# Patient Record
Sex: Male | Born: 1960 | Race: White | Hispanic: No | Smoking: Current every day smoker
Health system: Southern US, Community
[De-identification: ages and names within clinical notes are randomized; demographics above are authoritative.]

## PROBLEM LIST (undated history)

## (undated) DIAGNOSIS — I509 Heart failure, unspecified: Secondary | ICD-10-CM

## (undated) DIAGNOSIS — K602 Anal fissure, unspecified: Secondary | ICD-10-CM

## (undated) DIAGNOSIS — Z860100 Personal history of colon polyps, unspecified: Secondary | ICD-10-CM

## (undated) DIAGNOSIS — R011 Cardiac murmur, unspecified: Secondary | ICD-10-CM

## (undated) DIAGNOSIS — R591 Generalized enlarged lymph nodes: Secondary | ICD-10-CM

## (undated) DIAGNOSIS — I10 Essential (primary) hypertension: Secondary | ICD-10-CM

## (undated) DIAGNOSIS — I4891 Unspecified atrial fibrillation: Secondary | ICD-10-CM

## (undated) DIAGNOSIS — L89159 Pressure ulcer of sacral region, unspecified stage: Secondary | ICD-10-CM

## (undated) HISTORY — DX: Personal history of colonic polyps: Z86.010

## (undated) HISTORY — DX: Essential (primary) hypertension: I10

## (undated) HISTORY — DX: Anal fissure, unspecified: K60.2

## (undated) HISTORY — DX: Cardiac murmur, unspecified: R01.1

## (undated) HISTORY — DX: Personal history of colon polyps, unspecified: Z86.0100

---

## 2008-12-13 HISTORY — PX: OTHER SURGICAL HISTORY: SHX169

## 2009-05-15 ENCOUNTER — Ambulatory Visit: Payer: Self-pay | Admitting: General Surgery

## 2009-05-27 ENCOUNTER — Ambulatory Visit: Payer: Self-pay | Admitting: Unknown Physician Specialty

## 2009-08-15 ENCOUNTER — Ambulatory Visit: Payer: Self-pay | Admitting: General Surgery

## 2009-08-21 ENCOUNTER — Ambulatory Visit: Payer: Self-pay | Admitting: General Surgery

## 2010-10-06 ENCOUNTER — Ambulatory Visit: Payer: Self-pay | Admitting: Specialist

## 2010-12-13 HISTORY — PX: HERNIA REPAIR: SHX51

## 2010-12-13 HISTORY — PX: COLONOSCOPY: SHX174

## 2011-06-03 ENCOUNTER — Ambulatory Visit: Payer: Self-pay | Admitting: General Surgery

## 2011-06-09 ENCOUNTER — Ambulatory Visit: Payer: Self-pay | Admitting: General Surgery

## 2011-06-10 LAB — PATHOLOGY REPORT

## 2013-01-17 ENCOUNTER — Ambulatory Visit: Payer: Self-pay | Admitting: Family Medicine

## 2013-02-26 ENCOUNTER — Ambulatory Visit: Payer: Self-pay | Admitting: Internal Medicine

## 2013-02-26 LAB — COMPREHENSIVE METABOLIC PANEL
Albumin: 3.7 g/dL (ref 3.4–5.0)
Alkaline Phosphatase: 90 U/L (ref 50–136)
Anion Gap: 5 — ABNORMAL LOW (ref 7–16)
BUN: 14 mg/dL (ref 7–18)
Bilirubin,Total: 0.8 mg/dL (ref 0.2–1.0)
Calcium, Total: 8.9 mg/dL (ref 8.5–10.1)
Chloride: 98 mmol/L (ref 98–107)
Co2: 31 mmol/L (ref 21–32)
Creatinine: 0.78 mg/dL (ref 0.60–1.30)
EGFR (African American): >60
EGFR (Non-African Amer.): >60
Glucose: 166 mg/dL — ABNORMAL HIGH (ref 65–99)
Osmolality: 272 (ref 275–301)
Potassium: 4.4 mmol/L (ref 3.5–5.1)
SGOT(AST): 83 U/L — ABNORMAL HIGH (ref 15–37)
SGPT (ALT): 127 U/L — ABNORMAL HIGH (ref 12–78)
Sodium: 134 mmol/L — ABNORMAL LOW (ref 136–145)
Total Protein: 7.5 g/dL (ref 6.4–8.2)

## 2013-02-26 LAB — CBC WITH DIFFERENTIAL/PLATELET
Basophil #: 0.1 10*3/uL (ref 0.0–0.1)
Basophil %: 1.1 %
Eosinophil #: 0.1 10*3/uL (ref 0.0–0.7)
Eosinophil %: 1 %
HCT: 46.5 % (ref 40.0–52.0)
HGB: 16 g/dL (ref 13.0–18.0)
Lymphocyte #: 1.4 10*3/uL (ref 1.0–3.6)
Lymphocyte %: 20.2 %
MCH: 34.2 pg — ABNORMAL HIGH (ref 26.0–34.0)
MCHC: 34.5 g/dL (ref 32.0–36.0)
MCV: 99 fL (ref 80–100)
Monocyte #: 0.6 x10 3/mm (ref 0.2–1.0)
Monocyte %: 8.8 %
Neutrophil #: 4.7 10*3/uL (ref 1.4–6.5)
Neutrophil %: 68.9 %
Platelet: 134 10*3/uL — ABNORMAL LOW (ref 150–440)
RBC: 4.69 10*6/uL (ref 4.40–5.90)
RDW: 12.3 % (ref 11.5–14.5)
WBC: 6.8 10*3/uL (ref 3.8–10.6)

## 2013-02-26 LAB — LIPID PANEL
Cholesterol: 194 mg/dL (ref 0–200)
HDL Cholesterol: 35 mg/dL — ABNORMAL LOW (ref 40–60)
Ldl Cholesterol, Calc: 88 mg/dL (ref 0–100)
Triglycerides: 353 mg/dL — ABNORMAL HIGH (ref 0–200)
VLDL Cholesterol, Calc: 71 mg/dL — ABNORMAL HIGH (ref 5–40)

## 2013-02-26 LAB — TSH: Thyroid Stimulating Horm: 1.12 u[IU]/mL

## 2013-07-04 ENCOUNTER — Encounter: Payer: Self-pay | Admitting: *Deleted

## 2014-05-03 ENCOUNTER — Ambulatory Visit: Payer: Self-pay | Admitting: Internal Medicine

## 2016-01-30 ENCOUNTER — Encounter: Payer: Self-pay | Admitting: Emergency Medicine

## 2016-01-30 ENCOUNTER — Emergency Department
Admission: EM | Admit: 2016-01-30 | Discharge: 2016-01-30 | Disposition: A | Discharge status: Home / Self Care | Payer: BC Managed Care – PPO | Attending: Emergency Medicine | Admitting: Emergency Medicine

## 2016-01-30 DIAGNOSIS — L03116 Cellulitis of left lower limb: Secondary | ICD-10-CM

## 2016-01-30 DIAGNOSIS — F17200 Nicotine dependence, unspecified, uncomplicated: Secondary | ICD-10-CM | POA: Diagnosis not present

## 2016-01-30 DIAGNOSIS — I10 Essential (primary) hypertension: Secondary | ICD-10-CM | POA: Diagnosis not present

## 2016-01-30 DIAGNOSIS — R2242 Localized swelling, mass and lump, left lower limb: Secondary | ICD-10-CM | POA: Diagnosis present

## 2016-01-30 LAB — CBC
HCT: 41 % (ref 40.0–52.0)
HEMOGLOBIN: 14.2 g/dL (ref 13.0–18.0)
MCH: 35.8 pg — AB (ref 26.0–34.0)
MCHC: 34.7 g/dL (ref 32.0–36.0)
MCV: 103.3 fL — ABNORMAL HIGH (ref 80.0–100.0)
PLATELETS: 116 10*3/uL — AB (ref 150–440)
RBC: 3.97 MIL/uL — AB (ref 4.40–5.90)
RDW: 15 % — ABNORMAL HIGH (ref 11.5–14.5)
WBC: 6.8 10*3/uL (ref 3.8–10.6)

## 2016-01-30 LAB — COMPREHENSIVE METABOLIC PANEL
ALBUMIN: 3.5 g/dL (ref 3.5–5.0)
ALK PHOS: 139 U/L — AB (ref 38–126)
ALT: 44 U/L (ref 17–63)
AST: 101 U/L — AB (ref 15–41)
Anion gap: 11 (ref 5–15)
CALCIUM: 8.7 mg/dL — AB (ref 8.9–10.3)
CHLORIDE: 86 mmol/L — AB (ref 101–111)
CO2: 29 mmol/L (ref 22–32)
Creatinine, Ser: 0.53 mg/dL — ABNORMAL LOW (ref 0.61–1.24)
GFR calc non Af Amer: >60 mL/min (ref >60)
GLUCOSE: 147 mg/dL — AB (ref 65–99)
Potassium: 4.2 mmol/L (ref 3.5–5.1)
SODIUM: 126 mmol/L — AB (ref 135–145)
Total Bilirubin: 0.9 mg/dL (ref 0.3–1.2)
Total Protein: 7.5 g/dL (ref 6.5–8.1)

## 2016-01-30 MED ORDER — CLINDAMYCIN PHOSPHATE 600 MG/50ML IV SOLN
600.0000 mg | Freq: Once | INTRAVENOUS | Status: AC
  Administered 2016-01-30: 600 mg via INTRAVENOUS
  Filled 2016-01-30 (×2): qty 50

## 2016-01-30 MED ORDER — SODIUM CHLORIDE 0.9 % IV BOLUS (SEPSIS)
1000.0000 mL | Freq: Once | INTRAVENOUS | Status: AC
  Administered 2016-01-30: 1000 mL via INTRAVENOUS

## 2016-01-30 MED ORDER — CLINDAMYCIN HCL 300 MG PO CAPS: 300.0000 mg | ORAL_CAPSULE | Freq: Four times a day (QID) | ORAL | Status: DC

## 2016-01-30 NOTE — ED Provider Notes (Signed)
[facility] Emergency Department Provider Note    ____________________________________________  Time seen: ~1535   I have reviewed the triage vital signs and the nursing notes.   HISTORY  Chief Complaint Leg Swelling   History limited by: Not Limited   HPI [name] is a 55 y.o. male who presents to the emergency department today because of concerns for left leg swelling and redness. The patient states that he first started noticing some redness roughly 2 weeks ago. Discovered after he scratched his leg. It is progressively gotten worse. His also noticed some swelling that has started to go down towards his foot. He states he has some mild pain however it is okay to walk on. He went to his primary care doctor 2 days ago and was put on an oral antibiotic. He states that he feels this is not started helping. He denies any fevers, nausea or vomiting.    Past Medical History  Diagnosis Date  . Hypertension   . Heart murmur   . Anal fissure   . Personal history of colonic polyps     There are no active problems to display for this patient.   Past Surgical History  Procedure Laterality Date  . Hernia repair  2012  . Colonoscopy  2012  . Larynx-amyloidosis-laser surgery   2010    No current outpatient prescriptions on file.  Allergies Review of patient's allergies indicates no known allergies.  No family history on file.  Social History Social History  Substance Use Topics  . Smoking status: Current Every Day Smoker -- 1.00 packs/day for 20 years  . Smokeless tobacco: Never Used  . Alcohol Use: Yes    Review of Systems  Constitutional: Negative for fever. Cardiovascular: Negative for chest pain. Respiratory: Negative for shortness of breath. Gastrointestinal: Negative for abdominal pain, vomiting and diarrhea. Skin: Positive for redness and swelling to left lower leg Neurological: Negative for headaches, focal weakness or  numbness.   10-point ROS otherwise negative.  ____________________________________________   PHYSICAL EXAM:  VITAL SIGNS: ED Triage Vitals  Enc Vitals Group     BP 01/30/16 1350 126/76 mmHg     Pulse Rate 01/30/16 1350 68     Resp 01/30/16 1350 20     Temp 01/30/16 1350 97.8 F (36.6 C)     Temp Source 01/30/16 1350 Oral     SpO2 01/30/16 1350 95 %     Weight 01/30/16 1350 340 lb (154.223 kg)     Height 01/30/16 1350  (1.93 m)   Constitutional: Alert and oriented. Well appearing and in no distress. Eyes: Conjunctivae are normal. PERRL. Normal extraocular movements. ENT   Head: Normocephalic and atraumatic.   Nose: No congestion/rhinnorhea.   Mouth/Throat: Mucous membranes are moist.   Neck: No stridor. Hematological/Lymphatic/Immunilogical: No cervical lymphadenopathy. Cardiovascular: Normal rate, regular rhythm.  No murmurs, rubs, or gallops. Respiratory: Normal respiratory effort without tachypnea nor retractions. Breath sounds are clear and equal bilaterally. No wheezes/rales/rhonchi. Gastrointestinal: Soft and nontender. No distention.  Genitourinary: Deferred Musculoskeletal: Normal range of motion in all extremities. No joint effusions.  No lower extremity tenderness nor edema. Neurologic:  Normal speech and language. No gross focal neurologic deficits are appreciated.  Skin:  Left leg with some redness and swelling to the left mid lower leg and left anterior foot. Minimally tender to palpation. No crepitus felt. Mild amount of oozing from site of wound mid shin. Psychiatric: Mood and affect are normal. Speech and behavior are normal.  Patient exhibits appropriate insight and judgment.  ____________________________________________    LABS (pertinent positives/negatives)  Labs Reviewed  CBC - Abnormal; Notable for the following:    RBC 3.97 (*)    MCV 103.3 (*)    MCH 35.8 (*)    RDW 15.0 (*)    Platelets 116 (*)    All other components within  normal limits  COMPREHENSIVE METABOLIC PANEL - Abnormal; Notable for the following:    Sodium 126 (*)    Chloride 86 (*)    Glucose, Bld 147 (*)    BUN <5 (*)    Creatinine, Ser 0.53 (*)    Calcium 8.7 (*)    AST 101 (*)    Alkaline Phosphatase 139 (*)    All other components within normal limits     ____________________________________________   EKG  None  ____________________________________________    RADIOLOGY  None  ____________________________________________   PROCEDURES  Procedure(s) performed: None  Critical Care performed: No  ____________________________________________   INITIAL IMPRESSION / ASSESSMENT AND PLAN / ED COURSE  Pertinent labs & imaging results that were available during my care of the patient were reviewed by me and considered in my medical decision making (see chart for details).  Patient presented to the emergency department today because of concerns for left leg redness and swelling. Exam is consistent with cellulitis. Patient has been on oral Keflex. I discussed and offered patient admission for IV antibiotics however patient declined wanting to try IV antibiotic here in the emergency department and switched to a medication with further coverage. Given the patient is without leukocytosis and vital signs are normal we can try switching oral antibiotics. Additionally I discussed with patient value of wound care center.  ____________________________________________   FINAL CLINICAL IMPRESSION(S) / ED DIAGNOSES  Final diagnoses:  Cellulitis of left lower extremity     Phineas Semen, MD 01/30/16 1757

## 2016-01-30 NOTE — ED Notes (Signed)
Pt sts leg swelling and reddness began x 2 wks ago after scratching leg.  Pt sts he saw PCP Wednesday and was given antibiotics.  Pt sts that redness and swelling has spread to L foot.

## 2016-01-30 NOTE — Discharge Instructions (Signed)
Please seek medical attention for any high fevers, chest pain, shortness of breath, change in behavior, persistent vomiting, bloody stool or any other new or concerning symptoms. ° ° °Cellulitis °Cellulitis is an infection of the skin and the tissue beneath it. The infected area is usually red and tender. Cellulitis occurs most often in the arms and lower legs.  °CAUSES  °Cellulitis is caused by bacteria that enter the skin through cracks or cuts in the skin. The most common types of bacteria that cause cellulitis are staphylococci and streptococci. °SIGNS AND SYMPTOMS  °· Redness and warmth. °· Swelling. °· Tenderness or pain. °· Fever. °DIAGNOSIS  °Your health care provider can usually determine what is wrong based on a physical exam. Blood tests may also be done. °TREATMENT  °Treatment usually involves taking an antibiotic medicine. °HOME CARE INSTRUCTIONS  °· Take your antibiotic medicine as directed by your health care provider. Finish the antibiotic even if you start to feel better. °· Keep the infected arm or leg elevated to reduce swelling. °· Apply a warm cloth to the affected area up to 4 times per day to relieve pain. °· Take medicines only as directed by your health care provider. °· Keep all follow-up visits as directed by your health care provider. °SEEK MEDICAL CARE IF:  °· You notice red streaks coming from the infected area. °· Your red area gets larger or turns dark in color. °· Your bone or joint underneath the infected area becomes painful after the skin has healed. °· Your infection returns in the same area or another area. °· You notice a swollen bump in the infected area. °· You develop new symptoms. °· You have a fever. °SEEK IMMEDIATE MEDICAL CARE IF:  °· You feel very sleepy. °· You develop vomiting or diarrhea. °· You have a general ill feeling (malaise) with muscle aches and pains. °  °This information is not intended to replace advice given to you by your health care provider. Make sure  you discuss any questions you have with your health care provider. °  °Document Released: 09/08/2005 Document Revised: 08/20/2015 Document Reviewed: 02/14/2012 °Elsevier Interactive Patient Education ©2016 [employer] ° °

## 2016-01-30 NOTE — ED Notes (Signed)
Pt presents with bilateral leg swelling and redness getting worse for one week.

## 2016-02-11 ENCOUNTER — Encounter: Payer: Self-pay | Admitting: *Deleted

## 2016-02-17 ENCOUNTER — Ambulatory Visit (INDEPENDENT_AMBULATORY_CARE_PROVIDER_SITE_OTHER): Payer: BC Managed Care – PPO | Admitting: General Surgery

## 2016-02-17 ENCOUNTER — Encounter: Payer: Self-pay | Admitting: General Surgery

## 2016-02-17 VITALS — BP 148/90 | HR 88 | Resp 16 | Ht 73.0 in | Wt 331.0 lb

## 2016-02-17 DIAGNOSIS — I8311 Varicose veins of right lower extremity with inflammation: Secondary | ICD-10-CM | POA: Diagnosis not present

## 2016-02-17 DIAGNOSIS — I8312 Varicose veins of left lower extremity with inflammation: Secondary | ICD-10-CM

## 2016-02-17 DIAGNOSIS — L97919 Non-pressure chronic ulcer of unspecified part of right lower leg with unspecified severity: Principal | ICD-10-CM

## 2016-02-17 DIAGNOSIS — I83019 Varicose veins of right lower extremity with ulcer of unspecified site: Secondary | ICD-10-CM | POA: Diagnosis not present

## 2016-02-17 DIAGNOSIS — I872 Venous insufficiency (chronic) (peripheral): Secondary | ICD-10-CM

## 2016-02-17 NOTE — Patient Instructions (Signed)
Una boot with compression. Follow up in 1 week nurse una boot change. Follow up with provider in 3 weeks for reevaluation. Advised to stop taking clindamycin

## 2016-02-17 NOTE — Progress Notes (Signed)
Patient ID: [name] RouteMichael J Mimbs, male   DOB: 23-Jan-1961, 55 y.o.   MRN: [id number]  Chief Complaint  Patient presents with  . Other    rigth leg cellulitis    HPI [name] is a 55 y.o. male here today for a evaluation of rigth leg cellulitis.Patient states he hit his right leg about two months ago. He also has had seepage of fluid from both legs, bilateral leg swelling. No history of DVT. I have reviewed the history of present illness with the patient. HPI  Past Medical History  Diagnosis Date  . Hypertension   . Heart murmur   . Anal fissure   . Personal history of colonic polyps     Past Surgical History  Procedure Laterality Date  . Hernia repair  2012  . Colonoscopy  2012  . Larynx-amyloidosis-laser surgery   2010    History reviewed. No pertinent family history.  Social History Social History  Substance Use Topics  . Smoking status: Current Every Day Smoker -- 1.00 packs/day for 20 years  . Smokeless tobacco: Never Used  . Alcohol Use: Yes    No Known Allergies  Current Outpatient Prescriptions  Medication Sig Dispense Refill  . amLODipine (NORVASC) 10 MG tablet     . benazepril (LOTENSIN) 40 MG tablet     . clindamycin (CLEOCIN) 300 MG capsule Take 1 capsule (300 mg total) by mouth 4 (four) times daily. 40 capsule 0  . montelukast (SINGULAIR) 10 MG tablet Take 10 mg by mouth at bedtime.    Marland Kitchen. nystatin cream (MYCOSTATIN)     . nystatin-triamcinolone ointment (MYCOLOG)     . omeprazole (PRILOSEC) 20 MG capsule Take 20 mg by mouth daily.    . sotalol (BETAPACE) 80 MG tablet     . XARELTO 20 MG TABS tablet     . cephALEXin (KEFLEX) 500 MG capsule Reported on 02/17/2016     No current facility-administered medications for this visit.    Review of Systems Review of Systems  Constitutional: Negative.   Respiratory: Negative.   Cardiovascular: Negative.     Blood pressure 148/90, pulse 88, resp. rate 16, height 6\' 1"  (1.854 m), weight 331 lb (150.141  kg).  Physical Exam Physical Exam  Constitutional: He is oriented to person, place, and time. He appears well-developed and well-nourished.  Eyes: Conjunctivae are normal. No scleral icterus.  Neck: Neck supple.  Cardiovascular: Normal rate, regular rhythm and normal heart sounds.   Pulses:      Dorsalis pedis pulses are 2+ on the right side, and 2+ on the left side.  Pulmonary/Chest: Effort normal and breath sounds normal.  Abdominal: Soft. Bowel sounds are normal.  Neurological: He is alert and oriented to person, place, and time.  Skin: Skin is warm and dry.  Skin color changes bilaterally from knee down. Weeping ulcers bilaterally mid calf. Pitting edema bilaterally.   Psychiatric: His behavior is normal.  2cm x 1 cm wide t irregularly shaped ulcer rght leg mid anterior  Data Reviewed Notes reviewed  Assessment    Stasis dermatitis bilateral, fairly severe. Stasis ulcer right leg  Findings discussed with pt. Once the process is under control will switch to use of compression stckings.   Plan    Una boot with compression. Follow up in weekly for nurse una boot change. Follow up with provider in 3 weeks for reevaluation. Advised to stop taking antibitics     PCP:  Corky DownsMasoud, [name] This information has been scribed by  Ples Specter CMA.    Jaquitta Dupriest G 02/17/2016, 4:45 PM

## 2016-02-18 ENCOUNTER — Ambulatory Visit: Payer: Self-pay | Admitting: General Surgery

## 2016-02-24 ENCOUNTER — Ambulatory Visit (INDEPENDENT_AMBULATORY_CARE_PROVIDER_SITE_OTHER): Payer: BC Managed Care – PPO | Admitting: *Deleted

## 2016-02-24 DIAGNOSIS — I83019 Varicose veins of right lower extremity with ulcer of unspecified site: Secondary | ICD-10-CM

## 2016-02-24 DIAGNOSIS — I8311 Varicose veins of right lower extremity with inflammation: Secondary | ICD-10-CM

## 2016-02-24 DIAGNOSIS — I8312 Varicose veins of left lower extremity with inflammation: Secondary | ICD-10-CM

## 2016-02-24 DIAGNOSIS — I872 Venous insufficiency (chronic) (peripheral): Secondary | ICD-10-CM

## 2016-02-24 DIAGNOSIS — L97919 Non-pressure chronic ulcer of unspecified part of right lower leg with unspecified severity: Principal | ICD-10-CM

## 2016-02-24 NOTE — Progress Notes (Signed)
The patient came in today for unna boot dressing change.  Both legs were washed with soap and water.  Unna boots, kerlix and coban applied.  Edema improving some.The area of concern is improving. Follow up as scheduled.   Skin color changes bilaterally from knee down. Weeping bilaterally mid calf. Pitting edema bilaterally.  2cm x 1 cm wide  irregularly shaped ulcer rght leg mid anterior unchanged.

## 2016-02-24 NOTE — Patient Instructions (Signed)
The patient is aware to call back for any questions or concerns.  

## 2016-03-02 ENCOUNTER — Ambulatory Visit (INDEPENDENT_AMBULATORY_CARE_PROVIDER_SITE_OTHER): Payer: BC Managed Care – PPO | Admitting: *Deleted

## 2016-03-02 DIAGNOSIS — I8311 Varicose veins of right lower extremity with inflammation: Secondary | ICD-10-CM | POA: Diagnosis not present

## 2016-03-02 DIAGNOSIS — I8312 Varicose veins of left lower extremity with inflammation: Secondary | ICD-10-CM

## 2016-03-02 DIAGNOSIS — I872 Venous insufficiency (chronic) (peripheral): Secondary | ICD-10-CM

## 2016-03-02 NOTE — Patient Instructions (Signed)
The patient is aware to call back for any questions or concerns.  

## 2016-03-02 NOTE — Progress Notes (Signed)
The patient came in today for unna boot dressing change.  Both legs were washed with soap and water.  Unna boots, kerlix and coban applied.  Edema improving.The area of concern has shown some improvment. Legs still weeping some but not as much. Follow up as scheduled.

## 2016-03-09 ENCOUNTER — Ambulatory Visit (INDEPENDENT_AMBULATORY_CARE_PROVIDER_SITE_OTHER): Payer: BC Managed Care – PPO | Admitting: General Surgery

## 2016-03-09 ENCOUNTER — Encounter: Payer: Self-pay | Admitting: General Surgery

## 2016-03-09 VITALS — BP 136/88 | HR 72 | Resp 16 | Ht 76.0 in | Wt 329.0 lb

## 2016-03-09 DIAGNOSIS — I8312 Varicose veins of left lower extremity with inflammation: Secondary | ICD-10-CM

## 2016-03-09 DIAGNOSIS — I8311 Varicose veins of right lower extremity with inflammation: Secondary | ICD-10-CM

## 2016-03-09 DIAGNOSIS — I872 Venous insufficiency (chronic) (peripheral): Secondary | ICD-10-CM

## 2016-03-09 NOTE — Progress Notes (Signed)
  Here today for follow up stasis ulcers and unna boots. Pain is mostly around left ankle, right leg is ok. I have reviewed the history of present illness with the patient.   Notable improvement since last evaluation with less seepage on both sides and clearing of some of the tiny ulcerations. Edema is likewise less. He does have significant nail atrophy and possbile fungal involvement, needs podiatry to evaluate.Contiunue unna boots. Follow up in 3 weeks.  PCP:  Corky DownsMasoud, [name] This information has been scribed by Dorathy DaftMarsha Hatch RN, BSN,BC.

## 2016-03-09 NOTE — Patient Instructions (Signed)
Patient to return in one week nurse  

## 2016-03-10 ENCOUNTER — Telehealth: Payer: Self-pay | Admitting: *Deleted

## 2016-03-10 ENCOUNTER — Encounter: Payer: Self-pay | Admitting: General Surgery

## 2016-03-10 NOTE — Telephone Encounter (Signed)
Patient is aware of instructions for appointment however he is going to call them to see if he can get a later time.

## 2016-03-10 NOTE — Telephone Encounter (Signed)
Message for patient to call the office.   Patient has been scheduled for an appointment with [facility] for 03-11-16 at 1:45 pm. This patient will need to take photo ID, insurance card, copay, and medications to this appointment.

## 2016-03-16 ENCOUNTER — Ambulatory Visit (INDEPENDENT_AMBULATORY_CARE_PROVIDER_SITE_OTHER): Payer: BC Managed Care – PPO | Admitting: *Deleted

## 2016-03-16 DIAGNOSIS — I8311 Varicose veins of right lower extremity with inflammation: Secondary | ICD-10-CM

## 2016-03-16 DIAGNOSIS — I8312 Varicose veins of left lower extremity with inflammation: Secondary | ICD-10-CM

## 2016-03-16 DIAGNOSIS — I872 Venous insufficiency (chronic) (peripheral): Secondary | ICD-10-CM

## 2016-03-16 NOTE — Patient Instructions (Signed)
The patient is aware to call back for any questions or concerns.  

## 2016-03-16 NOTE — Progress Notes (Signed)
The patient came in today for unna boot dressing change.  Both legs were washed with soap and water.  Unna boots, kerlix and coban applied.  Edema improving.The area of concern is improving. Drainage is less. Follow up as scheduled.

## 2016-03-25 ENCOUNTER — Ambulatory Visit (INDEPENDENT_AMBULATORY_CARE_PROVIDER_SITE_OTHER): Payer: BC Managed Care – PPO | Admitting: *Deleted

## 2016-03-25 DIAGNOSIS — I8312 Varicose veins of left lower extremity with inflammation: Secondary | ICD-10-CM

## 2016-03-25 DIAGNOSIS — I8311 Varicose veins of right lower extremity with inflammation: Secondary | ICD-10-CM | POA: Diagnosis not present

## 2016-03-25 DIAGNOSIS — I872 Venous insufficiency (chronic) (peripheral): Secondary | ICD-10-CM

## 2016-03-25 NOTE — Patient Instructions (Signed)
The patient is aware to call back for any questions or concerns.  

## 2016-03-25 NOTE — Progress Notes (Signed)
The patient came in today for unna boot dressing change.  Both legs were washed with soap and water.  Unna boots, kerlix and coban applied.  Edema improving.The area of concern is improving. Follow up as scheduled.

## 2016-03-30 ENCOUNTER — Encounter: Payer: Self-pay | Admitting: General Surgery

## 2016-03-30 ENCOUNTER — Ambulatory Visit (INDEPENDENT_AMBULATORY_CARE_PROVIDER_SITE_OTHER): Payer: BC Managed Care – PPO | Admitting: General Surgery

## 2016-03-30 VITALS — BP 130/72 | HR 76 | Resp 12 | Ht 76.0 in | Wt 331.0 lb

## 2016-03-30 DIAGNOSIS — I83019 Varicose veins of right lower extremity with ulcer of unspecified site: Secondary | ICD-10-CM

## 2016-03-30 DIAGNOSIS — L97919 Non-pressure chronic ulcer of unspecified part of right lower leg with unspecified severity: Secondary | ICD-10-CM

## 2016-03-30 DIAGNOSIS — I8312 Varicose veins of left lower extremity with inflammation: Secondary | ICD-10-CM

## 2016-03-30 DIAGNOSIS — I8311 Varicose veins of right lower extremity with inflammation: Secondary | ICD-10-CM | POA: Diagnosis not present

## 2016-03-30 DIAGNOSIS — I83029 Varicose veins of left lower extremity with ulcer of unspecified site: Secondary | ICD-10-CM | POA: Diagnosis not present

## 2016-03-30 DIAGNOSIS — I872 Venous insufficiency (chronic) (peripheral): Secondary | ICD-10-CM

## 2016-03-30 DIAGNOSIS — L97929 Non-pressure chronic ulcer of unspecified part of left lower leg with unspecified severity: Secondary | ICD-10-CM

## 2016-03-30 NOTE — Patient Instructions (Signed)
The patient is aware to call back for any questions or concerns.  

## 2016-03-30 NOTE — Progress Notes (Signed)
Here today for follow up stasis ulcers bilateral lower extremity. He states the left leg still hurts some. Overall his legs do feel better. I have reviewed the history of present illness with the patient.   Exam- Weeping of clear edema fluid is not seen on right leg. Left side with some seepage from lower third but markedly diminished from initial encounter. 6mm clean ulcer anterior rt leg. 2 by 2 cm irregular superficial ulcer left anterior leg. No signs of infection. Overall edema is significantly less. Slow but steady improvement. Continuew ith una boot and compression.  Culture taken form left posterior ankle region. Follow up nurse one week for unna boot and MD in 4 weeks.  PCP:  Masoud,[name]  This information has been scribed by Dorathy DaftMarsha Hatch RN, BSN,BC.

## 2016-03-31 ENCOUNTER — Encounter: Payer: Self-pay | Admitting: General Surgery

## 2016-04-05 LAB — ANAEROBIC AND AEROBIC CULTURE

## 2016-04-06 ENCOUNTER — Ambulatory Visit (INDEPENDENT_AMBULATORY_CARE_PROVIDER_SITE_OTHER): Payer: BC Managed Care – PPO | Admitting: *Deleted

## 2016-04-06 DIAGNOSIS — L97919 Non-pressure chronic ulcer of unspecified part of right lower leg with unspecified severity: Secondary | ICD-10-CM

## 2016-04-06 DIAGNOSIS — I83019 Varicose veins of right lower extremity with ulcer of unspecified site: Secondary | ICD-10-CM

## 2016-04-06 DIAGNOSIS — I83029 Varicose veins of left lower extremity with ulcer of unspecified site: Secondary | ICD-10-CM | POA: Diagnosis not present

## 2016-04-06 DIAGNOSIS — L97929 Non-pressure chronic ulcer of unspecified part of left lower leg with unspecified severity: Principal | ICD-10-CM

## 2016-04-06 MED ORDER — AMPICILLIN 500 MG PO CAPS: 500.0000 mg | ORAL_CAPSULE | Freq: Four times a day (QID) | ORAL | Status: DC

## 2016-04-06 NOTE — Patient Instructions (Signed)
The patient is aware to call back for any questions or concerns.  

## 2016-04-06 NOTE — Progress Notes (Signed)
The patient came in today for unna boot dressing change.  Both legs were washed with soap and water.   Edema remains.The area of concern is same. Dry gauze applied, ATB sent in per Dr Evette CristalSankar based on culture. Follow up as scheduled. Patient will call back on Thursday with an update and/or for unna boot.

## 2016-04-14 ENCOUNTER — Ambulatory Visit (INDEPENDENT_AMBULATORY_CARE_PROVIDER_SITE_OTHER): Payer: BC Managed Care – PPO | Admitting: General Surgery

## 2016-04-14 DIAGNOSIS — L97919 Non-pressure chronic ulcer of unspecified part of right lower leg with unspecified severity: Secondary | ICD-10-CM

## 2016-04-14 DIAGNOSIS — I83019 Varicose veins of right lower extremity with ulcer of unspecified site: Secondary | ICD-10-CM | POA: Diagnosis not present

## 2016-04-14 DIAGNOSIS — L97929 Non-pressure chronic ulcer of unspecified part of left lower leg with unspecified severity: Principal | ICD-10-CM

## 2016-04-14 DIAGNOSIS — I83029 Varicose veins of left lower extremity with ulcer of unspecified site: Secondary | ICD-10-CM | POA: Diagnosis not present

## 2016-04-14 NOTE — Progress Notes (Signed)
Here today for wound check. He states the areas are still draining. He states he has 2 days of antibiotics left.   Left, lower leg has a clean venous ulcer. Edema in both legs persist with a lot of peeling and dry skin in both feet. Left leg in the lower third is still draining clear fluid Overall, his condition is minimally improved. He seemed to do fairly well with una boot and compression At this point would like another opinion from vascular surgery Neomia DearUna boot was replaced today. Follow up after consult with vascular surgery.  Patient to see Dr. Gilda CreaseSchnier 04/15/16.        PCP:  Rueben BashMasoud, Jeved This information has been scribed by Dorathy DaftMarsha Hatch RN, BSN,BC.

## 2016-04-14 NOTE — Patient Instructions (Signed)
The patient is aware to call back for any questions or concerns.  

## 2016-04-15 ENCOUNTER — Encounter: Payer: Self-pay | Admitting: General Surgery

## 2016-04-17 ENCOUNTER — Encounter: Payer: Self-pay | Admitting: Emergency Medicine

## 2016-04-17 ENCOUNTER — Inpatient Hospital Stay
Admission: EM | Admit: 2016-04-17 | Discharge: 2016-04-19 | DRG: 593 | Disposition: A | Discharge status: Home / Self Care | Payer: BC Managed Care – PPO | Attending: Specialist | Admitting: Specialist

## 2016-04-17 DIAGNOSIS — Z7901 Long term (current) use of anticoagulants: Secondary | ICD-10-CM | POA: Diagnosis not present

## 2016-04-17 DIAGNOSIS — Z881 Allergy status to other antibiotic agents status: Secondary | ICD-10-CM

## 2016-04-17 DIAGNOSIS — I83019 Varicose veins of right lower extremity with ulcer of unspecified site: Secondary | ICD-10-CM | POA: Diagnosis present

## 2016-04-17 DIAGNOSIS — I10 Essential (primary) hypertension: Secondary | ICD-10-CM | POA: Diagnosis present

## 2016-04-17 DIAGNOSIS — K219 Gastro-esophageal reflux disease without esophagitis: Secondary | ICD-10-CM | POA: Diagnosis present

## 2016-04-17 DIAGNOSIS — R600 Localized edema: Secondary | ICD-10-CM | POA: Diagnosis present

## 2016-04-17 DIAGNOSIS — I890 Lymphedema, not elsewhere classified: Secondary | ICD-10-CM | POA: Diagnosis present

## 2016-04-17 DIAGNOSIS — F17210 Nicotine dependence, cigarettes, uncomplicated: Secondary | ICD-10-CM | POA: Diagnosis present

## 2016-04-17 DIAGNOSIS — L97929 Non-pressure chronic ulcer of unspecified part of left lower leg with unspecified severity: Secondary | ICD-10-CM | POA: Diagnosis present

## 2016-04-17 DIAGNOSIS — I83029 Varicose veins of left lower extremity with ulcer of unspecified site: Secondary | ICD-10-CM | POA: Diagnosis present

## 2016-04-17 DIAGNOSIS — S91311A Laceration without foreign body, right foot, initial encounter: Secondary | ICD-10-CM | POA: Diagnosis present

## 2016-04-17 DIAGNOSIS — Z79899 Other long term (current) drug therapy: Secondary | ICD-10-CM

## 2016-04-17 DIAGNOSIS — L97919 Non-pressure chronic ulcer of unspecified part of right lower leg with unspecified severity: Secondary | ICD-10-CM | POA: Diagnosis present

## 2016-04-17 DIAGNOSIS — L03115 Cellulitis of right lower limb: Secondary | ICD-10-CM

## 2016-04-17 DIAGNOSIS — E871 Hypo-osmolality and hyponatremia: Secondary | ICD-10-CM | POA: Diagnosis present

## 2016-04-17 DIAGNOSIS — Z6837 Body mass index (BMI) 37.0-37.9, adult: Secondary | ICD-10-CM | POA: Diagnosis not present

## 2016-04-17 DIAGNOSIS — E669 Obesity, unspecified: Secondary | ICD-10-CM | POA: Diagnosis present

## 2016-04-17 DIAGNOSIS — I482 Chronic atrial fibrillation: Secondary | ICD-10-CM | POA: Diagnosis present

## 2016-04-17 DIAGNOSIS — R011 Cardiac murmur, unspecified: Secondary | ICD-10-CM | POA: Diagnosis present

## 2016-04-17 DIAGNOSIS — R58 Hemorrhage, not elsewhere classified: Secondary | ICD-10-CM

## 2016-04-17 DIAGNOSIS — I878 Other specified disorders of veins: Secondary | ICD-10-CM | POA: Diagnosis present

## 2016-04-17 LAB — CBC
HEMATOCRIT: 39.4 % — AB (ref 40.0–52.0)
Hemoglobin: 13.6 g/dL (ref 13.0–18.0)
MCH: 37 pg — AB (ref 26.0–34.0)
MCHC: 34.5 g/dL (ref 32.0–36.0)
MCV: 107.2 fL — ABNORMAL HIGH (ref 80.0–100.0)
Platelets: 193 10*3/uL (ref 150–440)
RBC: 3.68 MIL/uL — ABNORMAL LOW (ref 4.40–5.90)
RDW: 13.4 % (ref 11.5–14.5)
WBC: 11.8 10*3/uL — ABNORMAL HIGH (ref 3.8–10.6)

## 2016-04-17 LAB — BASIC METABOLIC PANEL
Anion gap: 12 (ref 5–15)
BUN: 12 mg/dL (ref 6–20)
CALCIUM: 8.5 mg/dL — AB (ref 8.9–10.3)
CHLORIDE: 87 mmol/L — AB (ref 101–111)
CO2: 22 mmol/L (ref 22–32)
CREATININE: 0.59 mg/dL — AB (ref 0.61–1.24)
GFR calc non Af Amer: >60 mL/min (ref >60)
Glucose, Bld: 109 mg/dL — ABNORMAL HIGH (ref 65–99)
Potassium: 4.4 mmol/L (ref 3.5–5.1)
Sodium: 121 mmol/L — ABNORMAL LOW (ref 135–145)

## 2016-04-17 LAB — TSH: TSH: 1.83 u[IU]/mL (ref 0.350–4.500)

## 2016-04-17 MED ORDER — OXYCODONE HCL 5 MG PO TABS
5.0000 mg | ORAL_TABLET | ORAL | Status: DC | PRN
  Administered 2016-04-18 – 2016-04-19 (×5): 5 mg via ORAL
  Filled 2016-04-17 (×6): qty 1

## 2016-04-17 MED ORDER — MONTELUKAST SODIUM 10 MG PO TABS
10.0000 mg | ORAL_TABLET | Freq: Every day | ORAL | Status: DC
  Filled 2016-04-17: qty 1

## 2016-04-17 MED ORDER — SOTALOL HCL 80 MG PO TABS
120.0000 mg | ORAL_TABLET | Freq: Two times a day (BID) | ORAL | Status: DC
  Administered 2016-04-18 – 2016-04-19 (×2): 120 mg via ORAL
  Filled 2016-04-17: qty 1
  Filled 2016-04-17 (×3): qty 2

## 2016-04-17 MED ORDER — RIVAROXABAN 20 MG PO TABS
20.0000 mg | ORAL_TABLET | Freq: Every day | ORAL | Status: DC
  Administered 2016-04-19: 20 mg via ORAL
  Filled 2016-04-17 (×2): qty 1

## 2016-04-17 MED ORDER — LIDOCAINE-EPINEPHRINE (PF) 1 %-1:200000 IJ SOLN
INTRAMUSCULAR | Status: AC
  Filled 2016-04-17: qty 30

## 2016-04-17 MED ORDER — CLINDAMYCIN PHOSPHATE 600 MG/50ML IV SOLN
600.0000 mg | Freq: Once | INTRAVENOUS | Status: AC
  Administered 2016-04-17: 600 mg via INTRAVENOUS
  Filled 2016-04-17: qty 50

## 2016-04-17 MED ORDER — ONDANSETRON HCL 4 MG PO TABS: 4.0000 mg | ORAL_TABLET | Freq: Four times a day (QID) | ORAL | Status: DC | PRN

## 2016-04-17 MED ORDER — ONDANSETRON HCL 4 MG/2ML IJ SOLN
4.0000 mg | Freq: Four times a day (QID) | INTRAMUSCULAR | Status: DC | PRN
Start: 2016-04-17 — End: 2016-04-19

## 2016-04-17 MED ORDER — FUROSEMIDE 10 MG/ML IJ SOLN
40.0000 mg | Freq: Once | INTRAMUSCULAR | Status: AC
  Administered 2016-04-17: 40 mg via INTRAVENOUS
  Filled 2016-04-17: qty 4

## 2016-04-17 MED ORDER — PANTOPRAZOLE SODIUM 40 MG PO TBEC
40.0000 mg | DELAYED_RELEASE_TABLET | Freq: Every day | ORAL | Status: DC
  Administered 2016-04-19: 40 mg via ORAL
  Filled 2016-04-17 (×2): qty 1

## 2016-04-17 MED ORDER — BENAZEPRIL HCL 20 MG PO TABS
40.0000 mg | ORAL_TABLET | Freq: Every day | ORAL | Status: DC
  Administered 2016-04-19: 40 mg via ORAL
  Filled 2016-04-17 (×2): qty 2

## 2016-04-17 MED ORDER — MORPHINE SULFATE (PF) 2 MG/ML IV SOLN: 2.0000 mg | INTRAVENOUS | Status: DC | PRN

## 2016-04-17 MED ORDER — AMLODIPINE BESYLATE 10 MG PO TABS
10.0000 mg | ORAL_TABLET | Freq: Every day | ORAL | Status: DC
  Administered 2016-04-19: 10 mg via ORAL
  Filled 2016-04-17 (×2): qty 1

## 2016-04-17 MED ORDER — SODIUM CHLORIDE 0.9 % IV BOLUS (SEPSIS)
500.0000 mL | Freq: Once | INTRAVENOUS | Status: AC
  Administered 2016-04-17: 500 mL via INTRAVENOUS

## 2016-04-17 MED ORDER — SOTALOL HCL 80 MG PO TABS: 80.0000 mg | ORAL_TABLET | Freq: Every day | ORAL | Status: DC

## 2016-04-17 MED ORDER — ACETAMINOPHEN 325 MG PO TABS
650.0000 mg | ORAL_TABLET | Freq: Four times a day (QID) | ORAL | Status: DC | PRN
  Administered 2016-04-17: 650 mg via ORAL
  Filled 2016-04-17: qty 2

## 2016-04-17 MED ORDER — ACETAMINOPHEN 650 MG RE SUPP: 650.0000 mg | Freq: Four times a day (QID) | RECTAL | Status: DC | PRN

## 2016-04-17 NOTE — ED Notes (Signed)
Pt presents to ED with bleeding to right lower leg and heel.  Bilateral Unna boots in place. Pt due to see Dr. Graciela HusbandsKlein on Monday for follow up.  EMS out to the house to reinforce with coban wrap.  Bleeding stained to right heel. Pt is on Xarelto.  Swelling noted to bilateral feet and legs.  Pt states he has not had any blood draining from legs previously.

## 2016-04-17 NOTE — ED Provider Notes (Addendum)
[facility] Emergency Department Provider Note  ____________________________________________  Time seen: Approximately 7:01 PM  I have reviewed the triage vital signs and the nursing notes.   HISTORY  Chief Complaint Foot Swelling and Wound Check    HPI [name] is a 55 y.o. male with a history of chronic bilateral lymphedema, on Xarelto, presenting for right lateral foot bleeding. The patient denies any trauma but states that he began to have bleeding that he could not get to stop even with pressure. He denies any shortness of breath or lightheadedness. The patient just completed a course of antibiotics for bilateral lower extreme cellulitis, and feels that his legs are not better nor they worse since then. He is not having any systemic symptoms including fever, chills, nausea or vomiting.   Past Medical History  Diagnosis Date  . Hypertension   . Heart murmur   . Anal fissure   . Personal history of colonic polyps     There are no active problems to display for this patient.   Past Surgical History  Procedure Laterality Date  . Hernia repair  2012  . Colonoscopy  2012  . Larynx-amyloidosis-laser surgery   2010    Current Outpatient Rx  Name  Route  Sig  Dispense  Refill  . amLODipine (NORVASC) 10 MG tablet               . ampicillin (PRINCIPEN) 500 MG capsule   Oral   Take 1 capsule (500 mg total) by mouth 4 (four) times daily.   40 capsule   0   . benazepril (LOTENSIN) 40 MG tablet               . montelukast (SINGULAIR) 10 MG tablet   Oral   Take 10 mg by mouth at bedtime.         Marland Kitchen. nystatin cream (MYCOSTATIN)               . nystatin-triamcinolone ointment (MYCOLOG)               . omeprazole (PRILOSEC) 20 MG capsule   Oral   Take 20 mg by mouth daily.         . sotalol (BETAPACE) 80 MG tablet               . XARELTO 20 MG TABS tablet                 Dispense as written.      Allergies Review of patient's allergies indicates no known allergies.  History reviewed. No pertinent family history.  Social History Social History  Substance Use Topics  . Smoking status: Current Every Day Smoker -- 1.00 packs/day for 20 years  . Smokeless tobacco: Never Used  . Alcohol Use: Yes    Review of Systems Constitutional: No fever/chills. Eyes: No visual changes. ENT: No sore throat. No congestion or rhinorrhea. Cardiovascular: Denies chest pain. Denies palpitations. Respiratory: Denies shortness of breath.  No cough. Gastrointestinal: No abdominal pain.  No nausea, no vomiting.  No diarrhea.  No constipation. Genitourinary: Negative for dysuria. Musculoskeletal: Negative for back pain.Positive chronic bilateral lymphedema. Skin: Negative for rash. Positive open wound with significant bleeding. Neurological: Negative for headaches. No focal numbness, tingling or weakness.   10-point ROS otherwise negative.  ____________________________________________   PHYSICAL EXAM:  VITAL SIGNS: ED Triage Vitals  Enc Vitals Group     BP 04/17/16 1753 185/80 mmHg     Pulse  Rate 04/17/16 1753 78     Resp 04/17/16 1753 18     Temp 04/17/16 1753 98.3 F (36.8 C)     Temp Source 04/17/16 1753 Oral     SpO2 04/17/16 1753 97 %     Weight 04/17/16 1753 331 lb (150.141 kg)     Height 04/17/16 1753 6\' 4"  (1.93 m)     Head Cir --      Peak Flow --      Pain Score 04/17/16 1754 0     Pain Loc --      Pain Edu? --      Excl. in GC? --     Constitutional: Alert and oriented. Well appearing and in no acute distress. Answers questions appropriately. Eyes: Conjunctivae are normal.  EOMI. No scleral icterus. Head: Atraumatic. Nose: No congestion/rhinnorhea. Mouth/Throat: Mucous membranes are moist.  Neck: No stridor.  Supple.   Cardiovascular: Normal rate, .  Respiratory: Normal respiratory effort.   Gastrointestinal: Soft, nontender and nondistended.  No guarding or  rebound.  No peritoneal signs. Musculoskeletal: Positive bilateral significant lower extremity edema with significant tightening of the skin due to this. The patient has overlying scaly skin with chronic lymphedema changes including poor hair growth and thickening of the skin. The right lower extremity is particularly erythematous although it is not warm. On the lateral aspect of the right foot, the patient has a 1.5 cm open wound which appears to be a skin tear, with an arterial pulsatile bleeder. Neurologic:  A&Ox3.  Speech is clear.  Face and smile are symmetric.  EOMI.  Moves all extremities well. Skin:  Skin is warm, dry and intact. No rash noted. Psychiatric: Mood and affect are normal. Speech and behavior are normal.  Normal judgement.  ____________________________________________   LABS (all labs ordered are listed, but only abnormal results are displayed)  Labs Reviewed  CBC - Abnormal; Notable for the following:    WBC 11.8 (*)    RBC 3.68 (*)    HCT 39.4 (*)    MCV 107.2 (*)    MCH 37.0 (*)    All other components within normal limits  BASIC METABOLIC PANEL - Abnormal; Notable for the following:    Sodium 121 (*)    Chloride 87 (*)    Glucose, Bld 109 (*)    Creatinine, Ser 0.59 (*)    Calcium 8.5 (*)    All other components within normal limits   ____________________________________________  EKG  Not indicated ____________________________________________  RADIOLOGY  No results found.  ____________________________________________   PROCEDURES  Procedure(s) performed: None  Critical Care performed: No ____________________________________________   INITIAL IMPRESSION / ASSESSMENT AND PLAN / ED COURSE  Pertinent labs & imaging results that were available during my care of the patient were reviewed by me and considered in my medical decision making (see chart for details).  55 y.o. male with bilateral lower extremity lymphedema presenting with bleeding  from the right lateral foot. The patient's legs are concerning for possible infection although a changes on his legs could be from lymphedema alone. I will place some sutures around the laceration to stop bleeding and apply pressure for a full 20 minutes given his Xarelto use.  ----------------------------------------- 7:22 PM on 04/17/2016 -----------------------------------------  The patient's basic labs do show that his white blood cell count is elevated over his baseline, so bring him in for IV antibiotics to treat lower shotty cellulitis. His bleeding has significantly improved.  The patient has a history of  chronic hyponatremia, with previous sodiums as low as 126. Today, his sodium is 121, and he is asymptomatic, but this warrants admission for correction. He will be unable to receive fluids in high concentrations over rapid rate due to his lymphedema, as this would worsen his condition.  LACERATION REPAIR Performed by: Rockne Menghini Authorized by: Rockne Menghini Consent: Verbal consent obtained. Risks and benefits: risks, benefits and alternatives were discussed Consent given by: patient Patient identity confirmed: provided demographic data Prepped and Draped in normal sterile fashion Wound explored  Laceration Location: right lateral foot  Laceration Length: 1.2cm  No Foreign Bodies seen or palpated  Anesthesia: local infiltration  Local anesthetic: lidocaine 1% with epinephrine  Anesthetic total: 1.5 ml  Irrigation method: syringe Amount of cleaning: standard  Skin closure: 4-0 Prolene  Number of sutures: 3  Technique: simple interrupted  Patient tolerance: Patient tolerated the procedure well with no immediate complications.  Bleeding almost completely stopped after sutures placed.  ____________________________________________  FINAL CLINICAL IMPRESSION(S) / ED DIAGNOSES  Final diagnoses:  Laceration of right foot, initial encounter   Bleeding  Hyponatremia  Cellulitis of right leg      NEW MEDICATIONS STARTED DURING THIS VISIT:  New Prescriptions   No medications on file     Rockne Menghini, MD 04/17/16 1923  Rockne Menghini, MD 04/17/16 1924

## 2016-04-17 NOTE — H&P (Signed)
[facility] - [place] [facility] - Closed   PATIENT NAME: [name]    MR#:  [id number]  DATE OF BIRTH:  07/17/1961   DATE OF ADMISSION:  04/17/2016  PRIMARY CARE PHYSICIAN: Corky Downs, MD   REQUESTING/REFERRING PHYSICIAN: norman  CHIEF COMPLAINT:   Chief Complaint  Patient presents with  . Foot Swelling  . Wound Check    lower feet    HISTORY OF PRESENT ILLNESS:  [name]  is a 55 y.o. male with a known history of lymphedema who is presenting with right leg bleeding wound. States that he just completed a course of amoxicillin for cellulitis but has been having persistent edema. Noticed increased redness particularly in his right foot. Today while walking to the kitchen he had an acute episode of a bleeding wound. Denies fever, chills, further symptomatology present to the hospital given active bleeding. Emergency department staff was sutured wound and stopped bleeding. Found to have sodium of 121  PAST MEDICAL HISTORY:   Past Medical History  Diagnosis Date  . Hypertension   . Heart murmur   . Anal fissure   . Personal history of colonic polyps     PAST SURGICAL HISTORY:   Past Surgical History  Procedure Laterality Date  . Hernia repair  2012  . Colonoscopy  2012  . Larynx-amyloidosis-laser surgery   2010    SOCIAL HISTORY:   Social History  Substance Use Topics  . Smoking status: Current Every Day Smoker -- 1.00 packs/day for 20 years  . Smokeless tobacco: Never Used  . Alcohol Use: Yes    FAMILY HISTORY:   Family History  Problem Relation Age of Onset  . Hypertension Other     DRUG ALLERGIES:   Allergies  Allergen Reactions  . Clindamycin/Lincomycin Diarrhea    REVIEW OF SYSTEMS:  REVIEW OF SYSTEMS:  CONSTITUTIONAL: Denies fevers, chills, fatigue, weakness.  EYES: Denies blurred vision, double vision, or eye pain.  EARS, NOSE, THROAT: Denies tinnitus, ear pain, hearing loss.  RESPIRATORY: denies cough, shortness of  breath, wheezing  CARDIOVASCULAR: Denies chest pain, palpitations, positive edema.  GASTROINTESTINAL: Denies nausea, vomiting, diarrhea, abdominal pain.  GENITOURINARY: Denies dysuria, hematuria.  ENDOCRINE: Denies nocturia or thyroid problems. HEMATOLOGIC AND LYMPHATIC: Denies easy bruising or bleeding.  SKIN: Bilateral red rash, dry skin lower extremities otherwise Denies rash or lesions.  MUSCULOSKELETAL: Denies pain in neck, back, shoulder, knees, hips, or further arthritic symptoms.  NEUROLOGIC: Denies paralysis, paresthesias.  PSYCHIATRIC: Denies anxiety or depressive symptoms. Otherwise full review of systems performed by me is negative.   MEDICATIONS AT HOME:   Prior to Admission medications   Medication Sig Start Date End Date Taking? Authorizing Provider  ampicillin (PRINCIPEN) 500 MG capsule Take 1 capsule (500 mg total) by mouth 4 (four) times daily. Patient not taking: Reported on 04/17/2016 04/06/16   Kieth Brightly, MD  benazepril (LOTENSIN) 40 MG tablet  02/12/16   Historical Provider, MD  omeprazole (PRILOSEC) 20 MG capsule Take 20 mg by mouth daily.    Historical Provider, MD  sotalol (BETAPACE) 80 MG tablet  01/27/16   Historical Provider, MD  XARELTO 20 MG TABS tablet  02/02/16   Historical Provider, MD      VITAL SIGNS:  Blood pressure 185/80, pulse 78, temperature 98.3 F (36.8 C), temperature source Oral, resp. rate 18, height  (1.93 m), weight 150.141 kg (331 lb), SpO2 97 %.  PHYSICAL EXAMINATION:  VITAL SIGNS: Filed Vitals:   04/17/16 1753  BP: 185/80  Pulse: 78  Temp: 98.3 F (36.8 C)  Resp: 18   GENERAL:55 y.o.male currently in no acute distress.  HEAD: Normocephalic, atraumatic.  EYES: Pupils equal, round, reactive to light. Extraocular muscles intact. No scleral icterus.  MOUTH: Moist mucosal membrane. Dentition intact. No abscess noted.  EAR, NOSE, THROAT: Clear without exudates. No external lesions.  NECK: Supple. No thyromegaly. No  nodules. No JVD.  PULMONARY: Clear to ascultation, without wheeze rails or rhonci. No use of accessory muscles, Good respiratory effort. good air entry bilaterally CHEST: Nontender to palpation.  CARDIOVASCULAR: S1 and S2. Regular rate and rhythm. No murmurs, rubs, or gallops. 2+ edema. Pedal pulses 2+ bilaterally.  GASTROINTESTINAL: Soft, nontender, nondistended. No masses. Positive bowel sounds. No hepatosplenomegaly.  MUSCULOSKELETAL: No swelling, clubbing, or edema. Range of motion full in all extremities.  NEUROLOGIC: Cranial nerves II through XII are intact. No gross focal neurological deficits. Sensation intact. Reflexes intact.  SKIN: Right heel ulceration evidence of recent bleed, remainder skin erythematous hot to touch otherwise No ulceration, lesions, rashes, or cyanosis. Skin warm and dry. Turgor intact.  PSYCHIATRIC: Mood, affect within normal limits. The patient is awake, alert and oriented x 3. Insight, judgment intact.    LABORATORY PANEL:   CBC  Recent Labs Lab 04/17/16 1802  WBC 11.8*  HGB 13.6  HCT 39.4*  PLT 193   ------------------------------------------------------------------------------------------------------------------  Chemistries   Recent Labs Lab 04/17/16 1802  NA 121*  K 4.4  CL 87*  CO2 22  GLUCOSE 109*  BUN 12  CREATININE 0.59*  CALCIUM 8.5*   ------------------------------------------------------------------------------------------------------------------  Cardiac Enzymes No results for input(s): TROPONINI in the last 168 hours. ------------------------------------------------------------------------------------------------------------------  RADIOLOGY:  No results found.  EKG:   Orders placed or performed in visit on 08/15/09  . EKG 12-Lead    IMPRESSION AND PLAN:   55 year old Caucasian gentleman history of lymphedema presenting after right leg wounds or bleeding.  1. Hyponatremia of 121: Appears to be hypervolemic we'll  check echocardiogram provide diuresis, check urine sodium, urine osmolality, TSH would avoid giving further fluid at this time given evidence of edema 2. Cellulitis right lower extremity: Bleeding ulcer with surrounding erythema warm to touch has just finished a course of amoxicillin as previously been on clindamycin about 2-3 months ago but had issues with diarrhea at that time will place on ceftriaxone and follow culture data 3. GERD without esophagitis PPI therapy 4. Lymphedema with bleeding consult vascular surgery as well as wound therapy 5. Essential hypertension: Benazepril    All the records are reviewed and case discussed with ED provider. Management plans discussed with the patient, family and they are in agreement.  CODE STATUS: Full  TOTAL TIME TAKING CARE OF THIS PATIENT: 33 minutes.    Hower,  Mardi MainlandDavid K M.D on 04/17/2016 at 7:55 PM  Between 7am to 6pm - Pager - [phone]  After 6pm: House Pager: - [phone]  [facility]  Office  [phone]  CC: Primary care physician; Corky DownsMASOUD,[name], MD

## 2016-04-17 NOTE — Discharge Instructions (Signed)
Please make an appointment with your primary care physician for suture removal in 7-10 days. Please continue to monitor your wound, let your primary care doctor know if you see any signs of swelling, redness, discharge or pus, or if you develop fever, nausea or vomiting.    Laceration Care, Adult A laceration is a cut that goes through all layers of the skin. The cut also goes into the tissue that is right under the skin. Some cuts heal on their own. Others need to be closed with stitches (sutures), staples, skin adhesive strips, or wound glue. Taking care of your cut lowers your risk of infection and helps your cut to heal better. HOW TO TAKE CARE OF YOUR CUT For stitches or staples:  Keep the wound clean and dry.  If you were given a bandage (dressing), you should change it at least one time per day or as told by your doctor. You should also change it if it gets wet or dirty.  Keep the wound completely dry for the first 24 hours or as told by your doctor. After that time, you may take a shower or a bath. However, make sure that the wound is not soaked in water until after the stitches or staples have been removed.  Clean the wound one time each day or as told by your doctor:  Wash the wound with soap and water.  Rinse the wound with water until all of the soap comes off.  Pat the wound dry with a clean towel. Do not rub the wound.  After you clean the wound, put a thin layer of antibiotic ointment on it as told by your doctor. This ointment:  Helps to prevent infection.  Keeps the bandage from sticking to the wound.  Have your stitches or staples removed as told by your doctor. If your doctor used skin adhesive strips:   Keep the wound clean and dry.  If you were given a bandage, you should change it at least one time per day or as told by your doctor. You should also change it if it gets dirty or wet.  Do not get the skin adhesive strips wet. You can take a shower or a bath,  but be careful to keep the wound dry.  If the wound gets wet, pat it dry with a clean towel. Do not rub the wound.  Skin adhesive strips fall off on their own. You can trim the strips as the wound heals. Do not remove any strips that are still stuck to the wound. They will fall off after a while. If your doctor used wound glue:  Try to keep your wound dry, but you may briefly wet it in the shower or bath. Do not soak the wound in water, such as by swimming.  After you take a shower or a bath, gently pat the wound dry with a clean towel. Do not rub the wound.  Do not do any activities that will make you really sweaty until the skin glue has fallen off on its own.  Do not apply liquid, cream, or ointment medicine to your wound while the skin glue is still on.  If you were given a bandage, you should change it at least one time per day or as told by your doctor. You should also change it if it gets dirty or wet.  If a bandage is placed over the wound, do not let the tape for the bandage touch the skin glue.  Do  not pick at the glue. The skin glue usually stays on for 5-10 days. Then, it falls off of the skin. General Instructions  To help prevent scarring, make sure to cover your wound with sunscreen whenever you are outside after stitches are removed, after adhesive strips are removed, or when wound glue stays in place and the wound is healed. Make sure to wear a sunscreen of at least 30 SPF.  Take over-the-counter and prescription medicines only as told by your doctor.  If you were given antibiotic medicine or ointment, take or apply it as told by your doctor. Do not stop using the antibiotic even if your wound is getting better.  Do not scratch or pick at the wound.  Keep all follow-up visits as told by your doctor. This is important.  Check your wound every day for signs of infection. Watch for:  Redness, swelling, or pain.  Fluid, blood, or pus.  Raise (elevate) the injured  area above the level of your heart while you are sitting or lying down, if possible. GET HELP IF:  You got a tetanus shot and you have any of these problems at the injection site:  Swelling.  Very bad pain.  Redness.  Bleeding.  You have a fever.  A wound that was closed breaks open.  You notice a bad smell coming from your wound or your bandage.  You notice something coming out of the wound, such as wood or glass.  Medicine does not help your pain.  You have more redness, swelling, or pain at the site of your wound.  You have fluid, blood, or pus coming from your wound.  You notice a change in the color of your skin near your wound.  You need to change the bandage often because fluid, blood, or pus is coming from the wound.  You start to have a new rash.  You start to have numbness around the wound. GET HELP RIGHT AWAY IF:  You have very bad swelling around the wound.  Your pain suddenly gets worse and is very bad.  You notice painful lumps near the wound or on skin that is anywhere on your body.  You have a red streak going away from your wound.  The wound is on your hand or foot and you cannot move a finger or toe like you usually can.  The wound is on your hand or foot and you notice that your fingers or toes look pale or bluish.   This information is not intended to replace advice given to you by your health care provider. Make sure you discuss any questions you have with your health care provider.   Document Released: 05/17/2008 Document Revised: 04/15/2015 Document Reviewed: 11/25/2014 Elsevier Interactive Patient Education [employer]2016 [employer]

## 2016-04-18 ENCOUNTER — Inpatient Hospital Stay
Admit: 2016-04-18 | Discharge: 2016-04-18 | Disposition: A | Discharge status: Home / Self Care | Payer: BC Managed Care – PPO | Attending: Internal Medicine | Admitting: Internal Medicine

## 2016-04-18 LAB — BASIC METABOLIC PANEL
ANION GAP: 10 (ref 5–15)
BUN: 12 mg/dL (ref 6–20)
CO2: 27 mmol/L (ref 22–32)
Calcium: 8.4 mg/dL — ABNORMAL LOW (ref 8.9–10.3)
Chloride: 87 mmol/L — ABNORMAL LOW (ref 101–111)
Creatinine, Ser: 0.72 mg/dL (ref 0.61–1.24)
GFR calc Af Amer: >60 mL/min (ref >60)
Glucose, Bld: 144 mg/dL — ABNORMAL HIGH (ref 65–99)
POTASSIUM: 4.3 mmol/L (ref 3.5–5.1)
SODIUM: 124 mmol/L — AB (ref 135–145)

## 2016-04-18 LAB — CBC
HEMATOCRIT: 38.3 % — AB (ref 40.0–52.0)
Hemoglobin: 13.5 g/dL (ref 13.0–18.0)
MCH: 37.5 pg — ABNORMAL HIGH (ref 26.0–34.0)
MCHC: 35.3 g/dL (ref 32.0–36.0)
MCV: 106.2 fL — ABNORMAL HIGH (ref 80.0–100.0)
Platelets: 164 10*3/uL (ref 150–440)
RBC: 3.61 MIL/uL — ABNORMAL LOW (ref 4.40–5.90)
RDW: 13.5 % (ref 11.5–14.5)
WBC: 9.8 10*3/uL (ref 3.8–10.6)

## 2016-04-18 LAB — SODIUM, URINE, RANDOM: Sodium, Ur: 22 mmol/L

## 2016-04-18 LAB — ECHOCARDIOGRAM COMPLETE
HEIGHTINCHES: 76 in
Weight: 4942.4 oz

## 2016-04-18 LAB — OSMOLALITY, URINE: Osmolality, Ur: 207 mOsm/kg — ABNORMAL LOW (ref 300–900)

## 2016-04-18 MED ORDER — DEXTROSE 5 % IV SOLN
1.0000 g | INTRAVENOUS | Status: DC
  Administered 2016-04-18: 1 g via INTRAVENOUS
  Filled 2016-04-18 (×2): qty 10

## 2016-04-18 MED ORDER — DEXTROSE 5 % IV SOLN: 1.0000 g | Freq: Once | INTRAVENOUS | Status: DC

## 2016-04-18 NOTE — Progress Notes (Signed)
[employer] - [place] at [facility]   PATIENT NAME: [name]    MR#:  [id number]  DATE OF BIRTH:  28-Jun-1961  SUBJECTIVE:   Patient here due to bilateral lower extremity edema with underlying cellulitis and also a bleeding ulcer. Both legs are wrapped presently. Wife at bedside. No acute bleeding presently.  REVIEW OF SYSTEMS:    Review of Systems  Constitutional: Negative for fever and chills.  HENT: Negative for congestion and tinnitus.   Eyes: Negative for blurred vision and double vision.  Respiratory: Negative for cough, shortness of breath and wheezing.   Cardiovascular: Positive for leg swelling. Negative for chest pain, orthopnea and PND.  Gastrointestinal: Negative for nausea, vomiting, abdominal pain and diarrhea.  Genitourinary: Negative for dysuria and hematuria.  Neurological: Negative for dizziness, sensory change and focal weakness.  All other systems reviewed and are negative.   Nutrition: Heart Healthy Tolerating Diet: yes Tolerating PT: Await Eval.      DRUG ALLERGIES:   Allergies  Allergen Reactions  . Clindamycin/Lincomycin Diarrhea    VITALS:  Blood pressure 108/77, pulse 74, temperature 98.6 F (37 C), temperature source Oral, resp. rate 20, height  (1.93 m), weight 140.116 kg (308 lb 14.4 oz), SpO2 96 %.  PHYSICAL EXAMINATION:   Physical Exam  GENERAL:  55 y.o.-year-old obese patient lying in the bed in no acute distress.  EYES: Pupils equal, round, reactive to light and accommodation. No scleral icterus. Extraocular muscles intact.  HEENT: Head atraumatic, normocephalic. Oropharynx and nasopharynx clear.  NECK:  Supple, no jugular venous distention. No thyroid enlargement, no tenderness.  LUNGS: Normal breath sounds bilaterally, no wheezing, rales, rhonchi. No use of accessory muscles of respiration.  CARDIOVASCULAR: S1, S2 normal. No murmurs, rubs, or gallops.  ABDOMEN: Soft, nontender, nondistended. Bowel  sounds present. No organomegaly or mass.  EXTREMITIES: No cyanosis, clubbing, b/l +2-3 LE edema b/l.  LE wrapped in UNNA boot b/l.  NEUROLOGIC: Cranial nerves II through XII are intact. No focal Motor or sensory deficits b/l.   PSYCHIATRIC: The patient is alert and oriented x 3.    SKIN: No obvious rash, lesion, or ulcer.    LABORATORY PANEL:   CBC  Recent Labs Lab 04/18/16 0356  WBC 9.8  HGB 13.5  HCT 38.3*  PLT 164   ------------------------------------------------------------------------------------------------------------------  Chemistries   Recent Labs Lab 04/18/16 0356  NA 124*  K 4.3  CL 87*  CO2 27  GLUCOSE 144*  BUN 12  CREATININE 0.72  CALCIUM 8.4*   ------------------------------------------------------------------------------------------------------------------  Cardiac Enzymes No results for input(s): TROPONINI in the last 168 hours. ------------------------------------------------------------------------------------------------------------------  RADIOLOGY:  No results found.   ASSESSMENT AND PLAN:   55 year old male with past medical history of hypertension, history of chronic atrial fibrillation, bilateral chronic lower extremity edema, GERD who presented to the hospital due bleeding ulcer in the lower extremity with worsening lower extremity edema.  1. 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 Continue empiric antibiotics with Zosyn. - appreciate Vascular consult and no plans for surgical intervention. Follow up as outpatient.  - will get wound team consult and Podiatry consult.  - cont. Supportive care and likely will need UNNA boot likely.   2. Chronic a. Fib - rate controlled.  - cont. Sotalol.  Cont. Xarelto.   3. HTN - Norvasc, Benazepril.   4. GERD - cont. Protonix.     All the records are reviewed and case discussed with Care  Management/Social  Workerr. Management plans discussed with the patient, family and they are in agreement.  CODE STATUS: Full   DVT Prophylaxis: Xarelto  TOTAL TIME TAKING CARE OF THIS PATIENT: 30 minutes.   POSSIBLE D/C IN 1-2 DAYS, DEPENDING ON CLINICAL CONDITION.   Houston SirenSAINANI,VIVEK J M.D on 04/18/2016 at 12:56 PM  Between 7am to 6pm - Pager - [phone]  After 6pm go to [email or web address] - password [email or web address] [facility]  [facility]  Office  [phone]  CC: Primary care physician; Corky DownsMASOUD,[name], MD

## 2016-04-18 NOTE — Progress Notes (Signed)
*  PRELIMINARY RESULTS* Echocardiogram 2D Echocardiogram has been performed.  [name] 04/18/2016, 9:17 AM

## 2016-04-18 NOTE — Progress Notes (Signed)
RN spoke with Vascular Surgery and was instructed to change the patient's dressing on right leg/foot with 4X4 gauze, ABD, and kerlex [facility] come evaluate tomorrow.

## 2016-04-18 NOTE — Consult Note (Signed)
Reason for Consult: Chronic edema with cellulitis and foot ulcerations. Referring Physician: [facility]  [name] is an 55 y.o. male.  HPI: The patient relates a significant chronic history of edema in both lower extremities. He has been treated outpatient with Unna boots as well as a couple of rounds of antibiotics in the past few months. Relates chronic draining wounds on both legs. Yesterday he did notice some acute bleeding from his right foot and presented to the emergency department. States a couple of sutures were placed in the open area on the right foot to stop the bleeding. Admitted for antibiotics and treatment  Past Medical History  Diagnosis Date  . Hypertension   . Heart murmur   . Anal fissure   . Personal history of colonic polyps     Past Surgical History  Procedure Laterality Date  . Hernia repair  2012  . Colonoscopy  2012  . Larynx-amyloidosis-laser surgery   2010    Family History  Problem Relation Age of Onset  . Hypertension Other     Social History:  reports that he has been smoking.  He has never used smokeless tobacco. He reports that he drinks alcohol. He reports that he does not use illicit drugs.  Allergies:  Allergies  Allergen Reactions  . Clindamycin/Lincomycin Diarrhea    Medications:  Scheduled: . amLODipine  10 mg Oral Daily  . benazepril  40 mg Oral Daily  . montelukast  10 mg Oral QHS  . pantoprazole  40 mg Oral Daily  . rivaroxaban  20 mg Oral Daily  . sotalol  120 mg Oral Q12H    Results for orders placed or performed during the hospital encounter of 04/17/16 (from the past 48 hour(s))  CBC     Status: Abnormal   Collection Time: 04/17/16  6:02 PM  Result Value Ref Range   WBC 11.8 (H) 3.8 - 10.6 K/uL   RBC 3.68 (L) 4.40 - 5.90 MIL/uL   Hemoglobin 13.6 13.0 - 18.0 g/dL   HCT 39.4 (L) 40.0 - 52.0 %   MCV 107.2 (H) 80.0 - 100.0 fL   MCH 37.0 (H) 26.0 - 34.0 pg   MCHC 34.5 32.0 - 36.0 g/dL   RDW 13.4  11.5 - 14.5 %   Platelets 193 150 - 440 K/uL  Basic metabolic panel     Status: Abnormal   Collection Time: 04/17/16  6:02 PM  Result Value Ref Range   Sodium 121 (L) 135 - 145 mmol/L   Potassium 4.4 3.5 - 5.1 mmol/L   Chloride 87 (L) 101 - 111 mmol/L   CO2 22 22 - 32 mmol/L   Glucose, Bld 109 (H) 65 - 99 mg/dL   BUN 12 6 - 20 mg/dL   Creatinine, Ser 0.59 (L) 0.61 - 1.24 mg/dL   Calcium 8.5 (L) 8.9 - 10.3 mg/dL   GFR calc non Af Amer >60 >60 mL/min   GFR calc Af Amer >60 >60 mL/min    Comment: (NOTE) The eGFR has been calculated using the CKD EPI equation. This calculation has not been validated in all clinical situations. eGFR's persistently <60 mL/min signify possible Chronic Kidney Disease.    Anion gap 12 5 - 15  TSH     Status: None   Collection Time: 04/17/16  6:02 PM  Result Value Ref Range   TSH 1.830 0.350 - 4.500 uIU/mL  Sodium, urine, random     Status: None   Collection Time: 04/17/16  11:47 PM  Result Value Ref Range   Sodium, Ur 22 mmol/L  Osmolality, urine     Status: Abnormal   Collection Time: 04/17/16 11:47 PM  Result Value Ref Range   Osmolality, Ur 207 (L) 300 - 900 mOsm/kg  Basic metabolic panel     Status: Abnormal   Collection Time: 04/18/16  3:56 AM  Result Value Ref Range   Sodium 124 (L) 135 - 145 mmol/L   Potassium 4.3 3.5 - 5.1 mmol/L   Chloride 87 (L) 101 - 111 mmol/L   CO2 27 22 - 32 mmol/L   Glucose, Bld 144 (H) 65 - 99 mg/dL   BUN 12 6 - 20 mg/dL   Creatinine, Ser 0.72 0.61 - 1.24 mg/dL   Calcium 8.4 (L) 8.9 - 10.3 mg/dL   GFR calc non Af Amer >60 >60 mL/min   GFR calc Af Amer >60 >60 mL/min    Comment: (NOTE) The eGFR has been calculated using the CKD EPI equation. This calculation has not been validated in all clinical situations. eGFR's persistently <60 mL/min signify possible Chronic Kidney Disease.    Anion gap 10 5 - 15  CBC     Status: Abnormal   Collection Time: 04/18/16  3:56 AM  Result Value Ref Range   WBC 9.8 3.8 -  10.6 K/uL   RBC 3.61 (L) 4.40 - 5.90 MIL/uL   Hemoglobin 13.5 13.0 - 18.0 g/dL   HCT 38.3 (L) 40.0 - 52.0 %   MCV 106.2 (H) 80.0 - 100.0 fL   MCH 37.5 (H) 26.0 - 34.0 pg   MCHC 35.3 32.0 - 36.0 g/dL   RDW 13.5 11.5 - 14.5 %   Platelets 164 150 - 440 K/uL    No results found.  Review of Systems  Constitutional: Negative.   HENT: Negative.   Eyes: Negative.   Respiratory: Negative.   Cardiovascular: Negative.   Gastrointestinal: Negative.   Genitourinary: Negative.   Musculoskeletal:       Relates some chronic pain in his back. Also has had some pain in the feet when he walks due to the open sores.  Skin:       Chronic swelling and redness in both of his legs. Also relates some chronic draining sores on the legs. He has previously had treatment outpatient with Unna boots. Recent bleeding from an area on his right foot.  Neurological: Negative.   Endo/Heme/Allergies: Negative.   Psychiatric/Behavioral: Negative.    Blood pressure 115/86, pulse 60, temperature 98.1 F (36.7 C), temperature source Oral, resp. rate 16, height 6' 4"  (1.93 m), weight 140.116 kg (308 lb 14.4 oz), SpO2 97 %. Physical Exam  Cardiovascular:  DP and PT pulses are palpable but diminished secondary to swelling. Capillary filling time intact.  Musculoskeletal:  Stiff range of motion. Joints. There is pain on palpation around the feet where the open sores relocated. Muscle testing is deferred.  Neurological:  Epicritic sensations appear grossly intact bilateral.  Skin:  Significant bilateral lower extremity edema and erythema in the foot and leg. Ulcerations are noted on the distal leg bilateral with moderate to heavy drainage. A full thickness ulcerative areas noted on the posterior aspect of the left heel and ankle area measuring approximately 7 mm diameter with a couple of smaller areas 2-3 mm diameter. No purulence is noted. 2 sutures are noted in place along the lateral aspect of the right foot with no  drainage. A moderate amount of skin slough is noted around both  feet.    Assessment/Plan: Assessment: Chronic venous stasis with bilateral foot and leg ulcerations  Plan: Excisional debridement of some of the superficial skin slough as well as the ulcerative area on the posterior aspect of the left ankle region. Good healthy bleeding was noted with no purulence. Bandages were reapplied. Agree with wound care recommendations of whirlpool and more frequent treatments for the ulcerations. At this point no real sign of abscess or infection in the feet and I do not believe any additional debridements will be needed. Recommend that he continue to follow up with the wound care clinic as well as vascular for his venous stasis. We will follow outpatient if needed.  Durward Fortes 04/18/2016, 5:04 PM

## 2016-04-18 NOTE — Consult Note (Signed)
Reason for Consult: Chronic Venous stasis and ulcerations of lower extremities Referring Physician: Dr. Arman Filter is an 55 y.o. male.  HPI: Patient with chronic bilateral lower extremity venous stasis ulcers, edema and cellulitis presents with bleeding from an ulceration on the left leg. He has had outpatient care with PO antibiotics and UNNA boots. He states he notes the edema has improved however, the erythema and areas of ulceration are recurrent. Denies claudication or rest pain. He has a history of prolonged standing in the past and developed significant edema during that time.  Past Medical History  Diagnosis Date  . Hypertension   . Heart murmur   . Anal fissure   . Personal history of colonic polyps     Past Surgical History  Procedure Laterality Date  . Hernia repair  2012  . Colonoscopy  2012  . Larynx-amyloidosis-laser surgery   2010    Family History  Problem Relation Age of Onset  . Hypertension Other     Social History:  reports that he has been smoking.  He has never used smokeless tobacco. He reports that he drinks alcohol. He reports that he does not use illicit drugs.  Allergies:  Allergies  Allergen Reactions  . Clindamycin/Lincomycin Diarrhea    Medications: I have reviewed the patient's current medications.  Results for orders placed or performed during the hospital encounter of 04/17/16 (from the past 48 hour(s))  CBC     Status: Abnormal   Collection Time: 04/17/16  6:02 PM  Result Value Ref Range   WBC 11.8 (H) 3.8 - 10.6 K/uL   RBC 3.68 (L) 4.40 - 5.90 MIL/uL   Hemoglobin 13.6 13.0 - 18.0 g/dL   HCT 39.4 (L) 40.0 - 52.0 %   MCV 107.2 (H) 80.0 - 100.0 fL   MCH 37.0 (H) 26.0 - 34.0 pg   MCHC 34.5 32.0 - 36.0 g/dL   RDW 13.4 11.5 - 14.5 %   Platelets 193 150 - 440 K/uL  Basic metabolic panel     Status: Abnormal   Collection Time: 04/17/16  6:02 PM  Result Value Ref Range   Sodium 121 (L) 135 - 145 mmol/L   Potassium 4.4 3.5  - 5.1 mmol/L   Chloride 87 (L) 101 - 111 mmol/L   CO2 22 22 - 32 mmol/L   Glucose, Bld 109 (H) 65 - 99 mg/dL   BUN 12 6 - 20 mg/dL   Creatinine, Ser 0.59 (L) 0.61 - 1.24 mg/dL   Calcium 8.5 (L) 8.9 - 10.3 mg/dL   GFR calc non Af Amer >60 >60 mL/min   GFR calc Af Amer >60 >60 mL/min    Comment: (NOTE) The eGFR has been calculated using the CKD EPI equation. This calculation has not been validated in all clinical situations. eGFR's persistently <60 mL/min signify possible Chronic Kidney Disease.    Anion gap 12 5 - 15  TSH     Status: None   Collection Time: 04/17/16  6:02 PM  Result Value Ref Range   TSH 1.830 0.350 - 4.500 uIU/mL  Sodium, urine, random     Status: None   Collection Time: 04/17/16 11:47 PM  Result Value Ref Range   Sodium, Ur 22 mmol/L  Osmolality, urine     Status: Abnormal   Collection Time: 04/17/16 11:47 PM  Result Value Ref Range   Osmolality, Ur 207 (L) 300 - 900 mOsm/kg  Basic metabolic panel     Status: Abnormal  Collection Time: 04/18/16  3:56 AM  Result Value Ref Range   Sodium 124 (L) 135 - 145 mmol/L   Potassium 4.3 3.5 - 5.1 mmol/L   Chloride 87 (L) 101 - 111 mmol/L   CO2 27 22 - 32 mmol/L   Glucose, Bld 144 (H) 65 - 99 mg/dL   BUN 12 6 - 20 mg/dL   Creatinine, Ser 0.72 0.61 - 1.24 mg/dL   Calcium 8.4 (L) 8.9 - 10.3 mg/dL   GFR calc non Af Amer >60 >60 mL/min   GFR calc Af Amer >60 >60 mL/min    Comment: (NOTE) The eGFR has been calculated using the CKD EPI equation. This calculation has not been validated in all clinical situations. eGFR's persistently <60 mL/min signify possible Chronic Kidney Disease.    Anion gap 10 5 - 15  CBC     Status: Abnormal   Collection Time: 04/18/16  3:56 AM  Result Value Ref Range   WBC 9.8 3.8 - 10.6 K/uL   RBC 3.61 (L) 4.40 - 5.90 MIL/uL   Hemoglobin 13.5 13.0 - 18.0 g/dL   HCT 38.3 (L) 40.0 - 52.0 %   MCV 106.2 (H) 80.0 - 100.0 fL   MCH 37.5 (H) 26.0 - 34.0 pg   MCHC 35.3 32.0 - 36.0 g/dL   RDW  13.5 11.5 - 14.5 %   Platelets 164 150 - 440 K/uL    No results found.  Review of Systems  Constitutional: Negative for fever, chills and malaise/fatigue.  HENT: Negative.   Eyes: Negative.   Respiratory: Negative.   Cardiovascular: Negative.   Gastrointestinal: Negative.   Genitourinary: Negative.   Musculoskeletal:       Bilateral lower extremity edema  Skin:       Bilateral lower extremity venous stasis and cellulitis  Neurological: Negative.  Negative for weakness.  Endo/Heme/Allergies: Negative.   Psychiatric/Behavioral: Negative.    Blood pressure 108/77, pulse 74, temperature 98.6 F (37 C), temperature source Oral, resp. rate 20, height 6' 4"  (1.93 m), weight 140.116 kg (308 lb 14.4 oz), SpO2 96 %. Physical Exam  Constitutional: He is oriented to person, place, and time. He appears well-developed.  HENT:  Head: Normocephalic.  Neck: Normal range of motion. No thyromegaly present.  Cardiovascular: Normal rate and regular rhythm.   Respiratory: No respiratory distress. He has no wheezes. He has rales. He exhibits no tenderness.  GI: Soft. He exhibits no distension. There is no tenderness. There is no rebound and no guarding.  Musculoskeletal: He exhibits edema.  Bilateral lower extremity venous stasis with mild cellulitis and edema. Various ulcers with stages of healing.  Lymphadenopathy:    He has no cervical adenopathy.  Neurological: He is alert and oriented to person, place, and time.  Skin: Skin is warm. There is erythema.    Assessment/Plan: Chronic venous stasis, cellulitis and edema of lower extremities  Recommend: Wound care nurse consultLouretta Parma Boots for now  Outpatient whirlpool therapy, Consistent wound care clinic care, UNNA boots Follow Up outpatient with Dr. Lucky Cowboy for Venous and arterial studies   Jamesetta So A 04/18/2016, 10:24 AM

## 2016-04-19 LAB — BASIC METABOLIC PANEL
ANION GAP: 10 (ref 5–15)
BUN: 11 mg/dL (ref 6–20)
CALCIUM: 9 mg/dL (ref 8.9–10.3)
CO2: 28 mmol/L (ref 22–32)
Chloride: 87 mmol/L — ABNORMAL LOW (ref 101–111)
Creatinine, Ser: 0.77 mg/dL (ref 0.61–1.24)
Glucose, Bld: 123 mg/dL — ABNORMAL HIGH (ref 65–99)
POTASSIUM: 4.6 mmol/L (ref 3.5–5.1)
Sodium: 125 mmol/L — ABNORMAL LOW (ref 135–145)

## 2016-04-19 MED ORDER — SILVER NITRATE-POT NITRATE 75-25 % EX MISC
1.0000 "application " | CUTANEOUS | Status: DC | PRN
  Administered 2016-04-19: 1 via TOPICAL
  Filled 2016-04-19 (×3): qty 1

## 2016-04-19 NOTE — Progress Notes (Signed)
[employer] - [place] [facility]   PATIENT NAME: [name] BrasMichael [name]    MR#:  [id number]  DATE OF BIRTH:  1961-07-05  SUBJECTIVE:   Patient here due to bilateral lower extremity edema with underlying cellulitis and also a bleeding ulcer. Seen by wound care team and unna boots placed and then pt. Started to bleed from his RLE ulcer.  Surgicel placed on the ulcer and it stopped bleeding and now the left side has started to bleed.  Will apply surgicel to that wound too and monitor  REVIEW OF SYSTEMS:    Review of Systems  Constitutional: Negative for fever and chills.  HENT: Negative for congestion and tinnitus.   Eyes: Negative for blurred vision and double vision.  Respiratory: Negative for cough, shortness of breath and wheezing.   Cardiovascular: Positive for leg swelling. Negative for chest pain, orthopnea and PND.  Gastrointestinal: Negative for nausea, vomiting, abdominal pain and diarrhea.  Genitourinary: Negative for dysuria and hematuria.  Neurological: Negative for dizziness, sensory change and focal weakness.  All other systems reviewed and are negative.   Nutrition: Heart Healthy Tolerating Diet: yes Tolerating PT: Ambulatory.      DRUG ALLERGIES:   Allergies  Allergen Reactions  . Clindamycin/Lincomycin Diarrhea    VITALS:  Blood pressure 133/87, pulse 73, temperature 99.7 F (37.6 C), temperature source Oral, resp. rate 18, height 6\' 4"  (1.93 m), weight 140.116 kg (308 lb 14.4 oz), SpO2 98 %.  PHYSICAL EXAMINATION:   Physical Exam  GENERAL:  55 y.o.-year-old obese obese patient sitting up in chair in no acute distress.  EYES: Pupils equal, round, reactive to light and accommodation. No scleral icterus. Extraocular muscles intact.  HEENT: Head atraumatic, normocephalic. Oropharynx and nasopharynx clear.  NECK:  Supple, no jugular venous distention. No thyroid enlargement, no tenderness.  LUNGS: Normal breath sounds bilaterally, no wheezing,  rales, rhonchi. No use of accessory muscles of respiration.  CARDIOVASCULAR: S1, S2 normal. No murmurs, rubs, or gallops.  ABDOMEN: Soft, nontender, nondistended. Bowel sounds present. No organomegaly or mass.  EXTREMITIES: No cyanosis, clubbing, b/l +2-3 LE edema b/l.  LE wrapped in kerlex NEUROLOGIC: Cranial nerves II through XII are intact. No focal Motor or sensory deficits b/l.   PSYCHIATRIC: The patient is alert and oriented x 3.    SKIN: No obvious rash, lesion, or ulcer.    LABORATORY PANEL:   CBC  Recent Labs Lab 04/18/16 0356  WBC 9.8  HGB 13.5  HCT 38.3*  PLT 164   ------------------------------------------------------------------------------------------------------------------  Chemistries   Recent Labs Lab 04/19/16 0838  NA 125*  K 4.6  CL 87*  CO2 28  GLUCOSE 123*  BUN 11  CREATININE 0.77  CALCIUM 9.0   ------------------------------------------------------------------------------------------------------------------  Cardiac Enzymes No results for input(s): TROPONINI in the last 168 hours. ------------------------------------------------------------------------------------------------------------------  RADIOLOGY:  No results found.   ASSESSMENT AND PLAN:   55 year old male with past medical history of hypertension, history of chronic atrial fibrillation, bilateral chronic lower extremity edema, GERD who presented to the hospital due bleeding ulcer in the lower extremity with worsening lower extremity edema.  1. Chronic lower extremity lymphedema with bleeding ulcer-Chronic in nature without evidence of acute cellulitis presently. -Taken off IV antibiotics now. -Appreciate vascular surgery and podiatry input. Seen by wound care and Unna boots placed. Post Unna boots patient started to have bleeding from bilateral ulcers. We'll place Surgicel to see if it stops The bleeding. Appreciate podiatry input. If continues to have bleeding will need to have  further surgical/podiatry input.  2. Chronic a. Fib - rate controlled.  - cont. Sotalol.  Cont. Xarelto.   3. HTN - continue Benazepril.   4. GERD - cont. Protonix.   Possible DC home later today if the bleeding can be controlled and patient clinically stable.  All the records are reviewed and case discussed with Care Management/Social Workerr. Management plans discussed with the patient, family and they are in agreement.  CODE STATUS: Full   DVT Prophylaxis: Xarelto  TOTAL TIME TAKING CARE OF THIS PATIENT: 30 minutes.   POSSIBLE D/C IN 1-2 DAYS, DEPENDING ON CLINICAL CONDITION.   Houston Siren M.D on 04/19/2016 at 2:44 PM  Between 7am to 6pm - Pager - [phone]  After 6pm go to [email or web address] - password [email or web address] [facility]  [place]  [facility]  Office  [phone]  CC: Primary care physician; Corky Downs, MD

## 2016-04-19 NOTE — Progress Notes (Addendum)
Pt left leg bleeding lower calf area. Called Dr Cherlynn KaiserSainani and Dr Alberteen Spindleline. Dr cline recommended surgcel dressing like we did on the right. Will continue to monitor. Currently right side shows no signs of bleeding.  Removed una boot on the left small area lower calf noted to be ulcerated. Very minimal bleeding noted once dressing was taken down. Applied surgcel covered with gauze and applied una boot. Pt tolerated well discharge home pt understands and agrees with plan of care.

## 2016-04-19 NOTE — Progress Notes (Addendum)
Patients right foot bleeding during dressing change. Got order for silver nitrate sticks used x 2. Wound Nurse concerned that patient bleeding may reoccur and asked if patients 1 st dressing change be done at wound clinic as well as getting a bleeding time or change dosing on pt blood thiner.  Called MD orders received appointment made for Friday at 0930 pt agreeable to plan. Dosing change on blood thinner is not an option per Dr Cherlynn KaiserSainani. About 15 min after dressing change patient got up to use the bathroom and his foot started bleeding again wound nurse contacted asked to call doctor. Dr Cherlynn KaiserSainani made aware awaiting Dr Alberteen Spindleline to assess patient. Called OR got surgicel for Dr Alberteen Spindleline. Discharge on hold for now.

## 2016-04-19 NOTE — Care Management Note (Addendum)
Case Management Note  Patient Details  Name: [name] MRN: [id number] Date of Birth: [birth year]  Subjective/Objective:                  Met with patient and his girlfriend to discuss discharge planning. He is independent to the bathroom when I rounded. He has dressings to both lower extremities he and his girlfriend state are having trouble managing the home. Girlfriend works daily. They are interested in home health RN. His PCP is Dr. Lavera Guise. He uses [facility] for Rx. He denies difficulty obtaining meds.   Action/Plan: List of home health agencies left with patient. [place] nurse pending. Patient may be managed as outpatient. No home health needs. Patient follow up with [place] outpatient. Case closed.   Expected Discharge Date:                  Expected Discharge Plan:     In-House Referral:     Discharge planning Services  CM Consult  Post Acute Care Choice:  Home Health Choice offered to:  Patient (girlfriend)  DME Arranged:    DME Agency:     HH Arranged:    [place] Agency:     Status of Service:  In process, will continue to follow  Medicare Important Message Given:    Date Medicare IM Given:    Medicare IM give by:    Date Additional Medicare IM Given:    Additional Medicare Important Message give by:     If discussed at [place] of Stay Meetings, dates discussed:    Additional Comments:  Marshell Garfinkel, RN 04/19/2016, 9:39 AM

## 2016-04-19 NOTE — Progress Notes (Signed)
  Subjective: Patient seen. Had the wound on his right foot cleaned up some this morning and there was some bleeding. Some silver nitrate sticks were applied prior to clot off the bleeding area. This did work for a while but then when he got up to go to the bathroom and walked on the foot he noticed significant bleeding through his Unna boot and bandaging. Medicine requested that we recently the patient and evaluate this.  Objective: Vital signs in last 24 hours: Temp:  [98.1 F (36.7 C)-99.7 F (37.6 C)] 99.7 F (37.6 C) (05/08 0745) Pulse Rate:  [60-73] 73 (05/08 0745) Resp:  [16-18] 18 (05/08 0745) BP: (114-133)/(67-87) 133/87 mmHg (05/08 0745) SpO2:  [97 %-100 %] 98 % (05/08 0745) Last BM Date: 04/18/16  Intake/Output from previous day: 05/07 0701 - 05/08 0700 In: 240 [P.O.:240] Out: 0  Intake/Output this shift:    Significant acute and active bleeding noted from the lateral aspect of the right midfoot region. Just a small localized pinpoint area which appears to be a disrupted blood vessel.  Lab Results:   Recent Labs  04/17/16 1802 04/18/16 0356  WBC 11.8* 9.8  HGB 13.6 13.5  HCT 39.4* 38.3*  PLT 193 164   BMET  Recent Labs  04/18/16 0356 04/19/16 0838  NA 124* 125*  K 4.3 4.6  CL 87* 87*  CO2 27 28  GLUCOSE 144* 123*  BUN 12 11  CREATININE 0.72 0.77  CALCIUM 8.4* 9.0   PT/INR No results for input(s): LABPROT, INR in the last 72 hours. ABG No results for input(s): PHART, HCO3 in the last 72 hours.  Invalid input(s): PCO2, PO2  Studies/Results: No results found.  Anti-infectives: Anti-infectives    Start     Dose/Rate Route Frequency Ordered Stop   04/19/16 1000  cefTRIAXone (ROCEPHIN) 1 g in dextrose 5 % 50 mL IVPB  Status:  Discontinued     1 g 100 mL/hr over 30 Minutes Intravenous  Once 04/18/16 0132 04/18/16 1257   04/18/16 0145  cefTRIAXone (ROCEPHIN) 1 g in dextrose 5 % 50 mL IVPB  Status:  Discontinued     1 g 100 mL/hr over 30 Minutes  Intravenous Every 24 hours 04/18/16 0132 04/18/16 1257   04/17/16 1915  clindamycin (CLEOCIN) IVPB 600 mg     600 mg 100 mL/hr over 30 Minutes Intravenous  Once 04/17/16 1912 04/17/16 2046      Assessment/Plan: s/p * No surgery found * Assessment: Chronic venous stasis with ulcerations with active bleeding right foot   Plan: The Unna boot was removed and the wound assessed. Surgicel was applied to the bleeding area on the lateral aspect of the right foot and after 4-5 minutes of pressure with a gauze the bleeding had stopped. Instructions given for re-wrapping the foot with a second layer of Surgicel and then dry gauze. He will follow up with the wound care center later this week as scheduled as he is supposed to be discharged today  LOS: 2 days    [name] 04/19/2016

## 2016-04-19 NOTE — Consult Note (Signed)
WOC wound consult note Reason for Consult:Bilateral Unnas boots with chronic venous ulcers.  Recent bleeding episode to right lateral foot, resulting in sutures.  Bleeding in a different location on right lateral foot today with wound care. Silver nitrate x 2.   Wound type:Chronic venous wounds with heavily bleeding lesions.  Unable to cauterize with silver nitrate and pressure dressing.   Pressure Ulcer POA: N/A Measurement:Left dorsal leg 2 cm x 2 cm x 0.2 cm ruddy wound bed Blistered lesion to right anterior leg 3 cm x 3 cm x 0.1 cm  Right lateral foot with 1 cm suture line.  With cleansing, new area began to bleed.  Pressure applied for 15 mins and silver hydrofiber applied.  Unnas boot applied with extra padding.  Bleeding noted within 5 minutes.  Dressing is removed and pressure applied for 10 minutes with no improvement.  Silver nitrate cauterization x 2 and silver hydrofiber applied to wound bed.  Bleeding appeared to subside at that point.  Left leg cleansed and silver hydrofiber  Applied to anterior and posterior lower leg.  Unnas boot applied without incident to left leg.  Wound ZOX:WRUEAbed:Ruddy red Drainage (amount, consistency, odor) Moderate weeping to lower legs Heavy bleeding to opening on right lateral foot. Silver nitrate x 2 and wound begins to bleed again within 20 minutes.  Wound care RN was off the unit at that time.  Asked bedside RN to notify MD.  This area may need suturing as well.   Periwound: Chronic skin changes.  Serous weeping.  Dressing procedure/placement/frequency:Legs were cleansed with soap and water.  Aquacel Ag to nonintact lesions.  Wrap with zinc layer , secured with Coban..  Follow up appointment scheduled for Friday. MD notified to assess regarding bleeding to right foot.  Will not follow at this time.  Please re-consult if needed.  Maple HudsonKaren Johnell Bas RN BSN CWON Pager [phone]

## 2016-04-19 NOTE — Progress Notes (Signed)
Home meds returned to the pt

## 2016-04-20 ENCOUNTER — Ambulatory Visit: Payer: BC Managed Care – PPO

## 2016-04-20 NOTE — Discharge Summary (Signed)
[facility] - [place] at [facility]   PATIENT NAME: [name]    MR#:  [id number]  DATE OF BIRTH:  1961-04-02  DATE OF ADMISSION:  04/17/2016 ADMITTING PHYSICIAN: Wyatt Haste, MD  DATE OF DISCHARGE: 04/19/2016  2:50 PM  PRIMARY CARE PHYSICIAN: MASOUD,[name], MD    ADMISSION DIAGNOSIS:  Bleeding [R58] Hyponatremia [E87.1] Cellulitis of right leg [L03.115] Laceration of right foot, initial encounter [S91.311A]  DISCHARGE DIAGNOSIS:  Active Problems:   Hyponatremia   SECONDARY DIAGNOSIS:   Past Medical History  Diagnosis Date  . Hypertension   . Heart murmur   . Anal fissure   . Personal history of colonic polyps     HOSPITAL COURSE:   56 year old male with past medical history of hypertension, history of chronic atrial fibrillation, bilateral chronic lower extremity edema, GERD who presented to the hospital due bleeding ulcer in the lower extremity with worsening lower extremity edema.  1. 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 This has not been ruled out and patient has been taken off antibiotics. -Patient was seen by Vascular surgery and also podiatry. They recommended local wound care and therefore patient was seen by the wound nurse and Unna boots were placed. Post Unna boots placement patient started having bleeding from the ulcers. Surgicel was applied to the ulcers which stopped the bleeding. -Patient was discharged on the Unna boots and follow up at the wound center as outpatient.  2. Chronic a. Fib -he remained rate controlled while in the hospital.  - he will cont. Sotalol. he will Cont. Xarelto.   3. HTN - He will continue Benazepril.   4. GERD - He will cont. Omeprazole  DISCHARGE CONDITIONS:   Stable  CONSULTS OBTAINED:  Treatment Team:  Bertram Denver, MD  DRUG ALLERGIES:   Allergies  Allergen  Reactions  . Clindamycin/Lincomycin Diarrhea    DISCHARGE MEDICATIONS:   Discharge Medication List as of 04/19/2016  1:44 PM    CONTINUE these medications which have NOT CHANGED   Details  benazepril (LOTENSIN) 40 MG tablet Take 40 mg by mouth daily. , Starting 02/12/2016, Until Discontinued, Historical Med    naproxen sodium (ALEVE) 220 MG tablet Take 440 mg by mouth 2 (two) times daily with a meal., Until Discontinued, Historical Med    omeprazole (PRILOSEC) 20 MG capsule Take 20 mg by mouth daily as needed (for heartburn/indigestion.). , Until Discontinued, Historical Med    sotalol (BETAPACE) 80 MG tablet Take 120 mg by mouth 2 (two) times daily. , Starting 01/27/2016, Until Discontinued, Historical Med    XARELTO 20 MG TABS tablet Take 20 mg by mouth every morning. , Starting 02/02/2016, Until Discontinued, Historical Med      STOP taking these medications     amLODipine (NORVASC) 10 MG tablet      ampicillin (PRINCIPEN) 500 MG capsule      montelukast (SINGULAIR) 10 MG tablet          DISCHARGE INSTRUCTIONS:   DIET:  Cardiac diet  DISCHARGE CONDITION:  Stable  ACTIVITY:  Activity as tolerated  OXYGEN:  Home Oxygen: No.   Oxygen Delivery: room air  DISCHARGE LOCATION:  home   If you experience worsening of your admission symptoms, develop shortness of breath, life threatening emergency, suicidal or homicidal thoughts you must seek medical attention immediately by calling 911 or calling your MD immediately  if symptoms less severe.  You Must read complete instructions/literature along with all the possible adverse reactions/side effects for all the Medicines you take and that have been prescribed to you. Take any new Medicines after you have completely understood and accpet all the possible adverse reactions/side effects.   Please note  You were cared for by a hospitalist during your hospital stay. If you have any questions about your discharge medications or  the care you received while you were in the hospital after you are discharged, you can call the unit and asked to speak with the hospitalist on call if the hospitalist that took care of you is not available. Once you are discharged, your primary care physician will handle any further medical issues. Please note that NO REFILLS for any discharge medications will be authorized once you are discharged, as it is imperative that you return to your primary care physician (or establish a relationship with a primary care physician if you do not have one) for your aftercare needs so that they can reassess your need for medications and monitor your lab values.    Management plans discussed with the patient, family and they are in agreement.  CODE STATUS:  Code Status History    Date Active Date Inactive Code Status Order ID Comments User Context   04/17/2016  7:26 PM 04/19/2016  5:50 PM Full Code [id number]  Wyatt Hasteavid K Hower, MD ED      TOTAL TIME TAKING CARE OF THIS PATIENT: 40 minutes.    Houston SirenSAINANI,VIVEK J M.D on 04/20/2016 at 4:45 PM  Between 7am to 6pm - Pager - [phone]  After 6pm go to [email or web address] - password [email or web address] [facility]  [facility]  Office  [phone]  CC: Primary care physician; Corky DownsMASOUD,[name], MD

## 2016-04-23 ENCOUNTER — Encounter: Payer: BC Managed Care – PPO | Attending: Surgery | Admitting: Surgery

## 2016-04-23 DIAGNOSIS — I10 Essential (primary) hypertension: Secondary | ICD-10-CM | POA: Insufficient documentation

## 2016-04-23 DIAGNOSIS — I482 Chronic atrial fibrillation: Secondary | ICD-10-CM | POA: Diagnosis not present

## 2016-04-23 DIAGNOSIS — I87031 Postthrombotic syndrome with ulcer and inflammation of right lower extremity: Secondary | ICD-10-CM | POA: Diagnosis not present

## 2016-04-23 DIAGNOSIS — Z7901 Long term (current) use of anticoagulants: Secondary | ICD-10-CM | POA: Insufficient documentation

## 2016-04-23 DIAGNOSIS — I87032 Postthrombotic syndrome with ulcer and inflammation of left lower extremity: Secondary | ICD-10-CM | POA: Diagnosis not present

## 2016-04-23 DIAGNOSIS — I890 Lymphedema, not elsewhere classified: Secondary | ICD-10-CM | POA: Diagnosis present

## 2016-04-23 DIAGNOSIS — K219 Gastro-esophageal reflux disease without esophagitis: Secondary | ICD-10-CM | POA: Insufficient documentation

## 2016-04-23 DIAGNOSIS — L97811 Non-pressure chronic ulcer of other part of right lower leg limited to breakdown of skin: Secondary | ICD-10-CM | POA: Insufficient documentation

## 2016-04-23 DIAGNOSIS — L97521 Non-pressure chronic ulcer of other part of left foot limited to breakdown of skin: Secondary | ICD-10-CM | POA: Insufficient documentation

## 2016-04-23 DIAGNOSIS — F17218 Nicotine dependence, cigarettes, with other nicotine-induced disorders: Secondary | ICD-10-CM | POA: Insufficient documentation

## 2016-04-24 NOTE — Progress Notes (Addendum)
CALIB, WADHWA ([id number]) Visit Report for 04/23/2016 Allergy List Details Patient Name: DRAYDON, CLAIRMONT Date of Service: 04/23/2016 9:30 AM Medical Record Number: [id number] Patient Account Number: [id number] Date of Birth/Sex: January 18, 1961 (54 y.o. Male) Treating RN: Phillis Haggis Primary Care Physician: Corky Downs Other Clinician: Referring Physician: Corky Downs Treating Physician/Extender: Rudene Re in Treatment: 0 Allergies Active Allergies NKDA Allergy Notes Electronic Signature(s) Signed: 04/23/2016 5:12:54 PM By: Alejandro Mulling Entered By: Alejandro Mulling on 04/23/2016 10:09:05 Wulf, [name] ([id number]) -------------------------------------------------------------------------------- Arrival Information Details Patient Name: [name] Date of Service: 04/23/2016 9:30 AM Medical Record Number: [id number] Patient Account Number: [id number] Date of Birth/Sex: [birth year] (55 y.o. Male) Treating RN: Phillis Haggis Primary Care Physician: Corky Downs Other Clinician: Referring Physician: Corky Downs Treating Physician/Extender: Rudene Re in Treatment: 0 Visit Information Patient Arrived: Ambulatory Arrival Time: 10:03 Accompanied By: girlfriend Transfer Assistance: None Patient Identification Verified: Yes Secondary Verification Process Yes Completed: Patient Requires Transmission- No Based Precautions: Patient Has Alerts: Yes Patient Alerts: Patient on Blood Thinner [name] Electronic Signature(s) Signed: 04/23/2016 5:12:54 PM By: Alejandro Mulling Entered By: Alejandro Mulling on 04/23/2016 10:06:51 Storrs, [name] ([id number]) -------------------------------------------------------------------------------- Clinic Level of Care Assessment Details Patient Name: [name] Date of Service: 04/23/2016 9:30 AM Medical Record Number: [id number] Patient Account Number: [id number] Date of Birth/Sex:  01/11/1961 (55 y.o. Male) Treating RN: Phillis Haggis Primary Care Physician: Corky Downs Other Clinician: Referring Physician: Corky Downs Treating Physician/Extender: Rudene Re in Treatment: 0 Clinic Level of Care Assessment Items TOOL 2 Quantity Score X - Use when only an EandM is performed on the INITIAL visit 1 0 ASSESSMENTS - Nursing Assessment / Reassessment X - General Physical Exam (combine w/ comprehensive assessment (listed just 1 20 below) when performed on new pt. evals) X - Comprehensive Assessment (HX, ROS, Risk Assessments, Wounds Hx, etc.) 1 25 ASSESSMENTS - Wound and Skin Assessment / Reassessment  - Simple Wound Assessment / Reassessment - one wound 0 X - Complex Wound Assessment / Reassessment - multiple wounds 4 5  - Dermatologic / Skin Assessment (not related to wound area) 0 ASSESSMENTS - Ostomy and/or Continence Assessment and Care  - Incontinence Assessment and Management 0  - Ostomy Care Assessment and Management (repouching, etc.) 0 PROCESS - Coordination of Care  - Simple Patient / Family Education for ongoing care 0 X - Complex (extensive) Patient / Family Education for ongoing care 1 20 X - Staff obtains Chiropractor, Records, Test Results / Process Orders 1 10 X - Staff telephones HHA, Nursing Homes / Clarify orders / etc 1 10  - Routine Transfer to another Facility (non-emergent condition) 0  - Routine Hospital Admission (non-emergent condition) 0 X - New Admissions / Manufacturing engineer / Ordering NPWT, Apligraf, etc. 1 15  - Emergency Hospital Admission (emergent condition) 0 X - Simple Discharge Coordination 1 10 Dosch, Chin J. ([id number])  - Complex (extensive) Discharge Coordination 0 PROCESS - Special Needs  - Pediatric / Minor Patient Management 0  - Isolation Patient Management 0  - Hearing / Language / Visual special needs 0  - Assessment of Community assistance (transportation, D/C planning,  etc.) 0  - Additional assistance / Altered mentation 0  - Support Surface(s) Assessment (bed, cushion, seat, etc.) 0 INTERVENTIONS - Wound Cleansing / Measurement X - Wound Imaging (photographs - any number of wounds) 1 5  - Wound Tracing (instead of photographs) 0  - Simple Wound Measurement - one wound 0 X - Complex Wound Measurement -  multiple wounds 4 5 []  - Simple Wound Cleansing - one wound 0 X - Complex Wound Cleansing - multiple wounds 4 5 INTERVENTIONS - Wound Dressings []  - Small Wound Dressing one or multiple wounds 0 []  - Medium Wound Dressing one or multiple wounds 0 X - Large Wound Dressing one or multiple wounds 4 20 X - Application of Medications - injection 1 10 INTERVENTIONS - Miscellaneous []  - External ear exam 0 []  - Specimen Collection (cultures, biopsies, blood, body fluids, etc.) 0 []  - Specimen(s) / Culture(s) sent or taken to Lab for analysis 0 []  - Patient Transfer (multiple staff / Michiel Sites Lift / Similar devices) 0 []  - Simple Staple / Suture removal (25 or less) 0 []  - Complex Staple / Suture removal (26 or more) 0 Necaise, Wess J. ([id number]) []  - Hypo / Hyperglycemic Management (close monitor of Blood Glucose) 0 X - Ankle / Brachial Index (ABI) - do not check if billed separately 1 15 Has the patient been seen at the hospital within the last three years: Yes Total Score: 280 Level Of Care: New/Established - Level 5 Electronic Signature(s) Signed: 04/23/2016 5:12:54 PM By: Alejandro Mulling Entered By: Alejandro Mulling on 04/23/2016 12:12:56 Hagins, [name] ([id number]) -------------------------------------------------------------------------------- Encounter Discharge Information Details Patient Name: [name] Date of Service: 04/23/2016 9:30 AM Medical Record Number: [id number] Patient Account Number: [id number] Date of Birth/Sex: Mar 25, 1961 (55 y.o. Male) Treating RN: Phillis Haggis Primary Care Physician: Corky Downs Other  Clinician: Referring Physician: Corky Downs Treating Physician/Extender: Rudene Re in Treatment: 0 Encounter Discharge Information Items Discharge Pain Level: 0 Discharge Condition: Stable Ambulatory Status: Ambulatory Discharge Destination: Home Private Transportation: Auto Accompanied By: wife Schedule Follow-up Appointment: Yes Medication Reconciliation completed and No provided to Patient/Care Daziyah Cogan: Clinical Summary of Care: Electronic Signature(s) Signed: 04/23/2016 5:12:54 PM By: Alejandro Mulling Previous Signature: 04/23/2016 12:00:23 PM Version By: Gwenlyn Perking Entered By: Alejandro Mulling on 04/23/2016 12:00:56 Conyer, [name] ([id number]) -------------------------------------------------------------------------------- Lower Extremity Assessment Details Patient Name: [name] Date of Service: 04/23/2016 9:30 AM Medical Record Number: [id number] Patient Account Number: [id number] Date of Birth/Sex: 14-Jun-1961 (55 y.o. Male) Treating RN: Phillis Haggis Primary Care Physician: Corky Downs Other Clinician: Referring Physician: Corky Downs Treating Physician/Extender: Rudene Re in Treatment: 0 Edema Assessment Assessed: [Left: No] [Right: No] Edema: [Left: Yes] [Right: Yes] Calf Left: Right: Point of Measurement: 38 cm From Medial Instep 50.6 cm 51 cm Ankle Left: Right: Point of Measurement: 13 cm From Medial Instep 29 cm 30.5 cm Vascular Assessment Pulses: Posterior Tibial Dorsalis Pedis Palpable: [Left:No] [Right:No] Doppler: [Left:Monophasic] [Right:Multiphasic] Extremity colors, hair growth, and conditions: Extremity Color: [Left:Red] [Right:Red] Hair Growth on Extremity: [Left:Yes] [Right:Yes] Temperature of Extremity: [Left:Warm] [Right:Warm] Capillary Refill: [Left:> 3 seconds] [Right:> 3 seconds] Blood Pressure: Brachial: [Left:116] [Right:116] Dorsalis Pedis: 130 [Left:Dorsalis Pedis:  130] Ankle: Posterior Tibial: [Left:Posterior Tibial: 1.12] [Right:1.12] Toe Nail Assessment Left: Right: Thick: Yes Yes Discolored: Yes Yes Deformed: Yes Yes Improper Length and Hygiene: Yes Yes Electronic Signature(s) XAVION, MUSCAT ([id number]) Signed: 04/23/2016 5:12:54 PM By: Alejandro Mulling Entered By: Alejandro Mulling on 04/23/2016 11:04:16 Whinery, [name] ([id number]) -------------------------------------------------------------------------------- Multi Wound Chart Details Patient Name: [name] Date of Service: 04/23/2016 9:30 AM Medical Record Number: [id number] Patient Account Number: [id number] Date of Birth/Sex: Sep 18, 1961 (55 y.o. Male) Treating RN: Phillis Haggis Primary Care Physician: Corky Downs Other Clinician: Referring Physician: Corky Downs Treating Physician/Extender: Rudene Re in Treatment: 0 Vital Signs Height(in): 76 Pulse(bpm): 70 Weight(lbs): 320 Blood Pressure  116/80 (mmHg): Body Mass Index(BMI): 39 Temperature(F): 97.9 Respiratory Rate 20 (breaths/min): Photos: [1:No Photos] [2:No Photos] [3:No Photos] Wound Location: [1:Right Lower Leg - Circumfernential] [2:Right Lower Leg - Circumfernential] [3:Left Foot - Circumfernential] Wounding Event: [1:Gradually Appeared] [2:Gradually Appeared] [3:Gradually Appeared] Primary Etiology: [1:Venous Leg Ulcer] [2:Venous Leg Ulcer] [3:Vasculitis] Comorbid History: [1:Arrhythmia, Hypertension] [2:Arrhythmia, Hypertension] [3:Arrhythmia, Hypertension] Date Acquired: [1:12/14/2015] [2:12/14/2015] [3:12/14/2015] Weeks of Treatment: [1:0] [2:0] [3:0] Wound Status: [1:Open] [2:Open] [3:Open] Measurements L x W x D 17x26x0.1 [2:19x30.6x0.1] [3:9x27x0.1] (cm) Area (cm) : [1:347.146] [2:456.63] [3:190.852] Volume (cm) : [1:34.715] [2:45.663] [3:19.085] % Reduction in Area: [1:N/A] [2:0.00%] [3:N/A] % Reduction in Volume: N/A [2:0.00%] [3:N/A] Classification: [1:Partial  Thickness] [2:Partial Thickness] [3:Partial Thickness] Exudate Amount: [1:Large] [2:Large] [3:Large] Exudate Type: [1:Serous] [2:Serous] [3:Serous] Exudate Color: [1:amber] [2:amber] [3:amber] Wound Margin: [1:Flat and Intact] [2:Flat and Intact] [3:Flat and Intact] Granulation Amount: [1:Large (67-100%)] [2:Large (67-100%)] [3:Large (67-100%)] Granulation Quality: [1:Red] [2:Red, Pink] [3:Red, Pink] Necrotic Amount: [1:Small (1-33%)] [2:Small (1-33%)] [3:None Present (0%)] Exposed Structures: [1:Fascia: No Fat: No Tendon: No Muscle: No Joint: No Bone: No] [2:Fascia: No Fat: No Tendon: No Muscle: No Joint: No Bone: No] [3:Fascia: No Fat: No Tendon: No Muscle: No Joint: No Bone: No] Limited to Skin Limited to Skin Limited to Skin Breakdown Breakdown Breakdown Epithelialization: None None None Periwound Skin Texture: Edema: Yes Edema: Yes Edema: Yes Periwound Skin Maceration: Yes No Abnormalities Noted Maceration: Yes Moisture: Moist: Yes Moist: Yes Periwound Skin Color: Erythema: Yes Erythema: Yes Erythema: Yes Erythema Location: Circumferential Circumferential Circumferential Temperature: No Abnormality No Abnormality No Abnormality Tenderness on Yes Yes Yes Palpation: Wound Preparation: Ulcer Cleansing: Other: Ulcer Cleansing: Other: Ulcer Cleansing: Other: soap and water soap and water soap and water Topical Anesthetic Topical Anesthetic Topical Anesthetic Applied: None Applied: Other: lidocaine Applied: None % Assessment Notes: N/A N/A N/A Wound Number: 4 N/A N/A Photos: No Photos N/A N/A Wound Location: Right Foot - N/A N/A Circumfernential Wounding Event: Gradually Appeared N/A N/A Primary Etiology: Vasculitis N/A N/A Comorbid History: Arrhythmia, Hypertension N/A N/A Date Acquired: 12/14/2015 N/A N/A Weeks of Treatment: 0 N/A N/A Wound Status: Open N/A N/A Measurements L x W x D 10x28x0.1 N/A N/A (cm) Area (cm) : 219.911 N/A N/A Volume (cm) : 21.991 N/A N/A %  Reduction in Area: N/A N/A N/A % Reduction in Volume: N/A N/A N/A Classification: Partial Thickness N/A N/A Exudate Amount: Large N/A N/A Exudate Type: Serous N/A N/A Exudate Color: amber N/A N/A Wound Margin: Flat and Intact N/A N/A Granulation Amount: Large (67-100%) N/A N/A Granulation Quality: Red, Pink N/A N/A Necrotic Amount: None Present (0%) N/A N/A Exposed Structures: Fascia: No N/A N/A Fat: No Tendon: No Muscle: No Joint: No Bone: No Toepfer, Keilon J. ([id number]) Limited to Skin Breakdown Epithelialization: None N/A N/A Periwound Skin Texture: Edema: Yes N/A N/A Periwound Skin Maceration: Yes N/A N/A Moisture: Moist: Yes Periwound Skin Color: Erythema: Yes N/A N/A Erythema Location: Circumferential N/A N/A Temperature: No Abnormality N/A N/A Tenderness on Yes N/A N/A Palpation: Wound Preparation: Ulcer Cleansing: Other: N/A N/A soap and water Topical Anesthetic Applied: None Assessment Notes: 2 sutures in place N/A N/A Treatment Notes Electronic Signature(s) Signed: 04/23/2016 5:12:54 PM By: Alejandro MullingPinkerton, Debra Entered By: Alejandro MullingPinkerton, Debra on 04/23/2016 11:22:54 Reim, [name] ([id number]) -------------------------------------------------------------------------------- Multi-Disciplinary Care Plan Details Patient Name: [name] RouteAMERON, Sydney J. Date of Service: 04/23/2016 9:30 AM Medical Record Number: [id number] Patient Account Number: [id number] Date of Birth/Sex: Jul 22, 1961 [age] y.o. Male) Treating RN: Phillis HaggisPinkerton, Debi Primary Care Physician: Corky DownsMASOUD, [name] Other Clinician:  Referring Physician: Corky Downs Treating Physician/Extender: Rudene Re in Treatment: 0 Active Inactive Orientation to the Wound Care Program Nursing Diagnoses: Knowledge deficit related to the wound healing center program Goals: Patient/caregiver will verbalize understanding of the Wound Healing Center Program Date Initiated: 04/23/2016 Goal Status:  Active Interventions: Provide education on orientation to the wound center Notes: Pain, Acute or Chronic Nursing Diagnoses: Pain, acute or chronic: actual or potential Potential alteration in comfort, pain Goals: Patient will verbalize adequate pain control and receive pain control interventions during procedures as needed Date Initiated: 04/23/2016 Goal Status: Active Patient/caregiver will verbalize adequate pain control between visits Date Initiated: 04/23/2016 Goal Status: Active Interventions: Assess comfort goal upon admission Complete pain assessment as per visit requirements Notes: Wound/Skin Impairment Graffeo, Zannie Shela Commons ([id number]) Nursing Diagnoses: Impaired tissue integrity Knowledge deficit related to smoking impact on wound healing Knowledge deficit related to ulceration/compromised skin integrity Goals: Ulcer/skin breakdown will have a volume reduction of 30% by week 4 Date Initiated: 04/23/2016 Goal Status: Active Ulcer/skin breakdown will have a volume reduction of 50% by week 8 Date Initiated: 04/23/2016 Goal Status: Active Ulcer/skin breakdown will have a volume reduction of 80% by week 12 Date Initiated: 04/23/2016 Goal Status: Active Interventions: Assess ulceration(s) every visit Notes: Electronic Signature(s) Signed: 04/23/2016 5:12:54 PM By: Alejandro Mulling Entered By: Alejandro Mulling on 04/23/2016 12:14:35 Deyoe, [name] ([id number]) -------------------------------------------------------------------------------- Pain Assessment Details Patient Name: [name] Date of Service: 04/23/2016 9:30 AM Medical Record Number: [id number] Patient Account Number: [id number] Date of Birth/Sex: 09/09/1961 (55 y.o. Male) Treating RN: Phillis Haggis Primary Care Physician: Corky Downs Other Clinician: Referring Physician: Corky Downs Treating Physician/Extender: Rudene Re in Treatment: 0 Active Problems Location of Pain Severity  and Description of Pain Patient Has Paino Yes Site Locations Pain Location: Pain in Ulcers Rate the pain. Current Pain Level: 4 Character of Pain Describe the Pain: Tender Pain Management and Medication Current Pain Management: Electronic Signature(s) Signed: 04/23/2016 5:12:54 PM By: Alejandro Mulling Entered By: Alejandro Mulling on 04/23/2016 10:07:29 Spagnuolo, [name] ([id number]) -------------------------------------------------------------------------------- Patient/Caregiver Education Details Patient Name: [name] Date of Service: 04/23/2016 9:30 AM Medical Record Number: [id number] Patient Account Number: [id number] Date of Birth/Gender: 09-01-1961 (54 y.o. Male) Treating RN: Phillis Haggis Primary Care Physician: Corky Downs Other Clinician: Referring Physician: Corky Downs Treating Physician/Extender: Rudene Re in Treatment: 0 Education Assessment Education Provided To: Patient Education Topics Provided Wound/Skin Impairment: Handouts: Other: do not get wraps wet Electronic Signature(s) Signed: 04/23/2016 5:12:54 PM By: Alejandro Mulling Entered By: Alejandro Mulling on 04/23/2016 12:00:33 Coach, [name] ([id number]) -------------------------------------------------------------------------------- Wound Assessment Details Patient Name: [name] Date of Service: 04/23/2016 9:30 AM Medical Record Number: [id number] Patient Account Number: [id number] Date of Birth/Sex: 02-03-1961 (55 y.o. Male) Treating RN: Phillis Haggis Primary Care Physician: Corky Downs Other Clinician: Referring Physician: Corky Downs Treating Physician/Extender: Rudene Re in Treatment: 0 Wound Status Wound Number: 1 Primary Etiology: Venous Leg Ulcer Wound Location: Right Lower Leg - Wound Status: Open Circumfernential Comorbid History: Arrhythmia, Hypertension Wounding Event: Gradually Appeared Date Acquired: 12/14/2015 Weeks Of  Treatment: 0 Clustered Wound: No Wound Measurements Length: (cm) 17 Width: (cm) 26 Depth: (cm) 0.1 Area: (cm) 347.146 Volume: (cm) 34.715 % Reduction in Area: % Reduction in Volume: Epithelialization: None Tunneling: No Undermining: No Wound Description Classification: Partial Thickness Foul Odor Afte Wound Margin: Flat and Intact Exudate Amount: Large Exudate Type: Serous Exudate Color: amber r Cleansing: No Wound Bed Granulation Amount: Large (67-100%) Exposed Structure Granulation Quality:  Red Fascia Exposed: No Necrotic Amount: Small (1-33%) Fat Layer Exposed: No Necrotic Quality: Adherent Slough Tendon Exposed: No Muscle Exposed: No Joint Exposed: No Bone Exposed: No Limited to Skin Breakdown Periwound Skin Texture Texture Color No Abnormalities Noted: No No Abnormalities Noted: No Localized Edema: Yes Erythema: Yes Erythema Location: Circumferential Moisture No Abnormalities Noted: No Temperature / Pain Hypes, Jax J. ([id number]) Maceration: Yes Temperature: No Abnormality Moist: Yes Tenderness on Palpation: Yes Wound Preparation Ulcer Cleansing: Other: soap and water, Topical Anesthetic Applied: None Electronic Signature(s) Signed: 04/23/2016 5:12:54 PM By: Alejandro Mulling Entered By: Alejandro Mulling on 04/23/2016 11:06:52 Tischer, [name] ([id number]) -------------------------------------------------------------------------------- Wound Assessment Details Patient Name: [name] Date of Service: 04/23/2016 9:30 AM Medical Record Number: [id number] Patient Account Number: [id number] Date of Birth/Sex: 1961-04-12 (55 y.o. Male) Treating RN: Phillis Haggis Primary Care Physician: Corky Downs Other Clinician: Referring Physician: Corky Downs Treating Physician/Extender: Rudene Re in Treatment: 0 Wound Status Wound Number: 2 Primary Etiology: Venous Leg Ulcer Wound Location: Right Lower Leg - Wound Status:  Open Circumfernential Comorbid History: Arrhythmia, Hypertension Wounding Event: Gradually Appeared Date Acquired: 12/14/2015 Weeks Of Treatment: 0 Clustered Wound: No Wound Measurements Length: (cm) 19 Width: (cm) 30.6 Depth: (cm) 0.1 Area: (cm) 456.63 Volume: (cm) 45.663 % Reduction in Area: 0% % Reduction in Volume: 0% Epithelialization: None Tunneling: No Undermining: No Wound Description Classification: Partial Thickness Wound Margin: Flat and Intact Exudate Amount: Large Exudate Type: Serous Exudate Color: amber Wound Bed Granulation Amount: Large (67-100%) Exposed Structure Granulation Quality: Red, Pink Fascia Exposed: No Necrotic Amount: Small (1-33%) Fat Layer Exposed: No Necrotic Quality: Adherent Slough Tendon Exposed: No Muscle Exposed: No Joint Exposed: No Bone Exposed: No Limited to Skin Breakdown Periwound Skin Texture Texture Color No Abnormalities Noted: No No Abnormalities Noted: No Localized Edema: Yes Erythema: Yes Erythema Location: Circumferential Moisture No Abnormalities Noted: No Temperature / Pain Spurgin, Lyndell J. ([id number]) Temperature: No Abnormality Tenderness on Palpation: Yes Wound Preparation Ulcer Cleansing: Other: soap and water, Topical Anesthetic Applied: Other: lidocaine %, Electronic Signature(s) Signed: 04/23/2016 5:12:54 PM By: Alejandro Mulling Entered By: Alejandro Mulling on 04/23/2016 11:09:08 Isadore, [name] ([id number]) -------------------------------------------------------------------------------- Wound Assessment Details Patient Name: [name] Date of Service: 04/23/2016 9:30 AM Medical Record Number: [id number] Patient Account Number: [id number] Date of Birth/Sex: 1961-04-13 (55 y.o. Male) Treating RN: Ashok Cordia, Debi Primary Care Physician: Corky Downs Other Clinician: Referring Physician: Corky Downs Treating Physician/Extender: Rudene Re in Treatment: 0 Wound  Status Wound Number: 3 Primary Etiology: Vasculitis Wound Location: Left Foot - Circumfernential Wound Status: Open Wounding Event: Gradually Appeared Comorbid History: Arrhythmia, Hypertension Date Acquired: 12/14/2015 Weeks Of Treatment: 0 Clustered Wound: No Wound Measurements Length: (cm) 9 Width: (cm) 27 Depth: (cm) 0.1 Area: (cm) 190.852 Volume: (cm) 19.085 % Reduction in Area: % Reduction in Volume: Epithelialization: None Tunneling: No Undermining: No Wound Description Classification: Partial Thickness Wound Margin: Flat and Intact Exudate Amount: Large Exudate Type: Serous Exudate Color: amber Foul Odor After Cleansing: No Wound Bed Granulation Amount: Large (67-100%) Exposed Structure Granulation Quality: Red, Pink Fascia Exposed: No Necrotic Amount: None Present (0%) Fat Layer Exposed: No Tendon Exposed: No Muscle Exposed: No Joint Exposed: No Bone Exposed: No Limited to Skin Breakdown Periwound Skin Texture Texture Color No Abnormalities Noted: No No Abnormalities Noted: No Localized Edema: Yes Erythema: Yes Erythema Location: Circumferential Moisture No Abnormalities Noted: No Temperature / Pain Maceration: Yes Temperature: No Abnormality Gillen, Bubba J. ([id number]) Moist: Yes Tenderness on Palpation: Yes Wound Preparation Ulcer  Cleansing: Other: soap and water, Topical Anesthetic Applied: None Electronic Signature(s) Signed: 04/23/2016 5:12:54 PM By: Alejandro Mulling Entered By: Alejandro Mulling on 04/23/2016 11:11:48 Cura, [name] ([id number]) -------------------------------------------------------------------------------- Wound Assessment Details Patient Name: [name] Date of Service: 04/23/2016 9:30 AM Medical Record Number: [id number] Patient Account Number: [id number] Date of Birth/Sex: 01-11-1961 (55 y.o. Male) Treating RN: Phillis Haggis Primary Care Physician: Corky Downs Other Clinician: Referring  Physician: Corky Downs Treating Physician/Extender: Rudene Re in Treatment: 0 Wound Status Wound Number: 4 Primary Etiology: Vasculitis Wound Location: Right Foot - Circumfernential Wound Status: Open Wounding Event: Gradually Appeared Comorbid History: Arrhythmia, Hypertension Date Acquired: 12/14/2015 Weeks Of Treatment: 0 Clustered Wound: No Wound Measurements Length: (cm) 10 Width: (cm) 28 Depth: (cm) 0.1 Area: (cm) 219.911 Volume: (cm) 21.991 % Reduction in Area: % Reduction in Volume: Epithelialization: None Tunneling: No Undermining: No Wound Description Classification: Partial Thickness Wound Margin: Flat and Intact Exudate Amount: Large Exudate Type: Serous Exudate Color: amber Foul Odor After Cleansing: No Wound Bed Granulation Amount: Large (67-100%) Exposed Structure Granulation Quality: Red, Pink Fascia Exposed: No Necrotic Amount: None Present (0%) Fat Layer Exposed: No Tendon Exposed: No Muscle Exposed: No Joint Exposed: No Bone Exposed: No Limited to Skin Breakdown Periwound Skin Texture Texture Color No Abnormalities Noted: No No Abnormalities Noted: No Localized Edema: Yes Erythema: Yes Erythema Location: Circumferential Moisture No Abnormalities Noted: No Temperature / Pain Maceration: Yes Temperature: No Abnormality Busta, Delvonte J. ([id number]) Moist: Yes Tenderness on Palpation: Yes Wound Preparation Ulcer Cleansing: Other: soap and water, Topical Anesthetic Applied: None Assessment Notes [address] Electronic Signature(s) Signed: 04/23/2016 5:12:54 PM By: Alejandro Mulling Entered By: Alejandro Mulling on 04/23/2016 11:13:44 Axon, [name] ([id number]) -------------------------------------------------------------------------------- Vitals Details Patient Name: [name] Date of Service: 04/23/2016 9:30 AM Medical Record Number: [id number] Patient Account Number: [id number] Date of Birth/Sex:  1961/01/23 (55 y.o. Male) Treating RN: Phillis Haggis Primary Care Physician: Corky Downs Other Clinician: Referring Physician: Corky Downs Treating Physician/Extender: Rudene Re in Treatment: 0 Vital Signs Time Taken: 10:07 Temperature (F): 97.9 Height (in): 76 Pulse (bpm): 70 Source: Stated Respiratory Rate (breaths/min): 20 Weight (lbs): 320 Blood Pressure (mmHg): 116/80 Source: Stated Reference Range: 80 - 120 mg / dl Body Mass Index (BMI): 38.9 Electronic Signature(s) Signed: 04/23/2016 5:12:54 PM By: Alejandro Mulling Entered By: Alejandro Mulling on 04/23/2016 10:08:51

## 2016-04-24 NOTE — Progress Notes (Addendum)
SAYEED, WEATHERALL ([id number]) Visit Report for 04/23/2016 Chief Complaint Document Details Patient Name: [name], [name] Date of Service: 04/23/2016 9:30 AM Medical Record Number: [id number] Patient Account Number: [id number] Date of Birth/Sex: Jan 13, 1961 (55 y.o. Male) Treating RN: Phillis Haggis Primary Care Physician: Corky Downs Other Clinician: Referring Physician: Corky Downs Treating Physician/Extender: Rudene Re in Treatment: 0 Information Obtained from: Patient Chief Complaint Patient presents to the wound care center for a consult due non healing wound to both lower extremities and accompanied by swelling and this has been worse for the last 5 months. He says the swelling of both lower extremity has been there for at least 6-7 years. Electronic Signature(s) Signed: 04/30/2016 1:32:11 PM By: Evlyn Kanner MD, FACS Previous Signature: 04/23/2016 11:56:58 AM Version By: Evlyn Kanner MD, FACS Entered By: Evlyn Kanner on 04/30/2016 13:32:11 [name], [name] ([id number]) -------------------------------------------------------------------------------- HPI Details Patient Name: [name] Date of Service: 04/23/2016 9:30 AM Medical Record Number: [id number] Patient Account Number: [id number] Date of Birth/Sex: 10-01-1961 (55 y.o. Male) Treating RN: Phillis Haggis Primary Care Physician: Corky Downs Other Clinician: Referring Physician: Corky Downs Treating Physician/Extender: Rudene Re in Treatment: 0 History of Present Illness Location: ulceration and weeping of both lower extremities left more than the right Quality: Patient reports experiencing heaviness to affected area(s). Severity: Patient states wound are getting worse. Duration: Patient has had the wound for > 5 months prior to seeking treatment at the wound center Timing: Pain in wound is Intermittent (comes and goes Context: The wound would happen gradually Modifying  Factors: Other treatment(s) tried include:has been having weekly wraps applied to both lower extremities at Dr. Zannie Kehr office Associated Signs and Symptoms: Patient reports having increase swelling. HPI Description: 55 year old gentleman who has been treated in the past for venous ulcers of the lower extremity is also known to be a smoker for the last 20 years and smokes about a pack of cigarettes a day. He has been seen by Dr. Evette Cristal who has been treating left lower extremity venous ulcer with an Unna boot and have consulted vascular surgery to be seen by Dr. Gilda Crease. the patient was also recently admitted to the hospital on 04/17/2016 and discharged on 04/19/2016 with bleeding, hyponatremia, cellulitis of the right leg, laceration of the right foot. 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 He was admitted to hospital with significant bleeding from the ulcer and initially started on IV antibiotics for underlying cellulitis which was suspected. Patient was seen by Dr. Evie Lacks of surgery and Dr Alberteen Spindle of podiatry. Dr. Graciela Husbands had done excisional debridement of some of the superficial skin slough as well as the ulcerative area on the posterior aspect of the left ankle region. Unna's boots was applied. He was asked to follow-up at the wound center. He is on Xarelto for his chronic atrial fibrillation. Electronic Signature(s) Signed: 04/30/2016 3:42:54 PM By: Evlyn Kanner MD, FACS Previous Signature: 04/30/2016 1:33:14 PM Version By: Evlyn Kanner MD, FACS Previous Signature: 04/23/2016 11:57:56 AM Version By: Evlyn Kanner MD, FACS Previous Signature: 04/23/2016 10:45:34 AM Version By: Evlyn Kanner MD, FACS Previous Signature: 04/23/2016 10:28:09 AM Version By: Evlyn Kanner MD, FACS Entered By: Evlyn Kanner on 04/30/2016 15:42:54 [name], [name]  ([id number]) -------------------------------------------------------------------------------- Physical Exam Details Patient Name: [name] Date of Service: 04/23/2016 9:30 AM Medical Record Number: [id number] Patient Account Number: [id number] Date of Birth/Sex: 1961-10-28 (55 y.o. Male) Treating RN: Phillis Haggis Primary Care Physician: Juel Burrow,  [name] Other Clinician: Referring Physician: Juel Burrow, [name] Treating Physician/Extender: Rudene Re in Treatment: 0 Constitutional . Pulse regular. Respirations normal and unlabored. Afebrile. . Eyes Nonicteric. Reactive to light. Ears, Nose, Mouth, and Throat Lips, teeth, and gums WNL.Marland Kitchen Moist mucosa without lesions. Neck supple and nontender. No palpable supraclavicular or cervical adenopathy. Normal sized without goiter. Respiratory WNL. No retractions.. Cardiovascular Pedal Pulses WNL. ABI both lower extremities 1.12. he has significant lymphedema stage II both lower extremities.. Chest Breasts symmetical and no nipple discharge.. Breast tissue WNL, no masses, lumps, or tenderness.. Gastrointestinal (GI) Abdomen without masses or tenderness.. No liver or spleen enlargement or tenderness.. Lymphatic No adneopathy. No adenopathy. No adenopathy. Musculoskeletal Adexa without tenderness or enlargement.. Digits and nails w/o clubbing, cyanosis, infection, petechiae, ischemia, or inflammatory conditions.. Integumentary (Hair, Skin) No suspicious lesions. No crepitus or fluctuance. No peri-wound warmth or erythema. No masses.Marland Kitchen Psychiatric Judgement and insight Intact.. No evidence of depression, anxiety, or agitation.. Notes the patient has stage II lymphedema of both lower extremities and has got a lot of excoriation and ulceration left worse than right. No debridement was required today. Electronic Signature(s) Signed: 04/23/2016 11:58:53 AM By: Evlyn Kanner MD, FACS Entered By: Evlyn Kanner on 04/23/2016  11:58:53 [name], [name] ([id number]) [name], [name] ([id number]) -------------------------------------------------------------------------------- Physician Orders Details Patient Name: [name] Date of Service: 04/23/2016 9:30 AM Medical Record Number: [id number] Patient Account Number: [id number] Date of Birth/Sex: 08-Apr-1961 (54 y.o. 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 I87.032 Postthrombotic syndrome with ulcer and inflammation of left lower extremity F17.218 Nicotine dependence, cigarettes, with other nicotine-induced disorders Wound Cleansing Wound #1 Right,Circumferential Lower Leg o Cleanse wound with mild soap and water - when changing the wrap Wound #2 Right,Circumferential Lower Leg o Cleanse wound with mild soap and water - when changing the wrap Wound #3 Left,Circumferential Foot o Cleanse wound with mild soap and water - when changing the wrap Wound #4 Right,Circumferential Foot o Cleanse wound with mild soap and water - when changing the wrap Anesthetic Wound #3 Left,Circumferential Foot o Topical Lidocaine 4% cream applied to wound bed prior to debridement - for office use only Skin Barriers/Peri-Wound Care Wound #1 Right,Circumferential Lower Leg o Barrier cream Wound #2 Right,Circumferential Lower Leg o Barrier cream Wound #3 Left,Circumferential Foot o Barrier cream Wound #4 Right,Circumferential Foot o Barrier cream Cassels, Oluwatobi J. ([id number]) Primary Wound Dressing Wound #1 Right,Circumferential Lower  Leg o Aquacel Ag Wound #2 Right,Circumferential Lower Leg o Aquacel Ag Wound #3 Left,Circumferential Foot o Aquacel Ag Wound #4 Right,Circumferential Foot o Aquacel Ag Secondary Dressing Wound #1 Right,Circumferential Lower Leg o ABD pad o XtraSorb Wound #2 Right,Circumferential Lower Leg o ABD pad o XtraSorb Wound #3 Left,Circumferential Foot o ABD pad o XtraSorb Wound #4 Right,Circumferential Foot o ABD pad o XtraSorb Dressing Change Frequency Wound #1 Right,Circumferential Lower Leg o Three times weekly - Home Health to change wraps Monday and Wednesday Pt is seen in clinic on Friday and is changed then Wound #2 Right,Circumferential Lower Leg o Three times weekly - Home Health to change wraps Monday and Wednesday Pt is seen in clinic on Friday and is changed then Wound #3 Left,Circumferential Foot o Three times weekly -  Home Health to change wraps Monday and Wednesday Pt is seen in clinic on Friday and is changed then Wound #4 Right,Circumferential Foot o Three times weekly - Home Health to change wraps Monday and Wednesday Pt is seen in clinic on Friday and is changed then [name], CREELMAN. ([id number]) Follow-up Appointments Wound #1 Right,Circumferential Lower Leg o Return Appointment in 1 week. Wound #2 Right,Circumferential Lower Leg o Return Appointment in 1 week. Wound #3 Left,Circumferential Foot o Return Appointment in 1 week. Wound #4 Right,Circumferential Foot o Return Appointment in 1 week. Edema Control Wound #1 Right,Circumferential Lower Leg o 3 Layer Compression System - Bilateral - Home Health to change Monday and Wednesday o Elevate legs to the level of the heart and pump ankles as often as possible Wound #2 Right,Circumferential Lower Leg o 3 Layer Compression System - Bilateral - Home Health to change Monday and Wednesday o Elevate legs to the level of the heart and pump ankles as often as  possible Wound #3 Left,Circumferential Foot o 3 Layer Compression System - Bilateral - Home Health to change Monday and Wednesday o Elevate legs to the level of the heart and pump ankles as often as possible Wound #4 Right,Circumferential Foot o 3 Layer Compression System - Bilateral - Home Health to change Monday and Wednesday o Elevate legs to the level of the heart and pump ankles as often as possible Additional Orders / Instructions Wound #1 Right,Circumferential Lower Leg o Stop Smoking o Increase protein intake. Wound #2 Right,Circumferential Lower Leg o Stop Smoking o Increase protein intake. Wound #3 Left,Circumferential Foot o Stop Smoking o Increase protein intake. Wound #4 Right,Circumferential Foot o Stop Smoking o Increase protein intake. Home Health [name], [name] ([id number]) Wound #1 Right,Circumferential Lower Leg o Initiate Home Health for Skilled Nursing o Home Health Nurse may visit PRN to address patientos wound care needs. o FACE TO FACE ENCOUNTER: MEDICARE and MEDICAID PATIENTS: I certify that this patient is under my care and that I had a face-to-face encounter that meets the physician face-to-face encounter requirements with this patient on this date. The encounter with the patient was in whole or in part for the following MEDICAL CONDITION: (primary reason for Home Healthcare) MEDICAL NECESSITY: I certify, that based on my findings, NURSING services are a medically necessary home health service. HOME BOUND STATUS: I certify that my clinical findings support that this patient is homebound (i.e., Due to illness or injury, pt requires aid of supportive devices such as crutches, cane, wheelchairs, walkers, the use of special transportation or the assistance of another person to leave their place of residence. There is a normal inability to leave the home and doing so requires considerable and taxing effort. Other absences are  for medical reasons / religious services and are infrequent or of short duration when for other reasons). o If current dressing causes regression in wound condition, may D/C ordered dressing product/s and apply Normal Saline Moist Dressing daily until next Wound Healing Center / Other MD appointment. Notify Wound Healing Center of regression in wound condition at [phone]. o Please direct any NON-WOUND related issues/requests for orders to patient's Primary Care Physician Wound #2 Right,Circumferential Lower Leg o Initiate Home Health for Skilled Nursing o Home Health Nurse may visit PRN to address patientos wound care needs. o FACE TO FACE ENCOUNTER: MEDICARE and MEDICAID PATIENTS: I certify that this patient is under my care and that I had a face-to-face encounter that meets the physician face-to-face encounter requirements with this  patient on this date. The encounter with the patient was in whole or in part for the following MEDICAL CONDITION: (primary reason for Home Healthcare) MEDICAL NECESSITY: I certify, that based on my findings, NURSING services are a medically necessary home health service. HOME BOUND STATUS: I certify that my clinical findings support that this patient is homebound (i.e., Due to illness or injury, pt requires aid of supportive devices such as crutches, cane, wheelchairs, walkers, the use of special transportation or the assistance of another person to leave their place of residence. There is a normal inability to leave the home and doing so requires considerable and taxing effort. Other absences are for medical reasons / religious services and are infrequent or of short duration when for other reasons). o If current dressing causes regression in wound condition, may D/C ordered dressing product/s and apply Normal Saline Moist Dressing daily until next Wound Healing Center / Other MD appointment. Notify Wound Healing Center of regression in wound  condition at [phone]. o Please direct any NON-WOUND related issues/requests for orders to patient's Primary Care Physician Wound #3 Left,Circumferential Foot o Initiate Home Health for Skilled Nursing o Home Health Nurse may visit PRN to address patientos wound care needs. o FACE TO FACE ENCOUNTER: MEDICARE and MEDICAID PATIENTS: I certify that this patient is under my care and that I had a face-to-face encounter that meets the physician face-to-face encounter requirements with this patient on this date. The encounter with the patient was in whole or in part for the following MEDICAL CONDITION: (primary reason for Home Healthcare) MEDICAL NECESSITY: I certify, that based on my findings, NURSING services are a medically [name], [name] PANIK. ([id number]) necessary home health service. HOME BOUND STATUS: I certify that my clinical findings support that this patient is homebound (i.e., Due to illness or injury, pt requires aid of supportive devices such as crutches, cane, wheelchairs, walkers, the use of special transportation or the assistance of another person to leave their place of residence. There is a normal inability to leave the home and doing so requires considerable and taxing effort. Other absences are for medical reasons / religious services and are infrequent or of short duration when for other reasons). o If current dressing causes regression in wound condition, may D/C ordered dressing product/s and apply Normal Saline Moist Dressing daily until next Wound Healing Center / Other MD appointment. Notify Wound Healing Center of regression in wound condition at [phone]. o Please direct any NON-WOUND related issues/requests for orders to patient's Primary Care Physician Wound #4 Right,Circumferential Foot o Initiate Home Health for Skilled Nursing o Home Health Nurse may visit PRN to address patientos wound care needs. o FACE TO FACE ENCOUNTER: MEDICARE and  MEDICAID PATIENTS: I certify that this patient is under my care and that I had a face-to-face encounter that meets the physician face-to-face encounter requirements with this patient on this date. The encounter with the patient was in whole or in part for the following MEDICAL CONDITION: (primary reason for Home Healthcare) MEDICAL NECESSITY: I certify, that based on my findings, NURSING services are a medically necessary home health service. HOME BOUND STATUS: I certify that my clinical findings support that this patient is homebound (i.e., Due to illness or injury, pt requires aid of supportive devices such as crutches, cane, wheelchairs, walkers, the use of special transportation or the assistance of another person to leave their place of residence. There is a normal inability to leave the home and doing so requires considerable and  taxing effort. Other absences are for medical reasons / religious services and are infrequent or of short duration when for other reasons). o If current dressing causes regression in wound condition, may D/C ordered dressing product/s and apply Normal Saline Moist Dressing daily until next Wound Healing Center / Other MD appointment. Notify Wound Healing Center of regression in wound condition at [phone]. o Please direct any NON-WOUND related issues/requests for orders to patient's Primary Care Physician Electronic Signature(s) Signed: 04/23/2016 3:59:17 PM By: Evlyn Kanner MD, FACS Signed: 04/23/2016 5:12:54 PM By: Alejandro Mulling Entered By: Alejandro Mulling on 04/23/2016 12:15:36 [name], [name] ([id number]) -------------------------------------------------------------------------------- Problem List Details Patient Name: [name] Date of Service: 04/23/2016 9:30 AM Medical Record Number: [id number] Patient Account Number: [id number] Date of Birth/Sex: [birth year] (55 y.o. Male) Treating RN: Phillis Haggis Primary Care Physician:  Corky Downs Other Clinician: Referring Physician: Corky Downs Treating Physician/Extender: Rudene Re in Treatment: 0 Active Problems ICD-10 Encounter Code Description Active Date Diagnosis I89.0 Lymphedema, not elsewhere classified 04/23/2016 Yes I87.031 Postthrombotic syndrome with ulcer and inflammation of 04/23/2016 Yes right lower extremity I87.032 Postthrombotic syndrome with ulcer and inflammation of 04/23/2016 Yes left lower extremity F17.218 Nicotine dependence, cigarettes, with other nicotine- 04/23/2016 Yes induced disorders Inactive Problems Resolved Problems Electronic Signature(s) Signed: 04/30/2016 1:32:00 PM By: Evlyn Kanner MD, FACS Previous Signature: 04/23/2016 11:56:08 AM Version By: Evlyn Kanner MD, FACS Entered By: Evlyn Kanner on 04/30/2016 13:32:00 Castelli, [name] ([id number]) -------------------------------------------------------------------------------- Progress Note Details Patient Name: [name] Date of Service: 04/23/2016 9:30 AM Medical Record Number: [id number] Patient Account Number: [id number] Date of Birth/Sex: 10-Jun-1961 (55 y.o. Male) Treating RN: Phillis Haggis Primary Care Physician: Corky Downs Other Clinician: Referring Physician: Corky Downs Treating Physician/Extender: Rudene Re in Treatment: 0 Subjective Chief Complaint Information obtained from Patient Patient presents to the wound care center for a consult due non healing wound to both lower extremities and accompanied by swelling and this has been worse for the last 5 months. He says the swelling of both lower extremity has been there for at least 6-7 years. History of Present Illness (HPI) The following HPI elements were documented for the patient's wound: Location: ulceration and weeping of both lower extremities left more than the right Quality: Patient reports experiencing heaviness to affected area(s). Severity: Patient states wound  are getting worse. Duration: Patient has had the wound for > 5 months prior to seeking treatment at the wound center Timing: Pain in wound is Intermittent (comes and goes Context: The wound would happen gradually Modifying Factors: Other treatment(s) tried include:has been having weekly wraps applied to both lower extremities at Dr. Zannie Kehr office Associated Signs and Symptoms: Patient reports having increase swelling. 56 year old gentleman who has been treated in the past for venous ulcers of the lower extremity is also known to be a smoker for the last 20 years and smokes about a pack of cigarettes a day. He has been seen by Dr. Evette Cristal who has been treating left lower extremity venous ulcer with an Unna boot and have consulted vascular surgery to be seen by Dr. Gilda Crease. the patient was also recently admitted to the hospital on 04/17/2016 and discharged on 04/19/2016 with bleeding, hyponatremia, cellulitis of the right leg, laceration of the right foot. 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 He was admitted to hospital with significant bleeding from the ulcer and initially started on IV antibiotics for underlying cellulitis which was suspected. Patient  was seen by Dr. Evie Lacks of surgery and Dr Alberteen Spindle of podiatry. Dr. Graciela Husbands had done excisional debridement of some of the superficial skin slough as well as the ulcerative area on the posterior aspect of the left ankle region. Unna's boots was applied. He was asked to follow-up at the wound center. He is on Xarelto for his chronic atrial fibrillation. Wound History Patient presents with 4 open wounds that have been present for approximately 4 months. Patient has been treating wounds in the following manner: silver pads and wraps. The wounds have been healed in the past but have re-opened. Laboratory tests have been performed in the last month. Patient  reportedly has not tested positive for an antibiotic resistant organism. Patient reportedly has not tested positive for MOHD, CLEMONS. ([id number]) osteomyelitis. Patient reportedly has not had testing performed to evaluate circulation in the legs. Patient experiences the following problems associated with their wounds: swelling. Patient History Information obtained from Patient, . Allergies NKDA Family History Cancer - Maternal Grandparents, Diabetes - Siblings, Hypertension - Siblings, No family history of Heart Disease, Hereditary Spherocytosis, Kidney Disease, Lung Disease, Seizures, Stroke, Thyroid Problems, Tuberculosis. Social History Former smoker - less than a pack a day, Marital Status - Single, Alcohol Use - Moderate, Drug Use - No History, Caffeine Use - Daily. Medical History Cardiovascular Patient has history of Arrhythmia - A-FIB, Hypertension Review of Systems (ROS) Constitutional Symptoms (General Health) The patient has no complaints or symptoms. Eyes The patient has no complaints or symptoms. Ear/Nose/Mouth/Throat The patient has no complaints or symptoms. Hematologic/Lymphatic The patient has no complaints or symptoms. Respiratory The patient has no complaints or symptoms. Gastrointestinal anal fissure colon polyps Endocrine The patient has no complaints or symptoms. Genitourinary The patient has no complaints or symptoms. Immunological The patient has no complaints or symptoms. Integumentary (Skin) Complains or has symptoms of Wounds. Musculoskeletal The patient has no complaints or symptoms. Neurologic The patient has no complaints or symptoms. Oncologic The patient has no complaints or symptoms. Psychiatric [name], [name] ([id number]) The patient has no complaints or symptoms. Objective Constitutional Pulse regular. Respirations normal and unlabored. Afebrile. Vitals Time Taken: 10:07 AM, Height: 76 in, Source: Stated, Weight: 320  lbs, Source: Stated, BMI: 38.9, Temperature: 97.9 F, Pulse: 70 bpm, Respiratory Rate: 20 breaths/min, Blood Pressure: 116/80 mmHg. Eyes Nonicteric. Reactive to light. Ears, Nose, Mouth, and Throat Lips, teeth, and gums WNL.Marland Kitchen Moist mucosa without lesions. Neck supple and nontender. No palpable supraclavicular or cervical adenopathy. Normal sized without goiter. Respiratory WNL. No retractions.. Cardiovascular Pedal Pulses WNL. ABI both lower extremities 1.12. he has significant lymphedema stage II both lower extremities.. Chest Breasts symmetical and no nipple discharge.. Breast tissue WNL, no masses, lumps, or tenderness.. Gastrointestinal (GI) Abdomen without masses or tenderness.. No liver or spleen enlargement or tenderness.. Lymphatic No adneopathy. No adenopathy. No adenopathy. Musculoskeletal Adexa without tenderness or enlargement.. Digits and nails w/o clubbing, cyanosis, infection, petechiae, ischemia, or inflammatory conditions.Marland Kitchen Psychiatric Judgement and insight Intact.. No evidence of depression, anxiety, or agitation.. 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 ([id number]) excoriation and ulceration left worse than right. No debridement was required today. Integumentary (Hair, Skin) No suspicious lesions. No crepitus or fluctuance. No peri-wound warmth or erythema. No masses.. Wound #1 status is Open. Original cause of wound was Gradually Appeared. The wound is located on the Right,Circumferential Lower Leg. The wound measures 17cm length x 26cm width x 0.1cm depth;  347.146cm^2 area and 34.715cm^3 volume. The wound is limited to skin breakdown. There is no tunneling or undermining noted. There is a large amount of serous drainage noted. The wound margin is flat and intact. There is large (67-100%) red granulation within the wound bed. There is a small (1-33%) amount of necrotic tissue within  the wound bed including Adherent Slough. The periwound skin appearance exhibited: Localized Edema, Maceration, Moist, Erythema. The surrounding wound skin color is noted with erythema which is circumferential. Periwound temperature was noted as No Abnormality. The periwound has tenderness on palpation. Wound #2 status is Open. Original cause of wound was Gradually Appeared. The wound is located on the Right,Circumferential Lower Leg. The wound measures 19cm length x 30.6cm width x 0.1cm depth; 456.63cm^2 area and 45.663cm^3 volume. The wound is limited to skin breakdown. There is no tunneling or undermining noted. There is a large amount of serous drainage noted. The wound margin is flat and intact. There is large (67-100%) red, pink granulation within the wound bed. There is a small (1-33%) amount of necrotic tissue within the wound bed including Adherent Slough. The periwound skin appearance exhibited: Localized Edema, Erythema. The surrounding wound skin color is noted with erythema which is circumferential. Periwound temperature was noted as No Abnormality. The periwound has tenderness on palpation. Wound #3 status is Open. Original cause of wound was Gradually Appeared. The wound is located on the Left,Circumferential Foot. The wound measures 9cm length x 27cm width x 0.1cm depth; 190.852cm^2 area and 19.085cm^3 volume. The wound is limited to skin breakdown. There is no tunneling or undermining noted. There is a large amount of serous drainage noted. The wound margin is flat and intact. There is large (67-100%) red, pink granulation within the wound bed. There is no necrotic tissue within the wound bed. The periwound skin appearance exhibited: Localized Edema, Maceration, Moist, Erythema. The surrounding wound skin color is noted with erythema which is circumferential. Periwound temperature was noted as No Abnormality. The periwound has tenderness on palpation. Wound #4 status is Open.  Original cause of wound was Gradually Appeared. The wound is located on the Right,Circumferential Foot. The wound measures 10cm length x 28cm width x 0.1cm depth; 219.911cm^2 area and 21.991cm^3 volume. The wound is limited to skin breakdown. There is no tunneling or undermining noted. There is a large amount of serous drainage noted. The wound margin is flat and intact. There is large (67-100%) red, pink granulation within the wound bed. There is no necrotic tissue within the wound bed. The periwound skin appearance exhibited: Localized Edema, Maceration, Moist, Erythema. The surrounding wound skin color is noted with erythema which is circumferential. Periwound temperature was noted as No Abnormality. The periwound has tenderness on palpation. General Notes: 2 sutures in place Assessment [name], [name] J. 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 He is awaiting vascular studies for both arterial and venous duplex studies. I have recommended: 1. Elevation and exercise 2. 3 layer Profore compression wraps to be changed 3 times a week as he has a lot of drainage 3. Spent about 3 minutes discussing with him the need to completely give up smoking and he says he'll  be compliant. 4. may wash his legs with soap and water prior to compression wraps been applied. Plan Wound Cleansing: Wound #1 Right,Circumferential Lower Leg: Cleanse wound with mild soap and water - when changing the wrap Wound #2 Right,Circumferential Lower Leg: Cleanse wound with mild soap and  water - when changing the wrap Wound #3 Left,Circumferential Foot: Cleanse wound with mild soap and water - when changing the wrap Wound #4 Right,Circumferential Foot: Cleanse wound with mild soap and water - when changing the wrap Anesthetic: Wound #3 Left,Circumferential Foot: Topical Lidocaine 4% cream applied to wound bed prior to debridement - for office use only Skin Barriers/Peri-Wound Care: Wound #1 Right,Circumferential Lower Leg: Barrier cream Wound #2 Right,Circumferential Lower Leg: Barrier cream Wound #3 Left,Circumferential Foot: Barrier cream Wound #4 Right,Circumferential Foot: Barrier cream Primary Wound Dressing: Wound #1 Right,Circumferential Lower Leg: Romick, Moris J. ([id number]) Aquacel Ag Wound #2 Right,Circumferential Lower Leg: Aquacel Ag Wound #3 Left,Circumferential Foot: Aquacel Ag Wound #4 Right,Circumferential Foot: Aquacel Ag Secondary Dressing: Wound #1 Right,Circumferential Lower Leg: ABD pad XtraSorb Wound #2 Right,Circumferential Lower Leg: ABD pad XtraSorb Wound #3 Left,Circumferential Foot: ABD pad XtraSorb Wound #4 Right,Circumferential Foot: ABD pad XtraSorb Dressing Change Frequency: Wound #1 Right,Circumferential Lower Leg: Three times weekly - Home Health to change wraps Monday and Wednesday Pt is seen in clinic on Friday and is changed then Wound #2 Right,Circumferential Lower Leg: Three times weekly - Home Health to change wraps Monday and Wednesday Pt is seen in clinic on Friday and is changed then Wound #3 Left,Circumferential Foot: Three times weekly - Home Health to change wraps Monday and Wednesday Pt is seen in clinic on Friday and is changed then Wound #4 Right,Circumferential Foot: Three times weekly - Home Health to change wraps Monday and Wednesday Pt is seen in clinic on Friday and is changed then Follow-up Appointments: Wound #1 Right,Circumferential Lower Leg: Return Appointment in 1 week. Wound #2  Right,Circumferential Lower Leg: Return Appointment in 1 week. Wound #3 Left,Circumferential Foot: Return Appointment in 1 week. Wound #4 Right,Circumferential Foot: Return Appointment in 1 week. Edema Control: Wound #1 Right,Circumferential Lower Leg: 3 Layer Compression System - Bilateral - Home Health to change Monday and Wednesday Elevate legs to the level of the heart and pump ankles as often as possible Wound #2 Right,Circumferential Lower Leg: 3 Layer Compression System - Bilateral - Home Health to change Monday and Wednesday Elevate legs to the level of the heart and pump ankles as often as possible Wound #3 Left,Circumferential Foot: 3 Layer Compression System - Bilateral - Home Health to change Monday and Wednesday CHIEF, WALKUP. ([id number]) Elevate legs to the level of the heart and pump ankles as often as possible Wound #4 Right,Circumferential Foot: 3 Layer Compression System - Bilateral - Home Health to change Monday and Wednesday Elevate legs to the level of the heart and pump ankles as often as possible Additional Orders / Instructions: Wound #1 Right,Circumferential Lower Leg: Stop Smoking Increase protein intake. Wound #2 Right,Circumferential Lower Leg: Stop Smoking Increase protein intake. Wound #3 Left,Circumferential Foot: Stop Smoking Increase protein intake. Wound #4 Right,Circumferential Foot: Stop Smoking Increase protein intake. Home Health: Wound #1 Right,Circumferential Lower Leg: Initiate Home Health for Skilled Nursing Home Health Nurse may visit PRN to address patient s wound care needs. FACE TO FACE ENCOUNTER: MEDICARE and MEDICAID PATIENTS: I certify that this patient is under my care and that I had a face-to-face encounter that meets the physician face-to-face encounter requirements with this patient on this  date. The encounter with the patient was in whole or in part for the following MEDICAL CONDITION: (primary reason for Home  Healthcare) MEDICAL NECESSITY: I certify, that based on my findings, NURSING services are a medically necessary home health service. HOME BOUND STATUS: I certify that my clinical findings support that this patient is homebound (i.e., Due to illness or injury, pt requires aid of supportive devices such as crutches, cane, wheelchairs, walkers, the use of special transportation or the assistance of another person to leave their place of residence. There is a normal inability to leave the home and doing so requires considerable and taxing effort. Other absences are for medical reasons / religious services and are infrequent or of short duration when for other reasons). If current dressing causes regression in wound condition, may D/C ordered dressing product/s and apply Normal Saline Moist Dressing daily until next Wound Healing Center / Other MD appointment. Notify Wound Healing Center of regression in wound condition at [phone]. Please direct any NON-WOUND related issues/requests for orders to patient's Primary Care Physician Wound #2 Right,Circumferential Lower Leg: Initiate Home Health for Skilled Nursing Home Health Nurse may visit PRN to address patient s wound care needs. FACE TO FACE ENCOUNTER: MEDICARE and MEDICAID PATIENTS: I certify that this patient is under my care and that I had a face-to-face encounter that meets the physician face-to-face encounter requirements with this patient on this date. The encounter with the patient was in whole or in part for the following MEDICAL CONDITION: (primary reason for Home Healthcare) MEDICAL NECESSITY: I certify, that based on my findings, NURSING services are a medically necessary home health service. HOME BOUND STATUS: I certify that my clinical findings support that this patient is homebound (i.e., Due to illness or injury, pt requires aid of supportive devices such as crutches, cane, wheelchairs, walkers, the use of special transportation  or the assistance of another person to leave their place of residence. There is a normal inability to leave the home and doing so requires considerable and taxing effort. Other absences are for medical reasons / religious services and are infrequent or of short duration when for other reasons). If current dressing causes regression in wound condition, may D/C ordered dressing product/s and apply Normal Saline Moist Dressing daily until next Wound Healing Center / Other MD appointment. Notify Wound Healing Center of regression in wound condition at [phone]. DEMETRICE, COMBES ([id number]) Please direct any NON-WOUND related issues/requests for orders to patient's Primary Care Physician Wound #3 Left,Circumferential Foot: Initiate Home Health for Skilled Nursing Home Health Nurse may visit PRN to address patient s wound care needs. FACE TO FACE ENCOUNTER: MEDICARE and MEDICAID PATIENTS: I certify that this patient is under my care and that I had a face-to-face encounter that meets the physician face-to-face encounter requirements with this patient on this date. The encounter with the patient was in whole or in part for the following MEDICAL CONDITION: (primary reason for Home Healthcare) MEDICAL NECESSITY: I certify, that based on my findings, NURSING services are a medically necessary home health service. HOME BOUND STATUS: I certify that my clinical findings support that this patient is homebound (i.e., Due to illness or injury, pt requires aid of supportive devices such as crutches, cane, wheelchairs, walkers, the use of special transportation or the assistance of another person to leave their place of residence. There is a normal inability to leave the home and doing so requires considerable and taxing effort. Other absences are for medical reasons / religious  services and are infrequent or of short duration when for other reasons). If current dressing causes regression in wound  condition, may D/C ordered dressing product/s and apply Normal Saline Moist Dressing daily until next Wound Healing Center / Other MD appointment. Notify Wound Healing Center of regression in wound condition at [phone]. Please direct any NON-WOUND related issues/requests for orders to patient's Primary Care Physician Wound #4 Right,Circumferential Foot: Initiate Home Health for Skilled Nursing Home Health Nurse may visit PRN to address patient s wound care needs. FACE TO FACE ENCOUNTER: MEDICARE and MEDICAID PATIENTS: I certify that this patient is under my care and that I had a face-to-face encounter that meets the physician face-to-face encounter requirements with this patient on this date. The encounter with the patient was in whole or in part for the following MEDICAL CONDITION: (primary reason for Home Healthcare) MEDICAL NECESSITY: I certify, that based on my findings, NURSING services are a medically necessary home health service. HOME BOUND STATUS: I certify that my clinical findings support that this patient is homebound (i.e., Due to illness or injury, pt requires aid of supportive devices such as crutches, cane, wheelchairs, walkers, the use of special transportation or the assistance of another person to leave their place of residence. There is a normal inability to leave the home and doing so requires considerable and taxing effort. Other absences are for medical reasons / religious services and are infrequent or of short duration when for other reasons). If current dressing causes regression in wound condition, may D/C ordered dressing product/s and apply Normal Saline Moist Dressing daily until next Wound Healing Center / Other MD appointment. Notify Wound Healing Center of regression in wound condition at [phone]. Please direct any NON-WOUND related issues/requests for orders to patient's Primary Care Physician this 55 year old gentleman who is morbidly obese and has  bilateral lower extremity lymphedema has no definite history of CHF or renal disease. He is awaiting vascular studies for both arterial and venous duplex studies. I have recommended: 1. Elevation and exercise 2. 3 layer Profore compression wraps to be changed 3 times a week as he has a lot of drainage Sanjose, Kelwin J. ([id number]) 3. Spent about 3 minutes discussing with him the need to completely give up smoking and he says he'll be compliant. 4. may wash his legs with soap and water prior to compression wraps been applied. 5. he will be seen back in the wound clinic on a regular basis Electronic Signature(s) Signed: 04/30/2016 3:43:21 PM By: Evlyn Kanner MD, FACS Previous Signature: 04/23/2016 4:01:11 PM Version By: Evlyn Kanner MD, FACS Previous Signature: 04/23/2016 12:01:03 PM Version By: Evlyn Kanner MD, FACS Entered By: Evlyn Kanner on 04/30/2016 15:43:21 Ciullo, [name] ([id number]) -------------------------------------------------------------------------------- ROS/PFSH Details Patient Name: [name] Date of Service: 04/23/2016 9:30 AM Medical Record Number: [id number] Patient Account Number: [id number] Date of Birth/Sex: [birth year] (55 y.o. Male) Treating RN: Phillis Haggis Primary Care Physician: Corky Downs Other Clinician: Referring Physician: Corky Downs Treating Physician/Extender: Rudene Re in Treatment: 0 Information Obtained From Patient Other: Wound History Do you currently have one or more open woundso Yes How many open wounds do you currently haveo 4 Approximately how long have you had your woundso 4 months How have you been treating your wound(s) until nowo silver pads and wraps Has your wound(s) ever healed and then re-openedo Yes Have you had any lab work done in the past montho Yes Who ordered the lab work doneo [facility] Have you tested positive for an  antibiotic resistant organism (MRSA, VRE)o No Have you tested positive for  osteomyelitis (bone infection)o No Have you had any tests for circulation on your legso No Have you had other problems associated with your woundso Swelling Integumentary (Skin) Complaints and Symptoms: Positive for: Wounds Constitutional Symptoms (General Health) Complaints and Symptoms: No Complaints or Symptoms Eyes Complaints and Symptoms: No Complaints or Symptoms Ear/Nose/Mouth/Throat Complaints and Symptoms: No Complaints or Symptoms Hematologic/Lymphatic Complaints and Symptoms: No Complaints or Symptoms Respiratory Tarlton, Mac J. ([id number]) Complaints and Symptoms: No Complaints or Symptoms Cardiovascular Medical History: Positive for: Arrhythmia - A-FIB; Hypertension Gastrointestinal Complaints and Symptoms: Review of System Notes: anal fissure colon polyps Endocrine Complaints and Symptoms: No Complaints or Symptoms Genitourinary Complaints and Symptoms: No Complaints or Symptoms Immunological Complaints and Symptoms: No Complaints or Symptoms Musculoskeletal Complaints and Symptoms: No Complaints or Symptoms Neurologic Complaints and Symptoms: No Complaints or Symptoms Oncologic Complaints and Symptoms: No Complaints or Symptoms Psychiatric Complaints and Symptoms: No Complaints or Symptoms Family and Social History OCTAVE, MONTROSE ([id number]) Cancer: Yes - Maternal Grandparents; Diabetes: Yes - Siblings; Heart Disease: No; Hereditary Spherocytosis: No; Hypertension: Yes - Siblings; Kidney Disease: No; Lung Disease: No; Seizures: No; Stroke: No; Thyroid Problems: No; Tuberculosis: No; Former smoker - less than a pack a day; Marital Status - Single; Alcohol Use: Moderate; Drug Use: No History; Caffeine Use: Daily; Financial Concerns: No; Food, Clothing or Shelter Needs: No; Support System Lacking: No; Transportation Concerns: No; Advanced Directives: No; Patient does not want information on Advanced Directives; Do not resuscitate: No;  Living Will: No; Medical Power of Attorney: No Physician Affirmation I have reviewed and agree with the above information. Electronic Signature(s) Signed: 04/23/2016 11:01:50 AM By: Evlyn Kanner MD, FACS Signed: 04/23/2016 5:12:54 PM By: Alejandro Mulling Entered By: Evlyn Kanner on 04/23/2016 11:01:50 Colello, [name] ([id number]) -------------------------------------------------------------------------------- SuperBill Details Patient Name: [name] Date of Service: 04/23/2016 Medical Record Number: [id number] Patient Account Number: [id number] Date of Birth/Sex: 04-Jan-1961 (55 y.o. 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 I87.032 Postthrombotic syndrome with ulcer and inflammation of left lower extremity F17.218 Nicotine dependence, cigarettes, with other nicotine-induced disorders Facility Procedures CPT4: Description Modifier Quantity Code [id number] - WOUND CARE VISIT-LEV 5 EST PT 1 CPT4: 32440102 [id number]-SMOKING CESSATION 3-10MINS 1 ICD-10 Description Diagnosis I89.0 Lymphedema, not elsewhere classified 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

## 2016-04-24 NOTE — Progress Notes (Signed)
[name], Zeyad J. ([id number]) Visit Report for 04/23/2016 Abuse/Suicide Risk Screen Details Patient Name: [name], [name] J. Date of Service: 04/23/2016 9:30 AM Medical Record Number: [id number] Patient Account Number: [id number] Date of Birth/Sex: 23-Mar-1961 20(54 y.o. Male) Treating RN: Phillis HaggisPinkerton, Debi Primary Care Physician: Corky DownsMASOUD, [name] Other Clinician: Referring Physician: Treating Physician/Extender: Rudene ReBritto, Errol Weeks in Treatment: 0 Abuse/Suicide Risk Screen Items Answer ABUSE/SUICIDE RISK SCREEN: Has anyone close to you tried to hurt or harm you recentlyo No Do you feel uncomfortable with anyone in your familyo No Has anyone forced you do things that you didnot want to doo No Do you have any thoughts of harming yourselfo No Patient displays signs or symptoms of abuse and/or neglect. No Electronic Signature(s) Signed: 04/23/2016 5:12:54 PM By: Alejandro MullingPinkerton, Debra Entered By: Alejandro MullingPinkerton, Debra on 04/23/2016 10:25:53 Rufo, [name] ([id number]) -------------------------------------------------------------------------------- Activities of Daily Living Details Patient Name: [name], [name] J. Date of Service: 04/23/2016 9:30 AM Medical Record Number: [id number] Patient Account Number: [id number] Date of Birth/Sex: 23-Mar-1961 [age] y.o. Male) Treating RN: Phillis HaggisPinkerton, Debi Primary Care Physician: Corky DownsMASOUD, [name] Other Clinician: Referring Physician: Treating Physician/Extender: Rudene ReBritto, Errol Weeks in Treatment: 0 Activities of Daily Living Items Answer Activities of Daily Living (Please select one for each item) Drive Automobile Completely Able Take Medications Completely Able Use Telephone Completely Able Care for Appearance Completely Able Use Toilet Completely Able Bath / Shower Completely Able Dress Self Completely Able Feed Self Completely Able Walk Completely Able Get In / Out Bed Completely Able Housework Completely Able Prepare Meals Completely Able Handle Money  Completely Able Shop for Self Completely Able Electronic Signature(s) Signed: 04/23/2016 5:12:54 PM By: Alejandro MullingPinkerton, Debra Entered By: Alejandro MullingPinkerton, Debra on 04/23/2016 10:26:14 Lundy, [name] ([id number]) -------------------------------------------------------------------------------- Education Assessment Details Patient Name: [name], [name] J. Date of Service: 04/23/2016 9:30 AM Medical Record Number: [id number] Patient Account Number: [id number] Date of Birth/Sex: 23-Mar-1961 [age] y.o. Male) Treating RN: Phillis HaggisPinkerton, Debi Primary Care Physician: Corky DownsMASOUD, [name] Other Clinician: Referring Physician: Treating Physician/Extender: Rudene ReBritto, Errol Weeks in Treatment: 0 Primary Learner Assessed: Patient Learning Preferences/Education Level/Primary Language Learning Preference: Explanation, Printed Material Highest Education Level: High School Preferred Language: English Cognitive Barrier Assessment/Beliefs Language Barrier: No Translator Needed: No Memory Deficit: No Emotional Barrier: No Cultural/Religious Beliefs Affecting Medical No Care: Physical Barrier Assessment Impaired Vision: No Impaired Hearing: No Decreased Hand dexterity: No Knowledge/Comprehension Assessment Knowledge Level: High Comprehension Level: High Ability to understand written High instructions: Ability to understand verbal High instructions: Motivation Assessment Anxiety Level: Calm Cooperation: Cooperative Education Importance: Acknowledges Need Interest in Health Problems: Asks Questions Perception: Coherent Willingness to Engage in Self- High Management Activities: Readiness to Engage in Self- High Management Activities: Electronic Signature(s) [name], [name] J. ([id number]) Signed: 04/23/2016 5:12:54 PM By: Alejandro MullingPinkerton, Debra Entered By: Alejandro MullingPinkerton, Debra on 04/23/2016 10:26:44 Zingaro, [name] ([id number]) -------------------------------------------------------------------------------- Fall  Risk Assessment Details Patient Name: [name], [name] J. Date of Service: 04/23/2016 9:30 AM Medical Record Number: [id number] Patient Account Number: [id number] Date of Birth/Sex: 23-Mar-1961 [age] y.o. Male) Treating RN: Phillis HaggisPinkerton, Debi Primary Care Physician: Corky DownsMASOUD, [name] Other Clinician: Referring Physician: Treating Physician/Extender: Rudene ReBritto, Errol Weeks in Treatment: 0 Fall Risk Assessment Items Have you had 2 or more falls in the last 12 monthso 0 No Have you had any fall that resulted in injury in the last 12 monthso 0 No FALL RISK ASSESSMENT: History of falling - immediate or within 3 months 0 No Secondary diagnosis 15 Yes Ambulatory aid None/bed rest/wheelchair/nurse 0 No Crutches/cane/walker 0 No Furniture 0 No IV Access/Saline Lock 0 No Gait/Training  Normal/bed rest/immobile 0 No Weak 0 No Impaired 0 No Mental Status Oriented to own ability 0 Yes Electronic Signature(s) Signed: 04/23/2016 5:12:54 PM By: Alejandro Mulling Entered By: Alejandro Mulling on 04/23/2016 10:27:29 Treinen, [name] ([id number]) -------------------------------------------------------------------------------- Foot Assessment Details Patient Name: [name] Date of Service: 04/23/2016 9:30 AM Medical Record Number: [id number] Patient Account Number: [id number] Date of Birth/Sex: 27-Dec-1960 (55 y.o. Male) Treating RN: Phillis Haggis Primary Care Physician: Corky Downs Other Clinician: Referring Physician: Treating Physician/Extender: Rudene Re in Treatment: 0 Foot Assessment Items Site Locations + = Sensation present, - = Sensation absent, C = Callus, U = Ulcer R = Redness, W = Warmth, M = Maceration, PU = Pre-ulcerative lesion F = Fissure, S = Swelling, D = Dryness Assessment Right: Left: Other Deformity: No No Prior Foot Ulcer: No No Prior Amputation: No No Charcot Joint: No No Ambulatory Status: Ambulatory Without Help Gait: Steady Electronic  Signature(s) Signed: 04/23/2016 5:12:54 PM By: Alejandro Mulling Entered By: Alejandro Mulling on 04/23/2016 10:31:04 Paulino, [name] ([id number]) -------------------------------------------------------------------------------- Nutrition Risk Assessment Details Patient Name: [name] Date of Service: 04/23/2016 9:30 AM Medical Record Number: [id number] Patient Account Number: [id number] Date of Birth/Sex: [birth year] (55 y.o. Male) Treating RN: Phillis Haggis Primary Care Physician: Corky Downs Other Clinician: Referring Physician: Treating Physician/Extender: Rudene Re in Treatment: 0 Height (in): 76 Weight (lbs): 320 Body Mass Index (BMI): 38.9 Nutrition Risk Assessment Items NUTRITION RISK SCREEN: I have an illness or condition that made me change the kind and/or 2 Yes amount of food I eat I eat fewer than two meals per day 0 No I eat few fruits and vegetables, or milk products 0 No I have three or more drinks of beer, liquor or wine almost every day 0 No I have tooth or mouth problems that make it hard for me to eat 0 No I don't always have enough money to buy the food I need 0 No I eat alone most of the time 0 No I take three or more different prescribed or over-the-counter drugs a 1 Yes day Without wanting to, I have lost or gained 10 pounds in the last six 0 No months I am not always physically able to shop, cook and/or feed myself 0 No Nutrition Protocols Good Risk Protocol Moderate Risk Protocol Electronic Signature(s) Signed: 04/23/2016 5:12:54 PM By: Alejandro Mulling Entered By: Alejandro Mulling on 04/23/2016 10:28:00

## 2016-04-28 ENCOUNTER — Ambulatory Visit: Payer: BC Managed Care – PPO | Admitting: General Surgery

## 2016-04-30 ENCOUNTER — Encounter: Payer: BC Managed Care – PPO | Admitting: Surgery

## 2016-04-30 DIAGNOSIS — I890 Lymphedema, not elsewhere classified: Secondary | ICD-10-CM | POA: Diagnosis not present

## 2016-04-30 NOTE — Progress Notes (Addendum)
KAIDE, GAGE ([id number]) Visit Report for 04/30/2016 Chief Complaint Document Details Patient Name: [name], [name] Date of Service: 04/30/2016 12:45 PM Medical Record Number: [id number] Patient Account Number: [id number] Date of Birth/Sex: [birth year] (55 y.o. Male) Treating RN: Phillis Haggis Primary Care Physician: Corky Downs Other Clinician: Referring Physician: Corky Downs Treating Physician/Extender: Rudene Re in Treatment: 1 Information Obtained from: Patient Chief Complaint Patient presents to the wound care center for a consult due non healing wound to both lower extremities and accompanied by swelling and this has been worse for the last 5 months. He says the swelling of both lower extremity has been there for at least 6-7 years. Electronic Signature(s) Signed: 04/30/2016 1:34:01 PM By: Evlyn Kanner MD, FACS Entered By: Evlyn Kanner on 04/30/2016 13:34:01 [name], [name] ([id number]) -------------------------------------------------------------------------------- HPI Details Patient Name: [name] Date of Service: 04/30/2016 12:45 PM Medical Record Number: [id number] Patient Account Number: [id number] Date of Birth/Sex: 1961-04-07 (55 y.o. Male) Treating RN: Phillis Haggis Primary Care Physician: Corky Downs Other Clinician: Referring Physician: Corky Downs Treating Physician/Extender: Rudene Re in Treatment: 1 History of Present Illness Location: ulceration and weeping of both lower extremities left more than the right Quality: Patient reports experiencing heaviness to affected area(s). Severity: Patient states wound are getting worse. Duration: Patient has had the wound for > 5 months prior to seeking treatment at the wound center Timing: Pain in wound is Intermittent (comes and goes Context: The wound would happen gradually Modifying Factors: Other treatment(s) tried include:has been having weekly wraps applied to  both lower extremities at Dr. Zannie Kehr office Associated Signs and Symptoms: Patient reports having increase swelling. HPI Description: 55 year old gentleman who has been treated in the past for venous ulcers of the lower extremity is also known to be a smoker for the last 20 years and smokes about a pack of cigarettes a day. He has been seen by Dr. Evette Cristal who has been treating left lower extremity venous ulcer with an Unna boot and have consulted vascular surgery to be seen by Dr. Gilda Crease. the patient was also recently admitted to the hospital on 04/17/2016 and discharged on 04/19/2016 with bleeding, hyponatremia, cellulitis of the right leg, laceration of the right foot. 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 He was admitted to hospital with significant bleeding from the ulcer and initially started on IV antibiotics for underlying cellulitis which was suspected. Patient was seen by Dr. Evie Lacks of surgery and Dr Alberteen Spindle of podiatry. Dr. Graciela Husbands had done excisional debridement of some of the superficial skin slough as well as the ulcerative area on the posterior aspect of the left ankle region. Unna's boots was applied. He was asked to follow-up at the wound center. He is on Xarelto for his chronic atrial fibrillation. 04/30/2016 -- the patient was seen by the PA at the vein and vascular practice who did a consultation but did not have any investigations ordered or done. The patient continues to have a lot of oozing from his wounds. Electronic Signature(s) Signed: 04/30/2016 1:34:06 PM By: Evlyn Kanner MD, FACS Entered By: Evlyn Kanner on 04/30/2016 13:34:06 [name], [name] ([id number]) -------------------------------------------------------------------------------- Physical Exam Details Patient Name: [name] Date of Service: 04/30/2016 12:45 PM Medical Record Number: [id number] Patient  Account Number: [id number] Date of Birth/Sex: Feb 11, 1961 (55 y.o. Male) Treating RN: Phillis Haggis Primary Care Physician: Corky Downs Other Clinician: Referring Physician: Corky Downs Treating Physician/Extender: Rudene Re in Treatment: 1  Constitutional . Pulse regular. Respirations normal and unlabored. Afebrile. . Eyes Nonicteric. Reactive to light. Ears, Nose, Mouth, and Throat Lips, teeth, and gums WNL.Marland Kitchen Moist mucosa without lesions. Neck supple and nontender. No palpable supraclavicular or cervical adenopathy. Normal sized without goiter. Respiratory WNL. No retractions.. Cardiovascular Pedal Pulses WNL. No clubbing, cyanosis or edema. Lymphatic No adneopathy. No adenopathy. No adenopathy. Musculoskeletal Adexa without tenderness or enlargement.. Digits and nails w/o clubbing, cyanosis, infection, petechiae, ischemia, or inflammatory conditions.. Integumentary (Hair, Skin) No suspicious lesions. No crepitus or fluctuance. No peri-wound warmth or erythema. No masses.Marland Kitchen Psychiatric Judgement and insight Intact.. No evidence of depression, anxiety, or agitation.. Notes the patient continues to have a left worse than right lymphedema and has got a lot of excoriation and ulceration the left again being worse than the right. No debridement was possible today as he is rather tender. Electronic Signature(s) Signed: 04/30/2016 1:34:58 PM By: Evlyn Kanner MD, FACS Entered By: Evlyn Kanner on 04/30/2016 13:34:57 [name], [name] ([id number]) -------------------------------------------------------------------------------- Physician Orders Details Patient Name: [name] Date of Service: 04/30/2016 12:45 PM Medical Record Number: [id number] Patient Account Number: [id number] Date of Birth/Sex: 28-Jun-1961 (55 y.o. 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 I87.032 Postthrombotic syndrome with ulcer and inflammation of left lower extremity F17.218 Nicotine dependence, cigarettes, with other nicotine-induced disorders Wound Cleansing Wound #1 Right,Circumferential Lower Leg o Cleanse wound with mild soap and water - when changing the wrap Wound #2 Right,Circumferential Lower Leg o Cleanse wound with mild soap and water - when changing the wrap Wound #3 Left,Circumferential Foot o Cleanse wound with mild soap and water - when changing the wrap Wound #4 Right,Circumferential Foot o Cleanse wound with mild soap and water - when changing the wrap Anesthetic Wound #3 Left,Circumferential Foot o Topical Lidocaine 4% cream applied to wound bed prior to debridement - for office use only Skin Barriers/Peri-Wound Care Wound #1 Right,Circumferential Lower Leg o Barrier cream Wound #2 Right,Circumferential Lower Leg o Barrier cream Wound #3 Left,Circumferential Foot o Barrier cream Wound #4 Right,Circumferential Foot o Barrier cream [name], [name] J. ([id number]) Primary Wound Dressing Wound #1 Right,Circumferential Lower Leg o Aquacel Ag Wound #2 Right,Circumferential Lower Leg o Aquacel Ag Wound #3 Left,Circumferential Foot o Aquacel Ag Wound #4 Right,Circumferential Foot o Aquacel Ag Secondary Dressing Wound #1 Right,Circumferential Lower Leg o ABD pad o XtraSorb Wound #2 Right,Circumferential Lower Leg o ABD pad o XtraSorb Wound #3 Left,Circumferential Foot o ABD pad o XtraSorb Wound #4 Right,Circumferential Foot o ABD pad o XtraSorb Dressing Change Frequency Wound #1  Right,Circumferential Lower Leg o Three times weekly - Home Health to change wraps Monday and Wednesday Pt is seen in clinic on Friday and is changed then Wound #2 Right,Circumferential Lower Leg o Three times weekly - Home Health to change wraps Monday and Wednesday Pt is seen in clinic on Friday and is changed then Wound #3 Left,Circumferential Foot o Three times weekly - Home Health to change wraps Monday and Wednesday Pt is seen in clinic on Friday and is changed then Wound #4 Right,Circumferential Foot o Three times weekly - Home Health to change wraps Monday and Wednesday Pt is seen in clinic on Friday and is changed  then [name] RouteDAMERON, Tavarious J. ([id number]) Follow-up Appointments Wound #1 Right,Circumferential Lower Leg o Return Appointment in 1 week. Wound #2 Right,Circumferential Lower Leg o Return Appointment in 1 week. Wound #3 Left,Circumferential Foot o Return Appointment in 1 week. Wound #4 Right,Circumferential Foot o Return Appointment in 1 week. Edema Control Wound #1 Right,Circumferential Lower Leg o 3 Layer Compression System - Bilateral - Home Health to change Monday and Wednesday o Elevate legs to the level of the heart and pump ankles as often as possible Wound #2 Right,Circumferential Lower Leg o 3 Layer Compression System - Bilateral - Home Health to change Monday and Wednesday o Elevate legs to the level of the heart and pump ankles as often as possible Wound #3 Left,Circumferential Foot o 3 Layer Compression System - Bilateral - Home Health to change Monday and Wednesday o Elevate legs to the level of the heart and pump ankles as often as possible Wound #4 Right,Circumferential Foot o 3 Layer Compression System - Bilateral - Home Health to change Monday and Wednesday o Elevate legs to the level of the heart and pump ankles as often as possible Additional Orders / Instructions Wound #1 Right,Circumferential Lower Leg o Stop  Smoking o Increase protein intake. Wound #2 Right,Circumferential Lower Leg o Stop Smoking o Increase protein intake. Wound #3 Left,Circumferential Foot o Stop Smoking o Increase protein intake. Wound #4 Right,Circumferential Foot o Stop Smoking o Increase protein intake. Home Health Fetch, [name] ([id number]) Wound #1 Right,Circumferential Lower Leg o Initiate Home Health for Skilled Nursing o Home Health Nurse may visit PRN to address patientos wound care needs. o FACE TO FACE ENCOUNTER: MEDICARE and MEDICAID PATIENTS: I certify that this patient is under my care and that I had a face-to-face encounter that meets the physician face-to-face encounter requirements with this patient on this date. The encounter with the patient was in whole or in part for the following MEDICAL CONDITION: (primary reason for Home Healthcare) MEDICAL NECESSITY: I certify, that based on my findings, NURSING services are a medically necessary home health service. HOME BOUND STATUS: I certify that my clinical findings support that this patient is homebound (i.e., Due to illness or injury, pt requires aid of supportive devices such as crutches, cane, wheelchairs, walkers, the use of special transportation or the assistance of another person to leave their place of residence. There is a normal inability to leave the home and doing so requires considerable and taxing effort. Other absences are for medical reasons / religious services and are infrequent or of short duration when for other reasons). o If current dressing causes regression in wound condition, may D/C ordered dressing product/s and apply Normal Saline Moist Dressing daily until next Wound Healing Center / Other MD appointment. Notify Wound Healing Center of regression in wound condition at [phone]. o Please direct any NON-WOUND related issues/requests for orders to patient's Primary Care Physician Wound #2  Right,Circumferential Lower Leg o Initiate Home Health for Skilled Nursing o Home Health Nurse may visit PRN to address patientos wound care needs. o FACE TO FACE ENCOUNTER: MEDICARE and MEDICAID PATIENTS: I certify that this patient is under my care and that I had a face-to-face encounter that meets the physician face-to-face encounter requirements with this patient on this date. The encounter with the patient was in whole or in part for the following MEDICAL CONDITION: (primary reason for Home Healthcare) MEDICAL NECESSITY: I certify, that based on my findings, NURSING services are a medically necessary home health service. HOME BOUND STATUS:  I certify that my clinical findings support that this patient is homebound (i.e., Due to illness or injury, pt requires aid of supportive devices such as crutches, cane, wheelchairs, walkers, the use of special transportation or the assistance of another person to leave their place of residence. There is a normal inability to leave the home and doing so requires considerable and taxing effort. Other absences are for medical reasons / religious services and are infrequent or of short duration when for other reasons). o If current dressing causes regression in wound condition, may D/C ordered dressing product/s and apply Normal Saline Moist Dressing daily until next Wound Healing Center / Other MD appointment. Notify Wound Healing Center of regression in wound condition at [phone]. o Please direct any NON-WOUND related issues/requests for orders to patient's Primary Care Physician Wound #3 Left,Circumferential Foot o Initiate Home Health for Skilled Nursing o Home Health Nurse may visit PRN to address patientos wound care needs. o FACE TO FACE ENCOUNTER: MEDICARE and MEDICAID PATIENTS: I certify that this patient is under my care and that I had a face-to-face encounter that meets the physician face-to-face encounter requirements  with this patient on this date. The encounter with the patient was in whole or in part for the following MEDICAL CONDITION: (primary reason for Home Healthcare) MEDICAL NECESSITY: I certify, that based on my findings, NURSING services are a medically [name], [name] FUNES. ([id number]) necessary home health service. HOME BOUND STATUS: I certify that my clinical findings support that this patient is homebound (i.e., Due to illness or injury, pt requires aid of supportive devices such as crutches, cane, wheelchairs, walkers, the use of special transportation or the assistance of another person to leave their place of residence. There is a normal inability to leave the home and doing so requires considerable and taxing effort. Other absences are for medical reasons / religious services and are infrequent or of short duration when for other reasons). o If current dressing causes regression in wound condition, may D/C ordered dressing product/s and apply Normal Saline Moist Dressing daily until next Wound Healing Center / Other MD appointment. Notify Wound Healing Center of regression in wound condition at [phone]. o Please direct any NON-WOUND related issues/requests for orders to patient's Primary Care Physician Wound #4 Right,Circumferential Foot o Initiate Home Health for Skilled Nursing o Home Health Nurse may visit PRN to address patientos wound care needs. o FACE TO FACE ENCOUNTER: MEDICARE and MEDICAID PATIENTS: I certify that this patient is under my care and that I had a face-to-face encounter that meets the physician face-to-face encounter requirements with this patient on this date. The encounter with the patient was in whole or in part for the following MEDICAL CONDITION: (primary reason for Home Healthcare) MEDICAL NECESSITY: I certify, that based on my findings, NURSING services are a medically necessary home health service. HOME BOUND STATUS: I certify that my clinical  findings support that this patient is homebound (i.e., Due to illness or injury, pt requires aid of supportive devices such as crutches, cane, wheelchairs, walkers, the use of special transportation or the assistance of another person to leave their place of residence. There is a normal inability to leave the home and doing so requires considerable and taxing effort. Other absences are for medical reasons / religious services and are infrequent or of short duration when for other reasons). o If current dressing causes regression in wound condition, may D/C ordered dressing product/s and apply Normal Saline Moist Dressing daily until next Wound  Healing Center / Other MD appointment. Notify Wound Healing Center of regression in wound condition at [phone]. o Please direct any NON-WOUND related issues/requests for orders to patient's Primary Care Physician Electronic Signature(s) Signed: 04/30/2016 3:46:07 PM By: Evlyn Kanner MD, FACS Signed: 04/30/2016 4:07:37 PM By: Alejandro Mulling Entered By: Alejandro Mulling on 04/30/2016 13:59:08 [name], [name] ([id number]) -------------------------------------------------------------------------------- Problem List Details Patient Name: [name] Date of Service: 04/30/2016 12:45 PM Medical Record Number: [id number] Patient Account Number: [id number] Date of Birth/Sex: 1961/02/04 (55 y.o. Male) Treating RN: Phillis Haggis Primary Care Physician: Corky Downs Other Clinician: Referring Physician: Corky Downs Treating Physician/Extender: Rudene Re in Treatment: 1 Active Problems ICD-10 Encounter Code Description Active Date Diagnosis I89.0 Lymphedema, not elsewhere classified 04/23/2016 Yes I87.031 Postthrombotic syndrome with ulcer and inflammation of 04/23/2016 Yes right lower extremity I87.032 Postthrombotic syndrome with ulcer and inflammation of 04/23/2016 Yes left lower extremity F17.218 Nicotine dependence,  cigarettes, with other nicotine- 04/23/2016 Yes induced disorders Inactive Problems Resolved Problems Electronic Signature(s) Signed: 04/30/2016 1:33:55 PM By: Evlyn Kanner MD, FACS Entered By: Evlyn Kanner on 04/30/2016 13:33:55 [name], [name] ([id number]) -------------------------------------------------------------------------------- Progress Note Details Patient Name: [name] Date of Service: 04/30/2016 12:45 PM Medical Record Number: [id number] Patient Account Number: [id number] Date of Birth/Sex: [birth year] (55 y.o. Male) Treating RN: Phillis Haggis Primary Care Physician: Corky Downs Other Clinician: Referring Physician: Corky Downs Treating Physician/Extender: Rudene Re in Treatment: 1 Subjective Chief Complaint Information obtained from Patient Patient presents to the wound care center for a consult due non healing wound to both lower extremities and accompanied by swelling and this has been worse for the last 5 months. He says the swelling of both lower extremity has been there for at least 6-7 years. History of Present Illness (HPI) The following HPI elements were documented for the patient's wound: Location: ulceration and weeping of both lower extremities left more than the right Quality: Patient reports experiencing heaviness to affected area(s). Severity: Patient states wound are getting worse. Duration: Patient has had the wound for > 5 months prior to seeking treatment at the wound center Timing: Pain in wound is Intermittent (comes and goes Context: The wound would happen gradually Modifying Factors: Other treatment(s) tried include:has been having weekly wraps applied to both lower extremities at Dr. Zannie Kehr office Associated Signs and Symptoms: Patient reports having increase swelling. 55 year old gentleman who has been treated in the past for venous ulcers of the lower extremity is also known to be a smoker for the last 20 years  and smokes about a pack of cigarettes a day. He has been seen by Dr. Evette Cristal who has been treating left lower extremity venous ulcer with an Unna boot and have consulted vascular surgery to be seen by Dr. Gilda Crease. the patient was also recently admitted to the hospital on 04/17/2016 and discharged on 04/19/2016 with bleeding, hyponatremia, cellulitis of the right leg, laceration of the right foot. 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 He was admitted to hospital with significant bleeding from the ulcer and initially started on IV antibiotics for underlying cellulitis which was suspected. Patient was seen by Dr. Evie Lacks of surgery and Dr Alberteen Spindle of podiatry. Dr. Graciela Husbands had done excisional debridement of some of the superficial skin slough as well as the ulcerative area on the posterior aspect of the left ankle region. Unna's boots was applied. He was asked to follow-up at the wound center. He is on Xarelto  for his chronic atrial fibrillation. 04/30/2016 -- the patient was seen by the PA at the vein and vascular practice who did a consultation but did not have any investigations ordered or done. The patient continues to have a lot of oozing from his wounds. [name], [name] ([id number]) Objective Constitutional Pulse regular. Respirations normal and unlabored. Afebrile. Vitals Time Taken: 12:54 PM, Height: 76 in, Weight: 320 lbs, BMI: 38.9, Temperature: 97.7 F, Pulse: 65 bpm, Respiratory Rate: 20 breaths/min, Blood Pressure: 106/73 mmHg. Eyes Nonicteric. Reactive to light. Ears, Nose, Mouth, and Throat Lips, teeth, and gums WNL.Marland Kitchen Moist mucosa without lesions. Neck supple and nontender. No palpable supraclavicular or cervical adenopathy. Normal sized without goiter. Respiratory WNL. No retractions.. Cardiovascular Pedal Pulses WNL. No clubbing, cyanosis or edema. Lymphatic No adneopathy. No  adenopathy. No adenopathy. Musculoskeletal Adexa without tenderness or enlargement.. Digits and nails w/o clubbing, cyanosis, infection, petechiae, ischemia, or inflammatory conditions.Marland Kitchen Psychiatric Judgement and insight Intact.. No evidence of depression, anxiety, or agitation.. General Notes: the patient continues to have a left worse than right lymphedema and has got a lot of excoriation and ulceration the left again being worse than the right. No debridement was possible today as he is rather tender. Integumentary (Hair, Skin) No suspicious lesions. No crepitus or fluctuance. No peri-wound warmth or erythema. No masses.. Wound #1 status is Open. Original cause of wound was Gradually Appeared. The wound is located on the Right,Circumferential Lower Leg. The wound measures 17cm length x 26cm width x 0.1cm depth; 347.146cm^2 area and 34.715cm^3 volume. The wound is limited to skin breakdown. There is no tunneling or undermining noted. There is a large amount of serous drainage noted. The wound margin is flat and intact. There is large (67-100%) red granulation within the wound bed. There is a small (1-33%) amount of [name], Zymier J. ([id number]) necrotic tissue within the wound bed including Adherent Slough. The periwound skin appearance exhibited: Localized Edema, Maceration, Moist, Erythema. The surrounding wound skin color is noted with erythema which is circumferential. Periwound temperature was noted as No Abnormality. The periwound has tenderness on palpation. Wound #2 status is Open. Original cause of wound was Gradually Appeared. The wound is located on the Right,Circumferential Lower Leg. The wound measures 19cm length x 30.6cm width x 0.1cm depth; 456.63cm^2 area and 45.663cm^3 volume. The wound is limited to skin breakdown. There is no tunneling or undermining noted. There is a large amount of serous drainage noted. The wound margin is flat and intact. There is large (67-100%)  red, pink granulation within the wound bed. There is a small (1-33%) amount of necrotic tissue within the wound bed including Adherent Slough. The periwound skin appearance exhibited: Localized Edema, Erythema. The surrounding wound skin color is noted with erythema which is circumferential. Periwound temperature was noted as No Abnormality. The periwound has tenderness on palpation. Wound #3 status is Open. Original cause of wound was Gradually Appeared. The wound is located on the Left,Circumferential Foot. The wound measures 9cm length x 27cm width x 0.1cm depth; 190.852cm^2 area and 19.085cm^3 volume. The wound is limited to skin breakdown. There is no tunneling or undermining noted. There is a large amount of serous drainage noted. The wound margin is flat and intact. There is large (67-100%) red, pink granulation within the wound bed. There is no necrotic tissue within the wound bed. The periwound skin appearance exhibited: Localized Edema, Maceration, Moist, Erythema. The surrounding wound skin color is noted with erythema which is circumferential. Periwound temperature was noted as No  Abnormality. The periwound has tenderness on palpation. Wound #4 status is Open. Original cause of wound was Gradually Appeared. The wound is located on the Right,Circumferential Foot. The wound measures 10cm length x 28cm width x 0.1cm depth; 219.911cm^2 area and 21.991cm^3 volume. The wound is limited to skin breakdown. There is no tunneling or undermining noted. There is a large amount of serous drainage noted. The wound margin is flat and intact. There is large (67-100%) red, pink granulation within the wound bed. There is no necrotic tissue within the wound bed. The periwound skin appearance exhibited: Localized Edema, Maceration, Moist, Erythema. The surrounding wound skin color is noted with erythema which is circumferential. Periwound temperature was noted as No Abnormality. The periwound has  tenderness on palpation. 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 ([id number]) Plan Wound Cleansing: Wound #1 Right,Circumferential Lower Leg: Cleanse wound with mild soap and water - when changing the wrap Wound #2 Right,Circumferential Lower Leg: Cleanse wound with mild soap and water - when changing the wrap Wound #3 Left,Circumferential Foot: Cleanse wound with mild soap and water - when changing the wrap Wound #4 Right,Circumferential Foot: Cleanse wound with mild soap and water - when changing the wrap Anesthetic: Wound #3 Left,Circumferential Foot: Topical Lidocaine 4% cream applied to wound bed prior to debridement - for office use only Skin Barriers/Peri-Wound Care: Wound #1 Right,Circumferential Lower Leg: Barrier cream Wound #2 Right,Circumferential Lower Leg: Barrier cream Wound #3 Left,Circumferential Foot: Barrier cream Wound #4 Right,Circumferential Foot: Barrier cream Primary Wound Dressing: Wound #1 Right,Circumferential Lower Leg: Aquacel Ag Wound #2 Right,Circumferential Lower Leg: Aquacel Ag Wound #3 Left,Circumferential Foot: Aquacel Ag Wound #4 Right,Circumferential Foot: Aquacel Ag Secondary Dressing: Wound #1 Right,Circumferential Lower Leg: ABD pad XtraSorb Wound #2 Right,Circumferential Lower Leg: ABD pad XtraSorb Wound #3 Left,Circumferential Foot: ABD pad XtraSorb Wound #4 Right,Circumferential Foot: ABD pad XtraSorb Dressing Change Frequency: Wound #1 Right,Circumferential Lower Leg: Three times weekly - Home Health to change wraps Monday and Wednesday Pt is seen in clinic on Friday and is changed then Wound #2  Right,Circumferential Lower Leg: [name], Taylin J. ([id number]) Three times weekly - Home Health to change wraps Monday and Wednesday Pt is seen in clinic on Friday and is changed then Wound #3 Left,Circumferential Foot: Three times weekly - Home Health to change wraps Monday and Wednesday Pt is seen in clinic on Friday and is changed then Wound #4 Right,Circumferential Foot: Three times weekly - Home Health to change wraps Monday and Wednesday Pt is seen in clinic on Friday and is changed then Follow-up Appointments: Wound #1 Right,Circumferential Lower Leg: Return Appointment in 1 week. Wound #2 Right,Circumferential Lower Leg: Return Appointment in 1 week. Wound #3 Left,Circumferential Foot: Return Appointment in 1 week. Wound #4 Right,Circumferential Foot: Return Appointment in 1 week. Edema Control: Wound #1 Right,Circumferential Lower Leg: 3 Layer Compression System - Bilateral - Home Health to change Monday and Wednesday Elevate legs to the level of the heart and pump ankles as often as possible Wound #2 Right,Circumferential Lower Leg: 3 Layer Compression System - Bilateral - Home Health to change Monday and Wednesday Elevate legs to the level of the heart and pump ankles as often as possible Wound #3 Left,Circumferential Foot: 3 Layer Compression System - Bilateral - Home Health to change Monday  and Wednesday Elevate legs to the level of the heart and pump ankles as often as possible Wound #4 Right,Circumferential Foot: 3 Layer Compression System - Bilateral - Home Health to change Monday and Wednesday Elevate legs to the level of the heart and pump ankles as often as possible Additional Orders / Instructions: Wound #1 Right,Circumferential Lower Leg: Stop Smoking Increase protein intake. Wound #2 Right,Circumferential Lower Leg: Stop Smoking Increase protein intake. Wound #3 Left,Circumferential Foot: Stop Smoking Increase protein intake. Wound #4  Right,Circumferential Foot: Stop Smoking Increase protein intake. Home Health: Wound #1 Right,Circumferential Lower Leg: Initiate Home Health for Skilled Nursing Home Health Nurse may visit PRN to address patient s wound care needs. FACE TO FACE ENCOUNTER: MEDICARE and MEDICAID PATIENTS: I certify that this patient is under my care and that I had a face-to-face encounter that meets the physician face-to-face encounter requirements with this patient on this date. The encounter with the patient was in whole or in part for the following MEDICAL CONDITION: (primary reason for Home Healthcare) MEDICAL NECESSITY: I certify, [name], [name] SALZWEDEL ([id number]) that based on my findings, NURSING services are a medically necessary home health service. HOME BOUND STATUS: I certify that my clinical findings support that this patient is homebound (i.e., Due to illness or injury, pt requires aid of supportive devices such as crutches, cane, wheelchairs, walkers, the use of special transportation or the assistance of another person to leave their place of residence. There is a normal inability to leave the home and doing so requires considerable and taxing effort. Other absences are for medical reasons / religious services and are infrequent or of short duration when for other reasons). If current dressing causes regression in wound condition, may D/C ordered dressing product/s and apply Normal Saline Moist Dressing daily until next Wound Healing Center / Other MD appointment. Notify Wound Healing Center of regression in wound condition at [phone]. Please direct any NON-WOUND related issues/requests for orders to patient's Primary Care Physician Wound #2 Right,Circumferential Lower Leg: Initiate Home Health for Skilled Nursing Home Health Nurse may visit PRN to address patient s wound care needs. FACE TO FACE ENCOUNTER: MEDICARE and MEDICAID PATIENTS: I certify that this patient is under my care and  that I had a face-to-face encounter that meets the physician face-to-face encounter requirements with this patient on this date. The encounter with the patient was in whole or in part for the following MEDICAL CONDITION: (primary reason for Home Healthcare) MEDICAL NECESSITY: I certify, that based on my findings, NURSING services are a medically necessary home health service. HOME BOUND STATUS: I certify that my clinical findings support that this patient is homebound (i.e., Due to illness or injury, pt requires aid of supportive devices such as crutches, cane, wheelchairs, walkers, the use of special transportation or the assistance of another person to leave their place of residence. There is a normal inability to leave the home and doing so requires considerable and taxing effort. Other absences are for medical reasons / religious services and are infrequent or of short duration when for other reasons). If current dressing causes regression in wound condition, may D/C ordered dressing product/s and apply Normal Saline Moist Dressing daily until next Wound Healing Center / Other MD appointment. Notify Wound Healing Center of regression in wound condition at [phone]. Please direct any NON-WOUND related issues/requests for orders to patient's Primary Care Physician Wound #3 Left,Circumferential Foot: Initiate Home Health for Skilled Nursing Home Health Nurse may visit PRN to address patient s  wound care needs. FACE TO FACE ENCOUNTER: MEDICARE and MEDICAID PATIENTS: I certify that this patient is under my care and that I had a face-to-face encounter that meets the physician face-to-face encounter requirements with this patient on this date. The encounter with the patient was in whole or in part for the following MEDICAL CONDITION: (primary reason for Home Healthcare) MEDICAL NECESSITY: I certify, that based on my findings, NURSING services are a medically necessary home health service.  HOME BOUND STATUS: I certify that my clinical findings support that this patient is homebound (i.e., Due to illness or injury, pt requires aid of supportive devices such as crutches, cane, wheelchairs, walkers, the use of special transportation or the assistance of another person to leave their place of residence. There is a normal inability to leave the home and doing so requires considerable and taxing effort. Other absences are for medical reasons / religious services and are infrequent or of short duration when for other reasons). If current dressing causes regression in wound condition, may D/C ordered dressing product/s and apply Normal Saline Moist Dressing daily until next Wound Healing Center / Other MD appointment. Notify Wound Healing Center of regression in wound condition at [phone]. Please direct any NON-WOUND related issues/requests for orders to patient's Primary Care Physician Wound #4 Right,Circumferential Foot: Initiate Home Health for Skilled Nursing Home Health Nurse may visit PRN to address patient s wound care needs. FACE TO FACE ENCOUNTER: MEDICARE and MEDICAID PATIENTS: I certify that this patient is under my care and that I had a face-to-face encounter that meets the physician face-to-face encounter requirements with this patient on this date. The encounter with the patient was in whole or in part for the following MEDICAL CONDITION: (primary reason for Home Healthcare) MEDICAL NECESSITY: I certify, Shrider, MAYNOR MWANGI ([id number]) that based on my findings, NURSING services are a medically necessary home health service. HOME BOUND STATUS: I certify that my clinical findings support that this patient is homebound (i.e., Due to illness or injury, pt requires aid of supportive devices such as crutches, cane, wheelchairs, walkers, the use of special transportation or the assistance of another person to leave their place of residence. There is a normal inability to  leave the home and doing so requires considerable and taxing effort. Other absences are for medical reasons / religious services and are infrequent or of short duration when for other reasons). If current dressing causes regression in wound condition, may D/C ordered dressing product/s and apply Normal Saline Moist Dressing daily until next Wound Healing Center / Other MD appointment. Notify Wound Healing Center of regression in wound condition at [phone]. Please direct any NON-WOUND related issues/requests for orders to patient's Primary Care Physician I have recommended: 1. Elevation and exercise 2. 3 layer Profore compression wraps to be changed 3 times a week as he has a lot of drainage 3. Spent about 3 minutes discussing with him the need to completely give up smoking and he says he'll be compliant. 4. may wash his legs with soap and water prior to compression wraps been applied. 5. he will be seen back in the wound clinic on a regular basis 6. if he develops further cellulitis or increasing tenderness and redness of his legs he would have to report to the ER for inpatient antibiotics. Electronic Signature(s) Signed: 04/30/2016 3:44:47 PM By: Evlyn Kanner MD, FACS Previous Signature: 04/30/2016 1:35:53 PM Version By: Evlyn Kanner MD, FACS Entered By: Evlyn Kanner on 04/30/2016 15:44:47 Roeder, [name] ([id number]) --------------------------------------------------------------------------------  SuperBill Details Patient Name: ALVAR, MALINOSKI Date of Service: 04/30/2016 Medical Record Number: [id number] Patient Account Number: [id number] Date of Birth/Sex: September 04, 1961 (55 y.o. 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

## 2016-05-01 NOTE — Progress Notes (Signed)
MUSSA, GROESBECK ([id number]) Visit Report for 04/30/2016 Arrival Information Details Patient Name: [name], [name] Date of Service: 04/30/2016 12:45 PM Medical Record Number: [id number] Patient Account Number: [id number] Date of Birth/Sex: [birth year] (55 y.o. Male) Treating RN: Phillis Haggis Primary Care Physician: Corky Downs Other Clinician: Referring Physician: Corky Downs Treating Physician/Extender: Rudene Re in Treatment: 1 Visit Information History Since Last Visit All ordered tests and consults were completed: No Patient Arrived: Ambulatory Added or deleted any medications: No Arrival Time: 12:53 Any new allergies or adverse reactions: No Accompanied By: girlfriend Had a fall or experienced change in No Transfer Assistance: None activities of daily living that may affect Patient Identification Verified: Yes risk of falls: Secondary Verification Process Yes Signs or symptoms of abuse/neglect since last No Completed: visito Patient Requires Transmission- No Hospitalized since last visit: No Based Precautions: Pain Present Now: No Patient Has Alerts: Yes Patient Alerts: Patient on Blood Thinner [name] Electronic Signature(s) Signed: 04/30/2016 4:07:37 PM By: Alejandro Mulling Entered By: Alejandro Mulling on 04/30/2016 12:53:22 Treichler, [name] ([id number]) -------------------------------------------------------------------------------- Encounter Discharge Information Details Patient Name: [name] Date of Service: 04/30/2016 12:45 PM Medical Record Number: [id number] Patient Account Number: [id number] Date of Birth/Sex: 1961-09-15 (55 y.o. Male) Treating RN: Phillis Haggis Primary Care Physician: Corky Downs Other Clinician: Referring Physician: Corky Downs Treating Physician/Extender: Rudene Re in Treatment: 1 Encounter Discharge Information Items Discharge Pain Level: 0 Discharge Condition: Stable Ambulatory  Status: Ambulatory Discharge Destination: Home Transportation: Private Auto Accompanied By: girlfriend Schedule Follow-up Appointment: Yes Medication Reconciliation completed and provided to Patient/Care Yes Laurisa Sahakian: Provided on Clinical Summary of Care: 04/30/2016 Form Type Recipient Paper Patient MD Electronic Signature(s) Signed: 04/30/2016 2:02:29 PM By: Gwenlyn Perking Entered By: Gwenlyn Perking on 04/30/2016 14:02:29 Adell, [name] ([id number]) -------------------------------------------------------------------------------- Lower Extremity Assessment Details Patient Name: [name] Date of Service: 04/30/2016 12:45 PM Medical Record Number: [id number] Patient Account Number: [id number] Date of Birth/Sex: 1961/07/10 (55 y.o. Male) Treating RN: Phillis Haggis Primary Care Physician: Corky Downs Other Clinician: Referring Physician: Corky Downs Treating Physician/Extender: Rudene Re in Treatment: 1 Edema Assessment Assessed: [Left: No] [Right: No] E[Left: dema] [Right: :] Calf Left: Right: Point of Measurement: cm From Medial Instep 50.8 cm 50 cm Ankle Left: Right: Point of Measurement: cm From Medial Instep 30.2 cm 29.2 cm Vascular Assessment Pulses: Posterior Tibial Dorsalis Pedis Palpable: [Left:No] [Right:No] Doppler: [Left:Monophasic] [Right:Multiphasic] Extremity colors, hair growth, and conditions: Extremity Color: [Left:Red] [Right:Red] Temperature of Extremity: [Left:Warm] [Right:Warm] Capillary Refill: [Left:> 3 seconds] [Right:> 3 seconds] Toe Nail Assessment Left: Right: Thick: Yes Yes Discolored: Yes Yes Deformed: Yes Yes Improper Length and Hygiene: Yes Yes Electronic Signature(s) Signed: 04/30/2016 4:07:37 PM By: Alejandro Mulling Entered By: Alejandro Mulling on 04/30/2016 13:17:48 Behnken, [name] ([id number]) -------------------------------------------------------------------------------- Multi Wound Chart  Details Patient Name: [name] Date of Service: 04/30/2016 12:45 PM Medical Record Number: [id number] Patient Account Number: [id number] Date of Birth/Sex: July 25, 1961 (55 y.o. Male) Treating RN: Phillis Haggis Primary Care Physician: Corky Downs Other Clinician: Referring Physician: Corky Downs Treating Physician/Extender: Rudene Re in Treatment: 1 Vital Signs Height(in): 76 Pulse(bpm): 65 Weight(lbs): 320 Blood Pressure 106/73 (mmHg): Body Mass Index(BMI): 39 Temperature(F): 97.7 Respiratory Rate 20 (breaths/min): Photos: [1:No Photos] [2:No Photos] [3:No Photos] Wound Location: [1:Right Lower Leg - Circumfernential] [2:Right Lower Leg - Circumfernential] [3:Left Foot - Circumfernential] Wounding Event: [1:Gradually Appeared] [2:Gradually Appeared] [3:Gradually Appeared] Primary Etiology: [1:Venous Leg Ulcer] [2:Venous Leg Ulcer] [3:Vasculitis] Comorbid History: [1:Arrhythmia, Hypertension] [2:Arrhythmia, Hypertension] [3:Arrhythmia, Hypertension] Date  Acquired: [1:12/14/2015] [2:12/14/2015] [3:12/14/2015] Weeks of Treatment: [1:1] [2:1] [3:1] Wound Status: [1:Open] [2:Open] [3:Open] Measurements L x W x D 17x26x0.1 [2:19x30.6x0.1] [3:9x27x0.1] (cm) Area (cm) : [1:347.146] [2:456.63] [3:190.852] Volume (cm) : [1:34.715] [2:45.663] [3:19.085] % Reduction in Area: [1:0.00%] [2:0.00%] [3:0.00%] % Reduction in Volume: 0.00% [2:0.00%] [3:0.00%] Classification: [1:Partial Thickness] [2:Partial Thickness] [3:Partial Thickness] Exudate Amount: [1:Large] [2:Large] [3:Large] Exudate Type: [1:Serous] [2:Serous] [3:Serous] Exudate Color: [1:amber] [2:amber] [3:amber] Wound Margin: [1:Flat and Intact] [2:Flat and Intact] [3:Flat and Intact] Granulation Amount: [1:Large (67-100%)] [2:Large (67-100%)] [3:Large (67-100%)] Granulation Quality: [1:Red] [2:Red, Pink] [3:Red, Pink] Necrotic Amount: [1:Small (1-33%)] [2:Small (1-33%)] [3:None Present (0%)] Exposed  Structures: [1:Fascia: No Fat: No Tendon: No Muscle: No Joint: No Bone: No] [2:Fascia: No Fat: No Tendon: No Muscle: No Joint: No Bone: No] [3:Fascia: No Fat: No Tendon: No Muscle: No Joint: No Bone: No] Limited to Skin Limited to Skin Limited to Skin Breakdown Breakdown Breakdown Epithelialization: None None None Periwound Skin Texture: Edema: Yes Edema: Yes Edema: Yes Periwound Skin Maceration: Yes No Abnormalities Noted Maceration: Yes Moisture: Moist: Yes Moist: Yes Periwound Skin Color: Erythema: Yes Erythema: Yes Erythema: Yes Erythema Location: Circumferential Circumferential Circumferential Temperature: No Abnormality No Abnormality No Abnormality Tenderness on Yes Yes Yes Palpation: Wound Preparation: Ulcer Cleansing: Other: Ulcer Cleansing: Other: Ulcer Cleansing: Other: soap and water soap and water soap and water Topical Anesthetic Topical Anesthetic Topical Anesthetic Applied: None Applied: Other: lidocaine Applied: None % Wound Number: 4 N/A N/A Photos: No Photos N/A N/A Wound Location: Right Foot - N/A N/A Circumfernential Wounding Event: Gradually Appeared N/A N/A Primary Etiology: Vasculitis N/A N/A Comorbid History: Arrhythmia, Hypertension N/A N/A Date Acquired: 12/14/2015 N/A N/A Weeks of Treatment: 1 N/A N/A Wound Status: Open N/A N/A Measurements L x W x D 10x28x0.1 N/A N/A (cm) Area (cm) : 219.911 N/A N/A Volume (cm) : 21.991 N/A N/A % Reduction in Area: 0.00% N/A N/A % Reduction in Volume: 0.00% N/A N/A Classification: Partial Thickness N/A N/A Exudate Amount: Large N/A N/A Exudate Type: Serous N/A N/A Exudate Color: amber N/A N/A Wound Margin: Flat and Intact N/A N/A Granulation Amount: Large (67-100%) N/A N/A Granulation Quality: Red, Pink N/A N/A Necrotic Amount: None Present (0%) N/A N/A Exposed Structures: Fascia: No N/A N/A Fat: No Tendon: No Muscle: No Joint: No Bone: No Leech, [name] J. ([id number]) Limited to  Skin Breakdown Epithelialization: None N/A N/A Periwound Skin Texture: Edema: Yes N/A N/A Periwound Skin Maceration: Yes N/A N/A Moisture: Moist: Yes Periwound Skin Color: Erythema: Yes N/A N/A Erythema Location: Circumferential N/A N/A Temperature: No Abnormality N/A N/A Tenderness on Yes N/A N/A Palpation: Wound Preparation: Ulcer Cleansing: Other: N/A N/A soap and water Topical Anesthetic Applied: None Treatment Notes Electronic Signature(s) Signed: 04/30/2016 4:07:37 PM By: Alejandro Mulling Entered By: Alejandro Mulling on 04/30/2016 13:21:42 Mayon, [name] ([id number]) -------------------------------------------------------------------------------- Multi-Disciplinary Care Plan Details Patient Name: [name] Date of Service: 04/30/2016 12:45 PM Medical Record Number: [id number] Patient Account Number: [id number] Date of Birth/Sex: 04/21/1961 (55 y.o. Male) Treating RN: Phillis Haggis Primary Care Physician: Corky Downs Other Clinician: Referring Physician: Corky Downs Treating Physician/Extender: Rudene Re in Treatment: 1 Active Inactive Orientation to the Wound Care Program Nursing Diagnoses: Knowledge deficit related to the wound healing center program Goals: Patient/caregiver will verbalize understanding of the Wound Healing Center Program Date Initiated: 04/23/2016 Goal Status: Active Interventions: Provide education on orientation to the wound center Notes: Pain, Acute or Chronic Nursing Diagnoses: Pain, acute or chronic: actual or potential Potential alteration in  comfort, pain Goals: Patient will verbalize adequate pain control and receive pain control interventions during procedures as needed Date Initiated: 04/23/2016 Goal Status: Active Patient/caregiver will verbalize adequate pain control between visits Date Initiated: 04/23/2016 Goal Status: Active Interventions: Assess comfort goal upon admission Complete pain  assessment as per visit requirements Notes: Wound/Skin Impairment Asato, Wadie Shela CommonsJ. ([id number]) Nursing Diagnoses: Impaired tissue integrity Knowledge deficit related to smoking impact on wound healing Knowledge deficit related to ulceration/compromised skin integrity Goals: Ulcer/skin breakdown will have a volume reduction of 30% by week 4 Date Initiated: 04/23/2016 Goal Status: Active Ulcer/skin breakdown will have a volume reduction of 50% by week 8 Date Initiated: 04/23/2016 Goal Status: Active Ulcer/skin breakdown will have a volume reduction of 80% by week 12 Date Initiated: 04/23/2016 Goal Status: Active Interventions: Assess ulceration(s) every visit Notes: Electronic Signature(s) Signed: 04/30/2016 4:07:37 PM By: Alejandro MullingPinkerton, Debra Entered By: Alejandro MullingPinkerton, Debra on 04/30/2016 13:20:47 Nicasio, [name] ([id number]) -------------------------------------------------------------------------------- Pain Assessment Details Patient Name: [name] RouteAMERON, [name] J. Date of Service: 04/30/2016 12:45 PM Medical Record Number: [id number] Patient Account Number: [id number] Date of Birth/Sex: 09-04-1961 [age] y.o. Male) Treating RN: Phillis HaggisPinkerton, Debi Primary Care Physician: Corky DownsMASOUD, [name] Other Clinician: Referring Physician: Corky DownsMASOUD, [name] Treating Physician/Extender: Rudene ReBritto, Errol Weeks in Treatment: 1 Active Problems Location of Pain Severity and Description of Pain Patient Has Paino No Site Locations Pain Management and Medication Current Pain Management: Electronic Signature(s) Signed: 04/30/2016 4:07:37 PM By: Alejandro MullingPinkerton, Debra Entered By: Alejandro MullingPinkerton, Debra on 04/30/2016 12:53:29 Selders, [name] ([id number]) -------------------------------------------------------------------------------- Patient/Caregiver Education Details Patient Name: [name] RouteAMERON, Valon J. Date of Service: 04/30/2016 12:45 PM Medical Record Number: [id number] Patient Account Number: [id number] Date of Birth/Gender:  09-04-1961 [age] y.o. Male) Treating RN: Phillis HaggisPinkerton, Debi Primary Care Physician: Corky DownsMASOUD, [name] Other Clinician: Referring Physician: Corky DownsMASOUD, [name] Treating Physician/Extender: Rudene ReBritto, Errol Weeks in Treatment: 1 Education Assessment Education Provided To: Patient Education Topics Provided Wound/Skin Impairment: Handouts: Other: do not get wrraps wet Methods: Demonstration, Explain/Verbal Responses: State content correctly Electronic Signature(s) Signed: 04/30/2016 4:07:37 PM By: Alejandro MullingPinkerton, Debra Entered By: Alejandro MullingPinkerton, Debra on 04/30/2016 14:01:02 Tricarico, [name] ([id number]) -------------------------------------------------------------------------------- Wound Assessment Details Patient Name: [name] RouteAMERON, Siddhant J. Date of Service: 04/30/2016 12:45 PM Medical Record Number: [id number] Patient Account Number: [id number] Date of Birth/Sex: 09-04-1961 [age] y.o. Male) Treating RN: Phillis HaggisPinkerton, Debi Primary Care Physician: Corky DownsMASOUD, [name] Other Clinician: Referring Physician: Corky DownsMASOUD, [name] Treating Physician/Extender: Rudene ReBritto, Errol Weeks in Treatment: 1 Wound Status Wound Number: 1 Primary Etiology: Venous Leg Ulcer Wound Location: Right Lower Leg - Wound Status: Open Circumfernential Comorbid History: Arrhythmia, Hypertension Wounding Event: Gradually Appeared Date Acquired: 12/14/2015 Weeks Of Treatment: 1 Clustered Wound: No Photos Photo Uploaded By: Alejandro MullingPinkerton, Debra on 04/30/2016 15:51:33 Wound Measurements Length: (cm) 17 Width: (cm) 26 Depth: (cm) 0.1 Area: (cm) 347.146 Volume: (cm) 34.715 % Reduction in Area: 0% % Reduction in Volume: 0% Epithelialization: None Tunneling: No Undermining: No Wound Description Classification: Partial Thickness Wound Margin: Flat and Intact Exudate Amount: Large Exudate Type: Serous Exudate Color: amber Foul Odor After Cleansing: No Wound Bed Granulation Amount: Large (67-100%) Exposed Structure Granulation Quality: Red Fascia  Exposed: No Necrotic Amount: Small (1-33%) Fat Layer Exposed: No [name], [name] J. ([id number]) Necrotic Quality: Adherent Slough Tendon Exposed: No Muscle Exposed: No Joint Exposed: No Bone Exposed: No Limited to Skin Breakdown Periwound Skin Texture Texture Color No Abnormalities Noted: No No Abnormalities Noted: No Localized Edema: Yes Erythema: Yes Erythema Location: Circumferential Moisture No Abnormalities Noted: No Temperature / Pain Maceration: Yes Temperature: No Abnormality Moist: Yes Tenderness on Palpation: Yes  Wound Preparation Ulcer Cleansing: Other: soap and water, Topical Anesthetic Applied: None Treatment Notes Wound #1 (Right, Circumferential Lower Leg) 1. Cleansed with: Cleanse wound with antibacterial soap and water 2. Anesthetic Topical Lidocaine 4% cream to wound bed prior to debridement 3. Peri-wound Care: Barrier cream 4. Dressing Applied: Aquacel Ag 5. Secondary Dressing Applied ABD Pad 7. Secured with 3 Layer Compression System - Bilateral Notes xtrasorb Electronic Signature(s) Signed: 04/30/2016 4:07:37 PM By: Alejandro Mulling Entered By: Alejandro Mulling on 04/30/2016 13:18:12 Laubach, [name] ([id number]) -------------------------------------------------------------------------------- Wound Assessment Details Patient Name: [name] Date of Service: 04/30/2016 12:45 PM Medical Record Number: [id number] Patient Account Number: [id number] Date of Birth/Sex: Jul 07, 1961 (55 y.o. Male) Treating RN: Phillis Haggis Primary Care Physician: Corky Downs Other Clinician: Referring Physician: Corky Downs Treating Physician/Extender: Rudene Re in Treatment: 1 Wound Status Wound Number: 2 Primary Etiology: Venous Leg Ulcer Wound Location: Right Lower Leg - Wound Status: Open Circumfernential Comorbid History: Arrhythmia, Hypertension Wounding Event: Gradually Appeared Date Acquired: 12/14/2015 Weeks Of Treatment:  1 Clustered Wound: No Photos Photo Uploaded By: Alejandro Mulling on 04/30/2016 15:51:33 Wound Measurements Length: (cm) 19 Width: (cm) 30.6 Depth: (cm) 0.1 Area: (cm) 456.63 Volume: (cm) 45.663 % Reduction in Area: 0% % Reduction in Volume: 0% Epithelialization: None Tunneling: No Undermining: No Wound Description Classification: Partial Thickness Wound Margin: Flat and Intact Exudate Amount: Large Exudate Type: Serous Exudate Color: amber Wound Bed Granulation Amount: Large (67-100%) Exposed Structure Granulation Quality: Red, Pink Fascia Exposed: No Necrotic Amount: Small (1-33%) Fat Layer Exposed: No Reinheimer, [name] J. ([id number]) Necrotic Quality: Adherent Slough Tendon Exposed: No Muscle Exposed: No Joint Exposed: No Bone Exposed: No Limited to Skin Breakdown Periwound Skin Texture Texture Color No Abnormalities Noted: No No Abnormalities Noted: No Localized Edema: Yes Erythema: Yes Erythema Location: Circumferential Moisture No Abnormalities Noted: No Temperature / Pain Temperature: No Abnormality Tenderness on Palpation: Yes Wound Preparation Ulcer Cleansing: Other: soap and water, Topical Anesthetic Applied: Other: lidocaine %, Treatment Notes Wound #2 (Right, Circumferential Lower Leg) 1. Cleansed with: Cleanse wound with antibacterial soap and water 2. Anesthetic Topical Lidocaine 4% cream to wound bed prior to debridement 3. Peri-wound Care: Barrier cream 4. Dressing Applied: Aquacel Ag 5. Secondary Dressing Applied ABD Pad 7. Secured with 3 Layer Compression System - Bilateral Notes [employer] operational [employer]) Signed: 04/30/2016 4:07:37 PM By: Alejandro Mulling Entered By: Alejandro Mulling on 04/30/2016 13:18:30 Null, [name] ([id number]) -------------------------------------------------------------------------------- Wound Assessment Details Patient Name: [name] Date of Service: 04/30/2016 12:45 PM Medical  Record Number: [id number] Patient Account Number: [id number] Date of Birth/Sex: 14-May-1961 (55 y.o. Male) Treating RN: Phillis Haggis Primary Care Physician: Corky Downs Other Clinician: Referring Physician: Corky Downs Treating Physician/Extender: Rudene Re in Treatment: 1 Wound Status Wound Number: 3 Primary Etiology: Vasculitis Wound Location: Left Foot - Circumfernential Wound Status: Open Wounding Event: Gradually Appeared Comorbid History: Arrhythmia, Hypertension Date Acquired: 12/14/2015 Weeks Of Treatment: 1 Clustered Wound: No Photos Photo Uploaded By: Alejandro Mulling on 04/30/2016 15:51:47 Wound Measurements Length: (cm) 9 Width: (cm) 27 Depth: (cm) 0.1 Area: (cm) 190.852 Volume: (cm) 19.085 % Reduction in Area: 0% % Reduction in Volume: 0% Epithelialization: None Tunneling: No Undermining: No Wound Description Classification: Partial Thickness Wound Margin: Flat and Intact Exudate Amount: Large Exudate Type: Serous Exudate Color: amber Foul Odor After Cleansing: No Wound Bed Granulation Amount: Large (67-100%) Exposed Structure Granulation Quality: Red, Pink Fascia Exposed: No Necrotic Amount: None Present (0%) Fat Layer Exposed: No Tendon Exposed: No Agner, [name]  J. ([id number]) Muscle Exposed: No Joint Exposed: No Bone Exposed: No Limited to Skin Breakdown Periwound Skin Texture Texture Color No Abnormalities Noted: No No Abnormalities Noted: No Localized Edema: Yes Erythema: Yes Erythema Location: Circumferential Moisture No Abnormalities Noted: No Temperature / Pain Maceration: Yes Temperature: No Abnormality Moist: Yes Tenderness on Palpation: Yes Wound Preparation Ulcer Cleansing: Other: soap and water, Topical Anesthetic Applied: None Treatment Notes Wound #3 (Left, Circumferential Foot) 1. Cleansed with: Cleanse wound with antibacterial soap and water 2. Anesthetic Topical Lidocaine 4% cream to wound bed  prior to debridement 3. Peri-wound Care: Barrier cream 4. Dressing Applied: Aquacel Ag 5. Secondary Dressing Applied ABD Pad 7. Secured with 3 Layer Compression System - Bilateral Notes [employer] forces operational officer) Signed: 04/30/2016 4:07:37 PM By: Alejandro Mulling Entered By: Alejandro Mulling on 04/30/2016 13:18:53 Magoon, [name] ([id number]) -------------------------------------------------------------------------------- Wound Assessment Details Patient Name: [name] Date of Service: 04/30/2016 12:45 PM Medical Record Number: [id number] Patient Account Number: [id number] Date of Birth/Sex: [birth year] (55 y.o. Male) Treating RN: Phillis Haggis Primary Care Physician: Corky Downs Other Clinician: Referring Physician: Corky Downs Treating Physician/Extender: Rudene Re in Treatment: 1 Wound Status Wound Number: 4 Primary Etiology: Vasculitis Wound Location: Right Foot - Circumfernential Wound Status: Open Wounding Event: Gradually Appeared Comorbid History: Arrhythmia, Hypertension Date Acquired: 12/14/2015 Weeks Of Treatment: 1 Clustered Wound: No Photos Photo Uploaded By: Alejandro Mulling on 04/30/2016 15:51:47 Wound Measurements Length: (cm) 10 Width: (cm) 28 Depth: (cm) 0.1 Area: (cm) 219.911 Volume: (cm) 21.991 % Reduction in Area: 0% % Reduction in Volume: 0% Epithelialization: None Tunneling: No Undermining: No Wound Description Classification: Partial Thickness Wound Margin: Flat and Intact Exudate Amount: Large Exudate Type: Serous Exudate Color: amber Foul Odor After Cleansing: No Wound Bed Granulation Amount: Large (67-100%) Exposed Structure Granulation Quality: Red, Pink Fascia Exposed: No Necrotic Amount: None Present (0%) Fat Layer Exposed: No Tendon Exposed: No Peeples, [name] J. ([id number]) Muscle Exposed: No Joint Exposed: No Bone Exposed: No Limited to Skin Breakdown Periwound Skin Texture Texture  Color No Abnormalities Noted: No No Abnormalities Noted: No Localized Edema: Yes Erythema: Yes Erythema Location: Circumferential Moisture No Abnormalities Noted: No Temperature / Pain Maceration: Yes Temperature: No Abnormality Moist: Yes Tenderness on Palpation: Yes Wound Preparation Ulcer Cleansing: Other: soap and water, Topical Anesthetic Applied: None Treatment Notes Wound #4 (Right, Circumferential Foot) 1. Cleansed with: Cleanse wound with antibacterial soap and water 2. Anesthetic Topical Lidocaine 4% cream to wound bed prior to debridement 3. Peri-wound Care: Barrier cream 4. Dressing Applied: Aquacel Ag 5. Secondary Dressing Applied ABD Pad 7. Secured with 3 Layer Compression System - Bilateral Notes xtrasorb Electronic Signature(s) Signed: 04/30/2016 4:07:37 PM By: Alejandro Mulling Entered By: Alejandro Mulling on 04/30/2016 13:19:10 Ressel, [name] ([id number]) -------------------------------------------------------------------------------- Vitals Details Patient Name: [name] Date of Service: 04/30/2016 12:45 PM Medical Record Number: [id number] Patient Account Number: [id number] Date of Birth/Sex: [birth year] (55 y.o. Male) Treating RN: Ashok Cordia, Debi Primary Care Physician: Corky Downs Other Clinician: Referring Physician: Corky Downs Treating Physician/Extender: Rudene Re in Treatment: 1 Vital Signs Time Taken: 12:54 Temperature (F): 97.7 Height (in): 76 Pulse (bpm): 65 Weight (lbs): 320 Respiratory Rate (breaths/min): 20 Body Mass Index (BMI): 38.9 Blood Pressure (mmHg): 106/73 Reference Range: 80 - 120 mg / dl Electronic Signature(s) Signed: 04/30/2016 4:07:37 PM By: Alejandro Mulling Entered By: Alejandro Mulling on 04/30/2016 12:56:54

## 2016-05-07 ENCOUNTER — Encounter: Payer: BC Managed Care – PPO | Admitting: Surgery

## 2016-05-07 DIAGNOSIS — I890 Lymphedema, not elsewhere classified: Secondary | ICD-10-CM | POA: Diagnosis not present

## 2016-05-08 NOTE — Progress Notes (Signed)
[name], Jaxxson J. ([id number]) Visit Report for 05/07/2016 Arrival Information Details Patient Name: [name], [name] J. Date of Service: 05/07/2016 9:30 AM Medical Record Number: [id number] Patient Account Number: [id number] Date of Birth/Sex: November 21, 1961 (55 y.o. Male) Treating RN: Phillis HaggisPinkerton, Debi Primary Care Physician: Corky DownsMASOUD, [name] Other Clinician: Referring Physician: Corky DownsMASOUD, [name] Treating Physician/Extender: Rudene ReBritto, Errol Weeks in Treatment: 2 Visit Information History Since Last Visit All ordered tests and consults were completed: No Patient Arrived: Ambulatory Added or deleted any medications: No Arrival Time: 09:31 Any new allergies or adverse reactions: No Accompanied By: girlfriend Had a fall or experienced change in No Transfer Assistance: None activities of daily living that may affect Patient Identification Verified: Yes risk of falls: Secondary Verification Process Yes Signs or symptoms of abuse/neglect since last No Completed: visito Patient Requires Transmission- No Hospitalized since last visit: No Based Precautions: Pain Present Now: No Patient Has Alerts: Yes Patient Alerts: Patient on Blood Thinner [name] Electronic Signature(s) Signed: 05/07/2016 4:37:51 PM By: Alejandro MullingPinkerton, Debra Entered By: Alejandro MullingPinkerton, Debra on 05/07/2016 09:34:24 Dorton, [name] ([id number]) -------------------------------------------------------------------------------- Encounter Discharge Information Details Patient Name: [name] RouteAMERON, [name] J. Date of Service: 05/07/2016 9:30 AM Medical Record Number: [id number] Patient Account Number: [id number] Date of Birth/Sex: November 21, 1961 [age] y.o. Male) Treating RN: Phillis HaggisPinkerton, Debi Primary Care Physician: Corky DownsMASOUD, [name] Other Clinician: Referring Physician: Corky DownsMASOUD, [name] Treating Physician/Extender: Rudene ReBritto, Errol Weeks in Treatment: 2 Encounter Discharge Information Items Discharge Pain Level: 0 Discharge Condition: Stable Ambulatory  Status: Ambulatory Discharge Destination: Home Transportation: Private Auto Accompanied By: girlfriend Schedule Follow-up Appointment: Yes Medication Reconciliation completed and provided to Patient/Care Yes Maurina Fawaz: Provided on Clinical Summary of Care: 05/07/2016 Form Type Recipient Paper Patient MD Electronic Signature(s) Signed: 05/07/2016 10:53:01 AM By: Gwenlyn PerkingMoore, Shelia Entered By: Gwenlyn PerkingMoore, Shelia on 05/07/2016 10:53:01 Meloche, [name] ([id number]) -------------------------------------------------------------------------------- Lower Extremity Assessment Details Patient Name: [name] RouteAMERON, [name] J. Date of Service: 05/07/2016 9:30 AM Medical Record Number: [id number] Patient Account Number: [id number] Date of Birth/Sex: November 21, 1961 [age] y.o. Male) Treating RN: Ashok CordiaPinkerton, Debi Primary Care Physician: Corky DownsMASOUD, [name] Other Clinician: Referring Physician: Corky DownsMASOUD, [name] Treating Physician/Extender: Rudene ReBritto, Errol Weeks in Treatment: 2 Edema Assessment Assessed: [Left: No] [Right: No] E[Left: dema] [Right: :] Calf Left: Right: Point of Measurement: 38 cm From Medial Instep 50 cm 50.2 cm Ankle Left: Right: Point of Measurement: 13 cm From Medial Instep 29 cm 29.2 cm Vascular Assessment Pulses: Posterior Tibial Dorsalis Pedis Palpable: [Left:No] [Right:No] Doppler: [Left:Monophasic] [Right:Multiphasic] Extremity colors, hair growth, and conditions: Extremity Color: [Left:Red] [Right:Red] Temperature of Extremity: [Left:Warm] [Right:Warm] Capillary Refill: [Left:< 3 seconds] [Right:< 3 seconds] Electronic Signature(s) Signed: 05/07/2016 4:37:51 PM By: Alejandro MullingPinkerton, Debra Entered By: Alejandro MullingPinkerton, Debra on 05/07/2016 09:56:42 Offield, [name] ([id number]) -------------------------------------------------------------------------------- Multi Wound Chart Details Patient Name: [name] RouteAMERON, [name] J. Date of Service: 05/07/2016 9:30 AM Medical Record Number: [id number] Patient Account  Number: [id number] Date of Birth/Sex: November 21, 1961 [age] y.o. Male) Treating RN: Phillis HaggisPinkerton, Debi Primary Care Physician: Corky DownsMASOUD, [name] Other Clinician: Referring Physician: Corky DownsMASOUD, [name] Treating Physician/Extender: Rudene ReBritto, Errol Weeks in Treatment: 2 Vital Signs Height(in): 76 Pulse(bpm): 78 Weight(lbs): 320 Blood Pressure 146/105 (mmHg): Body Mass Index(BMI): 39 Temperature(F): 98.2 Respiratory Rate 20 (breaths/min): Photos: [1:No Photos] [2:No Photos] [3:No Photos] Wound Location: [1:Right Lower Leg - Circumfernential] [2:Left Lower Leg - Circumfernential] [3:Left Foot - Circumfernential] Wounding Event: [1:Gradually Appeared] [2:Gradually Appeared] [3:Gradually Appeared] Primary Etiology: [1:Venous Leg Ulcer] [2:Venous Leg Ulcer] [3:Vasculitis] Comorbid History: [1:Arrhythmia, Hypertension] [2:Arrhythmia, Hypertension] [3:Arrhythmia, Hypertension] Date Acquired: [1:12/14/2015] [2:12/14/2015] [3:12/14/2015] Weeks of Treatment: [1:2] [2:2] [3:2] Wound Status: [1:Open] [2:Open] [3:Open] Measurements L x  W x D 18x27x0.1 [2:20x32x0.2] [3:10x28x0.1] (cm) Area (cm) : [1:381.704] [2:502.655] [3:219.911] Volume (cm) : [1:38.17] [2:100.531] [3:21.991] % Reduction in Area: [1:-10.00%] [2:-10.10%] [3:-15.20%] % Reduction in Volume: -10.00% [2:-120.20%] [3:-15.20%] Classification: [1:Partial Thickness] [2:Partial Thickness] [3:Partial Thickness] Exudate Amount: [1:Large] [2:Large] [3:Large] Exudate Type: [1:Serous] [2:Serous] [3:Serous] Exudate Color: [1:amber] [2:amber] [3:amber] Wound Margin: [1:Flat and Intact] [2:Flat and Intact] [3:Flat and Intact] Granulation Amount: [1:Large (67-100%)] [2:Large (67-100%)] [3:Large (67-100%)] Granulation Quality: [1:Red] [2:Red, Pink] [3:Red, Pink] Necrotic Amount: [1:Small (1-33%)] [2:Small (1-33%)] [3:None Present (0%)] Exposed Structures: [1:Fascia: No Fat: No Tendon: No Muscle: No Joint: No Bone: No] [2:Fascia: No Fat: No Tendon: No Muscle: No  Joint: No Bone: No] [3:Fascia: No Fat: No Tendon: No Muscle: No Joint: No Bone: No] Limited to Skin Limited to Skin Limited to Skin Breakdown Breakdown Breakdown Epithelialization: None None None Periwound Skin Texture: Edema: Yes Edema: Yes Edema: Yes Periwound Skin Maceration: Yes No Abnormalities Noted Maceration: Yes Moisture: Moist: Yes Moist: Yes Periwound Skin Color: Erythema: Yes Erythema: Yes Erythema: Yes Erythema Location: Circumferential Circumferential Circumferential Temperature: No Abnormality No Abnormality No Abnormality Tenderness on Yes Yes Yes Palpation: Wound Preparation: Ulcer Cleansing: Other: Ulcer Cleansing: Other: Ulcer Cleansing: Other: soap and water soap and water soap and water Topical Anesthetic Topical Anesthetic Topical Anesthetic Applied: None Applied: Other: lidocaine Applied: None % Wound Number: 4 N/A N/A Photos: No Photos N/A N/A Wound Location: Right Foot - N/A N/A Circumfernential Wounding Event: Gradually Appeared N/A N/A Primary Etiology: Vasculitis N/A N/A Comorbid History: Arrhythmia, Hypertension N/A N/A Date Acquired: 12/14/2015 N/A N/A Weeks of Treatment: 2 N/A N/A Wound Status: Open N/A N/A Measurements L x W x D 11x28x0.1 N/A N/A (cm) Area (cm) : 241.903 N/A N/A Volume (cm) : 24.19 N/A N/A % Reduction in Area: -10.00% N/A N/A % Reduction in Volume: -10.00% N/A N/A Classification: Partial Thickness N/A N/A Exudate Amount: Large N/A N/A Exudate Type: Serous N/A N/A Exudate Color: amber N/A N/A Wound Margin: Flat and Intact N/A N/A Granulation Amount: Large (67-100%) N/A N/A Granulation Quality: Red, Pink N/A N/A Necrotic Amount: None Present (0%) N/A N/A Exposed Structures: Fascia: No N/A N/A Fat: No Tendon: No Muscle: No Joint: No Bone: No Smisek, Deandra J. ([id number]) Limited to Skin Breakdown Epithelialization: None N/A N/A Periwound Skin Texture: Edema: Yes N/A N/A Periwound Skin Maceration: Yes N/A  N/A Moisture: Moist: Yes Periwound Skin Color: Erythema: Yes N/A N/A Erythema Location: Circumferential N/A N/A Temperature: No Abnormality N/A N/A Tenderness on Yes N/A N/A Palpation: Wound Preparation: Ulcer Cleansing: Other: N/A N/A soap and water Topical Anesthetic Applied: None Treatment Notes Electronic Signature(s) Signed: 05/07/2016 4:37:51 PM By: Alejandro Mulling Entered By: Alejandro Mulling on 05/07/2016 10:01:21 Mayweather, [name] ([id number]) -------------------------------------------------------------------------------- Multi-Disciplinary Care Plan Details Patient Name: [name] Route Date of Service: 05/07/2016 9:30 AM Medical Record Number: [id number] Patient Account Number: [id number] Date of Birth/Sex: Sep 04, 1961 (55 y.o. Male) Treating RN: Phillis Haggis Primary Care Physician: Corky Downs Other Clinician: Referring Physician: Corky Downs Treating Physician/Extender: Rudene Re in Treatment: 2 Active Inactive Orientation to the Wound Care Program Nursing Diagnoses: Knowledge deficit related to the wound healing center program Goals: Patient/caregiver will verbalize understanding of the Wound Healing Center Program Date Initiated: 04/23/2016 Goal Status: Active Interventions: Provide education on orientation to the wound center Notes: Pain, Acute or Chronic Nursing Diagnoses: Pain, acute or chronic: actual or potential Potential alteration in comfort, pain Goals: Patient will verbalize adequate pain control and receive pain control interventions during procedures as needed  Date Initiated: 04/23/2016 Goal Status: Active Patient/caregiver will verbalize adequate pain control between visits Date Initiated: 04/23/2016 Goal Status: Active Interventions: Assess comfort goal upon admission Complete pain assessment as per visit requirements Notes: Wound/Skin Impairment Romey, Erian Shela Commons ([id number]) Nursing Diagnoses: Impaired tissue  integrity Knowledge deficit related to smoking impact on wound healing Knowledge deficit related to ulceration/compromised skin integrity Goals: Ulcer/skin breakdown will have a volume reduction of 30% by week 4 Date Initiated: 04/23/2016 Goal Status: Active Ulcer/skin breakdown will have a volume reduction of 50% by week 8 Date Initiated: 04/23/2016 Goal Status: Active Ulcer/skin breakdown will have a volume reduction of 80% by week 12 Date Initiated: 04/23/2016 Goal Status: Active Interventions: Assess ulceration(s) every visit Notes: Electronic Signature(s) Signed: 05/07/2016 4:37:51 PM By: Alejandro Mulling Entered By: Alejandro Mulling on 05/07/2016 10:01:14 Meritt, [name] ([id number]) -------------------------------------------------------------------------------- Pain Assessment Details Patient Name: [name] Route Date of Service: 05/07/2016 9:30 AM Medical Record Number: [id number] Patient Account Number: [id number] Date of Birth/Sex: 1961/04/08 (55 y.o. Male) Treating RN: Phillis Haggis Primary Care Physician: Corky Downs Other Clinician: Referring Physician: Corky Downs Treating Physician/Extender: Rudene Re in Treatment: 2 Active Problems Location of Pain Severity and Description of Pain Patient Has Paino No Site Locations Pain Management and Medication Current Pain Management: Electronic Signature(s) Signed: 05/07/2016 4:37:51 PM By: Alejandro Mulling Entered By: Alejandro Mulling on 05/07/2016 09:34:30 Fiebig, [name] ([id number]) -------------------------------------------------------------------------------- Patient/Caregiver Education Details Patient Name: [name] Route Date of Service: 05/07/2016 9:30 AM Medical Record Number: [id number] Patient Account Number: [id number] Date of Birth/Gender: [birth year] (55 y.o. Male) Treating RN: Phillis Haggis Primary Care Physician: Corky Downs Other Clinician: Referring Physician: Corky Downs Treating Physician/Extender: Rudene Re in Treatment: 2 Education Assessment Education Provided To: Patient Education Topics Provided Wound/Skin Impairment: Handouts: Other: change dressing as ordered, do not get wraps wet Methods: Demonstration, Explain/Verbal Responses: State content correctly Electronic Signature(s) Signed: 05/07/2016 4:37:51 PM By: Alejandro Mulling Entered By: Alejandro Mulling on 05/07/2016 10:13:43 Szostak, [name] ([id number]) -------------------------------------------------------------------------------- Wound Assessment Details Patient Name: [name] Route Date of Service: 05/07/2016 9:30 AM Medical Record Number: [id number] Patient Account Number: [id number] Date of Birth/Sex: 05/26/1961 (55 y.o. Male) Treating RN: Phillis Haggis Primary Care Physician: Corky Downs Other Clinician: Referring Physician: Corky Downs Treating Physician/Extender: Rudene Re in Treatment: 2 Wound Status Wound Number: 1 Primary Etiology: Venous Leg Ulcer Wound Location: Right Lower Leg - Wound Status: Open Circumfernential Comorbid History: Arrhythmia, Hypertension Wounding Event: Gradually Appeared Date Acquired: 12/14/2015 Weeks Of Treatment: 2 Clustered Wound: No Photos Photo Uploaded By: Alejandro Mulling on 05/07/2016 16:26:05 Wound Measurements Length: (cm) 18 Width: (cm) 27 Depth: (cm) 0.1 Area: (cm) 381.704 Volume: (cm) 38.17 % Reduction in Area: -10% % Reduction in Volume: -10% Epithelialization: None Tunneling: No Undermining: No Wound Description Classification: Partial Thickness Wound Margin: Flat and Intact Exudate Amount: Large Exudate Type: Serous Exudate Color: amber Foul Odor After Cleansing: No Wound Bed Granulation Amount: Large (67-100%) Exposed Structure Granulation Quality: Red Fascia Exposed: No Necrotic Amount: Small (1-33%) Fat Layer Exposed: No Coffin, Kahleb J. ([id number]) Necrotic  Quality: Adherent Slough Tendon Exposed: No Muscle Exposed: No Joint Exposed: No Bone Exposed: No Limited to Skin Breakdown Periwound Skin Texture Texture Color No Abnormalities Noted: No No Abnormalities Noted: No Localized Edema: Yes Erythema: Yes Erythema Location: Circumferential Moisture No Abnormalities Noted: No Temperature / Pain Maceration: Yes Temperature: No Abnormality Moist: Yes Tenderness on Palpation: Yes Wound Preparation Ulcer Cleansing: Other: soap and water, Topical Anesthetic Applied: None Treatment Notes  Wound #1 (Right, Circumferential Lower Leg) 1. Cleansed with: Cleanse wound with antibacterial soap and water 2. Anesthetic Topical Lidocaine 4% cream to wound bed prior to debridement 3. Peri-wound Care: Barrier cream 4. Dressing Applied: Aquacel Ag 5. Secondary Dressing Applied ABD Pad 7. Secured with Tape 3 Layer Compression System - Bilateral Notes xtrasorb Electronic Signature(s) Signed: 05/07/2016 4:37:51 PM By: Alejandro Mulling Entered By: Alejandro Mulling on 05/07/2016 09:59:33 Laury, [name] ([id number]) -------------------------------------------------------------------------------- Wound Assessment Details Patient Name: [name] Route Date of Service: 05/07/2016 9:30 AM Medical Record Number: [id number] Patient Account Number: [id number] Date of Birth/Sex: November 17, 1961 (55 y.o. Male) Treating RN: Phillis Haggis Primary Care Physician: Corky Downs Other Clinician: Referring Physician: Corky Downs Treating Physician/Extender: Rudene Re in Treatment: 2 Wound Status Wound Number: 2 Primary Etiology: Venous Leg Ulcer Wound Location: Left Lower Leg - Wound Status: Open Circumfernential Comorbid History: Arrhythmia, Hypertension Wounding Event: Gradually Appeared Date Acquired: 12/14/2015 Weeks Of Treatment: 2 Clustered Wound: No Photos Photo Uploaded By: Alejandro Mulling on 05/07/2016 16:26:05 Wound  Measurements Length: (cm) 20 Width: (cm) 32 Depth: (cm) 0.2 Area: (cm) 502.655 Volume: (cm) 100.531 % Reduction in Area: -10.1% % Reduction in Volume: -120.2% Epithelialization: None Tunneling: No Undermining: No Wound Description Classification: Partial Thickness Wound Margin: Flat and Intact Exudate Amount: Large Exudate Type: Serous Exudate Color: amber Wound Bed Granulation Amount: Large (67-100%) Exposed Structure Granulation Quality: Red, Pink Fascia Exposed: No Necrotic Amount: Small (1-33%) Fat Layer Exposed: No Gasaway, Jahzeel J. ([id number]) Necrotic Quality: Adherent Slough Tendon Exposed: No Muscle Exposed: No Joint Exposed: No Bone Exposed: No Limited to Skin Breakdown Periwound Skin Texture Texture Color No Abnormalities Noted: No No Abnormalities Noted: No Localized Edema: Yes Erythema: Yes Erythema Location: Circumferential Moisture No Abnormalities Noted: No Temperature / Pain Temperature: No Abnormality Tenderness on Palpation: Yes Wound Preparation Ulcer Cleansing: Other: soap and water, Topical Anesthetic Applied: Other: lidocaine %, Treatment Notes Wound #2 (Left, Circumferential Lower Leg) 1. Cleansed with: Cleanse wound with antibacterial soap and water 2. Anesthetic Topical Lidocaine 4% cream to wound bed prior to debridement 3. Peri-wound Care: Barrier cream 4. Dressing Applied: Aquacel Ag 5. Secondary Dressing Applied ABD Pad 7. Secured with Tape 3 Layer Compression System - Bilateral Notes xtrasorb Electronic Signature(s) Signed: 05/07/2016 4:37:51 PM By: Alejandro Mulling Entered By: Alejandro Mulling on 05/07/2016 09:59:51 [name], [name] ([id number]) -------------------------------------------------------------------------------- Wound Assessment Details Patient Name: [name] Route Date of Service: 05/07/2016 9:30 AM Medical Record Number: [id number] Patient Account Number: [id number] Date of Birth/Sex:  April 08, 1961 (55 y.o. Male) Treating RN: Phillis Haggis Primary Care Physician: Corky Downs Other Clinician: Referring Physician: Corky Downs Treating Physician/Extender: Rudene Re in Treatment: 2 Wound Status Wound Number: 3 Primary Etiology: Vasculitis Wound Location: Left Foot - Circumfernential Wound Status: Open Wounding Event: Gradually Appeared Comorbid History: Arrhythmia, Hypertension Date Acquired: 12/14/2015 Weeks Of Treatment: 2 Clustered Wound: No Photos Photo Uploaded By: Alejandro Mulling on 05/07/2016 16:26:25 Wound Measurements Length: (cm) 10 Width: (cm) 28 Depth: (cm) 0.1 Area: (cm) 219.911 Volume: (cm) 21.991 % Reduction in Area: -15.2% % Reduction in Volume: -15.2% Epithelialization: None Tunneling: No Undermining: No Wound Description Classification: Partial Thickness Wound Margin: Flat and Intact Exudate Amount: Large Exudate Type: Serous Exudate Color: amber Foul Odor After Cleansing: No Wound Bed Granulation Amount: Large (67-100%) Exposed Structure Granulation Quality: Red, Pink Fascia Exposed: No Necrotic Amount: None Present (0%) Fat Layer Exposed: No Tendon Exposed: No Zagal, Zhamir J. ([id number]) Muscle Exposed: No Joint Exposed: No Bone Exposed: No Limited  to Skin Breakdown Periwound Skin Texture Texture Color No Abnormalities Noted: No No Abnormalities Noted: No Localized Edema: Yes Erythema: Yes Erythema Location: Circumferential Moisture No Abnormalities Noted: No Temperature / Pain Maceration: Yes Temperature: No Abnormality Moist: Yes Tenderness on Palpation: Yes Wound Preparation Ulcer Cleansing: Other: soap and water, Topical Anesthetic Applied: None Treatment Notes Wound #3 (Left, Circumferential Foot) 1. Cleansed with: Cleanse wound with antibacterial soap and water 2. Anesthetic Topical Lidocaine 4% cream to wound bed prior to debridement 3. Peri-wound Care: Barrier cream 4. Dressing  Applied: Aquacel Ag 5. Secondary Dressing Applied ABD Pad 7. Secured with Tape 3 Layer Compression System - Bilateral Notes xtrasorb Electronic Signature(s) Signed: 05/07/2016 4:37:51 PM By: Alejandro Mulling Entered By: Alejandro Mulling on 05/07/2016 10:00:27 [name], [name] ([id number]) -------------------------------------------------------------------------------- Wound Assessment Details Patient Name: [name] Route Date of Service: 05/07/2016 9:30 AM Medical Record Number: [id number] Patient Account Number: [id number] Date of Birth/Sex: 10/13/1961 (55 y.o. Male) Treating RN: Phillis Haggis Primary Care Physician: Corky Downs Other Clinician: Referring Physician: Corky Downs Treating Physician/Extender: Rudene Re in Treatment: 2 Wound Status Wound Number: 4 Primary Etiology: Vasculitis Wound Location: Right Foot - Circumfernential Wound Status: Open Wounding Event: Gradually Appeared Comorbid History: Arrhythmia, Hypertension Date Acquired: 12/14/2015 Weeks Of Treatment: 2 Clustered Wound: No Photos Photo Uploaded By: Alejandro Mulling on 05/07/2016 16:26:25 Wound Measurements Length: (cm) 11 Width: (cm) 28 Depth: (cm) 0.1 Area: (cm) 241.903 Volume: (cm) 24.19 % Reduction in Area: -10% % Reduction in Volume: -10% Epithelialization: None Tunneling: No Undermining: No Wound Description Classification: Partial Thickness Wound Margin: Flat and Intact Exudate Amount: Large Exudate Type: Serous Exudate Color: amber Foul Odor After Cleansing: No Wound Bed Granulation Amount: Large (67-100%) Exposed Structure Granulation Quality: Red, Pink Fascia Exposed: No Necrotic Amount: None Present (0%) Fat Layer Exposed: No Tendon Exposed: No [name], [name] J. ([id number]) Muscle Exposed: No Joint Exposed: No Bone Exposed: No Limited to Skin Breakdown Periwound Skin Texture Texture Color No Abnormalities Noted: No No Abnormalities Noted:  No Localized Edema: Yes Erythema: Yes Erythema Location: Circumferential Moisture No Abnormalities Noted: No Temperature / Pain Maceration: Yes Temperature: No Abnormality Moist: Yes Tenderness on Palpation: Yes Wound Preparation Ulcer Cleansing: Other: soap and water, Topical Anesthetic Applied: None Treatment Notes Wound #4 (Right, Circumferential Foot) 1. Cleansed with: Cleanse wound with antibacterial soap and water 2. Anesthetic Topical Lidocaine 4% cream to wound bed prior to debridement 3. Peri-wound Care: Barrier cream 4. Dressing Applied: Aquacel Ag 5. Secondary Dressing Applied ABD Pad 7. Secured with Tape 3 Layer Compression System - Bilateral Notes xtrasorb Electronic Signature(s) Signed: 05/07/2016 4:37:51 PM By: Alejandro Mulling Entered By: Alejandro Mulling on 05/07/2016 10:01:02 Haglund, [name] ([id number]) -------------------------------------------------------------------------------- Vitals Details Patient Name: [name] Route Date of Service: 05/07/2016 9:30 AM Medical Record Number: [id number] Patient Account Number: [id number] Date of Birth/Sex: December 09, 1961 (55 y.o. Male) Treating RN: Phillis Haggis Primary Care Physician: Corky Downs Other Clinician: Referring Physician: Corky Downs Treating Physician/Extender: Rudene Re in Treatment: 2 Vital Signs Time Taken: 09:34 Temperature (F): 98.2 Height (in): 76 Pulse (bpm): 78 Weight (lbs): 320 Respiratory Rate (breaths/min): 20 Body Mass Index (BMI): 38.9 Blood Pressure (mmHg): 146/105 Reference Range: 80 - 120 mg / dl Electronic Signature(s) Signed: 05/07/2016 4:37:51 PM By: Alejandro Mulling Entered By: Alejandro Mulling on 05/07/2016 09:34:53

## 2016-05-08 NOTE — Progress Notes (Signed)
[name] ([id number]) Visit Report for 05/07/2016 Chief Complaint Document Details Patient Name: [name], [name] Date of Service: 05/07/2016 9:30 AM Medical Record Number: [id number] Patient Account Number: [id number] Date of Birth/Sex: [birth year] (55 y.o. Male) Treating RN: Phillis Haggis Primary Care Physician: Corky Downs Other Clinician: Referring Physician: Corky Downs Treating Physician/Extender: Rudene Re in Treatment: 2 Information Obtained from: Patient Chief Complaint Patient presents to the wound care center for a consult due non healing wound to both lower extremities and accompanied by swelling and this has been worse for the last 5 months. He says the swelling of both lower extremity has been there for at least 6-7 years. Electronic Signature(s) Signed: 05/07/2016 10:21:35 AM By: Evlyn Kanner MD, FACS Entered By: Evlyn Kanner on 05/07/2016 10:21:35 Beveridge, [name] ([id number]) -------------------------------------------------------------------------------- HPI Details Patient Name: [name] Date of Service: 05/07/2016 9:30 AM Medical Record Number: [id number] Patient Account Number: [id number] Date of Birth/Sex: [birth year] (55 y.o. Male) Treating RN: Phillis Haggis Primary Care Physician: Corky Downs Other Clinician: Referring Physician: Corky Downs Treating Physician/Extender: Rudene Re in Treatment: 2 History of Present Illness Location: ulceration and weeping of both lower extremities left more than the right Quality: Patient reports experiencing heaviness to affected area(s). Severity: Patient states wound are getting worse. Duration: Patient has had the wound for > 5 months prior to seeking treatment at the wound center Timing: Pain in wound is Intermittent (comes and goes Context: The wound would happen gradually Modifying Factors: Other treatment(s) tried include:has been having weekly wraps applied to  both lower extremities at Dr. Zannie Kehr office Associated Signs and Symptoms: Patient reports having increase swelling. HPI Description: 54 year old gentleman who has been treated in the past for venous ulcers of the lower extremity is also known to be a smoker for the last 20 years and smokes about a pack of cigarettes a day. He has been seen by Dr. Evette Cristal who has been treating left lower extremity venous ulcer with an Unna boot and have consulted vascular surgery to be seen by Dr. Gilda Crease. the patient was also recently admitted to the hospital on 04/17/2016 and discharged on 04/19/2016 with bleeding, hyponatremia, cellulitis of the right leg, laceration of the right foot. 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 He was admitted to hospital with significant bleeding from the ulcer and initially started on IV antibiotics for underlying cellulitis which was suspected. Patient was seen by Dr. Evie Lacks of surgery and Dr Alberteen Spindle of podiatry. Dr. Graciela Husbands had done excisional debridement of some of the superficial skin slough as well as the ulcerative area on the posterior aspect of the left ankle region. Unna's boots was applied. He was asked to follow-up at the wound center. He is on Xarelto for his chronic atrial fibrillation. 04/30/2016 -- the patient was seen by the PA at the vein and vascular practice who did a consultation but did not have any investigations ordered or done. The patient continues to have a lot of oozing from his wounds. Electronic Signature(s) Signed: 05/07/2016 10:22:00 AM By: Evlyn Kanner MD, FACS Entered By: Evlyn Kanner on 05/07/2016 10:22:00 Zobrist, [name] ([id number]) -------------------------------------------------------------------------------- Physical Exam Details Patient Name: [name] Date of Service: 05/07/2016 9:30 AM Medical Record Number: [id number] Patient  Account Number: [id number] Date of Birth/Sex: 08-Apr-1961 (55 y.o. Male) Treating RN: Phillis Haggis Primary Care Physician: Corky Downs Other Clinician: Referring Physician: Corky Downs Treating Physician/Extender: Rudene Re in Treatment: 2  Constitutional . Pulse regular. Respirations normal and unlabored. Afebrile. . Eyes Nonicteric. Reactive to light. Ears, Nose, Mouth, and Throat Lips, teeth, and gums WNL.Marland Kitchen Moist mucosa without lesions. Neck supple and nontender. No palpable supraclavicular or cervical adenopathy. Normal sized without goiter. Respiratory WNL. No retractions.. Cardiovascular Pedal Pulses WNL. No clubbing, cyanosis or edema. Lymphatic No adneopathy. No adenopathy. No adenopathy. Musculoskeletal Adexa without tenderness or enlargement.. Digits and nails w/o clubbing, cyanosis, infection, petechiae, ischemia, or inflammatory conditions.. Integumentary (Hair, Skin) No suspicious lesions. No crepitus or fluctuance. No peri-wound warmth or erythema. No masses.Marland Kitchen Psychiatric Judgement and insight Intact.. No evidence of depression, anxiety, or agitation.. Notes the patient continues to have a lot of lymphedema which is a stage II lymphedema with fibrotic changes and pitting edema and some open ulcerations. Due to the lack of changes of his dressing he has also got some maceration bilaterally. Electronic Signature(s) Signed: 05/07/2016 10:23:08 AM By: Evlyn Kanner MD, FACS Entered By: Evlyn Kanner on 05/07/2016 10:23:07 Mehl, [name] ([id number]) -------------------------------------------------------------------------------- Physician Orders Details Patient Name: [name] Date of Service: 05/07/2016 9:30 AM Medical Record Number: [id number] Patient Account Number: [id number] Date of Birth/Sex: 06-08-1961 (55 y.o. Male) Treating RN: Phillis Haggis Primary Care Physician: Corky Downs Other Clinician: Referring Physician: Corky Downs Treating Physician/Extender: Rudene Re in Treatment: 2 Verbal / Phone Orders: Yes ClinicianAshok Cordia, Debi Read Back and Verified: Yes Diagnosis Coding Wound Cleansing Wound #1 Right,Circumferential Lower Leg o Cleanse wound with mild soap and water - when changing the wrap Wound #2 Left,Circumferential Lower Leg o Cleanse wound with mild soap and water - when changing the wrap Wound #3 Left,Circumferential Foot o Cleanse wound with mild soap and water - when changing the wrap Wound #4 Right,Circumferential Foot o Cleanse wound with mild soap and water - when changing the wrap Anesthetic Wound #3 Left,Circumferential Foot o Topical Lidocaine 4% cream applied to wound bed prior to debridement - for office use only Skin Barriers/Peri-Wound Care Wound #1 Right,Circumferential Lower Leg o Barrier cream Wound #2 Left,Circumferential Lower Leg o Barrier cream Wound #3 Left,Circumferential Foot o Barrier cream Wound #4 Right,Circumferential Foot o Barrier cream Primary Wound Dressing Wound #1 Right,Circumferential Lower Leg o Aquacel Ag Wound #2 Left,Circumferential Lower Leg o Aquacel Ag Flegel, Samule J. ([id number]) Wound #3 Left,Circumferential Foot o Aquacel Ag Wound #4 Right,Circumferential Foot o Aquacel Ag Secondary Dressing Wound #1 Right,Circumferential Lower Leg o ABD pad o XtraSorb Wound #2 Left,Circumferential Lower Leg o ABD pad o XtraSorb Wound #3 Left,Circumferential Foot o ABD pad o XtraSorb Wound #4 Right,Circumferential Foot o ABD pad o XtraSorb Dressing Change Frequency Wound #1 Right,Circumferential Lower Leg o Three times weekly - Home Health to change wraps Monday and Wednesday Pt is seen in clinic on Friday and is changed then Wound #2 Left,Circumferential Lower Leg o Three times weekly - Home Health to change wraps Monday and Wednesday Pt is seen in clinic on Friday and is  changed then Wound #3 Left,Circumferential Foot o Three times weekly - Home Health to change wraps Monday and Wednesday Pt is seen in clinic on Friday and is changed then Wound #4 Right,Circumferential Foot o Three times weekly - Home Health to change wraps Monday and Wednesday Pt is seen in clinic on Friday and is changed then Follow-up Appointments Wound #1 Right,Circumferential Lower Leg o Return Appointment in 1 week. Wound #2 Left,Circumferential Lower Leg o Return Appointment in 1 week. EWALD, BEG ([id number]) Wound #3 Left,Circumferential Foot o Return Appointment  in 1 week. Wound #4 Right,Circumferential Foot o Return Appointment in 1 week. Edema Control Wound #1 Right,Circumferential Lower Leg o 3 Layer Compression System - Bilateral - Home Health to change Monday and Wednesday o Elevate legs to the level of the heart and pump ankles as often as possible Wound #2 Left,Circumferential Lower Leg o 3 Layer Compression System - Bilateral - Home Health to change Monday and Wednesday o Elevate legs to the level of the heart and pump ankles as often as possible Wound #3 Left,Circumferential Foot o 3 Layer Compression System - Bilateral - Home Health to change Monday and Wednesday o Elevate legs to the level of the heart and pump ankles as often as possible Wound #4 Right,Circumferential Foot o 3 Layer Compression System - Bilateral - Home Health to change Monday and Wednesday o Elevate legs to the level of the heart and pump ankles as often as possible Additional Orders / Instructions Wound #1 Right,Circumferential Lower Leg o Stop Smoking o Increase protein intake. Wound #2 Left,Circumferential Lower Leg o Stop Smoking o Increase protein intake. Wound #3 Left,Circumferential Foot o Stop Smoking o Increase protein intake. Wound #4 Right,Circumferential Foot o Stop Smoking o Increase protein intake. Home Health Wound #1  Right,Circumferential Lower Leg o Continue [facility] Health Visits - [facility] ******Home Health to change wraps MONDAY and WEDNESDAY Pt is seen in clinic on Friday and is changed then****** o Home Health Nurse may visit PRN to address patientos wound care needs. [place], [name] ([id number]) o FACE TO FACE ENCOUNTER: MEDICARE and MEDICAID PATIENTS: I certify that this patient is under my care and that I had a face-to-face encounter that meets the physician face-to-face encounter requirements with this patient on this date. The encounter with the patient was in whole or in part for the following MEDICAL CONDITION: (primary reason for Home Healthcare) MEDICAL NECESSITY: I certify, that based on my findings, NURSING services are a medically necessary home health service. HOME BOUND STATUS: I certify that my clinical findings support that this patient is homebound (i.e., Due to illness or injury, pt requires aid of supportive devices such as crutches, cane, wheelchairs, walkers, the use of special transportation or the assistance of another person to leave their place of residence. There is a normal inability to leave the home and doing so requires considerable and taxing effort. Other absences are for medical reasons / religious services and are infrequent or of short duration when for other reasons). o If current dressing causes regression in wound condition, may D/C ordered dressing product/s and apply Normal Saline Moist Dressing daily until next Wound Healing Center / Other MD appointment. Notify Wound Healing Center of regression in wound condition at [phone]. o Please direct any NON-WOUND related issues/requests for orders to patient's Primary Care Physician Wound #2 Left,Circumferential Lower Leg o Continue Home Health Visits - [facility] ******Home Health to change wraps MONDAY and WEDNESDAY Pt is seen in clinic on Friday and is changed then****** o Home Health Nurse may  visit PRN to address patientos wound care needs. o FACE TO FACE ENCOUNTER: MEDICARE and MEDICAID PATIENTS: I certify that this patient is under my care and that I had a face-to-face encounter that meets the physician face-to-face encounter requirements with this patient on this date. The encounter with the patient was in whole or in part for the following MEDICAL CONDITION: (primary reason for Home Healthcare) MEDICAL NECESSITY: I certify, that based on my findings, NURSING services are a medically necessary home health service. HOME  BOUND STATUS: I certify that my clinical findings support that this patient is homebound (i.e., Due to illness or injury, pt requires aid of supportive devices such as crutches, cane, wheelchairs, walkers, the use of special transportation or the assistance of another person to leave their place of residence. There is a normal inability to leave the home and doing so requires considerable and taxing effort. Other absences are for medical reasons / religious services and are infrequent or of short duration when for other reasons). o If current dressing causes regression in wound condition, may D/C ordered dressing product/s and apply Normal Saline Moist Dressing daily until next Wound Healing Center / Other MD appointment. Notify Wound Healing Center of regression in wound condition at [phone]. o Please direct any NON-WOUND related issues/requests for orders to patient's Primary Care Physician Wound #3 Left,Circumferential Foot o Continue [facility] Health Visits - [facility] ******Home Health to change wraps MONDAY and WEDNESDAY Pt is seen in clinic on Friday and is changed then****** o Home Health Nurse may visit PRN to address patientos wound care needs. o FACE TO FACE ENCOUNTER: MEDICARE and MEDICAID PATIENTS: I certify that this patient is under my care and that I had a face-to-face encounter that meets the physician face-to-face encounter  requirements with this patient on this date. The encounter with the patient was in whole or in part for the following MEDICAL CONDITION: (primary reason for Home Healthcare) Coffie, WELTON BORD ([id number]) MEDICAL NECESSITY: I certify, that based on my findings, NURSING services are a medically necessary home health service. HOME BOUND STATUS: I certify that my clinical findings support that this patient is homebound (i.e., Due to illness or injury, pt requires aid of supportive devices such as crutches, cane, wheelchairs, walkers, the use of special transportation or the assistance of another person to leave their place of residence. There is a normal inability to leave the home and doing so requires considerable and taxing effort. Other absences are for medical reasons / religious services and are infrequent or of short duration when for other reasons). o If current dressing causes regression in wound condition, may D/C ordered dressing product/s and apply Normal Saline Moist Dressing daily until next Wound Healing Center / Other MD appointment. Notify Wound Healing Center of regression in wound condition at [phone]. o Please direct any NON-WOUND related issues/requests for orders to patient's Primary Care Physician Wound #4 Right,Circumferential Foot o Continue [facility] Visits - [facility] ******Home Health to change wraps MONDAY and WEDNESDAY Pt is seen in clinic on Friday and is changed then****** o Home Health Nurse may visit PRN to address patientos wound care needs. o FACE TO FACE ENCOUNTER: MEDICARE and MEDICAID PATIENTS: I certify that this patient is under my care and that I had a face-to-face encounter that meets the physician face-to-face encounter requirements with this patient on this date. The encounter with the patient was in whole or in part for the following MEDICAL CONDITION: (primary reason for Home Healthcare) MEDICAL NECESSITY: I certify, that based on  my findings, NURSING services are a medically necessary home health service. HOME BOUND STATUS: I certify that my clinical findings support that this patient is homebound (i.e., Due to illness or injury, pt requires aid of supportive devices such as crutches, cane, wheelchairs, walkers, the use of special transportation or the assistance of another person to leave their place of residence. There is a normal inability to leave the home and doing so requires considerable and taxing effort. Other absences are for  medical reasons / religious services and are infrequent or of short duration when for other reasons). o If current dressing causes regression in wound condition, may D/C ordered dressing product/s and apply Normal Saline Moist Dressing daily until next Wound Healing Center / Other MD appointment. Notify Wound Healing Center of regression in wound condition at [phone]. o Please direct any NON-WOUND related issues/requests for orders to patient's Primary Care Physician Electronic Signature(s) Signed: 05/07/2016 4:01:52 PM By: Evlyn KannerBritto, Dickey Caamano MD, FACS Signed: 05/07/2016 4:37:51 PM By: Alejandro MullingPinkerton, Debra Entered By: Alejandro MullingPinkerton, Debra on 05/07/2016 10:12:23 Wolaver, [name] ([id number]) -------------------------------------------------------------------------------- Problem List Details Patient Name: [name] RouteAMERON, Saunders J. Date of Service: 05/07/2016 9:30 AM Medical Record Number: [id number] Patient Account Number: [id number] Date of Birth/Sex: [birth year](54 y.o. 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 ([id number]) -------------------------------------------------------------------------------- Progress Note Details Patient Name: [name] RouteAMERON, Vence J. Date of Service: 05/07/2016 9:30 AM Medical Record Number: [id number] Patient Account Number: [id number] Date of Birth/Sex: [birth year] [age] y.o. Male) Treating RN: Phillis HaggisPinkerton, Debi Primary Care Physician: Corky DownsMASOUD, [name] Other Clinician: Referring Physician: Corky DownsMASOUD, [name] Treating Physician/Extender: Rudene ReBritto, Tremar Wickens Weeks in Treatment: 2 Subjective Chief Complaint Information obtained from Patient Patient presents to the wound care center for a consult due non healing wound to both lower extremities and accompanied by swelling and this has been worse for the last 5 months. He says the swelling of both lower extremity has been there for at least 6-7 years. History of Present Illness (HPI) The following HPI elements were documented for the patient's wound: Location: ulceration and weeping of both lower extremities left more than the right Quality: Patient reports experiencing heaviness to affected area(s). Severity: Patient states wound are getting worse. Duration: Patient has had the wound for > 5 months prior to seeking treatment at the wound center Timing: Pain in wound is Intermittent (comes and goes Context: The wound would happen gradually Modifying Factors: Other treatment(s) tried include:has been having weekly wraps applied to both lower extremities at Dr. Zannie KehrShankar's office Associated Signs and Symptoms: Patient reports having increase  swelling. 55 year old gentleman who has been treated in the past for venous ulcers of the lower extremity is also known to be a smoker for the last 20 years and smokes about a pack of cigarettes a day. He has been seen by Dr. Evette CristalSankar who has been treating left lower extremity venous ulcer with an Unna boot and have consulted vascular surgery to be seen by Dr. Gilda CreaseSchnier. the patient was also recently admitted to the hospital on 04/17/2016 and discharged on 04/19/2016 with bleeding, hyponatremia, cellulitis of the right leg, laceration of the right foot. 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 He was admitted to hospital with significant bleeding from the ulcer and initially started on IV antibiotics for underlying cellulitis which was suspected. Patient was seen by Dr. Evie LacksEsco of surgery and Dr Alberteen Spindleline of podiatry. Dr. Graciela HusbandsKlein had done excisional  debridement of some of the superficial skin slough as well as the ulcerative area on the posterior aspect of the left ankle region. Unna's boots was applied. He was asked to follow-up at the wound center. He is on Xarelto for his chronic atrial fibrillation. 04/30/2016 -- the patient was seen by the PA at the vein and vascular practice who did a consultation but did not have any investigations ordered or done. The patient continues to have a lot of oozing from his wounds. [place]field, [name] ([id number]) Objective Constitutional Pulse regular. Respirations normal and unlabored. Afebrile. Vitals Time Taken: 9:34 AM, Height: 76 in, Weight: 320 lbs, BMI: 38.9, Temperature: 98.2 F, Pulse: 78 bpm, Respiratory Rate: 20 breaths/min, Blood Pressure: 146/105 mmHg. Eyes Nonicteric. Reactive to light. Ears, Nose, Mouth, and Throat Lips, teeth, and gums WNL.Marland Kitchen Moist mucosa without lesions. Neck supple and nontender. No palpable supraclavicular or cervical adenopathy. Normal sized  without goiter. Respiratory WNL. No retractions.. Cardiovascular Pedal Pulses WNL. No clubbing, cyanosis or edema. Lymphatic No adneopathy. No adenopathy. No adenopathy. Musculoskeletal Adexa without tenderness or enlargement.. Digits and nails w/o clubbing, cyanosis, infection, petechiae, ischemia, or inflammatory conditions.Marland Kitchen Psychiatric Judgement and insight Intact.. No evidence of depression, anxiety, or agitation.. General Notes: the patient continues to have a lot of lymphedema which is a stage II lymphedema with fibrotic changes and pitting edema and some open ulcerations. Due to the lack of changes of his dressing he has also got some maceration bilaterally. Integumentary (Hair, Skin) No suspicious lesions. No crepitus or fluctuance. No peri-wound warmth or erythema. No masses.. Wound #1 status is Open. Original cause of wound was Gradually Appeared. The wound is located on the Right,Circumferential Lower Leg. The wound measures 18cm length x 27cm width x 0.1cm depth; 381.704cm^2 area and 38.17cm^3 volume. The wound is limited to skin breakdown. There is no tunneling or undermining noted. There is a large amount of serous drainage noted. The wound margin is flat and intact. There is large (67-100%) red granulation within the wound bed. There is a small (1-33%) amount of Goguen, Angelica J. ([id number]) necrotic tissue within the wound bed including Adherent Slough. The periwound skin appearance exhibited: Localized Edema, Maceration, Moist, Erythema. The surrounding wound skin color is noted with erythema which is circumferential. Periwound temperature was noted as No Abnormality. The periwound has tenderness on palpation. Wound #2 status is Open. Original cause of wound was Gradually Appeared. The wound is located on the Left,Circumferential Lower Leg. The wound measures 20cm length x 32cm width x 0.2cm depth; 502.655cm^2 area and 100.531cm^3 volume. The wound is limited to skin  breakdown. There is no tunneling or undermining noted. There is a large amount of serous drainage noted. The wound margin is flat and intact. There is large (67-100%) red, pink granulation within the wound bed. There is a small (1-33%) amount of necrotic tissue within the wound bed including Adherent Slough. The periwound skin appearance exhibited: Localized Edema, Erythema. The surrounding wound skin color is noted with erythema which is circumferential. Periwound temperature was noted as No Abnormality. The periwound has tenderness on palpation. Wound #3 status is Open. Original cause of wound was Gradually Appeared. The wound is located on the Left,Circumferential Foot. The wound measures 10cm length x 28cm width x 0.1cm depth; 219.911cm^2 area and 21.991cm^3 volume. The wound is limited to skin breakdown. There is no tunneling or undermining noted. There is a large amount of serous drainage noted. The wound margin is flat and intact. There is large (67-100%) red,  pink granulation within the wound bed. There is no necrotic tissue within the wound bed. The periwound skin appearance exhibited: Localized Edema, Maceration, Moist, Erythema. The surrounding wound skin color is noted with erythema which is circumferential. Periwound temperature was noted as No Abnormality. The periwound has tenderness on palpation. Wound #4 status is Open. Original cause of wound was Gradually Appeared. The wound is located on the Right,Circumferential Foot. The wound measures 11cm length x 28cm width x 0.1cm depth; 241.903cm^2 area and 24.19cm^3 volume. The wound is limited to skin breakdown. There is no tunneling or undermining noted. There is a large amount of serous drainage noted. The wound margin is flat and intact. There is large (67-100%) red, pink granulation within the wound bed. There is no necrotic tissue within the wound bed. The periwound skin appearance exhibited: Localized Edema, Maceration, Moist,  Erythema. The surrounding wound skin color is noted with erythema which is circumferential. Periwound temperature was noted as No Abnormality. The periwound has tenderness on palpation. 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 ([id number]) Plan Wound Cleansing: Wound #1 Right,Circumferential Lower Leg: Cleanse wound with mild soap and water - when changing the wrap Wound #2 Left,Circumferential Lower Leg: Cleanse wound with mild soap and water - when changing the wrap Wound #3 Left,Circumferential Foot: Cleanse wound with mild soap and water - when changing the wrap Wound #4 Right,Circumferential Foot: Cleanse wound with mild soap and water - when changing the wrap Anesthetic: Wound #3 Left,Circumferential Foot: Topical Lidocaine 4% cream applied to wound bed prior to debridement - for office use only Skin Barriers/Peri-Wound Care: Wound #1 Right,Circumferential Lower Leg: Barrier cream Wound #2 Left,Circumferential Lower Leg: Barrier cream Wound #3 Left,Circumferential Foot: Barrier cream Wound #4 Right,Circumferential Foot: Barrier cream Primary Wound Dressing: Wound #1 Right,Circumferential Lower Leg: Aquacel Ag Wound #2 Left,Circumferential Lower Leg: Aquacel Ag Wound #3 Left,Circumferential Foot: Aquacel Ag Wound #4 Right,Circumferential Foot: Aquacel Ag Secondary Dressing: Wound #1 Right,Circumferential Lower Leg: ABD pad XtraSorb Wound #2 Left,Circumferential Lower Leg: ABD pad XtraSorb Wound #3 Left,Circumferential Foot: ABD pad XtraSorb Wound #4 Right,Circumferential Foot: ABD pad XtraSorb Dressing Change Frequency: Wound #1 Right,Circumferential  Lower Leg: Three times weekly - Home Health to change wraps Monday and Wednesday Pt is seen in clinic on Friday and is changed then Wound #2 Left,Circumferential Lower Leg: Haven, Griffon J. ([id number]) Three times weekly - Home Health to change wraps Monday and Wednesday Pt is seen in clinic on Friday and is changed then Wound #3 Left,Circumferential Foot: Three times weekly - Home Health to change wraps Monday and Wednesday Pt is seen in clinic on Friday and is changed then Wound #4 Right,Circumferential Foot: Three times weekly - Home Health to change wraps Monday and Wednesday Pt is seen in clinic on Friday and is changed then Follow-up Appointments: Wound #1 Right,Circumferential Lower Leg: Return Appointment in 1 week. Wound #2 Left,Circumferential Lower Leg: Return Appointment in 1 week. Wound #3 Left,Circumferential Foot: Return Appointment in 1 week. Wound #4 Right,Circumferential Foot: Return Appointment in 1 week. Edema Control: Wound #1 Right,Circumferential Lower Leg: 3 Layer Compression System - Bilateral - Home Health to change Monday and Wednesday Elevate legs to the level of the heart and pump ankles as often as possible Wound #2 Left,Circumferential Lower Leg: 3 Layer  Compression System - Bilateral - Home Health to change Monday and Wednesday Elevate legs to the level of the heart and pump ankles as often as possible Wound #3 Left,Circumferential Foot: 3 Layer Compression System - Bilateral - Home Health to change Monday and Wednesday Elevate legs to the level of the heart and pump ankles as often as possible Wound #4 Right,Circumferential Foot: 3 Layer Compression System - Bilateral - Home Health to change Monday and Wednesday Elevate legs to the level of the heart and pump ankles as often as possible Additional Orders / Instructions: Wound #1 Right,Circumferential Lower Leg: Stop Smoking Increase protein intake. Wound #2 Left,Circumferential Lower  Leg: Stop Smoking Increase protein intake. Wound #3 Left,Circumferential Foot: Stop Smoking Increase protein intake. Wound #4 Right,Circumferential Foot: Stop Smoking Increase protein intake. Home Health: Wound #1 Right,Circumferential Lower Leg: Continue Home Health Visits - [facility] ******Home Health to change wraps MONDAY and WEDNESDAY Pt is seen in clinic on Friday and is changed then****** Home Health Nurse may visit PRN to address patient s wound care needs. FACE TO FACE ENCOUNTER: MEDICARE and MEDICAID PATIENTS: I certify that this patient is under my care and that I had a face-to-face encounter that meets the physician face-to-face encounter requirements with this patient on this date. The encounter with the patient was in whole or in part for the [facility]. ([id number]) following MEDICAL CONDITION: (primary reason for Home Healthcare) MEDICAL NECESSITY: I certify, that based on my findings, NURSING services are a medically necessary home health service. HOME BOUND STATUS: I certify that my clinical findings support that this patient is homebound (i.e., Due to illness or injury, pt requires aid of supportive devices such as crutches, cane, wheelchairs, walkers, the use of special transportation or the assistance of another person to leave their place of residence. There is a normal inability to leave the home and doing so requires considerable and taxing effort. Other absences are for medical reasons / religious services and are infrequent or of short duration when for other reasons). If current dressing causes regression in wound condition, may D/C ordered dressing product/s and apply Normal Saline Moist Dressing daily until next Wound Healing Center / Other MD appointment. Notify Wound Healing Center of regression in wound condition at [phone]. Please direct any NON-WOUND related issues/requests for orders to patient's Primary Care Physician Wound #2  Left,Circumferential Lower Leg: Continue Home Health Visits - [facility] ******Home Health to change wraps MONDAY and WEDNESDAY Pt is seen in clinic on Friday and is changed then****** Home Health Nurse may visit PRN to address patient s wound care needs. FACE TO FACE ENCOUNTER: MEDICARE and MEDICAID PATIENTS: I certify that this patient is under my care and that I had a face-to-face encounter that meets the physician face-to-face encounter requirements with this patient on this date. The encounter with the patient was in whole or in part for the following MEDICAL CONDITION: (primary reason for Home Healthcare) MEDICAL NECESSITY: I certify, that based on my findings, NURSING services are a medically necessary home health service. HOME BOUND STATUS: I certify that my clinical findings support that this patient is homebound (i.e., Due to illness or injury, pt requires aid of supportive devices such as crutches, cane, wheelchairs, walkers, the use of special transportation or the assistance of another person to leave their place of residence. There is a normal inability to leave the home and doing so requires considerable and taxing effort. Other absences are for medical reasons / religious services and are infrequent  or of short duration when for other reasons). If current dressing causes regression in wound condition, may D/C ordered dressing product/s and apply Normal Saline Moist Dressing daily until next Wound Healing Center / Other MD appointment. Notify Wound Healing Center of regression in wound condition at [phone]. Please direct any NON-WOUND related issues/requests for orders to patient's Primary Care Physician Wound #3 Left,Circumferential Foot: Continue Home Health Visits - [facility] ******Home Health to change wraps MONDAY and WEDNESDAY Pt is seen in clinic on Friday and is changed then****** Home Health Nurse may visit PRN to address patient s wound care needs. FACE TO FACE  ENCOUNTER: MEDICARE and MEDICAID PATIENTS: I certify that this patient is under my care and that I had a face-to-face encounter that meets the physician face-to-face encounter requirements with this patient on this date. The encounter with the patient was in whole or in part for the following MEDICAL CONDITION: (primary reason for Home Healthcare) MEDICAL NECESSITY: I certify, that based on my findings, NURSING services are a medically necessary home health service. HOME BOUND STATUS: I certify that my clinical findings support that this patient is homebound (i.e., Due to illness or injury, pt requires aid of supportive devices such as crutches, cane, wheelchairs, walkers, the use of special transportation or the assistance of another person to leave their place of residence. There is a normal inability to leave the home and doing so requires considerable and taxing effort. Other absences are for medical reasons / religious services and are infrequent or of short duration when for other reasons). If current dressing causes regression in wound condition, may D/C ordered dressing product/s and apply Normal Saline Moist Dressing daily until next Wound Healing Center / Other MD appointment. Notify Wound Healing Center of regression in wound condition at [phone]. Please direct any NON-WOUND related issues/requests for orders to patient's Primary Care Physician Wound #4 Right,Circumferential Foot: Continue Home Health Visits - [facility] ******Home Health to change wraps MONDAY and WEDNESDAY Pt is seen in clinic on Friday and is changed then****** Home Health Nurse may visit PRN to address patient s wound care needs. MALEKI, HIPPE ([id number]) FACE TO FACE ENCOUNTER: MEDICARE and MEDICAID PATIENTS: I certify that this patient is under my care and that I had a face-to-face encounter that meets the physician face-to-face encounter requirements with this patient on this date. The encounter with  the patient was in whole or in part for the following MEDICAL CONDITION: (primary reason for Home Healthcare) MEDICAL NECESSITY: I certify, that based on my findings, NURSING services are a medically necessary home health service. HOME BOUND STATUS: I certify that my clinical findings support that this patient is homebound (i.e., Due to illness or injury, pt requires aid of supportive devices such as crutches, cane, wheelchairs, walkers, the use of special transportation or the assistance of another person to leave their place of residence. There is a normal inability to leave the home and doing so requires considerable and taxing effort. Other absences are for medical reasons / religious services and are infrequent or of short duration when for other reasons). If current dressing causes regression in wound condition, may D/C ordered dressing product/s and apply Normal Saline Moist Dressing daily until next Wound Healing Center / Other MD appointment. Notify Wound Healing Center of regression in wound condition at [phone]. Please direct any NON-WOUND related issues/requests for orders to patient's Primary Care Physician I have recommended: 1. Elevation and exercise 2. 3 layer Profore compression wraps with Aquacel on the  wound, to be changed 3 times a week as he has a lot of drainage 3. Spent about 3 minutes discussing with him the need to completely give up smoking and he says he'll be compliant. 4. may wash his legs with soap and water prior to compression wraps been applied. 5. he will be seen back in the wound clinic on a regular basis 6. if he develops further cellulitis or increasing tenderness and redness of his legs he would have to report to the ER for inpatient antibiotics. Electronic Signature(s) Signed: 05/07/2016 10:23:47 AM By: Evlyn Kanner MD, FACS Entered By: Evlyn Kanner on 05/07/2016 10:23:47 Myren, [name]  ([id number]) -------------------------------------------------------------------------------- SuperBill Details Patient Name: [name] Date of Service: 05/07/2016 Medical Record Number: [id number] Patient Account Number: [id number] Date of Birth/Sex: 26-Dec-1960 (55 y.o. 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

## 2016-05-13 ENCOUNTER — Encounter: Payer: Self-pay | Admitting: General Surgery

## 2016-05-14 ENCOUNTER — Encounter: Payer: BC Managed Care – PPO | Attending: Surgery | Admitting: Surgery

## 2016-05-14 DIAGNOSIS — F17218 Nicotine dependence, cigarettes, with other nicotine-induced disorders: Secondary | ICD-10-CM | POA: Insufficient documentation

## 2016-05-14 DIAGNOSIS — I890 Lymphedema, not elsewhere classified: Secondary | ICD-10-CM | POA: Insufficient documentation

## 2016-05-14 DIAGNOSIS — K219 Gastro-esophageal reflux disease without esophagitis: Secondary | ICD-10-CM | POA: Diagnosis not present

## 2016-05-14 DIAGNOSIS — I482 Chronic atrial fibrillation: Secondary | ICD-10-CM | POA: Insufficient documentation

## 2016-05-14 DIAGNOSIS — I87032 Postthrombotic syndrome with ulcer and inflammation of left lower extremity: Secondary | ICD-10-CM | POA: Insufficient documentation

## 2016-05-14 DIAGNOSIS — I87031 Postthrombotic syndrome with ulcer and inflammation of right lower extremity: Secondary | ICD-10-CM | POA: Diagnosis not present

## 2016-05-14 DIAGNOSIS — L97521 Non-pressure chronic ulcer of other part of left foot limited to breakdown of skin: Secondary | ICD-10-CM | POA: Insufficient documentation

## 2016-05-14 DIAGNOSIS — Z6838 Body mass index (BMI) 38.0-38.9, adult: Secondary | ICD-10-CM | POA: Diagnosis not present

## 2016-05-14 DIAGNOSIS — L97811 Non-pressure chronic ulcer of other part of right lower leg limited to breakdown of skin: Secondary | ICD-10-CM | POA: Diagnosis not present

## 2016-05-14 DIAGNOSIS — Z7901 Long term (current) use of anticoagulants: Secondary | ICD-10-CM | POA: Insufficient documentation

## 2016-05-14 DIAGNOSIS — I10 Essential (primary) hypertension: Secondary | ICD-10-CM | POA: Insufficient documentation

## 2016-05-21 ENCOUNTER — Encounter: Payer: BC Managed Care – PPO | Admitting: Surgery

## 2016-05-21 DIAGNOSIS — I890 Lymphedema, not elsewhere classified: Secondary | ICD-10-CM | POA: Diagnosis not present

## 2016-05-22 NOTE — Progress Notes (Signed)
DORMAN, CALDERWOOD ([id number]) Visit Report for 05/21/2016 Arrival Information Details Patient Name: [name], [name] Date of Service: 05/21/2016 2:15 PM Medical Record Number: [id number] Patient Account Number: [id number] Date of Birth/Sex: February 04, 1961 (55 y.o. Male) Treating RN: Phillis Haggis Primary Care Physician: Corky Downs Other Clinician: Referring Physician: Corky Downs Treating Physician/Extender: Rudene Re in Treatment: 4 Visit Information History Since Last Visit All ordered tests and consults were completed: No Patient Arrived: Ambulatory Added or deleted any medications: No Arrival Time: 14:22 Any new allergies or adverse reactions: No Accompanied By: girlfriend Had a fall or experienced change in No Transfer Assistance: None activities of daily living that may affect Patient Identification Verified: Yes risk of falls: Secondary Verification Process Yes Signs or symptoms of abuse/neglect since last No Completed: visito Patient Requires Transmission- No Hospitalized since last visit: No Based Precautions: Pain Present Now: No Patient Has Alerts: Yes Patient Alerts: Patient on Blood Thinner [name] Electronic Signature(s) Signed: 05/21/2016 4:33:34 PM By: Alejandro Mulling Entered By: Alejandro Mulling on 05/21/2016 14:22:43 [name], [name] ([id number]) -------------------------------------------------------------------------------- Encounter Discharge Information Details Patient Name: [name] Date of Service: 05/21/2016 2:15 PM Medical Record Number: [id number] Patient Account Number: [id number] Date of Birth/Sex: [birth year] (55 y.o. Male) Treating RN: Phillis Haggis Primary Care Physician: Corky Downs Other Clinician: Referring Physician: Corky Downs Treating Physician/Extender: Rudene Re in Treatment: 4 Encounter Discharge Information Items Discharge Pain Level: 0 Discharge Condition: Stable Ambulatory Status:  Ambulatory Discharge Destination: Home Transportation: Private Auto Accompanied By: girlfriend Schedule Follow-up Appointment: Yes Medication Reconciliation completed and provided to Patient/Care Yes Keionte Swicegood: Provided on Clinical Summary of Care: 05/21/2016 Form Type Recipient Paper Patient MD Electronic Signature(s) Signed: 05/21/2016 3:31:41 PM By: Gwenlyn Perking Entered By: Gwenlyn Perking on 05/21/2016 15:31:41 Marcy, [name] ([id number]) -------------------------------------------------------------------------------- Lower Extremity Assessment Details Patient Name: [name] Date of Service: 05/21/2016 2:15 PM Medical Record Number: [id number] Patient Account Number: [id number] Date of Birth/Sex: 21-Mar-1961 (55 y.o. Male) Treating RN: Ashok Cordia, Debi Primary Care Physician: Corky Downs Other Clinician: Referring Physician: Corky Downs Treating Physician/Extender: Rudene Re in Treatment: 4 Edema Assessment Assessed: [Left: No] [Right: No] E[Left: dema] [Right: :] Calf Left: Right: Point of Measurement: 38 cm From Medial Instep 46 cm 48 cm Ankle Left: Right: Point of Measurement: 13 cm From Medial Instep 28.5 cm 28 cm Vascular Assessment Pulses: Posterior Tibial Dorsalis Pedis Palpable: [Left:Yes] [Right:Yes] Extremity colors, hair growth, and conditions: Extremity Color: [Left:Red] [Right:Red] Temperature of Extremity: [Left:Warm] [Right:Warm] Capillary Refill: [Left:< 3 seconds] [Right:< 3 seconds] Electronic Signature(s) Signed: 05/21/2016 4:33:34 PM By: Alejandro Mulling Entered By: Alejandro Mulling on 05/21/2016 14:39:13 [name], [name] ([id number]) -------------------------------------------------------------------------------- Multi Wound Chart Details Patient Name: [name] Date of Service: 05/21/2016 2:15 PM Medical Record Number: [id number] Patient Account Number: [id number] Date of Birth/Sex: 01/28/1961 (55 y.o.  Male) Treating RN: Phillis Haggis Primary Care Physician: Corky Downs Other Clinician: Referring Physician: Corky Downs Treating Physician/Extender: Rudene Re in Treatment: 4 Vital Signs Height(in): 76 Pulse(bpm): 68 Weight(lbs): 320 Blood Pressure 152/94 (mmHg): Body Mass Index(BMI): 39 Temperature(F): 98.2 Respiratory Rate 20 (breaths/min): Photos: [1:No Photos] [2:No Photos] [3:No Photos] Wound Location: [1:Right Lower Leg - Circumfernential] [2:Left Lower Leg - Circumfernential] [3:Left Foot - Circumfernential] Wounding Event: [1:Gradually Appeared] [2:Gradually Appeared] [3:Gradually Appeared] Primary Etiology: [1:Venous Leg Ulcer] [2:Venous Leg Ulcer] [3:Vasculitis] Comorbid History: [1:Arrhythmia, Hypertension] [2:Arrhythmia, Hypertension] [3:Arrhythmia, Hypertension] Date Acquired: [1:12/14/2015] [2:12/14/2015] [3:12/14/2015] Weeks of Treatment: [1:4] [2:4] [3:4] Wound Status: [1:Open] [2:Open] [3:Open] Measurements L x W x D  8x8x0.1 [2:14x32x0.2] [3:8x26x0.1] (cm) Area (cm) : [1:50.265] [2:351.858] [3:163.363] Volume (cm) : [1:5.027] [2:70.372] [3:16.336] % Reduction in Area: [1:85.50%] [2:22.90%] [3:14.40%] % Reduction in Volume: 85.50% [2:-54.10%] [3:14.40%] Classification: [1:Partial Thickness] [2:Partial Thickness] [3:Partial Thickness] Exudate Amount: [1:Large] [2:Large] [3:Large] Exudate Type: [1:Serous] [2:Serous] [3:Serous] Exudate Color: [1:amber] [2:amber] [3:amber] Wound Margin: [1:Flat and Intact] [2:Flat and Intact] [3:Flat and Intact] Granulation Amount: [1:Large (67-100%)] [2:Large (67-100%)] [3:Large (67-100%)] Granulation Quality: [1:Red] [2:Red, Pink] [3:Red, Pink] Necrotic Amount: [1:Small (1-33%)] [2:Small (1-33%)] [3:None Present (0%)] Exposed Structures: [1:Fascia: No Fat: No Tendon: No Muscle: No Joint: No Bone: No] [2:Fascia: No Fat: No Tendon: No Muscle: No Joint: No Bone: No] [3:Fascia: No Fat: No Tendon: No Muscle: No Joint:  No Bone: No] Limited to Skin Limited to Skin Limited to Skin Breakdown Breakdown Breakdown Epithelialization: None None None Periwound Skin Texture: Edema: Yes Edema: Yes Edema: Yes Periwound Skin Maceration: Yes No Abnormalities Noted Maceration: Yes Moisture: Moist: Yes Moist: Yes Periwound Skin Color: Erythema: Yes Erythema: Yes Erythema: Yes Erythema Location: Circumferential Circumferential Circumferential Temperature: No Abnormality No Abnormality No Abnormality Tenderness on Yes Yes Yes Palpation: Wound Preparation: Ulcer Cleansing: Other: Ulcer Cleansing: Other: Ulcer Cleansing: Other: soap and water soap and water soap and water Topical Anesthetic Topical Anesthetic Applied: Other: lidocaine Applied: None % Wound Number: 4 N/A N/A Photos: No Photos N/A N/A Wound Location: Right Foot - N/A N/A Circumfernential Wounding Event: Gradually Appeared N/A N/A Primary Etiology: Vasculitis N/A N/A Comorbid History: Arrhythmia, Hypertension N/A N/A Date Acquired: 12/14/2015 N/A N/A Weeks of Treatment: 4 N/A N/A Wound Status: Open N/A N/A Measurements L x W x D 10x26x0.1 N/A N/A (cm) Area (cm) : 204.204 N/A N/A Volume (cm) : 20.42 N/A N/A % Reduction in Area: 7.10% N/A N/A % Reduction in Volume: 7.10% N/A N/A Classification: Partial Thickness N/A N/A Exudate Amount: Large N/A N/A Exudate Type: Serous N/A N/A Exudate Color: amber N/A N/A Wound Margin: Flat and Intact N/A N/A Granulation Amount: Large (67-100%) N/A N/A Granulation Quality: Red, Pink N/A N/A Necrotic Amount: None Present (0%) N/A N/A Exposed Structures: Fascia: No N/A N/A Fat: No Tendon: No Muscle: No Joint: No Bone: No [name], [name] J. ([id number]) Limited to Skin Breakdown Epithelialization: None N/A N/A Periwound Skin Texture: Edema: Yes N/A N/A Periwound Skin Maceration: Yes N/A N/A Moisture: Moist: Yes Periwound Skin Color: Erythema: Yes N/A N/A Erythema Location: Circumferential N/A  N/A Temperature: No Abnormality N/A N/A Tenderness on Yes N/A N/A Palpation: Wound Preparation: Ulcer Cleansing: Other: N/A N/A soap and water Topical Anesthetic Applied: None Treatment Notes Electronic Signature(s) Signed: 05/21/2016 4:33:34 PM By: Alejandro Mulling Entered By: Alejandro Mulling on 05/21/2016 14:44:46 [name], [name] ([id number]) -------------------------------------------------------------------------------- Multi-Disciplinary Care Plan Details Patient Name: [name] Date of Service: 05/21/2016 2:15 PM Medical Record Number: [id number] Patient Account Number: [id number] Date of Birth/Sex: 03-28-1961 (54 y.o. Male) Treating RN: Phillis Haggis Primary Care Physician: Corky Downs Other Clinician: Referring Physician: Corky Downs Treating Physician/Extender: Rudene Re in Treatment: 4 Active Inactive Orientation to the Wound Care Program Nursing Diagnoses: Knowledge deficit related to the wound healing center program Goals: Patient/caregiver will verbalize understanding of the Wound Healing Center Program Date Initiated: 04/23/2016 Goal Status: Active Interventions: Provide education on orientation to the wound center Notes: Pain, Acute or Chronic Nursing Diagnoses: Pain, acute or chronic: actual or potential Potential alteration in comfort, pain Goals: Patient will verbalize adequate pain control and receive pain control interventions during procedures as needed Date Initiated: 04/23/2016 Goal Status: Active Patient/caregiver  will verbalize adequate pain control between visits Date Initiated: 04/23/2016 Goal Status: Active Interventions: Assess comfort goal upon admission Complete pain assessment as per visit requirements Notes: Wound/Skin Impairment [name], [name] ([id number]) Nursing Diagnoses: Impaired tissue integrity Knowledge deficit related to smoking impact on wound healing Knowledge deficit related to  ulceration/compromised skin integrity Goals: Ulcer/skin breakdown will have a volume reduction of 30% by week 4 Date Initiated: 04/23/2016 Goal Status: Active Ulcer/skin breakdown will have a volume reduction of 50% by week 8 Date Initiated: 04/23/2016 Goal Status: Active Ulcer/skin breakdown will have a volume reduction of 80% by week 12 Date Initiated: 04/23/2016 Goal Status: Active Interventions: Assess ulceration(s) every visit Notes: Electronic Signature(s) Signed: 05/21/2016 4:33:34 PM By: Alejandro Mulling Entered By: Alejandro Mulling on 05/21/2016 14:44:24 [name], [name] ([id number]) -------------------------------------------------------------------------------- Pain Assessment Details Patient Name: [name] Date of Service: 05/21/2016 2:15 PM Medical Record Number: [id number] Patient Account Number: [id number] Date of Birth/Sex: February 01, 1961 (55 y.o. Male) Treating RN: Phillis Haggis Primary Care Physician: Corky Downs Other Clinician: Referring Physician: Corky Downs Treating Physician/Extender: Rudene Re in Treatment: 4 Active Problems Location of Pain Severity and Description of Pain Patient Has Paino No Site Locations Pain Management and Medication Current Pain Management: Electronic Signature(s) Signed: 05/21/2016 4:33:34 PM By: Alejandro Mulling Entered By: Alejandro Mulling on 05/21/2016 14:22:48 [name], [name] ([id number]) -------------------------------------------------------------------------------- Patient/Caregiver Education Details Patient Name: [name] Date of Service: 05/21/2016 2:15 PM Medical Record Number: [id number] Patient Account Number: [id number] Date of Birth/Gender: 03-13-1961 (55 y.o. Male) Treating RN: Phillis Haggis Primary Care Physician: Corky Downs Other Clinician: Referring Physician: Corky Downs Treating Physician/Extender: Rudene Re in Treatment: 4 Education Assessment Education  Provided To: Patient Education Topics Provided Wound/Skin Impairment: Handouts: Other: change dressing as ordered and do not get dressings wet Methods: Demonstration, Explain/Verbal Responses: State content correctly Electronic Signature(s) Signed: 05/21/2016 4:33:34 PM By: Alejandro Mulling Entered By: Alejandro Mulling on 05/21/2016 14:52:20 [name], [name] ([id number]) -------------------------------------------------------------------------------- Wound Assessment Details Patient Name: [name] Date of Service: 05/21/2016 2:15 PM Medical Record Number: [id number] Patient Account Number: [id number] Date of Birth/Sex: Jul 22, 1961 (55 y.o. Male) Treating RN: Ashok Cordia, Debi Primary Care Physician: Corky Downs Other Clinician: Referring Physician: Corky Downs Treating Physician/Extender: Rudene Re in Treatment: 4 Wound Status Wound Number: 1 Primary Etiology: Venous Leg Ulcer Wound Location: Right Lower Leg - Wound Status: Open Circumfernential Comorbid History: Arrhythmia, Hypertension Wounding Event: Gradually Appeared Date Acquired: 12/14/2015 Weeks Of Treatment: 4 Clustered Wound: No Wound Measurements Length: (cm) 8 Width: (cm) 8 Depth: (cm) 0.1 Area: (cm) 50.265 Volume: (cm) 5.027 % Reduction in Area: 85.5% % Reduction in Volume: 85.5% Epithelialization: None Tunneling: No Undermining: No Wound Description Classification: Partial Thickness Foul Odor Afte Wound Margin: Flat and Intact Exudate Amount: Large Exudate Type: Serous Exudate Color: amber r Cleansing: No Wound Bed Granulation Amount: Large (67-100%) Exposed Structure Granulation Quality: Red Fascia Exposed: No Necrotic Amount: Small (1-33%) Fat Layer Exposed: No Necrotic Quality: Adherent Slough Tendon Exposed: No Muscle Exposed: No Joint Exposed: No Bone Exposed: No Limited to Skin Breakdown Periwound Skin Texture Texture Color No Abnormalities Noted: No No  Abnormalities Noted: No Localized Edema: Yes Erythema: Yes Erythema Location: Circumferential Moisture No Abnormalities Noted: No Temperature / Pain [name], [name] J. ([id number]) Maceration: Yes Temperature: No Abnormality Moist: Yes Tenderness on Palpation: Yes Wound Preparation Ulcer Cleansing: Other: soap and water, Treatment Notes Wound #1 (Right, Circumferential Lower Leg) 1. Cleansed with: Cleanse wound with antibacterial soap and water 2. Anesthetic  Topical Lidocaine 4% cream to wound bed prior to debridement 3. Peri-wound Care: Barrier cream 4. Dressing Applied: Aquacel Ag 5. Secondary Dressing Applied ABD Pad 7. Secured with Tape 4 Layer Compression System - Bilateral Electronic Signature(s) Signed: 05/21/2016 4:33:34 PM By: Alejandro Mulling Entered By: Alejandro Mulling on 05/21/2016 14:40:54 [name], [name] ([id number]) -------------------------------------------------------------------------------- Wound Assessment Details Patient Name: [name] Date of Service: 05/21/2016 2:15 PM Medical Record Number: [id number] Patient Account Number: [id number] Date of Birth/Sex: 01/19/1961 (55 y.o. Male) Treating RN: Phillis Haggis Primary Care Physician: Corky Downs Other Clinician: Referring Physician: Corky Downs Treating Physician/Extender: Rudene Re in Treatment: 4 Wound Status Wound Number: 2 Primary Etiology: Venous Leg Ulcer Wound Location: Left Lower Leg - Wound Status: Open Circumfernential Comorbid History: Arrhythmia, Hypertension Wounding Event: Gradually Appeared Date Acquired: 12/14/2015 Weeks Of Treatment: 4 Clustered Wound: No Wound Measurements Length: (cm) 14 Width: (cm) 32 Depth: (cm) 0.2 Area: (cm) 351.858 Volume: (cm) 70.372 % Reduction in Area: 22.9% % Reduction in Volume: -54.1% Epithelialization: None Tunneling: No Undermining: No Wound Description Classification: Partial Thickness Wound Margin:  Flat and Intact Exudate Amount: Large Exudate Type: Serous Exudate Color: amber Wound Bed Granulation Amount: Large (67-100%) Exposed Structure Granulation Quality: Red, Pink Fascia Exposed: No Necrotic Amount: Small (1-33%) Fat Layer Exposed: No Necrotic Quality: Adherent Slough Tendon Exposed: No Muscle Exposed: No Joint Exposed: No Bone Exposed: No Limited to Skin Breakdown Periwound Skin Texture Texture Color No Abnormalities Noted: No No Abnormalities Noted: No Localized Edema: Yes Erythema: Yes Erythema Location: Circumferential Moisture No Abnormalities Noted: No Temperature / Pain [name], [name] J. ([id number]) Temperature: No Abnormality Tenderness on Palpation: Yes Wound Preparation Ulcer Cleansing: Other: soap and water, Topical Anesthetic Applied: Other: lidocaine %, Treatment Notes Wound #2 (Left, Circumferential Lower Leg) 1. Cleansed with: Cleanse wound with antibacterial soap and water 2. Anesthetic Topical Lidocaine 4% cream to wound bed prior to debridement 3. Peri-wound Care: Barrier cream 4. Dressing Applied: Aquacel Ag 5. Secondary Dressing Applied ABD Pad 7. Secured with Tape 4 Layer Compression System - Bilateral Electronic Signature(s) Signed: 05/21/2016 4:33:34 PM By: Alejandro Mulling Entered By: Alejandro Mulling on 05/21/2016 14:41:43 Ducat, [name] ([id number]) -------------------------------------------------------------------------------- Wound Assessment Details Patient Name: [name] Date of Service: 05/21/2016 2:15 PM Medical Record Number: [id number] Patient Account Number: [id number] Date of Birth/Sex: November 29, 1961 (55 y.o. Male) Treating RN: Ashok Cordia, Debi Primary Care Physician: Corky Downs Other Clinician: Referring Physician: Corky Downs Treating Physician/Extender: Rudene Re in Treatment: 4 Wound Status Wound Number: 3 Primary Etiology: Vasculitis Wound Location: Left Foot -  Circumfernential Wound Status: Open Wounding Event: Gradually Appeared Comorbid History: Arrhythmia, Hypertension Date Acquired: 12/14/2015 Weeks Of Treatment: 4 Clustered Wound: No Wound Measurements Length: (cm) 8 Width: (cm) 26 Depth: (cm) 0.1 Area: (cm) 163.363 Volume: (cm) 16.336 % Reduction in Area: 14.4% % Reduction in Volume: 14.4% Epithelialization: None Tunneling: No Undermining: No Wound Description Classification: Partial Thickness Wound Margin: Flat and Intact Exudate Amount: Large Exudate Type: Serous Exudate Color: amber Foul Odor After Cleansing: No Wound Bed Granulation Amount: Large (67-100%) Exposed Structure Granulation Quality: Red, Pink Fascia Exposed: No Necrotic Amount: None Present (0%) Fat Layer Exposed: No Tendon Exposed: No Muscle Exposed: No Joint Exposed: No Bone Exposed: No Limited to Skin Breakdown Periwound Skin Texture Texture Color No Abnormalities Noted: No No Abnormalities Noted: No Localized Edema: Yes Erythema: Yes Erythema Location: Circumferential Moisture No Abnormalities Noted: No Temperature / Pain Maceration: Yes Temperature: No Abnormality Jillson, Brandis J. ([id number]) Moist: Yes Tenderness  on Palpation: Yes Wound Preparation Ulcer Cleansing: Other: soap and water, Topical Anesthetic Applied: None Treatment Notes Wound #3 (Left, Circumferential Foot) 1. Cleansed with: Cleanse wound with antibacterial soap and water 2. Anesthetic Topical Lidocaine 4% cream to wound bed prior to debridement 3. Peri-wound Care: Barrier cream 4. Dressing Applied: Aquacel Ag 5. Secondary Dressing Applied ABD Pad 7. Secured with Tape 4 Layer Compression System - Bilateral Electronic Signature(s) Signed: 05/21/2016 4:33:34 PM By: Alejandro MullingPinkerton, Debra Entered By: Alejandro MullingPinkerton, Debra on 05/21/2016 14:42:25 Drees, [name] ([id number]) -------------------------------------------------------------------------------- Wound  Assessment Details Patient Name: [name] RouteAMERON, Jeffrey J. Date of Service: 05/21/2016 2:15 PM Medical Record Number: [id number] Patient Account Number: [id number] Date of Birth/Sex: 07-Aug-1961 [age] y.o. Male) Treating RN: Phillis HaggisPinkerton, Debi Primary Care Physician: Corky DownsMASOUD, [name] Other Clinician: Referring Physician: Corky DownsMASOUD, [name] Treating Physician/Extender: Rudene ReBritto, Errol Weeks in Treatment: 4 Wound Status Wound Number: 4 Primary Etiology: Vasculitis Wound Location: Right Foot - Circumfernential Wound Status: Open Wounding Event: Gradually Appeared Comorbid History: Arrhythmia, Hypertension Date Acquired: 12/14/2015 Weeks Of Treatment: 4 Clustered Wound: No Wound Measurements Length: (cm) 10 Width: (cm) 26 Depth: (cm) 0.1 Area: (cm) 204.204 Volume: (cm) 20.42 % Reduction in Area: 7.1% % Reduction in Volume: 7.1% Epithelialization: None Tunneling: No Undermining: No Wound Description Classification: Partial Thickness Wound Margin: Flat and Intact Exudate Amount: Large Exudate Type: Serous Exudate Color: amber Foul Odor After Cleansing: No Wound Bed Granulation Amount: Large (67-100%) Exposed Structure Granulation Quality: Red, Pink Fascia Exposed: No Necrotic Amount: None Present (0%) Fat Layer Exposed: No Tendon Exposed: No Muscle Exposed: No Joint Exposed: No Bone Exposed: No Limited to Skin Breakdown Periwound Skin Texture Texture Color No Abnormalities Noted: No No Abnormalities Noted: No Localized Edema: Yes Erythema: Yes Erythema Location: Circumferential Moisture No Abnormalities Noted: No Temperature / Pain Maceration: Yes Temperature: No Abnormality Winberg, Ankith J. ([id number]) Moist: Yes Tenderness on Palpation: Yes Wound Preparation Ulcer Cleansing: Other: soap and water, Topical Anesthetic Applied: None Treatment Notes Wound #4 (Right, Circumferential Foot) 1. Cleansed with: Cleanse wound with antibacterial soap and water 2.  Anesthetic Topical Lidocaine 4% cream to wound bed prior to debridement 3. Peri-wound Care: Barrier cream 4. Dressing Applied: Aquacel Ag 5. Secondary Dressing Applied ABD Pad 7. Secured with Tape 4 Layer Compression System - Bilateral Electronic Signature(s) Signed: 05/21/2016 4:33:34 PM By: Alejandro MullingPinkerton, Debra Entered By: Alejandro MullingPinkerton, Debra on 05/21/2016 14:44:17 Denis, [name] ([id number]) -------------------------------------------------------------------------------- Vitals Details Patient Name: [name] RouteAMERON, Denson J. Date of Service: 05/21/2016 2:15 PM Medical Record Number: [id number] Patient Account Number: [id number] Date of Birth/Sex: 07-Aug-1961 [age] y.o. Male) Treating RN: Ashok CordiaPinkerton, Debi Primary Care Physician: Corky DownsMASOUD, [name] Other Clinician: Referring Physician: Corky DownsMASOUD, [name] Treating Physician/Extender: Rudene ReBritto, Errol Weeks in Treatment: 4 Vital Signs Time Taken: 14:23 Temperature (F): 98.2 Height (in): 76 Pulse (bpm): 68 Weight (lbs): 320 Respiratory Rate (breaths/min): 20 Body Mass Index (BMI): 38.9 Blood Pressure (mmHg): 152/94 Reference Range: 80 - 120 mg / dl Electronic Signature(s) Signed: 05/21/2016 4:33:34 PM By: Alejandro MullingPinkerton, Debra Entered By: Alejandro MullingPinkerton, Debra on 05/21/2016 14:23:26

## 2016-05-24 NOTE — Progress Notes (Signed)
[name], Naheem J. ([id number]) Visit Report for 05/21/2016 Chief Complaint Document Details Patient Name: [name], [name] J. Date of Service: 05/21/2016 2:15 PM Medical Record Number: [id number] Patient Account Number: [id number] Date of Birth/Sex: 06/14/1961 [age] y.o. Male) Treating RN: Phillis HaggisPinkerton, Debi Primary Care Physician: Corky DownsMASOUD, [name] Other Clinician: Referring Physician: Corky DownsMASOUD, [name] Treating Physician/Extender: Rudene ReBritto, Haileigh Pitz Weeks in Treatment: 4 Information Obtained from: Patient Chief Complaint Patient presents to the wound care center for a consult due non healing wound to both lower extremities and accompanied by swelling and this has been worse for the last 5 months. He says the swelling of both lower extremity has been there for at least 6-7 years. Electronic Signature(s) Signed: 05/21/2016 3:09:35 PM By: Evlyn KannerBritto, Savier Trickett MD, FACS Entered By: Evlyn KannerBritto, Romelle Muldoon on 05/21/2016 15:09:35 Skare, [name] ([id number]) -------------------------------------------------------------------------------- HPI Details Patient Name: [name], [name] J. Date of Service: 05/21/2016 2:15 PM Medical Record Number: [id number] Patient Account Number: [id number] Date of Birth/Sex: 06/14/1961 [age] y.o. Male) Treating RN: Phillis HaggisPinkerton, Debi Primary Care Physician: Corky DownsMASOUD, [name] Other Clinician: Referring Physician: Corky DownsMASOUD, [name] Treating Physician/Extender: Rudene ReBritto, Timmie Dugue Weeks in Treatment: 4 History of Present Illness Location: ulceration and weeping of both lower extremities left more than the right Quality: Patient reports experiencing heaviness to affected area(s). Severity: Patient states wound are getting worse. Duration: Patient has had the wound for > 5 months prior to seeking treatment at the wound center Timing: Pain in wound is Intermittent (comes and goes Context: The wound would happen gradually Modifying Factors: Other treatment(s) tried include:has been having weekly wraps applied to both  lower extremities at Dr. Zannie KehrShankar's office Associated Signs and Symptoms: Patient reports having increase swelling. HPI Description: 55 year old gentleman who has been treated in the past for venous ulcers of the lower extremity is also known to be a smoker for the last 20 years and smokes about a pack of cigarettes a day. He has been seen by Dr. Evette CristalSankar who has been treating left lower extremity venous ulcer with an Unna boot and have consulted vascular surgery to be seen by Dr. Gilda CreaseSchnier. the patient was also recently admitted to the hospital on 04/17/2016 and discharged on 04/19/2016 with bleeding, hyponatremia, cellulitis of the right leg, laceration of the right foot. 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 He was admitted to hospital with significant bleeding from the ulcer and initially started on IV antibiotics for underlying cellulitis which was suspected. Patient was seen by Dr. Evie LacksEsco of surgery and Dr Alberteen Spindleline of podiatry. Dr. Graciela HusbandsKlein had done excisional debridement of some of the superficial skin slough as well as the ulcerative area on the posterior aspect of the left ankle region. Unna's boots was applied. He was asked to follow-up at the wound center. He is on Xarelto for his chronic atrial fibrillation. 04/30/2016 -- the patient was seen by the PA at the vein and vascular practice who did a consultation but did not have any investigations ordered or done. The patient continues to have a lot of oozing from his wounds. Electronic Signature(s) Signed: 05/21/2016 3:09:54 PM By: Evlyn KannerBritto, Knight Oelkers MD, FACS Entered By: Evlyn KannerBritto, Brisia Schuermann on 05/21/2016 15:09:54 Amos, [name] ([id number]) -------------------------------------------------------------------------------- Physical Exam Details Patient Name: [name], [name] J. Date of Service: 05/21/2016 2:15 PM Medical Record Number: [id number] Patient Account  Number: [id number] Date of Birth/Sex: 06/14/1961 [age] y.o. Male) Treating RN: Phillis HaggisPinkerton, Debi Primary Care Physician: Corky DownsMASOUD, [name] Other Clinician: Referring Physician: Corky DownsMASOUD, [name] Treating Physician/Extender: Rudene ReBritto, Staphany Ditton Weeks in Treatment: 4  Constitutional . Pulse regular. Respirations normal and unlabored. Afebrile. . Eyes Nonicteric. Reactive to light. Ears, Nose, Mouth, and Throat Lips, teeth, and gums WNL.Marland Kitchen Moist mucosa without lesions. Neck supple and nontender. No palpable supraclavicular or cervical adenopathy. Normal sized without goiter. Respiratory WNL. No retractions.. Cardiovascular Pedal Pulses WNL. he continues to have significant amount of lymphedema. Chest Breasts symmetical and no nipple discharge.. Breast tissue WNL, no masses, lumps, or tenderness.. Lymphatic No adneopathy. No adenopathy. No adenopathy. Musculoskeletal Adexa without tenderness or enlargement.. Digits and nails w/o clubbing, cyanosis, infection, petechiae, ischemia, or inflammatory conditions.. Integumentary (Hair, Skin) No suspicious lesions. No crepitus or fluctuance. No peri-wound warmth or erythema. No masses.Marland Kitchen Psychiatric Judgement and insight Intact.. No evidence of depression, anxiety, or agitation.. Notes the lymphedema has gone down significantly but still persists and on the left lower extremity he has got several areas which are several ulcerated. However overall there is very good improvement. Electronic Signature(s) Signed: 05/21/2016 3:10:33 PM By: Evlyn Kanner MD, FACS Entered By: Evlyn Kanner on 05/21/2016 15:10:33 Hires, [name] ([id number]) -------------------------------------------------------------------------------- Physician Orders Details Patient Name: [name] Route Date of Service: 05/21/2016 2:15 PM Medical Record Number: [id number] Patient Account Number: [id number] Date of Birth/Sex: 1961-02-27 (55 y.o. Male) Treating RN: Phillis Haggis Primary Care  Physician: Corky Downs Other Clinician: Referring Physician: Corky Downs Treating Physician/Extender: Rudene Re in Treatment: 4 Verbal / Phone Orders: Yes ClinicianAshok Cordia, Debi Read Back and Verified: Yes Diagnosis Coding Wound Cleansing Wound #1 Right,Circumferential Lower Leg o Cleanse wound with mild soap and water - when changing the wrap Wound #2 Left,Circumferential Lower Leg o Cleanse wound with mild soap and water - when changing the wrap Wound #3 Left,Circumferential Foot o Cleanse wound with mild soap and water - when changing the wrap Wound #4 Right,Circumferential Foot o Cleanse wound with mild soap and water - when changing the wrap Anesthetic Wound #3 Left,Circumferential Foot o Topical Lidocaine 4% cream applied to wound bed prior to debridement - for office use only Skin Barriers/Peri-Wound Care Wound #1 Right,Circumferential Lower Leg o Barrier cream Wound #2 Left,Circumferential Lower Leg o Barrier cream Wound #3 Left,Circumferential Foot o Barrier cream - around the wounds Wound #4 Right,Circumferential Foot o Barrier cream - around the wounds Primary Wound Dressing Wound #1 Right,Circumferential Lower Leg o Aquacel Ag Wound #2 Left,Circumferential Lower Leg o Aquacel Ag Gosselin, Thaer J. ([id number]) Wound #3 Left,Circumferential Foot o Aquacel Ag Wound #4 Right,Circumferential Foot o Aquacel Ag Secondary Dressing Wound #1 Right,Circumferential Lower Leg o ABD pad o XtraSorb Wound #2 Left,Circumferential Lower Leg o ABD pad o XtraSorb Wound #3 Left,Circumferential Foot o ABD pad o XtraSorb Wound #4 Right,Circumferential Foot o ABD pad o XtraSorb Dressing Change Frequency Wound #1 Right,Circumferential Lower Leg o Three times weekly - Home Health to change wraps Monday and Wednesday Pt is seen in clinic on Friday and is changed then Wound #2 Left,Circumferential Lower Leg o  Three times weekly - Home Health to change wraps Monday and Wednesday Pt is seen in clinic on Friday and is changed then Wound #3 Left,Circumferential Foot o Three times weekly - Home Health to change wraps Monday and Wednesday Pt is seen in clinic on Friday and is changed then Wound #4 Right,Circumferential Foot o Three times weekly - Home Health to change wraps Monday and Wednesday Pt is seen in clinic on Friday and is changed then Follow-up Appointments Wound #1 Right,Circumferential Lower Leg o Return Appointment in 1 week. Wound #2 Left,Circumferential Lower Leg o  Return Appointment in 1 week. Reveron, [name] ([id number]) Wound #3 Left,Circumferential Foot o Return Appointment in 1 week. Wound #4 Right,Circumferential Foot o Return Appointment in 1 week. Edema Control Wound #1 Right,Circumferential Lower Leg o 4 Layer Compression System - Bilateral - Home Health to change Monday and Wednesday o Elevate legs to the level of the heart and pump ankles as often as possible Wound #2 Left,Circumferential Lower Leg o 4 Layer Compression System - Bilateral - Home Health to change Monday and Wednesday o Elevate legs to the level of the heart and pump ankles as often as possible Wound #3 Left,Circumferential Foot o 4 Layer Compression System - Bilateral - Home Health to change Monday and Wednesday o Elevate legs to the level of the heart and pump ankles as often as possible Wound #4 Right,Circumferential Foot o 4 Layer Compression System - Bilateral - Home Health to change Monday and Wednesday o Elevate legs to the level of the heart and pump ankles as often as possible Additional Orders / Instructions Wound #1 Right,Circumferential Lower Leg o Stop Smoking o Increase protein intake. Wound #2 Left,Circumferential Lower Leg o Stop Smoking o Increase protein intake. Wound #3 Left,Circumferential Foot o Stop Smoking o Increase protein  intake. Wound #4 Right,Circumferential Foot o Stop Smoking o Increase protein intake. Home Health Wound #1 Right,Circumferential Lower Leg o Continue [facility] Health Visits - [facility] ******Home Health to change wraps MONDAY and WEDNESDAY Pt is seen in clinic on Friday and is changed then****** o Home Health Nurse may visit PRN to address patientos wound care needs. Langwell, [name] ([id number]) o FACE TO FACE ENCOUNTER: MEDICARE and MEDICAID PATIENTS: I certify that this patient is under my care and that I had a face-to-face encounter that meets the physician face-to-face encounter requirements with this patient on this date. The encounter with the patient was in whole or in part for the following MEDICAL CONDITION: (primary reason for Home Healthcare) MEDICAL NECESSITY: I certify, that based on my findings, NURSING services are a medically necessary home health service. HOME BOUND STATUS: I certify that my clinical findings support that this patient is homebound (i.e., Due to illness or injury, pt requires aid of supportive devices such as crutches, cane, wheelchairs, walkers, the use of special transportation or the assistance of another person to leave their place of residence. There is a normal inability to leave the home and doing so requires considerable and taxing effort. Other absences are for medical reasons / religious services and are infrequent or of short duration when for other reasons). o If current dressing causes regression in wound condition, may D/C ordered dressing product/s and apply Normal Saline Moist Dressing daily until next Wound Healing Center / Other MD appointment. Notify Wound Healing Center of regression in wound condition at [phone]. o Please direct any NON-WOUND related issues/requests for orders to patient's Primary Care Physician Wound #2 Left,Circumferential Lower Leg o Continue Home Health Visits - [facility] ******Home Health to  change wraps MONDAY and WEDNESDAY Pt is seen in clinic on Friday and is changed then****** o Home Health Nurse may visit PRN to address patientos wound care needs. o FACE TO FACE ENCOUNTER: MEDICARE and MEDICAID PATIENTS: I certify that this patient is under my care and that I had a face-to-face encounter that meets the physician face-to-face encounter requirements with this patient on this date. The encounter with the patient was in whole or in part for the following MEDICAL CONDITION: (primary reason for Home Healthcare) MEDICAL NECESSITY: I  certify, that based on my findings, NURSING services are a medically necessary home health service. HOME BOUND STATUS: I certify that my clinical findings support that this patient is homebound (i.e., Due to illness or injury, pt requires aid of supportive devices such as crutches, cane, wheelchairs, walkers, the use of special transportation or the assistance of another person to leave their place of residence. There is a normal inability to leave the home and doing so requires considerable and taxing effort. Other absences are for medical reasons / religious services and are infrequent or of short duration when for other reasons). o If current dressing causes regression in wound condition, may D/C ordered dressing product/s and apply Normal Saline Moist Dressing daily until next Wound Healing Center / Other MD appointment. Notify Wound Healing Center of regression in wound condition at [phone]. o Please direct any NON-WOUND related issues/requests for orders to patient's Primary Care Physician Wound #3 Left,Circumferential Foot o Continue Home Health Visits - [facility] ******Home Health to change wraps MONDAY and WEDNESDAY Pt is seen in clinic on Friday and is changed then****** o Home Health Nurse may visit PRN to address patientos wound care needs. o FACE TO FACE ENCOUNTER: MEDICARE and MEDICAID PATIENTS: I certify that this  patient is under my care and that I had a face-to-face encounter that meets the physician face-to-face encounter requirements with this patient on this date. The encounter with the patient was in whole or in part for the following MEDICAL CONDITION: (primary reason for Home Healthcare) [name], [name] ([id number]) MEDICAL NECESSITY: I certify, that based on my findings, NURSING services are a medically necessary home health service. HOME BOUND STATUS: I certify that my clinical findings support that this patient is homebound (i.e., Due to illness or injury, pt requires aid of supportive devices such as crutches, cane, wheelchairs, walkers, the use of special transportation or the assistance of another person to leave their place of residence. There is a normal inability to leave the home and doing so requires considerable and taxing effort. Other absences are for medical reasons / religious services and are infrequent or of short duration when for other reasons). o If current dressing causes regression in wound condition, may D/C ordered dressing product/s and apply Normal Saline Moist Dressing daily until next Wound Healing Center / Other MD appointment. Notify Wound Healing Center of regression in wound condition at [phone]. o Please direct any NON-WOUND related issues/requests for orders to patient's Primary Care Physician Wound #4 Right,Circumferential Foot o Continue Home Health Visits - [facility] ******Home Health to change wraps MONDAY and WEDNESDAY Pt is seen in clinic on Friday and is changed then****** o Home Health Nurse may visit PRN to address patientos wound care needs. o FACE TO FACE ENCOUNTER: MEDICARE and MEDICAID PATIENTS: I certify that this patient is under my care and that I had a face-to-face encounter that meets the physician face-to-face encounter requirements with this patient on this date. The encounter with the patient was in whole or in part for  the following MEDICAL CONDITION: (primary reason for Home Healthcare) MEDICAL NECESSITY: I certify, that based on my findings, NURSING services are a medically necessary home health service. HOME BOUND STATUS: I certify that my clinical findings support that this patient is homebound (i.e., Due to illness or injury, pt requires aid of supportive devices such as crutches, cane, wheelchairs, walkers, the use of special transportation or the assistance of another person to leave their place of residence. There is a normal inability  to leave the home and doing so requires considerable and taxing effort. Other absences are for medical reasons / religious services and are infrequent or of short duration when for other reasons). o If current dressing causes regression in wound condition, may D/C ordered dressing product/s and apply Normal Saline Moist Dressing daily until next Wound Healing Center / Other MD appointment. Notify Wound Healing Center of regression in wound condition at [phone]. o Please direct any NON-WOUND related issues/requests for orders to patient's Primary Care Physician Electronic Signature(s) Signed: 05/21/2016 4:33:34 PM By: Alejandro Mulling Signed: 05/24/2016 8:10:40 AM By: Evlyn Kanner MD, FACS Entered By: Alejandro Mulling on 05/21/2016 15:01:22 Gelder, [name] ([id number]) -------------------------------------------------------------------------------- Problem List Details Patient Name: [name] Route Date of Service: 05/21/2016 2:15 PM Medical Record Number: [id number] Patient Account Number: [id number] Date of Birth/Sex: 1961/05/18 (55 y.o. 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 Male) Treating RN: Phillis Haggis Primary Care Physician: Corky Downs Other Clinician: Referring Physician: Corky Downs Treating Physician/Extender: Rudene Re in Treatment: 4 Subjective Chief Complaint Information obtained from Patient Patient presents to the wound care center for a consult due non healing wound to both lower extremities and accompanied by swelling and this has been worse for the last 5 months. He says the swelling of both lower extremity has been there for at least 6-7 years. History of Present Illness (HPI) The following HPI elements were documented for the patient's wound: Location: ulceration and weeping of both lower extremities left more than the right Quality: Patient reports experiencing heaviness to affected area(s). Severity: Patient states wound are getting worse. Duration: Patient has had the wound for > 5 months prior to seeking treatment at the wound center Timing: Pain in wound is Intermittent (comes and goes Context: The wound would happen gradually Modifying Factors: Other treatment(s) tried include:has been having weekly wraps applied to both lower extremities at  Dr. Zannie Kehr office Associated Signs and Symptoms: Patient reports having increase swelling. 55 year old gentleman who has been treated in the past for venous ulcers of the lower extremity is also known to be a smoker for the last 20 years and smokes about a pack of cigarettes a day. He has been seen by Dr. Evette Cristal who has been treating left lower extremity venous ulcer with an Unna boot and have consulted vascular surgery to be seen by Dr. Gilda Crease. the patient was also recently admitted to the hospital on 04/17/2016 and discharged on 04/19/2016 with bleeding, hyponatremia, cellulitis of the right leg, laceration of the right foot. 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 He was admitted to hospital with significant bleeding from the ulcer and initially started on IV antibiotics for underlying cellulitis which was suspected. Patient was  seen by Dr. Evie Lacks of surgery and Dr Alberteen Spindle of podiatry. Dr. Graciela Husbands had done excisional debridement of some of the superficial skin slough as well as the ulcerative area on the posterior aspect of the left ankle region. Unna's boots was applied. He was asked to follow-up at the wound center. He is on Xarelto for his chronic atrial fibrillation. 04/30/2016 -- the patient was seen by the PA at the vein and vascular practice who did a consultation but did not have any investigations ordered or done. The patient continues to have a lot of oozing from his wounds. Mattera, [name] ([id number]) Objective Constitutional Pulse regular. Respirations normal and unlabored. Afebrile. Vitals Time Taken: 2:23 PM, Height: 76 in, Weight: 320 lbs, BMI: 38.9, Temperature: 98.2 F, Pulse: 68 bpm, Respiratory Rate: 20 breaths/min, Blood Pressure: 152/94 mmHg. Eyes Nonicteric. Reactive to light. Ears, Nose, Mouth, and Throat Lips, teeth, and gums WNL.Marland Kitchen Moist mucosa without  lesions. Neck supple and nontender. No palpable supraclavicular or cervical adenopathy. Normal sized without goiter. Respiratory WNL. No retractions.. Cardiovascular Pedal Pulses WNL. he continues to have significant amount of lymphedema. Chest Breasts symmetical and no nipple discharge.. Breast tissue WNL, no masses, lumps, or tenderness.. Lymphatic No adneopathy. No adenopathy. No adenopathy. Musculoskeletal Adexa without tenderness or enlargement.. Digits and nails w/o clubbing, cyanosis, infection, petechiae, ischemia, or inflammatory conditions.Marland Kitchen Psychiatric Judgement and insight Intact.. No evidence of depression, anxiety, or agitation.. General Notes: the lymphedema has gone down significantly but still persists and on the left lower extremity he has got several areas which are several ulcerated. However overall there is very good improvement. Integumentary (Hair, Skin) No suspicious lesions. No crepitus or fluctuance. No peri-wound warmth or erythema. No masses.. Wound #1 status is Open. Original cause of wound was Gradually Appeared. The wound is located on the Right,Circumferential Lower Leg. The wound measures 8cm length x 8cm width x 0.1cm depth; 50.265cm^2 area and 5.027cm^3 volume. The wound is limited to skin breakdown. There is no tunneling or Reiber, Nigel J. ([id number]) undermining noted. There is a large amount of serous drainage noted. The wound margin is flat and intact. There is large (67-100%) red granulation within the wound bed. There is a small (1-33%) amount of necrotic tissue within the wound bed including Adherent Slough. The periwound skin appearance exhibited: Localized Edema, Maceration, Moist, Erythema. The surrounding wound skin color is noted with erythema which is circumferential. Periwound temperature was noted as No Abnormality. The periwound has tenderness on palpation. Wound #2 status is Open. Original cause of wound was Gradually Appeared. The  wound is located on the Left,Circumferential Lower Leg. The wound measures 14cm length x 32cm width x 0.2cm depth; 351.858cm^2 area and 70.372cm^3 volume. The wound is limited to skin breakdown. There is no tunneling or undermining noted. There is a large amount of serous drainage noted. The wound margin is flat and intact. There is large (67-100%) red, pink granulation within the wound bed. There is a small (1-33%) amount of necrotic tissue within the wound bed including Adherent Slough. The periwound skin appearance exhibited: Localized Edema, Erythema. The surrounding wound skin color is noted with erythema which is circumferential. Periwound temperature was noted as No Abnormality. The periwound has tenderness on palpation. Wound #3 status is Open. Original cause of wound was Gradually Appeared. The wound is located on the Left,Circumferential Foot. The wound measures 8cm length x 26cm width x 0.1cm depth; 163.363cm^2 area and 16.336cm^3 volume. The wound is limited to skin breakdown. There is no tunneling  or undermining noted. There is a large amount of serous drainage noted. The wound margin is flat and intact. There is large (67-100%) red, pink granulation within the wound bed. There is no necrotic tissue within the wound bed. The periwound skin appearance exhibited: Localized Edema, Maceration, Moist, Erythema. The surrounding wound skin color is noted with erythema which is circumferential. Periwound temperature was noted as No Abnormality. The periwound has tenderness on palpation. Wound #4 status is Open. Original cause of wound was Gradually Appeared. The wound is located on the Right,Circumferential Foot. The wound measures 10cm length x 26cm width x 0.1cm depth; 204.204cm^2 area and 20.42cm^3 volume. The wound is limited to skin breakdown. There is no tunneling or undermining noted. There is a large amount of serous drainage noted. The wound margin is flat and intact. There is  large (67-100%) red, pink granulation within the wound bed. There is no necrotic tissue within the wound bed. The periwound skin appearance exhibited: Localized Edema, Maceration, Moist, Erythema. The surrounding wound skin color is noted with erythema which is circumferential. Periwound temperature was noted as No Abnormality. The periwound has tenderness on palpation. 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 ([id number]) I have discussed with him that he may need to talk to his PCP regarding putting him on a diuretic. Besides encouraging elevation and exercise I have recommended Aquacel Ag with a 4-layer Profore wrap and he will come regularly for wound care visits. His dressing is being changed 3 times a week And I said that we may be able to go down to twice a week. Plan Wound Cleansing: Wound #1 Right,Circumferential Lower Leg: Cleanse wound with mild soap and water - when changing the wrap Wound #2 Left,Circumferential Lower Leg: Cleanse wound with mild soap and water - when changing the wrap Wound #3 Left,Circumferential Foot: Cleanse wound with mild soap and water - when changing the wrap Wound #4 Right,Circumferential Foot: Cleanse wound with mild soap and water - when changing the wrap Anesthetic: Wound #3 Left,Circumferential Foot: Topical Lidocaine 4% cream applied to wound bed prior to debridement - for office use only Skin Barriers/Peri-Wound Care: Wound #1 Right,Circumferential Lower Leg: Barrier cream Wound #2 Left,Circumferential Lower Leg: Barrier cream Wound #3 Left,Circumferential Foot: Barrier cream - around the wounds Wound #4 Right,Circumferential  Foot: Barrier cream - around the wounds Primary Wound Dressing: Wound #1 Right,Circumferential Lower Leg: Aquacel Ag Wound #2 Left,Circumferential Lower Leg: Aquacel Ag Wound #3 Left,Circumferential Foot: Aquacel Ag Wound #4 Right,Circumferential Foot: Aquacel Ag Secondary Dressing: Wound #1 Right,Circumferential Lower Leg: ABD pad XtraSorb Wound #2 Left,Circumferential Lower Leg: ABD pad XtraSorb Wound #3 Left,Circumferential Foot: Nims, Trason J. ([id number]) ABD pad XtraSorb Wound #4 Right,Circumferential Foot: ABD pad XtraSorb Dressing Change Frequency: Wound #1 Right,Circumferential Lower Leg: Three times weekly - Home Health to change wraps Monday and Wednesday Pt is seen in clinic on Friday and is changed then Wound #2 Left,Circumferential Lower Leg: Three times weekly - Home Health to change wraps Monday and Wednesday Pt is seen in clinic on Friday and is changed then Wound #3 Left,Circumferential Foot: Three times weekly - Home Health to change wraps Monday and Wednesday Pt is seen in clinic on Friday and is changed then Wound #4 Right,Circumferential Foot: Three times weekly - Home Health  to change wraps Monday and Wednesday Pt is seen in clinic on Friday and is changed then Follow-up Appointments: Wound #1 Right,Circumferential Lower Leg: Return Appointment in 1 week. Wound #2 Left,Circumferential Lower Leg: Return Appointment in 1 week. Wound #3 Left,Circumferential Foot: Return Appointment in 1 week. Wound #4 Right,Circumferential Foot: Return Appointment in 1 week. Edema Control: Wound #1 Right,Circumferential Lower Leg: 4 Layer Compression System - Bilateral - Home Health to change Monday and Wednesday Elevate legs to the level of the heart and pump ankles as often as possible Wound #2 Left,Circumferential Lower Leg: 4 Layer Compression System - Bilateral - Home Health to change Monday and Wednesday Elevate legs to the level of the heart and pump  ankles as often as possible Wound #3 Left,Circumferential Foot: 4 Layer Compression System - Bilateral - Home Health to change Monday and Wednesday Elevate legs to the level of the heart and pump ankles as often as possible Wound #4 Right,Circumferential Foot: 4 Layer Compression System - Bilateral - Home Health to change Monday and Wednesday Elevate legs to the level of the heart and pump ankles as often as possible Additional Orders / Instructions: Wound #1 Right,Circumferential Lower Leg: Stop Smoking Increase protein intake. Wound #2 Left,Circumferential Lower Leg: Stop Smoking Increase protein intake. Wound #3 Left,Circumferential Foot: Stop Smoking Increase protein intake. Wound #4 Right,Circumferential Foot: Villamizar, Avant J. ([id number]) Stop Smoking Increase protein intake. Home Health: Wound #1 Right,Circumferential Lower Leg: Continue [facility] Health Visits - [facility] ******Home Health to change wraps MONDAY and WEDNESDAY Pt is seen in clinic on Friday and is changed then****** Home Health Nurse may visit PRN to address patient s wound care needs. FACE TO FACE ENCOUNTER: MEDICARE and MEDICAID PATIENTS: I certify that this patient is under my care and that I had a face-to-face encounter that meets the physician face-to-face encounter requirements with this patient on this date. The encounter with the patient was in whole or in part for the following MEDICAL CONDITION: (primary reason for Home Healthcare) MEDICAL NECESSITY: I certify, that based on my findings, NURSING services are a medically necessary home health service. HOME BOUND STATUS: I certify that my clinical findings support that this patient is homebound (i.e., Due to illness or injury, pt requires aid of supportive devices such as crutches, cane, wheelchairs, walkers, the use of special transportation or the assistance of another person to leave their place of residence. There is a normal inability to leave the  home and doing so requires considerable and taxing effort. Other absences are for medical reasons / religious services and are infrequent or of short duration when for other reasons). If current dressing causes regression in wound condition, may D/C ordered dressing product/s and apply Normal Saline Moist Dressing daily until next Wound Healing Center / Other MD appointment. Notify Wound Healing Center of regression in wound condition at [phone]. Please direct any NON-WOUND related issues/requests for orders to patient's Primary Care Physician Wound #2 Left,Circumferential Lower Leg: Continue Home Health Visits - [facility] ******Home Health to change wraps MONDAY and WEDNESDAY Pt is seen in clinic on Friday and is changed then****** Home Health Nurse may visit PRN to address patient s wound care needs. FACE TO FACE ENCOUNTER: MEDICARE and MEDICAID PATIENTS: I certify that this patient is under my care and that I had a face-to-face encounter that meets the physician face-to-face encounter requirements with this patient on this date. The encounter with the patient was in whole or in part for the following MEDICAL CONDITION: (primary reason  for Home Healthcare) MEDICAL NECESSITY: I certify, that based on my findings, NURSING services are a medically necessary home health service. HOME BOUND STATUS: I certify that my clinical findings support that this patient is homebound (i.e., Due to illness or injury, pt requires aid of supportive devices such as crutches, cane, wheelchairs, walkers, the use of special transportation or the assistance of another person to leave their place of residence. There is a normal inability to leave the home and doing so requires considerable and taxing effort. Other absences are for medical reasons / religious services and are infrequent or of short duration when for other reasons). If current dressing causes regression in wound condition, may D/C ordered dressing  product/s and apply Normal Saline Moist Dressing daily until next Wound Healing Center / Other MD appointment. Notify Wound Healing Center of regression in wound condition at [phone]. Please direct any NON-WOUND related issues/requests for orders to patient's Primary Care Physician Wound #3 Left,Circumferential Foot: Continue Home Health Visits - [facility] ******Home Health to change wraps MONDAY and WEDNESDAY Pt is seen in clinic on Friday and is changed then****** Home Health Nurse may visit PRN to address patient s wound care needs. FACE TO FACE ENCOUNTER: MEDICARE and MEDICAID PATIENTS: I certify that this patient is under my care and that I had a face-to-face encounter that meets the physician face-to-face encounter requirements with this patient on this date. The encounter with the patient was in whole or in part for the following MEDICAL CONDITION: (primary reason for Home Healthcare) MEDICAL NECESSITY: I certify, that based on my findings, NURSING services are a medically necessary home health service. HOME BOUND STATUS: I certify that my clinical findings support that this patient is homebound (i.e., Due to illness or injury, pt requires aid of supportive devices such as crutches, cane, wheelchairs, walkers, the use of special transportation or the assistance of another person to leave their place of residence. There is a [name], [name] J. ([id number]) normal inability to leave the home and doing so requires considerable and taxing effort. Other absences are for medical reasons / religious services and are infrequent or of short duration when for other reasons). If current dressing causes regression in wound condition, may D/C ordered dressing product/s and apply Normal Saline Moist Dressing daily until next Wound Healing Center / Other MD appointment. Notify Wound Healing Center of regression in wound condition at [phone]. Please direct any NON-WOUND related issues/requests  for orders to patient's Primary Care Physician Wound #4 Right,Circumferential Foot: Continue Home Health Visits - [facility] ******Home Health to change wraps MONDAY and WEDNESDAY Pt is seen in clinic on Friday and is changed then****** Home Health Nurse may visit PRN to address patient s wound care needs. FACE TO FACE ENCOUNTER: MEDICARE and MEDICAID PATIENTS: I certify that this patient is under my care and that I had a face-to-face encounter that meets the physician face-to-face encounter requirements with this patient on this date. The encounter with the patient was in whole or in part for the following MEDICAL CONDITION: (primary reason for Home Healthcare) MEDICAL NECESSITY: I certify, that based on my findings, NURSING services are a medically necessary home health service. HOME BOUND STATUS: I certify that my clinical findings support that this patient is homebound (i.e., Due to illness or injury, pt requires aid of supportive devices such as crutches, cane, wheelchairs, walkers, the use of special transportation or the assistance of another person to leave their place of residence. There is a normal inability to leave  the home and doing so requires considerable and taxing effort. Other absences are for medical reasons / religious services and are infrequent or of short duration when for other reasons). If current dressing causes regression in wound condition, may D/C ordered dressing product/s and apply Normal Saline Moist Dressing daily until next Wound Healing Center / Other MD appointment. Notify Wound Healing Center of regression in wound condition at [phone]. Please direct any NON-WOUND related issues/requests for orders to patient's Primary Care Physician I have discussed with him that he may need to talk to his PCP regarding putting him on a diuretic. Besides encouraging elevation and exercise I have recommended Aquacel Ag with a 4-layer Profore wrap and he will come regularly  for wound care visits. His dressing is being changed 3 times a week And I said that we may be able to go down to twice a week. Electronic Signature(s) Signed: 05/21/2016 3:12:14 PM By: Evlyn Kanner MD, FACS Entered By: Evlyn Kanner on 05/21/2016 15:12:14 Gater, [name] ([id number]) -------------------------------------------------------------------------------- SuperBill Details Patient Name: [name] Route Date of Service: 05/21/2016 Medical Record Number: [id number] Patient Account Number: [id number] Date of Birth/Sex: 10-31-1961 (55 y.o. 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

## 2016-05-28 ENCOUNTER — Encounter: Payer: BC Managed Care – PPO | Admitting: Surgery

## 2016-05-28 DIAGNOSIS — I890 Lymphedema, not elsewhere classified: Secondary | ICD-10-CM | POA: Diagnosis not present

## 2016-05-29 NOTE — Progress Notes (Addendum)
[name], [name] ([id number]) Visit Report for 05/28/2016 Chief Complaint Document Details Patient Name: [name], [name] Date of Service: 05/28/2016 3:45 PM Medical Record Number: [id number] Patient Account Number: [id number] Date of Birth/Sex: 11-05-1961 (55 y.o. Male) Treating RN: Phillis Haggis Primary Care Physician: Corky Downs Other Clinician: Referring Physician: Corky Downs Treating Physician/Extender: Rudene Re in Treatment: 5 Information Obtained from: Patient Chief Complaint Patient presents to the wound care center for a consult due non healing wound to both lower extremities and accompanied by swelling and this has been worse for the last 5 months. He says the swelling of both lower extremity has been there for at least 6-7 years. Electronic Signature(s) Signed: 05/28/2016 4:31:10 PM By: Evlyn Kanner MD, FACS Entered By: Evlyn Kanner on 05/28/2016 16:31:10 Yellin, [name] ([id number]) -------------------------------------------------------------------------------- HPI Details Patient Name: [name] Date of Service: 05/28/2016 3:45 PM Medical Record Number: [id number] Patient Account Number: [id number] Date of Birth/Sex: 08/05/1961 (55 y.o. Male) Treating RN: Phillis Haggis Primary Care Physician: Corky Downs Other Clinician: Referring Physician: Corky Downs Treating Physician/Extender: Rudene Re in Treatment: 5 History of Present Illness Location: ulceration and weeping of both lower extremities left more than the right Quality: Patient reports experiencing heaviness to affected area(s). Severity: Patient states wound are getting worse. Duration: Patient has had the wound for > 5 months prior to seeking treatment at the wound center Timing: Pain in wound is Intermittent (comes and goes Context: The wound would happen gradually Modifying Factors: Other treatment(s) tried include:has been having weekly wraps applied to  both lower extremities at Dr. Zannie Kehr office Associated Signs and Symptoms: Patient reports having increase swelling. HPI Description: 55 year old gentleman who has been treated in the past for venous ulcers of the lower extremity is also known to be a smoker for the last 20 years and smokes about a pack of cigarettes a day. He has been seen by Dr. Evette Cristal who has been treating left lower extremity venous ulcer with an Unna boot and have consulted vascular surgery to be seen by Dr. Gilda Crease. the patient was also recently admitted to the hospital on 04/17/2016 and discharged on 04/19/2016 with bleeding, hyponatremia, cellulitis of the right leg, laceration of the right foot. 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 He was admitted to hospital with significant bleeding from the ulcer and initially started on IV antibiotics for underlying cellulitis which was suspected. Patient was seen by Dr. Evie Lacks of surgery and Dr Alberteen Spindle of podiatry. Dr. Graciela Husbands had done excisional debridement of some of the superficial skin slough as well as the ulcerative area on the posterior aspect of the left ankle region. Unna's boots was applied. He was asked to follow-up at the wound center. He is on Xarelto for his chronic atrial fibrillation. 04/30/2016 -- the patient was seen by the PA at the vein and vascular practice who did a consultation but did not have any investigations ordered or done. The patient continues to have a lot of oozing from his wounds. Electronic Signature(s) Signed: 05/28/2016 4:31:24 PM By: Evlyn Kanner MD, FACS Entered By: Evlyn Kanner on 05/28/2016 16:31:24 Piatt, [name] ([id number]) -------------------------------------------------------------------------------- Physical Exam Details Patient Name: [name] Date of Service: 05/28/2016 3:45 PM Medical Record Number: [id number] Patient  Account Number: [id number] Date of Birth/Sex: [birth year] (55 y.o. Male) Treating RN: Phillis Haggis Primary Care Physician: Corky Downs Other Clinician: Referring Physician: Corky Downs Treating Physician/Extender: Rudene Re in Treatment: 5  Constitutional . Pulse regular. Respirations normal and unlabored. Afebrile. . Eyes Nonicteric. Reactive to light. Ears, Nose, Mouth, and Throat Lips, teeth, and gums WNL.Marland Kitchen Moist mucosa without lesions. Neck supple and nontender. No palpable supraclavicular or cervical adenopathy. Normal sized without goiter. Respiratory WNL. No retractions.. Cardiovascular Pedal Pulses WNL. No clubbing, cyanosis or edema. Gastrointestinal (GI) Abdomen without masses or tenderness.. No liver or spleen enlargement or tenderness.. Genitourinary (GU) No hydrocele, spermatocele, tenderness of the cord, or testicular mass.Marland Kitchen Penis without lesions.Renetta Chalk without lesions. No cystocele, or rectocele. Pelvic support intact, no discharge.Marland Kitchen Urethra without masses, tenderness or scarring.Marland Kitchen Lymphatic No adneopathy. No adenopathy. No adenopathy. Musculoskeletal Adexa without tenderness or enlargement.. Digits and nails w/o clubbing, cyanosis, infection, petechiae, ischemia, or inflammatory conditions.. Integumentary (Hair, Skin) No suspicious lesions. No crepitus or fluctuance. No peri-wound warmth or erythema. No masses.Marland Kitchen Psychiatric Judgement and insight Intact.. No evidence of depression, anxiety, or agitation.. Notes there is much improvement in his lymphedema but some of it is still brawny and persistent. The ulceration on the right leg is completely healed and on the left leg anteriorly I will use a spot of sample over the wound to help clear the subcutaneous debris which is fibrotic. The posterior ulceration on his ankle area is clean and we would use silver alginate. Electronic Signature(s) Signed: 05/28/2016 4:32:36 PM By: Evlyn Kanner MD,  FACS Myre, [name] ([id number]) Entered By: Evlyn Kanner on 05/28/2016 16:32:36 Ambrose, [name] ([id number]) -------------------------------------------------------------------------------- Physician Orders Details Patient Name: [name] Date of Service: 05/28/2016 3:45 PM Medical Record Number: [id number] Patient Account Number: [id number] Date of Birth/Sex: 02-01-1961 (55 y.o. Male) Treating RN: Phillis Haggis Primary Care Physician: Corky Downs Other Clinician: Referring Physician: Corky Downs Treating Physician/Extender: Rudene Re in Treatment: 5 Verbal / Phone Orders: Yes ClinicianAshok Cordia, Debi Read Back and Verified: Yes Diagnosis Coding Wound Cleansing Wound #1 Right,Circumferential Lower Leg o Cleanse wound with mild soap and water - when changing the wrap Wound #2 Left,Circumferential Lower Leg o Cleanse wound with mild soap and water - when changing the wrap Wound #3 Left,Circumferential Foot o Cleanse wound with mild soap and water - when changing the wrap Wound #4 Right,Circumferential Foot o Cleanse wound with mild soap and water - when changing the wrap Anesthetic Wound #3 Left,Circumferential Foot o Topical Lidocaine 4% cream applied to wound bed prior to debridement - for office use only Skin Barriers/Peri-Wound Care Wound #1 Right,Circumferential Lower Leg o Barrier cream Wound #2 Left,Circumferential Lower Leg o Barrier cream Wound #3 Left,Circumferential Foot o Barrier cream - around the wounds Wound #4 Right,Circumferential Foot o Barrier cream - around the wounds Primary Wound Dressing Wound #1 Right,Circumferential Lower Leg o Aquacel Ag Wound #2 Left,Circumferential Lower Leg o Santyl Ointment - ONLY FOR CLINIC***place on anterior lower leg*** one time order o Aquacel Ag Godino, Allie J. ([id number]) Wound #3 Left,Circumferential Foot o Aquacel Ag Wound #4 Right,Circumferential  Foot o Aquacel Ag Secondary Dressing Wound #1 Right,Circumferential Lower Leg o ABD pad Wound #2 Left,Circumferential Lower Leg o ABD pad Wound #3 Left,Circumferential Foot o ABD pad Wound #4 Right,Circumferential Foot o ABD pad Dressing Change Frequency Wound #1 Right,Circumferential Lower Leg o Other: - Home Health to change wraps Tuesday Pt is seen in clinic on Friday and is changed then Wound #2 Left,Circumferential Lower Leg o Other: - Home Health to change wraps Tuesday Pt is seen in clinic on Friday and is changed then Wound #3 Left,Circumferential Foot o Other: - Home Health to  change wraps Tuesday Pt is seen in clinic on Friday and is changed then Wound #4 Right,Circumferential Foot o Other: - Home Health to change wraps Tuesday Pt is seen in clinic on Friday and is changed then Follow-up Appointments Wound #1 Right,Circumferential Lower Leg o Return Appointment in 1 week. Wound #2 Left,Circumferential Lower Leg o Return Appointment in 1 week. Wound #3 Left,Circumferential Foot o Return Appointment in 1 week. Wound #4 Right,Circumferential Foot Waller, Yiannis J. ([id number]) o Return Appointment in 1 week. Edema Control Wound #1 Right,Circumferential Lower Leg o 4 Layer Compression System - Bilateral - Home Health to change only on Tuesdays unna to anchor 3cm from knee and 3 cm from toes o Elevate legs to the level of the heart and pump ankles as often as possible Wound #2 Left,Circumferential Lower Leg o 4 Layer Compression System - Bilateral - Home Health to change only on Tuesdays unna to anchor 3cm from knee and 3 cm from toes o Elevate legs to the level of the heart and pump ankles as often as possible Wound #3 Left,Circumferential Foot o 4 Layer Compression System - Bilateral - Home Health to change only on Tuesdays unna to anchor 3cm from knee and 3 cm from toes o Elevate legs to the level of the heart and pump  ankles as often as possible Wound #4 Right,Circumferential Foot o 4 Layer Compression System - Bilateral - Home Health to change only on Tuesdays unna to anchor 3cm from knee and 3 cm from toes o Elevate legs to the level of the heart and pump ankles as often as possible Additional Orders / Instructions Wound #1 Right,Circumferential Lower Leg o Stop Smoking o Increase protein intake. Wound #2 Left,Circumferential Lower Leg o Stop Smoking o Increase protein intake. Wound #3 Left,Circumferential Foot o Stop Smoking o Increase protein intake. Wound #4 Right,Circumferential Foot o Stop Smoking o Increase protein intake. Home Health Wound #1 Right,Circumferential Lower Leg o Continue [facility] Health Visits - [facility] ******Home Health to change wraps Tuesday Pt is seen in clinic on Friday and is changed then****** Zaring, [name] ([id number]) o Home Health Nurse may visit PRN to address patientos wound care needs. o FACE TO FACE ENCOUNTER: MEDICARE and MEDICAID PATIENTS: I certify that this patient is under my care and that I had a face-to-face encounter that meets the physician face-to-face encounter requirements with this patient on this date. The encounter with the patient was in whole or in part for the following MEDICAL CONDITION: (primary reason for Home Healthcare) MEDICAL NECESSITY: I certify, that based on my findings, NURSING services are a medically necessary home health service. HOME BOUND STATUS: I certify that my clinical findings support that this patient is homebound (i.e., Due to illness or injury, pt requires aid of supportive devices such as crutches, cane, wheelchairs, walkers, the use of special transportation or the assistance of another person to leave their place of residence. There is a normal inability to leave the home and doing so requires considerable and taxing effort. Other absences are for medical reasons / religious services and  are infrequent or of short duration when for other reasons). o If current dressing causes regression in wound condition, may D/C ordered dressing product/s and apply Normal Saline Moist Dressing daily until next Wound Healing Center / Other MD appointment. Notify Wound Healing Center of regression in wound condition at [phone]. o Please direct any NON-WOUND related issues/requests for orders to patient's Primary Care Physician Wound #2 Left,Circumferential Lower Leg o [facility]  [facility] ******Home Health to change wraps Tuesday Pt is seen in clinic on Friday and is changed then****** o Home Health Nurse may visit PRN to address patientos wound care needs. o FACE TO FACE ENCOUNTER: MEDICARE and MEDICAID PATIENTS: I certify that this patient is under my care and that I had a face-to-face encounter that meets the physician face-to-face encounter requirements with this patient on this date. The encounter with the patient was in whole or in part for the following MEDICAL CONDITION: (primary reason for Home Healthcare) MEDICAL NECESSITY: I certify, that based on my findings, NURSING services are a medically necessary home health service. HOME BOUND STATUS: I certify that my clinical findings support that this patient is homebound (i.e., Due to illness or injury, pt requires aid of supportive devices such as crutches, cane, wheelchairs, walkers, the use of special transportation or the assistance of another person to leave their place of residence. There is a normal inability to leave the home and doing so requires considerable and taxing effort. Other absences are for medical reasons / religious services and are infrequent or of short duration when for other reasons). o If current dressing causes regression in wound condition, may D/C ordered dressing product/s and apply Normal Saline Moist Dressing daily until next Wound Healing Center / Other MD appointment.  Notify Wound Healing Center of regression in wound condition at [phone]. o Please direct any NON-WOUND related issues/requests for orders to patient's Primary Care Physician Wound #3 Left,Circumferential Foot o [facility] ******Home Health to change wraps Tuesday Pt is seen in clinic on Friday and is changed then****** o Home Health Nurse may visit PRN to address patientos wound care needs. o FACE TO FACE ENCOUNTER: MEDICARE and MEDICAID PATIENTS: I certify that this patient is under my care and that I had a face-to-face encounter that meets the physician face-to-face encounter requirements with this patient on this date. The encounter with the patient was in whole or in part for the following MEDICAL CONDITION: (primary reason for Home Healthcare) MEDICAL NECESSITY: I certify, that based on my findings, NURSING services are a medically Yaklin, [name] ([id number]) necessary home health service. HOME BOUND STATUS: I certify that my clinical findings support that this patient is homebound (i.e., Due to illness or injury, pt requires aid of supportive devices such as crutches, cane, wheelchairs, walkers, the use of special transportation or the assistance of another person to leave their place of residence. There is a normal inability to leave the home and doing so requires considerable and taxing effort. Other absences are for medical reasons / religious services and are infrequent or of short duration when for other reasons). o If current dressing causes regression in wound condition, may D/C ordered dressing product/s and apply Normal Saline Moist Dressing daily until next Wound Healing Center / Other MD appointment. Notify Wound Healing Center of regression in wound condition at [phone]. o Please direct any NON-WOUND related issues/requests for orders to patient's Primary Care Physician Wound #4 Right,Circumferential Foot o [facility]  [facility] ******Home Health to change wraps Tuesday Pt is seen in clinic on Friday and is changed then****** o Home Health Nurse may visit PRN to address patientos wound care needs. o FACE TO FACE ENCOUNTER: MEDICARE and MEDICAID PATIENTS: I certify that this patient is under my care and that I had a face-to-face encounter that meets the physician face-to-face encounter requirements with this patient on this date. The  encounter with the patient was in whole or in part for the following MEDICAL CONDITION: (primary reason for Home Healthcare) MEDICAL NECESSITY: I certify, that based on my findings, NURSING services are a medically necessary home health service. HOME BOUND STATUS: I certify that my clinical findings support that this patient is homebound (i.e., Due to illness or injury, pt requires aid of supportive devices such as crutches, cane, wheelchairs, walkers, the use of special transportation or the assistance of another person to leave their place of residence. There is a normal inability to leave the home and doing so requires considerable and taxing effort. Other absences are for medical reasons / religious services and are infrequent or of short duration when for other reasons). o If current dressing causes regression in wound condition, may D/C ordered dressing product/s and apply Normal Saline Moist Dressing daily until next Wound Healing Center / Other MD appointment. Notify Wound Healing Center of regression in wound condition at [phone]. o Please direct any NON-WOUND related issues/requests for orders to patient's Primary Care Physician Electronic Signature(s) Signed: 05/28/2016 4:58:26 PM By: Evlyn Kanner MD, FACS Signed: 05/28/2016 5:42:36 PM By: Alejandro Mulling Entered By: Alejandro Mulling on 05/28/2016 16:32:24 Riquelme, [name] ([id number]) -------------------------------------------------------------------------------- Problem List  Details Patient Name: [name] Date of Service: 05/28/2016 3:45 PM Medical Record Number: [id number] Patient Account Number: [id number] Date of Birth/Sex: [birth year] (55 y.o. 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 Male) Treating RN: Phillis Haggis Primary Care Physician: Corky Downs Other Clinician: Referring Physician: Corky Downs Treating Physician/Extender: Rudene Re in Treatment: 5 Subjective Chief Complaint Information obtained from Patient Patient presents to the wound care center for a consult due non healing wound to both lower extremities and accompanied by swelling and this has been worse for the last 5 months. He says the swelling of both lower extremity has been there for at least 6-7 years. History of Present Illness (HPI) The following HPI elements were documented for the patient's wound: Location:  ulceration and weeping of both lower extremities left more than the right Quality: Patient reports experiencing heaviness to affected area(s). Severity: Patient states wound are getting worse. Duration: Patient has had the wound for > 5 months prior to seeking treatment at the wound center Timing: Pain in wound is Intermittent (comes and goes Context: The wound would happen gradually Modifying Factors: Other treatment(s) tried include:has been having weekly wraps applied to both lower extremities at Dr. Zannie Kehr office Associated Signs and Symptoms: Patient reports having increase swelling. 55 year old gentleman who has been treated in the past for venous ulcers of the lower extremity is also known to be a smoker for the last 20 years and smokes about a pack of cigarettes a day. He has been seen by Dr. Evette Cristal who  has been treating left lower extremity venous ulcer with an Unna boot and have consulted vascular surgery to be seen by Dr. Gilda Crease. the patient was also recently admitted to the hospital on 04/17/2016 and discharged on 04/19/2016 with bleeding, hyponatremia, cellulitis of the right leg, laceration of the right foot. 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 He was admitted to hospital with significant bleeding from the ulcer and initially started on IV antibiotics for underlying cellulitis which was suspected. Patient was seen by Dr. Evie Lacks of surgery and Dr Alberteen Spindle of podiatry. Dr. Graciela Husbands had done excisional debridement of some of the superficial skin slough as well as the ulcerative area on the posterior aspect of the left ankle region. Unna's boots was applied. He was asked to follow-up at the wound center. He is on Xarelto for his chronic atrial fibrillation. 04/30/2016 -- the patient was seen by the PA at the vein and vascular practice who did a consultation but did not have any investigations  ordered or done. The patient continues to have a lot of oozing from his wounds. Novosel, [name] ([id number]) Objective Constitutional Pulse regular. Respirations normal and unlabored. Afebrile. Vitals Time Taken: 4:06 PM, Height: 76 in, Weight: 320 lbs, BMI: 38.9, Temperature: 98.2 F, Pulse: 72 bpm, Respiratory Rate: 20 breaths/min, Blood Pressure: 136/93 mmHg. Eyes Nonicteric. Reactive to light. Ears, Nose, Mouth, and Throat Lips, teeth, and gums WNL.Marland Kitchen Moist mucosa without lesions. Neck supple and nontender. No palpable supraclavicular or cervical adenopathy. Normal sized without goiter. Respiratory WNL. No retractions.. Cardiovascular Pedal Pulses WNL. No clubbing, cyanosis or edema. Gastrointestinal (GI) Abdomen without masses or tenderness.. No liver or spleen enlargement or tenderness.. Genitourinary (GU) No hydrocele, spermatocele, tenderness of the cord, or testicular mass.Marland Kitchen Penis without lesions.Renetta Chalk without lesions. No cystocele, or rectocele. Pelvic support intact, no discharge.Marland Kitchen Urethra without masses, tenderness or scarring.Marland Kitchen Lymphatic No adneopathy. No adenopathy. No adenopathy. Musculoskeletal Adexa without tenderness or enlargement.. Digits and nails w/o clubbing, cyanosis, infection, petechiae, ischemia, or inflammatory conditions.Marland Kitchen Psychiatric Judgement and insight Intact.. No evidence of depression, anxiety, or agitation.. General Notes: there is much improvement in his lymphedema but some of it is still brawny and persistent. The ulceration on the right leg is completely healed and on the left leg anteriorly I will use a spot of sample over the wound to help clear the subcutaneous debris which is fibrotic. The posterior ulceration on his ankle area is clean and we would use silver alginate. Witman, [name] ([id number]) Integumentary (Hair, Skin) No suspicious lesions. No crepitus or fluctuance. No peri-wound warmth or erythema. No masses.. Wound  #1 status is Open. Original cause of wound was Gradually Appeared. The wound is located on the Right,Circumferential Lower Leg. The wound measures 8cm length x 8cm width x 0.1cm depth; 50.265cm^2 area and 5.027cm^3 volume. The wound is limited to skin breakdown. There is no tunneling or undermining noted. There is a large amount of serous drainage noted. The wound margin is flat and intact. There is large (67-100%) red granulation within the wound bed. There is a small (1-33%) amount of necrotic tissue within the wound bed including Adherent Slough. The periwound skin appearance exhibited: Localized Edema, Maceration, Moist, Erythema. The surrounding wound skin color is noted with erythema which is circumferential. Periwound temperature was noted as No Abnormality. The periwound has tenderness on palpation. Wound #2 status is Open. Original cause of wound was Gradually Appeared. The wound is located on the  Left,Circumferential Lower Leg. The wound measures 14cm length x 28cm width x 0.2cm depth; 307.876cm^2 area and 61.575cm^3 volume. The wound is limited to skin breakdown. There is no tunneling or undermining noted. There is a large amount of serous drainage noted. The wound margin is flat and intact. There is large (67-100%) red, pink granulation within the wound bed. There is a small (1-33%) amount of necrotic tissue within the wound bed including Adherent Slough. The periwound skin appearance exhibited: Localized Edema, Erythema. The surrounding wound skin color is noted with erythema which is circumferential. Periwound temperature was noted as No Abnormality. The periwound has tenderness on palpation. Wound #3 status is Open. Original cause of wound was Gradually Appeared. The wound is located on the Left,Circumferential Foot. The wound measures 4cm length x 14cm width x 0.1cm depth; 43.982cm^2 area and 4.398cm^3 volume. The wound is limited to skin breakdown. There is no tunneling or  undermining noted. There is a large amount of serous drainage noted. The wound margin is flat and intact. There is large (67-100%) red, pink granulation within the wound bed. There is no necrotic tissue within the wound bed. The periwound skin appearance exhibited: Localized Edema, Maceration, Moist, Erythema. The surrounding wound skin color is noted with erythema which is circumferential. Periwound temperature was noted as No Abnormality. The periwound has tenderness on palpation. Wound #4 status is Open. Original cause of wound was Gradually Appeared. The wound is located on the Right,Circumferential Foot. The wound measures 5cm length x 12cm width x 0.1cm depth; 47.124cm^2 area and 4.712cm^3 volume. The wound is limited to skin breakdown. There is no tunneling or undermining noted. There is a large amount of serous drainage noted. The wound margin is flat and intact. There is large (67-100%) red, pink granulation within the wound bed. There is no necrotic tissue within the wound bed. The periwound skin appearance exhibited: Localized Edema, Maceration, Moist, Erythema. The surrounding wound skin color is noted with erythema which is circumferential. Periwound temperature was noted as No Abnormality. The periwound has tenderness on palpation. 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 F17.218 - Nicotine dependence, cigarettes, with other nicotine-induced disorders Plan Wound Cleansing: Wound #1 Right,Circumferential Lower Leg: Cleanse wound with mild soap and water - when changing the wrap Wound #2 Left,Circumferential Lower Leg: Cleanse wound with mild soap and water - when changing the wrap Wound #3 Left,Circumferential Foot: Cleanse wound with  mild soap and water - when changing the wrap Wound #4 Right,Circumferential Foot: Cleanse wound with mild soap and water - when changing the wrap Anesthetic: Wound #3 Left,Circumferential Foot: Topical Lidocaine 4% cream applied to wound bed prior to debridement - for office use only Skin Barriers/Peri-Wound Care: Wound #1 Right,Circumferential Lower Leg: Barrier cream Wound #2 Left,Circumferential Lower Leg: Barrier cream Wound #3 Left,Circumferential Foot: Barrier cream - around the wounds Wound #4 Right,Circumferential Foot: Barrier cream - around the wounds Primary Wound Dressing: Wound #1 Right,Circumferential Lower Leg: Aquacel Ag Wound #2 Left,Circumferential Lower Leg: Santyl Ointment - ONLY FOR CLINIC***place on anterior lower leg*** one time order Aquacel Ag Wound #3 Left,Circumferential Foot: Aquacel Ag Wound #4 Right,Circumferential Foot: Aquacel Ag Secondary Dressing: Wound #1 Right,Circumferential Lower Leg: ABD pad Wound #2 Left,Circumferential Lower Leg: ABD pad Wound #3 Left,Circumferential Foot: ABD pad Oberry, Khalin J. ([id number]) Wound #4 Right,Circumferential Foot: ABD  pad Dressing Change Frequency: Wound #1 Right,Circumferential Lower Leg: Other: - Home Health to change wraps Tuesday Pt is seen in clinic on Friday and is changed then Wound #2 Left,Circumferential Lower Leg: Other: - Home Health to change wraps Tuesday Pt is seen in clinic on Friday and is changed then Wound #3 Left,Circumferential Foot: Other: - Home Health to change wraps Tuesday Pt is seen in clinic on Friday and is changed then Wound #4 Right,Circumferential Foot: Other: - Home Health to change wraps Tuesday Pt is seen in clinic on Friday and is changed then Follow-up Appointments: Wound #1 Right,Circumferential Lower Leg: Return Appointment in 1 week. Wound #2 Left,Circumferential Lower Leg: Return Appointment in 1 week. Wound #3 Left,Circumferential Foot: Return  Appointment in 1 week. Wound #4 Right,Circumferential Foot: Return Appointment in 1 week. Edema Control: Wound #1 Right,Circumferential Lower Leg: 4 Layer Compression System - Bilateral - Home Health to change only on Tuesdays unna to anchor 3cm from knee and 3 cm from toes Elevate legs to the level of the heart and pump ankles as often as possible Wound #2 Left,Circumferential Lower Leg: 4 Layer Compression System - Bilateral - Home Health to change only on Tuesdays unna to anchor 3cm from knee and 3 cm from toes Elevate legs to the level of the heart and pump ankles as often as possible Wound #3 Left,Circumferential Foot: 4 Layer Compression System - Bilateral - Home Health to change only on Tuesdays unna to anchor 3cm from knee and 3 cm from toes Elevate legs to the level of the heart and pump ankles as often as possible Wound #4 Right,Circumferential Foot: 4 Layer Compression System - Bilateral - Home Health to change only on Tuesdays unna to anchor 3cm from knee and 3 cm from toes Elevate legs to the level of the heart and pump ankles as often as possible Additional Orders / Instructions: Wound #1 Right,Circumferential Lower Leg: Stop Smoking Increase protein intake. Wound #2 Left,Circumferential Lower Leg: Stop Smoking Increase protein intake. Wound #3 Left,Circumferential Foot: Stop Smoking Increase protein intake. Wound #4 Right,Circumferential Foot: Stop Smoking Increase protein intake. Home Health: JANARI, YAMADA ([id number]) Wound #1 Right,Circumferential Lower Leg: Continue Home Health Visits - [facility] ******Home Health to change wraps Tuesday Pt is seen in clinic on Friday and is changed then****** Home Health Nurse may visit PRN to address patient s wound care needs. FACE TO FACE ENCOUNTER: MEDICARE and MEDICAID PATIENTS: I certify that this patient is under my care and that I had a face-to-face encounter that meets the physician face-to-face  encounter requirements with this patient on this date. The encounter with the patient was in whole or in part for the following MEDICAL CONDITION: (primary reason for Home Healthcare) MEDICAL NECESSITY: I certify, that based on my findings, NURSING services are a medically necessary home health service. HOME BOUND STATUS: I certify that my clinical findings support that this patient is homebound (i.e., Due to illness or injury, pt requires aid of supportive devices such as crutches, cane, wheelchairs, walkers, the use of special transportation or the assistance of another person to leave their place of residence. There is a normal inability to leave the home and doing so requires considerable and taxing effort. Other absences are for medical reasons / religious services and are infrequent or of short duration when for other reasons). If current dressing causes regression in wound condition, may D/C ordered dressing product/s and apply Normal Saline Moist Dressing daily until next Wound Healing Center / Other MD  appointment. Notify Wound Healing Center of regression in wound condition at [phone]. Please direct any NON-WOUND related issues/requests for orders to patient's Primary Care Physician Wound #2 Left,Circumferential Lower Leg: Continue Home Health Visits - [facility] ******Home Health to change wraps Tuesday Pt is seen in clinic on Friday and is changed then****** Home Health Nurse may visit PRN to address patient s wound care needs. FACE TO FACE ENCOUNTER: MEDICARE and MEDICAID PATIENTS: I certify that this patient is under my care and that I had a face-to-face encounter that meets the physician face-to-face encounter requirements with this patient on this date. The encounter with the patient was in whole or in part for the following MEDICAL CONDITION: (primary reason for Home Healthcare) MEDICAL NECESSITY: I certify, that based on my findings, NURSING services are a medically  necessary home health service. HOME BOUND STATUS: I certify that my clinical findings support that this patient is homebound (i.e., Due to illness or injury, pt requires aid of supportive devices such as crutches, cane, wheelchairs, walkers, the use of special transportation or the assistance of another person to leave their place of residence. There is a normal inability to leave the home and doing so requires considerable and taxing effort. Other absences are for medical reasons / religious services and are infrequent or of short duration when for other reasons). If current dressing causes regression in wound condition, may D/C ordered dressing product/s and apply Normal Saline Moist Dressing daily until next Wound Healing Center / Other MD appointment. Notify Wound Healing Center of regression in wound condition at [phone]. Please direct any NON-WOUND related issues/requests for orders to patient's Primary Care Physician Wound #3 Left,Circumferential Foot: Continue Home Health Visits - [facility] ******Home Health to change wraps Tuesday Pt is seen in clinic on Friday and is changed then****** Home Health Nurse may visit PRN to address patient s wound care needs. FACE TO FACE ENCOUNTER: MEDICARE and MEDICAID PATIENTS: I certify that this patient is under my care and that I had a face-to-face encounter that meets the physician face-to-face encounter requirements with this patient on this date. The encounter with the patient was in whole or in part for the following MEDICAL CONDITION: (primary reason for Home Healthcare) MEDICAL NECESSITY: I certify, that based on my findings, NURSING services are a medically necessary home health service. HOME BOUND STATUS: I certify that my clinical findings support that this patient is homebound (i.e., Due to illness or injury, pt requires aid of supportive devices such as crutches, cane, wheelchairs, walkers, the use of special transportation or the  assistance of another person to leave their place of residence. There is a normal inability to leave the home and doing so requires considerable and taxing effort. Other absences are for medical reasons / religious services and are infrequent or of short duration when for other reasons). If current dressing causes regression in wound condition, may D/C ordered dressing product/s and apply Denise, Landon J. ([id number]) Normal Saline Moist Dressing daily until next Wound Healing Center / Other MD appointment. Notify Wound Healing Center of regression in wound condition at [phone]. Please direct any NON-WOUND related issues/requests for orders to patient's Primary Care Physician Wound #4 Right,Circumferential Foot: Continue Home Health Visits - [facility] ******Home Health to change wraps Tuesday Pt is seen in clinic on Friday and is changed then****** Home Health Nurse may visit PRN to address patient s wound care needs. FACE TO FACE ENCOUNTER: MEDICARE and MEDICAID PATIENTS: I certify that this patient is under my  care and that I had a face-to-face encounter that meets the physician face-to-face encounter requirements with this patient on this date. The encounter with the patient was in whole or in part for the following MEDICAL CONDITION: (primary reason for Home Healthcare) MEDICAL NECESSITY: I certify, that based on my findings, NURSING services are a medically necessary home health service. HOME BOUND STATUS: I certify that my clinical findings support that this patient is homebound (i.e., Due to illness or injury, pt requires aid of supportive devices such as crutches, cane, wheelchairs, walkers, the use of special transportation or the assistance of another person to leave their place of residence. There is a normal inability to leave the home and doing so requires considerable and taxing effort. Other absences are for medical reasons / religious services and are infrequent or of short  duration when for other reasons). If current dressing causes regression in wound condition, may D/C ordered dressing product/s and apply Normal Saline Moist Dressing daily until next Wound Healing Center / Other MD appointment. Notify Wound Healing Center of regression in wound condition at [phone]. Please direct any NON-WOUND related issues/requests for orders to patient's Primary Care Physician I have discussed with him that he may need to talk to his PCP regarding putting him on a diuretic he tells me the appointment is on Monday Besides encouraging elevation and exercise I have recommended Aquacel Ag with a 4-layer Profore wrap and he will come regularly for wound care visits. His dressing is being changed to twice a week. Electronic Signature(s) Signed: 05/28/2016 4:33:35 PM By: Evlyn Kanner MD, FACS Entered By: Evlyn Kanner on 05/28/2016 16:33:35 Miotke, [name] ([id number]) -------------------------------------------------------------------------------- SuperBill Details Patient Name: [name] Date of Service: 05/28/2016 Medical Record Number: [id number] Patient Account Number: [id number] Date of Birth/Sex: [birth year] (55 y.o. 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

## 2016-05-29 NOTE — Progress Notes (Signed)
AREG, BIALAS ([id number]) Visit Report for 05/28/2016 Arrival Information Details Patient Name: [name], [name] Date of Service: 05/28/2016 3:45 PM Medical Record Number: [id number] Patient Account Number: [id number] Date of Birth/Sex: 07-17-1961 (55 y.o. Male) Treating RN: Phillis Haggis Primary Care Physician: Corky Downs Other Clinician: Referring Physician: Corky Downs Treating Physician/Extender: Rudene Re in Treatment: 5 Visit Information History Since Last Visit All ordered tests and consults were completed: No Patient Arrived: Ambulatory Added or deleted any medications: No Arrival Time: 16:04 Any new allergies or adverse reactions: No Accompanied By: girlfriend Had a fall or experienced change in No Transfer Assistance: None activities of daily living that may affect Patient Identification Verified: Yes risk of falls: Secondary Verification Process Yes Signs or symptoms of abuse/neglect since last No Completed: visito Patient Requires Transmission- No Hospitalized since last visit: No Based Precautions: Pain Present Now: No Patient Has Alerts: Yes Patient Alerts: Patient on Blood Thinner [name] Electronic Signature(s) Signed: 05/28/2016 5:42:36 PM By: Alejandro Mulling Entered By: Alejandro Mulling on 05/28/2016 16:05:36 Kerby, [name] ([id number]) -------------------------------------------------------------------------------- Encounter Discharge Information Details Patient Name: [name] Date of Service: 05/28/2016 3:45 PM Medical Record Number: [id number] Patient Account Number: [id number] Date of Birth/Sex: 1961-03-27 (55 y.o. Male) Treating RN: Phillis Haggis Primary Care Physician: Corky Downs Other Clinician: Referring Physician: Corky Downs Treating Physician/Extender: Rudene Re in Treatment: 5 Encounter Discharge Information Items Discharge Pain Level: 0 Discharge Condition: Stable Ambulatory  Status: Ambulatory Discharge Destination: Home Transportation: Private Auto Accompanied By: girlfriend Schedule Follow-up Appointment: Yes Medication Reconciliation completed and provided to Patient/Care Yes [name] Bove: Provided on Clinical Summary of Care: 05/28/2016 Form Type Recipient Paper Patient MD Electronic Signature(s) Signed: 05/28/2016 4:58:31 PM By: Gwenlyn Perking Entered By: Gwenlyn Perking on 05/28/2016 16:58:31 Oxendine, [name] ([id number]) -------------------------------------------------------------------------------- Lower Extremity Assessment Details Patient Name: [name] Date of Service: 05/28/2016 3:45 PM Medical Record Number: [id number] Patient Account Number: [id number] Date of Birth/Sex: 1961/09/27 (55 y.o. Male) Treating RN: Ashok Cordia, Debi Primary Care Physician: Corky Downs Other Clinician: Referring Physician: Corky Downs Treating Physician/Extender: Rudene Re in Treatment: 5 Edema Assessment Assessed: [Left: No] [Right: No] E[Left: dema] [Right: :] Calf Left: Right: Point of Measurement: 38 cm From Medial Instep 49.2 cm 50.8 cm Ankle Left: Right: Point of Measurement: 13 cm From Medial Instep 29 cm 28 cm Vascular Assessment Pulses: Posterior Tibial Dorsalis Pedis Palpable: [Left:Yes] [Right:Yes] Extremity colors, hair growth, and conditions: Extremity Color: [Left:Hyperpigmented] [Right:Hyperpigmented] Temperature of Extremity: [Left:Warm] [Right:Warm] Capillary Refill: [Left:< 3 seconds] [Right:< 3 seconds] Toe Nail Assessment Left: Right: Thick: Yes Yes Discolored: Yes Yes Deformed: No No Improper Length and Hygiene: No No Electronic Signature(s) Signed: 05/28/2016 5:42:36 PM By: Alejandro Mulling Entered By: Alejandro Mulling on 05/28/2016 16:20:50 Bogacz, [name] ([id number]) -------------------------------------------------------------------------------- Multi Wound Chart Details Patient Name: [name] Date of Service: 05/28/2016 3:45 PM Medical Record Number: [id number] Patient Account Number: [id number] Date of Birth/Sex: 11/06/1961 (54 y.o. Male) Treating RN: Phillis Haggis Primary Care Physician: Corky Downs Other Clinician: Referring Physician: Corky Downs Treating Physician/Extender: Rudene Re in Treatment: 5 Vital Signs Height(in): 76 Pulse(bpm): 72 Weight(lbs): 320 Blood Pressure 136/93 (mmHg): Body Mass Index(BMI): 39 Temperature(F): 98.2 Respiratory Rate 20 (breaths/min): Photos: [1:No Photos] [2:No Photos] [3:No Photos] Wound Location: [1:Right Lower Leg - Circumfernential] [2:Left Lower Leg - Circumfernential] [3:Left Foot - Circumfernential] Wounding Event: [1:Gradually Appeared] [2:Gradually Appeared] [3:Gradually Appeared] Primary Etiology: [1:Venous Leg Ulcer] [2:Venous Leg Ulcer] [3:Vasculitis] Comorbid History: [1:Arrhythmia, Hypertension] [2:Arrhythmia, Hypertension] [3:Arrhythmia, Hypertension] Date Acquired: [  1:12/14/2015] [2:12/14/2015] [3:12/14/2015] Weeks of Treatment: [1:5] [2:5] [3:5] Wound Status: [1:Open] [2:Open] [3:Open] Measurements L x W x D 8x8x0.1 [2:14x28x0.2] [3:4x14x0.1] (cm) Area (cm) : [1:50.265] [2:307.876] [3:43.982] Volume (cm) : [1:5.027] [2:61.575] [3:4.398] % Reduction in Area: [1:85.50%] [2:32.60%] [3:77.00%] % Reduction in Volume: 85.50% [2:-34.80%] [3:77.00%] Classification: [1:Partial Thickness] [2:Partial Thickness] [3:Partial Thickness] Exudate Amount: [1:Large] [2:Large] [3:Large] Exudate Type: [1:Serous] [2:Serous] [3:Serous] Exudate Color: [1:amber] [2:amber] [3:amber] Wound Margin: [1:Flat and Intact] [2:Flat and Intact] [3:Flat and Intact] Granulation Amount: [1:Large (67-100%)] [2:Large (67-100%)] [3:Large (67-100%)] Granulation Quality: [1:Red] [2:Red, Pink] [3:Red, Pink] Necrotic Amount: [1:Small (1-33%)] [2:Small (1-33%)] [3:None Present (0%)] Exposed Structures: [1:Fascia: No Fat: No  Tendon: No Muscle: No Joint: No Bone: No] [2:Fascia: No Fat: No Tendon: No Muscle: No Joint: No Bone: No] [3:Fascia: No Fat: No Tendon: No Muscle: No Joint: No Bone: No] Limited to Skin Limited to Skin Limited to Skin Breakdown Breakdown Breakdown Epithelialization: None None Medium (34-66%) Periwound Skin Texture: Edema: Yes Edema: Yes Edema: Yes Periwound Skin Maceration: Yes No Abnormalities Noted Maceration: Yes Moisture: Moist: Yes Moist: Yes Periwound Skin Color: Erythema: Yes Erythema: Yes Erythema: Yes Erythema Location: Circumferential Circumferential Circumferential Temperature: No Abnormality No Abnormality No Abnormality Tenderness on Yes Yes Yes Palpation: Wound Preparation: Ulcer Cleansing: Other: Ulcer Cleansing: Other: Ulcer Cleansing: Other: soap and water soap and water soap and water Topical Anesthetic Topical Anesthetic Applied: Other: lidocaine Applied: None % Wound Number: 4 N/A N/A Photos: No Photos N/A N/A Wound Location: Right Foot - N/A N/A Circumfernential Wounding Event: Gradually Appeared N/A N/A Primary Etiology: Vasculitis N/A N/A Comorbid History: Arrhythmia, Hypertension N/A N/A Date Acquired: 12/14/2015 N/A N/A Weeks of Treatment: 5 N/A N/A Wound Status: Open N/A N/A Measurements L x W x D 5x12x0.1 N/A N/A (cm) Area (cm) : 47.124 N/A N/A Volume (cm) : 4.712 N/A N/A % Reduction in Area: 78.60% N/A N/A % Reduction in Volume: 78.60% N/A N/A Classification: Partial Thickness N/A N/A Exudate Amount: Large N/A N/A Exudate Type: Serous N/A N/A Exudate Color: amber N/A N/A Wound Margin: Flat and Intact N/A N/A Granulation Amount: Large (67-100%) N/A N/A Granulation Quality: Red, Pink N/A N/A Necrotic Amount: None Present (0%) N/A N/A Exposed Structures: Fascia: No N/A N/A Fat: No Tendon: No Muscle: No Joint: No Bone: No [name], [name] J. ([id number]) Limited to Skin Breakdown Epithelialization: Medium (34-66%) N/A N/A Periwound Skin  Texture: Edema: Yes N/A N/A Periwound Skin Maceration: Yes N/A N/A Moisture: Moist: Yes Periwound Skin Color: Erythema: Yes N/A N/A Erythema Location: Circumferential N/A N/A Temperature: No Abnormality N/A N/A Tenderness on Yes N/A N/A Palpation: Wound Preparation: Ulcer Cleansing: Other: N/A N/A soap and water Topical Anesthetic Applied: None Treatment Notes Electronic Signature(s) Signed: 05/28/2016 5:42:36 PM By: Alejandro Mulling Entered By: Alejandro Mulling on 05/28/2016 16:25:34 [name], [name] ([id number]) -------------------------------------------------------------------------------- Multi-Disciplinary Care Plan Details Patient Name: [name] Date of Service: 05/28/2016 3:45 PM Medical Record Number: [id number] Patient Account Number: [id number] Date of Birth/Sex: Nov 18, 1961 (55 y.o. Male) Treating RN: Phillis Haggis Primary Care Physician: Corky Downs Other Clinician: Referring Physician: Corky Downs Treating Physician/Extender: Rudene Re in Treatment: 5 Active Inactive Orientation to the Wound Care Program Nursing Diagnoses: Knowledge deficit related to the wound healing center program Goals: Patient/caregiver will verbalize understanding of the Wound Healing Center Program Date Initiated: 04/23/2016 Goal Status: Active Interventions: Provide education on orientation to the wound center Notes: Pain, Acute or Chronic Nursing Diagnoses: Pain, acute or chronic: actual or potential Potential alteration in comfort, pain Goals:  Patient will verbalize adequate pain control and receive pain control interventions during procedures as needed Date Initiated: 04/23/2016 Goal Status: Active Patient/caregiver will verbalize adequate pain control between visits Date Initiated: 04/23/2016 Goal Status: Active Interventions: Assess comfort goal upon admission Complete pain assessment as per visit requirements Notes: Wound/Skin  Impairment [name], [name] ([id number]) Nursing Diagnoses: Impaired tissue integrity Knowledge deficit related to smoking impact on wound healing Knowledge deficit related to ulceration/compromised skin integrity Goals: Ulcer/skin breakdown will have a volume reduction of 30% by week 4 Date Initiated: 04/23/2016 Goal Status: Active Ulcer/skin breakdown will have a volume reduction of 50% by week 8 Date Initiated: 04/23/2016 Goal Status: Active Ulcer/skin breakdown will have a volume reduction of 80% by week 12 Date Initiated: 04/23/2016 Goal Status: Active Interventions: Assess ulceration(s) every visit Notes: Electronic Signature(s) Signed: 05/28/2016 5:42:36 PM By: Alejandro Mulling Entered By: Alejandro Mulling on 05/28/2016 16:25:29 [name], [name] ([id number]) -------------------------------------------------------------------------------- Pain Assessment Details Patient Name: [name] Date of Service: 05/28/2016 3:45 PM Medical Record Number: [id number] Patient Account Number: [id number] Date of Birth/Sex: Jul 19, 1961 (55 y.o. Male) Treating RN: Phillis Haggis Primary Care Physician: Corky Downs Other Clinician: Referring Physician: Corky Downs Treating Physician/Extender: Rudene Re in Treatment: 5 Active Problems Location of Pain Severity and Description of Pain Patient Has Paino No Site Locations Pain Management and Medication Current Pain Management: Electronic Signature(s) Signed: 05/28/2016 5:42:36 PM By: Alejandro Mulling Entered By: Alejandro Mulling on 05/28/2016 16:05:41 Jacober, [name] ([id number]) -------------------------------------------------------------------------------- Patient/Caregiver Education Details Patient Name: [name] Date of Service: 05/28/2016 3:45 PM Medical Record Number: [id number] Patient Account Number: [id number] Date of Birth/Gender: 1961-09-27 (55 y.o. Male) Treating RN: Phillis Haggis Primary Care Physician: Corky Downs Other Clinician: Referring Physician: Corky Downs Treating Physician/Extender: Rudene Re in Treatment: 5 Education Assessment Education Provided To: Patient Education Topics Provided Wound/Skin Impairment: Handouts: Other: do not get wraps wet Methods: Demonstration, Explain/Verbal Responses: State content correctly Electronic Signature(s) Signed: 05/28/2016 5:42:36 PM By: Alejandro Mulling Entered By: Alejandro Mulling on 05/28/2016 16:33:58 Bohman, [name] ([id number]) -------------------------------------------------------------------------------- Wound Assessment Details Patient Name: [name] Date of Service: 05/28/2016 3:45 PM Medical Record Number: [id number] Patient Account Number: [id number] Date of Birth/Sex: 1961-07-08 (55 y.o. Male) Treating RN: Phillis Haggis Primary Care Physician: Corky Downs Other Clinician: Referring Physician: Corky Downs Treating Physician/Extender: Rudene Re in Treatment: 5 Wound Status Wound Number: 1 Primary Etiology: Venous Leg Ulcer Wound Location: Right Lower Leg - Wound Status: Open Circumfernential Comorbid History: Arrhythmia, Hypertension Wounding Event: Gradually Appeared Date Acquired: 12/14/2015 Weeks Of Treatment: 5 Clustered Wound: No Photos Photo Uploaded By: Alejandro Mulling on 05/28/2016 17:21:47 Wound Measurements Length: (cm) 8 Width: (cm) 8 Depth: (cm) 0.1 Area: (cm) 50.265 Volume: (cm) 5.027 % Reduction in Area: 85.5% % Reduction in Volume: 85.5% Epithelialization: None Tunneling: No Undermining: No Wound Description Classification: Partial Thickness Wound Margin: Flat and Intact Exudate Amount: Large Exudate Type: Serous Exudate Color: amber Foul Odor After Cleansing: No Wound Bed Granulation Amount: Large (67-100%) Exposed Structure Granulation Quality: Red Fascia Exposed: No Necrotic Amount: Small (1-33%) Fat  Layer Exposed: No [name], [name] J. ([id number]) Necrotic Quality: Adherent Slough Tendon Exposed: No Muscle Exposed: No Joint Exposed: No Bone Exposed: No Limited to Skin Breakdown Periwound Skin Texture Texture Color No Abnormalities Noted: No No Abnormalities Noted: No Localized Edema: Yes Erythema: Yes Erythema Location: Circumferential Moisture No Abnormalities Noted: No Temperature / Pain Maceration: Yes Temperature: No Abnormality Moist: Yes Tenderness on Palpation: Yes Wound Preparation Ulcer  Cleansing: Other: soap and water, Treatment Notes Wound #1 (Right, Circumferential Lower Leg) 1. Cleansed with: Cleanse wound with antibacterial soap and water 2. Anesthetic Topical Lidocaine 4% cream to wound bed prior to debridement 3. Peri-wound Care: Barrier cream 4. Dressing Applied: Aquacel Ag 5. Secondary Dressing Applied ABD Pad 7. Secured with Tape 4 Layer Compression System - Bilateral Notes unna to anchor Electronic Signature(s) Signed: 05/28/2016 5:42:36 PM By: Alejandro MullingPinkerton, Debra Entered By: Alejandro MullingPinkerton, Debra on 05/28/2016 16:22:58 [name], [name] ([id number]) -------------------------------------------------------------------------------- Wound Assessment Details Patient Name: [name] RouteAMERON, Marchello J. Date of Service: 05/28/2016 3:45 PM Medical Record Number: [id number] Patient Account Number: [id number] Date of Birth/Sex: [birth year] 5(54 y.o. Male) Treating RN: Phillis HaggisPinkerton, Debi Primary Care Physician: Corky DownsMASOUD, [name] Other Clinician: Referring Physician: Corky DownsMASOUD, [name] Treating Physician/Extender: Rudene ReBritto, Errol Weeks in Treatment: 5 Wound Status Wound Number: 2 Primary Etiology: Venous Leg Ulcer Wound Location: Left Lower Leg - Wound Status: Open Circumfernential Comorbid History: Arrhythmia, Hypertension Wounding Event: Gradually Appeared Date Acquired: 12/14/2015 Weeks Of Treatment: 5 Clustered Wound: No Photos Photo Uploaded By: Alejandro MullingPinkerton, Debra on  05/28/2016 17:21:47 Wound Measurements Length: (cm) 14 Width: (cm) 28 Depth: (cm) 0.2 Area: (cm) 307.876 Volume: (cm) 61.575 % Reduction in Area: 32.6% % Reduction in Volume: -34.8% Epithelialization: None Tunneling: No Undermining: No Wound Description Classification: Partial Thickness Wound Margin: Flat and Intact Exudate Amount: Large Exudate Type: Serous Exudate Color: amber Wound Bed Granulation Amount: Large (67-100%) Exposed Structure Granulation Quality: Red, Pink Fascia Exposed: No Necrotic Amount: Small (1-33%) Fat Layer Exposed: No Kory, Bastien J. ([id number]) Necrotic Quality: Adherent Slough Tendon Exposed: No Muscle Exposed: No Joint Exposed: No Bone Exposed: No Limited to Skin Breakdown Periwound Skin Texture Texture Color No Abnormalities Noted: No No Abnormalities Noted: No Localized Edema: Yes Erythema: Yes Erythema Location: Circumferential Moisture No Abnormalities Noted: No Temperature / Pain Temperature: No Abnormality Tenderness on Palpation: Yes Wound Preparation Ulcer Cleansing: Other: soap and water, Topical Anesthetic Applied: Other: lidocaine %, Treatment Notes Wound #2 (Left, Circumferential Lower Leg) 1. Cleansed with: Cleanse wound with antibacterial soap and water 2. Anesthetic Topical Lidocaine 4% cream to wound bed prior to debridement 3. Peri-wound Care: Barrier cream 4. Dressing Applied: Aquacel Ag Santyl Ointment 5. Secondary Dressing Applied ABD Pad 7. Secured with Tape 4 Layer Compression System - Bilateral Notes unna to anchor Electronic Signature(s) Signed: 05/28/2016 5:42:36 PM By: Alejandro MullingPinkerton, Debra Entered By: Alejandro MullingPinkerton, Debra on 05/28/2016 16:23:17 [name], [name] ([id number]) -------------------------------------------------------------------------------- Wound Assessment Details Patient Name: [name] RouteAMERON, Mekhi J. Date of Service: 05/28/2016 3:45 PM Medical Record Number: [id number] Patient  Account Number: [id number] Date of Birth/Sex: [birth year] 38(54 y.o. Male) Treating RN: Phillis HaggisPinkerton, Debi Primary Care Physician: Corky DownsMASOUD, [name] Other Clinician: Referring Physician: Corky DownsMASOUD, [name] Treating Physician/Extender: Rudene ReBritto, Errol Weeks in Treatment: 5 Wound Status Wound Number: 3 Primary Etiology: Vasculitis Wound Location: Left Foot - Circumfernential Wound Status: Open Wounding Event: Gradually Appeared Comorbid History: Arrhythmia, Hypertension Date Acquired: 12/14/2015 Weeks Of Treatment: 5 Clustered Wound: No Photos Photo Uploaded By: Alejandro MullingPinkerton, Debra on 05/28/2016 17:22:17 Wound Measurements Length: (cm) 4 Width: (cm) 14 Depth: (cm) 0.1 Area: (cm) 43.982 Volume: (cm) 4.398 % Reduction in Area: 77% % Reduction in Volume: 77% Epithelialization: Medium (34-66%) Tunneling: No Undermining: No Wound Description Classification: Partial Thickness Wound Margin: Flat and Intact Exudate Amount: Large Exudate Type: Serous Exudate Color: amber Foul Odor After Cleansing: No Wound Bed Granulation Amount: Large (67-100%) Exposed Structure Granulation Quality: Red, Pink Fascia Exposed: No Necrotic Amount: None Present (0%) Fat Layer Exposed: No Tendon Exposed: No  Barron, QUANTAVIOUS EGGERT ([id number]) Muscle Exposed: No Joint Exposed: No Bone Exposed: No Limited to Skin Breakdown Periwound Skin Texture Texture Color No Abnormalities Noted: No No Abnormalities Noted: No Localized Edema: Yes Erythema: Yes Erythema Location: Circumferential Moisture No Abnormalities Noted: No Temperature / Pain Maceration: Yes Temperature: No Abnormality Moist: Yes Tenderness on Palpation: Yes Wound Preparation Ulcer Cleansing: Other: soap and water, Topical Anesthetic Applied: None Treatment Notes Wound #3 (Left, Circumferential Foot) 1. Cleansed with: Cleanse wound with antibacterial soap and water 2. Anesthetic Topical Lidocaine 4% cream to wound bed prior to debridement 3.  Peri-wound Care: Barrier cream 4. Dressing Applied: Aquacel Ag 5. Secondary Dressing Applied ABD Pad 7. Secured with Tape 4 Layer Compression System - Bilateral Notes unna to anchor Electronic Signature(s) Signed: 05/28/2016 5:42:36 PM By: Alejandro Mulling Entered By: Alejandro Mulling on 05/28/2016 16:23:38 [name], [name] ([id number]) -------------------------------------------------------------------------------- Wound Assessment Details Patient Name: [name] Date of Service: 05/28/2016 3:45 PM Medical Record Number: [id number] Patient Account Number: [id number] Date of Birth/Sex: 02/27/1961 (55 y.o. Male) Treating RN: Phillis Haggis Primary Care Physician: Corky Downs Other Clinician: Referring Physician: Corky Downs Treating Physician/Extender: Rudene Re in Treatment: 5 Wound Status Wound Number: 4 Primary Etiology: Vasculitis Wound Location: Right Foot - Circumfernential Wound Status: Open Wounding Event: Gradually Appeared Comorbid History: Arrhythmia, Hypertension Date Acquired: 12/14/2015 Weeks Of Treatment: 5 Clustered Wound: No Photos Photo Uploaded By: Alejandro Mulling on 05/28/2016 17:22:17 Wound Measurements Length: (cm) 5 Width: (cm) 12 Depth: (cm) 0.1 Area: (cm) 47.124 Volume: (cm) 4.712 % Reduction in Area: 78.6% % Reduction in Volume: 78.6% Epithelialization: Medium (34-66%) Tunneling: No Undermining: No Wound Description Classification: Partial Thickness Wound Margin: Flat and Intact Exudate Amount: Large Exudate Type: Serous Exudate Color: amber Foul Odor After Cleansing: No Wound Bed Granulation Amount: Large (67-100%) Exposed Structure Granulation Quality: Red, Pink Fascia Exposed: No Necrotic Amount: None Present (0%) Fat Layer Exposed: No Tendon Exposed: No Domke, Ryett J. ([id number]) Muscle Exposed: No Joint Exposed: No Bone Exposed: No Limited to Skin Breakdown Periwound Skin Texture Texture  Color No Abnormalities Noted: No No Abnormalities Noted: No Localized Edema: Yes Erythema: Yes Erythema Location: Circumferential Moisture No Abnormalities Noted: No Temperature / Pain Maceration: Yes Temperature: No Abnormality Moist: Yes Tenderness on Palpation: Yes Wound Preparation Ulcer Cleansing: Other: soap and water, Topical Anesthetic Applied: None Treatment Notes Wound #4 (Right, Circumferential Foot) 1. Cleansed with: Cleanse wound with antibacterial soap and water 2. Anesthetic Topical Lidocaine 4% cream to wound bed prior to debridement 3. Peri-wound Care: Barrier cream 4. Dressing Applied: Aquacel Ag 5. Secondary Dressing Applied ABD Pad 7. Secured with Tape 4 Layer Compression System - Bilateral Notes unna to anchor Electronic Signature(s) Signed: 05/28/2016 5:42:36 PM By: Alejandro Mulling Entered By: Alejandro Mulling on 05/28/2016 16:23:58 Godsil, [name] ([id number]) -------------------------------------------------------------------------------- Vitals Details Patient Name: [name] Date of Service: 05/28/2016 3:45 PM Medical Record Number: [id number] Patient Account Number: [id number] Date of Birth/Sex: 06/19/1961 (55 y.o. Male) Treating RN: Phillis Haggis Primary Care Physician: Corky Downs Other Clinician: Referring Physician: Corky Downs Treating Physician/Extender: Rudene Re in Treatment: 5 Vital Signs Time Taken: 16:06 Temperature (F): 98.2 Height (in): 76 Pulse (bpm): 72 Weight (lbs): 320 Respiratory Rate (breaths/min): 20 Body Mass Index (BMI): 38.9 Blood Pressure (mmHg): 136/93 Reference Range: 80 - 120 mg / dl Electronic Signature(s) Signed: 05/28/2016 5:42:36 PM By: Alejandro Mulling Entered By: Alejandro Mulling on 05/28/2016 16:06:08

## 2016-06-04 ENCOUNTER — Encounter: Payer: BC Managed Care – PPO | Admitting: Surgery

## 2016-06-04 DIAGNOSIS — I890 Lymphedema, not elsewhere classified: Secondary | ICD-10-CM | POA: Diagnosis not present

## 2016-06-04 NOTE — Progress Notes (Addendum)
[name], [name] J. ([id number]) Visit Report for 06/04/2016 Chief Complaint Document Details Patient Name: [name], [name] J. Date of Service: 06/04/2016 3:00 PM Medical Record Number: [id number] Patient Account Number: [id number] Date of Birth/Sex: 02/09/1961 (54 y.o. Male) Treating RN: Huel CoventryWoody, Kim Primary Care Physician: Corky DownsMASOUD, [name] Other Clinician: Referring Physician: Corky DownsMASOUD, [name] Treating Physician/Extender: Rudene ReBritto, Aedon Deason Weeks in Treatment: 6 Information Obtained from: Patient Chief Complaint Patient presents to the wound care center for a consult due non healing wound to both lower extremities and accompanied by swelling and this has been worse for the last 5 months. He says the swelling of both lower extremity has been there for at least 6-7 years. Electronic Signature(s) Signed: 06/04/2016 4:00:39 PM By: Evlyn KannerBritto, Keyia Moretto MD, FACS Entered By: Evlyn KannerBritto, Shoichi Mielke on 06/04/2016 16:00:38 Blankley, [name] ([id number]) -------------------------------------------------------------------------------- HPI Details Patient Name: [name], [name] J. Date of Service: 06/04/2016 3:00 PM Medical Record Number: [id number] Patient Account Number: [id number] Date of Birth/Sex: 02/09/1961 [age] y.o. Male) Treating RN: Huel CoventryWoody, Kim Primary Care Physician: Corky DownsMASOUD, [name] Other Clinician: Referring Physician: Corky DownsMASOUD, [name] Treating Physician/Extender: Rudene ReBritto, Daleah Coulson Weeks in Treatment: 6 History of Present Illness Location: ulceration and weeping of both lower extremities left more than the right Quality: Patient reports experiencing heaviness to affected area(s). Severity: Patient states wound are getting worse. Duration: Patient has had the wound for > 5 months prior to seeking treatment at the wound center Timing: Pain in wound is Intermittent (comes and goes Context: The wound would happen gradually Modifying Factors: Other treatment(s) tried include:has been having weekly wraps applied to both  lower extremities at Dr. Zannie KehrShankar's office Associated Signs and Symptoms: Patient reports having increase swelling. HPI Description: 55 year old gentleman who has been treated in the past for venous ulcers of the lower extremity is also known to be a smoker for the last 20 years and smokes about a pack of cigarettes a day. He has been seen by Dr. Evette CristalSankar who has been treating left lower extremity venous ulcer with an Unna boot and have consulted vascular surgery to be seen by Dr. Gilda CreaseSchnier. the patient was also recently admitted to the hospital on 04/17/2016 and discharged on 04/19/2016 with bleeding, hyponatremia, cellulitis of the right leg, laceration of the right foot. 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 He was admitted to hospital with significant bleeding from the ulcer and initially started on IV antibiotics for underlying cellulitis which was suspected. Patient was seen by Dr. Evie LacksEsco of surgery and Dr Alberteen Spindleline of podiatry. Dr. Graciela HusbandsKlein had done excisional debridement of some of the superficial skin slough as well as the ulcerative area on the posterior aspect of the left ankle region. Unna's boots was applied. He was asked to follow-up at the wound center. He is on Xarelto for his chronic atrial fibrillation. 04/30/2016 -- the patient was seen by the PA at the vein and vascular practice who did a consultation but did not have any investigations ordered or done. The patient continues to have a lot of oozing from his wounds. 06/04/2016 -- he has seen his PCP who put him on Lasix 40 mg daily along with potassium. Electronic Signature(s) Signed: 06/04/2016 4:17:55 PM By: Evlyn KannerBritto, Tayvion Lauder MD, FACS Previous Signature: 06/04/2016 4:00:45 PM Version By: Evlyn KannerBritto, Lindy Garczynski MD, FACS Entered By: Evlyn KannerBritto, Stephanye Finnicum on 06/04/2016 16:17:55 [name], [name]  ([id number]) -------------------------------------------------------------------------------- Physical Exam Details Patient Name: [name], [name] J. Date of Service: 06/04/2016 3:00 PM Medical Record Number: [id number] Patient Account Number: [id number] Date  of Birth/Sex: [birth year](54 y.o. Male) Treating RN: Huel CoventryWoody, Kim Primary Care Physician: Corky DownsMASOUD, [name] Other Clinician: Referring Physician: Corky DownsMASOUD, [name] Treating Physician/Extender: Rudene ReBritto, Alyn Jurney Weeks in Treatment: 6 Constitutional . Pulse regular. Respirations normal and unlabored. Afebrile. . Eyes Nonicteric. Reactive to light. Ears, Nose, Mouth, and Throat Lips, teeth, and gums WNL.Marland Kitchen. Moist mucosa without lesions. Neck supple and nontender. No palpable supraclavicular or cervical adenopathy. Normal sized without goiter. Respiratory WNL. No retractions.. Cardiovascular Pedal Pulses WNL. No clubbing, cyanosis or edema. Lymphatic No adneopathy. No adenopathy. No adenopathy. Musculoskeletal Adexa without tenderness or enlargement.. Digits and nails w/o clubbing, cyanosis, infection, petechiae, ischemia, or inflammatory conditions.. Integumentary (Hair, Skin) No suspicious lesions. No crepitus or fluctuance. No peri-wound warmth or erythema. No masses.Marland Kitchen. Psychiatric Judgement and insight Intact.. No evidence of depression, anxiety, or agitation.. Notes there is much improvement in his lymphedema but some of it is still brawny and persistent. The ulceration on the right leg is completely healed and on the left leg anteriorly I will use a spot of Santyl ointment over the wound to help clear the subcutaneous debris which is fibrotic. The posterior ulceration on his ankle area is clean and we would use silver alginate. Electronic Signature(s) Signed: 06/04/2016 4:18:25 PM By: Evlyn KannerBritto, Naoko Diperna MD, FACS Previous Signature: 06/04/2016 4:03:00 PM Version By: Evlyn KannerBritto, Viana Sleep MD, FACS Entered By: Evlyn KannerBritto, Kennedie Pardoe on 06/04/2016 16:18:24 Flett,  [name] ([id number]) -------------------------------------------------------------------------------- Physician Orders Details Patient Name: [name], Ferguson J. Date of Service: 06/04/2016 3:00 PM Medical Record Number: [id number] Patient Account Number: [id number] Date of Birth/Sex: [birth year](54 y.o. 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 I87.032 Postthrombotic syndrome with ulcer and inflammation of left lower extremity F17.218 Nicotine dependence, cigarettes, with other nicotine-induced disorders Wound Cleansing Wound #1 Right,Circumferential Lower Leg o Cleanse wound with mild soap and water - when changing the wrap Wound #2 Left,Circumferential Lower Leg o Cleanse wound with mild soap and water - when changing the wrap Skin Barriers/Peri-Wound Care Wound #1 Right,Circumferential Lower Leg o Barrier cream Wound #2 Left,Circumferential Lower Leg o Barrier cream Primary Wound Dressing Wound #2 Left,Circumferential Lower Leg o Aquacel Ag o Santyl Ointment - ONLY FOR CLINIC***place on anterior lower leg*** one time order o Aquacel Ag Wound #1 Right,Circumferential Lower Leg o ABD Pad Secondary Dressing Wound #1 Right,Circumferential Lower Leg o ABD pad Wound #2 Left,Circumferential Lower Leg o ABD pad [name], [name] J. ([id number]) Dressing Change Frequency Wound #1 Right,Circumferential Lower Leg o Other: - Home Health to change wraps Tuesday Pt is seen in clinic on Friday and is changed then Wound #2 Left,Circumferential Lower Leg o Other: - Home  Health to change wraps Tuesday Pt is seen in clinic on Friday and is changed then Follow-up Appointments Wound #1 Right,Circumferential Lower Leg o Return Appointment in 1 week. Wound #2 Left,Circumferential Lower Leg o Return Appointment in 1 week. Edema Control Wound #1 Right,Circumferential Lower Leg o 4 Layer Compression System - Bilateral - Home Health to change only on Tuesdays unna to anchor 3cm from knee and 3 cm from toes o Elevate legs to the level of the heart and pump ankles as often as possible Wound #2 Left,Circumferential Lower Leg o 4 Layer Compression System -  Bilateral - Home Health to change only on Tuesdays unna to anchor 3cm from knee and 3 cm from toes o Elevate legs to the level of the heart and pump ankles as often as possible Additional Orders / Instructions Wound #1 Right,Circumferential Lower Leg o Stop Smoking o Increase protein intake. Wound #2 Left,Circumferential Lower Leg o Stop Smoking o Increase protein intake. Home Health Wound #1 Right,Circumferential Lower Leg o Continue Home Health Visits - [facility] ******Home Health to change wraps Tuesday Pt is seen in clinic on Friday and is changed then****** o Home Health Nurse may visit PRN to address patientos wound care needs. o FACE TO FACE ENCOUNTER: MEDICARE and MEDICAID PATIENTS: I certify that this patient is under my care and that I had a face-to-face encounter that meets the physician face-to-face encounter requirements with this patient on this date. The encounter with the patient was in [facility]. ([id number]) whole or in part for the following MEDICAL CONDITION: (primary reason for Home Healthcare) MEDICAL NECESSITY: I certify, that based on my findings, NURSING services are a medically necessary home health service. HOME BOUND STATUS: I certify that my clinical findings support that this patient is homebound (i.e., Due to illness or injury, pt requires aid  of supportive devices such as crutches, cane, wheelchairs, walkers, the use of special transportation or the assistance of another person to leave their place of residence. There is a normal inability to leave the home and doing so requires considerable and taxing effort. Other absences are for medical reasons / religious services and are infrequent or of short duration when for other reasons). o If current dressing causes regression in wound condition, may D/C ordered dressing product/s and apply Normal Saline Moist Dressing daily until next Wound Healing Center / Other MD appointment. Notify Wound Healing Center of regression in wound condition at [phone]. o Please direct any NON-WOUND related issues/requests for orders to patient's Primary Care Physician Wound #2 Left,Circumferential Lower Leg o Continue Home Health Visits - [facility] ******Home Health to change wraps Tuesday Pt is seen in clinic on Friday and is changed then****** o Home Health Nurse may visit PRN to address patientos wound care needs. o FACE TO FACE ENCOUNTER: MEDICARE and MEDICAID PATIENTS: I certify that this patient is under my care and that I had a face-to-face encounter that meets the physician face-to-face encounter requirements with this patient on this date. The encounter with the patient was in whole or in part for the following MEDICAL CONDITION: (primary reason for Home Healthcare) MEDICAL NECESSITY: I certify, that based on my findings, NURSING services are a medically necessary home health service. HOME BOUND STATUS: I certify that my clinical findings support that this patient is homebound (i.e., Due to illness or injury, pt requires aid of supportive devices such as crutches, cane, wheelchairs, walkers, the use of special transportation or the assistance of another person to leave their place of residence. There is a normal inability to leave the home and doing so requires considerable and  taxing effort. Other absences are for medical reasons / religious services and are infrequent or of short duration when for other reasons). o If current dressing causes regression in wound condition, may D/C ordered dressing product/s and apply Normal Saline Moist Dressing daily until next Wound Healing Center / Other MD appointment. Notify Wound Healing Center of regression in wound condition at [phone]. o Please direct any NON-WOUND related issues/requests for orders to patient's Primary Care Physician Notes Order compression stockings. Electronic Signature(s)  Signed: 06/04/2016 6:13:05 PM By: Elliot Gurney RN, BSN, Kim RN, BSN Signed: 06/07/2016 7:56:46 AM By: Evlyn Kanner MD, FACS Entered By: Elliot Gurney RN, BSN, Kim on 06/04/2016 16:40:01 [name], [name] ([id number]) -------------------------------------------------------------------------------- Problem List Details Patient Name: [name] Route Date of Service: 06/04/2016 3:00 PM Medical Record Number: [id number] Patient Account Number: [id number] Date of Birth/Sex: 1961/05/06 (55 y.o. Male) Treating RN: Huel Coventry Primary Care Physician: Corky Downs Other Clinician: Referring Physician: Corky Downs Treating Physician/Extender: Rudene Re in Treatment: 6 Active Problems ICD-10 Encounter Code Description Active Date Diagnosis I89.0 Lymphedema, not elsewhere classified 04/23/2016 Yes I87.031 Postthrombotic syndrome with ulcer and inflammation of 04/23/2016 Yes right lower extremity I87.032 Postthrombotic syndrome with ulcer and inflammation of 04/23/2016 Yes left lower extremity F17.218 Nicotine dependence, cigarettes, with other nicotine- 04/23/2016 Yes induced disorders Inactive Problems Resolved Problems Electronic Signature(s) Signed: 06/04/2016 4:00:32 PM By: Evlyn Kanner MD, FACS Entered By: Evlyn Kanner on 06/04/2016 16:00:32 [name], [name]  ([id number]) -------------------------------------------------------------------------------- Progress Note Details Patient Name: [name] Route Date of Service: 06/04/2016 3:00 PM Medical Record Number: [id number] Patient Account Number: [id number] Date of Birth/Sex: 11/02/1961 (55 y.o. Male) Treating RN: Huel Coventry Primary Care Physician: Corky Downs Other Clinician: Referring Physician: Corky Downs Treating Physician/Extender: Rudene Re in Treatment: 6 Subjective Chief Complaint Information obtained from Patient Patient presents to the wound care center for a consult due non healing wound to both lower extremities and accompanied by swelling and this has been worse for the last 5 months. He says the swelling of both lower extremity has been there for at least 6-7 years. History of Present Illness (HPI) The following HPI elements were documented for the patient's wound: Location: ulceration and weeping of both lower extremities left more than the right Quality: Patient reports experiencing heaviness to affected area(s). Severity: Patient states wound are getting worse. Duration: Patient has had the wound for > 5 months prior to seeking treatment at the wound center Timing: Pain in wound is Intermittent (comes and goes Context: The wound would happen gradually Modifying Factors: Other treatment(s) tried include:has been having weekly wraps applied to both lower extremities at Dr. Zannie Kehr office Associated Signs and Symptoms: Patient reports having increase swelling. 55 year old gentleman who has been treated in the past for venous ulcers of the lower extremity is also known to be a smoker for the last 20 years and smokes about a pack of cigarettes a day. He has been seen by Dr. Evette Cristal who has been treating left lower extremity venous ulcer with an Unna boot and have consulted vascular surgery to be seen by Dr. Gilda Crease. the patient was also recently admitted to the  hospital on 04/17/2016 and discharged on 04/19/2016 with bleeding, hyponatremia, cellulitis of the right leg, laceration of the right foot. 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 He was admitted to hospital with significant bleeding from the ulcer and initially started on IV antibiotics for underlying cellulitis which was suspected. Patient was seen by Dr. Evie Lacks of surgery and Dr Alberteen Spindle of podiatry. Dr. Graciela Husbands had done excisional debridement of some of the superficial skin slough as well as the ulcerative area on the posterior aspect of the left ankle region. Unna's boots was applied. He was asked to follow-up at the wound center. He is on Xarelto for his chronic atrial fibrillation. 04/30/2016 -- the patient was seen by the PA at the vein and vascular practice who did a consultation but did not  have any investigations ordered or done. The patient continues to have a lot of oozing from his wounds. 06/04/2016 -- he has seen his PCP who put him on Lasix 40 mg daily along with potassium. [name], [name] ([id number]) Objective Constitutional Pulse regular. Respirations normal and unlabored. Afebrile. Vitals Time Taken: 3:34 PM, Height: 76 in, Weight: 320 lbs, BMI: 38.9, Pulse: 66 bpm, Respiratory Rate: 20 breaths/min, Blood Pressure: 136/87 mmHg. Eyes Nonicteric. Reactive to light. Ears, Nose, Mouth, and Throat Lips, teeth, and gums WNL.Marland Kitchen Moist mucosa without lesions. Neck supple and nontender. No palpable supraclavicular or cervical adenopathy. Normal sized without goiter. Respiratory WNL. No retractions.. Cardiovascular Pedal Pulses WNL. No clubbing, cyanosis or edema. Lymphatic No adneopathy. No adenopathy. No adenopathy. Musculoskeletal Adexa without tenderness or enlargement.. Digits and nails w/o clubbing, cyanosis, infection, petechiae, ischemia, or inflammatory  conditions.Marland Kitchen Psychiatric Judgement and insight Intact.. No evidence of depression, anxiety, or agitation.. General Notes: there is much improvement in his lymphedema but some of it is still brawny and persistent. The ulceration on the right leg is completely healed and on the left leg anteriorly I will use a spot of Santyl ointment over the wound to help clear the subcutaneous debris which is fibrotic. The posterior ulceration on his ankle area is clean and we would use silver alginate. Integumentary (Hair, Skin) No suspicious lesions. No crepitus or fluctuance. No peri-wound warmth or erythema. No masses.. Wound #1 status is Open. Original cause of wound was Gradually Appeared. The wound is located on the [facility] ([id number]) Right,Circumferential Lower Leg. The wound measures 0.1cm length x 0.1cm width x 0.1cm depth; 0cm^2 area and 0cm^3 volume. The wound is limited to skin breakdown. There is a large amount of serous drainage noted. The wound margin is flat and intact. There is large (67-100%) red granulation within the wound bed. There is a small (1-33%) amount of necrotic tissue within the wound bed including Adherent Slough. The periwound skin appearance exhibited: Localized Edema, Maceration, Moist, Erythema. The surrounding wound skin color is noted with erythema which is circumferential. Periwound temperature was noted as No Abnormality. The periwound has tenderness on palpation. Wound #2 status is Open. Original cause of wound was Gradually Appeared. The wound is located on the Left,Circumferential Lower Leg. The wound measures 7cm length x 16cm width x 2cm depth; 87.965cm^2 area and 175.929cm^3 volume. Wound #3 status is Open. Original cause of wound was Gradually Appeared. The wound is located on the Left,Circumferential Foot. The wound measures 0cm length x 0cm width x 0cm depth; 0cm^2 area and 0cm^3 volume. Wound #4 status is Open. Original cause of wound was  Gradually Appeared. The wound is located on the Right,Circumferential Foot. The wound measures 0cm length x 0cm width x 0cm depth; 0cm^2 area and 0cm^3 volume. 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 I87.032 - Postthrombotic syndrome with ulcer and inflammation of left lower extremity F17.218 - Nicotine dependence, cigarettes, with other nicotine-induced disorders Plan Wound Cleansing: Wound #1 Right,Circumferential Lower Leg: Cleanse wound with mild soap and water - when changing the wrap Wound #2 Left,Circumferential Lower Leg: Cleanse wound with mild soap and water - when changing the wrap Skin Barriers/Peri-Wound Care: Wound #1 Right,Circumferential Lower Leg: Barrier cream Wound #2 Left,Circumferential Lower Leg: Barrier cream [name], [name] J. ([id number]) Primary Wound Dressing: Wound #2 Left,Circumferential Lower Leg: Aquacel Ag Santyl Ointment - ONLY FOR CLINIC***place on anterior lower leg*** one time  order Aquacel Ag Wound #1 Right,Circumferential Lower Leg: ABD Pad Secondary Dressing: Wound #1 Right,Circumferential Lower Leg: ABD pad Wound #2 Left,Circumferential Lower Leg: ABD pad Dressing Change Frequency: Wound #1 Right,Circumferential Lower Leg: Other: - Home Health to change wraps Tuesday Pt is seen in clinic on Friday and is changed then Wound #2 Left,Circumferential Lower Leg: Other: - Home Health to change wraps Tuesday Pt is seen in clinic on Friday and is changed then Follow-up Appointments: Wound #1 Right,Circumferential Lower Leg: Return Appointment in 1 week. Wound #2 Left,Circumferential Lower Leg: Return Appointment in 1 week. Edema Control: Wound #1 Right,Circumferential Lower Leg: 4 Layer Compression System - Bilateral - Home Health to change only on Tuesdays unna to anchor 3cm from knee and 3 cm from toes Elevate  legs to the level of the heart and pump ankles as often as possible Wound #2 Left,Circumferential Lower Leg: 4 Layer Compression System - Bilateral - Home Health to change only on Tuesdays unna to anchor 3cm from knee and 3 cm from toes Elevate legs to the level of the heart and pump ankles as often as possible Additional Orders / Instructions: Wound #1 Right,Circumferential Lower Leg: Stop Smoking Increase protein intake. Wound #2 Left,Circumferential Lower Leg: Stop Smoking Increase protein intake. Home Health: Wound #1 Right,Circumferential Lower Leg: Continue Home Health Visits - [facility] ******Home Health to change wraps Tuesday Pt is seen in clinic on Friday and is changed then****** Home Health Nurse may visit PRN to address patient s wound care needs. FACE TO FACE ENCOUNTER: MEDICARE and MEDICAID PATIENTS: I certify that this patient is under my care and that I had a face-to-face encounter that meets the physician face-to-face encounter requirements with this patient on this date. The encounter with the patient was in whole or in part for the following MEDICAL CONDITION: (primary reason for Home Healthcare) MEDICAL NECESSITY: I certify, that based on my findings, NURSING services are a medically necessary home health service. HOME BOUND STATUS: I certify that my clinical findings support that this patient is homebound (i.e., Due to illness or injury, pt requires aid of supportive devices such as crutches, cane, wheelchairs, walkers, the use of special transportation or the assistance of another person to leave their place of residence. There is a Dimperio, Blanca J. ([id number]) normal inability to leave the home and doing so requires considerable and taxing effort. Other absences are for medical reasons / religious services and are infrequent or of short duration when for other reasons). If current dressing causes regression in wound condition, may D/C ordered dressing product/s  and apply Normal Saline Moist Dressing daily until next Wound Healing Center / Other MD appointment. Notify Wound Healing Center of regression in wound condition at [phone]. Please direct any NON-WOUND related issues/requests for orders to patient's Primary Care Physician Wound #2 Left,Circumferential Lower Leg: Continue Home Health Visits - [facility] [place] ******Home Health to change wraps Tuesday Pt is seen in clinic on Friday and is changed then****** Home Health Nurse may visit PRN to address patient s wound care needs. FACE TO FACE ENCOUNTER: MEDICARE and MEDICAID PATIENTS: I certify that this patient is under my care and that I had a face-to-face encounter that meets the physician face-to-face encounter requirements with this patient on this date. The encounter with the patient was in whole or in part for the following MEDICAL CONDITION: (primary reason for Home Healthcare) MEDICAL NECESSITY: I certify, that based on my findings, NURSING services are a medically necessary home health service. HOME BOUND STATUS:  I certify that my clinical findings support that this patient is homebound (i.e., Due to illness or injury, pt requires aid of supportive devices such as crutches, cane, wheelchairs, walkers, the use of special transportation or the assistance of another person to leave their place of residence. There is a normal inability to leave the home and doing so requires considerable and taxing effort. Other absences are for medical reasons / religious services and are infrequent or of short duration when for other reasons). If current dressing causes regression in wound condition, may D/C ordered dressing product/s and apply Normal Saline Moist Dressing daily until next Wound Healing Center / Other MD appointment. Notify Wound Healing Center of regression in wound condition at [phone]. Please direct any NON-WOUND related issues/requests for orders to patient's Primary Care  Physician General Notes: Order compression stockings. I have discussed with him that he may need to take his diuretic regularly and watch his sodium intake as discussed by his PCP. Besides encouraging elevation and exercise I have recommended Aquacel Ag with a 4-layer Profore wrap, which will be changed once by the home health nurse and he will come regularly for wound care visits. His dressing is being changed to twice a week. we have also measured him up for 30-40 mm compression stockings and juxta lites in anticipation of his right leg healing soon. Electronic Signature(s) Signed: 06/07/2016 8:02:38 AM By: Evlyn Kanner MD, FACS Previous Signature: 06/07/2016 8:02:25 AM Version By: Evlyn Kanner MD, FACS Previous Signature: 06/04/2016 4:20:06 PM Version By: Evlyn Kanner MD, FACS Entered By: Evlyn Kanner on 06/07/2016 08:02:38 [name], GEOFF DACANAY ([id number]) Mcewan, [name] ([id number]) -------------------------------------------------------------------------------- SuperBill Details Patient Name: [name] Route Date of Service: 06/04/2016 Medical Record Number: [id number] Patient Account Number: [id number] Date of Birth/Sex: [birth year] (55 y.o. 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

## 2016-06-05 NOTE — Progress Notes (Addendum)
[name], Demtrius J. ([id number]) Visit Report for 06/04/2016 Arrival Information Details Patient Name: [name], [name] J. Date of Service: 06/04/2016 3:00 PM Medical Record Number: [id number] Patient Account Number: [id number] Date of Birth/Sex: 07-30-1961 (54 y.o. Male) Treating RN: [name] Primary Care Physician: [name], [name] Other Clinician: Referring Physician: Corky [name], [name] Treating Physician/Extender: [name] Weeks in Treatment: 6 Visit Information History Since Last Visit Added or deleted any medications: Yes Patient Arrived: Ambulatory Any new allergies or adverse reactions: No Arrival Time: 15:46 Had a fall or experienced change in No Accompanied By: self activities of daily living that may affect Transfer Assistance: None risk of falls: Patient Identification Verified: Yes Signs or symptoms of abuse/neglect since last No Secondary Verification Process Yes visito Completed: Hospitalized since last visit: No Patient Requires Transmission- No Has Dressing in Place as Prescribed: Yes Based Precautions: Has Compression in Place as Prescribed: Yes Patient Has Alerts: Yes Pain Present Now: No Patient Alerts: Patient on Blood Thinner [name] Electronic Signature(s) Signed: 06/04/2016 6:13:05 PM By: [name] GurneyWoody, RN, BSN, Kim RN, BSN Entered By: [name] on 06/04/2016 15:54:27 [name], [name] ([id number]) -------------------------------------------------------------------------------- Encounter Discharge Information Details Patient Name: [name], [name] J. Date of Service: 06/04/2016 3:00 PM Medical Record Number: [id number] Patient Account Number: [id number] Date of Birth/Sex: 07-30-1961 (54 y.o. Male) Treating RN: [name] Primary Care Physician: [name], [name] Other Clinician: Referring Physician: Corky [name], [name] Treating Physician/Extender: [name] Weeks in Treatment: 6 Encounter Discharge Information Items Discharge Pain Level: 0 Discharge  Condition: Stable Ambulatory Status: Ambulatory Discharge Destination: Home Transportation: Private Auto Accompanied By: self Schedule Follow-up Appointment: Yes Medication Reconciliation completed and provided to Patient/Care Yes [name]: Provided on Clinical Summary of Care: 06/04/2016 Form Type Recipient Paper Patient MD Electronic Signature(s) Signed: 06/18/2016 2:53:26 PM By: [name] Previous Signature: 06/04/2016 6:13:05 PM Version By: [name] GurneyWoody, RN, BSN, Kim RN, BSN Entered By: [name] on 06/18/2016 14:53:26 [name], [name] ([id number]) -------------------------------------------------------------------------------- Lower Extremity Assessment Details Patient Name: [name], [name] J. Date of Service: 06/04/2016 3:00 PM Medical Record Number: [id number] Patient Account Number: [id number] Date of Birth/Sex: 07-30-1961 [age] y.o. Male) Treating RN: [name] Primary Care Physician: [name], [name] Other Clinician: Referring Physician: Corky [name], [name] Treating Physician/Extender: [name] Weeks in Treatment: 6 Edema Assessment Assessed: [Left: No] [Right: No] E[Left: dema] [Right: :] Calf Left: Right: Point of Measurement: 38 cm From Medial Instep 48 cm 48.8 cm Ankle Left: Right: Point of Measurement: 13 cm From Medial Instep 28.4 cm 28 cm Vascular Assessment Pulses: Posterior Tibial Dorsalis Pedis Palpable: [Left:Yes] [Right:Yes] Extremity colors, hair growth, and conditions: Extremity Color: [Left:Hyperpigmented] [Right:Hyperpigmented] Hair Growth on Extremity: [Left:No] [Right:No] Temperature of Extremity: [Left:Warm] [Right:Warm] Capillary Refill: [Left:> 3 seconds] [Right:> 3 seconds] Toe Nail Assessment Left: Right: Thick: Yes Yes Discolored: Yes Yes Deformed: Yes Yes Improper Length and Hygiene: Yes Yes Electronic Signature(s) Signed: 06/04/2016 6:13:05 PM By: [name] GurneyWoody, RN, BSN, Kim RN, BSN Entered By: [name] on 06/04/2016  15:57:26 [name], [name] ([id number]) -------------------------------------------------------------------------------- Multi Wound Chart Details Patient Name: [name], [name] J. Date of Service: 06/04/2016 3:00 PM Medical Record Number: [id number] Patient Account Number: [id number] Date of Birth/Sex: 07-30-1961 [age] y.o. Male) Treating RN: [name] Primary Care Physician: [name], [name] Other Clinician: Referring Physician: Corky [name], [name] Treating Physician/Extender: [name] Weeks in Treatment: 6 Vital Signs Height(in): 76 Pulse(bpm): 66 Weight(lbs): 320 Blood Pressure 136/87 (mmHg): Body Mass Index(BMI): 39 Temperature(F): Respiratory Rate 20 (breaths/min): Photos: [1:No Photos] [2:No Photos] [3:No Photos] Wound Location: [1:Right, Circumferential Lower Leg] [2:Left,  Circumferential Lower Leg] [3:Left, Circumferential Foot] Wounding Event: [1:Gradually Appeared] [2:Gradually Appeared] [3:Gradually Appeared] Primary Etiology: [1:Venous Leg Ulcer] [2:Venous Leg Ulcer] [3:Vasculitis] Date Acquired: [1:12/14/2015] [2:12/14/2015] [3:12/14/2015] Weeks of Treatment: [1:6] [2:6] [3:6] Wound Status: [1:Open] [2:Open] [3:Open] Measurements L x W x D 0x0x0 [2:7x16x2] [3:0x0x0] (cm) Area (cm) : [1:0] [2:87.965] [3:0] Volume (cm) : [1:0] [2:175.929] [3:0] % Reduction in Area: [1:100.00%] [2:80.70%] [3:100.00%] % Reduction in Volume: 100.00% [2:-285.30%] [3:100.00%] Classification: [1:Partial Thickness] [2:Partial Thickness] [3:Partial Thickness] Periwound Skin Texture: No Abnormalities Noted [2:No Abnormalities Noted] [3:No Abnormalities Noted] Periwound Skin [1:No Abnormalities Noted] [2:No Abnormalities Noted] [3:No Abnormalities Noted] Moisture: Periwound Skin Color: No Abnormalities Noted [2:No Abnormalities Noted] [3:No Abnormalities Noted] Tenderness on [1:No] [2:No] [3:No] Wound Number: 4 N/A N/A Photos: No Photos N/A N/A Wound Location: Right, Circumferential N/A  N/A Foot Wounding Event: Gradually Appeared N/A N/A Primary Etiology: Vasculitis N/A N/A Date Acquired: 12/14/2015 N/A N/A Weeks of Treatment: 6 N/A N/A DAXSON, REFFETT ([id number]) Wound Status: Open N/A N/A Measurements L x W x D 0x0x0 N/A N/A (cm) Area (cm) : 0 N/A N/A Volume (cm) : 0 N/A N/A % Reduction in Area: 100.00% N/A N/A % Reduction in Volume: 100.00% N/A N/A Classification: Partial Thickness N/A N/A Periwound Skin Texture: No Abnormalities Noted N/A N/A Periwound Skin No Abnormalities Noted N/A N/A Moisture: Periwound Skin Color: No Abnormalities Noted N/A N/A Tenderness on No N/A N/A Palpation: Treatment Notes Electronic Signature(s) Signed: 06/04/2016 6:13:05 PM By: [name] Gurney, RN, BSN, Kim RN, BSN Entered By: [name] Gurney, RN, BSN, [name] on 06/04/2016 16:11:03 Faiola, [name] ([id number]) -------------------------------------------------------------------------------- Multi-Disciplinary Care Plan Details Patient Name: [name] Date of Service: 06/04/2016 3:00 PM Medical Record Number: [id number] Patient Account Number: [id number] Date of Birth/Sex: 04/16/1961 (54 y.o. Male) Treating RN: [name] Coventry Primary Care Physician: [name] Downs Other Clinician: Referring Physician: Corky Downs Treating Physician/Extender: [name] Re in Treatment: 6 Active Inactive Orientation to the Wound Care Program Nursing Diagnoses: Knowledge deficit related to the wound healing center program Goals: Patient/caregiver will verbalize understanding of the Wound Healing Center Program Date Initiated: 04/23/2016 Goal Status: Active Interventions: Provide education on orientation to the wound center Notes: Pain, Acute or Chronic Nursing Diagnoses: Pain, acute or chronic: actual or potential Potential alteration in comfort, pain Goals: Patient will verbalize adequate pain control and receive pain control interventions during procedures as needed Date Initiated:  04/23/2016 Goal Status: Active Patient/caregiver will verbalize adequate pain control between visits Date Initiated: 04/23/2016 Goal Status: Active Interventions: Assess comfort goal upon admission Complete pain assessment as per visit requirements Notes: Wound/Skin Impairment Bocchino, Less Shela Commons ([id number]) Nursing Diagnoses: Impaired tissue integrity Knowledge deficit related to smoking impact on wound healing Knowledge deficit related to ulceration/compromised skin integrity Goals: Ulcer/skin breakdown will have a volume reduction of 30% by week 4 Date Initiated: 04/23/2016 Goal Status: Active Ulcer/skin breakdown will have a volume reduction of 50% by week 8 Date Initiated: 04/23/2016 Goal Status: Active Ulcer/skin breakdown will have a volume reduction of 80% by week 12 Date Initiated: 04/23/2016 Goal Status: Active Interventions: Assess ulceration(s) every visit Notes: Electronic Signature(s) Signed: 06/04/2016 6:13:05 PM By: [name] Gurney, RN, BSN, Kim RN, BSN Entered By: [name] Gurney, RN, BSN, [name] on 06/04/2016 16:10:57 Kreps, [name] ([id number]) -------------------------------------------------------------------------------- Pain Assessment Details Patient Name: [name] Date of Service: 06/04/2016 3:00 PM Medical Record Number: [id number] Patient Account Number: [id number] Date of Birth/Sex: [birth year] (55 y.o. Male) Treating RN: [name] Coventry Primary Care Physician: [name] Downs Other Clinician: Referring Physician: Corky Downs Treating  Physician/Extender: [name] Weeks in Treatment: 6 Active Problems Location of Pain Severity and Description of Pain Patient Has Paino No Site Locations With Dressing Change: No Pain Management and Medication Current Pain Management: Electronic Signature(s) Signed: 06/04/2016 6:13:05 PM By: [name] GurneyWoody, RN, BSN, Kim RN, BSN Entered By: [name] on 06/04/2016 15:54:34 Maskell, [name]  ([id number]) -------------------------------------------------------------------------------- Patient/Caregiver Education Details Patient Name: [name], [name] J. Date of Service: 06/04/2016 3:00 PM Medical Record Number: [id number] Patient Account Number: [id number] Date of Birth/Gender: 1961-06-19 (54 y.o. Male) Treating RN: [name] Primary Care Physician: [name], [name] Other Clinician: Referring Physician: Corky [name], [name] Treating Physician/Extender: [name] Weeks in Treatment: 6 Education Assessment Education Provided To: Patient Education Topics Provided Wound/Skin Impairment: Handouts: Caring for Your Ulcer, Other: continuje wound care as prescribed Methods: Demonstration Responses: State content correctly Electronic Signature(s) Signed: 06/04/2016 6:13:05 PM By: [name] GurneyWoody, RN, BSN, Kim RN, BSN Entered By: [name] on 06/04/2016 16:41:52 Tiner, [name] ([id number]) -------------------------------------------------------------------------------- Wound Assessment Details Patient Name: [name], [name] J. Date of Service: 06/04/2016 3:00 PM Medical Record Number: [id number] Patient Account Number: [id number] Date of Birth/Sex: 1961-06-19 30(54 y.o. Male) Treating RN: [name] Primary Care Physician: [name], [name] Other Clinician: Referring Physician: Corky [name], [name] Treating Physician/Extender: [name] Weeks in Treatment: 6 Wound Status Wound Number: 1 Primary Etiology: Venous Leg Ulcer Wound Location: Right Lower Leg - Wound Status: Open Circumfernential Comorbid History: Arrhythmia, Hypertension Wounding Event: Gradually Appeared Date Acquired: 12/14/2015 Weeks Of Treatment: 6 Clustered Wound: No Photos Photo Uploaded By: [name] on 06/04/2016 18:01:39 Wound Measurements Length: (cm) 0.1 Width: (cm) 0.1 Depth: (cm) 0.1 Area: (cm) 0 Volume: (cm) 0 % Reduction in Area: 100% % Reduction in Volume: 100% Epithelialization:  None Wound Description Classification: Partial Thickness Wound Margin: Flat and Intact Exudate Amount: Large Exudate Type: Serous Exudate Color: amber Foul Odor After Cleansing: No Wound Bed Granulation Amount: Large (67-100%) Exposed Structure Granulation Quality: Red Fascia Exposed: No Necrotic Amount: Small (1-33%) Fat Layer Exposed: No Harnish, Manford J. ([id number]) Necrotic Quality: Adherent Slough Tendon Exposed: No Muscle Exposed: No Joint Exposed: No Bone Exposed: No Limited to Skin Breakdown Periwound Skin Texture Texture Color No Abnormalities Noted: No No Abnormalities Noted: No Localized Edema: Yes Erythema: Yes Erythema Location: Circumferential Moisture No Abnormalities Noted: No Temperature / Pain Maceration: Yes Temperature: No Abnormality Moist: Yes Tenderness on Palpation: Yes Wound Preparation Ulcer Cleansing: Other: soap and water, Electronic Signature(s) Signed: 06/04/2016 6:13:05 PM By: [name] GurneyWoody, RN, BSN, Kim RN, BSN Entered By: [name] on 06/04/2016 16:37:38 Girardin, [name] ([id number]) -------------------------------------------------------------------------------- Wound Assessment Details Patient Name: [name], [name] J. Date of Service: 06/04/2016 3:00 PM Medical Record Number: [id number] Patient Account Number: [id number] Date of Birth/Sex: 1961-06-19 [age] y.o. Male) Treating RN: [name] Primary Care Physician: [name], [name] Other Clinician: Referring Physician: Corky [name], [name] Treating Physician/Extender: [name] Weeks in Treatment: 6 Wound Status Wound Number: 2 Primary Etiology: Venous Leg Ulcer Wound Location: Left, Circumferential Lower Leg Wound Status: Open Wounding Event: Gradually Appeared Date Acquired: 12/14/2015 Weeks Of Treatment: 6 Clustered Wound: No Photos Photo Uploaded By: [name] on 06/04/2016 18:01:39 Wound Measurements Length: (cm) 7 Width: (cm) 16 Depth: (cm) 2 Area: (cm)  87.965 Volume: (cm) 175.929 % Reduction in Area: 80.7% % Reduction in Volume: -285.3% Wound Description Classification: Partial Thickness Periwound Skin Texture Texture Color No Abnormalities Noted: No No Abnormalities Noted: No Moisture No Abnormalities Noted: No Electronic Signature(s) Signed: 06/04/2016 6:13:05 PM By: [name] GurneyWoody,  RN, BSN, Selena Batten RN, BSN Much, [name] ([id number]) Entered By: [name] Gurney, RN, BSN, [name] on 06/04/2016 16:04:18 Lantigua, [name] ([id number]) -------------------------------------------------------------------------------- Wound Assessment Details Patient Name: [name] Date of Service: 06/04/2016 3:00 PM Medical Record Number: [id number] Patient Account Number: [id number] Date of Birth/Sex: 1961/11/11 (54 y.o. Male) Treating RN: [name] Coventry Primary Care Physician: [name] Downs Other Clinician: Referring Physician: Corky Downs Treating Physician/Extender: [name] Re in Treatment: 6 Wound Status Wound Number: 3 Primary Etiology: Vasculitis Wound Location: Left, Circumferential Foot Wound Status: Open Wounding Event: Gradually Appeared Date Acquired: 12/14/2015 Weeks Of Treatment: 6 Clustered Wound: No Photos Photo Uploaded By: [name] Gurney, RN, BSN, [name] on 06/04/2016 18:02:21 Wound Measurements Length: (cm) 0 % Reduction Width: (cm) 0 % Reduction Depth: (cm) 0 Area: (cm) 0 Volume: (cm) 0 in Area: 100% in Volume: 100% Wound Description Classification: Partial Thickness Periwound Skin Texture Texture Color No Abnormalities Noted: No No Abnormalities Noted: No Moisture No Abnormalities Noted: No Electronic Signature(s) Signed: 06/04/2016 6:13:05 PM By: [name] Gurney, RN, BSN, Kim RN, BSN Juba, [name] ([id number]) Entered By: [name] Gurney, RN, BSN, [name] on 06/04/2016 16:04:19 Czerniak, [name] ([id number]) -------------------------------------------------------------------------------- Wound Assessment Details Patient Name: [name] Date of Service: 06/04/2016 3:00 PM Medical Record Number: [id number] Patient Account Number: [id number] Date of Birth/Sex: 1961/02/25 (54 y.o. Male) Treating RN: [name] Coventry Primary Care Physician: [name] Downs Other Clinician: Referring Physician: Corky Downs Treating Physician/Extender: [name] Re in Treatment: 6 Wound Status Wound Number: 4 Primary Etiology: Vasculitis Wound Location: Right, Circumferential Foot Wound Status: Open Wounding Event: Gradually Appeared Date Acquired: 12/14/2015 Weeks Of Treatment: 6 Clustered Wound: No Photos Photo Uploaded By: [name] Gurney, RN, BSN, [name] on 06/04/2016 18:02:21 Wound Measurements Length: (cm) 0 % Reduction in Width: (cm) 0 % Reduction in Depth: (cm) 0 Area: (cm) 0 Volume: (cm) 0 Area: 100% Volume: 100% Wound Description Classification: Partial Thickness Periwound Skin Texture Texture Color No Abnormalities Noted: No No Abnormalities Noted: No Moisture No Abnormalities Noted: No Electronic Signature(s) Signed: 06/04/2016 6:13:05 PM By: [name] Gurney, RN, BSN, Kim RN, BSN Wuellner, [name] ([id number]) Entered By: [name] Gurney, RN, BSN, [name] on 06/04/2016 16:04:19 Ahlers, [name] ([id number]) -------------------------------------------------------------------------------- Vitals Details Patient Name: [name] Date of Service: 06/04/2016 3:00 PM Medical Record Number: [id number] Patient Account Number: [id number] Date of Birth/Sex: August 15, 1961 (55 y.o. Male) Treating RN: [name] Coventry Primary Care Physician: [name] Downs Other Clinician: Referring Physician: Corky Downs Treating Physician/Extender: [name] Re in Treatment: 6 Vital Signs Time Taken: 15:34 Pulse (bpm): 66 Height (in): 76 Respiratory Rate (breaths/min): 20 Weight (lbs): 320 Blood Pressure (mmHg): 136/87 Body Mass Index (BMI): 38.9 Reference Range: 80 - 120 mg / dl Electronic Signature(s) Signed: 06/04/2016 6:13:05 PM By: [name] Gurney,  RN, BSN, Kim RN, BSN Entered By: [name] Gurney, RN, BSN, [name] on 06/04/2016 15:54:57

## 2016-06-11 ENCOUNTER — Encounter: Payer: BC Managed Care – PPO | Admitting: Surgery

## 2016-06-11 DIAGNOSIS — I890 Lymphedema, not elsewhere classified: Secondary | ICD-10-CM | POA: Diagnosis not present

## 2016-06-12 NOTE — Progress Notes (Addendum)
[name], [name] J. ([id number]) Visit Report for 06/11/2016 Chief Complaint Document Details Patient Name: [name], [name] J. Date of Service: 06/11/2016 3:45 PM Medical Record Number: [id number] Patient Account Number: [id number] Date of Birth/Sex: 17-Jul-1961 [age] y.o. Male) Treating RN: Phillis HaggisPinkerton, Debi Primary Care Physician: Corky DownsMASOUD, [name] Other Clinician: Referring Physician: Corky DownsMASOUD, [name] Treating Physician/Extender: Rudene ReBritto, Kameran Lallier Weeks in Treatment: 7 Information Obtained from: Patient Chief Complaint Patient presents to the wound care center for a consult due non healing wound to both lower extremities and accompanied by swelling and this has been worse for the last 5 months. He says the swelling of both lower extremity has been there for at least 6-7 years. Electronic Signature(s) Signed: 06/11/2016 4:11:11 PM By: Evlyn KannerBritto, Gracy Ehly MD, FACS Entered By: Evlyn KannerBritto, Davonda Ausley on 06/11/2016 16:11:10 [name], [name] ([id number]) -------------------------------------------------------------------------------- HPI Details Patient Name: [name], [name] J. Date of Service: 06/11/2016 3:45 PM Medical Record Number: [id number] Patient Account Number: [id number] Date of Birth/Sex: 17-Jul-1961 [age] y.o. Male) Treating RN: Phillis HaggisPinkerton, Debi Primary Care Physician: Corky DownsMASOUD, [name] Other Clinician: Referring Physician: Corky DownsMASOUD, [name] Treating Physician/Extender: Rudene ReBritto, Gisele Pack Weeks in Treatment: 7 History of Present Illness Location: ulceration and weeping of both lower extremities left more than the right Quality: Patient reports experiencing heaviness to affected area(s). Severity: Patient states wound are getting worse. Duration: Patient has had the wound for > 5 months prior to seeking treatment at the wound center Timing: Pain in wound is Intermittent (comes and goes Context: The wound would happen gradually Modifying Factors: Other treatment(s) tried include:has been having weekly wraps applied to  both lower extremities at Dr. Zannie KehrShankar's office Associated Signs and Symptoms: Patient reports having increase swelling. HPI Description: 55 year old gentleman who has been treated in the past for venous ulcers of the lower extremity is also known to be a smoker for the last 20 years and smokes about a pack of cigarettes a day. He has been seen by Dr. Evette CristalSankar who has been treating left lower extremity venous ulcer with an Unna boot and have consulted vascular surgery to be seen by Dr. Gilda CreaseSchnier. the patient was also recently admitted to the hospital on 04/17/2016 and discharged on 04/19/2016 with bleeding, hyponatremia, cellulitis of the right leg, laceration of the right foot. 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 He was admitted to hospital with significant bleeding from the ulcer and initially started on IV antibiotics for underlying cellulitis which was suspected. Patient was seen by Dr. Evie LacksEsco of surgery and Dr Alberteen Spindleline of podiatry. Dr. Graciela HusbandsKlein had done excisional debridement of some of the superficial skin slough as well as the ulcerative area on the posterior aspect of the left ankle region. Unna's boots was applied. He was asked to follow-up at the wound center. He is on Xarelto for his chronic atrial fibrillation. 04/30/2016 -- the patient was seen by the PA at the [facility] and vascular practice who did a consultation but did not have any investigations ordered or done. The patient continues to have a lot of oozing from his wounds. 06/04/2016 -- he has seen his PCP who put him on Lasix 40 mg daily along with potassium. Electronic Signature(s) Signed: 06/11/2016 4:11:28 PM By: Evlyn KannerBritto, Torry Istre MD, FACS Entered By: Evlyn KannerBritto, Ebrahim Deremer on 06/11/2016 16:11:28 [name], [name] ([id number]) -------------------------------------------------------------------------------- Physical Exam Details Patient Name: [name],  [name] J. Date of Service: 06/11/2016 3:45 PM Medical Record Number: [id number] Patient Account Number: [id number] Date of Birth/Sex: 17-Jul-1961 [age] y.o. Male) Treating RN: Ashok CordiaPinkerton, Debi Primary  Care Physician: Corky DownsMASOUD, [name] Other Clinician: Referring Physician: Corky DownsMASOUD, [name] Treating Physician/Extender: Rudene ReBritto, Chirstina Haan Weeks in Treatment: 7 Constitutional . Pulse regular. Respirations normal and unlabored. Afebrile. . Eyes Nonicteric. Reactive to light. Ears, Nose, Mouth, and Throat Lips, teeth, and gums WNL.Marland Kitchen. Moist mucosa without lesions. Neck supple and nontender. No palpable supraclavicular or cervical adenopathy. Normal sized without goiter. Respiratory WNL. No retractions.. Cardiovascular Pedal Pulses WNL. No clubbing, cyanosis or edema. Genitourinary (GU) No hydrocele, spermatocele, tenderness of the cord, or testicular mass.Marland Kitchen. Penis without lesions.Renetta Chalk. Genitalia without lesions. No cystocele, or rectocele. Pelvic support intact, no discharge.Marland Kitchen. Urethra without masses, tenderness or scarring.Marland Kitchen. Lymphatic No adneopathy. No adenopathy. No adenopathy. Musculoskeletal Adexa without tenderness or enlargement.. Digits and nails w/o clubbing, cyanosis, infection, petechiae, ischemia, or inflammatory conditions.. Integumentary (Hair, Skin) No suspicious lesions. No crepitus or fluctuance. No peri-wound warmth or erythema. No masses.Marland Kitchen. Psychiatric Judgement and insight Intact.. No evidence of depression, anxiety, or agitation.. Notes right leg continues to look excellent and there is no evidence of any open ulceration. The left leg has a couple of open areas one on the anterior calf and one on the posterior ankle area. No debridement was required today. Electronic Signature(s) Signed: 06/11/2016 4:44:53 PM By: Evlyn KannerBritto, Shiann Kam MD, FACS Entered By: Evlyn KannerBritto, Rodarius Kichline on 06/11/2016 16:44:53 Harten, [name]  ([id number]) -------------------------------------------------------------------------------- Physician Orders Details Patient Name: [name], [name] J. Date of Service: 06/11/2016 3:45 PM Medical Record Number: [id number] Patient Account Number: [id number] Date of Birth/Sex: [birth year](54 y.o. 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 I87.032 Postthrombotic syndrome with ulcer and inflammation of left lower extremity F17.218 Nicotine dependence, cigarettes, with other nicotine-induced disorders Wound Cleansing Wound #2 Left,Circumferential Lower Leg o Cleanse wound with mild soap and water Anesthetic Wound #2 Left,Circumferential Lower Leg o Topical Lidocaine 4% cream applied to wound bed prior to debridement Skin Barriers/Peri-Wound Care Wound #2 Left,Circumferential Lower Leg o Barrier cream o Moisturizing lotion Primary Wound Dressing Wound #2 Left,Circumferential Lower Leg o Santyl Ointment - ***place on anterior lower leg*** o Aquacel Ag - posterior leg Secondary Dressing Wound #2 Left,Circumferential Lower Leg o ABD pad o Dry Gauze Dressing Change Frequency Wound #2 Left,Circumferential Lower Leg o Change dressing every week Skaff, Jakeim Shela CommonsJ. ([id number]) Follow-up Appointments Wound #2 Left,Circumferential Lower Leg o Return Appointment in 1 week. Edema Control Wound #2 Left,Circumferential Lower Leg o 4 Layer Compression System - Bilateral - unna to anchor 3cm from knee and 3 cm from toes o Elevate legs to the level of the heart and  pump ankles as often as possible Additional Orders / Instructions Wound #2 Left,Circumferential Lower Leg o Stop Smoking o Increase protein intake. Home Health Wound #2 Left,Circumferential Lower Leg o D/C Home Health Services - You may discontinue. Electronic Signature(s) Signed: 06/11/2016 5:29:01 PM By: Alejandro MullingPinkerton, Debra Signed: 06/14/2016 4:11:02 PM By: Evlyn KannerBritto, Anael Rosch MD, FACS Previous Signature: 06/11/2016 4:47:37 PM Version By: Evlyn KannerBritto, Joas Motton MD, FACS Entered By: Alejandro MullingPinkerton, Debra on 06/11/2016 17:19:13 Grygiel, [name] ([id number]) -------------------------------------------------------------------------------- Problem List Details Patient Name: [name], [name] J. Date of Service: 06/11/2016 3:45 PM Medical Record Number: [id number] Patient Account Number: [id number] Date of Birth/Sex: [birth year] [age] y.o. Male) Treating RN: Phillis HaggisPinkerton, Debi Primary Care Physician: Juel BurrowMASOUD, [name]  Other Clinician: Referring Physician: Corky Downs Treating Physician/Extender: Rudene Re in Treatment: 7 Active Problems ICD-10 Encounter Code Description Active Date Diagnosis I89.0 Lymphedema, not elsewhere classified 04/23/2016 Yes I87.031 Postthrombotic syndrome with ulcer and inflammation of 04/23/2016 Yes right lower extremity I87.032 Postthrombotic syndrome with ulcer and inflammation of 04/23/2016 Yes left lower extremity F17.218 Nicotine dependence, cigarettes, with other nicotine- 04/23/2016 Yes induced disorders Inactive Problems Resolved Problems Electronic Signature(s) Signed: 06/11/2016 4:11:04 PM By: Evlyn Kanner MD, FACS Entered By: Evlyn Kanner on 06/11/2016 16:11:04 Pinnock, [name] ([id number]) -------------------------------------------------------------------------------- Progress Note Details Patient Name: [name] Date of Service: 06/11/2016 3:45 PM Medical Record Number: [id number] Patient Account Number: [id number] Date of Birth/Sex: November 16, 1961  (55 y.o. Male) Treating RN: Phillis Haggis Primary Care Physician: Corky Downs Other Clinician: Referring Physician: Corky Downs Treating Physician/Extender: Rudene Re in Treatment: 7 Subjective Chief Complaint Information obtained from Patient Patient presents to the wound care center for a consult due non healing wound to both lower extremities and accompanied by swelling and this has been worse for the last 5 months. He says the swelling of both lower extremity has been there for at least 6-7 years. History of Present Illness (HPI) The following HPI elements were documented for the patient's wound: Location: ulceration and weeping of both lower extremities left more than the right Quality: Patient reports experiencing heaviness to affected area(s). Severity: Patient states wound are getting worse. Duration: Patient has had the wound for > 5 months prior to seeking treatment at the wound center Timing: Pain in wound is Intermittent (comes and goes Context: The wound would happen gradually Modifying Factors: Other treatment(s) tried include:has been having weekly wraps applied to both lower extremities at Dr. Zannie Kehr office Associated Signs and Symptoms: Patient reports having increase swelling. 55 year old gentleman who has been treated in the past for venous ulcers of the lower extremity is also known to be a smoker for the last 20 years and smokes about a pack of cigarettes a day. He has been seen by Dr. Evette Cristal who has been treating left lower extremity venous ulcer with an Unna boot and have consulted vascular surgery to be seen by Dr. Gilda Crease. the patient was also recently admitted to the hospital on 04/17/2016 and discharged on 04/19/2016 with bleeding, hyponatremia, cellulitis of the right leg, laceration of the right foot. 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 He was  admitted to hospital with significant bleeding from the ulcer and initially started on IV antibiotics for underlying cellulitis which was suspected. Patient was seen by Dr. Evie Lacks of surgery and Dr Alberteen Spindle of podiatry. Dr. Graciela Husbands had done excisional debridement of some of the superficial skin slough as well as the ulcerative area on the posterior aspect of the left ankle region. Unna's boots was applied. He was asked to follow-up at the wound center. He is on Xarelto for his chronic atrial fibrillation. 04/30/2016 -- the patient was seen by the PA at the vein and vascular practice who did a consultation but did not have any investigations ordered or done. The patient continues to have a lot of oozing from his wounds. 06/04/2016 -- he has seen his PCP who put him on Lasix 40 mg daily along with potassium. Gibbon, [name] ([id number]) Objective Constitutional Pulse regular. Respirations normal and unlabored. Afebrile. Vitals Time Taken: 4:09 PM, Height: 76 in, Weight: 320 lbs, BMI: 38.9, Temperature: 98.4 F, Pulse: 66 bpm, Respiratory Rate: 20 breaths/min,  Blood Pressure: 122/87 mmHg. Eyes Nonicteric. Reactive to light. Ears, Nose, Mouth, and Throat Lips, teeth, and gums WNL.Marland Kitchen Moist mucosa without lesions. Neck supple and nontender. No palpable supraclavicular or cervical adenopathy. Normal sized without goiter. Respiratory WNL. No retractions.. Cardiovascular Pedal Pulses WNL. No clubbing, cyanosis or edema. Genitourinary (GU) No hydrocele, spermatocele, tenderness of the cord, or testicular mass.Marland Kitchen Penis without lesions.Renetta Chalk without lesions. No cystocele, or rectocele. Pelvic support intact, no discharge.Marland Kitchen Urethra without masses, tenderness or scarring.Marland Kitchen Lymphatic No adneopathy. No adenopathy. No adenopathy. Musculoskeletal Adexa without tenderness or enlargement.. Digits and nails w/o clubbing, cyanosis, infection, petechiae, ischemia, or inflammatory  conditions.Marland Kitchen Psychiatric Judgement and insight Intact.. No evidence of depression, anxiety, or agitation.. General Notes: right leg continues to look excellent and there is no evidence of any open ulceration. The left leg has a couple of open areas one on the anterior calf and one on the posterior ankle area. No debridement was required today. Fedorchak, [name] ([id number]) Integumentary (Hair, Skin) No suspicious lesions. No crepitus or fluctuance. No peri-wound warmth or erythema. No masses.. Wound #1 status is Healed - Epithelialized. Original cause of wound was Gradually Appeared. The wound is located on the Right,Circumferential Lower Leg. The wound measures 0cm length x 0cm width x 0cm depth; 0cm^2 area and 0cm^3 volume. Wound #2 status is Open. Original cause of wound was Gradually Appeared. The wound is located on the Left,Circumferential Lower Leg. The wound measures 7cm length x 16cm width x 0.2cm depth; 87.965cm^2 area and 17.593cm^3 volume. There is no tunneling or undermining noted. There is a large amount of serous drainage noted. The wound margin is distinct with the outline attached to the wound base. There is small (1-33%) pink granulation within the wound bed. There is a large (67-100%) amount of necrotic tissue within the wound bed including Adherent Slough. The periwound skin appearance exhibited: Localized Edema. Periwound temperature was noted as No Abnormality. The periwound has tenderness on palpation. 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 I87.032 - Postthrombotic syndrome with ulcer and inflammation of left lower extremity F17.218 - Nicotine dependence, cigarettes, with other nicotine-induced disorders Plan Wound Cleansing: Wound #2 Left,Circumferential Lower Leg: Cleanse wound with mild soap and water Anesthetic: Wound #2  Left,Circumferential Lower Leg: Topical Lidocaine 4% cream applied to wound bed prior to debridement Skin Barriers/Peri-Wound Care: Wound #2 Left,Circumferential Lower Leg: Barrier cream Moisturizing lotion Primary Wound Dressing: Wound #2 Left,Circumferential Lower Leg: Santyl Ointment - ***place on anterior lower leg*** Aquacel Ag - posterior leg Secondary Dressing: Yahr, Denis J. ([id number]) Wound #2 Left,Circumferential Lower Leg: ABD pad Dry Gauze Dressing Change Frequency: Wound #2 Left,Circumferential Lower Leg: Change dressing every week Follow-up Appointments: Wound #2 Left,Circumferential Lower Leg: Return Appointment in 1 week. Edema Control: Wound #2 Left,Circumferential Lower Leg: 4 Layer Compression System - Bilateral - unna to anchor 3cm from knee and 3 cm from toes Elevate legs to the level of the heart and pump ankles as often as possible Additional Orders / Instructions: Wound #2 Left,Circumferential Lower Leg: Stop Smoking Increase protein intake. Home Health: Wound #2 Left,Circumferential Lower Leg: D/C Home Health Services - You may discontinue. I have discussed with him that he may need to take his diuretic regularly and watch his sodium intake as discussed by his PCP. Besides encouraging elevation and exercise I have recommended Aquacel Ag with a 4-layer Profore wrap, his left lower extremity. We will cancel the  home health nurse and he will come regularly for wound care visits once a week here. He has got his juxta light stocking and we will use this on his right leg, which is completely healed. Electronic Signature(s) Signed: 06/14/2016 3:58:17 PM By: Evlyn Kanner MD, FACS Previous Signature: 06/11/2016 4:46:49 PM Version By: Evlyn Kanner MD, FACS Entered By: Evlyn Kanner on 06/14/2016 15:58:17 Renderos, [name] ([id number]) -------------------------------------------------------------------------------- SuperBill Details Patient Name:  [name] Date of Service: 06/11/2016 Medical Record Number: [id number] Patient Account Number: [id number] Date of Birth/Sex: Sep 15, 1961 (55 y.o. 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 I87.032 Postthrombotic syndrome with ulcer and inflammation of left lower extremity F17.218 Nicotine dependence, cigarettes, with other nicotine-induced disorders Facility Procedures CPT4: Description Modifier Quantity Code [id number] (Facility Use Only) [phone] - APPLY MULTLAY COMPRS LWR LT 1 LEG Physician Procedures CPT4: Description Modifier Quantity Code [id number] - WC PHYS LEVEL 3 - EST PT 1 ICD-10 Description Diagnosis I89.0 Lymphedema, not elsewhere classified 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

## 2016-06-12 NOTE — Progress Notes (Signed)
[name] RouteDAMERON, Arlin J. ([id number]) Visit Report for 06/11/2016 Arrival Information Details Patient Name: [name] RouteDAMERON, Joren J. Date of Service: 06/11/2016 3:45 PM Medical Record Number: [id number] Patient Account Number: [id number] Date of Birth/Sex: Jan 07, 1961 (55 y.o. Male) Treating RN: Phillis HaggisPinkerton, Debi Primary Care Physician: Corky DownsMASOUD, [name] Other Clinician: Referring Physician: Corky DownsMASOUD, [name] Treating Physician/Extender: Rudene ReBritto, Errol Weeks in Treatment: 7 Visit Information History Since Last Visit All ordered tests and consults were completed: No Patient Arrived: Ambulatory Added or deleted any medications: No Arrival Time: 16:08 Any new allergies or adverse reactions: No Accompanied By: girlfriend Had a fall or experienced change in No Transfer Assistance: None activities of daily living that may affect Patient Identification Verified: Yes risk of falls: Secondary Verification Process Yes Signs or symptoms of abuse/neglect since last No Completed: visito Patient Requires Transmission- No Hospitalized since last visit: No Based Precautions: Pain Present Now: No Patient Has Alerts: Yes Patient Alerts: Patient on Blood Thinner [name] Electronic Signature(s) Signed: 06/11/2016 5:29:01 PM By: Alejandro MullingPinkerton, Debra Entered By: Alejandro MullingPinkerton, Debra on 06/11/2016 16:09:36 [name], [name] ([id number]) -------------------------------------------------------------------------------- Encounter Discharge Information Details Patient Name: [name], Donie J. Date of Service: 06/11/2016 3:45 PM Medical Record Number: [id number] Patient Account Number: [id number] Date of Birth/Sex: Jan 07, 1961 [age] y.o. Male) Treating RN: Phillis HaggisPinkerton, Debi Primary Care Physician: Corky DownsMASOUD, [name] Other Clinician: Referring Physician: Corky DownsMASOUD, [name] Treating Physician/Extender: Rudene ReBritto, Errol Weeks in Treatment: 7 Encounter Discharge Information Items Discharge Pain Level: 0 Discharge Condition: Stable Ambulatory  Status: Ambulatory Discharge Destination: Home Transportation: Private Auto Accompanied By: girlfriend Schedule Follow-up Appointment: Yes Medication Reconciliation completed and provided to Patient/Care Yes Zsazsa Bahena: Provided on Clinical Summary of Care: 06/11/2016 Form Type Recipient Paper Patient MD Electronic Signature(s) Signed: 06/11/2016 4:58:25 PM By: Gwenlyn PerkingMoore, Shelia Entered By: Gwenlyn PerkingMoore, Shelia on 06/11/2016 16:58:25 [name], [name] ([id number]) -------------------------------------------------------------------------------- Lower Extremity Assessment Details Patient Name: [name], Chrisopher J. Date of Service: 06/11/2016 3:45 PM Medical Record Number: [id number] Patient Account Number: [id number] Date of Birth/Sex: Jan 07, 1961 [age] y.o. Male) Treating RN: Phillis HaggisPinkerton, Debi Primary Care Physician: Corky DownsMASOUD, [name] Other Clinician: Referring Physician: Corky DownsMASOUD, [name] Treating Physician/Extender: Rudene ReBritto, Errol Weeks in Treatment: 7 Edema Assessment Assessed: [Left: No] [Right: No] E[Left: dema] [Right: :] Calf Left: Right: Point of Measurement: 38 cm From Medial Instep 46.5 cm 47.6 cm Ankle Left: Right: Point of Measurement: 13 cm From Medial Instep 28.3 cm 27.6 cm Vascular Assessment Pulses: Posterior Tibial Dorsalis Pedis Palpable: [Left:Yes] [Right:Yes] Extremity colors, hair growth, and conditions: Extremity Color: [Left:Hyperpigmented] [Right:Hyperpigmented] Temperature of Extremity: [Left:Warm] [Right:Warm] Capillary Refill: [Left:< 3 seconds] [Right:< 3 seconds] Electronic Signature(s) Signed: 06/11/2016 5:29:01 PM By: Alejandro MullingPinkerton, Debra Entered By: Alejandro MullingPinkerton, Debra on 06/11/2016 16:26:52 [name], [name] ([id number]) -------------------------------------------------------------------------------- Multi Wound Chart Details Patient Name: [name], Otoniel J. Date of Service: 06/11/2016 3:45 PM Medical Record Number: [id number] Patient Account Number: [id number] Date of  Birth/Sex: Jan 07, 1961 [age] y.o. Male) Treating RN: Phillis HaggisPinkerton, Debi Primary Care Physician: Corky DownsMASOUD, [name] Other Clinician: Referring Physician: Corky DownsMASOUD, [name] Treating Physician/Extender: Rudene ReBritto, Errol Weeks in Treatment: 7 Vital Signs Height(in): 76 Pulse(bpm): 66 Weight(lbs): 320 Blood Pressure 122/87 (mmHg): Body Mass Index(BMI): 39 Temperature(F): 98.4 Respiratory Rate 20 (breaths/min): Photos: [1:No Photos] [2:No Photos] [N/A:N/A] Wound Location: [1:Right, Circumferential Lower Leg] [2:Left Lower Leg - Circumfernential] [N/A:N/A] Wounding Event: [1:Gradually Appeared] [2:Gradually Appeared] [N/A:N/A] Primary Etiology: [1:Venous Leg Ulcer] [2:Venous Leg Ulcer] [N/A:N/A] Comorbid History: [1:N/A] [2:Arrhythmia, Hypertension] [N/A:N/A] Date Acquired: [1:12/14/2015] [2:12/14/2015] [N/A:N/A] Weeks of Treatment: [1:7] [2:7] [N/A:N/A] Wound Status: [1:Healed - Epithelialized] [2:Open] [N/A:N/A] Measurements L x W x D 0x0x0 [2:7x16x0.2] [N/A:N/A] (cm) Area (cm) : [  1:0] [2:87.965] [N/A:N/A] Volume (cm) : [1:0] [2:17.593] [N/A:N/A] % Reduction in Area: [1:100.00%] [2:80.70%] [N/A:N/A] % Reduction in Volume: 100.00% [2:61.50%] [N/A:N/A] Classification: [1:Partial Thickness] [2:Partial Thickness] [N/A:N/A] Exudate Amount: [1:N/A] [2:Large] [N/A:N/A] Exudate Type: [1:N/A] [2:Serous] [N/A:N/A] Exudate Color: [1:N/A] [2:amber] [N/A:N/A] Wound Margin: [1:N/A] [2:Distinct, outline attached] [N/A:N/A] Granulation Amount: [1:N/A] [2:Small (1-33%)] [N/A:N/A] Granulation Quality: [1:N/A] [2:Pink] [N/A:N/A] Necrotic Amount: [1:N/A] [2:Large (67-100%)] [N/A:N/A] Epithelialization: [1:N/A] [2:Large (67-100%)] [N/A:N/A] Periwound Skin Texture: No Abnormalities Noted [2:Edema: Yes] [N/A:N/A] Periwound Skin [1:No Abnormalities Noted] [2:No Abnormalities Noted] [N/A:N/A] Moisture: Periwound Skin Color: No Abnormalities Noted [2:No Abnormalities Noted] [N/A:N/A] Temperature: [1:N/A] [2:No  Abnormality] [N/A:N/A] Tenderness on No Yes N/A Palpation: Wound Preparation: N/A Ulcer Cleansing: Other: N/A soap and water Topical Anesthetic Applied: Other: lidocaine 4% Treatment Notes Electronic Signature(s) Signed: 06/11/2016 5:29:01 PM By: Alejandro MullingPinkerton, Debra Entered By: Alejandro MullingPinkerton, Debra on 06/11/2016 16:32:01 Berhe, [name] ([id number]) -------------------------------------------------------------------------------- Multi-Disciplinary Care Plan Details Patient Name: [name], Mayes J. Date of Service: 06/11/2016 3:45 PM Medical Record Number: [id number] Patient Account Number: [id number] Date of Birth/Sex: 01-13-1961 [age] y.o. Male) Treating RN: Phillis HaggisPinkerton, Debi Primary Care Physician: Corky DownsMASOUD, [name] Other Clinician: Referring Physician: Corky DownsMASOUD, [name] Treating Physician/Extender: Rudene ReBritto, Errol Weeks in Treatment: 7 Active Inactive Orientation to the Wound Care Program Nursing Diagnoses: Knowledge deficit related to the wound healing center program Goals: Patient/caregiver will verbalize understanding of the Wound Healing Center Program Date Initiated: 04/23/2016 Goal Status: Active Interventions: Provide education on orientation to the wound center Notes: Pain, Acute or Chronic Nursing Diagnoses: Pain, acute or chronic: actual or potential Potential alteration in comfort, pain Goals: Patient will verbalize adequate pain control and receive pain control interventions during procedures as needed Date Initiated: 04/23/2016 Goal Status: Active Patient/caregiver will verbalize adequate pain control between visits Date Initiated: 04/23/2016 Goal Status: Active Interventions: Assess comfort goal upon admission Complete pain assessment as per visit requirements Notes: Wound/Skin Impairment Hoeppner, Niall Shela CommonsJ. ([id number]) Nursing Diagnoses: Impaired tissue integrity Knowledge deficit related to smoking impact on wound healing Knowledge deficit related to  ulceration/compromised skin integrity Goals: Ulcer/skin breakdown will have a volume reduction of 30% by week 4 Date Initiated: 04/23/2016 Goal Status: Active Ulcer/skin breakdown will have a volume reduction of 50% by week 8 Date Initiated: 04/23/2016 Goal Status: Active Ulcer/skin breakdown will have a volume reduction of 80% by week 12 Date Initiated: 04/23/2016 Goal Status: Active Interventions: Assess ulceration(s) every visit Notes: Electronic Signature(s) Signed: 06/11/2016 5:29:01 PM By: Alejandro MullingPinkerton, Debra Entered By: Alejandro MullingPinkerton, Debra on 06/11/2016 16:31:55 Piscopo, [name] ([id number]) -------------------------------------------------------------------------------- Pain Assessment Details Patient Name: [name], Safwan J. Date of Service: 06/11/2016 3:45 PM Medical Record Number: [id number] Patient Account Number: [id number] Date of Birth/Sex: 01-13-1961 [age] y.o. Male) Treating RN: Phillis HaggisPinkerton, Debi Primary Care Physician: Corky DownsMASOUD, [name] Other Clinician: Referring Physician: Corky DownsMASOUD, [name] Treating Physician/Extender: Rudene ReBritto, Errol Weeks in Treatment: 7 Active Problems Location of Pain Severity and Description of Pain Patient Has Paino No Site Locations Pain Management and Medication Current Pain Management: Electronic Signature(s) Signed: 06/11/2016 5:29:01 PM By: Alejandro MullingPinkerton, Debra Entered By: Alejandro MullingPinkerton, Debra on 06/11/2016 16:09:41 Rodriquez, [name] ([id number]) -------------------------------------------------------------------------------- Patient/Caregiver Education Details Patient Name: [name], Brockton J. Date of Service: 06/11/2016 3:45 PM Medical Record Number: [id number] Patient Account Number: [id number] Date of Birth/Gender: 01-13-1961 [age] y.o. Male) Treating RN: Phillis HaggisPinkerton, Debi Primary Care Physician: Corky DownsMASOUD, [name] Other Clinician: Referring Physician: Corky DownsMASOUD, [name] Treating Physician/Extender: Rudene ReBritto, Errol Weeks in Treatment: 7 Education  Assessment Education Provided To: Patient Education Topics Provided Wound/Skin Impairment: Handouts: Other: do not get wrap wet Methods: Demonstration, Explain/Verbal  Responses: State content correctly Electronic Signature(s) Signed: 06/11/2016 5:29:01 PM By: Alejandro Mulling Entered By: Alejandro Mulling on 06/11/2016 16:39:52 Cillo, [name] ([id number]) -------------------------------------------------------------------------------- Wound Assessment Details Patient Name: [name] Route Date of Service: 06/11/2016 3:45 PM Medical Record Number: [id number] Patient Account Number: [id number] Date of Birth/Sex: 05/05/1961 (55 y.o. Male) Treating RN: Ashok Cordia, Debi Primary Care Physician: Corky Downs Other Clinician: Referring Physician: Corky Downs Treating Physician/Extender: Rudene Re in Treatment: 7 Wound Status Wound Number: 1 Primary Etiology: Venous Leg Ulcer Wound Location: Right, Circumferential Lower Wound Status: Healed - Epithelialized Leg Wounding Event: Gradually Appeared Date Acquired: 12/14/2015 Weeks Of Treatment: 7 Clustered Wound: No Photos Photo Uploaded By: Alejandro Mulling on 06/11/2016 17:40:41 Wound Measurements Length: (cm) 0 % Reduction i Width: (cm) 0 % Reduction i Depth: (cm) 0 Area: (cm) 0 Volume: (cm) 0 n Area: 100% n Volume: 100% Wound Description Classification: Partial Thickness Periwound Skin Texture Texture Color No Abnormalities Noted: No No Abnormalities Noted: No Moisture No Abnormalities Noted: No Electronic Signature(s) Signed: 06/11/2016 5:29:01 PM By: Rodman Pickle, [name] ([id number]) Entered By: Alejandro Mulling on 06/11/2016 16:28:10 Flori, [name] ([id number]) -------------------------------------------------------------------------------- Wound Assessment Details Patient Name: [name] Route Date of Service: 06/11/2016 3:45 PM Medical Record Number: [id number] Patient  Account Number: [id number] Date of Birth/Sex: Jul 01, 1961 (55 y.o. Male) Treating RN: Phillis Haggis Primary Care Physician: Corky Downs Other Clinician: Referring Physician: Corky Downs Treating Physician/Extender: Rudene Re in Treatment: 7 Wound Status Wound Number: 2 Primary Etiology: Venous Leg Ulcer Wound Location: Left Lower Leg - Wound Status: Open Circumfernential Comorbid History: Arrhythmia, Hypertension Wounding Event: Gradually Appeared Date Acquired: 12/14/2015 Weeks Of Treatment: 7 Clustered Wound: No Photos Photo Uploaded By: Alejandro Mulling on 06/11/2016 17:41:16 Wound Measurements Length: (cm) 7 Width: (cm) 16 Depth: (cm) 0.2 Area: (cm) 87.965 Volume: (cm) 17.593 % Reduction in Area: 80.7% % Reduction in Volume: 61.5% Epithelialization: Large (67-100%) Tunneling: No Undermining: No Wound Description Classification: Partial Thickness Wound Margin: Distinct, outline attached Exudate Amount: Large Exudate Type: Serous Exudate Color: amber Foul Odor After Cleansing: No Wound Bed Granulation Amount: Small (1-33%) Granulation Quality: Pink Necrotic Amount: Large (67-100%) Erby, Xavian J. ([id number]) Necrotic Quality: Adherent Slough Periwound Skin Texture Texture Color No Abnormalities Noted: No No Abnormalities Noted: No Localized Edema: Yes Temperature / Pain Moisture Temperature: No Abnormality No Abnormalities Noted: No Tenderness on Palpation: Yes Wound Preparation Ulcer Cleansing: Other: soap and water, Topical Anesthetic Applied: Other: lidocaine 4%, Treatment Notes Wound #2 (Left, Circumferential Lower Leg) 1. Cleansed with: Cleanse wound with antibacterial soap and water 2. Anesthetic Topical Lidocaine 4% cream to wound bed prior to debridement 3. Peri-wound Care: Barrier cream 4. Dressing Applied: Aquacel Ag Santyl Ointment 5. Secondary Dressing Applied ABD Pad Dry Gauze 7. Secured with Tape 4-Layer  Compression System - Left Lower Extremity Notes unna to anchor; santly on anterior wound; aquacel ag posterior wound Electronic Signature(s) Signed: 06/11/2016 5:29:01 PM By: Alejandro Mulling Entered By: Alejandro Mulling on 06/11/2016 16:30:22 Tretter, [name] ([id number]) -------------------------------------------------------------------------------- Vitals Details Patient Name: [name] Route Date of Service: 06/11/2016 3:45 PM Medical Record Number: [id number] Patient Account Number: [id number] Date of Birth/Sex: 1961-02-09 (55 y.o. Male) Treating RN: Ashok Cordia, Debi Primary Care Physician: Corky Downs Other Clinician: Referring Physician: Corky Downs Treating Physician/Extender: Rudene Re in Treatment: 7 Vital Signs Time Taken: 16:09 Temperature (F): 98.4 Height (in): 76 Pulse (bpm): 66 Weight (lbs): 320 Respiratory Rate (breaths/min): 20 Body Mass Index (BMI): 38.9 Blood Pressure (mmHg): 122/87 Reference Range:  80 - 120 mg / dl Electronic Signature(s) Signed: 06/11/2016 5:29:01 PM By: Alejandro Mulling Entered By: Alejandro Mulling on 06/11/2016 16:11:35

## 2016-06-18 ENCOUNTER — Encounter: Payer: BC Managed Care – PPO | Attending: Surgery | Admitting: Surgery

## 2016-06-18 DIAGNOSIS — I890 Lymphedema, not elsewhere classified: Secondary | ICD-10-CM | POA: Insufficient documentation

## 2016-06-18 DIAGNOSIS — L97811 Non-pressure chronic ulcer of other part of right lower leg limited to breakdown of skin: Secondary | ICD-10-CM | POA: Diagnosis not present

## 2016-06-18 DIAGNOSIS — F17218 Nicotine dependence, cigarettes, with other nicotine-induced disorders: Secondary | ICD-10-CM | POA: Diagnosis not present

## 2016-06-18 DIAGNOSIS — I10 Essential (primary) hypertension: Secondary | ICD-10-CM | POA: Insufficient documentation

## 2016-06-18 DIAGNOSIS — K219 Gastro-esophageal reflux disease without esophagitis: Secondary | ICD-10-CM | POA: Insufficient documentation

## 2016-06-18 DIAGNOSIS — I482 Chronic atrial fibrillation: Secondary | ICD-10-CM | POA: Diagnosis not present

## 2016-06-18 DIAGNOSIS — I87033 Postthrombotic syndrome with ulcer and inflammation of bilateral lower extremity: Secondary | ICD-10-CM | POA: Insufficient documentation

## 2016-06-18 DIAGNOSIS — Z6838 Body mass index (BMI) 38.0-38.9, adult: Secondary | ICD-10-CM | POA: Insufficient documentation

## 2016-06-18 DIAGNOSIS — Z7901 Long term (current) use of anticoagulants: Secondary | ICD-10-CM | POA: Diagnosis not present

## 2016-06-18 DIAGNOSIS — L97821 Non-pressure chronic ulcer of other part of left lower leg limited to breakdown of skin: Secondary | ICD-10-CM | POA: Insufficient documentation

## 2016-06-19 NOTE — Progress Notes (Signed)
[name] ([id number]) Visit Report for 06/18/2016 Chief Complaint Document Details Patient Name: [name], [name] Date of Service: 06/18/2016 3:00 PM Medical Record Number: [id number] Patient Account Number: [id number] Date of Birth/Sex: 01/05/1961 (55 y.o. Male) Treating RN: Phillis Haggis Primary Care Physician: Corky Downs Other Clinician: Referring Physician: Corky Downs Treating Physician/Extender: Rudene Re in Treatment: 8 Information Obtained from: Patient Chief Complaint Patient presents to the wound care center for a consult due non healing wound to both lower extremities and accompanied by swelling and this has been worse for the last 5 months. He says the swelling of both lower extremity has been there for at least 6-7 years. Electronic Signature(s) Signed: 06/18/2016 3:28:58 PM By: Evlyn Kanner MD, FACS Entered By: Evlyn Kanner on 06/18/2016 15:28:58 [name] ([id number]) -------------------------------------------------------------------------------- HPI Details Patient Name: [name] Date of Service: 06/18/2016 3:00 PM Medical Record Number: [id number] Patient Account Number: [id number] Date of Birth/Sex: 05/10/1961 (55 y.o. Male) Treating RN: Phillis Haggis Primary Care Physician: Corky Downs Other Clinician: Referring Physician: Corky Downs Treating Physician/Extender: Rudene Re in Treatment: 8 History of Present Illness Location: ulceration and weeping of both lower extremities left more than the right Quality: Patient reports experiencing heaviness to affected area(s). Severity: Patient states wound are getting worse. Duration: Patient has had the wound for > 5 months prior to seeking treatment at the wound center Timing: Pain in wound is Intermittent (comes and goes Context: The wound would happen gradually Modifying Factors: Other treatment(s) tried include:has been having weekly wraps applied to both  lower extremities at Dr. Zannie Kehr office Associated Signs and Symptoms: Patient reports having increase swelling. HPI Description: 55 year old gentleman who has been treated in the past for venous ulcers of the lower extremity is also known to be a smoker for the last 20 years and smokes about a pack of cigarettes a day. He has been seen by Dr. Evette Cristal who has been treating left lower extremity venous ulcer with an Unna boot and have consulted vascular surgery to be seen by Dr. Gilda Crease. the patient was also recently admitted to the hospital on 04/17/2016 and discharged on 04/19/2016 with bleeding, hyponatremia, cellulitis of the right leg, laceration of the right foot. 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 He was admitted to hospital with significant bleeding from the ulcer and initially started on IV antibiotics for underlying cellulitis which was suspected. Patient was seen by Dr. Evie Lacks of surgery and Dr Alberteen Spindle of podiatry. Dr. Graciela Husbands had done excisional debridement of some of the superficial skin slough as well as the ulcerative area on the posterior aspect of the left ankle region. Unna's boots was applied. He was asked to follow-up at the wound center. He is on Xarelto for his chronic atrial fibrillation. 04/30/2016 -- the patient was seen by the PA at the [facility] vascular [facility] who did a consultation but did not have any investigations ordered or done. The patient continues to have a lot of oozing from his wounds. 06/04/2016 -- he has seen his PCP who put him on Lasix 40 mg daily along with potassium. Electronic Signature(s) Signed: 06/18/2016 3:29:03 PM By: Evlyn Kanner MD, FACS Entered By: Evlyn Kanner on 06/18/2016 15:29:03 [name] ([id number]) -------------------------------------------------------------------------------- Physical Exam Details Patient Name: [name] Date of Service: 06/18/2016 3:00 PM Medical Record Number: [id number] Patient Account Number: [id number] Date of Birth/Sex: [birth year] (55 y.o. Male) Treating RN: Ashok Cordia, Debi Primary  Care Physician: Corky Downs Other Clinician: Referring Physician: Corky Downs Treating Physician/Extender: Rudene Re in Treatment: 8 Constitutional . Pulse regular. Respirations normal and unlabored. Afebrile. . Eyes Nonicteric. Reactive to light. Ears, Nose, Mouth, and Throat Lips, teeth, and gums WNL.Marland Kitchen Moist mucosa without lesions. Neck supple and nontender. No palpable supraclavicular or cervical adenopathy. Normal sized without goiter. Respiratory WNL. No retractions.. Cardiovascular Pedal Pulses WNL. No clubbing, cyanosis or edema. Lymphatic No adneopathy. No adenopathy. No adenopathy. Musculoskeletal Adexa without tenderness or enlargement.. Digits and nails w/o clubbing, cyanosis, infection, petechiae, ischemia, or inflammatory conditions.. Integumentary (Hair, Skin) No suspicious lesions. No crepitus or fluctuance. No peri-wound warmth or erythema. No masses.Marland Kitchen Psychiatric Judgement and insight Intact.. No evidence of depression, anxiety, or agitation.. Notes the left leg has 3 areas one of them on the posterior aspect is completely healed while the anterior and lateral 1 have minimal debris. No sharp debridement was required and the wounds were washed out with moist saline Electronic Signature(s) Signed: 06/18/2016 3:29:40 PM By: Evlyn Kanner MD, FACS Entered By: Evlyn Kanner on 06/18/2016 15:29:39 [name] ([id number]) -------------------------------------------------------------------------------- Physician Orders Details Patient Name: [name] Date of Service: 06/18/2016 3:00 PM Medical Record Number: [id number] Patient Account Number: [id number] Date of Birth/Sex: 05-29-1961 (55 y.o. Male) Treating RN: Phillis Haggis Primary Care Physician:  Corky Downs Other Clinician: Referring Physician: Corky Downs Treating Physician/Extender: Rudene Re in Treatment: 8 Verbal / Phone Orders: Yes ClinicianAshok Cordia, Debi Read Back and Verified: Yes Diagnosis Coding Wound Cleansing Wound #2 Left,Circumferential Lower Leg o Cleanse wound with mild soap and water Anesthetic Wound #2 Left,Circumferential Lower Leg o Topical Lidocaine 4% cream applied to wound bed prior to debridement Skin Barriers/Peri-Wound Care Wound #2 Left,Circumferential Lower Leg o Barrier cream o Moisturizing lotion Primary Wound Dressing Wound #2 Left,Circumferential Lower Leg o Aquacel Ag Secondary Dressing Wound #2 Left,Circumferential Lower Leg o ABD pad o Dry Gauze Dressing Change Frequency Wound #2 Left,Circumferential Lower Leg o Change dressing every week Follow-up Appointments Wound #2 Left,Circumferential Lower Leg o Return Appointment in 1 week. Edema Control Wound #2 Left,Circumferential Lower Leg o 4 Layer Compression System - Bilateral - unna to anchor 3cm from knee and 3 cm from toes o Elevate legs to the level of the heart and pump ankles as often as possible Cullin, Azaryah J. ([id number]) Additional Orders / Instructions Wound #2 Left,Circumferential Lower Leg o Stop Smoking o Increase protein intake. Electronic Signature(s) Signed: 06/18/2016 3:36:55 PM By: Evlyn Kanner MD, FACS Signed: 06/18/2016 4:27:53 PM By: Alejandro Mulling Entered By: Alejandro Mulling on 06/18/2016 15:17:47 Goosby, Nolon [name] ([id number]) -------------------------------------------------------------------------------- Problem List Details Patient Name: [name] Date of Service: 06/18/2016 3:00 PM Medical Record Number: [id number] Patient Account Number: [id number] Date of Birth/Sex: [birth year] (55 y.o. 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 Male) Treating RN: Ashok Cordia, Debi Primary Care Physician: Corky Downs Other Clinician: Referring Physician: Corky Downs Treating Physician/Extender: Rudene Re in Treatment: 8 Subjective Chief Complaint Information obtained from Patient Patient presents to the wound care center for a consult due non healing wound to both lower extremities and accompanied by swelling and this has been worse for the last 5 months. He says the swelling of both lower extremity has been there for at least 6-7 years. History of Present Illness (HPI) The following HPI elements were documented for the patient's wound: Location: ulceration and weeping of both lower extremities left more than the right Quality: Patient reports experiencing heaviness to affected area(s). Severity: Patient states wound are getting worse. Duration: Patient has had the wound for > 5 months prior to seeking treatment at the wound  center Timing: Pain in wound is Intermittent (comes and goes Context: The wound would happen gradually Modifying Factors: Other treatment(s) tried include:has been having weekly wraps applied to both lower extremities at Dr. Zannie Kehr office Associated Signs and Symptoms: Patient reports having increase swelling. 55 year old gentleman who has been treated in the past for venous ulcers of the lower extremity is also known to be a smoker for the last 20 years and smokes about a pack of cigarettes a day. He has been seen by Dr. Evette Cristal who has been treating left lower extremity venous ulcer with an Unna boot and have consulted vascular surgery to be seen by Dr. Gilda Crease. the patient was also recently admitted to the hospital on 04/17/2016 and discharged on 04/19/2016 with bleeding, hyponatremia, cellulitis of the right leg, laceration of the right foot. 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 He was admitted to hospital with significant bleeding from the ulcer and initially started on IV antibiotics for underlying cellulitis which was suspected. Patient was seen by Dr. Evie Lacks of surgery and Dr Alberteen Spindle of podiatry. Dr. Graciela Husbands had done excisional debridement of some of the superficial skin slough as well as the ulcerative area on the posterior aspect of the left ankle region. Unna's boots was applied. He was asked to follow-up at the wound center. He is on Xarelto for his chronic atrial fibrillation. 04/30/2016 -- the patient was seen by the PA at the vein and vascular practice who did a consultation but did not have any investigations ordered or done. The patient continues to have a lot of oozing from his wounds. 06/04/2016 -- he has seen his PCP who put him on Lasix 40 mg daily along with potassium. Mcghee, Nolon [name] ([id number]) Objective Constitutional Pulse regular. Respirations normal and unlabored.  Afebrile. Vitals Time Taken: 2:55 PM, Height: 76 in, Weight: 320 lbs, BMI: 38.9, Temperature: 97.7 F, Pulse: 85 bpm, Respiratory Rate: 20 breaths/min, Blood Pressure: 136/101 mmHg. Eyes Nonicteric. Reactive to light. Ears, Nose, Mouth, and Throat Lips, teeth, and gums WNL.Marland Kitchen Moist mucosa without lesions. Neck supple and nontender. No palpable supraclavicular or cervical adenopathy. Normal sized without goiter. Respiratory WNL. No retractions.. Cardiovascular Pedal Pulses WNL. No clubbing, cyanosis or edema. Lymphatic No adneopathy. No adenopathy. No adenopathy. Musculoskeletal Adexa without tenderness or enlargement.. Digits and nails w/o clubbing, cyanosis, infection, petechiae, ischemia, or inflammatory conditions.Marland Kitchen Psychiatric Judgement and insight Intact.. No evidence of depression, anxiety, or agitation.. General Notes: the left leg has 3 areas one of them on the posterior aspect is completely healed while the anterior and lateral 1 have minimal  debris. No sharp debridement was required and the wounds were washed out with moist saline Integumentary (Hair, Skin) No suspicious lesions. No crepitus or fluctuance. No peri-wound warmth or erythema. No masses.. Wound #2 status is Open. Original cause of wound was Gradually Appeared. The wound is located on the Left,Circumferential Lower Leg. The wound measures 7cm length x 16cm width x 0.2cm depth; 87.965cm^2 Dacy, Prajwal J. ([id number]) area and 17.593cm^3 volume. There is no tunneling or undermining noted. There is a large amount of serosanguineous drainage noted. The wound margin is distinct with the outline attached to the wound base. There is small (1-33%) pink granulation within the wound bed. There is a large (67-100%) amount of necrotic tissue within the wound bed including Adherent Slough. The periwound skin appearance exhibited: Localized Edema. Periwound temperature was noted as No Abnormality. The periwound has  tenderness on palpation. 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 I87.032 - Postthrombotic syndrome with ulcer and inflammation of left lower extremity F17.218 - Nicotine dependence, cigarettes, with other nicotine-induced disorders Plan Wound Cleansing: Wound #2 Left,Circumferential Lower Leg: Cleanse wound with mild soap and water Anesthetic: Wound #2 Left,Circumferential Lower Leg: Topical Lidocaine 4% cream applied to wound bed prior to debridement Skin Barriers/Peri-Wound Care: Wound #2 Left,Circumferential Lower Leg: Barrier cream Moisturizing lotion Primary Wound Dressing: Wound #2 Left,Circumferential Lower Leg: Aquacel Ag Secondary Dressing: Wound #2 Left,Circumferential Lower Leg: ABD pad Dry Gauze Dressing Change Frequency: Wound #2 Left,Circumferential Lower Leg: Change dressing every week Follow-up Appointments: Wound #2 Left,Circumferential Lower Leg: Return Appointment in 1 week. Glasby, VIGGO PERKO ([id number]) Edema Control: Wound #2 Left,Circumferential Lower Leg: 4 Layer Compression System - Bilateral - unna to anchor 3cm from knee and 3 cm from toes Elevate legs to the level of the heart and pump ankles as often as possible Additional Orders / Instructions: Wound #2 Left,Circumferential Lower Leg: Stop Smoking Increase protein intake. Besides encouraging elevation and exercise I have recommended Aquacel Ag with a 4-layer Profore wrap, his left lower extremity. We will cancel the home health nurse and he will come regularly for wound care visits once a week here. He has got his juxta light stocking and we will use this on his right leg, which is completely healed. Electronic Signature(s) Signed: 06/18/2016 3:30:13 PM By: Evlyn Kanner MD, FACS Entered By: Evlyn Kanner on 06/18/2016 15:30:13 Yzaguirre, Nolon [name]  ([id number]) -------------------------------------------------------------------------------- SuperBill Details Patient Name: [name] Date of Service: 06/18/2016 Medical Record Number: [id number] Patient Account Number: [id number] Date of Birth/Sex: 09-04-1961 (55 y.o. 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 I87.032 Postthrombotic syndrome with ulcer and inflammation of left lower extremity F17.218 Nicotine dependence, cigarettes, with other nicotine-induced disorders Facility Procedures CPT4: Description Modifier Quantity Code [id number] (Facility Use Only) [phone] - APPLY MULTLAY COMPRS LWR LT 1 LEG Physician Procedures CPT4: Description Modifier Quantity Code [id number] - WC PHYS LEVEL 3 - EST PT 1 ICD-10 Description Diagnosis I89.0 Lymphedema, not elsewhere classified 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

## 2016-06-19 NOTE — Progress Notes (Signed)
MEDFORD, STAHELI ([id number]) Visit Report for 06/18/2016 Arrival Information Details Patient Name: [name], [name] Date of Service: 06/18/2016 3:00 PM Medical Record Number: [id number] Patient Account Number: [id number] Date of Birth/Sex: 1961/12/08 (55 y.o. Male) Treating RN: Phillis Haggis Primary Care Physician: Corky Downs Other Clinician: Referring Physician: Corky Downs Treating Physician/Extender: Rudene Re in Treatment: 8 Visit Information History Since Last Visit All ordered tests and consults were completed: No Patient Arrived: Ambulatory Added or deleted any medications: No Arrival Time: 14:55 Any new allergies or adverse reactions: No Accompanied By: girlfriend Had a fall or experienced change in No Transfer Assistance: None activities of daily living that may affect Patient Identification Verified: Yes risk of falls: Secondary Verification Process Yes Signs or symptoms of abuse/neglect since last No Completed: visito Patient Requires Transmission- No Hospitalized since last visit: No Based Precautions: Pain Present Now: No Patient Has Alerts: Yes Patient Alerts: Patient on Blood Thinner [name] Electronic Signature(s) Signed: 06/18/2016 4:27:53 PM By: Alejandro Mulling Entered By: Alejandro Mulling on 06/18/2016 14:55:28 Brigance, [name] ([id number]) -------------------------------------------------------------------------------- Encounter Discharge Information Details Patient Name: [name] Date of Service: 06/18/2016 3:00 PM Medical Record Number: [id number] Patient Account Number: [id number] Date of Birth/Sex: 28-Oct-1961 (55 y.o. Male) Treating RN: Phillis Haggis Primary Care Physician: Corky Downs Other Clinician: Referring Physician: Corky Downs Treating Physician/Extender: Rudene Re in Treatment: 8 Encounter Discharge Information Items Discharge Pain Level: 0 Discharge Condition: Stable Ambulatory Status:  Ambulatory Discharge Destination: Home Transportation: Private Auto Accompanied By: girlfriend Schedule Follow-up Appointment: Yes Medication Reconciliation completed and provided to Patient/Care Yes [name]: Provided on Clinical Summary of Care: 06/18/2016 Form Type Recipient Paper Patient MD Electronic Signature(s) Signed: 06/18/2016 3:36:07 PM By: Gwenlyn Perking Entered By: Gwenlyn Perking on 06/18/2016 15:36:07 Lewers, [name] ([id number]) -------------------------------------------------------------------------------- Lower Extremity Assessment Details Patient Name: [name] Date of Service: 06/18/2016 3:00 PM Medical Record Number: [id number] Patient Account Number: [id number] Date of Birth/Sex: 07-21-1961 (55 y.o. Male) Treating RN: Phillis Haggis Primary Care Physician: Corky Downs Other Clinician: Referring Physician: Corky Downs Treating Physician/Extender: Rudene Re in Treatment: 8 Edema Assessment Assessed: [Left: No] [Right: No] E[Left: dema] [Right: :] Calf Left: Right: Point of Measurement: 38 cm From Medial Instep 47.2 cm cm Ankle Left: Right: Point of Measurement: 13 cm From Medial Instep 28.2 cm cm Vascular Assessment Pulses: Posterior Tibial Dorsalis Pedis Palpable: [Left:Yes] Extremity colors, hair growth, and conditions: Extremity Color: [Left:Hyperpigmented] Temperature of Extremity: [Left:Warm] Capillary Refill: [Left:< 3 seconds] Electronic Signature(s) Signed: 06/18/2016 4:27:53 PM By: Alejandro Mulling Entered By: Alejandro Mulling on 06/18/2016 15:10:10 Keirsey, [name] ([id number]) -------------------------------------------------------------------------------- Multi Wound Chart Details Patient Name: [name] Date of Service: 06/18/2016 3:00 PM Medical Record Number: [id number] Patient Account Number: [id number] Date of Birth/Sex: November 23, 1961 (55 y.o. Male) Treating RN: Ashok Cordia, Debi Primary Care Physician:  Corky Downs Other Clinician: Referring Physician: Corky Downs Treating Physician/Extender: Rudene Re in Treatment: 8 Vital Signs Height(in): 76 Pulse(bpm): 85 Weight(lbs): 320 Blood Pressure 136/101 (mmHg): Body Mass Index(BMI): 39 Temperature(F): 97.7 Respiratory Rate 20 (breaths/min): Photos: [2:No Photos] [N/A:N/A] Wound Location: [2:Left Lower Leg - Circumfernential] [N/A:N/A] Wounding Event: [2:Gradually Appeared] [N/A:N/A] Primary Etiology: [2:Venous Leg Ulcer] [N/A:N/A] Comorbid History: [2:Arrhythmia, Hypertension] [N/A:N/A] Date Acquired: [2:12/14/2015] [N/A:N/A] Weeks of Treatment: [2:8] [N/A:N/A] Wound Status: [2:Open] [N/A:N/A] Measurements L x W x D 7x16x0.2 [N/A:N/A] (cm) Area (cm) : [2:87.965] [N/A:N/A] Volume (cm) : [2:17.593] [N/A:N/A] % Reduction in Area: [2:80.70%] [N/A:N/A] % Reduction in Volume: 61.50% [N/A:N/A] Classification: [2:Partial Thickness] [N/A:N/A] Exudate Amount: [  2:Large] [N/A:N/A] Exudate Type: [2:Serosanguineous] [N/A:N/A] Exudate Color: [2:red, brown] [N/A:N/A] Wound Margin: [2:Distinct, outline attached] [N/A:N/A] Granulation Amount: [2:Small (1-33%)] [N/A:N/A] Granulation Quality: [2:Pink] [N/A:N/A] Necrotic Amount: [2:Large (67-100%)] [N/A:N/A] Epithelialization: [2:Large (67-100%)] [N/A:N/A] Periwound Skin Texture: Edema: Yes [N/A:N/A] Periwound Skin [2:No Abnormalities Noted] [N/A:N/A] Moisture: Periwound Skin Color: No Abnormalities Noted [N/A:N/A] Temperature: [2:No Abnormality] [N/A:N/A] Tenderness on Yes N/A N/A Palpation: Wound Preparation: Ulcer Cleansing: Other: N/A N/A soap and water Topical Anesthetic Applied: Other: lidocaine 4% Treatment Notes Electronic Signature(s) Signed: 06/18/2016 4:27:53 PM By: Alejandro Mulling Entered By: Alejandro Mulling on 06/18/2016 15:15:29 Felber, [name]  ([id number]) -------------------------------------------------------------------------------- Multi-Disciplinary Care Plan Details Patient Name: [name] Date of Service: 06/18/2016 3:00 PM Medical Record Number: [id number] Patient Account Number: [id number] Date of Birth/Sex: [birth year] (55 y.o. Male) Treating RN: Phillis Haggis Primary Care Physician: Corky Downs Other Clinician: Referring Physician: Corky Downs Treating Physician/Extender: Rudene Re in Treatment: 8 Active Inactive Orientation to the Wound Care Program Nursing Diagnoses: Knowledge deficit related to the wound healing center program Goals: Patient/caregiver will verbalize understanding of the Wound Healing Center Program Date Initiated: 04/23/2016 Goal Status: Active Interventions: Provide education on orientation to the wound center Notes: Pain, Acute or Chronic Nursing Diagnoses: Pain, acute or chronic: actual or potential Potential alteration in comfort, pain Goals: Patient will verbalize adequate pain control and receive pain control interventions during procedures as needed Date Initiated: 04/23/2016 Goal Status: Active Patient/caregiver will verbalize adequate pain control between visits Date Initiated: 04/23/2016 Goal Status: Active Interventions: Assess comfort goal upon admission Complete pain assessment as per visit requirements Notes: Wound/Skin Impairment Senner, Stirling Shela Commons ([id number]) Nursing Diagnoses: Impaired tissue integrity Knowledge deficit related to smoking impact on wound healing Knowledge deficit related to ulceration/compromised skin integrity Goals: Ulcer/skin breakdown will have a volume reduction of 30% by week 4 Date Initiated: 04/23/2016 Goal Status: Active Ulcer/skin breakdown will have a volume reduction of 50% by week 8 Date Initiated: 04/23/2016 Goal Status: Active Ulcer/skin breakdown will have a volume reduction of 80% by week 12 Date  Initiated: 04/23/2016 Goal Status: Active Interventions: Assess ulceration(s) every visit Notes: Electronic Signature(s) Signed: 06/18/2016 4:27:53 PM By: Alejandro Mulling Entered By: Alejandro Mulling on 06/18/2016 15:15:20 Scherer, [name] ([id number]) -------------------------------------------------------------------------------- Pain Assessment Details Patient Name: [name] Date of Service: 06/18/2016 3:00 PM Medical Record Number: [id number] Patient Account Number: [id number] Date of Birth/Sex: 10/25/1961 (55 y.o. Male) Treating RN: Phillis Haggis Primary Care Physician: Corky Downs Other Clinician: Referring Physician: Corky Downs Treating Physician/Extender: Rudene Re in Treatment: 8 Active Problems Location of Pain Severity and Description of Pain Patient Has Paino No Site Locations With Dressing Change: No Pain Management and Medication Current Pain Management: Electronic Signature(s) Signed: 06/18/2016 4:27:53 PM By: Alejandro Mulling Entered By: Alejandro Mulling on 06/18/2016 14:55:35 Molenda, [name] ([id number]) -------------------------------------------------------------------------------- Patient/Caregiver Education Details Patient Name: [name] Date of Service: 06/18/2016 3:00 PM Medical Record Number: [id number] Patient Account Number: [id number] Date of Birth/Gender: 12-22-1960 (54 y.o. Male) Treating RN: Phillis Haggis Primary Care Physician: Corky Downs Other Clinician: Referring Physician: Corky Downs Treating Physician/Extender: Rudene Re in Treatment: 8 Education Assessment Education Provided To: Patient Education Topics Provided Wound/Skin Impairment: Handouts: Other: do not get wrap wet Methods: Demonstration, Explain/Verbal Responses: State content correctly Electronic Signature(s) Signed: 06/18/2016 4:27:53 PM By: Alejandro Mulling Entered By: Alejandro Mulling on 06/18/2016 15:19:12 Raz,  [name] ([id number]) -------------------------------------------------------------------------------- Wound Assessment Details Patient Name: [name] Date of Service: 06/18/2016 3:00 PM Medical Record Number: [id number] Patient  Account Number: [id number] Date of Birth/Sex: 03-01-1961 [age] y.o. Male) Treating RN: Ashok CordiaPinkerton, Debi Primary Care Physician: Corky DownsMASOUD, [name] Other Clinician: Referring Physician: Corky DownsMASOUD, [name] Treating Physician/Extender: Rudene ReBritto, Errol Weeks in Treatment: 8 Wound Status Wound Number: 2 Primary Etiology: Venous Leg Ulcer Wound Location: Left Lower Leg - Wound Status: Open Circumfernential Comorbid History: Arrhythmia, Hypertension Wounding Event: Gradually Appeared Date Acquired: 12/14/2015 Weeks Of Treatment: 8 Clustered Wound: No Photos Photo Uploaded By: Alejandro MullingPinkerton, Debra on 06/18/2016 16:00:40 Wound Measurements Length: (cm) 7 Width: (cm) 16 Depth: (cm) 0.2 Area: (cm) 87.965 Volume: (cm) 17.593 % Reduction in Area: 80.7% % Reduction in Volume: 61.5% Epithelialization: Large (67-100%) Tunneling: No Undermining: No Wound Description Classification: Partial Thickness Wound Margin: Distinct, outline attached Exudate Amount: Large Exudate Type: Serosanguineous Exudate Color: red, brown Foul Odor After Cleansing: No Wound Bed Granulation Amount: Small (1-33%) Granulation Quality: Pink Necrotic Amount: Large (67-100%) Gaskill, Makya J. ([id number]) Necrotic Quality: Adherent Slough Periwound Skin Texture Texture Color No Abnormalities Noted: No No Abnormalities Noted: No Localized Edema: Yes Temperature / Pain Moisture Temperature: No Abnormality No Abnormalities Noted: No Tenderness on Palpation: Yes Wound Preparation Ulcer Cleansing: Other: soap and water, Topical Anesthetic Applied: Other: lidocaine 4%, Treatment Notes Wound #2 (Left, Circumferential Lower Leg) 1. Cleansed with: Cleanse wound with antibacterial soap and  water 2. Anesthetic Topical Lidocaine 4% cream to wound bed prior to debridement 3. Peri-wound Care: Barrier cream Moisturizing lotion 4. Dressing Applied: Aquacel Ag 5. Secondary Dressing Applied ABD Pad Dry Gauze 7. Secured with Tape 4-Layer Compression System - Left Lower Extremity Notes unna to anchor Electronic Signature(s) Signed: 06/18/2016 4:27:53 PM By: Alejandro MullingPinkerton, Debra Entered By: Alejandro MullingPinkerton, Debra on 06/18/2016 15:12:02 Bramblett, [name] ([id number]) -------------------------------------------------------------------------------- Vitals Details Patient Name: [name] RouteAMERON, Ivar J. Date of Service: 06/18/2016 3:00 PM Medical Record Number: [id number] Patient Account Number: [id number] Date of Birth/Sex: 03-01-1961 [age] y.o. Male) Treating RN: Phillis HaggisPinkerton, Debi Primary Care Physician: Corky DownsMASOUD, [name] Other Clinician: Referring Physician: Corky DownsMASOUD, [name] Treating Physician/Extender: Rudene ReBritto, Errol Weeks in Treatment: 8 Vital Signs Time Taken: 14:55 Temperature (F): 97.7 Height (in): 76 Pulse (bpm): 85 Weight (lbs): 320 Respiratory Rate (breaths/min): 20 Body Mass Index (BMI): 38.9 Blood Pressure (mmHg): 136/101 Reference Range: 80 - 120 mg / dl Electronic Signature(s) Signed: 06/18/2016 4:27:53 PM By: Alejandro MullingPinkerton, Debra Entered By: Alejandro MullingPinkerton, Debra on 06/18/2016 14:57:22

## 2016-06-25 ENCOUNTER — Encounter (HOSPITAL_BASED_OUTPATIENT_CLINIC_OR_DEPARTMENT_OTHER): Payer: BC Managed Care – PPO | Admitting: General Surgery

## 2016-06-25 DIAGNOSIS — I83222 Varicose veins of left lower extremity with both ulcer of calf and inflammation: Secondary | ICD-10-CM

## 2016-06-25 DIAGNOSIS — L97229 Non-pressure chronic ulcer of left calf with unspecified severity: Principal | ICD-10-CM

## 2016-06-25 DIAGNOSIS — I890 Lymphedema, not elsewhere classified: Secondary | ICD-10-CM | POA: Diagnosis not present

## 2016-06-25 NOTE — Progress Notes (Signed)
Left leg has several venous ulcers of calf area.  Debrided and UNNA applied

## 2016-06-26 NOTE — Progress Notes (Signed)
[name], Demarques J. ([id number]) Visit Report for 06/25/2016 Arrival Information Details Patient Name: [name], [name] J. Date of Service: 06/25/2016 2:15 PM Medical Record Number: [id number] Patient Account Number: [id number] Date of Birth/Sex: [birth year] (54 y.o. Male) Treating RN: Phillis HaggisPinkerton, Debi Primary Care Physician: Corky DownsMASOUD, [name] Other Clinician: Referring Physician: Corky DownsMASOUD, [name] Treating Physician/Extender: Elayne SnarePARKER, PETER Weeks in Treatment: 9 Visit Information History Since Last Visit All ordered tests and consults were completed: No Patient Arrived: Ambulatory Added or deleted any medications: No Arrival Time: 14:19 Any new allergies or adverse reactions: No Accompanied By: self Had a fall or experienced change in No Transfer Assistance: None activities of daily living that may affect Patient Identification Verified: Yes risk of falls: Secondary Verification Process Yes Signs or symptoms of abuse/neglect since last No Completed: visito Patient Requires Transmission- No Hospitalized since last visit: No Based Precautions: Pain Present Now: No Patient Has Alerts: Yes Patient Alerts: Patient on Blood Thinner [name] Electronic Signature(s) Signed: 06/25/2016 4:39:38 PM By: Alejandro MullingPinkerton, Debra Entered By: Alejandro MullingPinkerton, Debra on 06/25/2016 14:20:07 [name], [name] ([id number]) -------------------------------------------------------------------------------- Encounter Discharge Information Details Patient Name: [name], [name] J. Date of Service: 06/25/2016 2:15 PM Medical Record Number: [id number] Patient Account Number: [id number] Date of Birth/Sex: [birth year] [age] y.o. Male) Treating RN: Phillis HaggisPinkerton, Debi Primary Care Physician: Corky DownsMASOUD, [name] Other Clinician: Referring Physician: Corky DownsMASOUD, [name] Treating Physician/Extender: Elayne SnarePARKER, PETER Weeks in Treatment: 9 Encounter Discharge Information Items Discharge Pain Level: 0 Discharge Condition: Stable Ambulatory Status:  Ambulatory Discharge Destination: Home Transportation: Private Auto Accompanied By: self Schedule Follow-up Appointment: Yes Medication Reconciliation completed and provided to Patient/Care Yes [name]: Provided on Clinical Summary of Care: 06/25/2016 Form Type Recipient Paper Patient MD Electronic Signature(s) Signed: 06/25/2016 4:31:49 PM By: Ardath SaxParker, Peter MD Previous Signature: 06/25/2016 2:55:38 PM Version By: Gwenlyn PerkingMoore, Shelia Entered By: Ardath SaxParker, Peter on 06/25/2016 16:31:49 [place], [name] ([id number]) -------------------------------------------------------------------------------- Lower Extremity Assessment Details Patient Name: [name], [name] J. Date of Service: 06/25/2016 2:15 PM Medical Record Number: [id number] Patient Account Number: [id number] Date of Birth/Sex: [birth year](54 y.o. Male) Treating RN: Phillis HaggisPinkerton, Debi Primary Care Physician: Corky DownsMASOUD, [name] Other Clinician: Referring Physician: Corky DownsMASOUD, [name] Treating Physician/Extender: Elayne SnarePARKER, PETER Weeks in Treatment: 9 Edema Assessment Assessed: [Left: No] [Right: No] E[Left: dema] [Right: :] Calf Left: Right: Point of Measurement: 38 cm From Medial Instep 47.6 cm cm Ankle Left: Right: Point of Measurement: 13 cm From Medial Instep 28.4 cm cm Vascular Assessment Pulses: Posterior Tibial Dorsalis Pedis Palpable: [Left:Yes] Extremity colors, hair growth, and conditions: Extremity Color: [Left:Hyperpigmented] Temperature of Extremity: [Left:Warm] Capillary Refill: [Left:< 3 seconds] Toe Nail Assessment Left: Right: Thick: Yes Discolored: Yes Deformed: No Improper Length and Hygiene: Yes Electronic Signature(s) Signed: 06/25/2016 4:39:38 PM By: Alejandro MullingPinkerton, Debra Entered By: Alejandro MullingPinkerton, Debra on 06/25/2016 14:31:07 [name], [name] ([id number]) -------------------------------------------------------------------------------- Multi Wound Chart Details Patient Name: [name], [name] J. Date of Service:  06/25/2016 2:15 PM Medical Record Number: [id number] Patient Account Number: [id number] Date of Birth/Sex: [birth year] [age] y.o. Male) Treating RN: Ashok CordiaPinkerton, Debi Primary Care Physician: Corky DownsMASOUD, [name] Other Clinician: Referring Physician: Corky DownsMASOUD, [name] Treating Physician/Extender: Elayne SnarePARKER, PETER Weeks in Treatment: 9 Vital Signs Height(in): 76 Pulse(bpm): 60 Weight(lbs): 320 Blood Pressure 139/99 (mmHg): Body Mass Index(BMI): 39 Temperature(F): 98.0 Respiratory Rate 20 (breaths/min): Photos: [2:No Photos] [N/A:N/A] Wound Location: [2:Left Lower Leg - Circumfernential] [N/A:N/A] Wounding Event: [2:Gradually Appeared] [N/A:N/A] Primary Etiology: [2:Venous Leg Ulcer] [N/A:N/A] Comorbid History: [2:Arrhythmia, Hypertension] [N/A:N/A] Date Acquired: [2:12/14/2015] [N/A:N/A] Weeks of Treatment: [2:9] [N/A:N/A] Wound Status: [2:Open] [N/A:N/A] Measurements L x W x D 3.5x13.5x0.2 [N/A:N/A] (cm) Area (cm) : [  2:37.11] [N/A:N/A] Volume (cm) : [2:7.422] [N/A:N/A] % Reduction in Area: [2:91.90%] [N/A:N/A] % Reduction in Volume: 83.70% [N/A:N/A] Classification: [2:Partial Thickness] [N/A:N/A] Exudate Amount: [2:Large] [N/A:N/A] Exudate Type: [2:Serosanguineous] [N/A:N/A] Exudate Color: [2:red, brown] [N/A:N/A] Wound Margin: [2:Distinct, outline attached] [N/A:N/A] Granulation Amount: [2:Small (1-33%)] [N/A:N/A] Granulation Quality: [2:Pink] [N/A:N/A] Necrotic Amount: [2:Large (67-100%)] [N/A:N/A] Epithelialization: [2:Large (67-100%)] [N/A:N/A] Periwound Skin Texture: Edema: Yes [N/A:N/A] Periwound Skin [2:No Abnormalities Noted] [N/A:N/A] Moisture: Periwound Skin Color: No Abnormalities Noted [N/A:N/A] Temperature: [2:No Abnormality] [N/A:N/A] Tenderness on Yes N/A N/A Palpation: Wound Preparation: Ulcer Cleansing: Other: N/A N/A soap and water Topical Anesthetic Applied: Other: lidocaine 4% Treatment Notes Electronic Signature(s) Signed: 06/25/2016 4:39:38 PM By:  Alejandro Mulling Entered By: Alejandro Mulling on 06/25/2016 14:33:22 [name], [name] ([id number]) -------------------------------------------------------------------------------- Multi-Disciplinary Care Plan Details Patient Name: [name] Date of Service: 06/25/2016 2:15 PM Medical Record Number: [id number] Patient Account Number: [id number] Date of Birth/Sex: 03-31-1961 (55 y.o. Male) Treating RN: Phillis Haggis Primary Care Physician: Corky Downs Other Clinician: Referring Physician: Corky Downs Treating Physician/Extender: Elayne Snare in Treatment: 9 Active Inactive Orientation to the Wound Care Program Nursing Diagnoses: Knowledge deficit related to the wound healing center program Goals: Patient/caregiver will verbalize understanding of the Wound Healing Center Program Date Initiated: 04/23/2016 Goal Status: Active Interventions: Provide education on orientation to the wound center Notes: Pain, Acute or Chronic Nursing Diagnoses: Pain, acute or chronic: actual or potential Potential alteration in comfort, pain Goals: Patient will verbalize adequate pain control and receive pain control interventions during procedures as needed Date Initiated: 04/23/2016 Goal Status: Active Patient/caregiver will verbalize adequate pain control between visits Date Initiated: 04/23/2016 Goal Status: Active Interventions: Assess comfort goal upon admission Complete pain assessment as per visit requirements Notes: Wound/Skin Impairment [name], [name] ([id number]) Nursing Diagnoses: Impaired tissue integrity Knowledge deficit related to smoking impact on wound healing Knowledge deficit related to ulceration/compromised skin integrity Goals: Ulcer/skin breakdown will have a volume reduction of 30% by week 4 Date Initiated: 04/23/2016 Goal Status: Active Ulcer/skin breakdown will have a volume reduction of 50% by week 8 Date Initiated: 04/23/2016 Goal Status:  Active Ulcer/skin breakdown will have a volume reduction of 80% by week 12 Date Initiated: 04/23/2016 Goal Status: Active Interventions: Assess ulceration(s) every visit Notes: Electronic Signature(s) Signed: 06/25/2016 4:39:38 PM By: Alejandro Mulling Entered By: Alejandro Mulling on 06/25/2016 14:33:05 [name], [name] ([id number]) -------------------------------------------------------------------------------- Pain Assessment Details Patient Name: [name] Date of Service: 06/25/2016 2:15 PM Medical Record Number: [id number] Patient Account Number: [id number] Date of Birth/Sex: 03/14/1961 (55 y.o. Male) Treating RN: Phillis Haggis Primary Care Physician: Corky Downs Other Clinician: Referring Physician: Corky Downs Treating Physician/Extender: Elayne Snare in Treatment: 9 Active Problems Location of Pain Severity and Description of Pain Patient Has Paino No Site Locations With Dressing Change: No Pain Management and Medication Current Pain Management: Notes Topical or injectable lidocaine is offered to patient for acute pain when surgical debridement is performed. If needed, Patient is instructed to use over the counter pain medication for the following 24-48 hours after debridement. Wound care MDs do not prescribed pain medications. Electronic Signature(s) Signed: 06/25/2016 4:39:38 PM By: Alejandro Mulling Entered By: Alejandro Mulling on 06/25/2016 14:21:23 [name], [name] ([id number]) -------------------------------------------------------------------------------- Patient/Caregiver Education Details Patient Name: [name] Date of Service: 06/25/2016 2:15 PM Medical Record Number: [id number] Patient Account Number: [id number] Date of Birth/Gender: [birth year] (55 y.o. Male) Treating RN: Phillis Haggis Primary Care Physician: Corky Downs Other Clinician: Referring Physician: Corky Downs Treating Physician/Extender: Ardath Sax  Weeks in Treatment: 9 Education Assessment Education Provided To: Patient Education Topics Provided Wound/Skin Impairment: Handouts: Other: do not get wrap wet Methods: Demonstration, Explain/Verbal Responses: State content correctly Electronic Signature(s) Signed: 06/25/2016 4:57:40 PM By: Ardath Sax MD Entered By: Ardath Sax on 06/25/2016 16:31:56 Pelph[place], [name] ([id number]) -------------------------------------------------------------------------------- Wound Assessment Details Patient Name: [name] Date of Service: 06/25/2016 2:15 PM Medical Record Number: [id number] Patient Account Number: [id number] Date of Birth/Sex: 1961-08-28 (55 y.o. Male) Treating RN: Phillis Haggis Primary Care Physician: Corky Downs Other Clinician: Referring Physician: Corky Downs Treating Physician/Extender: Elayne Snare in Treatment: 9 Wound Status Wound Number: 2 Primary Etiology: Venous Leg Ulcer Wound Location: Left Lower Leg - Wound Status: Open Circumfernential Comorbid History: Arrhythmia, Hypertension Wounding Event: Gradually Appeared Date Acquired: 12/14/2015 Weeks Of Treatment: 9 Clustered Wound: No Photos Photo Uploaded By: Alejandro Mulling on 06/25/2016 16:34:06 Wound Measurements Length: (cm) 3.5 Width: (cm) 13.5 Depth: (cm) 0.2 Area: (cm) 37.11 Volume: (cm) 7.422 % Reduction in Area: 91.9% % Reduction in Volume: 83.7% Epithelialization: Large (67-100%) Tunneling: No Undermining: No Wound Description Classification: Partial Thickness Wound Margin: Distinct, outline attached Exudate Amount: Large Exudate Type: Serosanguineous Exudate Color: red, brown Foul Odor After Cleansing: No Wound Bed Granulation Amount: Small (1-33%) Granulation Quality: Pink Necrotic Amount: Large (67-100%) Faulconer, Corbyn J. ([id number]) Necrotic Quality: Adherent Slough Periwound Skin Texture Texture Color No Abnormalities Noted: No No  Abnormalities Noted: No Localized Edema: Yes Temperature / Pain Moisture Temperature: No Abnormality No Abnormalities Noted: No Tenderness on Palpation: Yes Wound Preparation Ulcer Cleansing: Other: soap and water, Topical Anesthetic Applied: Other: lidocaine 4%, Treatment Notes Wound #2 (Left, Circumferential Lower Leg) 1. Cleansed with: Cleanse wound with antibacterial soap and water 2. Anesthetic Topical Lidocaine 4% cream to wound bed prior to debridement 4. Dressing Applied: Aquacel Ag 5. Secondary Dressing Applied ABD Pad 7. Secured with Tape 4-Layer Compression System - Left Lower Extremity Notes unna to anchor Electronic Signature(s) Signed: 06/25/2016 4:39:38 PM By: Alejandro Mulling Entered By: Alejandro Mulling on 06/25/2016 14:32:25 Ken, [name] ([id number]) -------------------------------------------------------------------------------- Vitals Details Patient Name: [name] Date of Service: 06/25/2016 2:15 PM Medical Record Number: [id number] Patient Account Number: [id number] Date of Birth/Sex: [birth year] (55 y.o. Male) Treating RN: Ashok Cordia, Debi Primary Care Physician: Corky Downs Other Clinician: Referring Physician: Corky Downs Treating Physician/Extender: Elayne Snare in Treatment: 9 Vital Signs Time Taken: 14:21 Temperature (F): 98.0 Height (in): 76 Pulse (bpm): 60 Weight (lbs): 320 Respiratory Rate (breaths/min): 20 Body Mass Index (BMI): 38.9 Blood Pressure (mmHg): 139/99 Reference Range: 80 - 120 mg / dl Electronic Signature(s) Signed: 06/25/2016 4:39:38 PM By: Alejandro Mulling Entered By: Alejandro Mulling on 06/25/2016 14:22:08

## 2016-06-26 NOTE — Progress Notes (Addendum)
[name], Stan J. ([id number]) Visit Report for 06/25/2016 Chief Complaint Document Details Patient Name: [name], [name] J. Date of Service: 06/25/2016 2:15 PM Medical Record Number: [id number] Patient Account Number: [id number] Date of Birth/Sex: Feb 29, 1961 (55 y.o. Male) Treating RN: Phillis HaggisPinkerton, Debi Primary Care Physician: Corky DownsMASOUD, [name] Other Clinician: Referring Physician: Corky DownsMASOUD, [name] Treating Physician/Extender: Elayne SnarePARKER, Brayson Livesey Weeks in Treatment: 9 Information Obtained from: Patient Chief Complaint Patient presents to the wound care center for a consult due non healing wound to both lower extremities and accompanied by swelling and this has been worse for the last 5 months. He says the swelling of both lower extremity has been there for at least 6-7 years. Electronic Signature(s) Signed: 06/25/2016 4:28:54 PM By: Ardath SaxParker, Zyron Deeley MD Entered By: Ardath SaxParker, Bracy Pepper on 06/25/2016 16:28:53 Kouns, [name] ([id number]) -------------------------------------------------------------------------------- Debridement Details Patient Name: [name], Mang J. Date of Service: 06/25/2016 2:15 PM Medical Record Number: [id number] Patient Account Number: [id number] Date of Birth/Sex: Feb 29, 1961 [age] y.o. Male) Treating RN: Phillis HaggisPinkerton, Debi Primary Care Physician: Corky DownsMASOUD, [name] Other Clinician: Referring Physician: Corky DownsMASOUD, [name] Treating Physician/Extender: Elayne SnarePARKER, Sarea Fyfe Weeks in Treatment: 9 Debridement Performed for Wound #2 Left,Circumferential Lower Leg Assessment: Performed By: Physician Ardath SaxPARKER, Ralph Benavidez, MD Debridement: Debridement Pre-procedure Yes Verification/Time Out Taken: Start Time: 14:37 Pain Control: Other : lidocaine 4% cream Level: Skin/Subcutaneous Tissue Total Area Debrided (L x 0.5 (cm) x 0.5 (cm) = 0.25 (cm) W): Tissue and other Viable, Non-Viable, Exudate, Fibrin/Slough, Subcutaneous material debrided: Instrument: Curette Bleeding: Minimum Hemostasis Achieved:  Pressure End Time: 14:39 Procedural Pain: 0 Post Procedural Pain: 0 Response to Treatment: Procedure was tolerated well Post Debridement Measurements of Total Wound Length: (cm) 0.5 Width: (cm) 0.5 Depth: (cm) 0.2 Volume: (cm) 0.039 Post Procedure Diagnosis Same as Pre-procedure Electronic Signature(s) Signed: 06/25/2016 4:39:38 PM By: Alejandro MullingPinkerton, Debra Signed: 06/25/2016 4:57:40 PM By: Ardath SaxParker, Shandi Godfrey MD Entered By: Alejandro MullingPinkerton, Debra on 06/25/2016 14:42:51 [name], [name] ([id number]) -------------------------------------------------------------------------------- HPI Details Patient Name: [name], [name] J. Date of Service: 06/25/2016 2:15 PM Medical Record Number: [id number] Patient Account Number: [id number] Date of Birth/Sex: Feb 29, 1961 [age] y.o. Male) Treating RN: Phillis HaggisPinkerton, Debi Primary Care Physician: Corky DownsMASOUD, [name] Other Clinician: Referring Physician: Corky DownsMASOUD, [name] Treating Physician/Extender: Elayne SnarePARKER, Daksh Coates Weeks in Treatment: 9 History of Present Illness Location: ulceration and weeping of both lower extremities left more than the right Quality: Patient reports experiencing heaviness to affected area(s). Severity: Patient states wound are getting worse. Duration: Patient has had the wound for > 5 months prior to seeking treatment at the wound center Timing: Pain in wound is Intermittent (comes and goes Context: The wound would happen gradually Modifying Factors: Other treatment(s) tried include:has been having weekly wraps applied to both lower extremities at Dr. Zannie KehrShankar's office Associated Signs and Symptoms: Patient reports having increase swelling. HPI Description: 55 year old gentleman who has been treated in the past for venous ulcers of the lower extremity is also known to be a smoker for the last 20 years and smokes about a pack of cigarettes a day. He has been seen by Dr. Evette CristalSankar who has been treating left lower extremity venous ulcer with an Unna boot and have  consulted vascular surgery to be seen by Dr. Gilda CreaseSchnier. the patient was also recently admitted to the hospital on 04/17/2016 and discharged on 04/19/2016 with bleeding, hyponatremia, cellulitis of the right leg, laceration of the right foot. 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 He was admitted to hospital with significant bleeding from the ulcer and initially started on  IV antibiotics for underlying cellulitis which was suspected. Patient was seen by Dr. Evie Lacks of surgery and Dr Alberteen Spindle of podiatry. Dr. Graciela Husbands had done excisional debridement of some of the superficial skin slough as well as the ulcerative area on the posterior aspect of the left ankle region. Unna's boots was applied. He was asked to follow-up at the wound center. He is on Xarelto for his chronic atrial fibrillation. 04/30/2016 -- the patient was seen by the PA at the [facility] vascular practice who did a consultation but did not have any investigations ordered or done. The patient continues to have a lot of oozing from his wounds. 06/04/2016 -- he has seen his PCP who put him on Lasix 40 mg daily along with potassium. Electronic Signature(s) Signed: 06/25/2016 4:29:14 PM By: Ardath Sax MD Entered By: Ardath Sax on 06/25/2016 16:29:14 [name], [name] ([id number]) -------------------------------------------------------------------------------- Physical Exam Details Patient Name: [name] Route Date of Service: 06/25/2016 2:15 PM Medical Record Number: [id number] Patient Account Number: [id number] Date of Birth/Sex: [birth year] (54 y.o. Male) Treating RN: Phillis Haggis Primary Care Physician: Corky Downs Other Clinician: Referring Physician: Corky Downs Treating Physician/Extender: Elayne Snare in Treatment: 9 Electronic Signature(s) Signed: 06/25/2016 4:29:20 PM By: Ardath Sax MD Entered By: Ardath Sax on  06/25/2016 16:29:19 [name], [name] ([id number]) -------------------------------------------------------------------------------- Physician Orders Details Patient Name: [name] Route Date of Service: 06/25/2016 2:15 PM Medical Record Number: [id number] Patient Account Number: [id number] Date of Birth/Sex: 09-12-1961 (55 y.o. Male) Treating RN: Phillis Haggis Primary Care Physician: Corky Downs Other Clinician: Referring Physician: Corky Downs Treating Physician/Extender: Elayne Snare in Treatment: 9 Verbal / Phone Orders: Yes ClinicianAshok Cordia, Debi Read Back and Verified: Yes Diagnosis Coding Wound Cleansing Wound #2 Left,Circumferential Lower Leg o Cleanse wound with mild soap and water Anesthetic Wound #2 Left,Circumferential Lower Leg o Topical Lidocaine 4% cream applied to wound bed prior to debridement Skin Barriers/Peri-Wound Care Wound #2 Left,Circumferential Lower Leg o Barrier cream o Moisturizing lotion Primary Wound Dressing Wound #2 Left,Circumferential Lower Leg o Aquacel Ag Secondary Dressing Wound #2 Left,Circumferential Lower Leg o ABD pad o Dry Gauze Dressing Change Frequency Wound #2 Left,Circumferential Lower Leg o Change dressing every week Follow-up Appointments Wound #2 Left,Circumferential Lower Leg o Return Appointment in 1 week. Edema Control Wound #2 Left,Circumferential Lower Leg o 4 Layer Compression System - Bilateral - unna to anchor 3cm from knee and 3 cm from toes o Elevate legs to the level of the heart and pump ankles as often as possible [name], [name] J. ([id number]) Additional Orders / Instructions Wound #2 Left,Circumferential Lower Leg o Stop Smoking o Increase protein intake. Electronic Signature(s) Signed: 06/25/2016 4:39:38 PM By: Alejandro Mulling Signed: 06/25/2016 4:57:40 PM By: Ardath Sax MD Entered By: Alejandro Mulling on 06/25/2016 14:54:19 [name], [name]  ([id number]) -------------------------------------------------------------------------------- Problem List Details Patient Name: [name] Route Date of Service: 06/25/2016 2:15 PM Medical Record Number: [id number] Patient Account Number: [id number] Date of Birth/Sex: Jun 24, 1961 (55 y.o. 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.032 Postthrombotic syndrome with ulcer and inflammation of 04/23/2016 Yes left lower extremity F17.218 Nicotine dependence, cigarettes, with other nicotine- 04/23/2016 Yes induced disorders Inactive Problems Resolved Problems Electronic Signature(s) Signed: 06/25/2016 4:28:43 PM By: Ardath Sax  MD Entered By: Ardath Sax on 06/25/2016 16:28:43 [name], [name] ([id number]) -------------------------------------------------------------------------------- Progress Note Details Patient Name: [name] Route Date of Service: 06/25/2016 2:15 PM Medical Record Number: [id number] Patient Account Number: [id number] Date of Birth/Sex: October 14, 1961 (55 y.o. Male) Treating RN: Phillis Haggis Primary Care Physician: Corky Downs Other Clinician: Referring Physician: Corky Downs Treating Physician/Extender: Elayne Snare in Treatment: 9 Subjective Chief Complaint Information obtained from Patient Patient presents to the wound care center for a consult due non healing wound to both lower extremities and accompanied by swelling and this has been worse for the last 5 months. He says the swelling of both lower extremity has been there for at least 6-7 years. History of Present  Illness (HPI) The following HPI elements were documented for the patient's wound: Location: ulceration and weeping of both lower extremities left more than the right Quality: Patient reports experiencing heaviness to affected area(s). Severity: Patient states wound are getting worse. Duration: Patient has had the wound for > 5 months prior to seeking treatment at the wound center Timing: Pain in wound is Intermittent (comes and goes Context: The wound would happen gradually Modifying Factors: Other treatment(s) tried include:has been having weekly wraps applied to both lower extremities at Dr. Zannie Kehr office Associated Signs and Symptoms: Patient reports having increase swelling. 55 year old gentleman who has been treated in the past for venous ulcers of the lower extremity is also known to be a smoker for the last 20 years and smokes about a pack of cigarettes a day. He has been seen by Dr. Evette Cristal who has been treating left lower extremity venous ulcer with an Unna boot and have consulted vascular surgery to be seen by Dr. Gilda Crease. the patient was also recently admitted to the hospital on 04/17/2016 and discharged on 04/19/2016 with bleeding, hyponatremia, cellulitis of the right leg, laceration of the right foot. 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 He was admitted to hospital with significant bleeding from the ulcer and initially started on IV antibiotics for underlying cellulitis which was suspected. Patient was seen by Dr. Evie Lacks of surgery and Dr Alberteen Spindle of podiatry. Dr. Graciela Husbands had done excisional debridement of some of the superficial skin slough as well as the ulcerative area on the posterior aspect of the left ankle region. Unna's boots was applied. He was asked to follow-up at the wound center. He is on Xarelto for his chronic atrial fibrillation. 04/30/2016 -- the patient was seen by the PA  at the vein and vascular practice who did a consultation but did not have any investigations ordered or done. The patient continues to have a lot of oozing from his wounds. 06/04/2016 -- he has seen his PCP who put him on Lasix 40 mg daily along with potassium. [name], [name] ([id number]) Objective Constitutional Vitals Time Taken: 2:21 PM, Height: 76 in, Weight: 320 lbs, BMI: 38.9, Temperature: 98.0 F, Pulse: 60 bpm, Respiratory Rate: 20 breaths/min, Blood Pressure: 139/99 mmHg. Integumentary (Hair, Skin) Wound #2 status is Open. Original cause of wound was Gradually Appeared. The wound is located on the Left,Circumferential Lower Leg. The wound measures 3.5cm length x 13.5cm width x 0.2cm depth; 37.11cm^2 area and 7.422cm^3 volume. There is no tunneling or undermining noted. There is a large amount of serosanguineous drainage noted. The wound margin is distinct with the outline attached to the wound base. There is small (1-33%) pink granulation within the wound bed. There is a large (67-100%)  amount of necrotic tissue within the wound bed including Adherent Slough. The periwound skin appearance exhibited: Localized Edema. Periwound temperature was noted as No Abnormality. The periwound has tenderness on palpation. 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 There was a Skin/Subcutaneous Tissue Debridement ([id number]) debridement with total area of 0.25 sq cm [name], [name] J. ([id number]) performed by Ardath Sax, MD. with the  following instrument(s): Curette to remove Viable and Non- Viable tissue/material including Exudate, Fibrin/Slough, and Subcutaneous after achieving pain control using Other (lidocaine 4% cream). A time out was conducted prior to the start of the procedure. A Minimum amount of bleeding was controlled with Pressure. The procedure was tolerated well with a pain level of 0 throughout and a pain level of 0 following the procedure. Post Debridement Measurements: 0.5cm length x 0.5cm width x 0.2cm depth; 0.039cm^3 volume. Post procedure Diagnosis Wound #2: Same as Pre-Procedure Plan Wound Cleansing: Wound #2 Left,Circumferential Lower Leg: Cleanse wound with mild soap and water Anesthetic: Wound #2 Left,Circumferential Lower Leg: Topical Lidocaine 4% cream applied to wound bed prior to debridement Skin Barriers/Peri-Wound Care: Wound #2 Left,Circumferential Lower Leg: Barrier cream Moisturizing lotion Primary Wound Dressing: Wound #2 Left,Circumferential Lower Leg: Aquacel Ag Secondary Dressing: Wound #2 Left,Circumferential Lower Leg: ABD pad Dry Gauze Dressing Change Frequency: Wound #2 Left,Circumferential Lower Leg: Change dressing every week Follow-up Appointments: Wound #2 Left,Circumferential Lower Leg: Return Appointment in 1 week. Edema Control: Wound #2 Left,Circumferential Lower Leg: 4 Layer Compression System - Bilateral - unna to anchor 3cm from knee and 3 cm from toes Elevate legs to the level of the heart and pump ankles as often as possible Additional Orders / Instructions: Wound #2 Left,Circumferential Lower Leg: Stop Smoking Increase protein intake. [name], [name] ([id number]) Follow-Up Appointments: A follow-up appointment should be scheduled. Medication Reconciliation completed and provided to Patient/Care Provider. A Patient Clinical Summary of Care was provided to MD Electronic Signature(s) Signed: 07/02/2016 11:31:50 AM By: Ardath Sax MD Previous  Signature: 06/25/2016 4:30:38 PM Version By: Ardath Sax MD Entered By: Ardath Sax on 07/02/2016 11:31:49 [name], [name] ([id number]) -------------------------------------------------------------------------------- SuperBill Details Patient Name: [name] Route Date of Service: 06/25/2016 Medical Record Number: [id number] Patient Account Number: [id number] Date of Birth/Sex: [birth year] (55 y.o. 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 I87.032 Postthrombotic syndrome with ulcer and inflammation of left lower extremity F17.218 Nicotine dependence, cigarettes, with other nicotine-induced disorders Facility Procedures CPT4 Code: [id number] Description: 11042 - DEB SUBQ TISSUE 20 SQ CM/< ICD-10 Description Diagnosis I89.0 Lymphedema, not elsewhere classified Modifier: Quantity: 1 Physician Procedures CPT4: Description Modifier Quantity Code [id number] 99213 - WC PHYS LEVEL 3 - EST PT 1 ICD-10 Description Diagnosis I87.032 Postthrombotic syndrome with ulcer and inflammation of left lower extremity CPT4: 3474259 11042 - WC PHYS SUBQ TISS 20 SQ CM 1 ICD-10 Description Diagnosis I89.0 Lymphedema, not elsewhere classified Electronic Signature(s) Signed: 06/25/2016 4:31:24 PM By: Ardath Sax MD Entered By: Jimmey Ralph,  Roch Quach on 06/25/2016 16:31:24

## 2016-07-02 ENCOUNTER — Encounter: Payer: Self-pay | Admitting: General Surgery

## 2016-07-02 ENCOUNTER — Encounter (HOSPITAL_BASED_OUTPATIENT_CLINIC_OR_DEPARTMENT_OTHER): Payer: BC Managed Care – PPO | Admitting: General Surgery

## 2016-07-02 DIAGNOSIS — I83223 Varicose veins of left lower extremity with both ulcer of ankle and inflammation: Secondary | ICD-10-CM | POA: Diagnosis not present

## 2016-07-02 DIAGNOSIS — I890 Lymphedema, not elsewhere classified: Secondary | ICD-10-CM | POA: Diagnosis not present

## 2016-07-02 DIAGNOSIS — L97329 Non-pressure chronic ulcer of left ankle with unspecified severity: Principal | ICD-10-CM

## 2016-07-02 NOTE — Progress Notes (Signed)
Venous ulcers almost healed.  Will cover with collagen and use Justalite compression

## 2016-07-09 ENCOUNTER — Encounter: Payer: BC Managed Care – PPO | Admitting: Surgery

## 2016-07-09 DIAGNOSIS — I890 Lymphedema, not elsewhere classified: Secondary | ICD-10-CM | POA: Diagnosis not present

## 2016-07-10 NOTE — Progress Notes (Signed)
[name], [name] ([id number]) Visit Report for 07/09/2016 Arrival Information Details Patient Name: [name], [name] Date of Service: 07/09/2016 1:30 PM Medical Record Number: [id number] Patient Account Number: [id number] Date of Birth/Sex: 1961-07-10 (55 y.o. Male) Treating RN: Phillis Haggis Primary Care Physician: Corky Downs Other Clinician: Referring Physician: Corky Downs Treating Physician/Extender: Rudene Re in Treatment: 11 Visit Information History Since Last Visit All ordered tests and consults were completed: No Patient Arrived: Ambulatory Added or deleted any medications: No Arrival Time: 13:48 Any new allergies or adverse reactions: No Accompanied By: self Had a fall or experienced change in No Transfer Assistance: None activities of daily living that may affect Patient Identification Verified: Yes risk of falls: Secondary Verification Process Yes Signs or symptoms of abuse/neglect since last No Completed: visito Patient Requires Transmission- No Hospitalized since last visit: No Based Precautions: Pain Present Now: No Patient Has Alerts: Yes Patient Alerts: Patient on Blood Thinner [name] Electronic Signature(s) Signed: 07/09/2016 4:45:48 PM By: Alejandro Mulling Entered By: Alejandro Mulling on 07/09/2016 13:49:48 Bergh, [name] ([id number]) -------------------------------------------------------------------------------- Clinic Level of Care Assessment Details Patient Name: [name] Date of Service: 07/09/2016 1:30 PM Medical Record Number: [id number] Patient Account Number: [id number] Date of Birth/Sex: [birth year] (55 y.o. Male) Treating RN: Ashok Cordia, Debi Primary Care Physician: Corky Downs Other Clinician: Referring Physician: Corky Downs Treating Physician/Extender: Rudene Re in Treatment: 11 Clinic Level of Care Assessment Items TOOL 4 Quantity Score X - Use when only an EandM is performed on  FOLLOW-UP visit 1 0 ASSESSMENTS - Nursing Assessment / Reassessment X - Reassessment of Co-morbidities (includes updates in patient status) 1 10 X - Reassessment of Adherence to Treatment Plan 1 5 ASSESSMENTS - Wound and Skin Assessment / Reassessment X - Simple Wound Assessment / Reassessment - one wound 1 5 []  - Complex Wound Assessment / Reassessment - multiple wounds 0 []  - Dermatologic / Skin Assessment (not related to wound area) 0 ASSESSMENTS - Focused Assessment []  - Circumferential Edema Measurements - multi extremities 0 []  - Nutritional Assessment / Counseling / Intervention 0 []  - Lower Extremity Assessment (monofilament, tuning fork, pulses) 0 []  - Peripheral Arterial Disease Assessment (using hand held doppler) 0 ASSESSMENTS - Ostomy and/or Continence Assessment and Care []  - Incontinence Assessment and Management 0 []  - Ostomy Care Assessment and Management (repouching, etc.) 0 PROCESS - Coordination of Care X - Simple Patient / Family Education for ongoing care 1 15 []  - Complex (extensive) Patient / Family Education for ongoing care 0 []  - Staff obtains Chiropractor, Records, Test Results / Process Orders 0 []  - Staff telephones HHA, Nursing Homes / Clarify orders / etc 0 []  - Routine Transfer to another Facility (non-emergent condition) 0 Jon, Kalee J. ([id number]) []  - Routine Hospital Admission (non-emergent condition) 0 []  - New Admissions / Manufacturing engineer / Ordering NPWT, Apligraf, etc. 0 []  - Emergency Hospital Admission (emergent condition) 0 X - Simple Discharge Coordination 1 10 []  - Complex (extensive) Discharge Coordination 0 PROCESS - Special Needs []  - Pediatric / Minor Patient Management 0 []  - Isolation Patient Management 0 []  - Hearing / Language / Visual special needs 0 []  - Assessment of Community assistance (transportation, D/C planning, etc.) 0 []  - Additional assistance / Altered mentation 0 []  - Support Surface(s) Assessment (bed,  cushion, seat, etc.) 0 INTERVENTIONS - Wound Cleansing / Measurement X - Simple Wound Cleansing - one wound 1 5 []  - Complex Wound Cleansing - multiple wounds 0 X - Wound Imaging (photographs -  any number of wounds) 1 5 []  - Wound Tracing (instead of photographs) 0 X - Simple Wound Measurement - one wound 1 5 []  - Complex Wound Measurement - multiple wounds 0 INTERVENTIONS - Wound Dressings X - Small Wound Dressing one or multiple wounds 1 10 []  - Medium Wound Dressing one or multiple wounds 0 []  - Large Wound Dressing one or multiple wounds 0 X - Application of Medications - topical 1 5 []  - Application of Medications - injection 0 INTERVENTIONS - Miscellaneous []  - External ear exam 0 [name], [name] J. ([id number]) []  - Specimen Collection (cultures, biopsies, blood, body fluids, etc.) 0 []  - Specimen(s) / Culture(s) sent or taken to Lab for analysis 0 []  - Patient Transfer (multiple staff / Michiel Sites Lift / Similar devices) 0 []  - Simple Staple / Suture removal (25 or less) 0 []  - Complex Staple / Suture removal (26 or more) 0 []  - Hypo / Hyperglycemic Management (close monitor of Blood Glucose) 0 []  - Ankle / Brachial Index (ABI) - do not check if billed separately 0 X - Vital Signs 1 5 Has the patient been seen at the hospital within the last three years: Yes Total Score: 80 Level Of Care: New/Established - Level 3 Electronic Signature(s) Signed: 07/09/2016 4:45:48 PM By: Alejandro Mulling Entered By: Alejandro Mulling on 07/09/2016 15:32:20 [name], [name] ([id number]) -------------------------------------------------------------------------------- Encounter Discharge Information Details Patient Name: [name] Date of Service: 07/09/2016 1:30 PM Medical Record Number: [id number] Patient Account Number: [id number] Date of Birth/Sex: 1961-09-20 (55 y.o. Male) Treating RN: Phillis Haggis Primary Care Physician: Corky Downs Other Clinician: Referring Physician:  Corky Downs Treating Physician/Extender: Rudene Re in Treatment: 11 Encounter Discharge Information Items Discharge Pain Level: 0 Discharge Condition: Stable Ambulatory Status: Ambulatory Discharge Destination: Home Transportation: Private Auto Accompanied By: self Schedule Follow-up Appointment: Yes Medication Reconciliation completed and provided to Patient/Care Yes Muriel Wilber: Provided on Clinical Summary of Care: 07/09/2016 Form Type Recipient Paper Patient MD Electronic Signature(s) Signed: 07/09/2016 2:22:12 PM By: Gwenlyn Perking Entered By: Gwenlyn Perking on 07/09/2016 14:22:12 [name], [name] ([id number]) -------------------------------------------------------------------------------- Lower Extremity Assessment Details Patient Name: [name] Date of Service: 07/09/2016 1:30 PM Medical Record Number: [id number] Patient Account Number: [id number] Date of Birth/Sex: 13-Nov-1961 (55 y.o. Male) Treating RN: Phillis Haggis Primary Care Physician: Corky Downs Other Clinician: Referring Physician: Corky Downs Treating Physician/Extender: Rudene Re in Treatment: 11 Vascular Assessment Pulses: Posterior Tibial Dorsalis Pedis Palpable: [Left:Yes] [Right:Yes] Extremity colors, hair growth, and conditions: Extremity Color: [Left:Hyperpigmented] [Right:Hyperpigmented] Temperature of Extremity: [Left:Warm] [Right:Warm] Capillary Refill: [Left:< 3 seconds] [Right:< 3 seconds] Electronic Signature(s) Signed: 07/09/2016 4:45:48 PM By: Alejandro Mulling Entered By: Alejandro Mulling on 07/09/2016 13:55:50 [name], [name] ([id number]) -------------------------------------------------------------------------------- Multi Wound Chart Details Patient Name: [name] Date of Service: 07/09/2016 1:30 PM Medical Record Number: [id number] Patient Account Number: [id number] Date of Birth/Sex: 1961-02-11 (55 y.o. Male) Treating RN: Ashok Cordia,  Debi Primary Care Physician: Corky Downs Other Clinician: Referring Physician: Corky Downs Treating Physician/Extender: Rudene Re in Treatment: 11 Vital Signs Height(in): 76 Pulse(bpm): 81 Weight(lbs): 320 Blood Pressure 155/107 (mmHg): Body Mass Index(BMI): 39 Temperature(F): 97.9 Respiratory Rate 20 (breaths/min): Photos: [2:No Photos] [5:No Photos] [N/A:N/A] Wound Location: [2:Left Lower Leg - Circumfernential] [5:Left Lower Leg - Anterior N/A] Wounding Event: [2:Gradually Appeared] [5:Gradually Appeared] [N/A:N/A] Primary Etiology: [2:Venous Leg Ulcer] [5:Lymphedema] [N/A:N/A] Comorbid History: [2:Arrhythmia, Hypertension] [5:Arrhythmia, Hypertension] [N/A:N/A] Date Acquired: [2:12/14/2015] [5:07/04/2016] [N/A:N/A] Weeks of Treatment: [2:11] [5:0] [N/A:N/A] Wound Status: [2:Open] [5:Open] [N/A:N/A] Measurements L  x W x D 0.3x0.3x0.2 [5:0.5x0.5x0.1] [N/A:N/A] (cm) Area (cm) : [2:0.071] [5:0.196] [N/A:N/A] Volume (cm) : [2:0.014] [5:0.02] [N/A:N/A] % Reduction in Area: [2:100.00%] [5:N/A] [N/A:N/A] % Reduction in Volume: 100.00% [5:N/A] [N/A:N/A] Classification: [2:Partial Thickness] [5:Partial Thickness] [N/A:N/A] Exudate Amount: [2:Large] [5:Large] [N/A:N/A] Exudate Type: [2:Serosanguineous] [5:Serous] [N/A:N/A] Exudate Color: [2:red, brown] [5:amber] [N/A:N/A] Wound Margin: [2:Distinct, outline attached] [5:Flat and Intact] [N/A:N/A] Granulation Amount: [2:Large (67-100%)] [5:Large (67-100%)] [N/A:N/A] Granulation Quality: [2:Pink] [5:Red] [N/A:N/A] Necrotic Amount: [2:Small (1-33%)] [5:None Present (0%)] [N/A:N/A] Epithelialization: [2:Large (67-100%)] [5:None] [N/A:N/A] Periwound Skin Texture: Edema: Yes [5:No Abnormalities Noted] [N/A:N/A] Periwound Skin [2:No Abnormalities Noted] [5:Moist: Yes] [N/A:N/A] Moisture: Periwound Skin Color: No Abnormalities Noted [5:No Abnormalities Noted] [N/A:N/A] Temperature: [2:No Abnormality] [5:No Abnormality]  [N/A:N/A] Tenderness on Yes Yes N/A Palpation: Wound Preparation: Ulcer Cleansing: Other: Ulcer Cleansing: N/A soap and water Rinsed/Irrigated with Saline Topical Anesthetic Applied: Other: lidocaine Topical Anesthetic 4% Applied: Other: lidocaine 4% Treatment Notes Electronic Signature(s) Signed: 07/09/2016 4:45:48 PM By: Alejandro Mulling Entered By: Alejandro Mulling on 07/09/2016 14:03:21 [name], [name] ([id number]) -------------------------------------------------------------------------------- Multi-Disciplinary Care Plan Details Patient Name: [name] Date of Service: 07/09/2016 1:30 PM Medical Record Number: [id number] Patient Account Number: [id number] Date of Birth/Sex: [birth year] (55 y.o. Male) Treating RN: Phillis Haggis Primary Care Physician: Corky Downs Other Clinician: Referring Physician: Corky Downs Treating Physician/Extender: Rudene Re in Treatment: 11 Active Inactive Orientation to the Wound Care Program Nursing Diagnoses: Knowledge deficit related to the wound healing center program Goals: Patient/caregiver will verbalize understanding of the Wound Healing Center Program Date Initiated: 04/23/2016 Goal Status: Active Interventions: Provide education on orientation to the wound center Notes: Pain, Acute or Chronic Nursing Diagnoses: Pain, acute or chronic: actual or potential Potential alteration in comfort, pain Goals: Patient will verbalize adequate pain control and receive pain control interventions during procedures as needed Date Initiated: 04/23/2016 Goal Status: Active Patient/caregiver will verbalize adequate pain control between visits Date Initiated: 04/23/2016 Goal Status: Active Interventions: Assess comfort goal upon admission Complete pain assessment as per visit requirements Notes: Wound/Skin Impairment Hantz, Pau Shela [place] ([id number]) Nursing Diagnoses: Impaired tissue integrity Knowledge deficit  related to smoking impact on wound healing Knowledge deficit related to ulceration/compromised skin integrity Goals: Ulcer/skin breakdown will have a volume reduction of 30% by week 4 Date Initiated: 04/23/2016 Goal Status: Active Ulcer/skin breakdown will have a volume reduction of 50% by week 8 Date Initiated: 04/23/2016 Goal Status: Active Ulcer/skin breakdown will have a volume reduction of 80% by week 12 Date Initiated: 04/23/2016 Goal Status: Active Interventions: Assess ulceration(s) every visit Notes: Electronic Signature(s) Signed: 07/09/2016 4:45:48 PM By: Alejandro Mulling Entered By: Alejandro Mulling on 07/09/2016 14:03:16 [name], [name] ([id number]) -------------------------------------------------------------------------------- Pain Assessment Details Patient Name: [name] Date of Service: 07/09/2016 1:30 PM Medical Record Number: [id number] Patient Account Number: [id number] Date of Birth/Sex: [birth year] (55 y.o. Male) Treating RN: Phillis Haggis Primary Care Physician: Corky Downs Other Clinician: Referring Physician: Corky Downs Treating Physician/Extender: Rudene Re in Treatment: 11 Active Problems Location of Pain Severity and Description of Pain Patient Has Paino No Site Locations With Dressing Change: No Pain Management and Medication Current Pain Management: Electronic Signature(s) Signed: 07/09/2016 4:45:48 PM By: Alejandro Mulling Entered By: Alejandro Mulling on 07/09/2016 13:49:55 [name], [name] ([id number]) -------------------------------------------------------------------------------- Patient/Caregiver Education Details Patient Name: [name] Date of Service: 07/09/2016 1:30 PM Medical Record Number: [id number] Patient Account Number: [id number] Date of Birth/Gender: 1961-07-24 (55 y.o. Male) Treating RN: Phillis Haggis Primary Care Physician: Corky Downs Other Clinician: Referring Physician: Juel Burrow,  [name] Treating Physician/Extender: Rudene Re in Treatment: 11 Education Assessment Education Provided To: Patient Education Topics Provided Wound/Skin Impairment: Handouts: Other: change dressing as ordered Methods: Demonstration, Explain/Verbal Responses: State content correctly Electronic Signature(s) Signed: 07/09/2016 4:45:48 PM By: Alejandro Mulling Entered By: Alejandro Mulling on 07/09/2016 14:02:59 [name], [name] ([id number]) -------------------------------------------------------------------------------- Wound Assessment Details Patient Name: [name] Date of Service: 07/09/2016 1:30 PM Medical Record Number: [id number] Patient Account Number: [id number] Date of Birth/Sex: 1961-10-17 (55 y.o. Male) Treating RN: Phillis Haggis Primary Care Physician: Corky Downs Other Clinician: Referring Physician: Corky Downs Treating Physician/Extender: Rudene Re in Treatment: 11 Wound Status Wound Number: 2 Primary Etiology: Venous Leg Ulcer Wound Location: Left Lower Leg - Wound Status: Open Circumfernential Comorbid History: Arrhythmia, Hypertension Wounding Event: Gradually Appeared Date Acquired: 12/14/2015 Weeks Of Treatment: 11 Clustered Wound: No Photos Photo Uploaded By: Alejandro Mulling on 07/09/2016 15:34:03 Wound Measurements Length: (cm) 0.3 Width: (cm) 0.3 Depth: (cm) 0.2 Area: (cm) 0.071 Volume: (cm) 0.014 % Reduction in Area: 100% % Reduction in Volume: 100% Epithelialization: Large (67-100%) Tunneling: No Undermining: No Wound Description Classification: Partial Thickness Wound Margin: Distinct, outline attached Exudate Amount: Large Exudate Type: Serosanguineous Exudate Color: red, brown Foul Odor After Cleansing: No Wound Bed Granulation Amount: Large (67-100%) Granulation Quality: Pink Necrotic Amount: Small (1-33%) Placke, Marquise J. ([id number]) Necrotic Quality: Adherent Slough Periwound Skin  Texture Texture Color No Abnormalities Noted: No No Abnormalities Noted: No Localized Edema: Yes Temperature / Pain Moisture Temperature: No Abnormality No Abnormalities Noted: No Tenderness on Palpation: Yes Wound Preparation Ulcer Cleansing: Other: soap and water, Topical Anesthetic Applied: Other: lidocaine 4%, Treatment Notes Wound #2 (Left, Circumferential Lower Leg) 1. Cleansed with: Clean wound with Normal Saline 2. Anesthetic Topical Lidocaine 4% cream to wound bed prior to debridement 3. Peri-wound Care: Skin Prep 4. Dressing Applied: Aquacel Ag 5. Secondary Dressing Applied Bordered Foam Dressing Electronic Signature(s) Signed: 07/09/2016 4:45:48 PM By: Alejandro Mulling Entered By: Alejandro Mulling on 07/09/2016 13:56:15 [name], [name] ([id number]) -------------------------------------------------------------------------------- Wound Assessment Details Patient Name: [name] Date of Service: 07/09/2016 1:30 PM Medical Record Number: [id number] Patient Account Number: [id number] Date of Birth/Sex: January 09, 1961 (55 y.o. Male) Treating RN: Phillis Haggis Primary Care Physician: Corky Downs Other Clinician: Referring Physician: Corky Downs Treating Physician/Extender: Rudene Re in Treatment: 11 Wound Status Wound Number: 5 Primary Etiology: Lymphedema Wound Location: Left Lower Leg - Anterior Wound Status: Open Wounding Event: Gradually Appeared Comorbid History: Arrhythmia, Hypertension Date Acquired: 07/04/2016 Weeks Of Treatment: 0 Clustered Wound: No Photos Photo Uploaded By: Alejandro Mulling on 07/09/2016 15:34:04 Wound Measurements Length: (cm) 0.5 Width: (cm) 0.5 Depth: (cm) 0.1 Area: (cm) 0.196 Volume: (cm) 0.02 % Reduction in Area: % Reduction in Volume: Epithelialization: None Tunneling: No Undermining: No Wound Description Classification: Partial Thickness Foul Odor Afte Wound Margin: Flat and  Intact Exudate Amount: Large Exudate Type: Serous Exudate Color: amber r Cleansing: No Wound Bed Granulation Amount: Large (67-100%) Exposed Structure Granulation Quality: Red Fascia Exposed: No Necrotic Amount: None Present (0%) Fat Layer Exposed: No Tendon Exposed: No [name], [name] J. ([id number]) Muscle Exposed: No Joint Exposed: No Bone Exposed: No Limited to Skin Breakdown Periwound Skin Texture Texture Color No Abnormalities Noted: No No Abnormalities Noted: No Moisture Temperature / Pain No Abnormalities Noted: No Temperature: No Abnormality Moist: Yes Tenderness on Palpation: Yes Wound Preparation Ulcer Cleansing: Rinsed/Irrigated with Saline Topical Anesthetic Applied: Other: lidocaine 4%, Treatment Notes Wound #5 (Left, Anterior Lower Leg) 1. Cleansed with: Clean wound with Normal  Saline 2. Anesthetic Topical Lidocaine 4% cream to wound bed prior to debridement 3. Peri-wound Care: Skin Prep 4. Dressing Applied: Aquacel Ag 5. Secondary Dressing Applied Bordered Foam Dressing Electronic Signature(s) Signed: 07/09/2016 4:45:48 PM By: Alejandro Mulling Entered By: Alejandro Mulling on 07/09/2016 14:00:45 Prew, [name] ([id number]) -------------------------------------------------------------------------------- Vitals Details Patient Name: [name] Date of Service: 07/09/2016 1:30 PM Medical Record Number: [id number] Patient Account Number: [id number] Date of Birth/Sex: [birth year] (55 y.o. Male) Treating RN: Phillis Haggis Primary Care Physician: Corky Downs Other Clinician: Referring Physician: Corky Downs Treating Physician/Extender: Rudene Re in Treatment: 11 Vital Signs Time Taken: 13:49 Temperature (F): 97.9 Height (in): 76 Pulse (bpm): 81 Weight (lbs): 320 Respiratory Rate (breaths/min): 20 Body Mass Index (BMI): 38.9 Blood Pressure (mmHg): 155/107 Reference Range: 80 - 120 mg / dl Electronic  Signature(s) Signed: 07/09/2016 4:45:48 PM By: Alejandro Mulling Entered By: Alejandro Mulling on 07/09/2016 13:51:31

## 2016-07-10 NOTE — Progress Notes (Signed)
BRYLEY, KOVACEVIC ([id number]) Visit Report for 07/09/2016 Chief Complaint Document Details Patient Name: [name], [name] Date of Service: 07/09/2016 1:30 PM Medical Record Number: [id number] Patient Account Number: [id number] Date of Birth/Sex: 09-24-1961 (55 y.o. Male) Treating RN: Riki Sheer Primary Care Physician: Corky Downs Other Clinician: Referring Physician: Corky Downs Treating Physician/Extender: Rudene Re in Treatment: 11 Information Obtained from: Patient Chief Complaint Patient presents to the wound care center for a consult due non healing wound to both lower extremities and accompanied by swelling and this has been worse for the last 5 months. He says the swelling of both lower extremity has been there for at least 6-7 years. Electronic Signature(s) Signed: 07/09/2016 2:10:56 PM By: Evlyn Kanner MD, FACS Entered By: Evlyn Kanner on 07/09/2016 14:10:55 [name], [name] ([id number]) -------------------------------------------------------------------------------- HPI Details Patient Name: [name] Date of Service: 07/09/2016 1:30 PM Medical Record Number: [id number] Patient Account Number: [id number] Date of Birth/Sex: 1961-09-20 (55 y.o. Male) Treating RN: Riki Sheer Primary Care Physician: Corky Downs Other Clinician: Referring Physician: Corky Downs Treating Physician/Extender: Rudene Re in Treatment: 11 History of Present Illness Location: ulceration and weeping of both lower extremities left more than the right Quality: Patient reports experiencing heaviness to affected area(s). Severity: Patient states wound are getting worse. Duration: Patient has had the wound for > 5 months prior to seeking treatment at the wound center Timing: Pain in wound is Intermittent (comes and goes Context: The wound would happen gradually Modifying Factors: Other treatment(s) tried include:has been having weekly wraps applied to  both lower extremities at Dr. Zannie Kehr office Associated Signs and Symptoms: Patient reports having increase swelling. HPI Description: 55 year old gentleman who has been treated in the past for venous ulcers of the lower extremity is also known to be a smoker for the last 20 years and smokes about a pack of cigarettes a day. He has been seen by Dr. Evette Cristal who has been treating left lower extremity venous ulcer with an Unna boot and have consulted vascular surgery to be seen by Dr. Gilda Crease. the patient was also recently admitted to the hospital on 04/17/2016 and discharged on 04/19/2016 with bleeding, hyponatremia, cellulitis of the right leg, laceration of the right foot. 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 He was admitted to hospital with significant bleeding from the ulcer and initially started on IV antibiotics for underlying cellulitis which was suspected. Patient was seen by Dr. Evie Lacks of surgery and Dr Alberteen Spindle of podiatry. Dr. Graciela Husbands had done excisional debridement of some of the superficial skin slough as well as the ulcerative area on the posterior aspect of the left ankle region. Unna's boots was applied. He was asked to follow-up at the wound center. He is on Xarelto for his chronic atrial fibrillation. 04/30/2016 -- the patient was seen by the PA at the [facility] vascular [facility] who did a consultation but did not have any investigations ordered or done. The patient continues to have a lot of oozing from his wounds. 06/04/2016 -- he has seen his PCP who put him on Lasix 40 mg daily along with potassium. Electronic Signature(s) Signed: 07/09/2016 2:11:00 PM By: Evlyn Kanner MD, FACS Entered By: Evlyn Kanner on 07/09/2016 14:11:00 [name], [name] ([id number]) -------------------------------------------------------------------------------- Physical Exam Details Patient Name: [name] Date of Service: 07/09/2016 1:30 PM Medical Record Number: [id number] Patient Account Number: [id number] Date of Birth/Sex: 07/21/1961 (55 y.o. Male) Treating RN: Riki Sheer Primary  Care Physician: Corky Downs Other Clinician: Referring Physician: Corky Downs Treating Physician/Extender: Rudene Re in Treatment: 11 Constitutional . Pulse regular. Respirations normal and unlabored. Afebrile. . Eyes Nonicteric. Reactive to light. Ears, Nose, Mouth, and Throat Lips, teeth, and gums WNL.Marland Kitchen Moist mucosa without lesions. Neck supple and nontender. No palpable supraclavicular or cervical adenopathy. Normal sized without goiter. Respiratory WNL. No retractions.. Cardiovascular Pedal Pulses WNL. No clubbing, cyanosis or edema. Lymphatic No adneopathy. No adenopathy. No adenopathy. Musculoskeletal Adexa without tenderness or enlargement.. Digits and nails w/o clubbing, cyanosis, infection, petechiae, ischemia, or inflammatory conditions.. Integumentary (Hair, Skin) No suspicious lesions. No crepitus or fluctuance. No peri-wound warmth or erythema. No masses.Marland Kitchen Psychiatric Judgement and insight Intact.. No evidence of depression, anxiety, or agitation.. Notes The wounds on the right and left lower extremity look fairly clean and there is minimal amount of drainage. The lymphedema is much better than before. No sharp debridement was required today. Electronic Signature(s) Signed: 07/09/2016 2:11:53 PM By: Evlyn Kanner MD, FACS Entered By: Evlyn Kanner on 07/09/2016 14:11:53 [name], [name] ([id number]) -------------------------------------------------------------------------------- Physician Orders Details Patient Name: [name] Date of Service: 07/09/2016 1:30 PM Medical Record Number: [id number] Patient Account Number: [id number] Date of Birth/Sex: 02-27-1961 (55 y.o. Male) Treating RN: Phillis Haggis Primary Care Physician: Corky Downs Other  Clinician: Referring Physician: Corky Downs Treating Physician/Extender: Rudene Re in Treatment: 11 Verbal / Phone Orders: Yes ClinicianAshok Cordia, Debi Read Back and Verified: Yes Diagnosis Coding Wound Cleansing Wound #2 Left,Circumferential Lower Leg o Cleanse wound with mild soap and water o May Shower, gently pat wound dry prior to applying new dressing. Wound #5 Left,Anterior Lower Leg o Cleanse wound with mild soap and water o May Shower, gently pat wound dry prior to applying new dressing. Anesthetic Wound #2 Left,Circumferential Lower Leg o Topical Lidocaine 4% cream applied to wound bed prior to debridement - for clinic use Wound #5 Left,Anterior Lower Leg o Topical Lidocaine 4% cream applied to wound bed prior to debridement - for clinic use Skin Barriers/Peri-Wound Care Wound #2 Left,Circumferential Lower Leg o Moisturizing lotion Wound #5 Left,Anterior Lower Leg o Moisturizing lotion Primary Wound Dressing Wound #2 Left,Circumferential Lower Leg o Aquacel Ag Wound #5 Left,Anterior Lower Leg o Aquacel Ag Secondary Dressing Wound #2 Left,Circumferential Lower Leg o Boardered Foam Dressing Wound #5 Left,Anterior Lower Leg o Boardered Foam Dressing [name], [name] J. ([id number]) Dressing Change Frequency Wound #2 Left,Circumferential Lower Leg o Change dressing every other day. Wound #5 Left,Anterior Lower Leg o Change dressing every other day. Follow-up Appointments Wound #2 Left,Circumferential Lower Leg o Return Appointment in 1 week. Wound #5 Left,Anterior Lower Leg o Return Appointment in 1 week. Edema Control Wound #2 Left,Circumferential Lower Leg o Elevate legs to the level of the heart and pump ankles as often as possible o Support Garment 20-30 mm/Hg pressure to: - Juxtalites Wound #5 Left,Anterior Lower Leg o Elevate legs to the level of the heart and pump ankles as often as possible o  Support Garment 20-30 mm/Hg pressure to: - Juxtalites Additional Orders / Instructions Wound #2 Left,Circumferential Lower Leg o Stop Smoking o Increase protein intake. Wound #5 Left,Anterior Lower Leg o Stop Smoking o Increase protein intake. Electronic Signature(s) Signed: 07/09/2016 3:01:37 PM By: Evlyn Kanner MD, FACS Signed: 07/09/2016 4:45:48 PM By: Alejandro Mulling Entered By: Alejandro Mulling on 07/09/2016 14:08:04 [name], [name] ([id number]) -------------------------------------------------------------------------------- Problem List Details Patient Name: [name] Date of Service: 07/09/2016 1:30 PM Medical Record Number: [id number] Patient Account Number:  [id number] Date of Birth/Sex: [birth year] (55 y.o. 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.032 Postthrombotic syndrome with ulcer and inflammation of 04/23/2016 Yes left lower extremity F17.218 Nicotine dependence, cigarettes, with other nicotine- 04/23/2016 Yes induced disorders Inactive Problems Resolved Problems Electronic Signature(s) Signed: 07/09/2016 2:10:45 PM By: Evlyn Kanner MD, FACS Entered By: Evlyn Kanner on 07/09/2016 14:10:45 [name], [name] ([id number]) -------------------------------------------------------------------------------- Progress Note Details Patient Name: [name] Date of Service: 07/09/2016 1:30 PM Medical Record Number: [id number] Patient Account Number: [id number] Date of Birth/Sex: [birth year] (55 y.o. Male) Treating RN: Riki Sheer Primary Care Physician: Corky Downs Other Clinician: Referring Physician: Corky Downs Treating Physician/Extender: Rudene Re in Treatment: 11 Subjective Chief Complaint Information obtained from Patient Patient presents to the wound care center for a consult due non healing wound to both lower extremities and accompanied by swelling and this has been worse for the last 5 months. He says the swelling of both lower extremity has been there for at least 6-7 years. History of Present Illness (HPI) The following HPI elements were documented for the patient's wound: Location: ulceration and weeping of both lower extremities left more than the right Quality: Patient reports experiencing heaviness to affected area(s). Severity: Patient states wound are getting worse. Duration: Patient has had the wound for > 5 months prior to seeking treatment at the wound center Timing: Pain in wound is Intermittent (comes and goes Context: The wound would happen gradually Modifying Factors: Other treatment(s) tried include:has been having weekly wraps applied to both lower extremities at Dr. Zannie Kehr office Associated Signs and Symptoms: Patient reports having increase swelling. 55 year old gentleman who has been treated in the past for venous ulcers of the lower extremity is also known to be a smoker for the last 20 years and smokes about a pack of cigarettes a day. He has been seen by Dr. Evette Cristal who has been treating left lower extremity venous ulcer with an Unna boot and have consulted vascular surgery to be seen by Dr. Gilda Crease. the patient was also recently admitted to the hospital on 04/17/2016 and discharged on 04/19/2016 with bleeding, hyponatremia, cellulitis of the right leg, laceration of the right foot. 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 He was admitted to hospital with significant bleeding from the ulcer and initially  started on IV antibiotics for underlying cellulitis which was suspected. Patient was seen by Dr. Evie Lacks of surgery and Dr Alberteen Spindle of podiatry. Dr. Graciela Husbands had done excisional debridement of some of the superficial skin slough as well as the ulcerative area on the posterior aspect of the left ankle region. Unna's boots was applied. He was asked to follow-up at the wound center. He is on Xarelto for his chronic atrial fibrillation. 04/30/2016 -- the patient was seen by the PA at the vein and vascular practice who did a consultation but did not have any investigations ordered or done. The patient continues to have a lot of oozing from his wounds. 06/04/2016 -- he has seen his PCP who put him on Lasix 40 mg daily along with potassium. [name], [name] ([id number]) Objective Constitutional Pulse regular. Respirations normal and unlabored. Afebrile. Vitals Time Taken: 1:49 PM, Height:  76 in, Weight: 320 lbs, BMI: 38.9, Temperature: 97.9 F, Pulse: 81 bpm, Respiratory Rate: 20 breaths/min, Blood Pressure: 155/107 mmHg. Eyes Nonicteric. Reactive to light. Ears, Nose, Mouth, and Throat Lips, teeth, and gums WNL.Marland Kitchen Moist mucosa without lesions. Neck supple and nontender. No palpable supraclavicular or cervical adenopathy. Normal sized without goiter. Respiratory WNL. No retractions.. Cardiovascular Pedal Pulses WNL. No clubbing, cyanosis or edema. Lymphatic No adneopathy. No adenopathy. No adenopathy. Musculoskeletal Adexa without tenderness or enlargement.. Digits and nails w/o clubbing, cyanosis, infection, petechiae, ischemia, or inflammatory conditions.Marland Kitchen Psychiatric Judgement and insight Intact.. No evidence of depression, anxiety, or agitation.. General Notes: The wounds on the right and left lower extremity look fairly clean and there is minimal amount of drainage. The lymphedema is much better than before. No sharp debridement was required today. Integumentary (Hair, Skin) No suspicious  lesions. No crepitus or fluctuance. No peri-wound warmth or erythema. No masses.. Wound #2 status is Open. Original cause of wound was Gradually Appeared. The wound is located on the Left,Circumferential Lower Leg. The wound measures 0.3cm length x 0.3cm width x 0.2cm depth; Bradwell, [name] J. ([id number]) 0.071cm^2 area and 0.014cm^3 volume. There is no tunneling or undermining noted. There is a large amount of serosanguineous drainage noted. The wound margin is distinct with the outline attached to the wound base. There is large (67-100%) pink granulation within the wound bed. There is a small (1-33%) amount of necrotic tissue within the wound bed including Adherent Slough. The periwound skin appearance exhibited: Localized Edema. Periwound temperature was noted as No Abnormality. The periwound has tenderness on palpation. Wound #5 status is Open. Original cause of wound was Gradually Appeared. The wound is located on the Left,Anterior Lower Leg. The wound measures 0.5cm length x 0.5cm width x 0.1cm depth; 0.196cm^2 area and 0.02cm^3 volume. The wound is limited to skin breakdown. There is no tunneling or undermining noted. There is a large amount of serous drainage noted. The wound margin is flat and intact. There is large (67- 100%) red granulation within the wound bed. There is no necrotic tissue within the wound bed. The periwound skin appearance exhibited: Moist. Periwound temperature was noted as No Abnormality. The periwound has tenderness on palpation. 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 Wound #5 Left,Anterior Lower Leg: Cleanse wound with mild soap and water May Shower, gently pat wound dry prior to applying new dressing. Anesthetic: Wound #2 Left,Circumferential Lower Leg: Topical Lidocaine 4% cream applied to wound bed prior to debridement - for clinic use Wound #5 Left,Anterior Lower Leg: Topical Lidocaine 4% cream applied to wound bed prior to debridement - for clinic use Skin Barriers/Peri-Wound Care: Wound #2 Left,Circumferential Lower Leg: Moisturizing lotion [name], [name] J. ([id number]) Wound #5 Left,Anterior Lower Leg: Moisturizing lotion Primary Wound Dressing: Wound #2 Left,Circumferential Lower Leg: Aquacel Ag Wound #5 Left,Anterior Lower Leg: Aquacel Ag Secondary Dressing: Wound #2 Left,Circumferential Lower Leg: Boardered Foam Dressing Wound #5 Left,Anterior Lower Leg: Boardered Foam Dressing Dressing Change Frequency: Wound #2 Left,Circumferential Lower Leg: Change dressing every other day. Wound #5 Left,Anterior Lower Leg: Change dressing every other day. Follow-up Appointments: Wound #2 Left,Circumferential Lower Leg: Return Appointment  in 1 week. Wound #5 Left,Anterior Lower Leg: Return Appointment in 1 week. Edema Control: Wound #2 Left,Circumferential Lower Leg: Elevate legs to the level of the heart and pump ankles as often as possible Support Garment 20-30 mm/Hg pressure to: - Juxtalites Wound #5 Left,Anterior Lower Leg: Elevate legs to the level of the heart and pump ankles as often as possible Support Garment 20-30 mm/Hg pressure to: - Juxtalites Additional Orders / Instructions: Wound #2 Left,Circumferential Lower Leg: Stop Smoking Increase protein intake. Wound #5 Left,Anterior Lower Leg: Stop Smoking Increase protein intake. your small pieces of silver alginate over the wounds and then continue with his juxta light stockings. Skin and exercise have  been specified and he is going to be working hard to completely give up smoking Electronic Signature(s) Signed: 07/09/2016 2:12:33 PM By: Evlyn Kanner MD, FACS Entered By: Evlyn Kanner on 07/09/2016 14:12:33 [name], [name] ([id number]) [name], [name] ([id number]) -------------------------------------------------------------------------------- SuperBill Details Patient Name: [name] Date of Service: 07/09/2016 Medical Record Number: [id number] Patient Account Number: [id number] Date of Birth/Sex: Jan 24, 1961 (55 y.o. 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 I87.032 Postthrombotic syndrome with ulcer and inflammation of left lower extremity F17.218 Nicotine dependence, cigarettes, with other nicotine-induced disorders Facility Procedures CPT4 Code: 08657846 Description: 99213 - WOUND CARE VISIT-LEV 3 EST PT Modifier: Quantity: 1 Physician Procedures CPT4: Description Modifier Quantity Code [id number] 99213 - WC PHYS LEVEL 3 - EST PT 1 ICD-10 Description Diagnosis I89.0 Lymphedema, not elsewhere classified 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

## 2016-07-16 ENCOUNTER — Encounter: Payer: BC Managed Care – PPO | Attending: Surgery | Admitting: Surgery

## 2016-07-16 DIAGNOSIS — K219 Gastro-esophageal reflux disease without esophagitis: Secondary | ICD-10-CM | POA: Diagnosis not present

## 2016-07-16 DIAGNOSIS — Z6838 Body mass index (BMI) 38.0-38.9, adult: Secondary | ICD-10-CM | POA: Diagnosis not present

## 2016-07-16 DIAGNOSIS — I87033 Postthrombotic syndrome with ulcer and inflammation of bilateral lower extremity: Secondary | ICD-10-CM | POA: Diagnosis not present

## 2016-07-16 DIAGNOSIS — I890 Lymphedema, not elsewhere classified: Secondary | ICD-10-CM | POA: Insufficient documentation

## 2016-07-16 DIAGNOSIS — I10 Essential (primary) hypertension: Secondary | ICD-10-CM | POA: Insufficient documentation

## 2016-07-16 DIAGNOSIS — L97821 Non-pressure chronic ulcer of other part of left lower leg limited to breakdown of skin: Secondary | ICD-10-CM | POA: Diagnosis not present

## 2016-07-16 DIAGNOSIS — I482 Chronic atrial fibrillation: Secondary | ICD-10-CM | POA: Insufficient documentation

## 2016-07-16 DIAGNOSIS — F17218 Nicotine dependence, cigarettes, with other nicotine-induced disorders: Secondary | ICD-10-CM | POA: Insufficient documentation

## 2016-07-16 DIAGNOSIS — L97811 Non-pressure chronic ulcer of other part of right lower leg limited to breakdown of skin: Secondary | ICD-10-CM | POA: Insufficient documentation

## 2016-07-16 DIAGNOSIS — Z7901 Long term (current) use of anticoagulants: Secondary | ICD-10-CM | POA: Diagnosis not present

## 2016-07-17 NOTE — Progress Notes (Signed)
AXTEN, PASCUCCI ([id number]) Visit Report for 07/16/2016 Arrival Information Details Patient Name: [name], [name] Date of Service: 07/16/2016 3:00 PM Medical Record Number: [id number] Patient Account Number: [id number] Date of Birth/Sex: 11-27-1961 (55 y.o. Male) Treating RN: Phillis Haggis Primary Care Physician: Corky Downs Other Clinician: Referring Physician: Corky Downs Treating Physician/Extender: Rudene Re in Treatment: 12 Visit Information History Since Last Visit All ordered tests and consults were completed: No Patient Arrived: Ambulatory Added or deleted any medications: No Arrival Time: 15:16 Any new allergies or adverse reactions: No Accompanied By: self Had a fall or experienced change in No Transfer Assistance: None activities of daily living that may affect Patient Identification Verified: Yes risk of falls: Secondary Verification Process Yes Signs or symptoms of abuse/neglect since last No Completed: visito Patient Requires Transmission- No Hospitalized since last visit: No Based Precautions: Pain Present Now: No Patient Has Alerts: Yes Patient Alerts: Patient on Blood Thinner [name] Electronic Signature(s) Signed: 07/16/2016 4:13:59 PM By: Alejandro Mulling Entered By: Alejandro Mulling on 07/16/2016 15:16:30 [name], [name] ([id number]) -------------------------------------------------------------------------------- Clinic Level of Care Assessment Details Patient Name: [name] Date of Service: 07/16/2016 3:00 PM Medical Record Number: [id number] Patient Account Number: [id number] Date of Birth/Sex: 04-Dec-1961 (55 y.o. Male) Treating RN: Ashok Cordia, Debi Primary Care Physician: Corky Downs Other Clinician: Referring Physician: Corky Downs Treating Physician/Extender: Rudene Re in Treatment: 12 Clinic Level of Care Assessment Items TOOL 4 Quantity Score X - Use when only an EandM is performed on FOLLOW-UP  visit 1 0 ASSESSMENTS - Nursing Assessment / Reassessment X - Reassessment of Co-morbidities (includes updates in patient status) 1 10 X - Reassessment of Adherence to Treatment Plan 1 5 ASSESSMENTS - Wound and Skin Assessment / Reassessment  - Simple Wound Assessment / Reassessment - one wound 0 X - Complex Wound Assessment / Reassessment - multiple wounds 2 5  - Dermatologic / Skin Assessment (not related to wound area) 0 ASSESSMENTS - Focused Assessment  - Circumferential Edema Measurements - multi extremities 0  - Nutritional Assessment / Counseling / Intervention 0  - Lower Extremity Assessment (monofilament, tuning fork, pulses) 0  - Peripheral Arterial Disease Assessment (using hand held doppler) 0 ASSESSMENTS - Ostomy and/or Continence Assessment and Care  - Incontinence Assessment and Management 0  - Ostomy Care Assessment and Management (repouching, etc.) 0 PROCESS - Coordination of Care X - Simple Patient / Family Education for ongoing care 1 15  - Complex (extensive) Patient / Family Education for ongoing care 0  - Staff obtains Chiropractor, Records, Test Results / Process Orders 0  - Staff telephones HHA, Nursing Homes / Clarify orders / etc 0  - Routine Transfer to another Facility (non-emergent condition) 0 [name], [name] ([id number])  - Routine Hospital Admission (non-emergent condition) 0  - New Admissions / Manufacturing engineer / Ordering NPWT, Apligraf, etc. 0  - Emergency Hospital Admission (emergent condition) 0 X - Simple Discharge Coordination 1 10  - Complex (extensive) Discharge Coordination 0 PROCESS - Special Needs  - Pediatric / Minor Patient Management 0  - Isolation Patient Management 0  - Hearing / Language / Visual special needs 0  - Assessment of Community assistance (transportation, D/C planning, etc.) 0  - Additional assistance / Altered mentation 0  - Support Surface(s) Assessment (bed, cushion,  seat, etc.) 0 INTERVENTIONS - Wound Cleansing / Measurement X - Simple Wound Cleansing - one wound 1 5  - Complex Wound Cleansing - multiple wounds 0 X - Wound Imaging (photographs -  any number of wounds) 1 5 []  - Wound Tracing (instead of photographs) 0 X - Simple Wound Measurement - one wound 1 5 []  - Complex Wound Measurement - multiple wounds 0 INTERVENTIONS - Wound Dressings X - Small Wound Dressing one or multiple wounds 1 10 []  - Medium Wound Dressing one or multiple wounds 0 []  - Large Wound Dressing one or multiple wounds 0 X - Application of Medications - topical 1 5 []  - Application of Medications - injection 0 INTERVENTIONS - Miscellaneous []  - External ear exam 0 [name], [name] ([id number]) []  - Specimen Collection (cultures, biopsies, blood, body fluids, etc.) 0 []  - Specimen(s) / Culture(s) sent or taken to Lab for analysis 0 []  - Patient Transfer (multiple staff / [name] Lift / Similar devices) 0 []  - Simple Staple / Suture removal (25 or less) 0 []  - Complex Staple / Suture removal (26 or more) 0 []  - Hypo / Hyperglycemic Management (close monitor of Blood Glucose) 0 []  - Ankle / Brachial Index (ABI) - do not check if billed separately 0 X - Vital Signs 1 5 Has the patient been seen at the hospital within the last three years: Yes Total Score: 85 Level Of Care: New/Established - Level 3 Electronic Signature(s) Signed: 07/16/2016 4:13:59 PM By: Alejandro Mulling Entered By: Alejandro Mulling on 07/16/2016 15:59:54 [name], [name] ([id number]) -------------------------------------------------------------------------------- Encounter Discharge Information Details Patient Name: [name] Date of Service: 07/16/2016 3:00 PM Medical Record Number: [id number] Patient Account Number: [id number] Date of Birth/Sex: 09/23/1961 (55 y.o. Male) Treating RN: Phillis Haggis Primary Care Physician: Corky Downs Other Clinician: Referring Physician: Corky Downs Treating Physician/Extender: Rudene Re in Treatment: 12 Encounter Discharge Information Items Discharge Pain Level: 0 Discharge Condition: Stable Ambulatory Status: Ambulatory Discharge Destination: Home Transportation: Private Auto Accompanied By: self Schedule Follow-up Appointment: Yes Medication Reconciliation completed and provided to Patient/Care Yes Muhammad Vacca: Provided on Clinical Summary of Care: 07/16/2016 Form Type Recipient Paper Patient MD Electronic Signature(s) Signed: 07/16/2016 3:34:14 PM By: Gwenlyn Perking Entered By: Gwenlyn Perking on 07/16/2016 15:34:14 Harju, [name] ([id number]) -------------------------------------------------------------------------------- Lower Extremity Assessment Details Patient Name: [name] Date of Service: 07/16/2016 3:00 PM Medical Record Number: [id number] Patient Account Number: [id number] Date of Birth/Sex: 1961-07-31 (55 y.o. Male) Treating RN: Phillis Haggis Primary Care Physician: Corky Downs Other Clinician: Referring Physician: Corky Downs Treating Physician/Extender: Rudene Re in Treatment: 12 Vascular Assessment Pulses: Posterior Tibial Dorsalis Pedis Palpable: [Left:Yes] [Right:Yes] Extremity colors, hair growth, and conditions: Extremity Color: [Left:Hyperpigmented] [Right:Hyperpigmented] Temperature of Extremity: [Left:Warm] [Right:Warm] Capillary Refill: [Left:< 3 seconds] Electronic Signature(s) Signed: 07/16/2016 4:13:59 PM By: Alejandro Mulling Entered By: Alejandro Mulling on 07/16/2016 15:17:13 Smoker, [name] ([id number]) -------------------------------------------------------------------------------- Multi Wound Chart Details Patient Name: [name] Date of Service: 07/16/2016 3:00 PM Medical Record Number: [id number] Patient Account Number: [id number] Date of Birth/Sex: December 19, 1960 (55 y.o. Male) Treating RN: Ashok Cordia, Debi Primary Care Physician:  Corky Downs Other Clinician: Referring Physician: Corky Downs Treating Physician/Extender: Rudene Re in Treatment: 12 Vital Signs Height(in): 76 Pulse(bpm): 66 Weight(lbs): 320 Blood Pressure 146/98 (mmHg): Body Mass Index(BMI): 39 Temperature(F): 98.4 Respiratory Rate 20 (breaths/min): Photos: [2:No Photos] [5:No Photos] [N/A:N/A] Wound Location: [2:Left Lower Leg - Circumfernential] [5:Left Lower Leg - Anterior N/A] Wounding Event: [2:Gradually Appeared] [5:Gradually Appeared] [N/A:N/A] Primary Etiology: [2:Venous Leg Ulcer] [5:Lymphedema] [N/A:N/A] Comorbid History: [2:Arrhythmia, Hypertension] [5:Arrhythmia, Hypertension] [N/A:N/A] Date Acquired: [2:12/14/2015] [5:07/04/2016] [N/A:N/A] Weeks of Treatment: [2:12] [5:1] [N/A:N/A] Wound Status: [2:Open] [5:Open] [N/A:N/A] Measurements L x W x  D 0x0x0 [5:0.2x0.2x0.1] [N/A:N/A] (cm) Area (cm) : [2:0] [5:0.031] [N/A:N/A] Volume (cm) : [2:0] [5:0.003] [N/A:N/A] % Reduction in Area: [2:100.00%] [5:84.20%] [N/A:N/A] % Reduction in Volume: 100.00% [5:85.00%] [N/A:N/A] Classification: [2:Partial Thickness] [5:Partial Thickness] [N/A:N/A] Exudate Amount: [2:None Present] [5:Large] [N/A:N/A] Exudate Type: [2:N/A] [5:Serous] [N/A:N/A] Exudate Color: [2:N/A] [5:amber] [N/A:N/A] Wound Margin: [2:Distinct, outline attached] [5:Flat and Intact] [N/A:N/A] Granulation Amount: [2:None Present (0%)] [5:None Present (0%)] [N/A:N/A] Necrotic Amount: [2:None Present (0%)] [5:Large (67-100%)] [N/A:N/A] Exposed Structures: [2:Fascia: No Fat: No Tendon: No Muscle: No Joint: No Bone: No Limited to Skin Breakdown] [5:Fascia: No Fat: No Tendon: No Muscle: No Joint: No Bone: No Limited to Skin Breakdown] [N/A:N/A] Epithelialization: Large (67-100%) Large (67-100%) N/A Periwound Skin Texture: Edema: No No Abnormalities Noted N/A Periwound Skin No Abnormalities Noted Moist: Yes N/A Moisture: Periwound Skin Color: No Abnormalities Noted  No Abnormalities Noted N/A Temperature: No Abnormality No Abnormality N/A Tenderness on Yes Yes N/A Palpation: Wound Preparation: Ulcer Cleansing: Ulcer Cleansing: N/A Rinsed/Irrigated with Rinsed/Irrigated with Saline Saline Topical Anesthetic Topical Anesthetic Applied: None Applied: Other: lidocaine 4% Treatment Notes Electronic Signature(s) Signed: 07/16/2016 4:13:59 PM By: Alejandro Mulling Entered By: Alejandro Mulling on 07/16/2016 15:24:54 Cashin, [name] ([id number]) -------------------------------------------------------------------------------- Multi-Disciplinary Care Plan Details Patient Name: [name] Date of Service: 07/16/2016 3:00 PM Medical Record Number: [id number] Patient Account Number: [id number] Date of Birth/Sex: 08/28/1961 (55 y.o. Male) Treating RN: Phillis Haggis Primary Care Physician: Corky Downs Other Clinician: Referring Physician: Corky Downs Treating Physician/Extender: Rudene Re in Treatment: 12 Active Inactive Orientation to the Wound Care Program Nursing Diagnoses: Knowledge deficit related to the wound healing center program Goals: Patient/caregiver will verbalize understanding of the Wound Healing Center Program Date Initiated: 04/23/2016 Goal Status: Active Interventions: Provide education on orientation to the wound center Notes: Pain, Acute or Chronic Nursing Diagnoses: Pain, acute or chronic: actual or potential Potential alteration in comfort, pain Goals: Patient will verbalize adequate pain control and receive pain control interventions during procedures as needed Date Initiated: 04/23/2016 Goal Status: Active Patient/caregiver will verbalize adequate pain control between visits Date Initiated: 04/23/2016 Goal Status: Active Interventions: Assess comfort goal upon admission Complete pain assessment as per visit requirements Notes: Wound/Skin Impairment Kuchera, Berwyn Shela Commons ([id number]) Nursing  Diagnoses: Impaired tissue integrity Knowledge deficit related to smoking impact on wound healing Knowledge deficit related to ulceration/compromised skin integrity Goals: Ulcer/skin breakdown will have a volume reduction of 30% by week 4 Date Initiated: 04/23/2016 Goal Status: Active Ulcer/skin breakdown will have a volume reduction of 50% by week 8 Date Initiated: 04/23/2016 Goal Status: Active Ulcer/skin breakdown will have a volume reduction of 80% by week 12 Date Initiated: 04/23/2016 Goal Status: Active Interventions: Assess ulceration(s) every visit Notes: Electronic Signature(s) Signed: 07/16/2016 4:13:59 PM By: Alejandro Mulling Entered By: Alejandro Mulling on 07/16/2016 15:24:48 Yeager, [name] ([id number]) -------------------------------------------------------------------------------- Pain Assessment Details Patient Name: [name] Date of Service: 07/16/2016 3:00 PM Medical Record Number: [id number] Patient Account Number: [id number] Date of Birth/Sex: January 13, 1961 (55 y.o. Male) Treating RN: Phillis Haggis Primary Care Physician: Corky Downs Other Clinician: Referring Physician: Corky Downs Treating Physician/Extender: Rudene Re in Treatment: 12 Active Problems Location of Pain Severity and Description of Pain Patient Has Paino No Site Locations With Dressing Change: No Pain Management and Medication Current Pain Management: Electronic Signature(s) Signed: 07/16/2016 4:13:59 PM By: Alejandro Mulling Entered By: Alejandro Mulling on 07/16/2016 15:16:35 Okelley, [name] ([id number]) -------------------------------------------------------------------------------- Patient/Caregiver Education Details Patient Name: [name] Date of Service: 07/16/2016 3:00 PM Medical Record  Number: [id number] Patient Account Number: [id number] Date of Birth/Gender: January 11, 1961 (55 y.o. Male) Treating RN: Ashok Cordia, Debi Primary Care Physician: Corky Downs Other Clinician: Referring Physician: Corky Downs Treating Physician/Extender: Rudene Re in Treatment: 12 Education Assessment Education Provided To: Patient Education Topics Provided Wound/Skin Impairment: Handouts: Other: change dressing as ordered Methods: Demonstration, Explain/Verbal Responses: State content correctly Electronic Signature(s) Signed: 07/16/2016 4:13:59 PM By: Alejandro Mulling Entered By: Alejandro Mulling on 07/16/2016 15:27:11 Hanratty, [name] ([id number]) -------------------------------------------------------------------------------- Wound Assessment Details Patient Name: [name] Date of Service: 07/16/2016 3:00 PM Medical Record Number: [id number] Patient Account Number: [id number] Date of Birth/Sex: [birth year] (55 y.o. Male) Treating RN: Phillis Haggis Primary Care Physician: Corky Downs Other Clinician: Referring Physician: Corky Downs Treating Physician/Extender: Rudene Re in Treatment: 12 Wound Status Wound Number: 2 Primary Etiology: Venous Leg Ulcer Wound Location: Left Lower Leg - Wound Status: Open Circumfernential Comorbid History: Arrhythmia, Hypertension Wounding Event: Gradually Appeared Date Acquired: 12/14/2015 Weeks Of Treatment: 12 Clustered Wound: No Photos Photo Uploaded By: Alejandro Mulling on 07/16/2016 16:05:16 Wound Measurements Length: (cm) Width: (cm) Depth: (cm) Area: (cm) Volume: (cm) 0 % Reduction in Area: 100% 0 % Reduction in Volume: 100% 0 Epithelialization: Large (67-100%) 0 Tunneling: No 0 Undermining: No Wound Description Classification: Partial Thickness Wound Margin: Distinct, outline attached Exudate Amount: None Present Foul Odor After Cleansing: No Wound Bed Granulation Amount: None Present (0%) Exposed Structure Necrotic Amount: None Present (0%) Fascia Exposed: No Fat Layer Exposed: No Tendon Exposed: No Muscle Exposed: No Mander, Tully J.  ([id number]) Joint Exposed: No Bone Exposed: No Limited to Skin Breakdown Periwound Skin Texture Texture Color No Abnormalities Noted: No No Abnormalities Noted: No Localized Edema: No Temperature / Pain Moisture Temperature: No Abnormality No Abnormalities Noted: No Tenderness on Palpation: Yes Wound Preparation Ulcer Cleansing: Rinsed/Irrigated with Saline Topical Anesthetic Applied: None Electronic Signature(s) Signed: 07/16/2016 4:13:59 PM By: Alejandro Mulling Entered By: Alejandro Mulling on 07/16/2016 15:22:58 Mccarey, [name] ([id number]) -------------------------------------------------------------------------------- Wound Assessment Details Patient Name: [name] Date of Service: 07/16/2016 3:00 PM Medical Record Number: [id number] Patient Account Number: [id number] Date of Birth/Sex: 1961/05/19 (55 y.o. Male) Treating RN: Ashok Cordia, Debi Primary Care Physician: Corky Downs Other Clinician: Referring Physician: Corky Downs Treating Physician/Extender: Rudene Re in Treatment: 12 Wound Status Wound Number: 5 Primary Etiology: Lymphedema Wound Location: Left Lower Leg - Anterior Wound Status: Open Wounding Event: Gradually Appeared Comorbid History: Arrhythmia, Hypertension Date Acquired: 07/04/2016 Weeks Of Treatment: 1 Clustered Wound: No Photos Photo Uploaded By: Alejandro Mulling on 07/16/2016 16:05:16 Wound Measurements Length: (cm) 0.2 Width: (cm) 0.2 Depth: (cm) 0.1 Area: (cm) 0.031 Volume: (cm) 0.003 % Reduction in Area: 84.2% % Reduction in Volume: 85% Epithelialization: Large (67-100%) Tunneling: No Undermining: No Wound Description Classification: Partial Thickness Foul Odor Afte Wound Margin: Flat and Intact Exudate Amount: Large Exudate Type: Serous Exudate Color: amber r Cleansing: No Wound Bed Granulation Amount: None Present (0%) Exposed Structure Necrotic Amount: Large (67-100%) Fascia Exposed: No Necrotic  Quality: Adherent Slough Fat Layer Exposed: No Tendon Exposed: No Flanigan, Avik J. ([id number]) Muscle Exposed: No Joint Exposed: No Bone Exposed: No Limited to Skin Breakdown Periwound Skin Texture Texture Color No Abnormalities Noted: No No Abnormalities Noted: No Moisture Temperature / Pain No Abnormalities Noted: No Temperature: No Abnormality Moist: Yes Tenderness on Palpation: Yes Wound Preparation Ulcer Cleansing: Rinsed/Irrigated with Saline Topical Anesthetic Applied: Other: lidocaine 4%, Treatment Notes Wound #5 (Left, Anterior Lower Leg) 1. Cleansed with: Clean wound with Normal Saline 2.  Anesthetic Topical Lidocaine 4% cream to wound bed prior to debridement 3. Peri-wound Care: Skin Prep 4. Dressing Applied: Aquacel Ag 5. Secondary Dressing Applied Bordered Foam Dressing Electronic Signature(s) Signed: 07/16/2016 4:13:59 PM By: Alejandro Mulling Entered By: Alejandro Mulling on 07/16/2016 15:24:39 Coddington, [name] ([id number]) -------------------------------------------------------------------------------- Vitals Details Patient Name: [name] Date of Service: 07/16/2016 3:00 PM Medical Record Number: [id number] Patient Account Number: [id number] Date of Birth/Sex: [birth year] (55 y.o. Male) Treating RN: Phillis Haggis Primary Care Physician: Corky Downs Other Clinician: Referring Physician: Corky Downs Treating Physician/Extender: Rudene Re in Treatment: 12 Vital Signs Time Taken: 15:16 Temperature (F): 98.4 Height (in): 76 Pulse (bpm): 66 Weight (lbs): 320 Respiratory Rate (breaths/min): 20 Body Mass Index (BMI): 38.9 Blood Pressure (mmHg): 146/98 Reference Range: 80 - 120 mg / dl Electronic Signature(s) Signed: 07/16/2016 4:13:59 PM By: Alejandro Mulling Entered By: Alejandro Mulling on 07/16/2016 15:16:56

## 2016-07-17 NOTE — Progress Notes (Addendum)
POSEIDON, PAM ([id number]) Visit Report for 07/16/2016 Chief Complaint Document Details Patient Name: [name], [name] Date of Service: 07/16/2016 3:00 PM Medical Record Number: [id number] Patient Account Number: [id number] Date of Birth/Sex: 20-Nov-1961 (55 y.o. Male) Treating RN: Phillis Haggis Primary Care Physician: Corky Downs Other Clinician: Referring Physician: Corky Downs Treating Physician/Extender: Rudene Re in Treatment: 12 Information Obtained from: Patient Chief Complaint Patient presents to the wound care center for a consult due non healing wound to both lower extremities and accompanied by swelling and this has been worse for the last 5 months. He says the swelling of both lower extremity has been there for at least 6-7 years. Electronic Signature(s) Signed: 07/16/2016 3:27:43 PM By: Evlyn Kanner MD, FACS Entered By: Evlyn Kanner on 07/16/2016 15:27:43 Schueller, [name] ([id number]) -------------------------------------------------------------------------------- HPI Details Patient Name: [name] Date of Service: 07/16/2016 3:00 PM Medical Record Number: [id number] Patient Account Number: [id number] Date of Birth/Sex: [birth year] (55 y.o. Male) Treating RN: Phillis Haggis Primary Care Physician: Corky Downs Other Clinician: Referring Physician: Corky Downs Treating Physician/Extender: Rudene Re in Treatment: 12 History of Present Illness Location: ulceration and weeping of both lower extremities left more than the right Quality: Patient reports experiencing heaviness to affected area(s). Severity: Patient states wound are getting worse. Duration: Patient has had the wound for > 5 months prior to seeking treatment at the wound center Timing: Pain in wound is Intermittent (comes and goes Context: The wound would happen gradually Modifying Factors: Other treatment(s) tried include:has been having weekly wraps applied to both  lower extremities at Dr. Zannie Kehr office Associated Signs and Symptoms: Patient reports having increase swelling. HPI Description: 55 year old gentleman who has been treated in the past for venous ulcers of the lower extremity is also known to be a smoker for the last 20 years and smokes about a pack of cigarettes a day. He has been seen by Dr. Evette Cristal who has been treating left lower extremity venous ulcer with an Unna boot and have consulted vascular surgery to be seen by Dr. Gilda Crease. the patient was also recently admitted to the hospital on 04/17/2016 and discharged on 04/19/2016 with bleeding, hyponatremia, cellulitis of the right leg, laceration of the right foot. 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 He was admitted to hospital with significant bleeding from the ulcer and initially started on IV antibiotics for underlying cellulitis which was suspected. Patient was seen by Dr. Evie Lacks of surgery and Dr Alberteen Spindle of podiatry. Dr. Graciela Husbands had done excisional debridement of some of the superficial skin slough as well as the ulcerative area on the posterior aspect of the left ankle region. Unna's boots was applied. He was asked to follow-up at the wound center. He is on Xarelto for his chronic atrial fibrillation. 04/30/2016 -- the patient was seen by the PA at the [facility] vascular practice who did a consultation but did not have any investigations ordered or done. The patient continues to have a lot of oozing from his wounds. 06/04/2016 -- he has seen his PCP who put him on Lasix 40 mg daily along with potassium. Electronic Signature(s) Signed: 07/16/2016 3:27:53 PM By: Evlyn Kanner MD, FACS Entered By: Evlyn Kanner on 07/16/2016 15:27:53 Ramthun, [name] ([id number]) -------------------------------------------------------------------------------- Physical Exam Details Patient Name: [name] Date of Service: 07/16/2016 3:00 PM Medical Record Number: [id number] Patient Account Number: [id number] Date of Birth/Sex: Mar 03, 1961 (55 y.o. Male) Treating RN: Ashok Cordia, Debi Primary  Care Physician: Corky Downs Other Clinician: Referring Physician: Corky Downs Treating Physician/Extender: Rudene Re in Treatment: 12 Constitutional . Pulse regular. Respirations normal and unlabored. Afebrile. . Eyes Nonicteric. Reactive to light. Ears, Nose, Mouth, and Throat Lips, teeth, and gums WNL.Marland Kitchen Moist mucosa without lesions. Neck supple and nontender. No palpable supraclavicular or cervical adenopathy. Normal sized without goiter. Respiratory WNL. No retractions.. Breath sounds WNL, No rubs, rales, rhonchi, or wheeze.. Cardiovascular Heart rhythm and rate regular, no murmur or gallop.. Pedal Pulses WNL. No clubbing, cyanosis or edema. Chest Breasts symmetical and no nipple discharge.. Breast tissue WNL, no masses, lumps, or tenderness.. Lymphatic No adneopathy. No adenopathy. No adenopathy. Musculoskeletal Adexa without tenderness or enlargement.. Digits and nails w/o clubbing, cyanosis, infection, petechiae, ischemia, or inflammatory conditions.. Integumentary (Hair, Skin) No suspicious lesions. No crepitus or fluctuance. No peri-wound warmth or erythema. No masses.Marland Kitchen Psychiatric Judgement and insight Intact.. No evidence of depression, anxiety, or agitation.. Notes and on his right leg is completely healed. The left leg there is one small open area on the anterior shin region which has minimal depth. The rest of the wounds have healed. Electronic Signature(s) Signed: 07/16/2016 3:28:23 PM By: Evlyn Kanner MD, FACS Entered By: Evlyn Kanner on 07/16/2016 15:28:23 [name], [name] ([id number]) -------------------------------------------------------------------------------- Physician Orders Details Patient Name: [name] Date of Service: 07/16/2016 3:00  PM Medical Record Number: [id number] Patient Account Number: [id number] Date of Birth/Sex: [birth year] (55 y.o. Male) Treating RN: Phillis Haggis Primary Care Physician: Corky Downs Other Clinician: Referring Physician: Corky Downs Treating Physician/Extender: Rudene Re in Treatment: 12 Verbal / Phone Orders: Yes ClinicianAshok Cordia, Debi Read Back and Verified: Yes Diagnosis Coding Wound Cleansing Wound #5 Left,Anterior Lower Leg o Cleanse wound with mild soap and water o May Shower, gently pat wound dry prior to applying new dressing. Anesthetic Wound #5 Left,Anterior Lower Leg o Topical Lidocaine 4% cream applied to wound bed prior to debridement - for clinic use Skin Barriers/Peri-Wound Care Wound #5 Left,Anterior Lower Leg o Moisturizing lotion Primary Wound Dressing Wound #5 Left,Anterior Lower Leg o Aquacel Ag Secondary Dressing Wound #5 Left,Anterior Lower Leg o Boardered Foam Dressing Dressing Change Frequency Wound #5 Left,Anterior Lower Leg o Change dressing every other day. Follow-up Appointments Wound #5 Left,Anterior Lower Leg o Return Appointment in 1 week. Edema Control Wound #5 Left,Anterior Lower Leg o Elevate legs to the level of the heart and pump ankles as often as possible o Support Garment 20-30 mm/Hg pressure to: - Juxtalites [name], [name] J. ([id number]) Additional Orders / Instructions Wound #5 Left,Anterior Lower Leg o Stop Smoking o Increase protein intake. Electronic Signature(s) Signed: 07/16/2016 4:13:59 PM By: Alejandro Mulling Signed: 07/16/2016 4:14:16 PM By: Evlyn Kanner MD, FACS Entered By: Alejandro Mulling on 07/16/2016 16:02:47 [name], [name] ([id number]) -------------------------------------------------------------------------------- Problem List Details Patient Name: [name] Date of Service: 07/16/2016 3:00 PM Medical Record Number: [id number] Patient Account Number:  [id number] Date of Birth/Sex: 21-Jan-1961 (55 y.o. 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 I87.032 Postthrombotic syndrome with ulcer and inflammation of 04/23/2016 Yes left lower extremity F17.218 Nicotine dependence, cigarettes, with other nicotine- 04/23/2016 Yes induced disorders Inactive Problems Resolved Problems Electronic Signature(s) Signed: 07/16/2016 3:27:32 PM By: Evlyn Kanner MD, FACS Entered By:  Liev Brockbank on 07/16/2016 15:27:32 [name], [name] ([id number]) -------------------------------------------------------------------------------- Progress Note Details Patient Name: [name], [name] Date of Service: 07/16/2016 3:00 PM Medical Record Number: [id number] Patient Account Number: [id number] Date of Birth/Sex: [birth year] (55 y.o. Male) Treating RN: Phillis Haggis Primary Care Physician: Corky Downs Other Clinician: Referring Physician: Corky Downs Treating Physician/Extender: Rudene Re in Treatment: 12 Subjective Chief Complaint Information obtained from Patient Patient presents to the wound care center for a consult due non healing wound to both lower extremities and accompanied by swelling and this has been worse for the last 5 months. He says the swelling of both lower extremity has been there for at least 6-7 years. History of Present Illness (HPI) The following HPI elements were documented for the patient's wound: Location: ulceration and weeping of both lower extremities left more than the right Quality: Patient reports experiencing heaviness to affected  area(s). Severity: Patient states wound are getting worse. Duration: Patient has had the wound for > 5 months prior to seeking treatment at the wound center Timing: Pain in wound is Intermittent (comes and goes Context: The wound would happen gradually Modifying Factors: Other treatment(s) tried include:has been having weekly wraps applied to both lower extremities at Dr. Zannie Kehr office Associated Signs and Symptoms: Patient reports having increase swelling. 55 year old gentleman who has been treated in the past for venous ulcers of the lower extremity is also known to be a smoker for the last 20 years and smokes about a pack of cigarettes a day. He has been seen by Dr. Evette Cristal who has been treating left lower extremity venous ulcer with an Unna boot and have consulted vascular surgery to be seen by Dr. Gilda Crease. the patient was also recently admitted to the hospital on 04/17/2016 and discharged on 04/19/2016 with bleeding, hyponatremia, cellulitis of the right leg, laceration of the right foot. 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 He was admitted to hospital with significant bleeding from the ulcer and initially started on IV antibiotics for underlying cellulitis which was suspected. Patient was seen by Dr. Evie Lacks of surgery and Dr Alberteen Spindle of podiatry. Dr. Graciela Husbands had done excisional debridement of some of the superficial skin slough as well as the ulcerative area on the posterior aspect of the left ankle region. Unna's boots was applied. He was asked to follow-up at the wound center. He is on Xarelto for his chronic atrial fibrillation. 04/30/2016 -- the patient was seen by the PA at the vein and vascular practice who did a consultation but did not have any investigations ordered or done. The patient continues to have a lot of oozing from his wounds. 06/04/2016 -- he has seen his PCP who put him on Lasix  40 mg daily along with potassium. [name], [name] ([id number]) Objective Constitutional Pulse regular. Respirations normal and unlabored. Afebrile. Vitals Time Taken: 3:16 PM, Height: 76 in, Weight: 320 lbs, BMI: 38.9, Temperature: 98.4 F, Pulse: 66 bpm, Respiratory Rate: 20 breaths/min, Blood Pressure: 146/98 mmHg. Eyes Nonicteric. Reactive to light. Ears, Nose, Mouth, and Throat Lips, teeth, and gums WNL.Marland Kitchen Moist mucosa without lesions. Neck supple and nontender. No palpable supraclavicular or cervical adenopathy. Normal sized without goiter. Respiratory WNL. No retractions.. Breath sounds WNL, No rubs, rales, rhonchi, or wheeze.. Cardiovascular Heart rhythm and rate regular, no murmur or gallop.. Pedal Pulses WNL. No clubbing, cyanosis or edema. Chest Breasts symmetical and no nipple discharge.. Breast tissue WNL, no masses, lumps, or tenderness.. Lymphatic  No adneopathy. No adenopathy. No adenopathy. Musculoskeletal Adexa without tenderness or enlargement.. Digits and nails w/o clubbing, cyanosis, infection, petechiae, ischemia, or inflammatory conditions.Marland Kitchen Psychiatric Judgement and insight Intact.. No evidence of depression, anxiety, or agitation.. General Notes: and on his right leg is completely healed. The left leg there is one small open area on the anterior shin region which has minimal depth. The rest of the wounds have healed. Integumentary (Hair, Skin) No suspicious lesions. No crepitus or fluctuance. No peri-wound warmth or erythema. No masses.Marland Kitchen Demonbreun, [name] ([id number]) Wound #2 status is Open. Original cause of wound was Gradually Appeared. The wound is located on the Left,Circumferential Lower Leg. The wound measures 0cm length x 0cm width x 0cm depth; 0cm^2 area and 0cm^3 volume. The wound is limited to skin breakdown. There is no tunneling or undermining noted. There is a none present amount of drainage noted. The wound margin is distinct with the  outline attached to the wound base. There is no granulation within the wound bed. There is no necrotic tissue within the wound bed. The periwound skin appearance did not exhibit: Localized Edema. Periwound temperature was noted as No Abnormality. The periwound has tenderness on palpation. Wound #5 status is Open. Original cause of wound was Gradually Appeared. The wound is located on the Left,Anterior Lower Leg. The wound measures 0.2cm length x 0.2cm width x 0.1cm depth; 0.031cm^2 area and 0.003cm^3 volume. The wound is limited to skin breakdown. There is no tunneling or undermining noted. There is a large amount of serous drainage noted. The wound margin is flat and intact. There is no granulation within the wound bed. There is a large (67-100%) amount of necrotic tissue within the wound bed including Adherent Slough. The periwound skin appearance exhibited: Moist. Periwound temperature was noted as No Abnormality. The periwound has tenderness on palpation. 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 Anesthetic: Wound #5 Left,Anterior Lower Leg: Topical Lidocaine 4% cream applied to wound bed prior to debridement - for clinic use Skin Barriers/Peri-Wound Care: Wound #5 Left,Anterior Lower Leg: Moisturizing lotion Primary Wound Dressing: Wound #5 Left,Anterior Lower Leg: Aquacel Ag Secondary Dressing: [name], [name] J. ([id number]) Wound #5 Left,Anterior Lower Leg: Boardered Foam Dressing Dressing Change Frequency: Wound #5 Left,Anterior  Lower Leg: Change dressing every other day. Follow-up Appointments: Wound #5 Left,Anterior Lower Leg: Return Appointment in 1 week. Edema Control: Wound #5 Left,Anterior Lower Leg: Elevate legs to the level of the heart and pump ankles as often as possible Support Garment 20-30 mm/Hg pressure to: - Juxtalites Additional Orders / Instructions: Wound #5 Left,Anterior Lower Leg: Stop Smoking Increase protein intake. We will use a small pieces of silver alginate over the wounds and then continue with his juxta light stockings. Elevation and exercise have been specified and he is going to be working hard to completely give up smoking. Electronic Signature(s) Signed: 07/16/2016 4:21:29 PM By: Evlyn Kanner MD, FACS Previous Signature: 07/16/2016 3:29:16 PM Version By: Evlyn Kanner MD, FACS Entered By: Evlyn Kanner on 07/16/2016 16:21:29 [name], [name] ([id number]) -------------------------------------------------------------------------------- SuperBill Details Patient Name: [name] Date of Service: 07/16/2016 Medical Record Number: [id number] Patient Account Number:  [id number] Date of Birth/Sex: 01-26-1961 (55 y.o. 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 I87.032 Postthrombotic syndrome with ulcer and inflammation of left lower extremity F17.218 Nicotine dependence, cigarettes, with other nicotine-induced disorders Facility Procedures CPT4 Code: [id number] Description: 99213 - WOUND CARE VISIT-LEV 3 EST PT Modifier: Quantity: 1 Physician Procedures CPT4: Description Modifier Quantity Code [id number] 99213 - WC PHYS LEVEL 3 - EST PT 1 ICD-10  Description Diagnosis I89.0 Lymphedema, not elsewhere classified 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

## 2016-07-26 ENCOUNTER — Encounter: Payer: BC Managed Care – PPO | Admitting: Surgery

## 2016-07-26 DIAGNOSIS — I890 Lymphedema, not elsewhere classified: Secondary | ICD-10-CM | POA: Diagnosis not present

## 2016-07-30 NOTE — Progress Notes (Signed)
DIMITRY, HOLSWORTH ([id number]) Visit Report for 07/26/2016 Chief Complaint Document Details Patient Name: [name], [name] Date of Service: 07/26/2016 1:30 PM Medical Record Number: [id number] Patient Account Number: [id number] Date of Birth/Sex: 05-Oct-1961 (55 y.o. Male) Treating RN: Huel Coventry Primary Care Physician: Corky Downs Other Clinician: Referring Physician: Corky Downs Treating Physician/Extender: Rudene Re in Treatment: 13 Information Obtained from: Patient Chief Complaint Patient presents to the wound care center for a consult due non healing wound to both lower extremities and accompanied by swelling and this has been worse for the last 5 months. He says the swelling of both lower extremity has been there for at least 6-7 years. Electronic Signature(s) Signed: 07/26/2016 2:31:31 PM By: Evlyn Kanner MD, FACS Entered By: Evlyn Kanner on 07/26/2016 14:31:30 Deterding, [name] ([id number]) -------------------------------------------------------------------------------- HPI Details Patient Name: [name] Date of Service: 07/26/2016 1:30 PM Medical Record Number: [id number] Patient Account Number: [id number] Date of Birth/Sex: [birth year] (55 y.o. Male) Treating RN: Huel Coventry Primary Care Physician: Corky Downs Other Clinician: Referring Physician: Corky Downs Treating Physician/Extender: Rudene Re in Treatment: 13 History of Present Illness Location: ulceration and weeping of both lower extremities left more than the right Quality: Patient reports experiencing heaviness to affected area(s). Severity: Patient states wound are getting worse. Duration: Patient has had the wound for > 5 months prior to seeking treatment at the wound center Timing: Pain in wound is Intermittent (comes and goes Context: The wound would happen gradually Modifying Factors: Other treatment(s) tried include:has been having weekly wraps applied to both  lower extremities at Dr. Zannie Kehr office Associated Signs and Symptoms: Patient reports having increase swelling. HPI Description: 55 year old gentleman who has been treated in the past for venous ulcers of the lower extremity is also known to be a smoker for the last 20 years and smokes about a pack of cigarettes a day. He has been seen by Dr. Evette Cristal who has been treating left lower extremity venous ulcer with an Unna boot and have consulted vascular surgery to be seen by Dr. Gilda Crease. the patient was also recently admitted to the hospital on 04/17/2016 and discharged on 04/19/2016 with bleeding, hyponatremia, cellulitis of the right leg, laceration of the right foot. 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 He was admitted to hospital with significant bleeding from the ulcer and initially started on IV antibiotics for underlying cellulitis which was suspected. Patient was seen by Dr. Evie Lacks of surgery and Dr Alberteen Spindle of podiatry. Dr. Graciela Husbands had done excisional debridement of some of the superficial skin slough as well as the ulcerative area on the posterior aspect of the left ankle region. Unna's boots was applied. He was asked to follow-up at the wound center. He is on Xarelto for his chronic atrial fibrillation. 04/30/2016 -- the patient was seen by the PA at the vein and vascular practice who did a consultation but did not have any investigations ordered or done. The patient continues to have a lot of oozing from his wounds. 06/04/2016 -- he has seen his PCP who put him on Lasix 40 mg daily along with potassium. Electronic Signature(s) Signed: 07/26/2016 2:31:36 PM By: Evlyn Kanner MD, FACS Entered By: Evlyn Kanner on 07/26/2016 14:31:36 Daye, [name] ([id number]) -------------------------------------------------------------------------------- Physical Exam Details Patient Name: [name] Date of Service: 07/26/2016 1:30 PM Medical Record Number: [id number] Patient Account Number: [id number] Date of Birth/Sex: December 18, 1960 (55 y.o. Male) Treating RN: Huel Coventry Primary  Care Physician: Corky DownsMASOUD, [name] Other Clinician: Referring Physician: Corky DownsMASOUD, [name] Treating Physician/Extender: Rudene ReBritto, Dixie Coppa Weeks in Treatment: 13 Constitutional . Pulse regular. Respirations normal and unlabored. Afebrile. . Eyes Nonicteric. Reactive to light. Ears, Nose, Mouth, and Throat Lips, teeth, and gums WNL.Marland Kitchen. Moist mucosa without lesions. Neck supple and nontender. No palpable supraclavicular or cervical adenopathy. Normal sized without goiter. Respiratory WNL. No retractions.. Cardiovascular Pedal Pulses WNL. No clubbing, cyanosis or edema. Lymphatic No adneopathy. No adenopathy. No adenopathy. Musculoskeletal Adexa without tenderness or enlargement.. Digits and nails w/o clubbing, cyanosis, infection, petechiae, ischemia, or inflammatory conditions.. Integumentary (Hair, Skin) No suspicious lesions. No crepitus or fluctuance. No peri-wound warmth or erythema. No masses.Marland Kitchen. Psychiatric Judgement and insight Intact.. No evidence of depression, anxiety, or agitation.. Notes the left leg has increased lymphedema this week and there is some excoriation around the dressing possibly from fluid overload. No sharp debridement was required today Electronic Signature(s) Signed: 07/26/2016 2:32:29 PM By: Evlyn KannerBritto, Totiana Everson MD, FACS Entered By: Evlyn KannerBritto, Quadre Bristol on 07/26/2016 14:32:28 Wunder, [name] ([id number]) -------------------------------------------------------------------------------- Physician Orders Details Patient Name: [name] RouteAMERON, [name] J. Date of Service: 07/26/2016 1:30 PM Medical Record Number: [id number] Patient Account Number: [id number] Date of Birth/Sex: 1961-09-13 74(55 y.o. Male) Treating RN: Huel CoventryWoody, Kim Primary Care Physician: Corky DownsMASOUD, [name] Other Clinician: Referring Physician: Corky DownsMASOUD,  [name] Treating Physician/Extender: Rudene ReBritto, Amiria Orrison Weeks in Treatment: 4313 Verbal / Phone Orders: Yes Clinician: Huel CoventryWoody, Kim Read Back and Verified: Yes Diagnosis Coding Wound Cleansing Wound #5 Left,Anterior Lower Leg o Cleanse wound with mild soap and water o May Shower, gently pat wound dry prior to applying new dressing. Anesthetic Wound #5 Left,Anterior Lower Leg o Topical Lidocaine 4% cream applied to wound bed prior to debridement - for clinic use Skin Barriers/Peri-Wound Care Wound #5 Left,Anterior Lower Leg o Moisturizing lotion Primary Wound Dressing Wound #5 Left,Anterior Lower Leg o Other: - tritec silver Dressing Change Frequency Wound #5 Left,Anterior Lower Leg o Change dressing every week Follow-up Appointments Wound #5 Left,Anterior Lower Leg o Return Appointment in 1 week. Edema Control Wound #5 Left,Anterior Lower Leg o 4-Layer Compression System - Left Lower Extremity o Support Garment 20-30 mm/Hg pressure to: - Juxtalites Additional Orders / Instructions Wound #5 Left,Anterior Lower Leg o Stop Smoking o Increase protein intake. [name] RouteDAMERON, [name] J. ([id number]) Electronic Signature(s) Signed: 07/26/2016 4:23:27 PM By: Evlyn KannerBritto, Aarush Stukey MD, FACS Signed: 07/29/2016 5:06:33 PM By: Elliot GurneyWoody, RN, BSN, Kim RN, BSN Entered By: Elliot GurneyWoody, RN, BSN, Kim on 07/26/2016 14:23:45 [place], [name] ([id number]) -------------------------------------------------------------------------------- Problem List Details Patient Name: [name] RouteAMERON, Agustine J. Date of Service: 07/26/2016 1:30 PM Medical Record Number: [id number] Patient Account Number: [id number] Date of Birth/Sex: 1961-09-13 54(55 y.o. 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 ([id number]) -------------------------------------------------------------------------------- Progress Note Details Patient Name: [name] RouteAMERON, Tadeusz J. Date of Service: 07/26/2016 1:30 PM Medical Record Number: [id number] Patient Account Number: [id number] Date of Birth/Sex: 1961-09-13 36(55 y.o. Male) Treating RN: Huel CoventryWoody, Kim Primary Care Physician: Corky DownsMASOUD, [name] Other Clinician: Referring Physician:  MASOUD, [name] Treating Physician/Extender: Rudene Re in Treatment: 13 Subjective Chief Complaint Information obtained from Patient Patient presents to the wound care center for a consult due non healing wound to both lower extremities and accompanied by swelling and this has been worse for the last 5 months. He says the swelling of both lower extremity has been there for at least 6-7 years. History of Present Illness (HPI) The following HPI elements were documented for the patient's wound: Location: ulceration and weeping of both lower extremities left more than the right Quality: Patient reports experiencing heaviness to affected area(s). Severity: Patient states wound are getting worse. Duration: Patient has had the wound for > 5 months prior to seeking treatment at the wound center Timing: Pain in wound is Intermittent (comes and goes Context: The wound would happen gradually Modifying Factors: Other treatment(s) tried include:has been having weekly wraps  applied to both lower extremities at Dr. Zannie Kehr office Associated Signs and Symptoms: Patient reports having increase swelling. 55 year old gentleman who has been treated in the past for venous ulcers of the lower extremity is also known to be a smoker for the last 20 years and smokes about a pack of cigarettes a day. He has been seen by Dr. Evette Cristal who has been treating left lower extremity venous ulcer with an Unna boot and have consulted vascular surgery to be seen by Dr. Gilda Crease. the patient was also recently admitted to the hospital on 04/17/2016 and discharged on 04/19/2016 with bleeding, hyponatremia, cellulitis of the right leg, laceration of the right foot. 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 He was admitted to hospital with significant bleeding from the ulcer and initially started on IV antibiotics for underlying cellulitis which was suspected. Patient was seen by Dr. Evie Lacks of surgery and Dr Alberteen Spindle of podiatry. Dr. Graciela Husbands had done excisional debridement of some of the superficial skin slough as well as the ulcerative area on the posterior aspect of the left ankle region. Unna's boots was applied. He was asked to follow-up at the wound center. He is on Xarelto for his chronic atrial fibrillation. 04/30/2016 -- the patient was seen by the PA at the vein and vascular practice who did a consultation but did not have any investigations ordered or done. The patient continues to have a lot of oozing from his wounds. 06/04/2016 -- he has seen his PCP who put him on Lasix 40 mg daily along with potassium. Aloisi, [name] ([id number]) Objective Constitutional Pulse regular. Respirations normal and unlabored. Afebrile. Vitals Time Taken: 2:00 PM, Height: 76 in, Weight: 320 lbs, BMI: 38.9, Temperature: 98.1 F, Pulse: 76 bpm, Respiratory Rate: 20 breaths/min, Blood Pressure: 153/97 mmHg. General  Notes: Saw Cardiologist last week regarding high blood pressure Eyes Nonicteric. Reactive to light. Ears, Nose, Mouth, and Throat Lips, teeth, and gums WNL.Marland Kitchen Moist mucosa without lesions. Neck supple and nontender. No palpable supraclavicular or cervical adenopathy. Normal sized without goiter. Respiratory WNL. No retractions.. Cardiovascular Pedal Pulses WNL. No clubbing, cyanosis or edema. Lymphatic No adneopathy. No adenopathy. No adenopathy. Musculoskeletal Adexa without tenderness or enlargement.. Digits and nails w/o clubbing, cyanosis, infection, petechiae, ischemia, or inflammatory conditions.Marland Kitchen Psychiatric Judgement and insight Intact.. No evidence of depression, anxiety, or agitation.. General Notes: the left leg has increased lymphedema this week and there is some excoriation around the dressing possibly from fluid overload. No sharp debridement was required today Integumentary (Hair, Skin) No suspicious lesions. No crepitus or fluctuance. No  peri-wound warmth or erythema. No masses.. Wound #5 status is Open. Original cause of wound was Gradually Appeared. The wound is located on the Left,Anterior Lower Leg. The wound measures 0.2cm length x 0.2cm width x 0.1cm depth; 0.031cm^2 area Reim, [name] J. ([id number]) and 0.003cm^3 volume. The wound is limited to skin breakdown. There is no tunneling or undermining noted. There is a medium amount of serous drainage noted. The wound margin is flat and intact. There is no granulation within the wound bed. There is a large (67-100%) amount of necrotic tissue within the wound bed including Adherent Slough. The periwound skin appearance exhibited: Excoriation, Moist, Erythema. The periwound skin appearance did not exhibit: Callus, Crepitus, Fluctuance, Friable, Induration, Localized Edema, Rash, Scarring, Dry/Scaly, Maceration, Atrophie Blanche, Cyanosis, Ecchymosis, Hemosiderin Staining, Mottled, Pallor, Rubor. The surrounding wound  skin color is noted with erythema which is circumferential. Periwound temperature was noted as No Abnormality. The periwound has tenderness on palpation. 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 Anesthetic: Wound #5 Left,Anterior Lower Leg: Topical Lidocaine 4% cream applied to wound bed prior to debridement - for clinic use Skin Barriers/Peri-Wound Care: Wound #5 Left,Anterior Lower Leg: Moisturizing lotion Primary Wound Dressing: Wound #5 Left,Anterior Lower Leg: Other: - tritec silver Dressing Change Frequency: Wound #5 Left,Anterior Lower Leg: Change dressing every week Follow-up Appointments: Wound #5 Left,Anterior Lower Leg: Return Appointment in 1 week. Edema Control: [name] RouteDAMERON, Haydon J. ([id number]) Wound #5 Left,Anterior Lower Leg: 4-Layer Compression System - Left Lower Extremity Support Garment 20-30 mm/Hg pressure to: - Juxtalites Additional Orders / Instructions: Wound #5 Left,Anterior Lower Leg: Stop Smoking Increase protein intake. We will use some Tritec foam ultra silver 4 layer wrap on his left lower extremity this week. I have asked him to take care of his salt intake, watch his fluids and increase his diuretic as per his physician's advice. Elevation and exercise has also been stressed Electronic Signature(s) Signed: 07/26/2016 2:33:48 PM By: Evlyn KannerBritto, Janay Canan MD, FACS Entered By: Evlyn KannerBritto, Anacleto Batterman on 07/26/2016 14:33:48 [place], [name]  ([id number]) -------------------------------------------------------------------------------- SuperBill Details Patient Name: [name] RouteAMERON, Aarya J. Date of Service: 07/26/2016 Medical Record Number: [id number] Patient Account Number: [id number] Date of Birth/Sex: 07/23/1961 [age] y.o. 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 F17.218 Nicotine dependence, cigarettes, with other nicotine-induced disorders Facility Procedures CPT4: Description Modifier Quantity Code [id number] (Facility Use Only) [id number] - APPLY MULTLAY COMPRS LWR LT 1 LEG Physician Procedures CPT4: Description Modifier Quantity Code [id number] 99213 - WC PHYS LEVEL 3 - EST PT 1 ICD-10 Description Diagnosis I89.0 Lymphedema, not elsewhere classified 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

## 2016-07-30 NOTE — Progress Notes (Signed)
JAMAIR, CATO ([id number]) Visit Report for 07/26/2016 Arrival Information Details Patient Name: [name], [name] Date of Service: 07/26/2016 1:30 PM Medical Record Number: [id number] Patient Account Number: [id number] Date of Birth/Sex: 1961-02-06 (55 y.o. Male) Treating RN: Huel Coventry Primary Care Physician: Corky Downs Other Clinician: Referring Physician: Corky Downs Treating Physician/Extender: Rudene Re in Treatment: 13 Visit Information History Since Last Visit Added or deleted any medications: No Patient Arrived: Ambulatory Any new allergies or adverse reactions: No Arrival Time: 13:59 Had a fall or experienced change in No Accompanied By: self activities of daily living that may affect Transfer Assistance: None risk of falls: Patient Identification Verified: Yes Signs or symptoms of abuse/neglect since last No Secondary Verification Process Yes visito Completed: Hospitalized since last visit: No Patient Requires Transmission- No Has Dressing in Place as Prescribed: Yes Based Precautions: Has Compression in Place as Prescribed: Yes Patient Has Alerts: Yes Pain Present Now: No Patient Alerts: Patient on Blood Thinner [name] Electronic Signature(s) Signed: 07/29/2016 5:06:33 PM By: Elliot Gurney, RN, BSN, Kim RN, BSN Entered By: Elliot Gurney, RN, BSN, Kim on 07/26/2016 14:00:21 Reece, [name] ([id number]) -------------------------------------------------------------------------------- Encounter Discharge Information Details Patient Name: [name] Route Date of Service: 07/26/2016 1:30 PM Medical Record Number: [id number] Patient Account Number: [id number] Date of Birth/Sex: 10-13-1961 (55 y.o. Male) Treating RN: Huel Coventry Primary Care Physician: Corky Downs Other Clinician: Referring Physician: Corky Downs Treating Physician/Extender: Rudene Re in Treatment: 13 Encounter Discharge Information Items Discharge Pain Level: 0 Discharge  Condition: Stable Ambulatory Status: Ambulatory Discharge Destination: Home Transportation: Private Auto Accompanied By: self Schedule Follow-up Appointment: Yes Medication Reconciliation completed and provided to Patient/Care Yes Yuritzi Kamp: Provided on Clinical Summary of Care: 07/26/2016 Form Type Recipient Paper Patient MD Electronic Signature(s) Signed: 07/29/2016 5:06:33 PM By: Elliot Gurney, RN, BSN, Kim RN, BSN Previous Signature: 07/26/2016 2:24:11 PM Version By: Gwenlyn Perking Entered By: Elliot Gurney RN, BSN, Kim on 07/26/2016 14:25:03 Byrns, [name] ([id number]) -------------------------------------------------------------------------------- Lower Extremity Assessment Details Patient Name: [name] Route Date of Service: 07/26/2016 1:30 PM Medical Record Number: [id number] Patient Account Number: [id number] Date of Birth/Sex: [birth year] (55 y.o. Male) Treating RN: Huel Coventry Primary Care Physician: Corky Downs Other Clinician: Referring Physician: Corky Downs Treating Physician/Extender: Rudene Re in Treatment: 13 Edema Assessment Assessed: [Left: No] [Right: No] E[Left: dema] [Right: :] Calf Left: Right: Point of Measurement: 38 cm From Medial Instep 49.6 cm cm Ankle Left: Right: Point of Measurement: 13 cm From Medial Instep 28.3 cm cm Vascular Assessment Pulses: Posterior Tibial Dorsalis Pedis Palpable: [Left:Yes] Extremity colors, hair growth, and conditions: Extremity Color: [Left:Red] Hair Growth on Extremity: [Left:Yes] Temperature of Extremity: [Left:Warm] Notes Patient wound not let me take his sock off the check cap refill for toe nail assessment. Electronic Signature(s) Signed: 07/29/2016 5:06:33 PM By: Elliot Gurney, RN, BSN, Kim RN, BSN Entered By: Elliot Gurney, RN, BSN, Kim on 07/26/2016 14:06:09 Robison, [name] ([id number]) -------------------------------------------------------------------------------- Multi Wound Chart Details Patient Name:  [name] Route Date of Service: 07/26/2016 1:30 PM Medical Record Number: [id number] Patient Account Number: [id number] Date of Birth/Sex: 07/11/1961 (55 y.o. Male) Treating RN: Huel Coventry Primary Care Physician: Corky Downs Other Clinician: Referring Physician: Corky Downs Treating Physician/Extender: Rudene Re in Treatment: 13 Vital Signs Height(in): 76 Pulse(bpm): 76 Weight(lbs): 320 Blood Pressure 153/97 (mmHg): Body Mass Index(BMI): 39 Temperature(F): 98.1 Respiratory Rate 20 (breaths/min): Photos: [5:No Photos] [N/A:N/A] Wound Location: [5:Left Lower Leg - Anterior] [N/A:N/A] Wounding Event: [5:Gradually Appeared] [N/A:N/A] Primary Etiology: [5:Lymphedema] [N/A:N/A] Comorbid History: [5:Arrhythmia, Hypertension] [N/A:N/A] Date  Acquired: [5:07/04/2016] [N/A:N/A] Weeks of Treatment: [5:2] [N/A:N/A] Wound Status: [5:Open] [N/A:N/A] Measurements L x W x D 0.2x0.2x0.1 [N/A:N/A] (cm) Area (cm) : [5:0.031] [N/A:N/A] Volume (cm) : [5:0.003] [N/A:N/A] % Reduction in Area: [5:84.20%] [N/A:N/A] % Reduction in Volume: 85.00% [N/A:N/A] Classification: [5:Partial Thickness] [N/A:N/A] Exudate Amount: [5:Medium] [N/A:N/A] Exudate Type: [5:Serous] [N/A:N/A] Exudate Color: [5:amber] [N/A:N/A] Wound Margin: [5:Flat and Intact] [N/A:N/A] Granulation Amount: [5:None Present (0%)] [N/A:N/A] Necrotic Amount: [5:Large (67-100%)] [N/A:N/A] Exposed Structures: [5:Fascia: No Fat: No Tendon: No Muscle: No Joint: No Bone: No Limited to Skin Breakdown] [N/A:N/A] Epithelialization: [5:Large (67-100%)] [N/A:N/A] Periwound Skin Texture: Excoriation: Yes N/A N/A Edema: No Induration: No Callus: No Crepitus: No Fluctuance: No Friable: No Rash: No Scarring: No Periwound Skin Moist: Yes N/A N/A Moisture: Maceration: No Dry/Scaly: No Periwound Skin Color: Erythema: Yes N/A N/A Atrophie Blanche: No Cyanosis: No Ecchymosis: No Hemosiderin Staining: No Mottled:  No Pallor: No Rubor: No Erythema Location: Circumferential N/A N/A Temperature: No Abnormality N/A N/A Tenderness on Yes N/A N/A Palpation: Wound Preparation: Ulcer Cleansing: N/A N/A Rinsed/Irrigated with Saline Topical Anesthetic Applied: Other: lidocaine 4% Treatment Notes Electronic Signature(s) Signed: 07/29/2016 5:06:33 PM By: Elliot GurneyWoody, RN, BSN, Kim RN, BSN Entered By: Elliot GurneyWoody, RN, BSN, Kim on 07/26/2016 14:11:27 [place]owal[place]uk, [name] ([id number]) -------------------------------------------------------------------------------- Multi-Disciplinary Care Plan Details Patient Name: [name] RouteAMERON, Braxley J. Date of Service: 07/26/2016 1:30 PM Medical Record Number: [id number] Patient Account Number: [id number] Date of Birth/Sex: 03-02-1961 [age] y.o. Male) Treating RN: Huel CoventryWoody, Kim Primary Care Physician: Corky DownsMASOUD, [name] Other Clinician: Referring Physician: Corky DownsMASOUD, [name] Treating Physician/Extender: Rudene ReBritto, Errol Weeks in Treatment: 13 Active Inactive Orientation to the Wound Care Program Nursing Diagnoses: Knowledge deficit related to the wound healing center program Goals: Patient/caregiver will verbalize understanding of the Wound Healing Center Program Date Initiated: 04/23/2016 Goal Status: Active Interventions: Provide education on orientation to the wound center Notes: Pain, Acute or Chronic Nursing Diagnoses: Pain, acute or chronic: actual or potential Potential alteration in comfort, pain Goals: Patient will verbalize adequate pain control and receive pain control interventions during procedures as needed Date Initiated: 04/23/2016 Goal Status: Active Patient/caregiver will verbalize adequate pain control between visits Date Initiated: 04/23/2016 Goal Status: Active Interventions: Assess comfort goal upon admission Complete pain assessment as per visit requirements Notes: Wound/Skin Impairment File, Nicholas Shela CommonsJ. ([id number]) Nursing Diagnoses: Impaired tissue  integrity Knowledge deficit related to smoking impact on wound healing Knowledge deficit related to ulceration/compromised skin integrity Goals: Ulcer/skin breakdown will have a volume reduction of 30% by week 4 Date Initiated: 04/23/2016 Goal Status: Active Ulcer/skin breakdown will have a volume reduction of 50% by week 8 Date Initiated: 04/23/2016 Goal Status: Active Ulcer/skin breakdown will have a volume reduction of 80% by week 12 Date Initiated: 04/23/2016 Goal Status: Active Interventions: Assess ulceration(s) every visit Notes: Electronic Signature(s) Signed: 07/29/2016 5:06:33 PM By: Elliot GurneyWoody, RN, BSN, Kim RN, BSN Entered By: Elliot GurneyWoody, RN, BSN, Kim on 07/26/2016 14:09:18 Himebaugh, [name] ([id number]) -------------------------------------------------------------------------------- Pain Assessment Details Patient Name: [name] RouteAMERON, Reco J. Date of Service: 07/26/2016 1:30 PM Medical Record Number: [id number] Patient Account Number: [id number] Date of Birth/Sex: 03-02-1961 [age] y.o. Male) Treating RN: Huel CoventryWoody, Kim Primary Care Physician: Corky DownsMASOUD, [name] Other Clinician: Referring Physician: Corky DownsMASOUD, [name] Treating Physician/Extender: Rudene ReBritto, Errol Weeks in Treatment: 13 Active Problems Location of Pain Severity and Description of Pain Patient Has Paino No Site Locations With Dressing Change: No Pain Management and Medication Current Pain Management: Electronic Signature(s) Signed: 07/29/2016 5:06:33 PM By: Elliot GurneyWoody, RN, BSN, Kim RN, BSN Entered By: Elliot GurneyWoody, RN, BSN, Kim on 07/26/2016  14:00:30 [name] RouteDAMERON, Toddy J. ([id number]) -------------------------------------------------------------------------------- Patient/Caregiver Education Details Patient Name: [name] RouteAMERON, Coulson J. Date of Service: 07/26/2016 1:30 PM Medical Record Number: [id number] Patient Account Number: [id number] Date of Birth/Gender: [birth year] (55 y.o. Male) Treating RN: Huel CoventryWoody, Kim Primary Care Physician: Corky DownsMASOUD,  [name] Other Clinician: Referring Physician: Corky DownsMASOUD, [name] Treating Physician/Extender: Rudene ReBritto, Errol Weeks in Treatment: 13 Education Assessment Education Provided To: Patient Education Topics Provided Wound/Skin Impairment: Handouts: Other: do not get wraps wet. Methods: Demonstration, Explain/Verbal Responses: State content correctly Electronic Signature(s) Signed: 07/29/2016 5:06:33 PM By: Elliot GurneyWoody, RN, BSN, Kim RN, BSN Entered By: Elliot GurneyWoody, RN, BSN, Kim on 07/26/2016 14:25:27 Vallejo, [name] ([id number]) -------------------------------------------------------------------------------- Wound Assessment Details Patient Name: [name] RouteAMERON, Mikhai J. Date of Service: 07/26/2016 1:30 PM Medical Record Number: [id number] Patient Account Number: [id number] Date of Birth/Sex: [birth year] [age] y.o. Male) Treating RN: Huel CoventryWoody, Kim Primary Care Physician: Corky DownsMASOUD, [name] Other Clinician: Referring Physician: Corky DownsMASOUD, [name] Treating Physician/Extender: Rudene ReBritto, Errol Weeks in Treatment: 13 Wound Status Wound Number: 5 Primary Etiology: Lymphedema Wound Location: Left Lower Leg - Anterior Wound Status: Open Wounding Event: Gradually Appeared Comorbid History: Arrhythmia, Hypertension Date Acquired: 07/04/2016 Weeks Of Treatment: 2 Clustered Wound: No Photos Photo Uploaded By: Elliot GurneyWoody, RN, BSN, Kim on 07/26/2016 16:02:10 Wound Measurements Length: (cm) 0.2 Width: (cm) 0.2 Depth: (cm) 0.1 Area: (cm) 0.031 Volume: (cm) 0.003 % Reduction in Area: 84.2% % Reduction in Volume: 85% Epithelialization: Large (67-100%) Tunneling: No Undermining: No Wound Description Classification: Partial Thickness Foul Odor Afte Wound Margin: Flat and Intact Exudate Amount: Medium Exudate Type: Serous Exudate Color: amber r Cleansing: No Wound Bed Granulation Amount: None Present (0%) Exposed Structure Necrotic Amount: Large (67-100%) Fascia Exposed: No Necrotic Quality: Adherent Slough Fat Layer  Exposed: No Tendon Exposed: No Bubb, Rishard J. ([id number]) Muscle Exposed: No Joint Exposed: No Bone Exposed: No Limited to Skin Breakdown Periwound Skin Texture Texture Color No Abnormalities Noted: No No Abnormalities Noted: No Callus: No Atrophie Blanche: No Crepitus: No Cyanosis: No Excoriation: Yes Ecchymosis: No Fluctuance: No Erythema: Yes Friable: No Erythema Location: Circumferential Induration: No Hemosiderin Staining: No Localized Edema: No Mottled: No Rash: No Pallor: No Scarring: No Rubor: No Moisture Temperature / Pain No Abnormalities Noted: No Temperature: No Abnormality Dry / Scaly: No Tenderness on Palpation: Yes Maceration: No Moist: Yes Wound Preparation Ulcer Cleansing: Rinsed/Irrigated with Saline Topical Anesthetic Applied: Other: lidocaine 4%, Treatment Notes Wound #5 (Left, Anterior Lower Leg) 1. Cleansed with: Clean wound with Normal Saline 4. Dressing Applied: Other dressing (specify in notes) 7. Secured with 4-Layer Compression System - Left Lower Extremity Notes [employer] Electronic Signature(s) Signed: 07/29/2016 5:06:33 PM By: Elliot GurneyWoody, RN, BSN, Kim RN, BSN Entered By: Elliot GurneyWoody, RN, BSN, Kim on 07/26/2016 14:08:16 Hauck, [name] ([id number]) -------------------------------------------------------------------------------- Vitals Details Patient Name: [name] RouteAMERON, Chaysen J. Date of Service: 07/26/2016 1:30 PM Medical Record Number: [id number] Patient Account Number: [id number] Date of Birth/Sex: [birth year] [age] y.o. Male) Treating RN: Huel CoventryWoody, Kim Primary Care Physician: Corky DownsMASOUD, [name] Other Clinician: Referring Physician: Corky DownsMASOUD, [name] Treating Physician/Extender: Rudene ReBritto, Errol Weeks in Treatment: 13 Vital Signs Time Taken: 14:00 Temperature (F): 98.1 Height (in): 76 Pulse (bpm): 76 Weight (lbs): 320 Respiratory Rate (breaths/min): 20 Body Mass Index (BMI): 38.9 Blood Pressure (mmHg): 153/97 Reference Range: 80 -  120 mg / dl Notes Saw Cardiologist last week regarding high blood pressure Electronic Signature(s) Signed: 07/29/2016 5:06:33 PM By: Elliot GurneyWoody, RN, BSN, Kim RN, BSN Entered By: Elliot GurneyWoody, RN, BSN, Kim on 07/26/2016 14:01:32

## 2016-08-02 ENCOUNTER — Encounter: Payer: BC Managed Care – PPO | Admitting: Surgery

## 2016-08-02 DIAGNOSIS — I890 Lymphedema, not elsewhere classified: Secondary | ICD-10-CM | POA: Diagnosis not present

## 2016-08-02 NOTE — Progress Notes (Addendum)
[name] ([id number]) Visit Report for 08/02/2016 Chief Complaint Document Details Patient Name: [name], [name] Date of Service: 08/02/2016 12:45 PM Medical Record Number: [id number] Patient Account Number: [id number] Date of Birth/Sex: 21-Sep-1961 (55 y.o. Male) Treating RN: Phillis Haggis Primary Care Physician: Corky Downs Other Clinician: Referring Physician: Corky Downs Treating Physician/Extender: Rudene Re in Treatment: 14 Information Obtained from: Patient Chief Complaint Patient presents to the wound care center for a consult due non healing wound to both lower extremities and accompanied by swelling and this has been worse for the last 5 months. He says the swelling of both lower extremity has been there for at least 6-7 years. Electronic Signature(s) Signed: 08/02/2016 1:11:53 PM By: Evlyn Kanner MD, FACS Entered By: Evlyn Kanner on 08/02/2016 13:11:53 Kozlowski, [name] ([id number]) -------------------------------------------------------------------------------- HPI Details Patient Name: [name] Date of Service: 08/02/2016 12:45 PM Medical Record Number: [id number] Patient Account Number: [id number] Date of Birth/Sex: 29-Jun-1961 (55 y.o. Male) Treating RN: Phillis Haggis Primary Care Physician: Corky Downs Other Clinician: Referring Physician: Corky Downs Treating Physician/Extender: Rudene Re in Treatment: 14 History of Present Illness Location: ulceration and weeping of both lower extremities left more than the right Quality: Patient reports experiencing heaviness to affected area(s). Severity: Patient states wound are getting worse. Duration: Patient has had the wound for > 5 months prior to seeking treatment at the wound center Timing: Pain in wound is Intermittent (comes and goes Context: The wound would happen gradually Modifying Factors: Other treatment(s) tried include:has been having weekly wraps applied  to both lower extremities at Dr. Zannie Kehr office Associated Signs and Symptoms: Patient reports having increase swelling. HPI Description: 55 year old gentleman who has been treated in the past for venous ulcers of the lower extremity is also known to be a smoker for the last 20 years and smokes about a pack of cigarettes a day. He has been seen by Dr. Evette Cristal who has been treating left lower extremity venous ulcer with an Unna boot and have consulted vascular surgery to be seen by Dr. Gilda Crease. the patient was also recently admitted to the hospital on 04/17/2016 and discharged on 04/19/2016 with bleeding, hyponatremia, cellulitis of the right leg, laceration of the right foot. 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 He was admitted to hospital with significant bleeding from the ulcer and initially started on IV antibiotics for underlying cellulitis which was suspected. Patient was seen by Dr. Evie Lacks of surgery and Dr Alberteen Spindle of podiatry. Dr. Graciela Husbands had done excisional debridement of some of the superficial skin slough as well as the ulcerative area on the posterior aspect of the left ankle region. Unna's boots was applied. He was asked to follow-up at the wound center. He is on Xarelto for his chronic atrial fibrillation. 04/30/2016 -- the patient was seen by the PA at the [facility] vascular practice who did a consultation but did not have any investigations ordered or done. The patient continues to have a lot of oozing from his wounds. 06/04/2016 -- he has seen his PCP who put him on Lasix 40 mg daily along with potassium. Electronic Signature(s) Signed: 08/02/2016 1:12:00 PM By: Evlyn Kanner MD, FACS Entered By: Evlyn Kanner on 08/02/2016 13:12:00 Bergeron, [name] ([id number]) -------------------------------------------------------------------------------- Physical Exam Details Patient Name: [name] Date of Service: 08/02/2016 12:45 PM Medical Record Number: [id number] Patient Account Number: [id number] Date of Birth/Sex: 1961-09-01 (55 y.o. Male) Treating RN: Ashok Cordia, Debi Primary  Care Physician: Corky DownsMASOUD, [name] Other Clinician: Referring Physician: Corky DownsMASOUD, [name] Treating Physician/Extender: Rudene ReBritto, Natavia Sublette Weeks in Treatment: 14 Constitutional . Pulse regular. Respirations normal and unlabored. Afebrile. . Eyes Nonicteric. Reactive to light. Ears, Nose, Mouth, and Throat Lips, teeth, and gums WNL.Marland Kitchen. Moist mucosa without lesions. Neck supple and nontender. No palpable supraclavicular or cervical adenopathy. Normal sized without goiter. Respiratory WNL. No retractions.. Cardiovascular Pedal Pulses WNL. No clubbing, cyanosis or edema. Lymphatic No adneopathy. No adenopathy. No adenopathy. Musculoskeletal Adexa without tenderness or enlargement.. Digits and nails w/o clubbing, cyanosis, infection, petechiae, ischemia, or inflammatory conditions.. Integumentary (Hair, Skin) No suspicious lesions. No crepitus or fluctuance. No peri-wound warmth or erythema. No masses.Marland Kitchen. Psychiatric Judgement and insight Intact.. No evidence of depression, anxiety, or agitation.. Notes edema is much better today and though his wrap had slipped down a bit, the wound is LOOKING excellent and the wound is looking clean. Electronic Signature(s) Signed: 08/02/2016 1:14:01 PM By: Evlyn KannerBritto, Laurelai Lepp MD, FACS Entered By: Evlyn KannerBritto, Jahlon Baines on 08/02/2016 13:14:00 Urbach, [name] ([id number]) -------------------------------------------------------------------------------- Physician Orders Details Patient Name: [name] RouteAMERON, Tramel J. Date of Service: 08/02/2016 12:45 PM Medical Record Number: [id number] Patient Account Number: [id number] Date of Birth/Sex: May 14, 1961 [age] y.o. 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 Anesthetic Wound #5 Left,Anterior Lower Leg o Topical Lidocaine 4% cream applied to wound bed prior to debridement - for clinic use Skin Barriers/Peri-Wound Care Wound #5 Left,Anterior Lower Leg o Moisturizing lotion Primary Wound Dressing Wound #5 Left,Anterior Lower Leg o Other: - tritec silver Dressing Change Frequency Wound #5 Left,Anterior Lower Leg o Other: - change wrap Friday 08/06/16 Follow-up Appointments Wound #5 Left,Anterior Lower Leg o Nurse Visit as needed - on Friday 08/06/16 Edema Control Wound #5 Left,Anterior Lower Leg Kisiel, Naomi J. ([id number]) o 4-Layer Compression System - Left Lower Extremity o Elevate legs to the level of the heart and pump ankles as often as possible o Support Garment 20-30 mm/Hg pressure to: - Juxtalites Additional Orders / Instructions Wound #5 Left,Anterior Lower Leg o Stop Smoking o Increase protein intake. Electronic Signature(s) Signed: 08/02/2016 4:08:58 PM By: Evlyn KannerBritto, Michille Mcelrath MD, FACS Signed: 08/02/2016 4:58:13 PM By: Alejandro MullingPinkerton, Debra Entered By: Alejandro MullingPinkerton, Debra on 08/02/2016 13:13:31 Dory, [name] ([id number]) -------------------------------------------------------------------------------- Problem List Details Patient Name: [name] RouteAMERON, Nashawn J. Date  of Service: 08/02/2016 12:45 PM Medical Record Number: [id number] Patient Account Number: [id number] Date of Birth/Sex: May 14, 1961 [age] y.o. Male) Treating RN: Phillis HaggisPinkerton, Debi Primary Care Physician: Corky DownsMASOUD, [name] Other Clinician: Referring Physician: Corky DownsMASOUD, [name] Treating Physician/Extender: Rudene ReBritto, Shaketha Jeon Weeks in Treatment: 14 Active Problems ICD-10 Encounter Code Description Active Date Diagnosis I89.0 Lymphedema, not elsewhere classified 04/23/2016 Yes I87.031 Postthrombotic syndrome with ulcer and inflammation of 04/23/2016 Yes right lower extremity I87.032 Postthrombotic syndrome with ulcer and inflammation of 04/23/2016 Yes left lower extremity F17.218 Nicotine dependence, cigarettes, with other nicotine- 04/23/2016 Yes induced disorders Inactive Problems Resolved Problems Electronic Signature(s) Signed: 08/02/2016 1:11:37 PM By: Evlyn KannerBritto, Garrett Bowring MD, FACS Entered  By: Evlyn Kanner on 08/02/2016 13:11:37 Grilliot, [name] ([id number]) -------------------------------------------------------------------------------- Progress Note Details Patient Name: [name] Date of Service: 08/02/2016 12:45 PM Medical Record Number: [id number] Patient Account Number: [id number] Date of Birth/Sex: [birth year] (55 y.o. Male) Treating RN: Phillis Haggis Primary Care Physician: Corky Downs Other Clinician: Referring Physician: Corky Downs Treating Physician/Extender: Rudene Re in Treatment: 14 Subjective Chief Complaint Information obtained from Patient Patient presents to the wound care center for a consult due non healing wound to both lower extremities and accompanied by swelling and this has been worse for the last 5 months. He says the swelling of both lower extremity has been there for at least 6-7 years. History of Present Illness (HPI) The following HPI elements were documented for the patient's wound: Location: ulceration and weeping of both lower extremities  left more than the right Quality: Patient reports experiencing heaviness to affected area(s). Severity: Patient states wound are getting worse. Duration: Patient has had the wound for > 5 months prior to seeking treatment at the wound center Timing: Pain in wound is Intermittent (comes and goes Context: The wound would happen gradually Modifying Factors: Other treatment(s) tried include:has been having weekly wraps applied to both lower extremities at Dr. Zannie Kehr office Associated Signs and Symptoms: Patient reports having increase swelling. 55 year old gentleman who has been treated in the past for venous ulcers of the lower extremity is also known to be a smoker for the last 20 years and smokes about a pack of cigarettes a day. He has been seen by Dr. Evette Cristal who has been treating left lower extremity venous ulcer with an Unna boot and have consulted vascular surgery to be seen by Dr. Gilda Crease. the patient was also recently admitted to the hospital on 04/17/2016 and discharged on 04/19/2016 with bleeding, hyponatremia, cellulitis of the right leg, laceration of the right foot. 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 He was admitted to hospital with significant bleeding from the ulcer and initially started on IV antibiotics for underlying cellulitis which was suspected. Patient was seen by Dr. Evie Lacks of surgery and Dr Alberteen Spindle of podiatry. Dr. Graciela Husbands had done excisional debridement of some of the superficial skin slough as well as the ulcerative area on the posterior aspect of the left ankle region. Unna's boots was applied. He was asked to follow-up at the wound center. He is on Xarelto for his chronic atrial fibrillation. 04/30/2016 -- the patient was seen by the PA at the vein and vascular practice who did a consultation but did not have any investigations ordered or done. The patient continues to have a  lot of oozing from his wounds. 06/04/2016 -- he has seen his PCP who put him on Lasix 40 mg daily along with potassium. Ahlgrim, [name] ([id number]) Objective Constitutional Pulse regular. Respirations normal and unlabored. Afebrile. Vitals Time Taken: 12:50 PM, Height: 76 in, Weight: 320 lbs, BMI: 38.9, Temperature: 98.3 F, Pulse: 88 bpm, Respiratory Rate: 20 breaths/min, Blood Pressure: 138/91 mmHg. Eyes Nonicteric. Reactive to light. Ears, Nose, Mouth, and Throat Lips, teeth, and gums WNL.Marland Kitchen Moist mucosa without lesions. Neck supple and nontender. No palpable supraclavicular or cervical adenopathy. Normal sized without goiter. Respiratory WNL. No retractions.. Cardiovascular Pedal Pulses WNL. No clubbing, cyanosis or edema. Lymphatic No adneopathy. No adenopathy. No adenopathy. Musculoskeletal Adexa without tenderness or enlargement.. Digits and nails w/o clubbing, cyanosis, infection, petechiae, ischemia, or inflammatory conditions.Marland Kitchen Psychiatric Judgement and insight Intact.. No evidence of  depression, anxiety, or agitation.. General Notes: edema is much better today and though his wrap had slipped down a bit, the wound is LOOKING excellent and the wound is looking clean. Integumentary (Hair, Skin) No suspicious lesions. No crepitus or fluctuance. No peri-wound warmth or erythema. No masses.. Wound #5 status is Open. Original cause of wound was Gradually Appeared. The wound is located on the Left,Anterior Lower Leg. The wound measures 0.2cm length x 0.2cm width x 0.1cm depth; 0.031cm^2 area and 0.003cm^3 volume. The wound is limited to skin breakdown. There is no tunneling or undermining Sanderson, Jaquil J. ([id number]) noted. There is a large amount of serosanguineous drainage noted. The wound margin is flat and intact. There is no granulation within the wound bed. There is a large (67-100%) amount of necrotic tissue within the wound bed including Adherent Slough. The  periwound skin appearance exhibited: Excoriation, Moist, Erythema. The periwound skin appearance did not exhibit: Callus, Crepitus, Fluctuance, Friable, Induration, Localized Edema, Rash, Scarring, Dry/Scaly, Maceration, Atrophie Blanche, Cyanosis, Ecchymosis, Hemosiderin Staining, Mottled, Pallor, Rubor. The surrounding wound skin color is noted with erythema which is circumferential. Periwound temperature was noted as No Abnormality. The periwound has tenderness on palpation. 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 Anesthetic: Wound #5 Left,Anterior Lower Leg: Topical Lidocaine 4% cream applied to wound bed prior to debridement - for clinic use Skin Barriers/Peri-Wound Care: Wound #5 Left,Anterior Lower Leg: Moisturizing lotion Primary Wound Dressing: Wound #5 Left,Anterior Lower Leg: Other: - tritec silver Dressing Change Frequency: Wound #5 Left,Anterior Lower Leg: Other: - change wrap Friday 08/06/16 Follow-up Appointments: Wound #5 Left,Anterior Lower Leg: Nurse Visit as needed - on Friday 08/06/16 Edema Control: Wound #5 Left,Anterior Lower Leg: Dillion, Feliciano J. ([id number]) 4-Layer Compression System - Left Lower Extremity Elevate legs to the level of the heart and pump ankles as often as possible Support Garment 20-30 mm/Hg pressure to: - Juxtalites Additional Orders / Instructions: Wound #5 Left,Anterior Lower Leg: Stop Smoking Increase protein intake. We will continue to use Tritec foam and a 3 layer Profore and call  him back on Friday for a nurse visit as he is going out of town after that. Electronic Signature(s) Signed: 08/02/2016 1:14:57 PM By: Evlyn KannerBritto, Isiaha Greenup MD, FACS Entered By: Evlyn KannerBritto, Jacquees Gongora on 08/02/2016 13:14:57 Bridgett, [name] ([id number]) -------------------------------------------------------------------------------- SuperBill Details Patient Name: [name] RouteAMERON, [place] J. Date of Service: 08/02/2016 Medical Record Number: [id number] Patient Account Number: [id number] Date of Birth/Sex: Aug 24, 1961 7(55 y.o. 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 I87.032 Postthrombotic syndrome with ulcer and inflammation of left lower extremity F17.218 Nicotine dependence, cigarettes, with other nicotine-induced disorders Facility Procedures CPT4: Description Modifier Quantity Code [id number] (Facility Use Only) 29581LT - APPLY MULTLAY COMPRS LWR LT 1 LEG Physician Procedures CPT4: Description Modifier Quantity Code [id number] 99213 - WC PHYS LEVEL 3 - EST PT 1 ICD-10 Description Diagnosis I89.0 Lymphedema, not elsewhere classified I87.031 Postthrombotic syndrome with ulcer and inflammation of right  lower extremity I87.032  Postthrombotic syndrome with ulcer and inflammation of left lower extremity F17.218 Nicotine dependence, cigarettes, with other nicotine-induced disorders Electronic Signature(s) Signed: 08/02/2016 4:08:58 PM By: Evlyn Kanner MD, FACS Signed: 08/02/2016 4:58:13 PM By: Alejandro Mulling Previous Signature: 08/02/2016 1:15:27 PM Version By: Evlyn Kanner MD, FACS Entered By: Alejandro Mulling on 08/02/2016 13:39:23

## 2016-08-02 NOTE — Progress Notes (Signed)
[name], [name] J. ([id number]) Visit Report for 08/02/2016 Arrival Information Details Patient Name: [name], [name] J. Date of Service: 08/02/2016 12:45 PM Medical Record Number: [id number] Patient Account Number: [id number] Date of Birth/Sex: 1961-12-02 (55 y.o. Male) Treating RN: Phillis HaggisPinkerton, Debi Primary Care Physician: Corky DownsMASOUD, [name] Other Clinician: Referring Physician: Corky DownsMASOUD, [name] Treating Physician/Extender: Rudene ReBritto, Errol Weeks in Treatment: 14 Visit Information History Since Last Visit All ordered tests and consults were completed: No Patient Arrived: Ambulatory Added or deleted any medications: No Arrival Time: 12:50 Any new allergies or adverse reactions: No Accompanied By: self Had a fall or experienced change in No Transfer Assistance: None activities of daily living that may affect Patient Identification Verified: Yes risk of falls: Secondary Verification Process Yes Signs or symptoms of abuse/neglect since last No Completed: visito Patient Requires Transmission- No Hospitalized since last visit: No Based Precautions: Pain Present Now: No Patient Has Alerts: Yes Patient Alerts: Patient on Blood Thinner [name] Electronic Signature(s) Signed: 08/02/2016 4:58:13 PM By: Alejandro MullingPinkerton, Debra Entered By: Alejandro MullingPinkerton, Debra on 08/02/2016 12:50:32 Gaffey, [name] ([id number]) -------------------------------------------------------------------------------- Encounter Discharge Information Details Patient Name: [name] RouteAMERON, [name] Date of Service: 08/02/2016 12:45 PM Medical Record Number: [id number] Patient Account Number: [id number] Date of Birth/Sex: 1961-12-02 [age] y.o. Male) Treating RN: Phillis HaggisPinkerton, Debi Primary Care Physician: Corky DownsMASOUD, [name] Other Clinician: Referring Physician: Corky DownsMASOUD, [name] Treating Physician/Extender: Rudene ReBritto, Errol Weeks in Treatment: 14 Encounter Discharge Information Items Discharge Pain Level: 0 Discharge Condition: Stable Ambulatory Status:  Ambulatory Discharge Destination: Home Private Transportation: Auto Accompanied By: self Schedule Follow-up Appointment: Yes Medication Reconciliation completed and Yes provided to Patient/Care [name]: Clinical Summary of Care: Electronic Signature(s) Signed: 08/02/2016 4:58:13 PM By: Alejandro MullingPinkerton, Debra Entered By: Alejandro MullingPinkerton, Debra on 08/02/2016 13:16:02 Silberman, [name] ([id number]) -------------------------------------------------------------------------------- Lower Extremity Assessment Details Patient Name: [name] RouteAMERON, [name] Date of Service: 08/02/2016 12:45 PM Medical Record Number: [id number] Patient Account Number: [id number] Date of Birth/Sex: 1961-12-02 [age] y.o. Male) Treating RN: Phillis HaggisPinkerton, Debi Primary Care Physician: Corky DownsMASOUD, [name] Other Clinician: Referring Physician: Corky DownsMASOUD, [name] Treating Physician/Extender: Rudene ReBritto, Errol Weeks in Treatment: 14 Edema Assessment Assessed: [Left: No] [Right: No] E[Left: dema] [Right: :] Calf Left: Right: Point of Measurement: 38 cm From Medial Instep 49.6 cm cm Ankle Left: Right: Point of Measurement: 13 cm From Medial Instep 27.4 cm cm Vascular Assessment Pulses: Posterior Tibial Dorsalis Pedis Palpable: [Left:Yes] Extremity colors, hair growth, and conditions: Extremity Color: [Left:Hyperpigmented] Temperature of Extremity: [Left:Warm] Capillary Refill: [Left:< 3 seconds] Toe Nail Assessment Left: Right: Thick: Yes Discolored: Yes Deformed: No Improper Length and Hygiene: No Electronic Signature(s) Signed: 08/02/2016 4:58:13 PM By: Alejandro MullingPinkerton, Debra Entered By: Alejandro MullingPinkerton, Debra on 08/02/2016 13:00:13 Eller, [name] ([id number]) -------------------------------------------------------------------------------- Multi Wound Chart Details Patient Name: [name] RouteAMERON, [name] J. Date of Service: 08/02/2016 12:45 PM Medical Record Number: [id number] Patient Account Number: [id number] Date of Birth/Sex: 1961-12-02 [age] y.o.  Male) Treating RN: Ashok CordiaPinkerton, Debi Primary Care Physician: Corky DownsMASOUD, [name] Other Clinician: Referring Physician: Corky DownsMASOUD, [name] Treating Physician/Extender: Rudene ReBritto, Errol Weeks in Treatment: 14 Vital Signs Height(in): 76 Pulse(bpm): 88 Weight(lbs): 320 Blood Pressure 138/91 (mmHg): Body Mass Index(BMI): 39 Temperature(F): 98.3 Respiratory Rate 20 (breaths/min): Photos: [5:No Photos] [N/A:N/A] Wound Location: [5:Left Lower Leg - Anterior] [N/A:N/A] Wounding Event: [5:Gradually Appeared] [N/A:N/A] Primary Etiology: [5:Lymphedema] [N/A:N/A] Comorbid History: [5:Arrhythmia, Hypertension] [N/A:N/A] Date Acquired: [5:07/04/2016] [N/A:N/A] Weeks of Treatment: [5:3] [N/A:N/A] Wound Status: [5:Open] [N/A:N/A] Measurements L x W x D 0.2x0.2x0.1 [N/A:N/A] (cm) Area (cm) : [5:0.031] [N/A:N/A] Volume (cm) : [5:0.003] [N/A:N/A] % Reduction in Area: [5:84.20%] [N/A:N/A] % Reduction in Volume: 85.00% [N/A:N/A] Classification: [  5:Partial Thickness] [N/A:N/A] Exudate Amount: [5:Large] [N/A:N/A] Exudate Type: [5:Serosanguineous] [N/A:N/A] Exudate Color: [5:red, brown] [N/A:N/A] Wound Margin: [5:Flat and Intact] [N/A:N/A] Granulation Amount: [5:None Present (0%)] [N/A:N/A] Necrotic Amount: [5:Large (67-100%)] [N/A:N/A] Exposed Structures: [5:Fascia: No Fat: No Tendon: No Muscle: No Joint: No Bone: No Limited to Skin Breakdown] [N/A:N/A] Epithelialization: [5:Large (67-100%)] [N/A:N/A] Periwound Skin Texture: Excoriation: Yes N/A N/A Edema: No Induration: No Callus: No Crepitus: No Fluctuance: No Friable: No Rash: No Scarring: No Periwound Skin Moist: Yes N/A N/A Moisture: Maceration: No Dry/Scaly: No Periwound Skin Color: Erythema: Yes N/A N/A Atrophie Blanche: No Cyanosis: No Ecchymosis: No Hemosiderin Staining: No Mottled: No Pallor: No Rubor: No Erythema Location: Circumferential N/A N/A Temperature: No Abnormality N/A N/A Tenderness on Yes N/A N/A Palpation: Wound  Preparation: Ulcer Cleansing: N/A N/A Rinsed/Irrigated with Saline Topical Anesthetic Applied: Other: lidocaine 4% Treatment Notes Electronic Signature(s) Signed: 08/02/2016 4:58:13 PM By: Alejandro MullingPinkerton, Debra Entered By: Alejandro MullingPinkerton, Debra on 08/02/2016 13:04:35 Imes, [name] ([id number]) -------------------------------------------------------------------------------- Multi-Disciplinary Care Plan Details Patient Name: [name] RouteAMERON, Kasra J. Date of Service: 08/02/2016 12:45 PM Medical Record Number: [id number] Patient Account Number: [id number] Date of Birth/Sex: 04-27-1961 [age] y.o. Male) Treating RN: Phillis HaggisPinkerton, Debi Primary Care Physician: Corky DownsMASOUD, [name] Other Clinician: Referring Physician: Corky DownsMASOUD, [name] Treating Physician/Extender: Rudene ReBritto, Errol Weeks in Treatment: 14 Active Inactive Orientation to the Wound Care Program Nursing Diagnoses: Knowledge deficit related to the wound healing center program Goals: Patient/caregiver will verbalize understanding of the Wound Healing Center Program Date Initiated: 04/23/2016 Goal Status: Active Interventions: Provide education on orientation to the wound center Notes: Pain, Acute or Chronic Nursing Diagnoses: Pain, acute or chronic: actual or potential Potential alteration in comfort, pain Goals: Patient will verbalize adequate pain control and receive pain control interventions during procedures as needed Date Initiated: 04/23/2016 Goal Status: Active Patient/caregiver will verbalize adequate pain control between visits Date Initiated: 04/23/2016 Goal Status: Active Interventions: Assess comfort goal upon admission Complete pain assessment as per visit requirements Notes: Wound/Skin Impairment [place], Delaine Shela CommonsJ. ([id number]) Nursing Diagnoses: Impaired tissue integrity Knowledge deficit related to smoking impact on wound healing Knowledge deficit related to ulceration/compromised skin integrity Goals: Ulcer/skin breakdown  will have a volume reduction of 30% by week 4 Date Initiated: 04/23/2016 Goal Status: Active Ulcer/skin breakdown will have a volume reduction of 50% by week 8 Date Initiated: 04/23/2016 Goal Status: Active Ulcer/skin breakdown will have a volume reduction of 80% by week 12 Date Initiated: 04/23/2016 Goal Status: Active Interventions: Assess ulceration(s) every visit Notes: Electronic Signature(s) Signed: 08/02/2016 4:58:13 PM By: Alejandro MullingPinkerton, Debra Entered By: Alejandro MullingPinkerton, Debra on 08/02/2016 13:04:30 Braithwaite, [name] ([id number]) -------------------------------------------------------------------------------- Pain Assessment Details Patient Name: [name] RouteAMERON, Kiara J. Date of Service: 08/02/2016 12:45 PM Medical Record Number: [id number] Patient Account Number: [id number] Date of Birth/Sex: 04-27-1961 4(55 y.o. Male) Treating RN: Phillis HaggisPinkerton, Debi Primary Care Physician: Corky DownsMASOUD, [name] Other Clinician: Referring Physician: Corky DownsMASOUD, [name] Treating Physician/Extender: Rudene ReBritto, Errol Weeks in Treatment: 14 Active Problems Location of Pain Severity and Description of Pain Patient Has Paino No Site Locations With Dressing Change: No Pain Management and Medication Current Pain Management: Electronic Signature(s) Signed: 08/02/2016 4:58:13 PM By: Alejandro MullingPinkerton, Debra Entered By: Alejandro MullingPinkerton, Debra on 08/02/2016 12:50:37 Vasudevan, [name] ([id number]) -------------------------------------------------------------------------------- Patient/Caregiver Education Details Patient Name: [name] RouteAMERON, Javeon J. Date of Service: 08/02/2016 12:45 PM Medical Record Number: [id number] Patient Account Number: [id number] Date of Birth/Gender: 04-27-1961 [age] y.o. Male) Treating RN: Phillis HaggisPinkerton, Debi Primary Care Physician: Corky DownsMASOUD, [name] Other Clinician: Referring Physician: Corky DownsMASOUD, [name] Treating Physician/Extender: Rudene ReBritto, Errol Weeks in Treatment: 14 Education Assessment Education  Provided To: Patient Education  Topics Provided Wound/Skin Impairment: Handouts: Other: do not get wrap wet Methods: Demonstration, Explain/Verbal Responses: State content correctly Electronic Signature(s) Signed: 08/02/2016 4:58:13 PM By: Alejandro Mulling Entered By: Alejandro Mulling on 08/02/2016 13:16:20 Pulice, [name] ([id number]) -------------------------------------------------------------------------------- Wound Assessment Details Patient Name: [name] Route Date of Service: 08/02/2016 12:45 PM Medical Record Number: [id number] Patient Account Number: [id number] Date of Birth/Sex: Sep 15, 1961 (55 y.o. Male) Treating RN: Phillis Haggis Primary Care Physician: Corky Downs Other Clinician: Referring Physician: Corky Downs Treating Physician/Extender: Rudene Re in Treatment: 14 Wound Status Wound Number: 5 Primary Etiology: Lymphedema Wound Location: Left Lower Leg - Anterior Wound Status: Open Wounding Event: Gradually Appeared Comorbid History: Arrhythmia, Hypertension Date Acquired: 07/04/2016 Weeks Of Treatment: 3 Clustered Wound: No Photos Photo Uploaded By: Alejandro Mulling on 08/02/2016 15:50:49 Wound Measurements Length: (cm) 0.2 Width: (cm) 0.2 Depth: (cm) 0.1 Area: (cm) 0.031 Volume: (cm) 0.003 % Reduction in Area: 84.2% % Reduction in Volume: 85% Epithelialization: Large (67-100%) Tunneling: No Undermining: No Wound Description Classification: Partial Thickness Foul Odor Afte Wound Margin: Flat and Intact Exudate Amount: Large Exudate Type: Serosanguineous Exudate Color: red, brown r Cleansing: No Wound Bed Granulation Amount: None Present (0%) Exposed Structure Necrotic Amount: Large (67-100%) Fascia Exposed: No Necrotic Quality: Adherent Slough Fat Layer Exposed: No Tendon Exposed: No Verde, Drakkar J. ([id number]) Muscle Exposed: No Joint Exposed: No Bone Exposed: No Limited to Skin Breakdown Periwound Skin Texture Texture Color No  Abnormalities Noted: No No Abnormalities Noted: No Callus: No Atrophie Blanche: No Crepitus: No Cyanosis: No Excoriation: Yes Ecchymosis: No Fluctuance: No Erythema: Yes Friable: No Erythema Location: Circumferential Induration: No Hemosiderin Staining: No Localized Edema: No Mottled: No Rash: No Pallor: No Scarring: No Rubor: No Moisture Temperature / Pain No Abnormalities Noted: No Temperature: No Abnormality Dry / Scaly: No Tenderness on Palpation: Yes Maceration: No Moist: Yes Wound Preparation Ulcer Cleansing: Rinsed/Irrigated with Saline Topical Anesthetic Applied: Other: lidocaine 4%, Treatment Notes Wound #5 (Left, Anterior Lower Leg) 1. Cleansed with: Cleanse wound with antibacterial soap and water 2. Anesthetic Topical Lidocaine 4% cream to wound bed prior to debridement 3. Peri-wound Care: Moisturizing lotion 4. Dressing Applied: Other dressing (specify in notes) 5. Secondary Dressing Applied ABD Pad 7. Secured with Tape 4-Layer Compression System - Left Lower Extremity Notes [employer] Electronic Signature(s) MOIZ, RYANT ([id number]) Signed: 08/02/2016 4:58:13 PM By: Alejandro Mulling Entered By: Alejandro Mulling on 08/02/2016 13:03:53 Brunelle, [name] ([id number]) -------------------------------------------------------------------------------- Vitals Details Patient Name: [name] Route Date of Service: 08/02/2016 12:45 PM Medical Record Number: [id number] Patient Account Number: [id number] Date of Birth/Sex: 03-15-1961 (55 y.o. Male) Treating RN: Ashok Cordia, Debi Primary Care Physician: Corky Downs Other Clinician: Referring Physician: Corky Downs Treating Physician/Extender: Rudene Re in Treatment: 14 Vital Signs Time Taken: 12:50 Temperature (F): 98.3 Height (in): 76 Pulse (bpm): 88 Weight (lbs): 320 Respiratory Rate (breaths/min): 20 Body Mass Index (BMI): 38.9 Blood Pressure (mmHg): 138/91 Reference  Range: 80 - 120 mg / dl Electronic Signature(s) Signed: 08/02/2016 4:58:13 PM By: Alejandro Mulling Entered By: Alejandro Mulling on 08/02/2016 12:52:49

## 2016-08-06 DIAGNOSIS — I890 Lymphedema, not elsewhere classified: Secondary | ICD-10-CM | POA: Diagnosis not present

## 2016-08-07 NOTE — Progress Notes (Signed)
WM, SAHAGUN ([id number]) Visit Report for 08/06/2016 Arrival Information Details Patient Name: [name], OLGIN Date of Service: 08/06/2016 3:00 PM Medical Record Number: [id number] Patient Account Number: [id number] Date of Birth/Sex: September 28, 1961 (55 y.o. Male) Treating RN: Curtis Sites Primary Care Physician: Corky Downs Other Clinician: Referring Physician: Corky Downs Treating Physician/Extender: Rudene Re in Treatment: 15 Visit Information History Since Last Visit Added or deleted any medications: No Patient Arrived: Ambulatory Any new allergies or adverse reactions: No Arrival Time: 15:02 Had a fall or experienced change in No Accompanied By: self activities of daily living that may affect Transfer Assistance: None risk of falls: Patient Identification Verified: Yes Signs or symptoms of abuse/neglect since last No Secondary Verification Process Yes visito Completed: Hospitalized since last visit: No Patient Requires Transmission- No Pain Present Now: No Based Precautions: Patient Has Alerts: Yes Patient Alerts: Patient on Blood Thinner [name] Electronic Signature(s) Signed: 08/06/2016 4:42:59 PM By: Curtis Sites Entered By: Curtis Sites on 08/06/2016 15:02:44 Vey, Nolon [name] ([id number]) -------------------------------------------------------------------------------- Encounter Discharge Information Details Patient Name: [name] Date of Service: 08/06/2016 3:00 PM Medical Record Number: [id number] Patient Account Number: [id number] Date of Birth/Sex: 10-08-1961 (55 y.o. Male) Treating RN: Curtis Sites Primary Care Physician: Corky Downs Other Clinician: Referring Physician: Corky Downs Treating Physician/Extender: Rudene Re in Treatment: 15 Encounter Discharge Information Items Discharge Pain Level: 0 Discharge Condition: Stable Ambulatory Status: Ambulatory Discharge Destination:  Home Private Transportation: Auto Accompanied By: self Schedule Follow-up Appointment: Yes Medication Reconciliation completed and No provided to Patient/Care Kateryn Marasigan: Clinical Summary of Care: Electronic Signature(s) Signed: 08/06/2016 3:42:18 PM By: Curtis Sites Entered By: Curtis Sites on 08/06/2016 15:42:18 Marcantel, Nolon [name] ([id number]) -------------------------------------------------------------------------------- Patient/Caregiver Education Details Patient Name: [name] Date of Service: 08/06/2016 3:00 PM Medical Record Number: [id number] Patient Account Number: [id number] Date of Birth/Gender: 06-14-1961 (55 y.o. Male) Treating RN: Curtis Sites Primary Care Physician: Corky Downs Other Clinician: Referring Physician: Corky Downs Treating Physician/Extender: Rudene Re in Treatment: 15 Education Assessment Education Provided To: Patient Education Topics Provided Wound/Skin Impairment: Handouts: Other: wound care if needed while on vacation Methods: Explain/Verbal Responses: State content correctly Electronic Signature(s) Signed: 08/06/2016 4:42:59 PM By: Curtis Sites Entered By: Curtis Sites on 08/06/2016 15:41:56 [name] ([id number]) -------------------------------------------------------------------------------- Wound Assessment Details Patient Name: [name] Date of Service: 08/06/2016 3:00 PM Medical Record Number: [id number] Patient Account Number: [id number] Date of Birth/Sex: Mar 25, 1961 (55 y.o. Male) Treating RN: Curtis Sites Primary Care Physician: Corky Downs Other Clinician: Referring Physician: Corky Downs Treating Physician/Extender: Rudene Re in Treatment: 15 Wound Status Wound Number: 5 Primary Etiology: Lymphedema Wound Location: Left Lower Leg - Anterior Wound Status: Open Wounding Event: Gradually Appeared Comorbid History: Arrhythmia, Hypertension Date Acquired:  07/04/2016 Weeks Of Treatment: 4 Clustered Wound: No Wound Measurements Length: (cm) 0.2 Width: (cm) 0.2 Depth: (cm) 0.1 Area: (cm) 0.031 Volume: (cm) 0.003 % Reduction in Area: 84.2% % Reduction in Volume: 85% Epithelialization: Large (67-100%) Tunneling: No Undermining: No Wound Description Classification: Partial Thickness Wound Margin: Flat and Intact Exudate Amount: Large Exudate Type: Serosanguineous Exudate Color: red, brown Foul Odor After Cleansing: No Wound Bed Granulation Amount: None Present (0%) Exposed Structure Necrotic Amount: Large (67-100%) Fascia Exposed: No Necrotic Quality: Adherent Slough Fat Layer Exposed: No Tendon Exposed: No Muscle Exposed: No Joint Exposed: No Bone Exposed: No Limited to Skin Breakdown Periwound Skin Texture Texture Color No Abnormalities Noted: No No Abnormalities Noted: No Callus: No Atrophie Blanche: No Crepitus: No Cyanosis: No Excoriation: Yes  Ecchymosis: No Fluctuance: No Erythema: Yes [name], [name] ([id number]) Friable: No Erythema Location: Circumferential Induration: No Hemosiderin Staining: No Localized Edema: No Mottled: No Rash: No Pallor: No Scarring: No Rubor: No Moisture Temperature / Pain No Abnormalities Noted: No Temperature: No Abnormality Dry / Scaly: No Tenderness on Palpation: Yes Maceration: No Moist: Yes Wound Preparation Ulcer Cleansing: Rinsed/Irrigated with Saline Topical Anesthetic Applied: Other: lidocaine 4%, Treatment Notes Wound #5 (Left, Anterior Lower Leg) 1. Cleansed with: Clean wound with Normal Saline 2. Anesthetic Topical Lidocaine 4% cream to wound bed prior to debridement 4. Dressing Applied: Other dressing (specify in notes) 5. Secondary Dressing Applied ABD Pad 7. Secured with 4-Layer Compression System - Left Lower Extremity Notes [employer] Electronic Signature(s) Signed: 08/06/2016 4:42:59 PM By: Curtis Sitesorthy, [name] Entered By: Curtis Sitesorthy, [name]  on 08/06/2016 15:39:27

## 2016-08-19 ENCOUNTER — Encounter: Payer: BC Managed Care – PPO | Attending: Surgery | Admitting: Surgery

## 2016-08-19 DIAGNOSIS — I10 Essential (primary) hypertension: Secondary | ICD-10-CM | POA: Diagnosis not present

## 2016-08-19 DIAGNOSIS — F17218 Nicotine dependence, cigarettes, with other nicotine-induced disorders: Secondary | ICD-10-CM | POA: Diagnosis not present

## 2016-08-19 DIAGNOSIS — Z6838 Body mass index (BMI) 38.0-38.9, adult: Secondary | ICD-10-CM | POA: Diagnosis not present

## 2016-08-19 DIAGNOSIS — Z7901 Long term (current) use of anticoagulants: Secondary | ICD-10-CM | POA: Diagnosis not present

## 2016-08-19 DIAGNOSIS — K219 Gastro-esophageal reflux disease without esophagitis: Secondary | ICD-10-CM | POA: Insufficient documentation

## 2016-08-19 DIAGNOSIS — L97821 Non-pressure chronic ulcer of other part of left lower leg limited to breakdown of skin: Secondary | ICD-10-CM | POA: Diagnosis not present

## 2016-08-19 DIAGNOSIS — I87033 Postthrombotic syndrome with ulcer and inflammation of bilateral lower extremity: Secondary | ICD-10-CM | POA: Insufficient documentation

## 2016-08-19 DIAGNOSIS — I890 Lymphedema, not elsewhere classified: Secondary | ICD-10-CM | POA: Insufficient documentation

## 2016-08-19 DIAGNOSIS — I482 Chronic atrial fibrillation: Secondary | ICD-10-CM | POA: Diagnosis not present

## 2016-08-19 DIAGNOSIS — L97811 Non-pressure chronic ulcer of other part of right lower leg limited to breakdown of skin: Secondary | ICD-10-CM | POA: Insufficient documentation

## 2016-08-20 NOTE — Progress Notes (Signed)
[name], [name] J. ([id number]) Visit Report for 08/19/2016 Chief Complaint Document Details Patient Name: [name], [name] J. Date of Service: 08/19/2016 3:00 PM Medical Record Number: [id number] Patient Account Number: [id number] Date of Birth/Sex: [birth year](55 y.o. Male) Treating RN: Phillis HaggisPinkerton, Debi Primary Care Physician: Corky DownsMASOUD, [name] Other Clinician: Referring Physician: Corky DownsMASOUD, [name] Treating Physician/Extender: Rudene ReBritto, Sophina Mitten Weeks in Treatment: 16 Information Obtained from: Patient Chief Complaint Patient presents to the wound care center for a consult due non healing wound to both lower extremities and accompanied by swelling and this has been worse for the last 5 months. He says the swelling of both lower extremity has been there for at least 6-7 years. Electronic Signature(s) Signed: 08/19/2016 3:32:00 PM By: Evlyn KannerBritto, Erza Mothershead MD, FACS Entered By: Evlyn KannerBritto, Briant Angelillo on 08/19/2016 15:32:00 Fojtik, [name] ([id number]) -------------------------------------------------------------------------------- HPI Details Patient Name: [name], [name] J. Date of Service: 08/19/2016 3:00 PM Medical Record Number: [id number] Patient Account Number: [id number] Date of Birth/Sex: [birth year](55 y.o. Male) Treating RN: Phillis HaggisPinkerton, Debi Primary Care Physician: Corky DownsMASOUD, [name] Other Clinician: Referring Physician: Corky DownsMASOUD, [name] Treating Physician/Extender: Rudene ReBritto, Kori Colin Weeks in Treatment: 16 History of Present Illness Location: ulceration and weeping of both lower extremities left more than the right Quality: Patient reports experiencing heaviness to affected area(s). Severity: Patient states wound are getting worse. Duration: Patient has had the wound for > 5 months prior to seeking treatment at the wound center Timing: Pain in wound is Intermittent (comes and goes Context: The wound would happen gradually Modifying Factors: Other treatment(s) tried include:has been having weekly wraps applied to both  lower extremities at Dr. Zannie KehrShankar's office Associated Signs and Symptoms: Patient reports having increase swelling. HPI Description: 55 year old gentleman who has been treated in the past for venous ulcers of the lower extremity is also known to be a smoker for the last 20 years and smokes about a pack of cigarettes a day. He has been seen by Dr. Evette CristalSankar who has been treating left lower extremity venous ulcer with an Unna boot and have consulted vascular surgery to be seen by Dr. Gilda CreaseSchnier. the patient was also recently admitted to the hospital on 04/17/2016 and discharged on 04/19/2016 with bleeding, hyponatremia, cellulitis of the right leg, laceration of the right foot. 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 He was admitted to hospital with significant bleeding from the ulcer and initially started on IV antibiotics for underlying cellulitis which was suspected. Patient was seen by Dr. Evie LacksEsco of surgery and Dr Alberteen Spindleline of podiatry. Dr. Graciela HusbandsKlein had done excisional debridement of some of the superficial skin slough as well as the ulcerative area on the posterior aspect of the left ankle region. Unna's boots was applied. He was asked to follow-up at the wound center. He is on Xarelto for his chronic atrial fibrillation. 04/30/2016 -- the patient was seen by the PA at the [facility] vascular [facility] who did a consultation but did not have any investigations ordered or done. The patient continues to have a lot of oozing from his wounds. 06/04/2016 -- he has seen his PCP who put him on Lasix 40 mg daily along with potassium. Electronic Signature(s) Signed: 08/19/2016 3:32:04 PM By: Evlyn KannerBritto, Elishah Ashmore MD, FACS Entered By: Evlyn KannerBritto, Braeden Dolinski on 08/19/2016 15:32:04 [name], [name] ([id number]) -------------------------------------------------------------------------------- Physical Exam Details Patient Name: [name], [name]  J. Date of Service: 08/19/2016 3:00 PM Medical Record Number: [id number] Patient Account Number: [id number] Date of Birth/Sex: [birth year](55 y.o. Male) Treating RN: Ashok CordiaPinkerton, Debi Primary  Care Physician: Corky Downs Other Clinician: Referring Physician: Corky Downs Treating Physician/Extender: Rudene Re in Treatment: 16 Constitutional . Pulse regular. Respirations normal and unlabored. Afebrile. . Eyes Nonicteric. Reactive to light. Ears, Nose, Mouth, and Throat Lips, teeth, and gums WNL.Marland Kitchen Moist mucosa without lesions. Neck supple and nontender. No palpable supraclavicular or cervical adenopathy. Normal sized without goiter. Respiratory WNL. No retractions.. Breath sounds WNL, No rubs, rales, rhonchi, or wheeze.. Cardiovascular Heart rhythm and rate regular, no murmur or gallop.. Pedal Pulses WNL. No clubbing, cyanosis or edema. Chest Breasts symmetical and no nipple discharge.. Breast tissue WNL, no masses, lumps, or tenderness.. Lymphatic No adneopathy. No adenopathy. No adenopathy. Musculoskeletal Adexa without tenderness or enlargement.. Digits and nails w/o clubbing, cyanosis, infection, petechiae, ischemia, or inflammatory conditions.. Integumentary (Hair, Skin) No suspicious lesions. No crepitus or fluctuance. No peri-wound warmth or erythema. No masses.Marland Kitchen Psychiatric Judgement and insight Intact.. No evidence of depression, anxiety, or agitation.. Notes the left lower extremity has 2 open areas which still have superficial ulcers but overall it is looking excellent. Electronic Signature(s) Signed: 08/19/2016 3:32:39 PM By: Evlyn Kanner MD, FACS Entered By: Evlyn Kanner on 08/19/2016 15:32:39 [name], [name] ([id number]) -------------------------------------------------------------------------------- Physician Orders Details Patient Name: [name] Date of Service: 08/19/2016 3:00 PM Medical Record Number: [id number] Patient Account Number:  [id number] Date of Birth/Sex: 1961/10/10 (55 y.o. Male) Treating RN: [name] Mealy, RN, BSN, [place] Sink Primary Care Physician: Corky Downs Other Clinician: Referring Physician: Corky Downs Treating Physician/Extender: Rudene Re in Treatment: 10 Verbal / Phone Orders: Yes Clinician: Afful, RN, BSN, Rita Read Back and Verified: No Diagnosis Coding Wound Cleansing Wound #5 Left,Anterior Lower Leg o Cleanse wound with mild soap and water o May Shower, gently pat wound dry prior to applying new dressing. Wound #6 Left,Proximal Lower Leg o Cleanse wound with mild soap and water o May Shower, gently pat wound dry prior to applying new dressing. Anesthetic Wound #5 Left,Anterior Lower Leg o Topical Lidocaine 4% cream applied to wound bed prior to debridement - for clinic use Wound #6 Left,Proximal Lower Leg o Topical Lidocaine 4% cream applied to wound bed prior to debridement - for clinic use Skin Barriers/Peri-Wound Care Wound #5 Left,Anterior Lower Leg o Moisturizing lotion Wound #6 Left,Proximal Lower Leg o Moisturizing lotion Primary Wound Dressing Wound #5 Left,Anterior Lower Leg o Other: - tritec silver foam Wound #6 Left,Proximal Lower Leg o Other: - tritec silver foam Secondary Dressing Wound #5 Left,Anterior Lower Leg o Dry Gauze o Conform/Kerlix Wound #6 Left,Proximal Lower Leg Roma, Kaiel J. ([id number]) o Dry Gauze o Conform/Kerlix Dressing Change Frequency Wound #5 Left,Anterior Lower Leg o Change dressing every other day. Wound #6 Left,Proximal Lower Leg o Change dressing every other day. Follow-up Appointments Wound #5 Left,Anterior Lower Leg o Return Appointment in 1 week. Wound #6 Left,Proximal Lower Leg o Return Appointment in 1 week. Edema Control Wound #5 Left,Anterior Lower Leg o Patient to wear own compression stockings o Patient to wear own Juxtalite/Juzo compression garment. o Elevate legs to the  level of the heart and pump ankles as often as possible o Support Garment 20-30 mm/Hg pressure to: - Juxtalites Wound #6 Left,Proximal Lower Leg o Patient to wear own compression stockings o Patient to wear own Juxtalite/Juzo compression garment. o Elevate legs to the level of the heart and pump ankles as often as possible o Support Garment 20-30 mm/Hg pressure to: - Juxtalites Additional Orders / Instructions Wound #5 Left,Anterior Lower Leg o Stop Smoking o Increase protein intake. Wound #  6 Left,Proximal Lower Leg o Stop Smoking o Increase protein intake. Electronic Signature(s) Signed: 08/19/2016 4:26:59 PM By: Evlyn Kanner MD, FACS Signed: 08/19/2016 5:34:49 PM By: Alejandro Mulling Entered By: Alejandro Mulling on 08/19/2016 15:51:41 [name], [name] ([id number]) -------------------------------------------------------------------------------- Problem List Details Patient Name: [name] Date of Service: 08/19/2016 3:00 PM Medical Record Number: [id number] Patient Account Number: [id number] Date of Birth/Sex: 1961/11/29 (55 y.o. 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.032 Postthrombotic syndrome with ulcer and inflammation of 04/23/2016 Yes left lower extremity F17.218 Nicotine dependence, cigarettes, with other nicotine- 04/23/2016 Yes induced disorders Inactive Problems Resolved Problems Electronic Signature(s) Signed: 08/19/2016 3:31:53 PM By: Evlyn Kanner MD, FACS Entered By: Evlyn Kanner on 08/19/2016 15:31:53 [name], [name]  ([id number]) -------------------------------------------------------------------------------- Progress Note Details Patient Name: [name] Date of Service: 08/19/2016 3:00 PM Medical Record Number: [id number] Patient Account Number: [id number] Date of Birth/Sex: 02-Jul-1961 (55 y.o. Male) Treating RN: Phillis Haggis Primary Care Physician: Corky Downs Other Clinician: Referring Physician: Corky Downs Treating Physician/Extender: Rudene Re in Treatment: 16 Subjective Chief Complaint Information obtained from Patient Patient presents to the wound care center for a consult due non healing wound to both lower extremities and accompanied by swelling and this has been worse for the last 5 months. He says the swelling of both lower extremity has been there for at least 6-7 years. History of Present Illness (HPI) The following HPI elements were documented for the patient's wound: Location: ulceration and weeping of both lower extremities left more than the right Quality: Patient reports experiencing heaviness to affected area(s). Severity: Patient states wound are getting worse. Duration: Patient has had the wound for > 5 months prior to seeking treatment at the wound center Timing: Pain in wound is Intermittent (comes and goes Context: The wound would happen gradually Modifying Factors: Other treatment(s) tried include:has been having weekly wraps applied to both lower extremities at Dr. Zannie Kehr office Associated Signs and Symptoms: Patient reports having increase swelling. 55 year old gentleman who has been treated in the past for venous ulcers of the lower extremity is also known to be a smoker for the last 20 years and smokes about a pack of cigarettes a day. He has been seen by Dr. Evette Cristal who has been treating left lower extremity venous ulcer with an Unna boot and have consulted vascular surgery to be seen by Dr. Gilda Crease. the patient was also recently admitted to  the hospital on 04/17/2016 and discharged on 04/19/2016 with bleeding, hyponatremia, cellulitis of the right leg, laceration of the right foot. 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 He was admitted to hospital with significant bleeding from the ulcer and initially started on IV antibiotics for underlying cellulitis which was suspected. Patient was seen by Dr. Evie Lacks of surgery and Dr Alberteen Spindle of podiatry. Dr. Graciela Husbands had done excisional debridement of some of the superficial skin slough as well as the ulcerative area on the posterior aspect of the left ankle region. Unna's boots was applied. He was asked to follow-up at the wound center. He is on Xarelto for his chronic atrial fibrillation. 04/30/2016 -- the patient was seen by the PA at the vein and vascular practice  who did a consultation but did not have any investigations ordered or done. The patient continues to have a lot of oozing from his wounds. 06/04/2016 -- he has seen his PCP who put him on Lasix 40 mg daily along with potassium. [name], [name] ([id number]) Objective Constitutional Pulse regular. Respirations normal and unlabored. Afebrile. Vitals Time Taken: 3:05 PM, Height: 76 in, Weight: 320 lbs, BMI: 38.9, Temperature: 97.7 F, Pulse: 79 bpm, Respiratory Rate: 20 breaths/min, Blood Pressure: 139/97 mmHg. Eyes Nonicteric. Reactive to light. Ears, Nose, Mouth, and Throat Lips, teeth, and gums WNL.Marland Kitchen Moist mucosa without lesions. Neck supple and nontender. No palpable supraclavicular or cervical adenopathy. Normal sized without goiter. Respiratory WNL. No retractions.. Breath sounds WNL, No rubs, rales, rhonchi, or wheeze.. Cardiovascular Heart rhythm and rate regular, no murmur or gallop.. Pedal Pulses WNL. No clubbing, cyanosis or edema. Chest Breasts symmetical and no nipple discharge.. Breast tissue WNL, no masses, lumps, or  tenderness.. Lymphatic No adneopathy. No adenopathy. No adenopathy. Musculoskeletal Adexa without tenderness or enlargement.. Digits and nails w/o clubbing, cyanosis, infection, petechiae, ischemia, or inflammatory conditions.Marland Kitchen Psychiatric Judgement and insight Intact.. No evidence of depression, anxiety, or agitation.. General Notes: the left lower extremity has 2 open areas which still have superficial ulcers but overall it is looking excellent. Integumentary (Hair, Skin) No suspicious lesions. No crepitus or fluctuance. No peri-wound warmth or erythema. No masses.Marland Kitchen Demello, BRAXLEY BALANDRAN ([id number]) Wound #5 status is Open. Original cause of wound was Gradually Appeared. The wound is located on the Left,Anterior Lower Leg. The wound measures 0.2cm length x 0.2cm width x 0.2cm depth; 0.031cm^2 area and 0.006cm^3 volume. The wound is limited to skin breakdown. There is no tunneling or undermining noted. There is a none present amount of drainage noted. The wound margin is flat and intact. There is no granulation within the wound bed. There is a large (67-100%) amount of necrotic tissue within the wound bed including Adherent Slough. The periwound skin appearance exhibited: Excoriation, Moist, Erythema. The periwound skin appearance did not exhibit: Callus, Crepitus, Fluctuance, Friable, Induration, Localized Edema, Rash, Scarring, Dry/Scaly, Maceration, Atrophie Blanche, Cyanosis, Ecchymosis, Hemosiderin Staining, Mottled, Pallor, Rubor. The surrounding wound skin color is noted with erythema which is circumferential. Periwound temperature was noted as No Abnormality. The periwound has tenderness on palpation. Wound #6 status is Open. Original cause of wound was Gradually Appeared. The wound is located on the Left,Proximal Lower Leg. The wound measures 0.3cm length x 0.3cm width x 0.1cm depth; 0.071cm^2 area and 0.007cm^3 volume. The wound is limited to skin breakdown. There is no tunneling  or undermining noted. There is a medium amount of serous drainage noted. The wound margin is distinct with the outline attached to the wound base. There is large (67-100%) red granulation within the wound bed. There is no necrotic tissue within the wound bed. The periwound skin appearance exhibited: Moist. Periwound temperature was noted as No Abnormality. The periwound has tenderness on palpation. 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 Wound #6 Left,Proximal Lower Leg: Cleanse wound with mild soap and water May Shower, gently pat wound dry prior to applying new dressing. Anesthetic: Wound #5  Left,Anterior Lower Leg: Topical Lidocaine 4% cream applied to wound bed prior to debridement - for clinic use Wonder, YAHIA BOTTGER ([id number]) Wound #6 Left,Proximal Lower Leg: Topical Lidocaine 4% cream applied to wound bed prior to debridement - for clinic use Skin Barriers/Peri-Wound Care: Wound #5 Left,Anterior Lower Leg: Moisturizing lotion Wound #6 Left,Proximal Lower Leg: Moisturizing lotion Primary Wound Dressing: Wound #5 Left,Anterior Lower Leg: Other: - tritec silver foam Wound #6 Left,Proximal Lower Leg: Other: - tritec silver foam Secondary Dressing: Wound #5 Left,Anterior Lower Leg: Dry Gauze Conform/Kerlix Wound #6 Left,Proximal Lower Leg: Dry Gauze Conform/Kerlix Dressing Change Frequency: Wound #5 Left,Anterior Lower Leg: Change dressing every other day. Wound #6 Left,Proximal Lower Leg: Change dressing every other  day. Follow-up Appointments: Wound #5 Left,Anterior Lower Leg: Return Appointment in 1 week. Wound #6 Left,Proximal Lower Leg: Return Appointment in 1 week. Edema Control: Wound #5 Left,Anterior Lower Leg: Patient to wear own compression stockings Patient to wear own Juxtalite/Juzo compression garment. Elevate legs to the level of the heart and pump ankles as often as possible Support Garment 20-30 mm/Hg pressure to: - Juxtalites Wound #6 Left,Proximal Lower Leg: Patient to wear own compression stockings Patient to wear own Juxtalite/Juzo compression garment. Elevate legs to the level of the heart and pump ankles as often as possible Support Garment 20-30 mm/Hg pressure to: - Juxtalites Additional Orders / Instructions: Wound #5 Left,Anterior Lower Leg: Stop Smoking Increase protein intake. Wound #6 Left,Proximal Lower Leg: Stop Smoking Increase protein intake. Godsey, [name] ([id number]) I have recommended we use Tritec foam and a light dressing to hold this in place. He will then use his juxta light compression stockings and make sure he elevates his limbs as much as possible. Electronic Signature(s) Signed: 08/19/2016 4:31:46 PM By: Evlyn Kanner MD, FACS Previous Signature: 08/19/2016 3:33:10 PM Version By: Evlyn Kanner MD, FACS Entered By: Evlyn Kanner on 08/19/2016 16:31:46 Corpus, [name] ([id number]) -------------------------------------------------------------------------------- SuperBill Details Patient Name: [name] Date of Service: 08/19/2016 Medical Record Number: [id number] Patient Account Number: [id number] Date of Birth/Sex: [birth year] (55 y.o. 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 I87.032 Postthrombotic syndrome with ulcer and inflammation of left lower extremity F17.218 Nicotine dependence, cigarettes, with other nicotine-induced disorders Facility Procedures CPT4 Code: [id number] Description: 66440 - WOUND CARE VISIT-LEV 2 EST PT Modifier: Quantity: 1 Physician Procedures CPT4: Description Modifier Quantity Code [id number] 99213 - WC PHYS LEVEL 3 - EST PT 1 ICD-10 Description Diagnosis I89.0 Lymphedema, not elsewhere classified 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

## 2016-08-20 NOTE — Progress Notes (Signed)
[name] ([id number]) Visit Report for 08/19/2016 Arrival Information Details Patient Name: [name], [name] Date of Service: 08/19/2016 3:00 PM Medical Record Number: [id number] Patient Account Number: [id number] Date of Birth/Sex: 09-Nov-1961 (55 y.o. Male) Treating RN: [name] Primary Care Physician: Corky Downs Other Clinician: Referring Physician: Corky Downs Treating Physician/Extender: [name] in Treatment: 16 Visit Information History Since Last Visit All ordered tests and consults were completed: No Patient Arrived: Ambulatory Added or deleted any medications: No Arrival Time: 15:04 Any new allergies or adverse reactions: No Accompanied By: self Had a fall or experienced change in No Transfer Assistance: None activities of daily living that may affect Patient Requires Transmission- No risk of falls: Based Precautions: Signs or symptoms of abuse/neglect since last No Patient Has Alerts: Yes visito Patient Alerts: Patient on Blood Hospitalized since last visit: No Thinner Pain Present 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 Male) Treating RN: [name] Primary Care Physician: Corky Downs Other Clinician: Referring Physician: Corky Downs Treating Physician/Extender: [name] in Treatment: 16 Clinic Level of Care Assessment Items TOOL 4 Quantity Score []  - Use when only an EandM is performed on FOLLOW-UP visit 0 ASSESSMENTS - Nursing Assessment / Reassessment X - Reassessment of  Co-morbidities (includes updates in patient status) 1 10 X - Reassessment of Adherence to Treatment Plan 1 5 ASSESSMENTS - Wound and Skin Assessment / Reassessment X - Simple Wound Assessment / Reassessment - one wound 1 5 []  - Complex Wound Assessment / Reassessment - multiple wounds 0 []  - Dermatologic / Skin Assessment (not related to wound area) 0 ASSESSMENTS - Focused Assessment []  - Circumferential Edema Measurements - multi extremities 0 []  - Nutritional Assessment / Counseling / Intervention 0 X - Lower Extremity Assessment (monofilament, tuning fork, pulses) 1 5 []  - Peripheral Arterial Disease Assessment (using hand held doppler) 0 ASSESSMENTS - Ostomy and/or Continence Assessment and Care []  - Incontinence Assessment and Management 0 []  - Ostomy Care Assessment and Management (repouching, etc.) 0 PROCESS - Coordination of Care X - Simple Patient / Family Education for ongoing care 1 15 []  - Complex (extensive) Patient / Family Education for ongoing care 0 []  - Staff obtains Chiropractor, Records, Test Results / Process Orders 0 []  - Staff telephones HHA, Nursing Homes / Clarify orders / etc 0 []  - Routine Transfer to another Facility (non-emergent condition) 0 [name], [name] J. ([id number]) []  - Routine Hospital Admission (non-emergent condition) 0 []  - New Admissions / Manufacturing engineer / Ordering NPWT, Apligraf, etc. 0 []  - Emergency Hospital Admission (emergent condition) 0 []  - Simple Discharge Coordination 0 []  - Complex (extensive) Discharge Coordination 0 PROCESS - Special Needs []  - Pediatric / Minor Patient Management 0 []  - Isolation Patient Management 0 []  - Hearing / Language / Visual special needs 0 []  - Assessment of Community assistance (transportation, D/C planning, etc.) 0 []  - Additional assistance / Altered mentation 0 []  - Support Surface(s) Assessment (bed, cushion, seat, etc.) 0 INTERVENTIONS - Wound Cleansing / Measurement X - Simple Wound  Cleansing - one wound 1 5 []  - Complex Wound Cleansing - multiple wounds 0 X - Wound Imaging (photographs - any number of wounds) 1 5 []  -  Wound Tracing (instead of photographs) 0 X - Simple Wound Measurement - one wound 1 5 []  - Complex Wound Measurement - multiple wounds 0 INTERVENTIONS - Wound Dressings X - Small Wound Dressing one or multiple wounds 1 10 []  - Medium Wound Dressing one or multiple wounds 0 []  - Large Wound Dressing one or multiple wounds 0 []  - Application of Medications - topical 0 []  - Application of Medications - injection 0 INTERVENTIONS - Miscellaneous []  - External ear exam 0 [name], [name] J. ([id number]) []  - Specimen Collection (cultures, biopsies, blood, body fluids, etc.) 0 []  - Specimen(s) / Culture(s) sent or taken to Lab for analysis 0 []  - Patient Transfer (multiple staff / Michiel SitesHoyer Lift / Similar devices) 0 []  - Simple Staple / Suture removal (25 or less) 0 []  - Complex Staple / Suture removal (26 or more) 0 []  - Hypo / Hyperglycemic Management (close monitor of Blood Glucose) 0 []  - Ankle / Brachial Index (ABI) - do not check if billed separately 0 X - Vital Signs 1 5 Has the patient been seen at the hospital within the last three years: Yes Total Score: 70 Level Of Care: New/Established - Level 2 Electronic Signature(s) Signed: 08/19/2016 5:31:48 PM By: Elpidio EricAfful, [name] BSN, RN Entered By: Elpidio EricAfful, [name] on 08/19/2016 16:01:24 [name], [name] ([id number]) -------------------------------------------------------------------------------- Encounter Discharge Information Details Patient Name: [name] RouteAMERON, [name] J. Date of Service: 08/19/2016 3:00 PM Medical Record Number: [id number] Patient Account Number: [id number] Date of Birth/Sex: September 11, 1961 [age] y.o. Male) Treating RN: [name] HaggisPinkerton, Debi Primary Care Physician: Corky DownsMASOUD, [name] Other Clinician: Referring Physician: Corky DownsMASOUD, [name] Treating Physician/Extender: [name] ReBritto, Errol Weeks in Treatment: 16 Encounter  Discharge Information Items Discharge Pain Level: 0 Discharge Condition: Stable Ambulatory Status: Ambulatory Discharge Destination: Home Transportation: Private Auto Accompanied By: self Schedule Follow-up Appointment: Yes Medication Reconciliation completed and provided to Patient/Care Yes Harmon Bommarito: Provided on Clinical Summary of Care: 08/19/2016 Form Type Recipient Paper Patient MD Electronic Signature(s) Signed: 08/19/2016 5:34:49 PM By: [name] MullingPinkerton, Debra Previous Signature: 08/19/2016 3:40:43 PM Version By: Gwenlyn PerkingMoore, Shelia Entered By: [name] MullingPinkerton, Debra on 08/19/2016 15:52:27 Teschner, [name] ([id number]) -------------------------------------------------------------------------------- Lower Extremity Assessment Details Patient Name: [name] RouteAMERON, [name] J. Date of Service: 08/19/2016 3:00 PM Medical Record Number: [id number] Patient Account Number: [id number] Date of Birth/Sex: September 11, 1961 108(55 y.o. Male) Treating RN: [name] HaggisPinkerton, Debi Primary Care Physician: Corky DownsMASOUD, [name] Other Clinician: Referring Physician: Corky DownsMASOUD, [name] Treating Physician/Extender: [name] ReBritto, Errol Weeks in Treatment: 16 Edema Assessment Assessed: [Left: No] [Right: No] E[Left: dema] [Right: :] Calf Left: Right: Point of Measurement: 38 cm From Medial Instep 48.4 cm cm Ankle Left: Right: Point of Measurement: 13 cm From Medial Instep 27.4 cm cm Vascular Assessment Pulses: Posterior Tibial Dorsalis Pedis Palpable: [Left:Yes] Extremity colors, hair growth, and conditions: Extremity Color: [Left:Hyperpigmented] Temperature of Extremity: [Left:Warm] Capillary Refill: [Left:< 3 seconds] Toe Nail Assessment Left: Right: Thick: Yes Discolored: Yes Deformed: No Improper Length and Hygiene: Yes Electronic Signature(s) Signed: 08/19/2016 5:34:49 PM By: [name] MullingPinkerton, Debra Entered By: [name] MullingPinkerton, Debra on 08/19/2016 15:12:38 Brendel, [name]  ([id number]) -------------------------------------------------------------------------------- Multi Wound Chart Details Patient Name: [name] RouteAMERON, [name] J. Date of Service: 08/19/2016 3:00 PM Medical Record Number: [id number] Patient Account Number: [id number] Date of Birth/Sex: September 11, 1961 11(55 y.o. Male) Treating RN: [name] MealyAfful, RN, BSN, [name] Primary Care Physician: Corky DownsMASOUD, [name] Other Clinician: Referring Physician: Corky DownsMASOUD, [name] Treating Physician/Extender: [name] ReBritto, Errol Weeks in Treatment: 16 Vital Signs Height(in): 76 Pulse(bpm): 79 Weight(lbs): 320 Blood Pressure 139/97 (mmHg): Body Mass Index(BMI): 39 Temperature(F): 97.7 Respiratory Rate 20 (breaths/min): Photos: [5:No Photos] [N/A:N/A] Wound  Location: [5:Left Lower Leg - Anterior] [N/A:N/A] Wounding Event: [5:Gradually Appeared] [N/A:N/A] Primary Etiology: [5:Lymphedema] [N/A:N/A] Comorbid History: [5:Arrhythmia, Hypertension] [N/A:N/A] Date Acquired: [5:07/04/2016] [N/A:N/A] Weeks of Treatment: [5:5] [N/A:N/A] Wound Status: [5:Open] [N/A:N/A] Measurements L x W x D 0.1x0.1x0.1 [N/A:N/A] (cm) Area (cm) : [5:0.008] [N/A:N/A] Volume (cm) : [5:0.001] [N/A:N/A] % Reduction in Area: [5:95.90%] [N/A:N/A] % Reduction in Volume: 95.00% [N/A:N/A] Classification: [5:Partial Thickness] [N/A:N/A] Exudate Amount: [5:None Present] [N/A:N/A] Wound Margin: [5:Flat and Intact] [N/A:N/A] Granulation Amount: [5:None Present (0%)] [N/A:N/A] Necrotic Amount: [5:Large (67-100%)] [N/A:N/A] Exposed Structures: [5:Fascia: No Fat: No Tendon: No Muscle: No Joint: No Bone: No Limited to Skin Breakdown] [N/A:N/A] Epithelialization: [5:Large (67-100%)] [N/A:N/A] Periwound Skin Texture: Excoriation: Yes [5:Edema: No] [N/A:N/A] Induration: No Callus: No Crepitus: No Fluctuance: No Friable: No Rash: No Scarring: No Periwound Skin Moist: Yes N/A N/A Moisture: Maceration: No Dry/Scaly: No Periwound Skin Color: Erythema: Yes N/A N/A Atrophie  Blanche: No Cyanosis: No Ecchymosis: No Hemosiderin Staining: No Mottled: No Pallor: No Rubor: No Erythema Location: Circumferential N/A N/A Temperature: No Abnormality N/A N/A Tenderness on Yes N/A N/A Palpation: Wound Preparation: Ulcer Cleansing: N/A N/A Rinsed/Irrigated with Saline Topical Anesthetic Applied: Other: lidocaine 4% Treatment Notes Electronic Signature(s) Signed: 08/19/2016 5:31:48 PM By: Elpidio Eric BSN, RN Entered By: Elpidio Eric on 08/19/2016 15:25:01 [name], [name] ([id number]) -------------------------------------------------------------------------------- Multi-Disciplinary Care Plan Details Patient Name: [name] Date of Service: 08/19/2016 3:00 PM Medical Record Number: [id number] Patient Account Number: [id number] Date of Birth/Sex: 1961-07-21 (55 y.o. Male) Treating RN: [name] Mealy, RN, BSN, [place] Sink Primary Care Physician: Corky Downs Other Clinician: Referring Physician: Corky Downs Treating Physician/Extender: [name] in Treatment: 16 Active Inactive Orientation to the Wound Care Program Nursing Diagnoses: Knowledge deficit related to the wound healing center program Goals: Patient/caregiver will verbalize understanding of the Wound Healing Center Program Date Initiated: 04/23/2016 Goal Status: Active Interventions: Provide education on orientation to the wound center Notes: Pain, Acute or Chronic Nursing Diagnoses: Pain, acute or chronic: actual or potential Potential alteration in comfort, pain Goals: Patient will verbalize adequate pain control and receive pain control interventions during procedures as needed Date Initiated: 04/23/2016 Goal Status: Active Patient/caregiver will verbalize adequate pain control between visits Date Initiated: 04/23/2016 Goal Status: Active Interventions: Assess comfort goal upon admission Complete pain assessment as per visit requirements Notes: Wound/Skin Impairment [place],  [place] Shela [place] ([id number]) Nursing Diagnoses: Impaired tissue integrity Knowledge deficit related to smoking impact on wound healing Knowledge deficit related to ulceration/compromised skin integrity Goals: Ulcer/skin breakdown will have a volume reduction of 30% by week 4 Date Initiated: 04/23/2016 Goal Status: Active Ulcer/skin breakdown will have a volume reduction of 50% by week 8 Date Initiated: 04/23/2016 Goal Status: Active Ulcer/skin breakdown will have a volume reduction of 80% by week 12 Date Initiated: 04/23/2016 Goal Status: Active Interventions: Assess ulceration(s) every visit Notes: Electronic Signature(s) Signed: 08/19/2016 5:31:48 PM By: Elpidio Eric BSN, RN Entered By: Elpidio Eric on 08/19/2016 15:24:43 [name], [name] ([id number]) -------------------------------------------------------------------------------- Pain Assessment Details Patient Name: [name] Date of Service: 08/19/2016 3:00 PM Medical Record Number: [id number] Patient Account Number: [id number] Date of Birth/Sex: February 09, 1961 (55 y.o. Male) Treating RN: [name] Primary Care Physician: Corky Downs Other Clinician: Referring Physician: Corky Downs Treating Physician/Extender: [name] in Treatment: 16 Active Problems Location of Pain Severity and Description of Pain Patient Has Paino No Site Locations With Dressing Change: No Pain Management and Medication Current Pain Management: Electronic Signature(s) Signed: 08/19/2016 5:34:49 PM By: [name] Entered By: [name]  on 08/19/2016 15:05:50 Silguero, [name] ([id number]) -------------------------------------------------------------------------------- Patient/Caregiver Education Details Patient Name: [name] Date of Service: 08/19/2016 3:00 PM Medical Record Number: [id number] Patient Account Number: [id number] Date of Birth/Gender: October 30, 1961 (55 y.o. Male) Treating RN: [name] Primary Care Physician: Corky Downs Other Clinician: Referring Physician: Corky Downs Treating Physician/Extender: [name] in Treatment: 16 Education Assessment Education Provided To: Patient Education Topics Provided Wound/Skin Impairment: Handouts: Other: change dressings as directed Methods: Demonstration, Explain/Verbal Responses: State content correctly Electronic Signature(s) Signed: 08/19/2016 5:34:49 PM By: [name] Entered By: [name] on 08/19/2016 15:52:35 [name], [name] ([id number]) -------------------------------------------------------------------------------- Wound Assessment Details Patient Name: [name] Date of Service: 08/19/2016 3:00 PM Medical Record Number: [id number] Patient Account Number: [id number] Date of Birth/Sex: 10-13-1961 (55 y.o. Male) Treating RN: [name] Primary Care Physician: Corky Downs Other Clinician: Referring Physician: Corky Downs Treating Physician/Extender: [name] in Treatment: 16 Wound Status Wound Number: 5 Primary Etiology: Lymphedema Wound Location: Left, Anterior Lower Leg Wound Status: Open Wounding Event: Gradually Appeared Comorbid History: Arrhythmia, Hypertension Date Acquired: 07/04/2016 Weeks Of Treatment: 5 Clustered Wound: No Photos Photo Uploaded By: [name] on 08/19/2016 16:13:11 Wound Measurements Length: (cm) 0.2 Width: (cm) 0.2 Depth: (cm) 0.2 Area: (cm) 0.031 Volume: (cm) 0.006 % Reduction in Area: 84.2% % Reduction in Volume: 70% Epithelialization: Large (67-100%) Tunneling: No Undermining: No Wound Description Classification: Partial Thickness Foul Odor Afte Wound Margin: Flat and Intact Exudate Amount: None Present r Cleansing: No Wound Bed Granulation Amount: None Present (0%) Exposed Structure Necrotic Amount: Large (67-100%) Fascia Exposed: No Necrotic Quality: Adherent Slough Fat Layer Exposed:  No Tendon Exposed: No Muscle Exposed: No Joint Exposed: No [name], [name] J. ([id number]) Bone Exposed: No Limited to Skin Breakdown Periwound Skin Texture Texture Color No Abnormalities Noted: No No Abnormalities Noted: No Callus: No Atrophie Blanche: No Crepitus: No Cyanosis: No Excoriation: Yes Ecchymosis: No Fluctuance: No Erythema: Yes Friable: No Erythema Location: Circumferential Induration: No Hemosiderin Staining: No Localized Edema: No Mottled: No Rash: No Pallor: No Scarring: No Rubor: No Moisture Temperature / Pain No Abnormalities Noted: No Temperature: No Abnormality Dry / Scaly: No Tenderness on Palpation: Yes Maceration: No Moist: Yes Wound Preparation Ulcer Cleansing: Rinsed/Irrigated with Saline Topical Anesthetic Applied: Other: lidocaine 4%, Treatment Notes Wound #5 (Left, Anterior Lower Leg) 1. Cleansed with: Cleanse wound with antibacterial soap and water 2. Anesthetic Topical Lidocaine 4% cream to wound bed prior to debridement 3. Peri-wound Care: Moisturizing lotion 4. Dressing Applied: Other dressing (specify in notes) 5. Secondary Dressing Applied Kerlix/Conform 7. Secured with Tape Notes tritec silver Electronic Signature(s) Signed: 08/19/2016 5:34:49 PM By: [name] Entered By: [name] on 08/19/2016 15:49:24 [name], [name] ([id number]) [name], [name] ([id number]) -------------------------------------------------------------------------------- Wound Assessment Details Patient Name: [name] Date of Service: 08/19/2016 3:00 PM Medical Record Number: [id number] Patient Account Number: [id number] Date of Birth/Sex: June 03, 1961 (55 y.o. Male) Treating RN: [name] Primary Care Physician: Corky Downs Other Clinician: Referring Physician: Corky Downs Treating Physician/Extender: [name] in Treatment: 16 Wound Status Wound Number: 6 Primary Etiology: To be  determined Wound Location: Left Lower Leg - Proximal Wound Status: Open Wounding Event: Gradually Appeared Comorbid History: Arrhythmia, Hypertension Date Acquired: 08/19/2016 Weeks Of Treatment: 0 Clustered Wound: No Wound Measurements Length: (cm) 0.3 Width: (cm) 0.3 Depth: (cm) 0.1 Area: (cm) 0.071 Volume: (cm) 0.007 % Reduction in Area: % Reduction in Volume: Epithelialization: None Tunneling: No Undermining: No Wound Description Classification: Partial Thickness Wound Margin: Distinct,  outline attached Exudate Amount: Medium Exudate Type: Serous Exudate Color: amber Foul Odor After Cleansing: No Wound Bed Granulation Amount: Large (67-100%) Exposed Structure Granulation Quality: Red Fascia Exposed: No Necrotic Amount: None Present (0%) Fat Layer Exposed: No Tendon Exposed: No Muscle Exposed: No Joint Exposed: No Bone Exposed: No Limited to Skin Breakdown Periwound Skin Texture Texture Color No Abnormalities Noted: No No Abnormalities Noted: No Moisture Temperature / Pain No Abnormalities Noted: No Temperature: No Abnormality Moist: Yes Tenderness on Palpation: Yes Teresi, Amadeo J. ([id number]) Wound Preparation Ulcer Cleansing: Other: soap and water, Topical Anesthetic Applied: None Treatment Notes Wound #6 (Left, Proximal Lower Leg) 1. Cleansed with: Cleanse wound with antibacterial soap and water 2. Anesthetic Topical Lidocaine 4% cream to wound bed prior to debridement 3. Peri-wound Care: Moisturizing lotion 4. Dressing Applied: Other dressing (specify in notes) 5. Secondary Dressing Applied Kerlix/Conform 7. Secured with Tape Notes tritec silver Electronic Signature(s) Signed: 08/19/2016 5:34:49 PM By: [name] Entered By: [name] on 08/19/2016 15:51:00 Coker, [name] ([id number]) -------------------------------------------------------------------------------- Vitals Details Patient Name: [name] Date of Service: 08/19/2016 3:00 PM Medical Record Number: [id number] Patient Account Number: [id number] Date of Birth/Sex: [birth year] (55 y.o. Male) Treating RN: Ashok Cordia, Debi Primary Care Physician: Corky Downs Other Clinician: Referring Physician: Corky Downs Treating Physician/Extender: [name] in Treatment: 16 Vital Signs Time Taken: 15:05 Temperature (F): 97.7 Height (in): 76 Pulse (bpm): 79 Weight (lbs): 320 Respiratory Rate (breaths/min): 20 Body Mass Index (BMI): 38.9 Blood Pressure (mmHg): 139/97 Reference Range: 80 - 120 mg / dl Electronic Signature(s) Signed: 08/19/2016 5:34:49 PM By: [name] Entered By: [name] on 08/19/2016 15:07:20

## 2016-08-26 ENCOUNTER — Encounter: Payer: BC Managed Care – PPO | Admitting: Surgery

## 2016-08-26 DIAGNOSIS — I890 Lymphedema, not elsewhere classified: Secondary | ICD-10-CM | POA: Diagnosis not present

## 2016-08-27 NOTE — Progress Notes (Signed)
BENSEN, CHADDERDON ([id number]) Visit Report for 08/26/2016 Chief Complaint Document Details Patient Name: [name], [name] Date of Service: 08/26/2016 3:15 PM Medical Record Number: [id number] Patient Account Number: [id number] Date of Birth/Sex: May 15, 1961 (55 y.o. Male) Treating RN: Phillis Haggis Primary Care Physician: Corky Downs Other Clinician: Referring Physician: Corky Downs Treating Physician/Extender: Rudene Re in Treatment: 17 Information Obtained from: Patient Chief Complaint Patient presents to the wound care center for a consult due non healing wound to both lower extremities and accompanied by swelling and this has been worse for the last 5 months. He says the swelling of both lower extremity has been there for at least 6-7 years. Electronic Signature(s) Signed: 08/26/2016 3:40:14 PM By: Evlyn Kanner MD, FACS Entered By: Evlyn Kanner on 08/26/2016 15:40:14 Brunson, [name] ([id number]) -------------------------------------------------------------------------------- HPI Details Patient Name: [name] Date of Service: 08/26/2016 3:15 PM Medical Record Number: [id number] Patient Account Number: [id number] Date of Birth/Sex: [birth year] (55 y.o. Male) Treating RN: Phillis Haggis Primary Care Physician: Corky Downs Other Clinician: Referring Physician: Corky Downs Treating Physician/Extender: Rudene Re in Treatment: 17 History of Present Illness Location: ulceration and weeping of both lower extremities left more than the right Quality: Patient reports experiencing heaviness to affected area(s). Severity: Patient states wound are getting worse. Duration: Patient has had the wound for > 5 months prior to seeking treatment at the wound center Timing: Pain in wound is Intermittent (comes and goes Context: The wound would happen gradually Modifying Factors: Other treatment(s) tried include:has been having weekly wraps applied to  both lower extremities at Dr. Zannie Kehr office Associated Signs and Symptoms: Patient reports having increase swelling. HPI Description: 55 year old gentleman who has been treated in the past for venous ulcers of the lower extremity is also known to be a smoker for the last 20 years and smokes about a pack of cigarettes a day. He has been seen by Dr. Evette Cristal who has been treating left lower extremity venous ulcer with an Unna boot and have consulted vascular surgery to be seen by Dr. Gilda Crease. the patient was also recently admitted to the hospital on 04/17/2016 and discharged on 04/19/2016 with bleeding, hyponatremia, cellulitis of the right leg, laceration of the right foot. 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 He was admitted to hospital with significant bleeding from the ulcer and initially started on IV antibiotics for underlying cellulitis which was suspected. Patient was seen by Dr. Evie Lacks of surgery and Dr Alberteen Spindle of podiatry. Dr. Graciela Husbands had done excisional debridement of some of the superficial skin slough as well as the ulcerative area on the posterior aspect of the left ankle region. Unna's boots was applied. He was asked to follow-up at the wound center. He is on Xarelto for his chronic atrial fibrillation. 04/30/2016 -- the patient was seen by the PA at the vein and vascular practice who did a consultation but did not have any investigations ordered or done. The patient continues to have a lot of oozing from his wounds. 06/04/2016 -- he has seen his PCP who put him on Lasix 40 mg daily along with potassium. Electronic Signature(s) Signed: 08/26/2016 3:40:21 PM By: Evlyn Kanner MD, FACS Entered By: Evlyn Kanner on 08/26/2016 15:40:20 Fallin, [name] ([id number]) -------------------------------------------------------------------------------- Physical Exam Details Patient Name: [name] Date of Service: 08/26/2016 3:15 PM Medical Record Number: [id number] Patient Account Number: [id number] Date of Birth/Sex: 03-27-1961 (55 y.o. Male) Treating RN: Ashok Cordia, Debi Primary  Care Physician: Corky DownsMASOUD, [name] Other Clinician: Referring Physician: Corky DownsMASOUD, [name] Treating Physician/Extender: Rudene ReBritto, Ahuva Poynor Weeks in Treatment: 17 Constitutional . Pulse regular. Respirations normal and unlabored. Afebrile. . Eyes Nonicteric. Reactive to light. Ears, Nose, Mouth, and Throat Lips, teeth, and gums WNL.Marland Kitchen. Moist mucosa without lesions. Neck supple and nontender. No palpable supraclavicular or cervical adenopathy. Normal sized without goiter. Respiratory WNL. No retractions.. Cardiovascular Pedal Pulses WNL. No clubbing, cyanosis or edema. Lymphatic No adneopathy. No adenopathy. No adenopathy. Musculoskeletal Adexa without tenderness or enlargement.. Digits and nails w/o clubbing, cyanosis, infection, petechiae, ischemia, or inflammatory conditions.. Integumentary (Hair, Skin) No suspicious lesions. No crepitus or fluctuance. No peri-wound warmth or erythema. No masses.Marland Kitchen. Psychiatric Judgement and insight Intact.. No evidence of depression, anxiety, or agitation.. Notes the wound on the left lower extremity is completely healed and there are no open areas in the lymphedema is very well controlled. Electronic Signature(s) Signed: 08/26/2016 3:40:51 PM By: Evlyn KannerBritto, Laken Rog MD, FACS Entered By: Evlyn KannerBritto, Charly Holcomb on 08/26/2016 15:40:50 Benham, [name] ([id number]) -------------------------------------------------------------------------------- Physician Orders Details Patient Name: [name] RouteAMERON, Lester J. Date of Service: 08/26/2016 3:15 PM Medical Record Number: [id number] Patient Account Number: [id number] Date of Birth/Sex: 1961/08/16 34(55 y.o. Male) Treating RN: [name] MealyAfful, RN, BSN, [place] Primary Care Physician: Corky DownsMASOUD, [name] Other Clinician: Referring Physician: Corky DownsMASOUD, [name] Treating  Physician/Extender: Rudene ReBritto, Josh Nicolosi Weeks in Treatment: 5917 Verbal / Phone Orders: Yes Clinician: Afful, RN, BSN, Rita Read Back and Verified: Yes Diagnosis Coding [facility] Discharge from Wound Care Center - Treatment Completed. Continue to protect area with tritec foam for 1-2 weeks. Continue to wear compression stockings. Elevate your legs above heart. Electronic Signature(s) Signed: 08/26/2016 3:58:34 PM By: Evlyn KannerBritto, France Noyce MD, FACS Signed: 08/26/2016 5:04:09 PM By: Elpidio EricAfful, Rita BSN, RN Entered By: Elpidio EricAfful, Rita on 08/26/2016 15:34:59 Vanpelt, [name] ([id number]) -------------------------------------------------------------------------------- Problem List Details Patient Name: [name] RouteAMERON, Buddie J. Date of Service: 08/26/2016 3:15 PM Medical Record Number: [id number] Patient Account Number: [id number] Date of Birth/Sex: 1961/08/16 16(55 y.o. 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 ([id number]) -------------------------------------------------------------------------------- Progress Note Details Patient Name: [name] RouteAMERON, Cuinn J. Date of Service: 08/26/2016 3:15 PM Medical Record Number: [id number] Patient Account  Number: [id number] Date of Birth/Sex: 1961/08/16 45(55 y.o. Male) Treating RN: Phillis HaggisPinkerton, Debi Primary Care Physician: Corky DownsMASOUD, [name] Other Clinician: Referring Physician: Corky DownsMASOUD, [name] Treating Physician/Extender: Rudene ReBritto, Cirilo Canner Weeks in Treatment: 17 Subjective Chief Complaint Information obtained from Patient Patient presents to the wound care center for a consult due non healing wound to both lower extremities and accompanied by swelling and this has been worse for the last 5 months. He says the swelling of both lower extremity has been there for at least 6-7 years. History of Present Illness (HPI) The following HPI elements were documented for the patient's wound: Location: ulceration and weeping of both lower extremities left more than the right Quality: Patient reports experiencing heaviness to affected area(s). Severity: Patient states wound are getting worse. Duration: Patient has had the  wound for > 5 months prior to seeking treatment at the wound center Timing: Pain in wound is Intermittent (comes and goes Context: The wound would happen gradually Modifying Factors: Other treatment(s) tried include:has been having weekly wraps applied to both lower extremities at Dr. Zannie Kehr office Associated Signs and Symptoms: Patient reports having increase swelling. 55 year old gentleman who has been treated in the past for venous ulcers of the lower extremity is also known to be a smoker for the last 20 years and smokes about a pack of cigarettes a day. He has been seen by Dr. Evette Cristal who has been treating left lower extremity venous ulcer with an Unna boot and have consulted vascular surgery to be seen by Dr. Gilda Crease. the patient was also recently admitted to the hospital on 04/17/2016 and discharged on 04/19/2016 with bleeding, hyponatremia, cellulitis of the right leg, laceration of the right foot. 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 He was admitted to hospital with significant bleeding from the ulcer and initially started on IV antibiotics for underlying cellulitis which was suspected. Patient was seen by Dr. Evie Lacks of surgery and Dr Alberteen Spindle of podiatry. Dr. Graciela Husbands had done excisional debridement of some of the superficial skin slough as well as the ulcerative area on the posterior aspect of the left ankle region. Unna's boots was applied. He was asked to follow-up at the wound center. He is on Xarelto for his chronic atrial fibrillation. 04/30/2016 -- the patient was seen by the PA at the vein and vascular practice who did a consultation but did not have any investigations ordered or done. The patient continues to have a lot of oozing from his wounds. 06/04/2016 -- he has seen his PCP who put him on Lasix 40 mg daily along with potassium. Bahl, [name] ([id number]) Objective Constitutional Pulse regular. Respirations normal and unlabored. Afebrile. Vitals Time Taken: 3:16 PM, Height: 76 in, Weight: 320 lbs, BMI: 38.9, Temperature: 97.8 F, Pulse: 78 bpm, Respiratory Rate: 20 breaths/min, Blood Pressure: 121/78 mmHg. Eyes Nonicteric. Reactive to light. Ears, Nose, Mouth, and Throat Lips, teeth, and gums WNL.Marland Kitchen Moist mucosa without lesions. Neck supple and nontender. No palpable supraclavicular or cervical adenopathy. Normal sized without goiter. Respiratory WNL. No retractions.. Cardiovascular Pedal Pulses WNL. No clubbing, cyanosis or edema. Lymphatic No adneopathy. No adenopathy. No adenopathy. Musculoskeletal Adexa without tenderness or enlargement.. Digits and nails w/o clubbing, cyanosis, infection, petechiae, ischemia, or inflammatory conditions.Marland Kitchen Psychiatric Judgement and insight Intact.. No evidence of depression, anxiety, or agitation.. General Notes: the wound on the left lower extremity is completely healed and there are no open areas in the lymphedema  is very well controlled. Integumentary (Hair, Skin) No suspicious lesions. No crepitus or fluctuance. No peri-wound warmth or erythema. No masses.. Wound #5 status is Healed - Epithelialized. Original cause of wound was Gradually Appeared. The wound is located on the Left,Anterior Lower Leg. The wound measures 0cm length x 0cm width x 0cm depth; 0cm^2 area and 0cm^3 volume. The wound is limited to skin breakdown. There is no tunneling or Nichter, Charly J. ([id number]) undermining noted. There is a none present amount of drainage noted. The wound margin is flat and intact. There is no granulation within the wound bed. There is a large (67-100%) amount of necrotic tissue within the wound bed including Adherent Slough. The periwound skin appearance exhibited: Excoriation, Moist, Erythema. The periwound skin appearance did not exhibit: Callus, Crepitus, Fluctuance, Friable, Induration, Localized Edema, Rash,  Scarring, Dry/Scaly, Maceration, Atrophie Blanche, Cyanosis, Ecchymosis, Hemosiderin Staining, Mottled, Pallor, Rubor. The surrounding wound skin color is noted with erythema which is circumferential. Periwound temperature was noted as No Abnormality. The periwound has tenderness on palpation. Wound #6 status is Healed - Epithelialized. Original cause of wound was Gradually Appeared. The wound is located on the Left,Proximal Lower Leg. The wound measures 0cm length x 0cm width x 0cm depth; 0cm^2 area and 0cm^3 volume. The wound is limited to skin breakdown. There is no tunneling or undermining noted. There is a none present amount of drainage noted. The wound margin is distinct with the outline attached to the wound base. There is no granulation within the wound bed. There is a large (67- 100%) amount of necrotic tissue within the wound bed including Eschar. The periwound skin appearance exhibited: Moist. Periwound temperature was noted as No Abnormality. The periwound has tenderness  on palpation. 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 since has been placed on continuing to give up smoking, salt restriction, taking his diuretics and being compliant with wearing his compression stockings/wraps he is discharged from the wound care services and will be seen back as needed. Plan Discharge From [facility]: Discharge from Wound Care Center - Treatment Completed. Continue to protect area with tritec foam for 1-2 weeks. Continue to wear compression stockings. Elevate your legs above heart. Canning, ORYN CASANOVA ([id number]) His wound has completely healed and I have asked him to support this with some foam dressing and continue to wear his compression wraps on a regular basis. since has been placed on continuing to give up smoking, salt restriction, taking his diuretics and being compliant with wearing his compression stockings/wraps he is discharged from the wound care services and will be seen back as needed. Electronic Signature(s) Signed: 08/26/2016 3:42:44 PM By: Evlyn Kanner MD, FACS Entered By: Evlyn Kanner on 08/26/2016 15:42:44 Dreyfuss, [name] ([id number]) -------------------------------------------------------------------------------- SuperBill Details Patient Name: [name] Date of Service: 08/26/2016 Medical Record Number: [id number] Patient Account Number: [id number] Date of Birth/Sex: 1961-10-07 (55 y.o. 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 I87.032 Postthrombotic syndrome with ulcer and inflammation of left lower extremity F17.218 Nicotine dependence, cigarettes, with other nicotine-induced disorders Physician Procedures CPT4: Description Modifier Quantity Code [id number] - WC PHYS LEVEL 2 - EST PT 1 ICD-10 Description Diagnosis I89.0 Lymphedema, not elsewhere classified I87.032 Postthrombotic syndrome with ulcer and inflammation of left lower extremity I87.031  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

## 2016-08-28 NOTE — Progress Notes (Signed)
FITZHUGH, VIZCARRONDO ([id number]) Visit Report for 08/26/2016 Arrival Information Details Patient Name: [name], [name] Date of Service: 08/26/2016 3:15 PM Medical Record Number: [id number] Patient Account Number: [id number] Date of Birth/Sex: 27-Dec-1960 (55 y.o. Male) Treating RN: Phillis Haggis Primary Care Physician: Corky Downs Other Clinician: Referring Physician: Corky Downs Treating Physician/Extender: Rudene Re in Treatment: 17 Visit Information History Since Last Visit All ordered tests and consults were completed: No Patient Arrived: Ambulatory Added or deleted any medications: No Arrival Time: 15:14 Any new allergies or adverse reactions: No Accompanied By: self Had a fall or experienced change in No Transfer Assistance: None activities of daily living that may affect Patient Identification Verified: Yes risk of falls: Secondary Verification Process Yes Signs or symptoms of abuse/neglect since last No Completed: visito Patient Requires Transmission- No Hospitalized since last visit: No Based Precautions: Pain Present Now: No Patient Has Alerts: Yes Patient Alerts: Patient on Blood Thinner [name] Electronic Signature(s) Signed: 08/27/2016 4:43:58 PM By: Alejandro Mulling Entered By: Alejandro Mulling on 08/26/2016 15:15:33 [name], [name] ([id number]) -------------------------------------------------------------------------------- Clinic Level of Care Assessment Details Patient Name: [name] Date of Service: 08/26/2016 3:15 PM Medical Record Number: [id number] Patient Account Number: [id number] Date of Birth/Sex: 02-22-1961 (55 y.o. Male) Treating RN: [name] Mealy, RN, BSN, [name] Primary Care Physician: Corky Downs Other Clinician: Referring Physician: Corky Downs Treating Physician/Extender: Rudene Re in Treatment: 17 Clinic Level of Care Assessment Items TOOL 4 Quantity Score []  - Use when only an EandM is performed on  FOLLOW-UP visit 0 ASSESSMENTS - Nursing Assessment / Reassessment X - Reassessment of Co-morbidities (includes updates in patient status) 1 10 X - Reassessment of Adherence to Treatment Plan 1 5 ASSESSMENTS - Wound and Skin Assessment / Reassessment X - Simple Wound Assessment / Reassessment - one wound 1 5 []  - Complex Wound Assessment / Reassessment - multiple wounds 0 []  - Dermatologic / Skin Assessment (not related to wound area) 0 ASSESSMENTS - Focused Assessment []  - Circumferential Edema Measurements - multi extremities 0 []  - Nutritional Assessment / Counseling / Intervention 0 []  - Lower Extremity Assessment (monofilament, tuning fork, pulses) 0 []  - Peripheral Arterial Disease Assessment (using hand held doppler) 0 ASSESSMENTS - Ostomy and/or Continence Assessment and Care []  - Incontinence Assessment and Management 0 []  - Ostomy Care Assessment and Management (repouching, etc.) 0 PROCESS - Coordination of Care X - Simple Patient / Family Education for ongoing care 1 15 []  - Complex (extensive) Patient / Family Education for ongoing care 0 []  - Staff obtains Chiropractor, Records, Test Results / Process Orders 0 []  - Staff telephones HHA, Nursing Homes / Clarify orders / etc 0 []  - Routine Transfer to another Facility (non-emergent condition) 0 [name], [name] J. ([id number]) []  - Routine Hospital Admission (non-emergent condition) 0 []  - New Admissions / Manufacturing engineer / Ordering NPWT, Apligraf, etc. 0 []  - Emergency Hospital Admission (emergent condition) 0 []  - Simple Discharge Coordination 0 []  - Complex (extensive) Discharge Coordination 0 PROCESS - Special Needs []  - Pediatric / Minor Patient Management 0 []  - Isolation Patient Management 0 []  - Hearing / Language / Visual special needs 0 []  - Assessment of Community assistance (transportation, D/C planning, etc.) 0 []  - Additional assistance / Altered mentation 0 []  - Support Surface(s) Assessment (bed,  cushion, seat, etc.) 0 INTERVENTIONS - Wound Cleansing / Measurement []  - Simple Wound Cleansing - one wound 0 []  - Complex Wound Cleansing - multiple wounds 0 []  - Wound Imaging (photographs -  any number of wounds) 0 []  - Wound Tracing (instead of photographs) 0 []  - Simple Wound Measurement - one wound 0 []  - Complex Wound Measurement - multiple wounds 0 INTERVENTIONS - Wound Dressings []  - Small Wound Dressing one or multiple wounds 0 []  - Medium Wound Dressing one or multiple wounds 0 []  - Large Wound Dressing one or multiple wounds 0 []  - Application of Medications - topical 0 []  - Application of Medications - injection 0 INTERVENTIONS - Miscellaneous []  - External ear exam 0 [name], [name] J. ([id number]) []  - Specimen Collection (cultures, biopsies, blood, body fluids, etc.) 0 []  - Specimen(s) / Culture(s) sent or taken to Lab for analysis 0 []  - Patient Transfer (multiple staff / Michiel Sites Lift / Similar devices) 0 []  - Simple Staple / Suture removal (25 or less) 0 []  - Complex Staple / Suture removal (26 or more) 0 []  - Hypo / Hyperglycemic Management (close monitor of Blood Glucose) 0 []  - Ankle / Brachial Index (ABI) - do not check if billed separately 0 X - Vital Signs 1 5 Has the patient been seen at the hospital within the last three years: Yes Total Score: 40 Level Of Care: New/Established - Level 2 Electronic Signature(s) Signed: 08/27/2016 4:12:23 PM By: Elpidio Eric BSN, RN Entered By: Elpidio Eric on 08/27/2016 09:52:37 Helbling, [name] ([id number]) -------------------------------------------------------------------------------- Encounter Discharge Information Details Patient Name: [name] Date of Service: 08/26/2016 3:15 PM Medical Record Number: [id number] Patient Account Number: [id number] Date of Birth/Sex: [birth year] (56 y.o. Male) Treating RN: Phillis Haggis Primary Care Physician: Corky Downs Other Clinician: Referring Physician: Corky Downs Treating Physician/Extender: Rudene Re in Treatment: 17 Encounter Discharge Information Items Discharge Pain Level: 0 Discharge Condition: Stable Ambulatory Status: Ambulatory Discharge Destination: Home Transportation: Private Auto Accompanied By: self Schedule Follow-up Appointment: No Medication Reconciliation completed and provided to Patient/Care Yes Consetta Cosner: Provided on Clinical Summary of Care: 08/26/2016 Form Type Recipient Paper Patient MD Electronic Signature(s) Signed: 08/26/2016 3:43:59 PM By: Gwenlyn Perking Entered By: Gwenlyn Perking on 08/26/2016 15:43:59 [name], [name] ([id number]) -------------------------------------------------------------------------------- Lower Extremity Assessment Details Patient Name: [name] Date of Service: 08/26/2016 3:15 PM Medical Record Number: [id number] Patient Account Number: [id number] Date of Birth/Sex: 22-Aug-1961 (55 y.o. Male) Treating RN: Phillis Haggis Primary Care Physician: Corky Downs Other Clinician: Referring Physician: Corky Downs Treating Physician/Extender: Rudene Re in Treatment: 17 Vascular Assessment Pulses: Posterior Tibial Dorsalis Pedis Palpable: [Left:Yes] Extremity colors, hair growth, and conditions: Extremity Color: [Left:Hyperpigmented] Temperature of Extremity: [Left:Warm] Capillary Refill: [Left:< 3 seconds] Electronic Signature(s) Signed: 08/27/2016 4:43:58 PM By: Alejandro Mulling Entered By: Alejandro Mulling on 08/26/2016 15:20:21 [name], [name] ([id number]) -------------------------------------------------------------------------------- Multi Wound Chart Details Patient Name: [name] Date of Service: 08/26/2016 3:15 PM Medical Record Number: [id number] Patient Account Number: [id number] Date of Birth/Sex: 1961/05/14 (55 y.o. Male) Treating RN: [name] Mealy, RN, BSN, [name] Primary Care Physician: Corky Downs Other Clinician: Referring  Physician: Corky Downs Treating Physician/Extender: Rudene Re in Treatment: 17 Vital Signs Height(in): 76 Pulse(bpm): 78 Weight(lbs): 320 Blood Pressure 121/78 (mmHg): Body Mass Index(BMI): 39 Temperature(F): 97.8 Respiratory Rate 20 (breaths/min): Photos: [N/A:N/A] Wound Location: Left, Anterior Lower Leg Left, Proximal Lower Leg N/A Wounding Event: Gradually Appeared Gradually Appeared N/A Primary Etiology: Lymphedema Venous Leg Ulcer N/A Comorbid History: Arrhythmia, Hypertension Arrhythmia, Hypertension N/A Date Acquired: 07/04/2016 08/19/2016 N/A Weeks of Treatment: 6 1 N/A Wound Status: Healed - Epithelialized Healed - Epithelialized N/A Measurements L x W x D 0x0x0 0x0x0 N/A (cm)  Area (cm) : 0 0 N/A Volume (cm) : 0 0 N/A % Reduction in Area: 100.00% 100.00% N/A % Reduction in Volume: 100.00% 100.00% N/A Classification: Partial Thickness Partial Thickness N/A Exudate Amount: None Present None Present N/A Wound Margin: Flat and Intact Distinct, outline attached N/A Granulation Amount: None Present (0%) None Present (0%) N/A Necrotic Amount: Large (67-100%) Large (67-100%) N/A Necrotic Tissue: Adherent Slough Eschar N/A Exposed Structures: Fascia: No Fascia: No N/A Fat: No Fat: No Tendon: No Tendon: No Muscle: No Muscle: No [name], [name] J. ([id number]) Joint: No Joint: No Bone: No Bone: No Limited to Skin Limited to Skin Breakdown Breakdown Epithelialization: Large (67-100%) Medium (34-66%) N/A Periwound Skin Texture: Excoriation: Yes No Abnormalities Noted N/A Edema: No Induration: No Callus: No Crepitus: No Fluctuance: No Friable: No Rash: No Scarring: No Periwound Skin Moist: Yes Moist: Yes N/A Moisture: Maceration: No Dry/Scaly: No Periwound Skin Color: Erythema: Yes No Abnormalities Noted N/A Atrophie Blanche: No Cyanosis: No Ecchymosis: No Hemosiderin Staining: No Mottled: No Pallor: No Rubor: No Erythema Location:  Circumferential N/A N/A Temperature: No Abnormality No Abnormality N/A Tenderness on Yes Yes N/A Palpation: Wound Preparation: Ulcer Cleansing: Ulcer Cleansing: Other: N/A Rinsed/Irrigated with soap and water Saline Topical Anesthetic Topical Anesthetic Applied: None Applied: Other: lidocaine 4% Treatment Notes Electronic Signature(s) Signed: 08/27/2016 9:51:57 AM By: Elpidio Eric BSN, RN Entered By: Elpidio Eric on 08/27/2016 09:51:56 [name], [name] ([id number]) -------------------------------------------------------------------------------- Multi-Disciplinary Care Plan Details Patient Name: [name] Date of Service: 08/26/2016 3:15 PM Medical Record Number: [id number] Patient Account Number: [id number] Date of Birth/Sex: [birth year] (55 y.o. Male) Treating RN: [name] Mealy, RN, BSN, [place] Primary Care Physician: Corky Downs Other Clinician: Referring Physician: Corky Downs Treating Physician/Extender: Rudene Re in Treatment: 17 Active Inactive Electronic Signature(s) Signed: 08/26/2016 5:04:09 PM By: Elpidio Eric BSN, RN Entered By: Elpidio Eric on 08/26/2016 15:31:26 [name], [name] ([id number]) -------------------------------------------------------------------------------- Pain Assessment Details Patient Name: [name] Date of Service: 08/26/2016 3:15 PM Medical Record Number: [id number] Patient Account Number: [id number] Date of Birth/Sex: [birth year] (55 y.o. Male) Treating RN: Phillis Haggis Primary Care Physician: Corky Downs Other Clinician: Referring Physician: Corky Downs Treating Physician/Extender: Rudene Re in Treatment: 17 Active Problems Location of Pain Severity and Description of Pain Patient Has Paino No Site Locations With Dressing Change: No Pain Management and Medication Current Pain Management: Electronic Signature(s) Signed: 08/27/2016 4:43:58 PM By: Alejandro Mulling Entered By: Alejandro Mulling on  08/26/2016 15:16:05 [name], [name] ([id number]) -------------------------------------------------------------------------------- Patient/Caregiver Education Details Patient Name: [name] Date of Service: 08/26/2016 3:15 PM Medical Record Number: [id number] Patient Account Number: [id number] Date of Birth/Gender: [birth year] (55 y.o. Male) Treating RN: Phillis Haggis Primary Care Physician: Corky Downs Other Clinician: Referring Physician: Corky Downs Treating Physician/Extender: Rudene Re in Treatment: 17 Education Assessment Education Provided To: Patient Education Topics Provided Wound/Skin Impairment: Handouts: Other: Please contact our office if you have any questions or concerns. Methods: Demonstration, Explain/Verbal Responses: State content correctly Electronic Signature(s) Signed: 08/27/2016 4:43:58 PM By: Alejandro Mulling Entered By: Alejandro Mulling on 08/26/2016 15:42:43 [name], [name] ([id number]) -------------------------------------------------------------------------------- Wound Assessment Details Patient Name: [name] Date of Service: 08/26/2016 3:15 PM Medical Record Number: [id number] Patient Account Number: [id number] Date of Birth/Sex: May 20, 1961 (55 y.o. Male) Treating RN: [name] Mealy, RN, BSN, [place] Primary Care Physician: Corky Downs Other Clinician: Referring Physician: Corky Downs Treating Physician/Extender: Rudene Re in Treatment: 17 Wound Status Wound Number: 5 Primary Etiology: Lymphedema Wound Location: Left, Anterior Lower Leg Wound Status: Healed -  Epithelialized Wounding Event: Gradually Appeared Comorbid History: Arrhythmia, Hypertension Date Acquired: 07/04/2016 Weeks Of Treatment: 6 Clustered Wound: No Photos Photo Uploaded By: Alejandro MullingPinkerton, Debra on 08/26/2016 15:47:39 Wound Measurements Length: (cm) 0 % Reduction in Width: (cm) 0 % Reduction in Depth: (cm) 0 Epithelializat Area:  (cm) 0 Tunneling: Volume: (cm) 0 Undermining: Area: 100% Volume: 100% ion: Large (67-100%) No No Wound Description Classification: Partial Thickness Foul Odor Afte Wound Margin: Flat and Intact Exudate Amount: None Present r Cleansing: No Wound Bed Granulation Amount: None Present (0%) Exposed Structure Necrotic Amount: Large (67-100%) Fascia Exposed: No Necrotic Quality: Adherent Slough Fat Layer Exposed: No Tendon Exposed: No Muscle Exposed: No Joint Exposed: No Clyatt, Hobert J. ([id number]) Bone Exposed: No Limited to Skin Breakdown Periwound Skin Texture Texture Color No Abnormalities Noted: No No Abnormalities Noted: No Callus: No Atrophie Blanche: No Crepitus: No Cyanosis: No Excoriation: Yes Ecchymosis: No Fluctuance: No Erythema: Yes Friable: No Erythema Location: Circumferential Induration: No Hemosiderin Staining: No Localized Edema: No Mottled: No Rash: No Pallor: No Scarring: No Rubor: No Moisture Temperature / Pain No Abnormalities Noted: No Temperature: No Abnormality Dry / Scaly: No Tenderness on Palpation: Yes Maceration: No Moist: Yes Wound Preparation Ulcer Cleansing: Rinsed/Irrigated with Saline Topical Anesthetic Applied: Other: lidocaine 4%, Electronic Signature(s) Signed: 08/26/2016 5:04:09 PM By: Elpidio EricAfful, [name] BSN, RN Entered By: Elpidio EricAfful, [name] on 08/26/2016 15:32:40 [place], [name] ([id number]) -------------------------------------------------------------------------------- Wound Assessment Details Patient Name: [name] RouteAMERON, Kahmari J. Date of Service: 08/26/2016 3:15 PM Medical Record Number: [id number] Patient Account Number: [id number] Date of Birth/Sex: 28-Apr-1961 8(55 y.o. Male) Treating RN: Afful, RN, BSN, [name] Primary Care Physician: Corky DownsMASOUD, [name] Other Clinician: Referring Physician: Corky DownsMASOUD, [name] Treating Physician/Extender: Rudene ReBritto, Errol Weeks in Treatment: 17 Wound Status Wound Number: 6 Primary Etiology:  Venous Leg Ulcer Wound Location: Left, Proximal Lower Leg Wound Status: Healed - Epithelialized Wounding Event: Gradually Appeared Comorbid History: Arrhythmia, Hypertension Date Acquired: 08/19/2016 Weeks Of Treatment: 1 Clustered Wound: No Photos Photo Uploaded By: Alejandro MullingPinkerton, Debra on 08/26/2016 15:47:58 Wound Measurements Length: (cm) Width: (cm) Depth: (cm) Area: (cm) Volume: (cm) 0 % Reduction in Area: 100% 0 % Reduction in Volume: 100% 0 Epithelialization: Medium (34-66%) 0 Tunneling: No 0 Undermining: No Wound Description Classification: Partial Thickness Wound Margin: Distinct, outline attached Exudate Amount: None Present Foul Odor After Cleansing: No Wound Bed Granulation Amount: None Present (0%) Exposed Structure Necrotic Amount: Large (67-100%) Fascia Exposed: No Necrotic Quality: Eschar Fat Layer Exposed: No Tendon Exposed: No Muscle Exposed: No Joint Exposed: No Parisien, Naquan J. ([id number]) Bone Exposed: No Limited to Skin Breakdown Periwound Skin Texture Texture Color No Abnormalities Noted: No No Abnormalities Noted: No Moisture Temperature / Pain No Abnormalities Noted: No Temperature: No Abnormality Moist: Yes Tenderness on Palpation: Yes Wound Preparation Ulcer Cleansing: Other: soap and water, Topical Anesthetic Applied: None Electronic Signature(s) Signed: 08/26/2016 5:04:09 PM By: Elpidio EricAfful, [name] BSN, RN Entered By: Elpidio EricAfful, [name] on 08/26/2016 15:32:40 [place], [name] ([id number]) -------------------------------------------------------------------------------- Vitals Details Patient Name: [name] RouteAMERON, Koen J. Date of Service: 08/26/2016 3:15 PM Medical Record Number: [id number] Patient Account Number: [id number] Date of Birth/Sex: 28-Apr-1961 46(55 y.o. Male) Treating RN: Phillis HaggisPinkerton, Debi Primary Care Physician: Corky DownsMASOUD, [name] Other Clinician: Referring Physician: Corky DownsMASOUD, [name] Treating Physician/Extender: Rudene ReBritto, Errol Weeks in  Treatment: 17 Vital Signs Time Taken: 15:16 Temperature (F): 97.8 Height (in): 76 Pulse (bpm): 78 Weight (lbs): 320 Respiratory Rate (breaths/min): 20 Body Mass Index (BMI): 38.9 Blood Pressure (mmHg): 121/78 Reference Range: 80 - 120 mg / dl Electronic Signature(s) Signed: 08/27/2016 4:43:58 PM  By: Alejandro Mulling Entered By: Alejandro Mulling on 08/26/2016 15:20:09

## 2016-11-01 DIAGNOSIS — I83019 Varicose veins of right lower extremity with ulcer of unspecified site: Secondary | ICD-10-CM

## 2016-12-15 ENCOUNTER — Emergency Department: Payer: BC Managed Care – PPO

## 2016-12-15 ENCOUNTER — Inpatient Hospital Stay
Admission: EM | Admit: 2016-12-15 | Discharge: 2016-12-21 | DRG: 682 | Disposition: A | Discharge status: Home Health | Payer: BC Managed Care – PPO | Attending: Internal Medicine | Admitting: Internal Medicine

## 2016-12-15 DIAGNOSIS — L8990 Pressure ulcer of unspecified site, unspecified stage: Secondary | ICD-10-CM | POA: Insufficient documentation

## 2016-12-15 DIAGNOSIS — Z86010 Personal history of colonic polyps: Secondary | ICD-10-CM | POA: Diagnosis not present

## 2016-12-15 DIAGNOSIS — D649 Anemia, unspecified: Secondary | ICD-10-CM | POA: Diagnosis not present

## 2016-12-15 DIAGNOSIS — I739 Peripheral vascular disease, unspecified: Secondary | ICD-10-CM | POA: Diagnosis present

## 2016-12-15 DIAGNOSIS — N170 Acute kidney failure with tubular necrosis: Secondary | ICD-10-CM | POA: Diagnosis present

## 2016-12-15 DIAGNOSIS — R791 Abnormal coagulation profile: Secondary | ICD-10-CM | POA: Diagnosis present

## 2016-12-15 DIAGNOSIS — I959 Hypotension, unspecified: Secondary | ICD-10-CM | POA: Diagnosis present

## 2016-12-15 DIAGNOSIS — K921 Melena: Secondary | ICD-10-CM | POA: Diagnosis present

## 2016-12-15 DIAGNOSIS — R600 Localized edema: Secondary | ICD-10-CM

## 2016-12-15 DIAGNOSIS — R531 Weakness: Secondary | ICD-10-CM | POA: Diagnosis present

## 2016-12-15 DIAGNOSIS — F1010 Alcohol abuse, uncomplicated: Secondary | ICD-10-CM | POA: Diagnosis present

## 2016-12-15 DIAGNOSIS — K766 Portal hypertension: Secondary | ICD-10-CM | POA: Diagnosis present

## 2016-12-15 DIAGNOSIS — I878 Other specified disorders of veins: Secondary | ICD-10-CM | POA: Diagnosis present

## 2016-12-15 DIAGNOSIS — Z6839 Body mass index (BMI) 39.0-39.9, adult: Secondary | ICD-10-CM

## 2016-12-15 DIAGNOSIS — R591 Generalized enlarged lymph nodes: Secondary | ICD-10-CM | POA: Diagnosis present

## 2016-12-15 DIAGNOSIS — K7030 Alcoholic cirrhosis of liver without ascites: Secondary | ICD-10-CM | POA: Diagnosis not present

## 2016-12-15 DIAGNOSIS — Z881 Allergy status to other antibiotic agents status: Secondary | ICD-10-CM | POA: Diagnosis not present

## 2016-12-15 DIAGNOSIS — L89153 Pressure ulcer of sacral region, stage 3: Secondary | ICD-10-CM | POA: Diagnosis present

## 2016-12-15 DIAGNOSIS — I5032 Chronic diastolic (congestive) heart failure: Secondary | ICD-10-CM | POA: Diagnosis present

## 2016-12-15 DIAGNOSIS — Z79899 Other long term (current) drug therapy: Secondary | ICD-10-CM

## 2016-12-15 DIAGNOSIS — Z7901 Long term (current) use of anticoagulants: Secondary | ICD-10-CM | POA: Diagnosis not present

## 2016-12-15 DIAGNOSIS — I110 Hypertensive heart disease with heart failure: Secondary | ICD-10-CM | POA: Diagnosis present

## 2016-12-15 DIAGNOSIS — K7031 Alcoholic cirrhosis of liver with ascites: Secondary | ICD-10-CM | POA: Diagnosis not present

## 2016-12-15 DIAGNOSIS — Z8249 Family history of ischemic heart disease and other diseases of the circulatory system: Secondary | ICD-10-CM | POA: Diagnosis not present

## 2016-12-15 DIAGNOSIS — R188 Other ascites: Secondary | ICD-10-CM | POA: Diagnosis not present

## 2016-12-15 DIAGNOSIS — D62 Acute posthemorrhagic anemia: Secondary | ICD-10-CM | POA: Diagnosis present

## 2016-12-15 DIAGNOSIS — I480 Paroxysmal atrial fibrillation: Secondary | ICD-10-CM | POA: Diagnosis present

## 2016-12-15 DIAGNOSIS — K3189 Other diseases of stomach and duodenum: Secondary | ICD-10-CM | POA: Diagnosis present

## 2016-12-15 DIAGNOSIS — E669 Obesity, unspecified: Secondary | ICD-10-CM | POA: Diagnosis present

## 2016-12-15 DIAGNOSIS — F17200 Nicotine dependence, unspecified, uncomplicated: Secondary | ICD-10-CM | POA: Diagnosis present

## 2016-12-15 DIAGNOSIS — N179 Acute kidney failure, unspecified: Secondary | ICD-10-CM

## 2016-12-15 HISTORY — DX: Generalized enlarged lymph nodes: R59.1

## 2016-12-15 HISTORY — DX: Unspecified atrial fibrillation: I48.91

## 2016-12-15 HISTORY — DX: Heart failure, unspecified: I50.9

## 2016-12-15 LAB — BASIC METABOLIC PANEL
Anion gap: 9 (ref 5–15)
BUN: 55 mg/dL — AB (ref 6–20)
CALCIUM: 8.1 mg/dL — AB (ref 8.9–10.3)
CO2: 29 mmol/L (ref 22–32)
CREATININE: 3.74 mg/dL — AB (ref 0.61–1.24)
Chloride: 89 mmol/L — ABNORMAL LOW (ref 101–111)
GFR calc non Af Amer: 17 mL/min — ABNORMAL LOW (ref >60)
GFR, EST AFRICAN AMERICAN: 19 mL/min — AB (ref >60)
Glucose, Bld: 90 mg/dL (ref 65–99)
Potassium: 4.9 mmol/L (ref 3.5–5.1)
SODIUM: 127 mmol/L — AB (ref 135–145)

## 2016-12-15 LAB — CBC
HCT: 26.2 % — ABNORMAL LOW (ref 40.0–52.0)
Hemoglobin: 9.1 g/dL — ABNORMAL LOW (ref 13.0–18.0)
MCH: 35.7 pg — AB (ref 26.0–34.0)
MCHC: 34.7 g/dL (ref 32.0–36.0)
MCV: 103 fL — AB (ref 80.0–100.0)
PLATELETS: 86 10*3/uL — AB (ref 150–440)
RBC: 2.55 MIL/uL — AB (ref 4.40–5.90)
RDW: 16 % — AB (ref 11.5–14.5)
WBC: 7.4 10*3/uL (ref 3.8–10.6)

## 2016-12-15 LAB — TROPONIN I: TROPONIN I: 0.05 ng/mL — AB (ref <0.03)

## 2016-12-15 MED ORDER — SODIUM CHLORIDE 0.9 % IV BOLUS (SEPSIS)
1000.0000 mL | Freq: Once | INTRAVENOUS | Status: AC
  Administered 2016-12-15: 1000 mL via INTRAVENOUS

## 2016-12-15 MED ORDER — SOTALOL HCL 120 MG PO TABS
120.0000 mg | ORAL_TABLET | Freq: Two times a day (BID) | ORAL | Status: DC
  Administered 2016-12-15 – 2016-12-20 (×9): 120 mg via ORAL
  Filled 2016-12-15 (×9): qty 1

## 2016-12-15 MED ORDER — SODIUM CHLORIDE 0.9% FLUSH
3.0000 mL | Freq: Two times a day (BID) | INTRAVENOUS | Status: DC
Start: 2016-12-15 — End: 2016-12-21
  Administered 2016-12-17 – 2016-12-20 (×7): 3 mL via INTRAVENOUS

## 2016-12-15 MED ORDER — SODIUM CHLORIDE 0.9 % IV SOLN
INTRAVENOUS | Status: DC
  Administered 2016-12-15 – 2016-12-17 (×3): via INTRAVENOUS

## 2016-12-15 NOTE — ED Notes (Signed)
Ulcers cleaned and dried. Pink pressure ulcer pads placed on pressure ulcers, small one placed on red shallow ulcer, medium pad placed on black shallow ulcer, large pink pad placed at upper butt for added cushion. Pt lying on R side with head slightly up. Blanket covering pt.

## 2016-12-15 NOTE — ED Provider Notes (Signed)
[facility] Emergency Department Provider Note  Time seen: 3:42 PM  I have reviewed the triage vital signs and the nursing notes.   HISTORY  Chief Complaint Lymphadenopathy and Hypotension    HPI [name] is a 56 y.o. male with a past medical history of hypertension, lymphedema, who presents to the emergency department for generalized weakness. According to the patient for the past 2 weeks he has been extremely fatigued and weak. States he normally ambulates without any assistance however over the past 2 weeks he has been largely bedridden, occasionally will get up but needs significant assistance. Patient bought a powered lift chair because of how weak he has been feeling. Wife finally convinced the patient to come to the emergency department for evaluation today. Patient has noticed increased swelling, fluid leakage from his legs. Denies any chest pain. Denies any shortness of breath.  Past Medical History:  Diagnosis Date  . Anal fissure   . Heart murmur   . Hypertension   . Lymphadenopathy   . Personal history of colonic polyps     Patient Active Problem List   Diagnosis Date Noted  . 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 Hyponatremia 04/17/2016    Past Surgical History:  Procedure Laterality Date  . COLONOSCOPY  2012  . HERNIA REPAIR  2012  . larynx-amyloidosis-laser surgery   2010    Prior to Admission medications   Medication Sig Start Date End Date Taking? Authorizing Provider  benazepril (LOTENSIN) 40 MG tablet Take 40 mg by mouth daily.  02/12/16   Historical Provider, MD  naproxen sodium (ALEVE) 220 MG tablet Take 440 mg by mouth 2 (two) times daily with a meal.    Historical Provider, MD  omeprazole (PRILOSEC) 20 MG capsule Take 20 mg by mouth daily as needed (for  heartburn/indigestion.).     Historical Provider, MD  sotalol (BETAPACE) 80 MG tablet Take 120 mg by mouth 2 (two) times daily.  01/27/16   Historical Provider, MD  XARELTO 20 MG TABS tablet Take 20 mg by mouth every morning.  02/02/16   Historical Provider, MD    Allergies  Allergen Reactions  . Clindamycin/Lincomycin Diarrhea    Family History  Problem Relation Age of Onset  . Hypertension Other     Social History Social History  Substance Use Topics  . Smoking status: Current Every Day Smoker    Packs/day: 1.00    Years: 20.00  . Smokeless tobacco: Never Used  . Alcohol use Yes    Review of Systems Constitutional: Negative for fever. Cardiovascular: Negative for chest pain. Respiratory: Negative for shortness of breath. Gastrointestinal: Negative for abdominal pain, occasional blood in stool per patient. Genitourinary: Negative for dysuria. Urinating less frequently. Neurological: Negative for headache 10-point ROS otherwise negative.  ____________________________________________   PHYSICAL EXAM:  VITAL SIGNS: ED Triage Vitals [12/15/16 1304]  Enc Vitals Group     BP (!) 83/50     Pulse Rate 66     Resp 20     Temp 97.5 F (36.4 C)     Temp Source Oral     SpO2 100 %     Weight (!) 310 lb (140.6 kg)     Height 6\' 4"  (1.93 m)     Head Circumference      Peak Flow  Pain Score      Pain Loc      Pain Edu?      Excl. in GC?     Constitutional: Alert and oriented. Well appearing and in no distress. Eyes: Normal exam ENT   Head: Normocephalic and atraumatic.   Mouth/Throat: Mucous membranes are moist. Cardiovascular: Normal rate, regular rhythm. No murmur Respiratory: Normal respiratory effort without tachypnea nor retractions. Breath sounds are clear  Gastrointestinal: Soft and nontender. No distention.  Musculoskeletal: Nontender with normal range of motion in all extremities. Neurologic:  Normal speech and language. No gross focal neurologic  deficits  Skin:  Skin is warm, dry and intact.  Psychiatric: Mood and affect are normal.   ____________________________________________    EKG  EKG reviewed and interpreted by myself shows what appears to be consistent with atrial fibrillation at 67 bpm, narrow QRS, normal axis, Larson normal intervals besides a prolonged PR interval consistent with first-degree AV block, nonspecific ST changes.  ____________________________________________    RADIOLOGY  Chest x-ray is largely negative  ____________________________________________   INITIAL IMPRESSION / ASSESSMENT AND PLAN / ED COURSE  Pertinent labs & imaging results that were available during my care of the patient were reviewed by me and considered in my medical decision making (see chart for details).  Patient presents for generalized weakness/fatigue, increased peripheral edema, decreased urination. Patient's labs are significant for a 4 point decline in hemoglobin. Rectal exam shows light brown stool however it is guaiac positive. Patient's creatinine has elevated from normal to 3.7, consistent with acute renal insufficiency. Patient remains hypotensive. We will administer IV fluids. Patient will also need diuresis. Patient will require admission to the hospital for further treatment.  ____________________________________________   FINAL CLINICAL IMPRESSION(S) / ED DIAGNOSES  Acute renal insufficiency Hypotension Anemia    Minna AntisKevin Sadhana Frater, MD 12/15/16 1550

## 2016-12-15 NOTE — ED Notes (Signed)
Pt still lying on L side, denies any needs at this time.

## 2016-12-15 NOTE — ED Triage Notes (Signed)
Pt has had swelling of bilateral lower extremities X 2 weeks. At PCP yesterday, report low BP there and was told to come to ER. Pt takes Lasix daily. Legs wrapped upon arrival, clear exudate per family.

## 2016-12-15 NOTE — ED Notes (Signed)
Pt wanted to sit on the side of the bed for a few minutes then was helped back onto R side.

## 2016-12-15 NOTE — ED Notes (Signed)
Pt requested to lay on back for a little bit, turned and has L leg elevated on blanket.

## 2016-12-15 NOTE — ED Notes (Signed)
Positive fecal occult   Pt has multiple pressure ulcers on bottom, upper has black tissue, lower is shallow and red.

## 2016-12-15 NOTE — H&P (Signed)
[facility] [place] [facility]   PATIENT NAME: [name]    MR#:  [id number]  DATE OF BIRTH:  1960/12/24  DATE OF ADMISSION:  12/15/2016  PRIMARY CARE PHYSICIAN: Corky Downs, MD   REQUESTING/REFERRING PHYSICIAN: [email or web address]  CHIEF COMPLAINT:   Chief Complaint  Patient presents with  . Lymphadenopathy  . Hypotension    HISTORY OF PRESENT ILLNESS: [name]  is a 56 y.o. male with a known history of Atrial fibrillation, CHF, hypertension, lymphadenopathy- has chronic swelling on his legs, which was getting worse for last few days so he went to his primary care physician's office he suggested to get some blood work done. But as patient's swelling continued to get worse he just came to emergency room. On workup he was noted to have acute renal failure and his hemoglobin was low than his baseline and his stool was positive for guaiac. His blood pressure was noted to be low in ER on arrival but it responded nicely to IV fluid boluses. He denies any complain of fever or massive bleeding but he noted some little amount of blood in his stool. Anoscopy was 5 years ago by Dr. Opal Sidles and it showed some polyps.  PAST MEDICAL HISTORY:   Past Medical History:  Diagnosis Date  . Anal fissure   . Atrial fibrillation (HCC)   . CHF (congestive heart failure) (HCC)   . Heart murmur   . Hypertension   . Lymphadenopathy   . Personal history of colonic polyps     PAST SURGICAL HISTORY: Past Surgical History:  Procedure Laterality Date  . COLONOSCOPY  2012  . HERNIA REPAIR  2012  . larynx-amyloidosis-laser surgery   2010    SOCIAL HISTORY:  Social History  Substance Use Topics  . Smoking status: Current Every Day Smoker    Packs/day: 1.00    Years: 20.00  . Smokeless tobacco: Never Used  . Alcohol use Yes    FAMILY HISTORY:  Family History  Problem Relation Age of Onset  . Hypertension Other   . Hypertension Brother     DRUG ALLERGIES:   Allergies  Allergen Reactions  . Clindamycin/Lincomycin Diarrhea    REVIEW OF SYSTEMS:   CONSTITUTIONAL: No fever, fatigue or weakness.  EYES: No blurred or double vision.  EARS, NOSE, AND THROAT: No tinnitus or ear pain.  RESPIRATORY: No cough, shortness of breath, wheezing or hemoptysis.  CARDIOVASCULAR: No chest pain, orthopnea, edema.  GASTROINTESTINAL: No nausea, vomiting, diarrhea or abdominal pain.  GENITOURINARY: No dysuria, hematuria.  ENDOCRINE: No polyuria, nocturia,  HEMATOLOGY: No anemia, easy bruising or bleeding SKIN: No rash or lesion. MUSCULOSKELETAL: No joint pain or arthritis. Swelling on the legs.  NEUROLOGIC: No tingling, numbness, weakness.  PSYCHIATRY: No anxiety or depression.   MEDICATIONS AT HOME:  Prior to Admission medications   Medication Sig Start Date End Date Taking? Authorizing Provider  furosemide (LASIX) 40 MG tablet Take 40 mg by mouth daily.    Yes Historical Provider, MD  naproxen sodium (ALEVE) 220 MG tablet Take 440 mg by mouth 2 (two) times daily with a meal.   Yes Historical Provider, MD  potassium chloride SA (K-DUR,KLOR-CON) 20 MEQ tablet Take 10 mEq by mouth daily.    Yes Historical Provider, MD  sotalol (BETAPACE) 80 MG tablet Take 120 mg by mouth 2 (two) times daily.  01/27/16  Yes Historical Provider, MD  XARELTO 20 MG TABS tablet Take 20 mg by mouth every morning.  02/02/16  Yes Historical  Provider, MD  benazepril (LOTENSIN) 40 MG tablet Take 40 mg by mouth daily.  02/12/16   Historical Provider, MD      PHYSICAL EXAMINATION:   VITAL SIGNS: Blood pressure (!) 148/136, pulse 64, temperature 97.5 F (36.4 C), temperature source Oral, resp. rate 13, height 6\' 4"  (1.93 m), weight (!) 140.6 kg (310 lb), SpO2 99 %.  GENERAL:  56 y.o.-year-old Obese patient lying in the bed with no acute distress.  EYES: Pupils equal, round, reactive to light and accommodation. No scleral icterus. Extraocular muscles intact.  HEENT: Head atraumatic,  normocephalic. Oropharynx and nasopharynx clear.  NECK:  Supple, no jugular venous distention. No thyroid enlargement, no tenderness.  LUNGS: Normal breath sounds bilaterally, no wheezing, rales,rhonchi or crepitation. No use of accessory muscles of respiration.  CARDIOVASCULAR: S1, S2 normal. No murmurs, rubs, or gallops.  ABDOMEN: Soft, nontender, nondistended. Bowel sounds present. No organomegaly or mass.  EXTREMITIES: Bilateral pedal edema, no cyanosis, or clubbing.  NEUROLOGIC: Cranial nerves II through XII are intact. Muscle strength 5/5 in all extremities. Sensation intact. Gait not checked.  PSYCHIATRIC: The patient is alert and oriented x 3.  SKIN: No obvious rash, lesion, or ulcer.   LABORATORY PANEL:   CBC  Recent Labs Lab 12/15/16 1307  WBC 7.4  HGB 9.1*  HCT 26.2*  PLT 86*  MCV 103.0*  MCH 35.7*  MCHC 34.7  RDW 16.0*   ------------------------------------------------------------------------------------------------------------------  Chemistries   Recent Labs Lab 12/15/16 1307  NA 127*  K 4.9  CL 89*  CO2 29  GLUCOSE 90  BUN 55*  CREATININE 3.74*  CALCIUM 8.1*   ------------------------------------------------------------------------------------------------------------------ estimated creatinine clearance is 33.8 mL/min (by C-G formula based on SCr of 3.74 mg/dL (H)). ------------------------------------------------------------------------------------------------------------------ No results for input(s): TSH, T4TOTAL, T3FREE, THYROIDAB in the last 72 hours.  Invalid input(s): FREET3   Coagulation profile No results for input(s): INR, PROTIME in the last 168 hours. ------------------------------------------------------------------------------------------------------------------- No results for input(s): DDIMER in the last 72  hours. -------------------------------------------------------------------------------------------------------------------  Cardiac Enzymes  Recent Labs Lab 12/15/16 1307  TROPONINI 0.05*   ------------------------------------------------------------------------------------------------------------------ Invalid input(s): POCBNP  ---------------------------------------------------------------------------------------------------------------  Urinalysis No results found for: COLORURINE, APPEARANCEUR, LABSPEC, PHURINE, GLUCOSEU, HGBUR, BILIRUBINUR, KETONESUR, PROTEINUR, UROBILINOGEN, NITRITE, LEUKOCYTESUR   RADIOLOGY: Dg Chest 2 View  Result Date: 12/15/2016 CLINICAL DATA:  Swelling of the lower extremities over the last 2 weeks, low blood pressure, weakness EXAM: CHEST  2 VIEW COMPARISON:  Chest x-ray of 05/03/2014 and chest x-ray of 01/17/2013 FINDINGS: The lungs are clear but somewhat hyperaerated. There is no evidence of congestive heart failure. No effusion is seen. Mediastinal and hilar contours are unremarkable. Mild cardiomegaly is stable. There is slightly more compression of a lower thoracic vertebral body noted. IMPRESSION: 1. No evidence of congestive heart failure.  No pneumonia. 2. Slight hyper aeration. 3. Mild cardiomegaly. Electronically Signed   By: Dwyane DeePaul  Barry M.D.   On: 12/15/2016 13:40    EKG: Orders placed or performed during the hospital encounter of 12/15/16  . ED EKG within 10 minutes  . ED EKG within 10 minutes    IMPRESSION AND PLAN:  * Acute renal failure   Continue IV fluid, avoid nephrotoxic agents, hold Lasix and Ace inhibitors   Monitor.  * A. Fib   His rate is controlled, continue baseline medications, hold anticoagulants because of active bleeding.  * Acute anemia due to blood loss secondary to GI blood loss   Stool is positive for guaiac, and currently does not require transfusion, monitor.  Hold anticoagulants, call GI consult.  * Chronic  congestive heart failure   Currently does not look fluid overloaded but has chronic lymphadema swelling.   Cardiology consult to help manage A. fib, anti-coagulation and fluid balance with renal failure.  * Hypertension   He was hypotensive on presentation, responded to IV fluid, hold Lasix and ACE inhibitor due to renal failure.      All the records are reviewed and case discussed with ED provider. Management plans discussed with the patient, family and they are in agreement.  CODE STATUS: full Code Status History    Date Active Date Inactive Code Status Order ID Comments User Context   04/17/2016  7:26 PM 04/19/2016  5:50 PM Full Code [id number]  Wyatt Haste, MD ED     Patient's girlfriend was present in the room during my visit.  TOTAL TIME TAKING CARE OF THIS PATIENT: 50 minutes.    Altamese Dilling M.D on 12/15/2016   Between 7am to 6pm - Pager - [phone]  After 6pm go to [email or web address] - password [email or web address]  [facility]  Office  [phone]  CC: Primary care physician; Corky Downs, MD   Note: This dictation was prepared with Dragon dictation along with smaller phrase technology. Any transcriptional errors that result from this process are unintentional.

## 2016-12-16 ENCOUNTER — Inpatient Hospital Stay: Payer: BC Managed Care – PPO

## 2016-12-16 DIAGNOSIS — F1010 Alcohol abuse, uncomplicated: Secondary | ICD-10-CM

## 2016-12-16 DIAGNOSIS — K7030 Alcoholic cirrhosis of liver without ascites: Secondary | ICD-10-CM

## 2016-12-16 DIAGNOSIS — D649 Anemia, unspecified: Secondary | ICD-10-CM

## 2016-12-16 DIAGNOSIS — L8990 Pressure ulcer of unspecified site, unspecified stage: Secondary | ICD-10-CM | POA: Insufficient documentation

## 2016-12-16 LAB — URINALYSIS, ROUTINE W REFLEX MICROSCOPIC
BACTERIA UA: NONE SEEN
BILIRUBIN URINE: NEGATIVE
Glucose, UA: NEGATIVE mg/dL
Ketones, ur: NEGATIVE mg/dL
Leukocytes, UA: NEGATIVE
Nitrite: NEGATIVE
PROTEIN: NEGATIVE mg/dL
SPECIFIC GRAVITY, URINE: 1.014 (ref 1.005–1.030)
pH: 5 (ref 5.0–8.0)

## 2016-12-16 LAB — IRON AND TIBC
IRON: 34 ug/dL — AB (ref 45–182)
SATURATION RATIOS: 33 % (ref 17.9–39.5)
TIBC: 104 ug/dL — ABNORMAL LOW (ref 250–450)
UIBC: 70 ug/dL

## 2016-12-16 LAB — CBC
HCT: 22 % — ABNORMAL LOW (ref 40.0–52.0)
Hemoglobin: 7.6 g/dL — ABNORMAL LOW (ref 13.0–18.0)
MCH: 36 pg — ABNORMAL HIGH (ref 26.0–34.0)
MCHC: 34.5 g/dL (ref 32.0–36.0)
MCV: 104.4 fL — ABNORMAL HIGH (ref 80.0–100.0)
PLATELETS: 70 10*3/uL — AB (ref 150–440)
RBC: 2.11 MIL/uL — ABNORMAL LOW (ref 4.40–5.90)
RDW: 15.6 % — AB (ref 11.5–14.5)
WBC: 6.2 10*3/uL (ref 3.8–10.6)

## 2016-12-16 LAB — BASIC METABOLIC PANEL
Anion gap: 5 (ref 5–15)
BUN: 56 mg/dL — AB (ref 6–20)
CALCIUM: 7.6 mg/dL — AB (ref 8.9–10.3)
CO2: 29 mmol/L (ref 22–32)
Chloride: 96 mmol/L — ABNORMAL LOW (ref 101–111)
Creatinine, Ser: 3.24 mg/dL — ABNORMAL HIGH (ref 0.61–1.24)
GFR, EST AFRICAN AMERICAN: 23 mL/min — AB (ref >60)
GFR, EST NON AFRICAN AMERICAN: 20 mL/min — AB (ref >60)
Glucose, Bld: 96 mg/dL (ref 65–99)
Potassium: 4.6 mmol/L (ref 3.5–5.1)
SODIUM: 130 mmol/L — AB (ref 135–145)

## 2016-12-16 LAB — PROTIME-INR
INR: 2.59
PROTHROMBIN TIME: 28.3 s — AB (ref 11.4–15.2)

## 2016-12-16 LAB — TSH: TSH: 1.496 u[IU]/mL (ref 0.350–4.500)

## 2016-12-16 LAB — HEPATIC FUNCTION PANEL
ALBUMIN: 2.1 g/dL — AB (ref 3.5–5.0)
ALK PHOS: 77 U/L (ref 38–126)
ALT: 21 U/L (ref 17–63)
AST: 48 U/L — ABNORMAL HIGH (ref 15–41)
BILIRUBIN DIRECT: 0.5 mg/dL (ref 0.1–0.5)
BILIRUBIN TOTAL: 1.6 mg/dL — AB (ref 0.3–1.2)
Indirect Bilirubin: 1.1 mg/dL — ABNORMAL HIGH (ref 0.3–0.9)
Total Protein: 5.4 g/dL — ABNORMAL LOW (ref 6.5–8.1)

## 2016-12-16 LAB — FOLATE: FOLATE: 3.1 ng/mL — AB (ref >5.9)

## 2016-12-16 LAB — VITAMIN B12: VITAMIN B 12: 574 pg/mL (ref 180–914)

## 2016-12-16 NOTE — Progress Notes (Signed)
[name] is a 56 y.o. male  [id number]  Primary Cardiologist: Adrian BlackwaterShaukat Eltha Tingley Reason for Consultation: CHF  HPI: 56-year-old white male with a past medical history of atrial fibrillation CHF hypertension renal failure and presented to the hospitalension and dess and inaby to walk. PTATES THAT HULDN"T GET UPROM THE BED  He denied any chest pain or shortness of breath but had leg swelling   Review of Systems: patient does have orthopnea PND and leg swelling   Past Medical History:  Diagnosis Date  . Anal fissure   . Atrial fibrillation (HCC)   . CHF (congestive heart failure) (HCC)   . Heart murmur   . Hypertension   . Lymphadenopathy   . Personal history of colonic polyps     Medications Prior to Admission  Medication Sig Dispense Refill  . furosemide (LASIX) 40 MG tablet Take 40 mg by mouth daily.     . naproxen sodium (ALEVE) 220 MG tablet Take 440 mg by mouth 2 (two) times daily with a meal.    . potassium chloride SA (K-DUR,KLOR-CON) 20 MEQ tablet Take 10 mEq by mouth daily.     . sotalol (BETAPACE) 80 MG tablet Take 120 mg by mouth 2 (two) times daily.     Carlena Hurl. XARELTO 20 MG TABS tablet Take 20 mg by mouth every morning.     . benazepril (LOTENSIN) 40 MG tablet Take 40 mg by mouth daily.        . sodium chloride flush  3 mL Intravenous Q12H  . sotalol  120 mg Oral BID    Infusions: . sodium chloride 100 mL/hr at 12/15/16 2132    Allergies  Allergen Reactions  . Clindamycin/Lincomycin Diarrhea    Social History   Social History  . Marital status: Divorced    Spouse name: N/A  . Number of children: N/A  . Years of education: N/A   Occupational History  . Not on file.   Social History Main Topics  . Smoking status: Current Every Day Smoker    Packs/day: 1.00    Years: 20.00  . Smokeless tobacco: Never Used  . Alcohol use Yes  . Drug use: No  . Sexual activity: Not on file   Other Topics Concern  . Not on file   Social History Narrative  .  No narrative on file    Family History  Problem Relation Age of Onset  . Hypertension Other   . Hypertension Brother     PHYSICAL EXAM: Vitals:   12/15/16 2055 12/16/16 0252  BP:  (!) 105/50  Pulse: (!) 58 (!) 59  Resp:  18  Temp: 97.8 F (36.6 C) 98.5 F (36.9 C)     Intake/Output Summary (Last 24 hours) at 12/16/16 0843 Last data filed at 12/16/16 0600  Gross per 24 hour  Intake           846.67 ml  Output              400 ml  Net           446.67 ml    General:  Well appearing. No respiratory difficulty HEENT: normal Neck: supple. no JVD. Carotids 2+ bilat; no bruits. No lymphadenopathy or thryomegaly appreciated. Cor: PMI nondisplaced. Regular rate & rhythm. No rubs, gallops or murmurs. Lungs: clear Abdomen: soft, nontender, nondistended. No hepatosplenomegaly. No bruits or masses. Good bowel sounds. Extremities: no cyanosis, clubbing, rash, edema Neuro: alert & oriented x 3, cranial nerves grossly intact.  moves all 4 extremities w/o difficulty. Affect pleasant.  ECG: sinus rhythm with low voltage nonspecific ST-T changes  Results for orders placed or performed during the hospital encounter of 12/15/16 (from the past 24 hour(s))  Basic metabolic panel     Status: Abnormal   Collection Time: 12/15/16  1:07 PM  Result Value Ref Range   Sodium 127 (L) 135 - 145 mmol/L   Potassium 4.9 3.5 - 5.1 mmol/L   Chloride 89 (L) 101 - 111 mmol/L   CO2 29 22 - 32 mmol/L   Glucose, Bld 90 65 - 99 mg/dL   BUN 55 (H) 6 - 20 mg/dL   Creatinine, Ser 1.61 (H) 0.61 - 1.24 mg/dL   Calcium 8.1 (L) 8.9 - 10.3 mg/dL   GFR calc non Af Amer 17 (L) >60 mL/min   GFR calc Af Amer 19 (L) >60 mL/min   Anion gap 9 5 - 15  CBC     Status: Abnormal   Collection Time: 12/15/16  1:07 PM  Result Value Ref Range   WBC 7.4 3.8 - 10.6 K/uL   RBC 2.55 (L) 4.40 - 5.90 MIL/uL   Hemoglobin 9.1 (L) 13.0 - 18.0 g/dL   HCT 09.6 (L) 04.5 - 40.9 %   MCV 103.0 (H) 80.0 - 100.0 fL   MCH 35.7 (H) 26.0 -  34.0 pg   MCHC 34.7 32.0 - 36.0 g/dL   RDW 81.1 (H) 91.4 - 78.2 %   Platelets 86 (L) 150 - 440 K/uL  Troponin I     Status: Abnormal   Collection Time: 12/15/16  1:07 PM  Result Value Ref Range   Troponin I 0.05 (HH) <0.03 ng/mL  Basic metabolic panel     Status: Abnormal   Collection Time: 12/16/16  5:12 AM  Result Value Ref Range   Sodium 130 (L) 135 - 145 mmol/L   Potassium 4.6 3.5 - 5.1 mmol/L   Chloride 96 (L) 101 - 111 mmol/L   CO2 29 22 - 32 mmol/L   Glucose, Bld 96 65 - 99 mg/dL   BUN 56 (H) 6 - 20 mg/dL   Creatinine, Ser 9.56 (H) 0.61 - 1.24 mg/dL   Calcium 7.6 (L) 8.9 - 10.3 mg/dL   GFR calc non Af Amer 20 (L) >60 mL/min   GFR calc Af Amer 23 (L) >60 mL/min   Anion gap 5 5 - 15  CBC     Status: Abnormal   Collection Time: 12/16/16  5:12 AM  Result Value Ref Range   WBC 6.2 3.8 - 10.6 K/uL   RBC 2.11 (L) 4.40 - 5.90 MIL/uL   Hemoglobin 7.6 (L) 13.0 - 18.0 g/dL   HCT 21.3 (L) 08.6 - 57.8 %   MCV 104.4 (H) 80.0 - 100.0 fL   MCH 36.0 (H) 26.0 - 34.0 pg   MCHC 34.5 32.0 - 36.0 g/dL   RDW 46.9 (H) 62.9 - 52.8 %   Platelets 70 (L) 150 - 440 K/uL   Dg Chest 2 View  Result Date: 12/15/2016 CLINICAL DATA:  Swelling of the lower extremities over the last 2 weeks, low blood pressure, weakness EXAM: CHEST  2 VIEW COMPARISON:  Chest x-ray of 05/03/2014 and chest x-ray of 01/17/2013 FINDINGS: The lungs are clear but somewhat hyperaerated. There is no evidence of congestive heart failure. No effusion is seen. Mediastinal and hilar contours are unremarkable. Mild cardiomegaly is stable. There is slightly more compression of a lower thoracic vertebral body noted. IMPRESSION: 1.  No evidence of congestive heart failure.  No pneumonia. 2. Slight hyper aeration. 3. Mild cardiomegaly. Electronically Signed   By: Dwyane Dee M.D.   On: 12/15/2016 13:40     ASSESSMENT AND PLAN: congestive heart failure with renal failure along with hypotension.Will get echocardiogram and evaluate the legs  for DVT and advise getting renal for possible dialysis.  Nolin Grell AsT

## 2016-12-16 NOTE — Consult Note (Signed)
WOC Nurse wound consult note Reason for Consult: stage 3 pressure and moisture injury.   Chronic venous stasis.  Wears removable compression.  His girlfriend has gone home to retrieve these stockings.  Refuses Unnas boots for compression.  States he and his girlfriend will apply compression.   Wound type:Moisture and pressure to sacrum, buttocks.  Perspires a lot and has difficulty moving in bed.  Refuses a bed with low air loss mattress feature.  Explained that this could improve the moisture/microclimate of his skin and promote healing.  Maintains he does not want to move from his current bed.  Pressure Ulcer POA: Yes Measurement: 3 cm x 2.4 cm x 0.2 cm 10% slough noted at 12 o'clock.  Wound ZOX:WRUEbed:Pink and moist.   Drainage (amount, consistency, odor) Minimal serosanguinous.  Perspiration and moisture noted to buttocks and sacral area.  Periwound:intact Dressing procedure/placement/frequency:Cleanse sacral wound with soap and water and pat gently dry.  Apply calcium alginate to wound bed for absorption and moisture management.  Cover with sacral silicone border foam dressing.  Change alginate daily.  Peel back sacral foam and replace.  Replace sacral foam every 3 days and PRN soilage.  Will not follow at this time.  Please re-consult if needed.  Maple HudsonKaren Markise Haymer RN BSN CWON Pager [phone]

## 2016-12-16 NOTE — Consult Note (Signed)
[name] MoodKiran Deiontae Rabel MD  [address] [place], [place] [address] Phone: [phone] Fax : [phone]  Consultation  Referring Provider:     No ref. provider found Primary Care Physician:  Corky DownsMASOUD,[name], MD Primary Gastroenterologist:  Dr. Tobi BastosAnna          Reason for Consultation:     GI bleed   Date of Admission:  12/15/2016 Date of Consultation:  12/16/2016         HPI:   [name] is a 56 y.o. male came to the hospital yesterday for worsening of leg swelling . In the ER was noted to have acute renal failure, low hb and stool was guiac positive. Was hypotensive in the ER and then responded to fluids.   His home meds include xarelto. He denies any overt blood loss, some change in bowel habits being more "softer/sloppy" recently.   He says he had a colonoscopy 5 years back and had 2 polyps taken out(10 mm polyp in rectum was tubular adenoma), has had an EGD in the past which he says they found a hiatal hernia. He denies any other GI symptoms.   Past Medical History:  Diagnosis Date  . Anal fissure   . Atrial fibrillation (HCC)   . CHF (congestive heart failure) (HCC)   . Heart murmur   . Hypertension   . Lymphadenopathy   . Personal history of colonic polyps     Past Surgical History:  Procedure Laterality Date  . COLONOSCOPY  2012  . HERNIA REPAIR  2012  . larynx-amyloidosis-laser surgery   2010    Prior to Admission medications   Medication Sig Start Date End Date Taking? Authorizing Provider  furosemide (LASIX) 40 MG tablet Take 40 mg by mouth daily.    Yes Historical Provider, MD  naproxen sodium (ALEVE) 220 MG tablet Take 440 mg by mouth 2 (two) times daily with a meal.   Yes Historical Provider, MD  potassium chloride SA (K-DUR,KLOR-CON) 20 MEQ tablet Take 10 mEq by mouth daily.    Yes Historical Provider, MD  sotalol (BETAPACE) 80 MG tablet Take 120 mg by mouth 2 (two) times daily.  01/27/16  Yes Historical Provider, MD  XARELTO 20 MG TABS tablet Take 20 mg by mouth every  morning.  02/02/16  Yes Historical Provider, MD  benazepril (LOTENSIN) 40 MG tablet Take 40 mg by mouth daily.  02/12/16   Historical Provider, MD    Family History  Problem Relation Age of Onset  . Hypertension Other   . Hypertension Brother      Social History  Substance Use Topics  . Smoking status: Current Every Day Smoker    Packs/day: 1.00    Years: 20.00  . Smokeless tobacco: Never Used  . Alcohol use Yes    Allergies as of 12/15/2016 - Review Complete 12/15/2016  Allergen Reaction Noted  . Clindamycin/lincomycin Diarrhea 04/17/2016    Review of Systems:    All systems reviewed and negative except where noted in HPI.   Physical Exam:  Vital signs in last 24 hours: Temp:  [97.5 F (36.4 C)-98.5 F (36.9 C)] 98.4 F (36.9 C) (01/04 0916) Pulse Rate:  [51-88] 88 (01/04 0916) Resp:  [12-20] 12 (01/04 0916) BP: (72-158)/(43-136) 72/43 (01/04 0916) SpO2:  [91 %-100 %] 97 % (01/04 0916) Weight:  [310 lb (140.6 kg)-315 lb 11.2 oz (143.2 kg)] 315 lb 11.2 oz (143.2 kg) (01/03 2055)   General:   Pleasant, cooperative in NAD Head:  Normocephalic and atraumatic. Eyes:  No icterus.   Conjunctiva pink. PERRLA. Ears:  Normal auditory acuity. Neck:  Supple; no masses or thyroidomegaly, JVD raised Lungs: Respirations even and unlabored. Lungs clear to auscultation bilaterally.   No wheezes, crackles, or rhonchi.  Heart:  Regular rate and rhythm; systolic  murmur, clicks, rubs or gallops Abdomen:  Soft, nondistended, nontender. Normal bowel sounds. No appreciable masses or hepatomegaly.  No rebound or guarding.  Rectal:  Not performed. Extremities:  2+ pitting edema, cyanosis or clubbing. Neurologic:  Alert and oriented x3;  grossly normal neurologically. Skin:  Intact without significant lesions or rashes. Cervical Nodes:  No significant cervical adenopathy. Psych:  Alert and cooperative. Normal affect.  LAB RESULTS:  Recent Labs  12/15/16 1307 12/16/16 0512  WBC 7.4  6.2  HGB 9.1* 7.6*  HCT 26.2* 22.0*  PLT 86* 70*   BMET  Recent Labs  12/15/16 1307 12/16/16 0512  NA 127* 130*  K 4.9 4.6  CL 89* 96*  CO2 29 29  GLUCOSE 90 96  BUN 55* 56*  CREATININE 3.74* 3.24*  CALCIUM 8.1* 7.6*   LFT No results for input(s): PROT, ALBUMIN, AST, ALT, ALKPHOS, BILITOT, BILIDIR, IBILI in the last 72 hours. PT/INR No results for input(s): LABPROT, INR in the last 72 hours.  STUDIES: Dg Chest 2 View  Result Date: 12/15/2016 CLINICAL DATA:  Swelling of the lower extremities over the last 2 weeks, low blood pressure, weakness EXAM: CHEST  2 VIEW COMPARISON:  Chest x-ray of 05/03/2014 and chest x-ray of 01/17/2013 FINDINGS: The lungs are clear but somewhat hyperaerated. There is no evidence of congestive heart failure. No effusion is seen. Mediastinal and hilar contours are unremarkable. Mild cardiomegaly is stable. There is slightly more compression of a lower thoracic vertebral body noted. IMPRESSION: 1. No evidence of congestive heart failure.  No pneumonia. 2. Slight hyper aeration. 3. Mild cardiomegaly. Electronically Signed   By: Dwyane Dee M.D.   On: 12/15/2016 13:40      Impression / Plan:   [name] is a 56 y.o. y/o male with atrial fibrillation on xarelto, admitted with CHF, acute renal failure, found to have new anemia which is normocytic, GUIAC positive. Also I note a very low platelet count. H/o of long standing alcohol abuse ( 1 bottle of wine for many years and only stopped a few months back) he may also have cirrhosis of the liver   Plan : 1. Normocytic anemia- check iron studies, b12,folate,TSH, urine for blood.  2. After he recovers from his CHF can plan for EGD+colonoscopy  3. Low platelet count- get ultrasound of liver to check for cirrhosis, check LFT's ,INR 4. Excess alcohol intake- advised to quit permanently.     LOS: 1 day   [name] Mood, MD  12/16/2016, 9:33 AM

## 2016-12-16 NOTE — Progress Notes (Signed)
   12/16/16 1017  PT Visit Information  Reason Eval/Treat Not Completed Medical issues which prohibited therapy (pt on hold due to low hgb, low BP; will reassess once patient appropriate; )  Contacted RN; Patient's last BP was 72/43 and Hgb was 7.6; awaiting consult for DVT. Will reassess once patient is medically appropriate.  Thank you for this referral. Cornell BarmanMargaret E Trotter, PT, DPT 12/16/2016 10:19 AM

## 2016-12-16 NOTE — Progress Notes (Addendum)
[employer] [facility] [place] at [facility]   PATIENT NAME: [name] BrasMichael [name]    MR#:  [id number]  DATE OF BIRTH:  January 25, 1961  SUBJECTIVE:   Patient here due to hypotension and noted to be in acute kidney injury.  Also noted to have heme + stools.   REVIEW OF SYSTEMS:    Review of Systems  Constitutional: Negative for chills and fever.  HENT: Negative for congestion and tinnitus.   Eyes: Negative for blurred vision and double vision.  Respiratory: Negative for cough, shortness of breath and wheezing.   Cardiovascular: Negative for chest pain, orthopnea and PND.  Gastrointestinal: Negative for abdominal pain, diarrhea, nausea and vomiting.  Genitourinary: Negative for dysuria and hematuria.  Neurological: Negative for dizziness, sensory change and focal weakness.  All other systems reviewed and are negative.   Nutrition: Heart Healthy/Carb control Tolerating Diet: Yes Tolerating PT: bedbound   DRUG ALLERGIES:   Allergies  Allergen Reactions  . Clindamycin/Lincomycin Diarrhea    VITALS:  Blood pressure (!) 73/25, pulse (!) 43, temperature 98.4 F (36.9 C), temperature source Oral, resp. rate 12, height 6\' 4"  (1.93 m), weight (!) 143.2 kg (315 lb 11.2 oz), SpO2 96 %.  PHYSICAL EXAMINATION:   Physical Exam  GENERAL:  56 y.o.-year-old obese patient lying bed in no acute distress.  EYES: Pupils equal, round, reactive to light and accommodation. No scleral icterus. Extraocular muscles intact.  HEENT: Head atraumatic, normocephalic. Oropharynx and nasopharynx clear.  NECK:  Supple, no jugular venous distention. No thyroid enlargement, no tenderness.  LUNGS: Normal breath sounds bilaterally, no wheezing, rales, rhonchi. No use of accessory muscles of respiration.  CARDIOVASCULAR: S1, S2 normal. No murmurs, rubs, or gallops.  ABDOMEN: Soft, nontender, nondistended. Bowel sounds present. No organomegaly or mass.  EXTREMITIES: No cyanosis, clubbing, +2 pitting edema b/l.   Signs of chronic venous stasis b/l.  NEUROLOGIC: Cranial nerves II through XII are intact. No focal Motor or sensory deficits b/l. Globally weak.  PSYCHIATRIC: The patient is alert and oriented x 3.  SKIN: No obvious rash, lesion, or ulcer.    LABORATORY PANEL:   CBC  Recent Labs Lab 12/16/16 0512  WBC 6.2  HGB 7.6*  HCT 22.0*  PLT 70*   ------------------------------------------------------------------------------------------------------------------  Chemistries   Recent Labs Lab 12/16/16 0512 12/16/16 1058  NA 130*  --   K 4.6  --   CL 96*  --   CO2 29  --   GLUCOSE 96  --   BUN 56*  --   CREATININE 3.24*  --   CALCIUM 7.6*  --   AST  --  48*  ALT  --  21  ALKPHOS  --  77  BILITOT  --  1.6*   ------------------------------------------------------------------------------------------------------------------  Cardiac Enzymes  Recent Labs Lab 12/15/16 1307  TROPONINI 0.05*   ------------------------------------------------------------------------------------------------------------------  RADIOLOGY:  Dg Chest 2 View  Result Date: 12/15/2016 CLINICAL DATA:  Swelling of the lower extremities over the last 2 weeks, low blood pressure, weakness EXAM: CHEST  2 VIEW COMPARISON:  Chest x-ray of 05/03/2014 and chest x-ray of 01/17/2013 FINDINGS: The lungs are clear but somewhat hyperaerated. There is no evidence of congestive heart failure. No effusion is seen. Mediastinal and hilar contours are unremarkable. Mild cardiomegaly is stable. There is slightly more compression of a lower thoracic vertebral body noted. IMPRESSION: 1. No evidence of congestive heart failure.  No pneumonia. 2. Slight hyper aeration. 3. Mild cardiomegaly. Electronically Signed   By: Lucienne MinksPaul  Barry M.D.  On: 12/15/2016 13:40   US Venous Img Lower Bilateral  Result Date: 12/16/2016 CLINICAL DATA:  56 year old male with left greater than right lower extremity pitting edema EXAM: BILATERAL LOWER  EXTREMITY VENOUS DOPPLER ULTRASOUND TECHNIQUE: Gray-scale sonography with graded compression, as well as color Doppler and duplex ultrasound were performed to evaluate the lower extremity deep venous systems from the level of the common femoral vein and including the common femoral, femoral, profunda femoral, popliteal and calf veins including the posterior tibial, peroneal and gastrocnemius veins when visible. The superficial great saphenous vein was also interrogated. Spectral Doppler was utilized to evaluate flow at rest and with distal augmentation maneuvers in the common femoral, femoral and popliteal veins. COMPARISON:  None. FINDINGS: RIGHT LOWER EXTREMITY Common Femoral Vein: No evidence of thrombus. Normal compressibility, respiratory phasicity and response to augmentation. Saphenofemoral Junction: No evidence of thrombus. Normal compressibility and flow on color Doppler imaging. Profunda Femoral Vein: No evidence of thrombus. Normal compressibility and flow on color Doppler imaging. Femoral Vein: No evidence of thrombus. Normal compressibility, respiratory phasicity and response to augmentation. Popliteal Vein: No evidence of thrombus. Normal compressibility, respiratory phasicity and response to augmentation. Calf Veins: No evidence of thrombus. Normal compressibility and flow on color Doppler imaging. Superficial Great Saphenous Vein: No evidence of thrombus. Normal compressibility and flow on color Doppler imaging. Venous Reflux:  None. Other Findings:  None. LEFT LOWER EXTREMITY Common Femoral Vein: No evidence of thrombus. Normal compressibility, respiratory phasicity and response to augmentation. Saphenofemoral Junction: No evidence of thrombus. Normal compressibility and flow on color Doppler imaging. Profunda Femoral Vein: No evidence of thrombus. Normal compressibility and flow on color Doppler imaging. Femoral Vein: No evidence of thrombus. Normal compressibility, respiratory phasicity and  response to augmentation. Popliteal Vein: No evidence of thrombus. Normal compressibility, respiratory phasicity and response to augmentation. Calf Veins: No evidence of thrombus. Normal compressibility and flow on color Doppler imaging. Superficial Great Saphenous Vein: No evidence of thrombus. Normal compressibility and flow on color Doppler imaging. Venous Reflux:  None. Other Findings:  None. IMPRESSION: No evidence of deep venous thrombosis. Electronically Signed   By: Malachy Moan M.D.   On: 12/16/2016 15:04     ASSESSMENT AND PLAN:   56 year old male with past history of atrial fibrillation, morbid obesity, CHF, essential hypertension who presents to the hospital due to hypotension, acute kidney injury and also noted to have heme positive stools.   1. Acute kidney injury-likely ATN secondary to severe hypotension.  -continue IV fluids, follow BUN and creatinine and urine output. Renal dose meds avoid nephrotoxins. -Hold Lasix, benazepril  2. Hypotension - etiology unclear.  No evidence of infectious source.  - Echo in May'17 showing normal EF. Will monitor.   - follow hemodynamics.  Cont. IV fluids. Hold anti-HTN  3. Hx of A. Fib - rate controlled. Cont. Sotalol.  - Xarelto on hold due to ARF, GI Bleed.   4. LE Edema - chronic and due to venous stasis.  - no clinical evidence of CHF.  Doppler of LE (-) for DVT - cont. Supportive care.  ?? UNNA boots and wound care follow up.   5. HTN - hold anti-HTN given hypotension.   6. GI bleed - pt. Noted to be anemic w/ heme + stools.  - Hg. Stable and will transfuse if < 7.  Appreciate GI input and plan for endoscopy when clinically stable.   All the records are reviewed and case discussed with Care Management/Social Worker. Management plans discussed with the  patient, family and they are in agreement.  CODE STATUS: Full code  DVT Prophylaxis: TEd's & SCD's.   TOTAL TIME TAKING CARE OF THIS PATIENT: 30 minutes.   POSSIBLE D/C  IN 2-3 DAYS, DEPENDING ON CLINICAL CONDITION.   Houston Siren M.D on 12/16/2016 at 4:25 PM  Between 7am to 6pm - Pager - [phone]  After 6pm go to [email or web address] - Social research officer, government  [employer] [place] [facility]  Office  [phone]  CC: Primary care physician; Corky Downs, MD

## 2016-12-17 ENCOUNTER — Inpatient Hospital Stay: Payer: BC Managed Care – PPO

## 2016-12-17 DIAGNOSIS — R188 Other ascites: Secondary | ICD-10-CM

## 2016-12-17 DIAGNOSIS — K7031 Alcoholic cirrhosis of liver with ascites: Secondary | ICD-10-CM

## 2016-12-17 LAB — GLUCOSE, SEROUS FLUID: GLUCOSE FL: 103 mg/dL

## 2016-12-17 LAB — BASIC METABOLIC PANEL
Anion gap: 5 (ref 5–15)
BUN: 53 mg/dL — ABNORMAL HIGH (ref 6–20)
CALCIUM: 7.2 mg/dL — AB (ref 8.9–10.3)
CO2: 28 mmol/L (ref 22–32)
Chloride: 97 mmol/L — ABNORMAL LOW (ref 101–111)
Creatinine, Ser: 3.02 mg/dL — ABNORMAL HIGH (ref 0.61–1.24)
GFR, EST AFRICAN AMERICAN: 25 mL/min — AB (ref >60)
GFR, EST NON AFRICAN AMERICAN: 22 mL/min — AB (ref >60)
Glucose, Bld: 99 mg/dL (ref 65–99)
Potassium: 4.4 mmol/L (ref 3.5–5.1)
Sodium: 130 mmol/L — ABNORMAL LOW (ref 135–145)

## 2016-12-17 LAB — ALBUMIN, FLUID (OTHER): Albumin, Fluid: <1 g/dL

## 2016-12-17 LAB — BODY FLUID CELL COUNT WITH DIFFERENTIAL
Eos, Fluid: 0 %
Lymphs, Fluid: 24 %
Monocyte-Macrophage-Serous Fluid: 41 %
Neutrophil Count, Fluid: 35 %
Other Cells, Fluid: 0 %
WBC FLUID: 181 uL

## 2016-12-17 LAB — CBC
HCT: 20.4 % — ABNORMAL LOW (ref 40.0–52.0)
HEMOGLOBIN: 7.1 g/dL — AB (ref 13.0–18.0)
MCH: 36.3 pg — ABNORMAL HIGH (ref 26.0–34.0)
MCHC: 34.8 g/dL (ref 32.0–36.0)
MCV: 104.3 fL — ABNORMAL HIGH (ref 80.0–100.0)
Platelets: 67 10*3/uL — ABNORMAL LOW (ref 150–440)
RBC: 1.96 MIL/uL — AB (ref 4.40–5.90)
RDW: 15.7 % — ABNORMAL HIGH (ref 11.5–14.5)
WBC: 6 10*3/uL (ref 3.8–10.6)

## 2016-12-17 LAB — PREPARE RBC (CROSSMATCH)

## 2016-12-17 LAB — PROTEIN, BODY FLUID

## 2016-12-17 LAB — ABO/RH: ABO/RH(D): A POS

## 2016-12-17 MED ORDER — PANTOPRAZOLE SODIUM 40 MG IV SOLR
40.0000 mg | INTRAVENOUS | Status: DC
Start: 2016-12-17 — End: 2016-12-18
  Administered 2016-12-17 – 2016-12-18 (×2): 40 mg via INTRAVENOUS
  Filled 2016-12-17 (×2): qty 40

## 2016-12-17 MED ORDER — SODIUM CHLORIDE 0.9 % IV SOLN
Freq: Once | INTRAVENOUS | Status: AC
  Administered 2016-12-17: 15:00:00 via INTRAVENOUS

## 2016-12-17 MED ORDER — VITAMIN K1 10 MG/ML IJ SOLN
10.0000 mg | Freq: Every day | INTRAMUSCULAR | Status: AC
  Administered 2016-12-17 – 2016-12-19 (×3): 10 mg via SUBCUTANEOUS
  Filled 2016-12-17 (×3): qty 1

## 2016-12-17 MED ORDER — ALBUMIN HUMAN 25 % IV SOLN
25.0000 g | Freq: Once | INTRAVENOUS | Status: AC
  Administered 2016-12-17: 25 g via INTRAVENOUS
  Filled 2016-12-17: qty 100

## 2016-12-17 NOTE — Progress Notes (Signed)
[facility] - [place] at [facility]   PATIENT NAME: [name]    MR#:  [id number]  DATE OF BIRTH:  1961-06-14  SUBJECTIVE:   Patient here due to hypotension and noted to be in acute kidney injury.  Also noted to have heme positive stools. Hemoglobin has drifted down further today. Abdominal ultrasound done today showing evidence of liver cirrhosis with ascites. Patient's INR is elevated secondary to liver disease and use of Xarelto.    REVIEW OF SYSTEMS:    Review of Systems  Constitutional: Negative for chills and fever.  HENT: Negative for congestion and tinnitus.   Eyes: Negative for blurred vision and double vision.  Respiratory: Negative for cough, shortness of breath and wheezing.   Cardiovascular: Negative for chest pain, orthopnea and PND.  Gastrointestinal: Positive for blood in stool. Negative for abdominal pain, diarrhea, nausea and vomiting.  Genitourinary: Negative for dysuria and hematuria.  Neurological: Positive for weakness. Negative for dizziness, sensory change and focal weakness.  All other systems reviewed and are negative.   Nutrition: Clear liquid Tolerating Diet: Yes Tolerating PT: bedbound   DRUG ALLERGIES:   Allergies  Allergen Reactions  . Clindamycin/Lincomycin Diarrhea    VITALS:  Blood pressure 100/60, pulse 70, temperature 98.3 F (36.8 C), temperature source Oral, resp. rate 17, height 6\' 4"  (1.93 m), weight (!) 147.3 kg (324 lb 12.8 oz), SpO2 99 %.  PHYSICAL EXAMINATION:   Physical Exam  GENERAL:  56 y.o.-year-old obese patient lying bed in no acute distress.  EYES: Pupils equal, round, reactive to light and accommodation. No scleral icterus. Extraocular muscles intact.  HEENT: Head atraumatic, normocephalic. Oropharynx and nasopharynx clear.  NECK:  Supple, no jugular venous distention. No thyroid enlargement, no tenderness.  LUNGS: Normal breath sounds bilaterally, no wheezing, rales, rhonchi. No use of  accessory muscles of respiration.  CARDIOVASCULAR: S1, S2 normal. No murmurs, rubs, or gallops.  ABDOMEN: Soft, nontender, distended, + fluid wave. Bowel sounds present. No organomegaly or mass.  EXTREMITIES: No cyanosis, clubbing, +2 pitting edema b/l.  Signs of chronic venous stasis b/l.  NEUROLOGIC: Cranial nerves II through XII are intact. No focal Motor or sensory deficits b/l. Globally weak.  PSYCHIATRIC: The patient is alert and oriented x 3.  SKIN: No obvious rash, lesion, or ulcer.    LABORATORY PANEL:   CBC  Recent Labs Lab 12/17/16 0314  WBC 6.0  HGB 7.1*  HCT 20.4*  PLT 67*   ------------------------------------------------------------------------------------------------------------------  Chemistries   Recent Labs Lab 12/16/16 1058 12/17/16 0314  NA  --  130*  K  --  4.4  CL  --  97*  CO2  --  28  GLUCOSE  --  99  BUN  --  53*  CREATININE  --  3.02*  CALCIUM  --  7.2*  AST 48*  --   ALT 21  --   ALKPHOS 77  --   BILITOT 1.6*  --    ------------------------------------------------------------------------------------------------------------------  Cardiac Enzymes  Recent Labs Lab 12/15/16 1307  TROPONINI 0.05*   ------------------------------------------------------------------------------------------------------------------  RADIOLOGY:  US Abdomen Complete  Result Date: 12/17/2016 CLINICAL DATA:  Cirrhosis EXAM: ABDOMEN ULTRASOUND COMPLETE COMPARISON:  None. FINDINGS: Gallbladder: Gallbladder wall is thickened to 5 mm likely related to the underlying ascites. No cholelithiasis is seen. Common bile duct: Diameter: 5 mm Liver: Diffusely heterogeneous with nodularity consistent with the underlying given clinical history of cirrhosis. IVC: No abnormality visualized. Pancreas: Not visualized. Spleen: Size and appearance within normal limits. Right Kidney:  Length: 12.2 cm. Echogenicity within normal limits. No mass or hydronephrosis visualized. Left  Kidney: Length: 11.2 cm. Echogenicity within normal limits. No mass or hydronephrosis visualized. Abdominal aorta: Mild dilatation of the proximal aorta to 3 cm is noted. Other findings: Diffuse moderate ascites is noted. IMPRESSION: Changes consistent with cirrhosis of the liver with underlying ascites. Gallbladder wall thickening likely related to the underlying ascites. Proximal dilatation of the abdominal aorta. The distal aorta is not well visualized due to overlying bowel gas. Electronically Signed   By: Alcide Clever M.D.   On: 12/17/2016 09:09   US Venous Img Lower Bilateral  Result Date: 12/16/2016 CLINICAL DATA:  56 year old male with left greater than right lower extremity pitting edema EXAM: BILATERAL LOWER EXTREMITY VENOUS DOPPLER ULTRASOUND TECHNIQUE: Gray-scale sonography with graded compression, as well as color Doppler and duplex ultrasound were performed to evaluate the lower extremity deep venous systems from the level of the common femoral vein and including the common femoral, femoral, profunda femoral, popliteal and calf veins including the posterior tibial, peroneal and gastrocnemius veins when visible. The superficial great saphenous vein was also interrogated. Spectral Doppler was utilized to evaluate flow at rest and with distal augmentation maneuvers in the common femoral, femoral and popliteal veins. COMPARISON:  None. FINDINGS: RIGHT LOWER EXTREMITY Common Femoral Vein: No evidence of thrombus. Normal compressibility, respiratory phasicity and response to augmentation. Saphenofemoral Junction: No evidence of thrombus. Normal compressibility and flow on color Doppler imaging. Profunda Femoral Vein: No evidence of thrombus. Normal compressibility and flow on color Doppler imaging. Femoral Vein: No evidence of thrombus. Normal compressibility, respiratory phasicity and response to augmentation. Popliteal Vein: No evidence of thrombus. Normal compressibility, respiratory phasicity and  response to augmentation. Calf Veins: No evidence of thrombus. Normal compressibility and flow on color Doppler imaging. Superficial Great Saphenous Vein: No evidence of thrombus. Normal compressibility and flow on color Doppler imaging. Venous Reflux:  None. Other Findings:  None. LEFT LOWER EXTREMITY Common Femoral Vein: No evidence of thrombus. Normal compressibility, respiratory phasicity and response to augmentation. Saphenofemoral Junction: No evidence of thrombus. Normal compressibility and flow on color Doppler imaging. Profunda Femoral Vein: No evidence of thrombus. Normal compressibility and flow on color Doppler imaging. Femoral Vein: No evidence of thrombus. Normal compressibility, respiratory phasicity and response to augmentation. Popliteal Vein: No evidence of thrombus. Normal compressibility, respiratory phasicity and response to augmentation. Calf Veins: No evidence of thrombus. Normal compressibility and flow on color Doppler imaging. Superficial Great Saphenous Vein: No evidence of thrombus. Normal compressibility and flow on color Doppler imaging. Venous Reflux:  None. Other Findings:  None. IMPRESSION: No evidence of deep venous thrombosis. Electronically Signed   By: Malachy Moan M.D.   On: 12/16/2016 15:04   US Paracentesis  Result Date: 12/17/2016 INDICATION: 56 year old with ascites. EXAM: ULTRASOUND GUIDED PARACENTESIS MEDICATIONS: None. COMPLICATIONS: None immediate. PROCEDURE: Informed written consent was obtained from the patient after a discussion of the risks, benefits and alternatives to treatment. A timeout was performed prior to the initiation of the procedure. Initial ultrasound scanning demonstrates a large amount of ascites within the right lower abdominal quadrant. The right lower abdomen was prepped and draped in the usual sterile fashion. 1% lidocaine was used for local anesthesia. Following this, a 6 Fr Safe-T-Centesis catheter was introduced. An ultrasound image was  saved for documentation purposes. The paracentesis was performed. The catheter was removed and a dressing was applied. The patient tolerated the procedure well without immediate post procedural complication. FINDINGS: A total of  approximately 6.2 L of yellow fluid was removed. Samples were sent to the laboratory as requested by the clinical team. IMPRESSION: Successful ultrasound-guided paracentesis yielding 6.2 liters of peritoneal fluid. Electronically Signed   By: Richarda OverlieAdam  Henn M.D.   On: 12/17/2016 14:28     ASSESSMENT AND PLAN:   56 year old male with past history of atrial fibrillation, morbid obesity, CHF, essential hypertension who presents to the hospital due to hypotension, acute kidney injury and also noted to have heme positive stools.   1. Acute kidney injury- etiology unclear.  ?? Multifactorial.  ATN due to Hypotension, volume loss from GI bleed, ?? Hepatorenal now given Liver Cirrhosis.   - BP improving, will transfuse given GI bleed worsening anemia.  Had Large volume paracentesis today and will given one dose of albumin.  - follow BUN/Cr. Hold IV fluids for now.  Renal dose meds avoid nephrotoxins. -Hold Lasix, benazepril. If not improving consider Nephrology consult.   2. Hypotension - secondary to volume loss from GI bleed and also secondary to chronic liver disease as noted on ultrasound today. -Asymptomatic. We'll get repeat echocardiogram although echo in May 2017 showed normal ejection fraction. -Follow hemodynamics hold antihypertensives for now.  3. Liver cirrhosis-due to alcohol abuse. As per the patient he drinks a bottle of wine daily. -Abdominal ultrasound showing ascites with liver cirrhosis. Status post ultrasound-guided paracentesis today with 6.2 L of fluid removed. Will follow fluid studies. Appreciate gastroenterology input.  4. Hx of A. Fib - rate controlled. Cont. Sotalol.  - Xarelto on hold due to GI Bleed.   5. LE Edema - chronic and due to venous stasis,  anasarca from Liver Cirrhosis.  - no clinical evidence of CHF.  Doppler of LE (-) for DVT - cont. Supportive care.     6. HTN - hold anti-HTN given hypotension.   6. GI bleed - pt. Noted to be anemic w/ heme + stools. Hg. Down from 9.1 to 7.1 today. Will transfuse one unit.  -Follow hemoglobin. Discussed with gastroenterology and plan for endoscopy tomorrow.  7. Coagulopathy - due to Liver disease, Xarelto.  - Vit. K given today. Follow INR  All the records are reviewed and case discussed with Care Management/Social Worker. Management plans discussed with the patient, family and they are in agreement.  CODE STATUS: Full code  DVT Prophylaxis: TEd's & SCD's.   TOTAL TIME TAKING CARE OF THIS PATIENT: 35 minutes.   POSSIBLE D/C IN 2-3 DAYS, DEPENDING ON CLINICAL CONDITION.   [place] SirenSAINANI,Syenna Nazir J M.D on 12/17/2016 at 3:57 PM  Between 7am to 6pm - Pager - [phone]  After 6pm go to [email or web address] - Social research officer, governmentpassword [employer]AS [email or web address]  [employer] [place] [facility]  Office  [phone]  CC: Primary care physician; Corky DownsMASOUD,[name], MD

## 2016-12-17 NOTE — Evaluation (Signed)
Physical Therapy Evaluation Patient Details Name: [name] MRN: [id number] DOB: [birth year] Today's Date: 12/17/2016   History of Present Illness  Pt presented to ER with LE edema and diagnosed with ARF and suspected DVT. Pt received [place] for LE and results were negative for thrombus. He has a pressure sore on his LE and per hospitalist has a history of; A-fib, Anal fissures, CHF, Heart murmur, HTN, Lymphadenopathy, and colonic polyps. His BP was low 94/46 this morning prior to evaluation.  Current Hgb was 7.1 with guaic positive stools, pending transfusion this date and EGD tomorrow.   Clinical Impression  Patient appeared fatigued but overall willing to participate in a physical therapy evaluation. He demonstrated significant edema in both LEs . He did not complain of dizziness or angina during the PT evaluation. Patient is able to sit on the EOB independently but sits with a forward flexed trunk. He was able to correct posture with verbal cues. He demonstrates bilateral UE strength and ROM  grossly symmetrical and WFL. He has strength deficits in bilateral LE. Patient was unable to perform seated marching exercises due to decreased hip flexor strength and required min assistance when performing long arc quads. Patient required mod assist when performing sit to stand transfers and utilized a FWW for additional stability while standing. Patient is currently unable to perform basic household mobility tasks without moderate to max assistance. Patient will benefit from skilled PT to improve functional mobility, strength and cardiovascular endurance. Recommended discharge would be to a SNF from acute hospitalization to further correct functional deficits and generalized weakness.     Follow Up Recommendations SNF    Equipment Recommendations  Rolling walker with 5" wheels    Recommendations for Other Services       Precautions / Restrictions Precautions Precautions: Fall Restrictions Weight  Bearing Restrictions: No      Mobility  Bed Mobility                Pt was on bedside commode with CNA before eval   Transfers Overall transfer level: Needs assistance Equipment used: Rolling walker (2 wheeled) Transfers: Sit to/from Stand Sit to Stand: Mod assist            Ambulation/Gait Ambulation/Gait assistance: Mod assist Ambulation Distance (Feet): 3 Feet Assistive device: Rolling walker (2 wheeled) Gait Pattern/deviations: Step-to pattern;Antalgic;Trunk flexed    Unable to tolerate ambulation due to fatigue     Stairs            Wheelchair Mobility    Modified Rankin (Stroke Patients Only)       Balance Overall balance assessment: Needs assistance   Sitting balance-Leahy Scale: Good     Standing balance support: Bilateral upper extremity supported Standing balance-Leahy Scale: Fair Standing balance comment: Requires RW during stance                             Pertinent Vitals/Pain Pain Assessment: 0-10 Pain Score: 4  Pain Location: L Thigh  Pain Intervention(s): Monitored during session    Home Living Family/patient expects to be discharged to:: Private residence Living Arrangements: Spouse/significant other   Type of Home: Apartment       Home Layout: Two level Bedroom is upstairs  Home Equipment: None      Prior Function Level of Independence: Needs assistance Girlfriend assisting as needed               Hand Dominance  Extremity/Trunk Assessment   Upper Extremity Assessment Upper Extremity Assessment: Overall WFL for tasks assessed    Lower Extremity Assessment Lower Extremity Assessment: Generalized weakness 3-/5    Cervical / Trunk Assessment Cervical / Trunk Assessment: Kyphotic  Communication   Communication: No difficulties  Cognition Arousal/Alertness: Awake/alert Behavior During Therapy: WFL for tasks assessed/performed Overall Cognitive Status: Within Functional Limits for  tasks assessed                      General Comments      Exercises Other Exercises Other Exercises: Seated heel raises AROM - 1x15  Other Exercises: Seated hip add - 1x10 independent Other Exercises: Long arc quads - 1x10 AAROM - min assist  Other Exercises: Seated marches - unable to complete    Assessment/Plan    PT Assessment Patient needs continued PT services  PT Problem List Decreased strength;Decreased mobility;Decreased range of motion;Decreased activity tolerance;Decreased balance;Pain;Obesity;Cardiopulmonary status limiting activity          PT Treatment Interventions Gait training;Therapeutic exercise;Therapeutic activities;Stair training;Balance training;Patient/family education;Functional mobility training    PT Goals (Current goals can be found in the Care Plan section)  Acute Rehab PT Goals Patient Stated Goal: Return Home  PT Goal Formulation: With patient Time For Goal Achievement: 12/31/16 Potential to Achieve Goals: Fair    Frequency Min 2X/week   Barriers to discharge Inaccessible home environment bathroom and bedroom are on 2nd floor     Co-evaluation               End of Session Equipment Utilized During Treatment: Gait belt Activity Tolerance: Patient limited by fatigue Patient left: in chair;with chair alarm set;with call bell/phone within reach           Time: 1112-1129 PT Time Calculation (min) (ACUTE ONLY): 17 min   Charges:   PT Evaluation $PT Eval Moderate Complexity: 1 Procedure     PT G Codes:        Deijah Spikes 12/17/2016, 1:28 PM

## 2016-12-17 NOTE — Procedures (Signed)
Successful US guided paracentesis.  Removed 6.2 liters of yellow fluid.  No immediate complication.  See full report in Imaging.

## 2016-12-17 NOTE — Progress Notes (Signed)
Notified Dr. Dahlia BailiffHugelmyer of BP of 90/48. Ordered to hold betapace.

## 2016-12-17 NOTE — Progress Notes (Addendum)
Wyline MoodKiran Tyden Kann MD [address] [place], [place] [address] Phone: [phone] Fax : [phone]  [name] RouteMichael J Sledge is being followed for Anemia  Day 2 of follow up   Subjective: Denies any hematemesis or melena   Objective: Vital signs in last 24 hours: Vitals:   12/16/16 1230 12/16/16 1934 12/17/16 0416 12/17/16 0912  BP: (!) 96/55 (!) 89/46 (!) 89/49 (!) 94/46  Pulse: 75 98 70 71  Resp:  18 18   Temp:  97.6 F (36.4 C) 97.8 F (36.6 C)   TempSrc:  Oral Oral   SpO2:  97% 99% 99%  Weight:   (!) 324 lb 12.8 oz (147.3 kg)   Height:       Weight change: 14 lb 12.8 oz (6.713 kg)  Intake/Output Summary (Last 24 hours) at 12/17/16 1001 Last data filed at 12/17/16 0601  Gross per 24 hour  Intake              240 ml  Output              150 ml  Net               90 ml     Exam: Heart:: Regular rate and rhythm, S1S2 present or without murmur or extra heart sounds Lungs: normal and clear to auscultation Abdomen: soft, nontender, normal bowel sounds   Lab Results: CBC Latest Ref Rng & Units 12/17/2016 12/16/2016 12/15/2016  WBC 3.8 - 10.6 K/uL 6.0 6.2 7.4  Hemoglobin 13.0 - 18.0 g/dL 7.1(L) 7.6(L) 9.1(L)  Hematocrit 40.0 - 52.0 % 20.4(L) 22.0(L) 26.2(L)  Platelets 150 - 440 K/uL 67(L) 70(L) 86(L)    Micro Results: No results found for this or any previous visit (from the past 240 hour(s)). Studies/Results: Dg Chest 2 View  Result Date: 12/15/2016 CLINICAL DATA:  Swelling of the lower extremities over the last 2 weeks, low blood pressure, weakness EXAM: CHEST  2 VIEW COMPARISON:  Chest x-ray of 05/03/2014 and chest x-ray of 01/17/2013 FINDINGS: The lungs are clear but somewhat hyperaerated. There is no evidence of congestive heart failure. No effusion is seen. Mediastinal and hilar contours are unremarkable. Mild cardiomegaly is stable. There is slightly more compression of a lower thoracic vertebral body noted. IMPRESSION: 1. No evidence of congestive heart failure.  No  pneumonia. 2. Slight hyper aeration. 3. Mild cardiomegaly. Electronically Signed   By: Dwyane DeePaul  Barry M.D.   On: 12/15/2016 13:40   [place] Abdomen Complete  Result Date: 12/17/2016 CLINICAL DATA:  Cirrhosis EXAM: ABDOMEN ULTRASOUND COMPLETE COMPARISON:  None. FINDINGS: Gallbladder: Gallbladder wall is thickened to 5 mm likely related to the underlying ascites. No cholelithiasis is seen. Common bile duct: Diameter: 5 mm Liver: Diffusely heterogeneous with nodularity consistent with the underlying given clinical history of cirrhosis. IVC: No abnormality visualized. Pancreas: Not visualized. Spleen: Size and appearance within normal limits. Right Kidney: Length: 12.2 cm. Echogenicity within normal limits. No mass or hydronephrosis visualized. Left Kidney: Length: 11.2 cm. Echogenicity within normal limits. No mass or hydronephrosis visualized. Abdominal aorta: Mild dilatation of the proximal aorta to 3 cm is noted. Other findings: Diffuse moderate ascites is noted. IMPRESSION: Changes consistent with cirrhosis of the liver with underlying ascites. Gallbladder wall thickening likely related to the underlying ascites. Proximal dilatation of the abdominal aorta. The distal aorta is not well visualized due to overlying bowel gas. Electronically Signed   By: Alcide CleverMark  Lukens M.D.   On: 12/17/2016 09:09   [place] Venous Img Lower  Bilateral  Result Date: 12/16/2016 CLINICAL DATA:  56 year old male with left greater than right lower extremity pitting edema EXAM: BILATERAL LOWER EXTREMITY VENOUS DOPPLER ULTRASOUND TECHNIQUE: Gray-scale sonography with graded compression, as well as color Doppler and duplex ultrasound were performed to evaluate the lower extremity deep venous systems from the level of the common femoral vein and including the common femoral, femoral, profunda femoral, popliteal and calf veins including the posterior tibial, peroneal and gastrocnemius veins when visible. The superficial great saphenous vein was also  interrogated. Spectral Doppler was utilized to evaluate flow at rest and with distal augmentation maneuvers in the common femoral, femoral and popliteal veins. COMPARISON:  None. FINDINGS: RIGHT LOWER EXTREMITY Common Femoral Vein: No evidence of thrombus. Normal compressibility, respiratory phasicity and response to augmentation. Saphenofemoral Junction: No evidence of thrombus. Normal compressibility and flow on color Doppler imaging. Profunda Femoral Vein: No evidence of thrombus. Normal compressibility and flow on color Doppler imaging. Femoral Vein: No evidence of thrombus. Normal compressibility, respiratory phasicity and response to augmentation. Popliteal Vein: No evidence of thrombus. Normal compressibility, respiratory phasicity and response to augmentation. Calf Veins: No evidence of thrombus. Normal compressibility and flow on color Doppler imaging. Superficial Great Saphenous Vein: No evidence of thrombus. Normal compressibility and flow on color Doppler imaging. Venous Reflux:  None. Other Findings:  None. LEFT LOWER EXTREMITY Common Femoral Vein: No evidence of thrombus. Normal compressibility, respiratory phasicity and response to augmentation. Saphenofemoral Junction: No evidence of thrombus. Normal compressibility and flow on color Doppler imaging. Profunda Femoral Vein: No evidence of thrombus. Normal compressibility and flow on color Doppler imaging. Femoral Vein: No evidence of thrombus. Normal compressibility, respiratory phasicity and response to augmentation. Popliteal Vein: No evidence of thrombus. Normal compressibility, respiratory phasicity and response to augmentation. Calf Veins: No evidence of thrombus. Normal compressibility and flow on color Doppler imaging. Superficial Great Saphenous Vein: No evidence of thrombus. Normal compressibility and flow on color Doppler imaging. Venous Reflux:  None. Other Findings:  None. IMPRESSION: No evidence of deep venous thrombosis. Electronically  Signed   By: Malachy Moan M.D.   On: 12/16/2016 15:04   Medications: I have reviewed the patient's current medications. Scheduled Meds: . sodium chloride flush  3 mL Intravenous Q12H  . sotalol  120 mg Oral BID   Continuous Infusions: . sodium chloride 100 mL/hr at 12/17/16 0207   PRN Meds:.   Assessment: Principal Problem:   Acute renal failure (ARF) (HCC) Active Problems:   Anemia   Pressure injury of skin  [name] is a 56 y.o. y/o male with atrial fibrillation on xarelto, admitted with CHF, acute renal failure, found to have new anemia which is normocytic, GUIAC positive. Also I note a very low platelet count. H/o of long standing alcohol abuse ( 1 bottle of wine for many years and only stopped a few months back) . His ultrasound does confirm cirrhosis of the liver , moderate ascites. INR 2.59 BP (!) 94/46   Pulse 71   Temp 97.8 F (36.6 C) (Oral)   Resp 18   Ht 6\' 4"  (1.93 m)   Wt (!) 324 lb 12.8 oz (147.3 kg)   SpO2 99%   BMI 39.54 kg/m    Plan:  1. Normocytic anemia- Drop in Hb since yesterday( could be due to dilution from IV fluids ), Suggest to transfuse a unit of PRBC. INR is elevated which may be due to liver failure or from malnutrition. Suggest 10 mg vitamin K , S.C Q  daily for 3 days (ordered).  Monitor INR daily and watch for encephelopathy . Once transfused and blood pressure better, will need EGD . Cardiac clearance would be needed for anesthesia. If there is evidence of overt bleeding (presently none then consider octreotide and emergent endoscopy to rule out variceal bleed.). Moderate hemoglobin in the urine  Commence on IV PPI(ordered)  2. Ascites - suggest diagnostic and therapeutic paracentesis.(ordered), r/oSBP    3. Excess alcohol intake- advised to quit permanently.   4.Suggest IV multivitamins due to low serum folate likely from alcohol abuse  5. AKI- after blood transfusion- recheck BMP if continues to improve then would suggest pre  renal if not need to consider hepato renal syndrome.     LOS: 2 days   Wyline Mood 12/17/2016, 10:01 AM

## 2016-12-17 NOTE — Progress Notes (Signed)
SUBJECTIVE: Patient is less short of breath but still has dizziness and swelling of the legs   Vitals:   12/16/16 1155 12/16/16 1230 12/16/16 1934 12/17/16 0416  BP: (!) 73/25 (!) 96/55 (!) 89/46 (!) 89/49  Pulse: (!) 43 75 98 70  Resp:   18 18  Temp:   97.6 F (36.4 C) 97.8 F (36.6 C)  TempSrc:   Oral Oral  SpO2: 96%  97% 99%  Weight:    (!) 324 lb 12.8 oz (147.3 kg)  Height:        Intake/Output Summary (Last 24 hours) at 12/17/16 0842 Last data filed at 12/17/16 0601  Gross per 24 hour  Intake              240 ml  Output              150 ml  Net               90 ml    LABS: Basic Metabolic Panel:  Recent Labs  40/98/1100/03/30 0512 12/17/16 0314  NA 130* 130*  K 4.6 4.4  CL 96* 97*  CO2 29 28  GLUCOSE 96 99  BUN 56* 53*  CREATININE 3.24* 3.02*  CALCIUM 7.6* 7.2*   Liver Function Tests:  Recent Labs  12/16/16 1058  AST 48*  ALT 21  ALKPHOS 77  BILITOT 1.6*  PROT 5.4*  ALBUMIN 2.1*   No results for input(s): LIPASE, AMYLASE in the last 72 hours. CBC:  Recent Labs  12/16/16 0512 12/17/16 0314  WBC 6.2 6.0  HGB 7.6* 7.1*  HCT 22.0* 20.4*  MCV 104.4* 104.3*  PLT 70* 67*   Cardiac Enzymes:  Recent Labs  12/15/16 1307  TROPONINI 0.05*   BNP: Invalid input(s): POCBNP D-Dimer: No results for input(s): DDIMER in the last 72 hours. Hemoglobin A1C: No results for input(s): HGBA1C in the last 72 hours. Fasting Lipid Panel: No results for input(s): CHOL, HDL, LDLCALC, TRIG, CHOLHDL, LDLDIRECT in the last 72 hours. Thyroid Function Tests:  Recent Labs  12/16/16 0512  TSH 1.496   Anemia Panel:  Recent Labs  12/16/16 0512  VITAMINB12 574  FOLATE 3.1*  TIBC 104*  IRON 34*     PHYSICAL EXAM General: Well developed, well nourished, in no acute distress HEENT:  Normocephalic and atramatic Neck:  No JVD.  Lungs: Clear bilaterally to auscultation and percussion. Heart: HRRR . Normal S1 and S2 without gallops or murmurs.  Abdomen: Bowel  sounds are positive, abdomen soft and non-tender  Msk:  Back normal, normal gait. Normal strength and tone for age. Extremities: No clubbing, cyanosis or edema.   Neuro: Alert and oriented X 3. Psych:  Good affect, responds appropriately  TELEMETRY:Sinus rhythm  ASSESSMENT AND PLAN: Hypotension with renal failure and congestive heart failure with history of cirrhosis of the liver. Waiting for echocardiogram to decide whether patient would be a candidate for Entresto.  Principal Problem:   Acute renal failure (ARF) (HCC) Active Problems:   Anemia   Pressure injury of skin    Talyn Dessert A, MD, [facility] 12/17/2016 8:42 AM

## 2016-12-18 ENCOUNTER — Inpatient Hospital Stay: Payer: BC Managed Care – PPO | Admitting: Anesthesiology

## 2016-12-18 ENCOUNTER — Encounter: Payer: Self-pay | Admitting: Anesthesiology

## 2016-12-18 ENCOUNTER — Inpatient Hospital Stay
Admit: 2016-12-18 | Discharge: 2016-12-18 | Disposition: A | Discharge status: Home / Self Care | Payer: BC Managed Care – PPO | Attending: Specialist | Admitting: Specialist

## 2016-12-18 ENCOUNTER — Encounter
Admission: EM | Disposition: A | Discharge status: Home Health | Payer: Self-pay | Source: Home / Self Care | Attending: Specialist

## 2016-12-18 HISTORY — PX: ESOPHAGOGASTRODUODENOSCOPY (EGD) WITH PROPOFOL: SHX5813

## 2016-12-18 LAB — MISC LABCORP TEST (SEND OUT): LABCORP TEST CODE: 19588

## 2016-12-18 LAB — COMPREHENSIVE METABOLIC PANEL
ALK PHOS: 74 U/L (ref 38–126)
ALT: 19 U/L (ref 17–63)
AST: 38 U/L (ref 15–41)
Albumin: 2.2 g/dL — ABNORMAL LOW (ref 3.5–5.0)
Anion gap: 6 (ref 5–15)
BUN: 45 mg/dL — ABNORMAL HIGH (ref 6–20)
CALCIUM: 7.8 mg/dL — AB (ref 8.9–10.3)
CO2: 29 mmol/L (ref 22–32)
Chloride: 97 mmol/L — ABNORMAL LOW (ref 101–111)
Creatinine, Ser: 2.13 mg/dL — ABNORMAL HIGH (ref 0.61–1.24)
GFR, EST AFRICAN AMERICAN: 38 mL/min — AB (ref >60)
GFR, EST NON AFRICAN AMERICAN: 33 mL/min — AB (ref >60)
Glucose, Bld: 108 mg/dL — ABNORMAL HIGH (ref 65–99)
Potassium: 4.1 mmol/L (ref 3.5–5.1)
Sodium: 132 mmol/L — ABNORMAL LOW (ref 135–145)
TOTAL PROTEIN: 5.6 g/dL — AB (ref 6.5–8.1)
Total Bilirubin: 1.9 mg/dL — ABNORMAL HIGH (ref 0.3–1.2)

## 2016-12-18 LAB — CBC
HCT: 25.3 % — ABNORMAL LOW (ref 40.0–52.0)
HEMOGLOBIN: 8.6 g/dL — AB (ref 13.0–18.0)
MCH: 35 pg — ABNORMAL HIGH (ref 26.0–34.0)
MCHC: 34.2 g/dL (ref 32.0–36.0)
MCV: 102.3 fL — AB (ref 80.0–100.0)
Platelets: 70 10*3/uL — ABNORMAL LOW (ref 150–440)
RBC: 2.47 MIL/uL — AB (ref 4.40–5.90)
RDW: 18.1 % — ABNORMAL HIGH (ref 11.5–14.5)
WBC: 7.7 10*3/uL (ref 3.8–10.6)

## 2016-12-18 LAB — TYPE AND SCREEN
Blood Product Expiration Date: 201801152359
ISSUE DATE / TIME: 201801051504
UNIT TYPE AND RH: 6200

## 2016-12-18 LAB — ECHOCARDIOGRAM COMPLETE
HEIGHTINCHES: 76 in
WEIGHTICAEL: 5196.8 [oz_av]

## 2016-12-18 LAB — PROTIME-INR
INR: 1.66
PROTHROMBIN TIME: 19.8 s — AB (ref 11.4–15.2)

## 2016-12-18 SURGERY — ESOPHAGOGASTRODUODENOSCOPY (EGD) WITH PROPOFOL
Anesthesia: General

## 2016-12-18 MED ORDER — LIDOCAINE 2% (20 MG/ML) 5 ML SYRINGE
INTRAMUSCULAR | Status: AC
  Filled 2016-12-18: qty 5

## 2016-12-18 MED ORDER — PROPOFOL 10 MG/ML IV BOLUS
INTRAVENOUS | Status: DC | PRN
Start: 2016-12-18 — End: 2016-12-18
  Administered 2016-12-18: 70 mg via INTRAVENOUS

## 2016-12-18 MED ORDER — PROPOFOL 500 MG/50ML IV EMUL
INTRAVENOUS | Status: AC
  Filled 2016-12-18: qty 50

## 2016-12-18 MED ORDER — LACTATED RINGERS IV SOLN
INTRAVENOUS | Status: DC | PRN
  Administered 2016-12-18: 13:00:00 via INTRAVENOUS

## 2016-12-18 MED ORDER — PROPOFOL 500 MG/50ML IV EMUL
INTRAVENOUS | Status: DC | PRN
  Administered 2016-12-18: 120 ug/kg/min via INTRAVENOUS

## 2016-12-18 MED ORDER — MIDAZOLAM HCL 2 MG/2ML IJ SOLN
INTRAMUSCULAR | Status: AC
  Filled 2016-12-18: qty 2

## 2016-12-18 MED ORDER — LIDOCAINE 2% (20 MG/ML) 5 ML SYRINGE
INTRAMUSCULAR | Status: DC | PRN
  Administered 2016-12-18: 50 mg via INTRAVENOUS

## 2016-12-18 NOTE — Progress Notes (Signed)
Spoke with dr. Renae Glosswieting regarding low sbp 90's-100 and sotalol. Per md okay to give. Will give after egd patient currently npo. Will continue to monitor closely

## 2016-12-18 NOTE — Progress Notes (Signed)
A&O. Up with heavy 2 person assist. Incontinent at times. Wound on sacrum. Dressing changed. No complaints through the night.

## 2016-12-18 NOTE — Transfer of Care (Signed)
Immediate Anesthesia Transfer of Care Note  Patient: [name]  Procedure(s) Performed: Procedure(s): ESOPHAGOGASTRODUODENOSCOPY (EGD) WITH PROPOFOL (N/A)  Patient Location: Endoscopy Unit  Anesthesia Type:General  Level of Consciousness: awake  Airway & Oxygen Therapy: Patient Spontanous Breathing and Patient connected to nasal cannula oxygen  Post-op Assessment: Report given to RN and Post -op Vital signs reviewed and stable  Post vital signs: Reviewed  Last Vitals:  Vitals:   12/18/16 1141 12/18/16 1346  BP: 100/65 (!) 90/50  Pulse: 69 60  Resp: 17 18  Temp: 36.8 C 36.2 C    Last Pain:  Vitals:   12/18/16 1141  TempSrc: Oral  PainSc:          Complications: No apparent anesthesia complications

## 2016-12-18 NOTE — Brief Op Note (Signed)
12/15/2016 - 12/18/2016  2:02 PM  PATIENT:  [name]  56 y.o. male  PRE-OPERATIVE DIAGNOSIS:  gi bleed cirrhosis  POST-OPERATIVE DIAGNOSIS:  portal gastropathy  PROCEDURE:  Procedure(s): ESOPHAGOGASTRODUODENOSCOPY (EGD) WITH PROPOFOL (N/A)  SURGEON:  Surgeon(s) and Role:    * Dierdra Salameh Raeanne Gathers, MD - Primary  PHYSICIAN ASSISTANT:   ASSISTANTS: none   ANESTHESIA:   MAC  EBL:  Total I/O In: 400 [I.V.:400] Out: 225 [Urine:225]  BLOOD ADMINISTERED:none  DRAINS: none   LOCAL MEDICATIONS USED:  NONE  SPECIMEN:  No Specimen  DISPOSITION OF SPECIMEN:  N/A  COUNTS:    TOURNIQUET:  * No tourniquets in log *  DICTATION: .Note written in EPIC  PLAN OF CARE: return to floor  PATIENT DISPOSITION:  PACU - hemodynamically stable.   Delay start of Pharmacological VTE agent (>24hrs) due to surgical blood loss or risk of bleeding: not applicable

## 2016-12-18 NOTE — Progress Notes (Signed)
SUBJECTIVE: Patient and endoscopy   Vitals:   12/18/16 1346 12/18/16 1356 12/18/16 1406 12/18/16 1416  BP: (!) 90/50 (!) 95/56 (!) 86/57 100/71  Pulse: 60 (!) 59 (!) 59 62  Resp: 18 13 12 11   Temp: 97.1 F (36.2 C)     TempSrc: Tympanic     SpO2: 97% 100% 100% 100%  Weight:      Height:        Intake/Output Summary (Last 24 hours) at 12/18/16 1444 Last data filed at 12/18/16 1339  Gross per 24 hour  Intake          1270.76 ml  Output              875 ml  Net           395.76 ml    LABS: Basic Metabolic Panel:  Recent Labs  16/09/9600/05/18 0314 12/18/16 0420  NA 130* 132*  K 4.4 4.1  CL 97* 97*  CO2 28 29  GLUCOSE 99 108*  BUN 53* 45*  CREATININE 3.02* 2.13*  CALCIUM 7.2* 7.8*   Liver Function Tests:  Recent Labs  12/16/16 1058 12/18/16 0420  AST 48* 38  ALT 21 19  ALKPHOS 77 74  BILITOT 1.6* 1.9*  PROT 5.4* 5.6*  ALBUMIN 2.1* 2.2*   No results for input(s): LIPASE, AMYLASE in the last 72 hours. CBC:  Recent Labs  12/17/16 0314 12/18/16 0420  WBC 6.0 7.7  HGB 7.1* 8.6*  HCT 20.4* 25.3*  MCV 104.3* 102.3*  PLT 67* 70*   Cardiac Enzymes: No results for input(s): CKTOTAL, CKMB, CKMBINDEX, TROPONINI in the last 72 hours. BNP: Invalid input(s): POCBNP D-Dimer: No results for input(s): DDIMER in the last 72 hours. Hemoglobin A1C: No results for input(s): HGBA1C in the last 72 hours. Fasting Lipid Panel: No results for input(s): CHOL, HDL, LDLCALC, TRIG, CHOLHDL, LDLDIRECT in the last 72 hours. Thyroid Function Tests:  Recent Labs  12/16/16 0512  TSH 1.496   Anemia Panel:  Recent Labs  12/16/16 0512  VITAMINB12 574  FOLATE 3.1*  TIBC 104*  IRON 34*     PHYSICAL EXAM General: Well developed, well nourished, in no acute distress HEENT:  Normocephalic and atramatic Neck:  No JVD.  Lungs: Clear bilaterally to auscultation and percussion. Heart: HRRR . Normal S1 and S2 without gallops or murmurs.  Abdomen: Bowel sounds are positive,  abdomen soft and non-tender  Msk:  Back normal, normal gait. Normal strength and tone for age. Extremities: No clubbing, cyanosis or edema.   Neuro: Alert and oriented X 3. Psych:  Good affect, responds appropriately  TELEMETRY:Sinus rhythm  ASSESSMENT AND PLAN: Portal hypertension with cirrhosis of the liver and anasarca/renal failure presented to the hospital with hypotension and swelling of the leg. Echocardiogram shows left ventricular ejection fraction 45% with wall motion abnormality suggestive of coronary artery disease. However patient has elevated INR and cirrhosis of the liver and is not a candidate for revascularization.  Principal Problem:   Acute renal failure (ARF) (HCC) Active Problems:   Anemia   Pressure injury of skin    [name] A, MD, [facility] 12/18/2016 2:44 PM

## 2016-12-18 NOTE — Consult Note (Signed)
[name] Minium, MD [facility]   [address], [address] [place], [place] [address] Phone: [phone] Fax : [phone]   Subjective: No acute events overnight. He denies any complaints, girl friend bedside. He had echo done yesterday. Report pending.   Objective: Vital signs in last 24 hours: Vitals:   12/18/16 1346 12/18/16 1356 12/18/16 1406 12/18/16 1416  BP: (!) 90/50 (!) 95/56 (!) 86/57 100/71  Pulse: 60 (!) 59 (!) 59 62  Resp: 18 13 12 11   Temp: 97.1 F (36.2 C)     TempSrc: Tympanic     SpO2: 97% 100% 100% 100%  Weight:      Height:       Weight change:   Intake/Output Summary (Last 24 hours) at 12/18/16 1428 Last data filed at 12/18/16 1339  Gross per 24 hour  Intake          1270.76 ml  Output              875 ml  Net           395.76 ml     Exam: Heart:: Regular rate and rhythm, S1S2 present, soft systolic murmur heard on auscultation Lungs: clear to auscultation Abdomen: soft, nontender, distended, normal bowel sounds   Lab Results: @LABTEST2 @ Micro Results: Recent Results (from the past 240 hour(s))  Body fluid culture     Status: None (Preliminary result)   Collection Time: 12/17/16  1:15 PM  Result Value Ref Range Status   Specimen Description PERITONEAL  Final   Special Requests NONE  Final   Gram Stain   Final    FEW WBC PRESENT,BOTH PMN AND MONONUCLEAR NO ORGANISMS SEEN    Culture   Final    NO GROWTH 1 DAY Performed at [facility]    Report Status PENDING  Incomplete   Studies/Results: US Abdomen Complete  Result Date: 12/17/2016 CLINICAL DATA:  Cirrhosis EXAM: ABDOMEN ULTRASOUND COMPLETE COMPARISON:  None. FINDINGS: Gallbladder: Gallbladder wall is thickened to 5 mm likely related to the underlying ascites. No cholelithiasis is seen. Common bile duct: Diameter: 5 mm Liver: Diffusely heterogeneous with nodularity consistent with the underlying given clinical history of cirrhosis. IVC: No abnormality visualized. Pancreas: Not  visualized. Spleen: Size and appearance within normal limits. Right Kidney: Length: 12.2 cm. Echogenicity within normal limits. No mass or hydronephrosis visualized. Left Kidney: Length: 11.2 cm. Echogenicity within normal limits. No mass or hydronephrosis visualized. Abdominal aorta: Mild dilatation of the proximal aorta to 3 cm is noted. Other findings: Diffuse moderate ascites is noted. IMPRESSION: Changes consistent with cirrhosis of the liver with underlying ascites. Gallbladder wall thickening likely related to the underlying ascites. Proximal dilatation of the abdominal aorta. The distal aorta is not well visualized due to overlying bowel gas. Electronically Signed   By: Alcide Clever M.D.   On: 12/17/2016 09:09   US Venous Img Lower Bilateral  Result Date: 12/16/2016 CLINICAL DATA:  56 year old male with left greater than right lower extremity pitting edema EXAM: BILATERAL LOWER EXTREMITY VENOUS DOPPLER ULTRASOUND TECHNIQUE: Gray-scale sonography with graded compression, as well as color Doppler and duplex ultrasound were performed to evaluate the lower extremity deep venous systems from the level of the common femoral vein and including the common femoral, femoral, profunda femoral, popliteal and calf veins including the posterior tibial, peroneal and gastrocnemius veins when visible. The superficial great saphenous vein was also interrogated. Spectral Doppler was utilized to evaluate flow at rest and with distal augmentation maneuvers in the common  femoral, femoral and popliteal veins. COMPARISON:  None. FINDINGS: RIGHT LOWER EXTREMITY Common Femoral Vein: No evidence of thrombus. Normal compressibility, respiratory phasicity and response to augmentation. Saphenofemoral Junction: No evidence of thrombus. Normal compressibility and flow on color Doppler imaging. Profunda Femoral Vein: No evidence of thrombus. Normal compressibility and flow on color Doppler imaging. Femoral Vein: No evidence of thrombus.  Normal compressibility, respiratory phasicity and response to augmentation. Popliteal Vein: No evidence of thrombus. Normal compressibility, respiratory phasicity and response to augmentation. Calf Veins: No evidence of thrombus. Normal compressibility and flow on color Doppler imaging. Superficial Great Saphenous Vein: No evidence of thrombus. Normal compressibility and flow on color Doppler imaging. Venous Reflux:  None. Other Findings:  None. LEFT LOWER EXTREMITY Common Femoral Vein: No evidence of thrombus. Normal compressibility, respiratory phasicity and response to augmentation. Saphenofemoral Junction: No evidence of thrombus. Normal compressibility and flow on color Doppler imaging. Profunda Femoral Vein: No evidence of thrombus. Normal compressibility and flow on color Doppler imaging. Femoral Vein: No evidence of thrombus. Normal compressibility, respiratory phasicity and response to augmentation. Popliteal Vein: No evidence of thrombus. Normal compressibility, respiratory phasicity and response to augmentation. Calf Veins: No evidence of thrombus. Normal compressibility and flow on color Doppler imaging. Superficial Great Saphenous Vein: No evidence of thrombus. Normal compressibility and flow on color Doppler imaging. Venous Reflux:  None. Other Findings:  None. IMPRESSION: No evidence of deep venous thrombosis. Electronically Signed   By: Malachy MoanHeath  McCullough M.D.   On: 12/16/2016 15:04   [place] Paracentesis  Result Date: 12/17/2016 INDICATION: [id number] year old with ascites. EXAM: ULTRASOUND GUIDED PARACENTESIS MEDICATIONS: None. COMPLICATIONS: None immediate. PROCEDURE: Informed written consent was obtained from the patient after a discussion of the risks, benefits and alternatives to treatment. A timeout was performed prior to the initiation of the procedure. Initial ultrasound scanning demonstrates a large amount of ascites within the right lower abdominal quadrant. The right lower abdomen was prepped and  draped in the usual sterile fashion. 1% lidocaine was used for local anesthesia. Following this, a 6 Fr Safe-T-Centesis catheter was introduced. An ultrasound image was saved for documentation purposes. The paracentesis was performed. The catheter was removed and a dressing was applied. The patient tolerated the procedure well without immediate post procedural complication. FINDINGS: A total of approximately 6.2 L of yellow fluid was removed. Samples were sent to the laboratory as requested by the clinical team. IMPRESSION: Successful ultrasound-guided paracentesis yielding 6.2 liters of peritoneal fluid. Electronically Signed   By: Richarda OverlieAdam  Henn M.D.   On: 12/17/2016 14:28   Medications: I have reviewed the patient's current medications. Scheduled Meds: . [MAR Hold] phytonadione  10 mg Subcutaneous Daily  . [MAR Hold] sodium chloride flush  3 mL Intravenous Q12H  . [MAR Hold] sotalol  120 mg Oral BID   Continuous Infusions: PRN Meds:.   Assessment: Principal Problem:   Acute renal failure (ARF) (HCC) Active Problems:   Anemia   Pressure injury of skin Cirrhosis of liver with decompensation Alcohol abuse   Plan: - Discussed with Dr Renae GlossWieting and cleared him for EGD - EGD today showed moderate portal gastropathy which can explain his chronic blood loss in the setting of Xarelto - Can resume xarelto - Ascitic fluid analysis c/w portal HTN and negative for SBP - AKI improving - Check ferritin and replace if needed - Need abstinence from ETOH - He needs to follow up with hepatology as out pt for liver transplant evaluation   LOS: 3 days   Rohini Vanga 12/18/2016,  2:28 PM

## 2016-12-18 NOTE — Anesthesia Preprocedure Evaluation (Addendum)
Anesthesia Evaluation  Patient identified by MRN, date of birth, ID band Patient awake    Reviewed: Allergy & Precautions, NPO status , Patient's Chart, lab work & pertinent test results, reviewed documented beta blocker date and time   Airway Mallampati: II  TM Distance: >3 FB     Dental  (+) Chipped   Pulmonary Current Smoker,           Cardiovascular hypertension, Pt. on medications + Peripheral Vascular Disease and +CHF  + dysrhythmias Atrial Fibrillation      Neuro/Psych    GI/Hepatic   Endo/Other    Renal/GU Renal InsufficiencyRenal disease     Musculoskeletal   Abdominal   Peds  Hematology  (+) anemia ,   Anesthesia Other Findings Hypotensive. INternist gives us the ok to proceed. Dr. Allegra LaiVanga has talked to the internist again today. Will proceed.  Reproductive/Obstetrics                           Anesthesia Physical Anesthesia Plan  ASA: III  Anesthesia Plan: General   Post-op Pain Management:    Induction: Intravenous  Airway Management Planned: Nasal Cannula  Additional Equipment:   Intra-op Plan:   Post-operative Plan:   Informed Consent: I have reviewed the patients History and Physical, chart, labs and discussed the procedure including the risks, benefits and alternatives for the proposed anesthesia with the patient or authorized representative who has indicated his/her understanding and acceptance.     Plan Discussed with: CRNA  Anesthesia Plan Comments:         Anesthesia Quick Evaluation

## 2016-12-18 NOTE — Transfer of Care (Signed)
Immediate Anesthesia Transfer of Care Note  Patient: [name]  Procedure(s) Performed: Procedure(s): ESOPHAGOGASTRODUODENOSCOPY (EGD) WITH PROPOFOL (N/A)  Patient Location: PACU  Anesthesia Type:no anesthesia given  Level of Consciousness: awake, alert  and oriented  Airway & Oxygen Therapy: Patient Spontanous Breathing  Post-op Assessment: Report given to RN  Post vital signs: stable  Last Vitals:  Vitals:   12/18/16 0453 12/18/16 0500  BP: (!) 85/49 (!) 92/55  Pulse: 67   Resp: 16   Temp:      Last Pain:  Vitals:   12/17/16 1923  TempSrc: Oral         Complications: No apparent anesthesia complications

## 2016-12-18 NOTE — Anesthesia Postprocedure Evaluation (Signed)
Anesthesia Post Note  Patient: Nolon BussingMichael J Steger  Procedure(s) Performed: Procedure(s) (LRB): ESOPHAGOGASTRODUODENOSCOPY (EGD) WITH PROPOFOL (N/A)  Patient location during evaluation: Endoscopy Anesthesia Type: General Level of consciousness: awake and alert Pain management: pain level controlled Vital Signs Assessment: post-procedure vital signs reviewed and stable Respiratory status: spontaneous breathing, nonlabored ventilation, respiratory function stable and patient connected to nasal cannula oxygen Cardiovascular status: blood pressure returned to baseline and stable Postop Assessment: no signs of nausea or vomiting Anesthetic complications: no     Last Vitals:  Vitals:   12/18/16 1416 12/18/16 1453  BP: 100/71 100/66  Pulse: 62   Resp: 11   Temp:      Last Pain:  Vitals:   12/18/16 1346  TempSrc: Tympanic  PainSc:                  Kieth Hartis S

## 2016-12-18 NOTE — Progress Notes (Signed)
Patient ID: [name], male   DOB: 07-18-1961, 56 y.o.   MRN: [id number]  No contraindications to endoscopy procedure at this time. Patient examined and full note to follow.  Dr Renae GlossWieting

## 2016-12-18 NOTE — Progress Notes (Signed)
Patient ID: [name], male   DOB: [birth year], 56 y.o.   MRN: [id number]  [employer] PROGRESS NOTE  [name] NWG:[id number] DOB: 23-Sep-1961 DOA: 12/15/2016 PCP: [name] Downs, MD  HPI/Subjective: Patient feeling okay. Offers no complaints. Patient states he will not go back to drinking any alcohol.  Objective: Vitals:   12/18/16 1416 12/18/16 1453  BP: 100/71 100/66  Pulse: 62   Resp: 11   Temp:      Filed Weights   12/15/16 1304 12/15/16 2055 12/17/16 0416  Weight: (!) 140.6 kg (310 lb) (!) 143.2 kg (315 lb 11.2 oz) (!) 147.3 kg (324 lb 12.8 oz)    ROS: Review of Systems  Constitutional: Negative for chills and fever.  Eyes: Negative for blurred vision.  Respiratory: Negative for cough and shortness of breath.   Cardiovascular: Negative for chest pain.  Gastrointestinal: Negative for abdominal pain, constipation, diarrhea, nausea and vomiting.  Genitourinary: Negative for dysuria.  Musculoskeletal: Negative for joint pain.  Neurological: Negative for dizziness and headaches.   Exam: Physical Exam  Constitutional: He is oriented to person, place, and time.  HENT:  Nose: No mucosal edema.  Mouth/Throat: No oropharyngeal exudate or posterior oropharyngeal edema.  Eyes: Conjunctivae, EOM and lids are normal. Pupils are equal, round, and reactive to light.  Neck: No JVD present. Carotid bruit is not present. No edema present. No thyroid mass and no thyromegaly present.  Cardiovascular: S1 normal and S2 normal.  Exam reveals no gallop.   Murmur heard.  Systolic murmur is present with a grade of 2/6  Pulses:      Dorsalis pedis pulses are 2+ on the right side, and 2+ on the left side.  Respiratory: No respiratory distress. He has no wheezes. He has no rhonchi. He has no rales.  GI: Soft. Bowel sounds are normal. There is no tenderness.  Musculoskeletal:       Right ankle: He exhibits swelling.       Left ankle: He exhibits swelling.  Lymphadenopathy:     He has no cervical adenopathy.  Neurological: He is alert and oriented to person, place, and time. No cranial nerve deficit.  Skin: Skin is warm. No rash noted. Nails show no clubbing.  Psychiatric: He has a normal mood and affect.      Data Reviewed: Basic Metabolic Panel:  Recent Labs Lab 12/15/16 1307 12/16/16 0512 12/17/16 0314 12/18/16 0420  NA 127* 130* 130* 132*  K 4.9 4.6 4.4 4.1  CL 89* 96* 97* 97*  CO2 29 29 28 29   GLUCOSE 90 96 99 108*  BUN 55* 56* 53* 45*  CREATININE 3.74* 3.24* 3.02* 2.13*  CALCIUM 8.1* 7.6* 7.2* 7.8*   Liver Function Tests:  Recent Labs Lab 12/16/16 1058 12/18/16 0420  AST 48* 38  ALT 21 19  ALKPHOS 77 74  BILITOT 1.6* 1.9*  PROT 5.4* 5.6*  ALBUMIN 2.1* 2.2*   CBC:  Recent Labs Lab 12/15/16 1307 12/16/16 0512 12/17/16 0314 12/18/16 0420  WBC 7.4 6.2 6.0 7.7  HGB 9.1* 7.6* 7.1* 8.6*  HCT 26.2* 22.0* 20.4* 25.3*  MCV 103.0* 104.4* 104.3* 102.3*  PLT 86* 70* 67* 70*   Cardiac Enzymes:  Recent Labs Lab 12/15/16 1307  TROPONINI 0.05*    Recent Results (from the past 240 hour(s))  Body fluid culture     Status: None (Preliminary result)   Collection Time: 12/17/16  1:15 PM  Result Value Ref Range Status   Specimen Description PERITONEAL  Final  Special Requests NONE  Final   Gram Stain   Final    FEW WBC PRESENT,BOTH PMN AND MONONUCLEAR NO ORGANISMS SEEN    Culture   Final    NO GROWTH 1 DAY Performed at [facility]    Report Status PENDING  Incomplete     Studies: US Abdomen Complete  Result Date: 12/17/2016 CLINICAL DATA:  Cirrhosis EXAM: ABDOMEN ULTRASOUND COMPLETE COMPARISON:  None. FINDINGS: Gallbladder: Gallbladder wall is thickened to 5 mm likely related to the underlying ascites. No cholelithiasis is seen. Common bile duct: Diameter: 5 mm Liver: Diffusely heterogeneous with nodularity consistent with the underlying given clinical history of cirrhosis. IVC: No abnormality visualized. Pancreas:  Not visualized. Spleen: Size and appearance within normal limits. Right Kidney: Length: 12.2 cm. Echogenicity within normal limits. No mass or hydronephrosis visualized. Left Kidney: Length: 11.2 cm. Echogenicity within normal limits. No mass or hydronephrosis visualized. Abdominal aorta: Mild dilatation of the proximal aorta to 3 cm is noted. Other findings: Diffuse moderate ascites is noted. IMPRESSION: Changes consistent with cirrhosis of the liver with underlying ascites. Gallbladder wall thickening likely related to the underlying ascites. Proximal dilatation of the abdominal aorta. The distal aorta is not well visualized due to overlying bowel gas. Electronically Signed   By: Alcide Clever M.D.   On: 12/17/2016 09:09   US Paracentesis  Result Date: 12/17/2016 INDICATION: 56 year old with ascites. EXAM: ULTRASOUND GUIDED PARACENTESIS MEDICATIONS: None. COMPLICATIONS: None immediate. PROCEDURE: Informed written consent was obtained from the patient after a discussion of the risks, benefits and alternatives to treatment. A timeout was performed prior to the initiation of the procedure. Initial ultrasound scanning demonstrates a large amount of ascites within the right lower abdominal quadrant. The right lower abdomen was prepped and draped in the usual sterile fashion. 1% lidocaine was used for local anesthesia. Following this, a 6 Fr Safe-T-Centesis catheter was introduced. An ultrasound image was saved for documentation purposes. The paracentesis was performed. The catheter was removed and a dressing was applied. The patient tolerated the procedure well without immediate post procedural complication. FINDINGS: A total of approximately 6.2 L of yellow fluid was removed. Samples were sent to the laboratory as requested by the clinical team. IMPRESSION: Successful ultrasound-guided paracentesis yielding 6.2 liters of peritoneal fluid. Electronically Signed   By: Richarda Overlie M.D.   On: 12/17/2016 14:28     Scheduled Meds: . phytonadione  10 mg Subcutaneous Daily  . sodium chloride flush  3 mL Intravenous Q12H  . sotalol  120 mg Oral BID    Assessment/Plan:  1. Acute kidney injury. Likely hypovolemic and ATN secondary to hypotension. Holding Lasix and benazepril. Creatinine slowly improving. Hold off on fluids. 2. Hypotension. Hold off on IV fluids and antihypertensive medications at this point. 3. Gastrointestinal bleed with acute hemorrhagic anemia. Portal gastropathy seen on EGD. Continue to watch hemoglobin. 4. Paroxysmal atrial fibrillation. Lesia Hausen currently on hold with acute kidney injury and GI bleed. Depending on kidney function may have to go on Eliquis. Continue sotalol. 5. Liver cirrhosis secondary to alcohol abuse. Send off hepatitis profiles 6. Ascites status post paracentesis of 6.2 L. 7. Coagulopathy secondary to liver disease and Xarelto. Patient on vitamin K.  Code Status:     Code Status Orders        Start     Ordered   12/15/16 2007  Full code  Continuous     12/15/16 2006    Code Status History    Date Active Date Inactive  Code Status Order ID Comments User Context   04/17/2016  7:26 PM 04/19/2016  5:50 PM Full Code [id number]  Wyatt Hasteavid K Hower, MD ED     Family Communication: Family at bedside Disposition Plan: When creatinine normalizes can potentially go home.  Consultants:  Gastroenterology  Cardiology  Procedures:  Paracentesis  Endoscopy  Time spent: 28 minutes  Alford HighlandWIETING, Kima Malenfant  [employer]

## 2016-12-19 LAB — CBC
HCT: 25.3 % — ABNORMAL LOW (ref 40.0–52.0)
Hemoglobin: 8.7 g/dL — ABNORMAL LOW (ref 13.0–18.0)
MCH: 35.2 pg — AB (ref 26.0–34.0)
MCHC: 34.3 g/dL (ref 32.0–36.0)
MCV: 102.7 fL — ABNORMAL HIGH (ref 80.0–100.0)
PLATELETS: 72 10*3/uL — AB (ref 150–440)
RBC: 2.46 MIL/uL — ABNORMAL LOW (ref 4.40–5.90)
RDW: 17.5 % — ABNORMAL HIGH (ref 11.5–14.5)
WBC: 8.9 10*3/uL (ref 3.8–10.6)

## 2016-12-19 LAB — BASIC METABOLIC PANEL
ANION GAP: 4 — AB (ref 5–15)
BUN: 35 mg/dL — AB (ref 6–20)
CALCIUM: 7.8 mg/dL — AB (ref 8.9–10.3)
CO2: 30 mmol/L (ref 22–32)
Chloride: 100 mmol/L — ABNORMAL LOW (ref 101–111)
Creatinine, Ser: 1.59 mg/dL — ABNORMAL HIGH (ref 0.61–1.24)
GFR calc Af Amer: 54 mL/min — ABNORMAL LOW (ref >60)
GFR, EST NON AFRICAN AMERICAN: 47 mL/min — AB (ref >60)
GLUCOSE: 110 mg/dL — AB (ref 65–99)
Potassium: 4 mmol/L (ref 3.5–5.1)
Sodium: 134 mmol/L — ABNORMAL LOW (ref 135–145)

## 2016-12-19 MED ORDER — FOLIC ACID 1 MG PO TABS
1.0000 mg | ORAL_TABLET | Freq: Every day | ORAL | Status: DC
  Administered 2016-12-19 – 2016-12-21 (×3): 1 mg via ORAL
  Filled 2016-12-19 (×3): qty 1

## 2016-12-19 MED ORDER — APIXABAN 5 MG PO TABS
5.0000 mg | ORAL_TABLET | Freq: Two times a day (BID) | ORAL | Status: DC
  Administered 2016-12-19 – 2016-12-21 (×5): 5 mg via ORAL
  Filled 2016-12-19 (×5): qty 1

## 2016-12-19 NOTE — Progress Notes (Signed)
SUBJECTIVE: Patient is feeling much better with no shortness of breath or chest pain   Vitals:   12/19/16 0511 12/19/16 1000 12/19/16 1001 12/19/16 1122  BP: 103/83 91/68 91/68  (!) 95/53  Pulse: 69 72 72 67  Resp: 18   17  Temp: 98.2 F (36.8 C)   98.3 F (36.8 C)  TempSrc: Oral   Oral  SpO2: 93% 99% 98% 100%  Weight:      Height:        Intake/Output Summary (Last 24 hours) at 12/19/16 1221 Last data filed at 12/19/16 0900  Gross per 24 hour  Intake              760 ml  Output              700 ml  Net               60 ml    LABS: Basic Metabolic Panel:  Recent Labs  84/13/2400/05/30 0420 12/19/16 0413  NA 132* 134*  K 4.1 4.0  CL 97* 100*  CO2 29 30  GLUCOSE 108* 110*  BUN 45* 35*  CREATININE 2.13* 1.59*  CALCIUM 7.8* 7.8*   Liver Function Tests:  Recent Labs  12/18/16 0420  AST 38  ALT 19  ALKPHOS 74  BILITOT 1.9*  PROT 5.6*  ALBUMIN 2.2*   No results for input(s): LIPASE, AMYLASE in the last 72 hours. CBC:  Recent Labs  12/18/16 0420 12/19/16 0413  WBC 7.7 8.9  HGB 8.6* 8.7*  HCT 25.3* 25.3*  MCV 102.3* 102.7*  PLT 70* 72*   Cardiac Enzymes: No results for input(s): CKTOTAL, CKMB, CKMBINDEX, TROPONINI in the last 72 hours. BNP: Invalid input(s): POCBNP D-Dimer: No results for input(s): DDIMER in the last 72 hours. Hemoglobin A1C: No results for input(s): HGBA1C in the last 72 hours. Fasting Lipid Panel: No results for input(s): CHOL, HDL, LDLCALC, TRIG, CHOLHDL, LDLDIRECT in the last 72 hours. Thyroid Function Tests: No results for input(s): TSH, T4TOTAL, T3FREE, THYROIDAB in the last 72 hours.  Invalid input(s): FREET3 Anemia Panel: No results for input(s): VITAMINB12, FOLATE, FERRITIN, TIBC, IRON, RETICCTPCT in the last 72 hours.   PHYSICAL EXAM General: Well developed, well nourished, in no acute distress HEENT:  Normocephalic and atramatic Neck:  No JVD.  Lungs: Clear bilaterally to auscultation and percussion. Heart: HRRR .  Normal S1 and S2 without gallops or murmurs.  Abdomen: Bowel sounds are positive, abdomen soft and non-tender  Msk:  Back normal, normal gait. Normal strength and tone for age. Extremities: No clubbing, cyanosis or edema.   Neuro: Alert and oriented X 3. Psych:  Good affect, responds appropriately  TELEMETRY:Sinus rhythm  ASSESSMENT AND PLAN: Acute renal failure with hypotension and cirrhosis of the liver along with atypical chest pain and CHF. Left ventricular ejection fraction is only mildly reduced with creatinine improving significantly after dialysis. May consider the patient being put on losartan once blood pressure is improved.  Principal Problem:   Acute renal failure (ARF) (HCC) Active Problems:   Anemia   Pressure injury of skin    Areana Kosanke A, MD, [facility] 12/19/2016 12:21 PM

## 2016-12-19 NOTE — Consult Note (Signed)
  [name] Miniumarren Wohl, MD [facility]   [address] [place], [place] [address] Phone: [phone] Fax : [phone]   Subjective: No acute events overnight. He denies any complaints, girl friend bedside. Tolerating food well   Objective: Vital signs in last 24 hours: Vitals:   12/19/16 0511 12/19/16 1000 12/19/16 1001 12/19/16 1122  BP: 103/83 91/68 91/68  (!) 95/53  Pulse: 69 72 72 67  Resp: 18   17  Temp: 98.2 F (36.8 C)   98.3 F (36.8 C)  TempSrc: Oral   Oral  SpO2: 93% 99% 98% 100%  Weight:      Height:       Weight change:   Intake/Output Summary (Last 24 hours) at 12/19/16 1458 Last data filed at 12/19/16 1437  Gross per 24 hour  Intake              600 ml  Output              800 ml  Net             -200 ml     Exam: Heart:: Regular rate and rhythm, S1S2 present, soft systolic murmur heard on auscultation Lungs: clear to auscultation Abdomen: soft, nontender, distended, normal bowel sounds   Lab Results: @LABTEST2 @ Micro Results: Recent Results (from the past 240 hour(s))  Body fluid culture     Status: None (Preliminary result)   Collection Time: 12/17/16  1:15 PM  Result Value Ref Range Status   Specimen Description PERITONEAL  Final   Special Requests NONE  Final   Gram Stain   Final    FEW WBC PRESENT,BOTH PMN AND MONONUCLEAR NO ORGANISMS SEEN    Culture   Final    NO GROWTH 2 DAYS [facility]    Report Status PENDING  Incomplete   Studies/Results: No results found. Medications: I have reviewed the patient's current medications. Scheduled Meds: . apixaban  5 mg Oral BID  . folic acid  1 mg Oral Daily  . sodium chloride flush  3 mL Intravenous Q12H  . sotalol  120 mg Oral BID   Continuous Infusions: PRN Meds:.   Assessment: Principal Problem:   Acute renal failure (ARF) (HCC) Active Problems:   Anemia   Pressure injury of skin Cirrhosis of liver with decompensation Alcohol abuse S/p EGD that showed  moderate portal gastropathy which can explain his chronic blood loss in the setting of Xarelto  Plan: - Continue xarelto - Ascitic fluid analysis c/w portal HTN and negative for SBP - AKI improving, Cr not baseline yet, diuretics on hold, can restart in next 24-48hrs, recommend lasix:spiranolactone dose ratio 2:5 to prevent AKI. May not need high dose of sotalol after resuming diuretics to maintain his blood pressure - maintain low salt diet - Check ferritin and replace if needed - Added daily folic acid as it was low - Need abstinence from ETOH - He needs to follow up with hepatology as out pt for liver transplant evaluation and cirrhosis care - Avoid NSAIDs   LOS: 4 days   Rohini Vanga 12/19/2016, 2:58 PM

## 2016-12-19 NOTE — Progress Notes (Signed)
Patient ID: [name], male   DOB: 29-Oct-1961, 56 y.o.   MRN: [id number]  [employer] PROGRESS NOTE  [name] WGN:[id number] DOB: [birth year] DOA: 12/15/2016 PCP: Corky Downs, MD  HPI/Subjective: Patient feeling better. Still pretty weak. Eating well and urinating well.  Objective: Vitals:   12/19/16 1001 12/19/16 1122  BP: 91/68 (!) 95/53  Pulse: 72 67  Resp:  17  Temp:  98.3 F (36.8 C)    Filed Weights   12/15/16 1304 12/15/16 2055 12/17/16 0416  Weight: (!) 140.6 kg (310 lb) (!) 143.2 kg (315 lb 11.2 oz) (!) 147.3 kg (324 lb 12.8 oz)    ROS: Review of Systems  Constitutional: Negative for chills and fever.  Eyes: Negative for blurred vision.  Respiratory: Negative for cough and shortness of breath.   Cardiovascular: Negative for chest pain.  Gastrointestinal: Negative for abdominal pain, constipation, diarrhea, nausea and vomiting.  Genitourinary: Negative for dysuria.  Musculoskeletal: Negative for joint pain.  Neurological: Negative for dizziness and headaches.   Exam: Physical Exam  Constitutional: He is oriented to person, place, and time.  HENT:  Nose: No mucosal edema.  Mouth/Throat: No oropharyngeal exudate or posterior oropharyngeal edema.  Eyes: Conjunctivae, EOM and lids are normal. Pupils are equal, round, and reactive to light.  Neck: No JVD present. Carotid bruit is not present. No edema present. No thyroid mass and no thyromegaly present.  Cardiovascular: S1 normal and S2 normal.  Exam reveals no gallop.   Murmur heard.  Systolic murmur is present with a grade of 2/6  Pulses:      Dorsalis pedis pulses are 2+ on the right side, and 2+ on the left side.  Respiratory: No respiratory distress. He has no wheezes. He has no rhonchi. He has no rales.  GI: Soft. Bowel sounds are normal. There is no tenderness.  Musculoskeletal:       Right ankle: He exhibits swelling.       Left ankle: He exhibits swelling.  Lymphadenopathy:    He has  no cervical adenopathy.  Neurological: He is alert and oriented to person, place, and time. No cranial nerve deficit.  Skin: Skin is warm. No rash noted. Nails show no clubbing.  Psychiatric: He has a normal mood and affect.      Data Reviewed: Basic Metabolic Panel:  Recent Labs Lab 12/15/16 1307 12/16/16 0512 12/17/16 0314 12/18/16 0420 12/19/16 0413  NA 127* 130* 130* 132* 134*  K 4.9 4.6 4.4 4.1 4.0  CL 89* 96* 97* 97* 100*  CO2 29 29 28 29 30   GLUCOSE 90 96 99 108* 110*  BUN 55* 56* 53* 45* 35*  CREATININE 3.74* 3.24* 3.02* 2.13* 1.59*  CALCIUM 8.1* 7.6* 7.2* 7.8* 7.8*   Liver Function Tests:  Recent Labs Lab 12/16/16 1058 12/18/16 0420  AST 48* 38  ALT 21 19  ALKPHOS 77 74  BILITOT 1.6* 1.9*  PROT 5.4* 5.6*  ALBUMIN 2.1* 2.2*   CBC:  Recent Labs Lab 12/15/16 1307 12/16/16 0512 12/17/16 0314 12/18/16 0420 12/19/16 0413  WBC 7.4 6.2 6.0 7.7 8.9  HGB 9.1* 7.6* 7.1* 8.6* 8.7*  HCT 26.2* 22.0* 20.4* 25.3* 25.3*  MCV 103.0* 104.4* 104.3* 102.3* 102.7*  PLT 86* 70* 67* 70* 72*   Cardiac Enzymes:  Recent Labs Lab 12/15/16 1307  TROPONINI 0.05*    Recent Results (from the past 240 hour(s))  Body fluid culture     Status: None (Preliminary result)   Collection Time: 12/17/16  1:15  PM  Result Value Ref Range Status   Specimen Description PERITONEAL  Final   Special Requests NONE  Final   Gram Stain   Final    FEW WBC PRESENT,BOTH PMN AND MONONUCLEAR NO ORGANISMS SEEN    Culture   Final    NO GROWTH 2 DAYS [facility]    Report Status PENDING  Incomplete      Scheduled Meds: . apixaban  5 mg Oral BID  . folic acid  1 mg Oral Daily  . sodium chloride flush  3 mL Intravenous Q12H  . sotalol  120 mg Oral BID    Assessment/Plan:  1. Acute kidney injury. Likely hypovolemic and ATN secondary to hypotension. Holding Lasix and benazepril and Naprosyn. Creatinine slowly improving. Hold off on fluids. 2. Hypotension. Hold  off on IV fluids and antihypertensive medications at this point. 3. Gastrointestinal bleed with acute hemorrhagic anemia. Portal gastropathy seen on EGD. Continue to watch hemoglobin. Gastroenterologist gave permission to restart anticoagulation. 4. Paroxysmal atrial fibrillation. Eliquis started today, watch closely for bleeding. Continue sotalol. 5. Liver cirrhosis secondary to alcohol abuse. Sent off hepatitis profiles today, may need prophylactic SBP prophylaxis. 6. Ascites status post paracentesis of 6.2 L. 7. Coagulopathy secondary to liver disease and Xarelto. Patient on vitamin K.  Code Status:     Code Status Orders        Start     Ordered   12/15/16 2007  Full code  Continuous     12/15/16 2006    Code Status History    Date Active Date Inactive Code Status Order ID Comments User Context   04/17/2016  7:26 PM 04/19/2016  5:50 PM Full Code [id number]  Wyatt Hasteavid K Hower, MD ED     Family Communication: Family Yesterday Disposition Plan: Last physical therapy note recommended rehabilitation  Consultants:  Gastroenterology  Cardiology  Procedures:  Paracentesis  Endoscopy  Time spent: 25 minutes  Alford HighlandWIETING, Dearl Rudden  [employer]

## 2016-12-19 NOTE — Progress Notes (Signed)
ANTICOAGULATION CONSULT NOTE - Initial Consult  Pharmacy Consult for apixaban Indication: atrial fibrillation  Allergies  Allergen Reactions  . Clindamycin/Lincomycin Diarrhea    Patient Measurements: Height: 6\' 4"  (193 cm) Weight: (!) 324 lb 12.8 oz (147.3 kg) IBW/kg (Calculated) : 86.8   Vital Signs: Temp: 98.2 F (36.8 C) (01/07 0511) Temp Source: Oral (01/07 0511) BP: 91/68 (01/07 1001) Pulse Rate: 72 (01/07 1001)  Labs:  Recent Labs  12/16/16 1058  12/17/16 0314 12/18/16 0420 12/19/16 0413  HGB  --   < > 7.1* 8.6* 8.7*  HCT  --   --  20.4* 25.3* 25.3*  PLT  --   --  67* 70* 72*  LABPROT 28.3*  --   --  19.8*  --   INR 2.59  --   --  1.66  --   CREATININE  --   --  3.02* 2.13* 1.59*  < > = values in this interval not displayed.  Estimated Creatinine Clearance: 81.4 mL/min (by C-G formula based on SCr of 1.59 mg/dL (H)).   Medical History: Past Medical History:  Diagnosis Date  . Anal fissure   . Atrial fibrillation (HCC)   . CHF (congestive heart failure) (HCC)   . Heart murmur   . Hypertension   . Lymphadenopathy   . Personal history of colonic polyps     Medications:  Prescriptions Prior to Admission  Medication Sig Dispense Refill Last Dose  . furosemide (LASIX) 40 MG tablet Take 40 mg by mouth daily.    12/15/2016 at 0800  . naproxen sodium (ALEVE) 220 MG tablet Take 440 mg by mouth 2 (two) times daily with a meal.   prn at prn  . potassium chloride SA (K-DUR,KLOR-CON) 20 MEQ tablet Take 10 mEq by mouth daily.    12/15/2016 at 0800  . sotalol (BETAPACE) 80 MG tablet Take 120 mg by mouth 2 (two) times daily.    12/15/2016 at 0700  . XARELTO 20 MG TABS tablet Take 20 mg by mouth every morning.    12/15/2016 at 1100  . benazepril (LOTENSIN) 40 MG tablet Take 40 mg by mouth daily.    Not Taking at Unknown time    Assessment: 56 y/o M with a h/o PAF on Xarelto PTA admitted with AKI to resume anticoagulation with Eliquis after GIB.   Plan:  Will begin  apixaban 5 mg bid.   [name], [name] D 12/19/2016,10:13 AM

## 2016-12-19 NOTE — Progress Notes (Signed)
Patient refused dressing change - said it was done during night shift last night.

## 2016-12-19 NOTE — NC FL2 (Signed)
   MEDICAID FL2 LEVEL OF CARE SCREENING TOOL     IDENTIFICATION  Patient Name: [name] Birthdate: 1961/08/22 Sex: male Admission Date (Current Location): 12/15/2016  [place]   Facility and Address:  [facility], [address], [place], [place] [address]      Provider Number: [id number]  Attending Physician Name and Address:  Alford Highlandichard Wieting, MD  Relative Name and Phone Number:       Current Level of Care: Hospital Recommended Level of Care: Skilled Nursing Facility Prior Approval Number:    Date Approved/Denied: 12/19/16 PASRR Number: [id number]  Discharge Plan: SNF    Current Diagnoses: Patient Active Problem List   Diagnosis Date Noted  . Pressure injury of skin 12/16/2016  . Acute renal failure (ARF) (HCC) 12/15/2016  . Anemia 12/15/2016  . 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 Hyponatremia 04/17/2016    Orientation RESPIRATION BLADDER Height & Weight     Self, Time, Situation, Place    Continent Weight: (!) 324 lb 12.8 oz (147.3 kg) Height:  6\' 4"  (193 cm)  BEHAVIORAL SYMPTOMS/MOOD NEUROLOGICAL BOWEL NUTRITION STATUS      Continent    AMBULATORY STATUS COMMUNICATION OF NEEDS Skin   Extensive Assist Verbally Normal                       Personal Care Assistance Level of Assistance  Bathing, Dressing Bathing Assistance: Limited assistance   Dressing Assistance: Limited assistance     Functional Limitations Info             SPECIAL CARE FACTORS FREQUENCY  PT (By licensed PT)     PT Frequency: Up to 5X per day, 5X per week              Contractures Contractures Info: Present    Additional Factors Info  Allergies   Allergies Info: Clindamycin/lincomycin           Current Medications (12/19/2016):   This is the current hospital active medication list Current Facility-Administered Medications  Medication Dose Route Frequency Provider Last Rate Last Dose  . apixaban (ELIQUIS) tablet 5 mg  5 mg Oral BID Valentina GuScott D Christy, RPH   5 mg at 12/19/16 1003  . folic acid (FOLVITE) tablet 1 mg  1 mg Oral Daily Rohini Alger Memoseddy Vanga, MD   1 mg at 12/19/16 1536  . sodium chloride flush (NS) 0.9 % injection 3 mL  3 mL Intravenous Q12H Altamese DillingVaibhavkumar Vachhani, MD   3 mL at 12/19/16 1006  . sotalol (BETAPACE) tablet 120 mg  120 mg Oral BID Altamese DillingVaibhavkumar Vachhani, MD   120 mg at 12/19/16 1003     Discharge Medications: Please see discharge summary for a list of discharge medications.  Relevant Imaging Results:  Relevant Lab Results:   Additional Information SS# [id number]  Judi CongKaren M Lucia Harm, LCSW

## 2016-12-20 ENCOUNTER — Encounter: Payer: Self-pay | Admitting: Gastroenterology

## 2016-12-20 LAB — BASIC METABOLIC PANEL
Anion gap: 4 — ABNORMAL LOW (ref 5–15)
BUN: 29 mg/dL — AB (ref 6–20)
CHLORIDE: 100 mmol/L — AB (ref 101–111)
CO2: 28 mmol/L (ref 22–32)
Calcium: 7.6 mg/dL — ABNORMAL LOW (ref 8.9–10.3)
Creatinine, Ser: 1.35 mg/dL — ABNORMAL HIGH (ref 0.61–1.24)
GFR calc Af Amer: >60 mL/min (ref >60)
GFR calc non Af Amer: 57 mL/min — ABNORMAL LOW (ref >60)
Glucose, Bld: 115 mg/dL — ABNORMAL HIGH (ref 65–99)
Potassium: 3.8 mmol/L (ref 3.5–5.1)
SODIUM: 132 mmol/L — AB (ref 135–145)

## 2016-12-20 LAB — CYTOLOGY - NON PAP

## 2016-12-20 LAB — CBC
HCT: 24.7 % — ABNORMAL LOW (ref 40.0–52.0)
HEMOGLOBIN: 8.5 g/dL — AB (ref 13.0–18.0)
MCH: 35.1 pg — ABNORMAL HIGH (ref 26.0–34.0)
MCHC: 34.4 g/dL (ref 32.0–36.0)
MCV: 102 fL — ABNORMAL HIGH (ref 80.0–100.0)
Platelets: 72 10*3/uL — ABNORMAL LOW (ref 150–440)
RBC: 2.42 MIL/uL — ABNORMAL LOW (ref 4.40–5.90)
RDW: 17.1 % — ABNORMAL HIGH (ref 11.5–14.5)
WBC: 10.6 10*3/uL (ref 3.8–10.6)

## 2016-12-20 LAB — BODY FLUID CULTURE: CULTURE: NO GROWTH

## 2016-12-20 LAB — HEPATITIS B SURFACE ANTIGEN: HEP B S AG: NEGATIVE

## 2016-12-20 LAB — HEPATITIS C ANTIBODY: HCV Ab: <0.1 s/co ratio (ref 0.0–0.9)

## 2016-12-20 LAB — FERRITIN: FERRITIN: 696 ng/mL — AB (ref 24–336)

## 2016-12-20 LAB — HEPATITIS B SURFACE ANTIBODY, QUANTITATIVE: Hepatitis B-Post: 179.7 m[IU]/mL (ref >9.9)

## 2016-12-20 LAB — HEPATITIS B CORE ANTIBODY, TOTAL: HEP B C TOTAL AB: NEGATIVE

## 2016-12-20 MED ORDER — LOSARTAN POTASSIUM 25 MG PO TABS
25.0000 mg | ORAL_TABLET | Freq: Every day | ORAL | Status: DC
  Administered 2016-12-20: 25 mg via ORAL
  Filled 2016-12-20: qty 1

## 2016-12-20 NOTE — Progress Notes (Signed)
Sacral wound dressing changed.  Would has moderate amount of brown foul smelling drainage. Area is surrounded by edematous tissues. Groin and buttock have maceration, buttock has superficially open skin.  Most of this skin is covered with the sacral foam dressing. Patient says he does not like to lay on his side, he can't sleep that way.  Reinforced the importance of him laying in a way that he is off his tail bone for the majority of the day.

## 2016-12-20 NOTE — Progress Notes (Signed)
Patient ID: [name], male   DOB: [birth year], 56 y.o.   MRN: [id number]  [employer] PROGRESS NOTE  [name] RouteMichael J Ent MWN:[id number] DOB: [birth year] DOA: 12/15/2016 PCP: Corky DownsMASOUD,[name], MD  HPI/Subjective: Patient feeling very weak. Refusing rehab, BP low  Objective: Vitals:   12/20/16 1130 12/20/16 1923  BP: (!) 85/53 (!) 88/60  Pulse: 80 78  Resp: 18 18  Temp: 97.9 F (36.6 C) 99.5 F (37.5 C)    Filed Weights   12/15/16 1304 12/15/16 2055 12/17/16 0416  Weight: (!) 140.6 kg (310 lb) (!) 143.2 kg (315 lb 11.2 oz) (!) 147.3 kg (324 lb 12.8 oz)    ROS: Review of Systems  Constitutional: Negative for chills and fever.  Eyes: Negative for blurred vision.  Respiratory: Negative for cough and shortness of breath.   Cardiovascular: Negative for chest pain.  Gastrointestinal: Negative for abdominal pain, constipation, diarrhea, nausea and vomiting.  Genitourinary: Negative for dysuria.  Musculoskeletal: Negative for joint pain.  Neurological: Negative for dizziness and headaches.   Exam: Physical Exam  Constitutional: He is oriented to person, place, and time.  HENT:  Nose: No mucosal edema.  Mouth/Throat: No oropharyngeal exudate or posterior oropharyngeal edema.  Eyes: Conjunctivae, EOM and lids are normal. Pupils are equal, round, and reactive to light.  Neck: No JVD present. Carotid bruit is not present. No edema present. No thyroid mass and no thyromegaly present.  Cardiovascular: S1 normal and S2 normal.  Exam reveals no gallop.   Murmur heard.  Systolic murmur is present with a grade of 2/6  Pulses:      Dorsalis pedis pulses are 2+ on the right side, and 2+ on the left side.  Respiratory: No respiratory distress. He has no wheezes. He has no rhonchi. He has no rales.  GI: Soft. Bowel sounds are normal. There is no tenderness.  Musculoskeletal:       Right ankle: He exhibits swelling.       Left ankle: He exhibits swelling.  Lymphadenopathy:    He has  no cervical adenopathy.  Neurological: He is alert and oriented to person, place, and time. No cranial nerve deficit.  Skin: Skin is warm. No rash noted. Nails show no clubbing.  Psychiatric: He has a normal mood and affect.      Data Reviewed: Basic Metabolic Panel:  Recent Labs Lab 12/16/16 0512 12/17/16 0314 12/18/16 0420 12/19/16 0413 12/20/16 0415  NA 130* 130* 132* 134* 132*  K 4.6 4.4 4.1 4.0 3.8  CL 96* 97* 97* 100* 100*  CO2 29 28 29 30 28   GLUCOSE 96 99 108* 110* 115*  BUN 56* 53* 45* 35* 29*  CREATININE 3.24* 3.02* 2.13* 1.59* 1.35*  CALCIUM 7.6* 7.2* 7.8* 7.8* 7.6*   Liver Function Tests:  Recent Labs Lab 12/16/16 1058 12/18/16 0420  AST 48* 38  ALT 21 19  ALKPHOS 77 74  BILITOT 1.6* 1.9*  PROT 5.4* 5.6*  ALBUMIN 2.1* 2.2*   CBC:  Recent Labs Lab 12/16/16 0512 12/17/16 0314 12/18/16 0420 12/19/16 0413 12/20/16 0415  WBC 6.2 6.0 7.7 8.9 10.6  HGB 7.6* 7.1* 8.6* 8.7* 8.5*  HCT 22.0* 20.4* 25.3* 25.3* 24.7*  MCV 104.4* 104.3* 102.3* 102.7* 102.0*  PLT 70* 67* 70* 72* 72*   Cardiac Enzymes:  Recent Labs Lab 12/15/16 1307  TROPONINI 0.05*    Recent Results (from the past 240 hour(s))  Body fluid culture     Status: None   Collection Time: 12/17/16  1:15 PM  Result Value Ref Range Status   Specimen Description PERITONEAL  Final   Special Requests NONE  Final   Gram Stain   Final    FEW WBC PRESENT,BOTH PMN AND MONONUCLEAR NO ORGANISMS SEEN    Culture   Final    No growth aerobically or anaerobically. Performed at [facility]    Report Status 12/20/2016 FINAL  Final      Scheduled Meds: . apixaban  5 mg Oral BID  . folic acid  1 mg Oral Daily  . losartan  25 mg Oral Daily  . sodium chloride flush  3 mL Intravenous Q12H  . sotalol  120 mg Oral BID    Assessment/Plan:  1. Acute kidney injury. Likely hypovolemic and ATN secondary to hypotension. Holding Lasix and benazepril and Naprosyn. Creatinine slowly improving.  Hold off on fluids. 2. Hypotension. Hold off on IV fluids and antihypertensive medications at this point. Cardio started losartan and sotalol but BP low so will have to hold 3. Gastrointestinal bleed with acute hemorrhagic anemia. Portal gastropathy seen on EGD. Continue to watch hemoglobin. Gastroenterologist ok to restart anticoagulation. 4. Paroxysmal atrial fibrillation. Eliquis started 1/7, watch closely for bleeding. BP low to tolerate sotalol. 5. Liver cirrhosis secondary to alcohol abuse. hepatitis profiles neg, may need prophylactic SBP prophylaxis. 6. Ascites status post paracentesis of 6.2 L. 7. Coagulopathy secondary to liver disease and Xarelto. Patient on vitamin K.  Code Status:     Code Status Orders        Start     Ordered   12/15/16 2007  Full code  Continuous     12/15/16 2006    Code Status History    Date Active Date Inactive Code Status Order ID Comments User Context   04/17/2016  7:26 PM 04/19/2016  5:50 PM Full Code [id number]  Wyatt Haste, MD ED     Family Communication: Family today Disposition Plan: Last physical therapy note recommended rehabilitation but pt and family refused  Consultants:  Gastroenterology  Cardiology  Procedures:  Paracentesis  Endoscopy  Time spent: 25 minutes  Lusero Nordlund [employer]

## 2016-12-20 NOTE — Progress Notes (Signed)
SUBJECTIVE: patient is feeling much better no chest pain or shortness of breath   Vitals:   12/19/16 1001 12/19/16 1122 12/19/16 1922 12/20/16 0559  BP: 91/68 (!) 95/53 103/74 (!) 131/102  Pulse: 72 67 70 66  Resp:  17 16 18   Temp:  98.3 F (36.8 C) 98.7 F (37.1 C) 98 F (36.7 C)  TempSrc:  Oral Oral   SpO2: 98% 100% 98% 98%  Weight:      Height:        Intake/Output Summary (Last 24 hours) at 12/20/16 0852 Last data filed at 12/19/16 2148  Gross per 24 hour  Intake              600 ml  Output              650 ml  Net              -50 ml    LABS: Basic Metabolic Panel:  Recent Labs  16/09/9600/07/18 0413 12/20/16 0415  NA 134* 132*  K 4.0 3.8  CL 100* 100*  CO2 30 28  GLUCOSE 110* 115*  BUN 35* 29*  CREATININE 1.59* 1.35*  CALCIUM 7.8* 7.6*   Liver Function Tests:  Recent Labs  12/18/16 0420  AST 38  ALT 19  ALKPHOS 74  BILITOT 1.9*  PROT 5.6*  ALBUMIN 2.2*   No results for input(s): LIPASE, AMYLASE in the last 72 hours. CBC:  Recent Labs  12/19/16 0413 12/20/16 0415  WBC 8.9 10.6  HGB 8.7* 8.5*  HCT 25.3* 24.7*  MCV 102.7* 102.0*  PLT 72* 72*   Cardiac Enzymes: No results for input(s): CKTOTAL, CKMB, CKMBINDEX, TROPONINI in the last 72 hours. BNP: Invalid input(s): POCBNP D-Dimer: No results for input(s): DDIMER in the last 72 hours. Hemoglobin A1C: No results for input(s): HGBA1C in the last 72 hours. Fasting Lipid Panel: No results for input(s): CHOL, HDL, LDLCALC, TRIG, CHOLHDL, LDLDIRECT in the last 72 hours. Thyroid Function Tests: No results for input(s): TSH, T4TOTAL, T3FREE, THYROIDAB in the last 72 hours.  Invalid input(s): FREET3 Anemia Panel:  Recent Labs  12/20/16 0415  FERRITIN 696*     PHYSICAL EXAM General: Well developed, well nourished, in no acute distress HEENT:  Normocephalic and atramatic Neck:  No JVD.  Lungs: Clear bilaterally to auscultation and percussion. Heart: HRRR . Normal S1 and S2 without gallops  or murmurs.  Abdomen: Bowel sounds are positive, abdomen soft and non-tender  Msk:  Back normal, normal gait. Normal strength and tone for age. Extremities: No clubbing, cyanosis or edema.   Neuro: Alert and oriented X 3. Psych:  Good affect, responds appropriately  TELEMETRY: sinus rhythm  ASSESSMENT AND PLAN: acute renal failure/CHF with left ventricular ejection fraction 45%. Patient is feeling much better now after dialysis and creatinine has come down to 1.32.we will start the patient on losartan today.  Principal Problem:   Acute renal failure (ARF) (HCC) Active Problems:   Anemia   Pressure injury of skin    KHAN,SHAUKAT A, MD, [facility] 12/20/2016 8:52 AM

## 2016-12-20 NOTE — Op Note (Signed)
[facility] Gastroenterology Patient Name: [name] Procedure Date: 12/18/2016 7:45 AM MRN: [id number] Account #: [id number] Date of Birth: [birth year] Admit Type: Inpatient Age: 56 Room: [facility] ENDO ROOM 4 Gender: Male Note Status: Finalized Procedure:            Upper GI endoscopy Indications:          Iron deficiency anemia secondary to chronic blood loss,                        cirrhosis of liver Providers:            Lin Landsman Medicines:            Monitored Anesthesia Care Complications:        No immediate complications. Procedure:            Pre-Anesthesia Assessment:                       - Prior to the procedure, a History and Physical was                        performed, and patient medications and allergies were                        reviewed. The patient is competent. The risks and                        benefits of the procedure and the sedation options and                        risks were discussed with the patient. All questions                        were answered and informed consent was obtained.                        Patient identification and proposed procedure were                        verified by the physician, the nurse, the                        anesthesiologist, the anesthetist and the technician in                        the pre-procedure area in the procedure room. Mental                        Status Examination: alert and oriented. Airway                        Examination: normal oropharyngeal airway and neck                        mobility. Respiratory Examination: clear to                        auscultation. CV Examination: normal. Prophylactic                        Antibiotics: The patient does not  require prophylactic                        antibiotics. Prior Anticoagulants: The patient has                        taken no previous anticoagulant or antiplatelet agents.                        ASA Grade  Assessment: III - A patient with severe                        systemic disease. After reviewing the risks and                        benefits, the patient was deemed in satisfactory                        condition to undergo the procedure. The anesthesia plan                        was to use monitored anesthesia care (MAC). Immediately                        prior to administration of medications, the patient was                        re-assessed for adequacy to receive sedatives. The                        heart rate, respiratory rate, oxygen saturations, blood                        pressure, adequacy of pulmonary ventilation, and                        response to care were monitored throughout the                        procedure. The physical status of the patient was                        re-assessed after the procedure.                       After obtaining informed consent, the endoscope was                        passed under direct vision. Throughout the procedure,                        the patient's blood pressure, pulse, and oxygen                        saturations were monitored continuously. The Endoscope                        was introduced through the mouth, and advanced to the                        second part of  duodenum. The Endoscope was introduced                        through the and advanced to the. The upper GI endoscopy                        was accomplished without difficulty. The patient                        tolerated the procedure well. Findings:      The duodenal bulb and second portion of the duodenum were normal.      Moderate portal hypertensive gastropathy was found in the entire       examined stomach. No gastric varices detected      Esophagogastric landmarks were identified: the gastroesophageal junction       was found at 41 cm from the incisors.      The examined esophagus was normal. No esophageal varices detected Impression:           -  Normal duodenal bulb and second portion of the                        duodenum.                       - Portal hypertensive gastropathy.                       - Esophagogastric landmarks identified.                       - Normal esophagus.                       - No specimens collected. Recommendation:       - Return patient to hospital ward for ongoing care.                       - Advance diet as tolerated.                       - Can stop IV PPI                       - Continue present medications.                       - Resume Xarelto (rivaroxaban) at prior dose today.                        Refer to primary physician for further adjustment of                        therapy. Procedure Code(s):    --- Professional ---                       [id number], Esophagogastroduodenoscopy, flexible, transoral;                        diagnostic, including collection of specimen(s) by                        brushing or washing, when performed (separate procedure) Diagnosis  Code(s):    --- Professional ---                       K76.6, Portal hypertension                       K31.89, Other diseases of stomach and duodenum                       D50.0, Iron deficiency anemia secondary to blood loss                        (chronic) CPT copyright 2016 American Medical Association. All rights reserved. The codes documented in this report are preliminary and upon coder review may  be revised to meet current compliance requirements. [place],  12/20/2016 4:32:42 PM Number of Addenda: 0 Note Initiated On: 12/18/2016 7:45 AM      [facility]

## 2016-12-20 NOTE — Clinical Social Work Note (Signed)
CSW received referral for SNF.  Case discussed with case manager and plan is to discharge home with home health.  CSW to sign off please re-consult if social work needs arise.  Abiageal Blowe R. Anatole Apollo, MSW, LCSWA [phone]   

## 2016-12-20 NOTE — Care Management (Signed)
Patient is declining physical therapy recommendation of skilled nursing placement.  he is agreeable to home health nurse and physical therapy.  Says he has access to a front wheeled rolling walker.  He is firm in his statement that he will be able to get from the car to inside his house.  Has to navigate one step up.  No agency preference.  Need to find agency that is in network.  [facility]

## 2016-12-21 LAB — CBC
HCT: 26.8 % — ABNORMAL LOW (ref 40.0–52.0)
Hemoglobin: 9 g/dL — ABNORMAL LOW (ref 13.0–18.0)
MCH: 34.1 pg — AB (ref 26.0–34.0)
MCHC: 33.5 g/dL (ref 32.0–36.0)
MCV: 101.7 fL — AB (ref 80.0–100.0)
PLATELETS: 81 10*3/uL — AB (ref 150–440)
RBC: 2.64 MIL/uL — ABNORMAL LOW (ref 4.40–5.90)
RDW: 17.1 % — AB (ref 11.5–14.5)
WBC: 12.6 10*3/uL — ABNORMAL HIGH (ref 3.8–10.6)

## 2016-12-21 LAB — BASIC METABOLIC PANEL
Anion gap: 7 (ref 5–15)
BUN: 27 mg/dL — AB (ref 6–20)
CALCIUM: 7.8 mg/dL — AB (ref 8.9–10.3)
CO2: 27 mmol/L (ref 22–32)
Chloride: 99 mmol/L — ABNORMAL LOW (ref 101–111)
Creatinine, Ser: 1.04 mg/dL (ref 0.61–1.24)
GFR calc Af Amer: >60 mL/min (ref >60)
GLUCOSE: 109 mg/dL — AB (ref 65–99)
Potassium: 3.7 mmol/L (ref 3.5–5.1)
Sodium: 133 mmol/L — ABNORMAL LOW (ref 135–145)

## 2016-12-21 MED ORDER — ONDANSETRON HCL 4 MG/2ML IJ SOLN: 4.0000 mg | Freq: Once | INTRAMUSCULAR | Status: DC | PRN

## 2016-12-21 MED ORDER — FENTANYL CITRATE (PF) 100 MCG/2ML IJ SOLN: 25.0000 ug | INTRAMUSCULAR | Status: DC | PRN

## 2016-12-21 MED ORDER — SOTALOL HCL 80 MG PO TABS: 80.0000 mg | ORAL_TABLET | Freq: Two times a day (BID) | ORAL | 0 refills | Status: AC

## 2016-12-21 MED ORDER — FOLIC ACID 1 MG PO TABS: 1.0000 mg | ORAL_TABLET | Freq: Every day | ORAL | 0 refills | Status: AC

## 2016-12-21 MED ORDER — APIXABAN 5 MG PO TABS: 5.0000 mg | ORAL_TABLET | Freq: Two times a day (BID) | ORAL | 0 refills | Status: DC

## 2016-12-21 MED ORDER — BENAZEPRIL HCL 40 MG PO TABS: 40.0000 mg | ORAL_TABLET | Freq: Every day | ORAL | 0 refills | Status: AC

## 2016-12-21 NOTE — Discharge Instructions (Signed)
Acute Kidney Injury, Adult °Acute kidney injury (AKI) occurs when there is sudden (acute) damage to the kidneys. A small amount of kidney damage may not cause problems, but a large amount of damage may make it difficult or impossible for the kidneys to work the way they should. AKI may develop into long-lasting (chronic) kidney disease. Early detection and treatment of AKI may prevent kidney damage from becoming permanent or getting worse. °What are the causes? °Common causes of this condition include: °· A problem with blood flow to the kidneys. This may be caused by: °¨ Blood loss. °¨ Heart and blood vessel (cardiovascular) disease. °¨ Severe burns. °¨ Liver disease. °· Direct damage to the kidneys. This may be caused by: °¨ Some medicines. °¨ A kidney infection. °¨ Poisoning. °¨ Being around or in contact with poisonous (toxic) substances. °¨ A surgical wound. °¨ A hard, direct force to the kidney area. °· A sudden blockage of urine flow. This may be caused by: °¨ Cancer. °¨ Kidney stones. °¨ Enlarged prostate in males. °What are the signs or symptoms? °Symptoms develop slowly and may not be obvious until the kidney damage becomes severe. It is possible to have AKI for years without showing any symptoms. Symptoms of this condition can include: °· Swelling (edema) of the face, legs, ankles, or feet. °· Numbness, tingling, or loss of feeling (sensation) in the hands or feet. °· Tiredness (lethargy). °· Nausea or vomiting. °· Confusion or trouble concentrating. °· Problems with urination, such as: °¨ Painful or burning feeling during urination. °¨ Decreased urine production. °¨ Frequent urination, especially at night. °¨ Bloody urine. °· Muscle twitches and cramps, especially in the legs. °· Shortness of breath. °· Weakness. °· Constant itchiness. °· Loss of appetite. °· Metallic taste in the mouth. °· Trouble sleeping. °· Pale lining of the eyelids and surface of the eye (conjunctiva). °How is this diagnosed? °This  condition may be diagnosed with various tests. Tests may include: °· Blood tests. °· Urine tests. °· Imaging tests. °· A test in which a sample of tissue is removed from the kidneys to be looked at under a microscope (kidney biopsy). °How is this treated? °Treatment of AKI varies depending on the cause and severity of the kidney damage. In mild cases, treatment may not be needed. The kidneys may heal on their own. °If AKI is more severe, your health care provider will treat the cause of the kidney damage, help the kidneys heal, and prevent problems from occurring. Severe cases may require a procedure to remove toxic wastes from the body (dialysis) or surgery to repair kidney damage. Surgery may involve: °· Repair of a torn kidney. °· Removal of a urine flow obstruction. °Follow these instructions at home: °· Follow your prescribed diet. °· Take over-the-counter and prescription medicines only as told by your health care provider. °¨ Do not take any new medicines unless approved by your health care provider. Many medicines can worsen your kidney damage. °¨ Do not take any vitamin and mineral supplements unless approved by your health care provider. Many nutritional supplements can worsen your kidney damage. °¨ The dose of some medicines that you take may need to be adjusted. °· Do not use any tobacco products, such as cigarettes, chewing tobacco, and e-cigarettes. If you need help quitting, ask your health care provider. °· Keep all follow-up visits as told by your health care provider. This is important. °· Keep track of your blood pressure. Report changes in your blood pressure as told by your   health care provider. °· Achieve and maintain a healthy weight. If you need help with this, ask your health care provider. °· Start or continue an exercise plan. Try to exercise at least 30 minutes a day, 5 days a week. °· Stay current with immunizations as told by your health care provider. °Where to find more  information: °· American Association of Kidney Patients: www.aakp.org °· National Kidney Foundation: www.kidney.org °· American Kidney Fund: www.akfinc.org °· [employer] [facility]: www.lifeoptions.org and www.kidneyschool.org °Contact a health care provider if: °· Your symptoms get worse. °· You develop new symptoms. °Get help right away if: °· You develop symptoms of end-stage kidney disease, which include: °¨ Headaches. °¨ Abnormally dark or light skin. °¨ Numbness in the hands or feet. °¨ Easy bruising. °¨ Frequent hiccups. °¨ Chest pain. °¨ Shortness of breath. °¨ End of menstruation in women. °· You have a fever. °· You have decreased urine production. °· You have pain or bleeding when you urinate. °This information is not intended to replace advice given to you by your health care provider. Make sure you discuss any questions you have with your health care provider. °Document Released: 06/14/2011 Document Revised: 07/08/2016 Document Reviewed: 07/28/2012 °Elsevier Interactive Patient Education © 2017 Else[employer] Inc. ° ° °

## 2016-12-21 NOTE — Care Management (Signed)
patient for discharge home today with home health SN and PT through [facility].  Patient is firm in his statement that he will not have any problems getting into his house and firm in his decision not to pursue skilled nursing placement.  Notified [facility].  [facility] is in network with patient's insurance.  Provided patient with Eliquis coupons for trial and copay assist

## 2016-12-21 NOTE — Progress Notes (Signed)
SUBJECTIVE: Patient is feeling much better no chest pain or shortness of breath   Vitals:   12/20/16 1130 12/20/16 1923 12/20/16 2034 12/21/16 0353  BP: (!) 85/53 (!) 88/60 95/63 (!) 101/56  Pulse: 80 78 70 72  Resp: 18 18  15   Temp: 97.9 F (36.6 C) 99.5 F (37.5 C)    TempSrc: Oral Oral    SpO2: 98% 96%  99%  Weight:      Height:        Intake/Output Summary (Last 24 hours) at 12/21/16 0843 Last data filed at 12/21/16 0500  Gross per 24 hour  Intake                3 ml  Output              350 ml  Net             -347 ml    LABS: Basic Metabolic Panel:  Recent Labs  16/09/9600/08/18 0415 12/21/16 0540  NA 132* 133*  K 3.8 3.7  CL 100* 99*  CO2 28 27  GLUCOSE 115* 109*  BUN 29* 27*  CREATININE 1.35* 1.04  CALCIUM 7.6* 7.8*   Liver Function Tests: No results for input(s): AST, ALT, ALKPHOS, BILITOT, PROT, ALBUMIN in the last 72 hours. No results for input(s): LIPASE, AMYLASE in the last 72 hours. CBC:  Recent Labs  12/20/16 0415 12/21/16 0540  WBC 10.6 12.6*  HGB 8.5* 9.0*  HCT 24.7* 26.8*  MCV 102.0* 101.7*  PLT 72* 81*   Cardiac Enzymes: No results for input(s): CKTOTAL, CKMB, CKMBINDEX, TROPONINI in the last 72 hours. BNP: Invalid input(s): POCBNP D-Dimer: No results for input(s): DDIMER in the last 72 hours. Hemoglobin A1C: No results for input(s): HGBA1C in the last 72 hours. Fasting Lipid Panel: No results for input(s): CHOL, HDL, LDLCALC, TRIG, CHOLHDL, LDLDIRECT in the last 72 hours. Thyroid Function Tests: No results for input(s): TSH, T4TOTAL, T3FREE, THYROIDAB in the last 72 hours.  Invalid input(s): FREET3 Anemia Panel:  Recent Labs  12/20/16 0415  FERRITIN 696*     PHYSICAL EXAM General: Well developed, well nourished, in no acute distress HEENT:  Normocephalic and atramatic Neck:  No JVD.  Lungs: Clear bilaterally to auscultation and percussion. Heart: HRRR . Normal S1 and S2 without gallops or murmurs.  Abdomen: Bowel sounds  are positive, abdomen soft and non-tender  Msk:  Back normal, normal gait. Normal strength and tone for age. Extremities: No clubbing, cyanosis or edema.   Neuro: Alert and oriented X 3. Psych:  Good affect, responds appropriately  TELEMETRY: Sinus rhythm  ASSESSMENT AND PLAN: Status post congestive heart failure with acute renal failure. Patient is still hypotensive with blood pressure systolic 101, thus losartan was stopped. Advise follow-up in the office Friday 10:00.  Principal Problem:   Acute renal failure (ARF) (HCC) Active Problems:   Anemia   Pressure injury of skin    Latifah Padin A, MD, [facility] Center[facility] 12/21/2016 8:43 AM

## 2016-12-21 NOTE — Care Management (Signed)
Added wound care orders to home health orders.

## 2016-12-21 NOTE — Progress Notes (Addendum)
Dressing changed prior to discharge.  Continues to have brown drainage, with foul odor.  Instructed wife how to change the dressing and the importance of changing the dressing when it gets wet or soiled.  Stressed need for barrier cream around groin and uncover parts of his buttock.  Discharge to home with home health services.  Typed up the wound care instructions for the wife to use as a guide.

## 2016-12-24 NOTE — Discharge Summary (Signed)
[facility] [place]   PATIENT NAME: [name]    MR#:  [id number]  DATE OF BIRTH:  18-Sep-1961  DATE OF ADMISSION:  12/15/2016   ADMITTING PHYSICIAN: Altamese Dilling, MD  DATE OF DISCHARGE: 12/21/2016  1:24 PM  PRIMARY CARE PHYSICIAN: MASOUD,[name], MD   ADMISSION DIAGNOSIS:  Weakness [R53.1] Acute renal failure, unspecified acute renal failure type (HCC) [N17.9] Anemia, unspecified type [D64.9] DISCHARGE DIAGNOSIS:  Principal Problem:   Acute renal failure (ARF) (HCC) Active Problems:   Anemia   Pressure injury of skin  SECONDARY DIAGNOSIS:   Past Medical History:  Diagnosis Date  . Anal fissure   . Atrial fibrillation (HCC)   . CHF (congestive heart failure) (HCC)   . Heart murmur   . Hypertension   . Lymphadenopathy   . Personal history of colonic polyps    HOSPITAL COURSE:  56 y.o. male with a known history of Atrial fibrillation, CHF, hypertension, lymphadenopathy has chronic swelling on his legs, which was getting worse and was admitted for same.  1. Acute kidney injury. Likely hypovolemic and ATN secondary to hypotension. Held Lasix, benazepril and Naprosyn. Creatinine improved.  2. Hypotension: unable to titrate any BP/cardiac meds due to hypotension. 3. Gastrointestinal bleed with acute hemorrhagic anemia. Portal gastropathy seen on EGD. Gastroenterologist ok to restart anticoagulation. 4. Paroxysmal atrial fibrillation. Eliquis started 1/7, will need to monitor for any bleeding. BP low to tolerate sotalol or any other cardiac meds. 5. Liver cirrhosis secondary to alcohol abuse. hepatitis profiles neg. Ascites status post paracentesis of 6.2 L. 6. Coagulopathy secondary to liver disease and Xarelto.  DISCHARGE CONDITIONS:  stable CONSULTS OBTAINED:  Treatment Team:  Laurier Nancy, MD Wyline Mood, MD DRUG ALLERGIES:   Allergies  Allergen Reactions  . Clindamycin/Lincomycin Diarrhea   DISCHARGE MEDICATIONS:    Allergies as of 12/21/2016      Reactions   Clindamycin/lincomycin Diarrhea      Medication List    STOP taking these medications   XARELTO 20 MG Tabs tablet Generic drug:  rivaroxaban     TAKE these medications   ALEVE 220 MG tablet Generic drug:  naproxen sodium Take 440 mg by mouth 2 (two) times daily with a meal.   apixaban 5 MG Tabs tablet Commonly known as:  ELIQUIS Take 1 tablet (5 mg total) by mouth 2 (two) times daily.   benazepril 40 MG tablet Commonly known as:  LOTENSIN Take 1 tablet (40 mg total) by mouth daily.   folic acid 1 MG tablet Commonly known as:  FOLVITE Take 1 tablet (1 mg total) by mouth daily.   furosemide 40 MG tablet Commonly known as:  LASIX Take 40 mg by mouth daily.   potassium chloride SA 20 MEQ tablet Commonly known as:  K-DUR,KLOR-CON Take 10 mEq by mouth daily.   sotalol 80 MG tablet Commonly known as:  BETAPACE Take 1 tablet (80 mg total) by mouth 2 (two) times daily. What changed:  how much to take      DISCHARGE INSTRUCTIONS:   DIET:  Regular diet DISCHARGE CONDITION:  Good ACTIVITY:  Activity as tolerated OXYGEN:  Home Oxygen: No.  Oxygen Delivery: room air DISCHARGE LOCATION:  home   If you experience worsening of your admission symptoms, develop shortness of breath, life threatening emergency, suicidal or homicidal thoughts you must seek medical attention immediately by calling 911 or calling your MD immediately  if symptoms less severe.  You Must read complete instructions/literature along with  all the possible adverse reactions/side effects for all the Medicines you take and that have been prescribed to you. Take any new Medicines after you have completely understood and accpet all the possible adverse reactions/side effects.   Please note  You were cared for by a hospitalist during your hospital stay. If you have any questions about your discharge medications or the care you received while you were in the  hospital after you are discharged, you can call the unit and asked to speak with the hospitalist on call if the hospitalist that took care of you is not available. Once you are discharged, your primary care physician will handle any further medical issues. Please note that NO REFILLS for any discharge medications will be authorized once you are discharged, as it is imperative that you return to your primary care physician (or establish a relationship with a primary care physician if you do not have one) for your aftercare needs so that they can reassess your need for medications and monitor your lab values.    On the day of Discharge:  VITAL SIGNS:  Blood pressure 102/72, pulse 87, temperature 98.2 F (36.8 C), temperature source Oral, resp. rate (!) 22, height 6\' 4"  (1.93 m), weight (!) 147.3 kg (324 lb 12.8 oz), SpO2 96 %. PHYSICAL EXAMINATION:  GENERAL:  56 y.o.-year-old patient lying in the bed with no acute distress.  EYES: Pupils equal, round, reactive to light and accommodation. No scleral icterus. Extraocular muscles intact.  HEENT: Head atraumatic, normocephalic. Oropharynx and nasopharynx clear.  NECK:  Supple, no jugular venous distention. No thyroid enlargement, no tenderness.  LUNGS: Normal breath sounds bilaterally, no wheezing, rales,rhonchi or crepitation. No use of accessory muscles of respiration.  CARDIOVASCULAR: S1, S2 normal. No murmurs, rubs, or gallops.  ABDOMEN: Soft, non-tender, non-distended. Bowel sounds present. No organomegaly or mass.  EXTREMITIES: No pedal edema, cyanosis, or clubbing.  NEUROLOGIC: Cranial nerves II through XII are intact. Muscle strength 5/5 in all extremities. Sensation intact. Gait not checked.  PSYCHIATRIC: The patient is alert and oriented x 3.  SKIN: No obvious rash, lesion, or ulcer.  DATA REVIEW:   CBC  Recent Labs Lab 12/21/16 0540  WBC 12.6*  HGB 9.0*  HCT 26.8*  PLT 81*    Chemistries   Recent Labs Lab 12/18/16 0420   12/21/16 0540  NA 132*  < > 133*  K 4.1  < > 3.7  CL 97*  < > 99*  CO2 29  < > 27  GLUCOSE 108*  < > 109*  BUN 45*  < > 27*  CREATININE 2.13*  < > 1.04  CALCIUM 7.8*  < > 7.8*  AST 38  --   --   ALT 19  --   --   ALKPHOS 74  --   --   BILITOT 1.9*  --   --   < > = values in this interval not displayed.   Follow-up Information    MASOUD,[name], MD. Schedule an appointment as soon as possible for a visit in 1 week(s).   Specialty:  Internal Medicine Why:  Tuesday, January 16th at 1130am, ccs Contact information: [address] [place] [address] [phone]        [facility] Follow up.   Why:  Nurse and physical therapy.  will call to set up initial visit Contact information: [address] [place] [address] [phone]           Management plans discussed  with the patient, family and they are in agreement.  CODE STATUS: FULL CODE  TOTAL TIME TAKING CARE OF THIS PATIENT: 45 minutes.    Delfino LovettVipul Willine Schwalbe M.D on 12/24/2016 at 6:21 PM  Between 7am to 6pm - Pager - [phone]  After 6pm go to [email or web address] - Social research officer, governmentpassword [email or web address]  [facility]  Office  [phone]  CC: Primary care physician; Corky DownsMASOUD,[name], MD   Note: This dictation was prepared with Dragon dictation along with smaller phrase technology. Any transcriptional errors that result from this process are unintentional.

## 2016-12-26 ENCOUNTER — Inpatient Hospital Stay
Admission: EM | Admit: 2016-12-26 | Discharge: 2017-01-14 | DRG: 853 | Disposition: A | Discharge status: Skilled Nursing Facility | Payer: BC Managed Care – PPO | Attending: Specialist | Admitting: Specialist

## 2016-12-26 ENCOUNTER — Emergency Department: Payer: BC Managed Care – PPO

## 2016-12-26 DIAGNOSIS — K5900 Constipation, unspecified: Secondary | ICD-10-CM | POA: Diagnosis present

## 2016-12-26 DIAGNOSIS — E669 Obesity, unspecified: Secondary | ICD-10-CM | POA: Diagnosis present

## 2016-12-26 DIAGNOSIS — R197 Diarrhea, unspecified: Secondary | ICD-10-CM | POA: Diagnosis not present

## 2016-12-26 DIAGNOSIS — A419 Sepsis, unspecified organism: Principal | ICD-10-CM | POA: Diagnosis present

## 2016-12-26 DIAGNOSIS — G9341 Metabolic encephalopathy: Secondary | ICD-10-CM | POA: Diagnosis present

## 2016-12-26 DIAGNOSIS — R509 Fever, unspecified: Secondary | ICD-10-CM | POA: Diagnosis present

## 2016-12-26 DIAGNOSIS — R591 Generalized enlarged lymph nodes: Secondary | ICD-10-CM | POA: Diagnosis present

## 2016-12-26 DIAGNOSIS — I5042 Chronic combined systolic (congestive) and diastolic (congestive) heart failure: Secondary | ICD-10-CM | POA: Diagnosis present

## 2016-12-26 DIAGNOSIS — Z7901 Long term (current) use of anticoagulants: Secondary | ICD-10-CM

## 2016-12-26 DIAGNOSIS — Z6839 Body mass index (BMI) 39.0-39.9, adult: Secondary | ICD-10-CM

## 2016-12-26 DIAGNOSIS — N170 Acute kidney failure with tubular necrosis: Secondary | ICD-10-CM | POA: Diagnosis present

## 2016-12-26 DIAGNOSIS — Z881 Allergy status to other antibiotic agents status: Secondary | ICD-10-CM

## 2016-12-26 DIAGNOSIS — E440 Moderate protein-calorie malnutrition: Secondary | ICD-10-CM | POA: Insufficient documentation

## 2016-12-26 DIAGNOSIS — I130 Hypertensive heart and chronic kidney disease with heart failure and stage 1 through stage 4 chronic kidney disease, or unspecified chronic kidney disease: Secondary | ICD-10-CM | POA: Diagnosis present

## 2016-12-26 DIAGNOSIS — N183 Chronic kidney disease, stage 3 (moderate): Secondary | ICD-10-CM | POA: Diagnosis present

## 2016-12-26 DIAGNOSIS — J449 Chronic obstructive pulmonary disease, unspecified: Secondary | ICD-10-CM | POA: Diagnosis present

## 2016-12-26 DIAGNOSIS — I9589 Other hypotension: Secondary | ICD-10-CM | POA: Diagnosis present

## 2016-12-26 DIAGNOSIS — Z79899 Other long term (current) drug therapy: Secondary | ICD-10-CM

## 2016-12-26 DIAGNOSIS — M6281 Muscle weakness (generalized): Secondary | ICD-10-CM

## 2016-12-26 DIAGNOSIS — Z8249 Family history of ischemic heart disease and other diseases of the circulatory system: Secondary | ICD-10-CM

## 2016-12-26 DIAGNOSIS — D62 Acute posthemorrhagic anemia: Secondary | ICD-10-CM | POA: Diagnosis present

## 2016-12-26 DIAGNOSIS — E875 Hyperkalemia: Secondary | ICD-10-CM | POA: Diagnosis present

## 2016-12-26 DIAGNOSIS — Z9109 Other allergy status, other than to drugs and biological substances: Secondary | ICD-10-CM

## 2016-12-26 DIAGNOSIS — K7031 Alcoholic cirrhosis of liver with ascites: Secondary | ICD-10-CM

## 2016-12-26 DIAGNOSIS — L89310 Pressure ulcer of right buttock, unstageable: Secondary | ICD-10-CM | POA: Diagnosis present

## 2016-12-26 DIAGNOSIS — T8089XA Other complications following infusion, transfusion and therapeutic injection, initial encounter: Secondary | ICD-10-CM | POA: Diagnosis not present

## 2016-12-26 DIAGNOSIS — F17210 Nicotine dependence, cigarettes, uncomplicated: Secondary | ICD-10-CM | POA: Diagnosis present

## 2016-12-26 DIAGNOSIS — Z23 Encounter for immunization: Secondary | ICD-10-CM

## 2016-12-26 DIAGNOSIS — R262 Difficulty in walking, not elsewhere classified: Secondary | ICD-10-CM

## 2016-12-26 DIAGNOSIS — Z6835 Body mass index (BMI) 35.0-35.9, adult: Secondary | ICD-10-CM

## 2016-12-26 DIAGNOSIS — F4323 Adjustment disorder with mixed anxiety and depressed mood: Secondary | ICD-10-CM

## 2016-12-26 DIAGNOSIS — R4189 Other symptoms and signs involving cognitive functions and awareness: Secondary | ICD-10-CM | POA: Diagnosis present

## 2016-12-26 DIAGNOSIS — L89320 Pressure ulcer of left buttock, unstageable: Secondary | ICD-10-CM | POA: Diagnosis present

## 2016-12-26 DIAGNOSIS — E871 Hypo-osmolality and hyponatremia: Secondary | ICD-10-CM | POA: Diagnosis present

## 2016-12-26 DIAGNOSIS — Z86010 Personal history of colonic polyps: Secondary | ICD-10-CM

## 2016-12-26 DIAGNOSIS — F17200 Nicotine dependence, unspecified, uncomplicated: Secondary | ICD-10-CM | POA: Diagnosis present

## 2016-12-26 DIAGNOSIS — L89153 Pressure ulcer of sacral region, stage 3: Secondary | ICD-10-CM

## 2016-12-26 DIAGNOSIS — R188 Other ascites: Secondary | ICD-10-CM

## 2016-12-26 DIAGNOSIS — I482 Chronic atrial fibrillation: Secondary | ICD-10-CM | POA: Diagnosis present

## 2016-12-26 DIAGNOSIS — R6521 Severe sepsis with septic shock: Secondary | ICD-10-CM | POA: Diagnosis present

## 2016-12-26 DIAGNOSIS — I739 Peripheral vascular disease, unspecified: Secondary | ICD-10-CM | POA: Diagnosis present

## 2016-12-26 DIAGNOSIS — L89150 Pressure ulcer of sacral region, unstageable: Secondary | ICD-10-CM | POA: Diagnosis present

## 2016-12-26 DIAGNOSIS — R578 Other shock: Secondary | ICD-10-CM | POA: Diagnosis present

## 2016-12-26 LAB — CBC WITH DIFFERENTIAL/PLATELET
BASOS PCT: 1 %
Basophils Absolute: 0.1 10*3/uL (ref 0–0.1)
EOS ABS: 0 10*3/uL (ref 0–0.7)
Eosinophils Relative: 0 %
HCT: 31.2 % — ABNORMAL LOW (ref 40.0–52.0)
Hemoglobin: 10.6 g/dL — ABNORMAL LOW (ref 13.0–18.0)
Lymphocytes Relative: 3 %
Lymphs Abs: 0.9 10*3/uL — ABNORMAL LOW (ref 1.0–3.6)
MCH: 34.3 pg — ABNORMAL HIGH (ref 26.0–34.0)
MCHC: 34 g/dL (ref 32.0–36.0)
MCV: 100.8 fL — ABNORMAL HIGH (ref 80.0–100.0)
Monocytes Absolute: 0.3 10*3/uL (ref 0.2–1.0)
Monocytes Relative: 1 %
NEUTROS PCT: 95 %
Neutro Abs: 26.3 10*3/uL — ABNORMAL HIGH (ref 1.4–6.5)
Platelets: 258 10*3/uL (ref 150–440)
RBC: 3.1 MIL/uL — AB (ref 4.40–5.90)
RDW: 17.1 % — ABNORMAL HIGH (ref 11.5–14.5)
WBC: 27.6 10*3/uL — AB (ref 3.8–10.6)

## 2016-12-26 LAB — APTT: aPTT: 38 seconds — ABNORMAL HIGH (ref 24–36)

## 2016-12-26 LAB — PROTIME-INR
INR: 2.29
PROTHROMBIN TIME: 25.6 s — AB (ref 11.4–15.2)

## 2016-12-26 MED ORDER — PROTHROMBIN COMPLEX CONC HUMAN 500 UNITS IV KIT
5000.0000 [IU] | PACK | Status: AC
  Administered 2016-12-26: 3265 [IU] via INTRAVENOUS
  Filled 2016-12-26: qty 200

## 2016-12-26 NOTE — ED Notes (Signed)
Bruising noted to left inner tigh

## 2016-12-26 NOTE — ED Notes (Signed)
Emergency blood transfusion started per Dr. Farrel GobbleMalinda's order.

## 2016-12-26 NOTE — ED Provider Notes (Signed)
[facility] Emergency Department Provider Note   ____________________________________________   First MD Initiated Contact with Patient 12/26/16 1104     (approximate)  I have reviewed the triage vital signs and the nursing notes.   HISTORY  Chief Complaint Skin Ulcer (Sacraum bleeding )    HPI [name] is a 56 y.o. male with a history of cirrhosis A. fib on Erby Pianlec was who comes from home with a history of bleeding from his rectum. On entry into the emergency room patient is rolled on his side and his back is examined he has a necrotic ulcer about 4 cm in diameter on his sacrum this has a tunnel connecting it to a quarter sized ulcer immediately distal and toward the rectum this ulcer is fresh and had is still oozing blood. Multiple bloodsoaked chucks and towels were removed from under the patient and when he was rolled. Rectal exam was performed there is no bleeding in the rectum and no communication between the rectum and the ulcers both ulcers were packed with Surgicel and then 4 x 4's on top and covered with duodenum. Surgery was notified and is aware came down to see the patient as well. Patient's initial blood pressure was 93 systolic heart rate went up as high as 123. Emergency release blood had been requested but by the time the first unit was started the patient's blood pressure had gone up to about 113 systolic and heart rate was about 110. Vital signs continued to improve while patient was started on his blood which was being warmed with a level I. Patient apparently also has a history of cirrhosis and marked edema of his abdomen legs and scrotum and penis. Patient is very slow to respond to questions were answered yes or no appropriately but really doesn't say much else.   Past Medical History:  Diagnosis Date  . Anal fissure   . Atrial fibrillation (HCC)   . CHF (congestive heart failure) (HCC)   . Heart murmur   . Hypertension   .  Lymphadenopathy   . Personal history of colonic polyps     Patient Active Problem List   Diagnosis Date Noted  . Sepsis (HCC) 12/27/2016  . Pressure injury of skin 12/16/2016  . Acute renal failure (ARF) (HCC) 12/15/2016  . Anemia 12/15/2016  . 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 Hyponatremia 04/17/2016    Past Surgical History:  Procedure Laterality Date  . COLONOSCOPY  2012  . ESOPHAGOGASTRODUODENOSCOPY (EGD) WITH PROPOFOL N/A 12/18/2016   Procedure: ESOPHAGOGASTRODUODENOSCOPY (EGD) WITH PROPOFOL;  Surgeon: Toney Reilohini Reddy Vanga, MD;  Location: [facility] ENDOSCOPY;  Service: Endoscopy;  Laterality: N/A;  . HERNIA REPAIR  2012  . larynx-amyloidosis-laser surgery   2010    Prior to Admission medications   Medication Sig Start Date End Date Taking? Authorizing Provider  apixaban (ELIQUIS) 5 MG TABS tablet Take 1 tablet (5 mg total) by mouth 2 (two) times daily. 12/21/16  Yes Vipul Sherryll BurgerShah, MD  benazepril (LOTENSIN) 40 MG tablet Take 1 tablet (40 mg total) by mouth daily. 12/21/16  Yes Vipul Sherryll BurgerShah, MD  folic acid (FOLVITE) 1 MG tablet Take 1 tablet (1 mg total) by mouth daily. 12/21/16  Yes Vipul Sherryll BurgerShah, MD  furosemide (LASIX) 40 MG tablet Take 40 mg by mouth daily.    Yes Historical Provider,  MD  naproxen sodium (ALEVE) 220 MG tablet Take 440 mg by mouth 2 (two) times daily with a meal.   Yes Historical Provider, MD  potassium chloride SA (K-DUR,KLOR-CON) 20 MEQ tablet Take 10 mEq by mouth daily.    Yes Historical Provider, MD  sotalol (BETAPACE) 80 MG tablet Take 1 tablet (80 mg total) by mouth 2 (two) times daily. 12/21/16  Yes Delfino Lovett, MD    Allergies Clindamycin/lincomycin  Family History  Problem Relation Age of Onset  . Hypertension Other   . Hypertension Brother     Social History Social History  Substance Use Topics  .  Smoking status: Current Every Day Smoker    Packs/day: 1.00    Years: 20.00  . Smokeless tobacco: Never Used  . Alcohol use Yes    Review of Systems    Unable to obtain review of systems as patient will not answer questions ____________________________________________   PHYSICAL EXAM:  VITAL SIGNS: ED Triage Vitals  Enc Vitals Group     BP 12/26/16 2316 (!) 112/95     Pulse Rate 12/26/16 2316 (!) 104     Resp 12/26/16 2316 (!) 29     Temp 12/26/16 2316 97.8 F (36.6 C)     Temp src --      SpO2 12/26/16 2320 100 %     Weight 12/26/16 2320 (!) 324 lb (147 kg)     Height 12/26/16 2320 6\' 4"  (1.93 m)     Head Circumference --      Peak Flow --      Pain Score --      Pain Loc --      Pain Edu? --      Excl. in GC? --     Constitutional: Awake and alert. Not really responding to questions Eyes: Conjunctivae are normal. PERRL. EOMI. Head: Atraumatic. Nose: No congestion/rhinnorhea. Mouth/Throat: Mucous membranes are moist.  Oropharynx non-erythematous. Neck: No stridor.  Cardiovascular: Normal rate, regular rhythm. Grossly normal heart sounds.  Good peripheral circulation. Respiratory: Normal respiratory effort.  No retractions. Lungs CTAB. Gastrointestinal: Soft and nontender. Distended with some bruising lower on the abdomen Genitourinary: Uncircumcised male with marked edema Musculoskeletal: Legs are edematous and cool   ____________________________________________   LABS (all labs ordered are listed, but only abnormal results are displayed)  Labs Reviewed  PROTIME-INR - Abnormal; Notable for the following:       Result Value   Prothrombin Time 25.6 (*)    All other components within normal limits  APTT - Abnormal; Notable for the following:    aPTT 38 (*)    All other components within normal limits  CBC WITH DIFFERENTIAL/PLATELET - Abnormal; Notable for the following:    WBC 27.6 (*)    RBC 3.10 (*)    Hemoglobin 10.6 (*)    HCT 31.2 (*)    MCV  100.8 (*)    MCH 34.3 (*)    RDW 17.1 (*)    Neutro Abs 26.3 (*)    Lymphs Abs 0.9 (*)    All other components within normal limits  COMPREHENSIVE METABOLIC PANEL - Abnormal; Notable for the following:    Sodium 129 (*)    Potassium 5.5 (*)    Chloride 93 (*)    Glucose, Bld 142 (*)    BUN 39 (*)    Creatinine, Ser 1.71 (*)    Calcium 8.2 (*)    Albumin 2.2 (*)    AST 54 (*)  Alkaline Phosphatase 153 (*)    Total Bilirubin 2.0 (*)    GFR calc non Af Amer 43 (*)    GFR calc Af Amer 50 (*)    All other components within normal limits  URINE CULTURE  CULTURE, BLOOD (ROUTINE X 2)  CULTURE, BLOOD (ROUTINE X 2)  PROCALCITONIN  URINALYSIS, COMPLETE (UACMP) WITH MICROSCOPIC  LACTIC ACID, PLASMA  LACTIC ACID, PLASMA  I-STAT CG4 LACTIC ACID, ED  I-STAT CG4 LACTIC ACID, ED  TYPE AND SCREEN  PREPARE RBC (CROSSMATCH)  TYPE AND SCREEN  TRANSFUSION REACTION  TYPE AND SCREEN   ____________________________________________  EKG   ____________________________________________  RADIOLOGY  Study Result   CLINICAL DATA:  Central line placement  EXAM: PORTABLE CHEST 1 VIEW  COMPARISON:  Chest radiograph 12/26/2016  FINDINGS: Right subclavian approach central venous catheter tip overlies the lower SVC. The remainder the examination is unchanged.  IMPRESSION: Right subclavian central venous catheter tip overlying the lower superior vena cava.   Electronically Signed   By: Deatra Robinson M.D.   On: 12/27/2016 05:18    ____________________________________________   PROCEDURES  Procedure(s) performed: Central line placed oral consent obtained patient evaluated with ultrasound prepped and draped in usual sterile fashion fashion supraclavicular technique attempted first good flashback obtained however after about 2 cm the wire would not thread any further and I could not thread the catheter. This was probably due to a fall which I did see on the ultrasound.  Subclavicular was attempted neck access was obtained easily wire threaded easily and the catheter was threaded and flushed easily.  Procedures  Critical Care performed: Medical care time one hour including discussing with patient patient's family Dr. Sheryle Hail and then a referral to the intensivist and placing central line ____________________________________________   INITIAL IMPRESSION / ASSESSMENT AND PLAN / ED COURSE  Pertinent labs & imaging results that were available during my care of the patient were reviewed by me and considered in my medical decision making (see chart for details).    Clinical Course      ____________________________________________   FINAL CLINICAL IMPRESSION(S) / ED DIAGNOSES  Final diagnoses:  Sepsis, due to unspecified organism (HCC)   Episode of massive bleeding from sacral decubitus Necrotic second sacral decubitus   NEW MEDICATIONS STARTED DURING THIS VISIT:  New Prescriptions   No medications on file     Note:  This document was prepared using Dragon voice recognition software and may include unintentional dictation errors.    Arnaldo Natal, MD 12/27/16 [phone]

## 2016-12-26 NOTE — ED Notes (Signed)
Dr. Tonita [facility] at bedside with Dr. Darnelle CatalanMalinda

## 2016-12-26 NOTE — ED Notes (Signed)
Pt able to answer yes no questions continues with dry weak cough.100% on non-rebreather mask  Pt skin color pale, warm to touch.

## 2016-12-26 NOTE — ED Notes (Signed)
X-ray at bedside

## 2016-12-26 NOTE — ED Triage Notes (Signed)
Pt presents to ER via [facility] emergency traffic, bleeding from sacrum area, bleeding is not from rectum bleeding is from decubitus ulcer to sacrum, assessed by ER physician Dr. Darnelle CatalanMalinda upon pt's arrival. Pt is in awake, alert.

## 2016-12-26 NOTE — ED Notes (Signed)
Dr. Darnelle CatalanMalinda cleaning decubitus ulcer in sacrum applied surgicel and 4X4 with dressing Noted swelling to pt's penis unable to verbalize in pain pt continues to cough.

## 2016-12-27 ENCOUNTER — Inpatient Hospital Stay: Payer: BC Managed Care – PPO

## 2016-12-27 ENCOUNTER — Emergency Department: Payer: BC Managed Care – PPO

## 2016-12-27 DIAGNOSIS — L8994 Pressure ulcer of unspecified site, stage 4: Secondary | ICD-10-CM

## 2016-12-27 DIAGNOSIS — L89150 Pressure ulcer of sacral region, unstageable: Secondary | ICD-10-CM | POA: Diagnosis present

## 2016-12-27 DIAGNOSIS — Z23 Encounter for immunization: Secondary | ICD-10-CM | POA: Diagnosis not present

## 2016-12-27 DIAGNOSIS — F17200 Nicotine dependence, unspecified, uncomplicated: Secondary | ICD-10-CM | POA: Diagnosis present

## 2016-12-27 DIAGNOSIS — I482 Chronic atrial fibrillation: Secondary | ICD-10-CM | POA: Diagnosis present

## 2016-12-27 DIAGNOSIS — R578 Other shock: Secondary | ICD-10-CM | POA: Diagnosis present

## 2016-12-27 DIAGNOSIS — D62 Acute posthemorrhagic anemia: Secondary | ICD-10-CM | POA: Diagnosis present

## 2016-12-27 DIAGNOSIS — N170 Acute kidney failure with tubular necrosis: Secondary | ICD-10-CM | POA: Diagnosis present

## 2016-12-27 DIAGNOSIS — F4323 Adjustment disorder with mixed anxiety and depressed mood: Secondary | ICD-10-CM | POA: Diagnosis present

## 2016-12-27 DIAGNOSIS — A419 Sepsis, unspecified organism: Principal | ICD-10-CM

## 2016-12-27 DIAGNOSIS — L89310 Pressure ulcer of right buttock, unstageable: Secondary | ICD-10-CM | POA: Diagnosis present

## 2016-12-27 DIAGNOSIS — R6521 Severe sepsis with septic shock: Secondary | ICD-10-CM | POA: Diagnosis present

## 2016-12-27 DIAGNOSIS — R579 Shock, unspecified: Secondary | ICD-10-CM | POA: Diagnosis not present

## 2016-12-27 DIAGNOSIS — R591 Generalized enlarged lymph nodes: Secondary | ICD-10-CM | POA: Diagnosis present

## 2016-12-27 DIAGNOSIS — E875 Hyperkalemia: Secondary | ICD-10-CM | POA: Diagnosis present

## 2016-12-27 DIAGNOSIS — K7031 Alcoholic cirrhosis of liver with ascites: Secondary | ICD-10-CM | POA: Diagnosis not present

## 2016-12-27 DIAGNOSIS — I5042 Chronic combined systolic (congestive) and diastolic (congestive) heart failure: Secondary | ICD-10-CM | POA: Diagnosis present

## 2016-12-27 DIAGNOSIS — I959 Hypotension, unspecified: Secondary | ICD-10-CM | POA: Diagnosis not present

## 2016-12-27 DIAGNOSIS — I130 Hypertensive heart and chronic kidney disease with heart failure and stage 1 through stage 4 chronic kidney disease, or unspecified chronic kidney disease: Secondary | ICD-10-CM | POA: Diagnosis present

## 2016-12-27 DIAGNOSIS — I9589 Other hypotension: Secondary | ICD-10-CM | POA: Diagnosis present

## 2016-12-27 DIAGNOSIS — I739 Peripheral vascular disease, unspecified: Secondary | ICD-10-CM | POA: Diagnosis present

## 2016-12-27 DIAGNOSIS — G9341 Metabolic encephalopathy: Secondary | ICD-10-CM | POA: Diagnosis present

## 2016-12-27 DIAGNOSIS — L89320 Pressure ulcer of left buttock, unstageable: Secondary | ICD-10-CM | POA: Diagnosis present

## 2016-12-27 DIAGNOSIS — E871 Hypo-osmolality and hyponatremia: Secondary | ICD-10-CM | POA: Diagnosis present

## 2016-12-27 DIAGNOSIS — R4189 Other symptoms and signs involving cognitive functions and awareness: Secondary | ICD-10-CM | POA: Diagnosis present

## 2016-12-27 DIAGNOSIS — L89153 Pressure ulcer of sacral region, stage 3: Secondary | ICD-10-CM | POA: Diagnosis present

## 2016-12-27 DIAGNOSIS — E440 Moderate protein-calorie malnutrition: Secondary | ICD-10-CM | POA: Diagnosis present

## 2016-12-27 LAB — COMPREHENSIVE METABOLIC PANEL
ALT: 27 U/L (ref 17–63)
AST: 54 U/L — AB (ref 15–41)
Albumin: 2.2 g/dL — ABNORMAL LOW (ref 3.5–5.0)
Alkaline Phosphatase: 153 U/L — ABNORMAL HIGH (ref 38–126)
Anion gap: 14 (ref 5–15)
BUN: 39 mg/dL — ABNORMAL HIGH (ref 6–20)
CHLORIDE: 93 mmol/L — AB (ref 101–111)
CO2: 22 mmol/L (ref 22–32)
CREATININE: 1.71 mg/dL — AB (ref 0.61–1.24)
Calcium: 8.2 mg/dL — ABNORMAL LOW (ref 8.9–10.3)
GFR calc non Af Amer: 43 mL/min — ABNORMAL LOW (ref >60)
GFR, EST AFRICAN AMERICAN: 50 mL/min — AB (ref >60)
Glucose, Bld: 142 mg/dL — ABNORMAL HIGH (ref 65–99)
Potassium: 5.5 mmol/L — ABNORMAL HIGH (ref 3.5–5.1)
SODIUM: 129 mmol/L — AB (ref 135–145)
Total Bilirubin: 2 mg/dL — ABNORMAL HIGH (ref 0.3–1.2)
Total Protein: 7.2 g/dL (ref 6.5–8.1)

## 2016-12-27 LAB — URINALYSIS, COMPLETE (UACMP) WITH MICROSCOPIC
Bacteria, UA: NONE SEEN
Bilirubin Urine: NEGATIVE
GLUCOSE, UA: NEGATIVE mg/dL
Ketones, ur: NEGATIVE mg/dL
NITRITE: NEGATIVE
PH: 5.5 (ref 5.0–8.0)
Protein, ur: 30 mg/dL — AB
SPECIFIC GRAVITY, URINE: 1.015 (ref 1.005–1.030)

## 2016-12-27 LAB — BASIC METABOLIC PANEL
Anion gap: 8 (ref 5–15)
BUN: 37 mg/dL — ABNORMAL HIGH (ref 6–20)
CHLORIDE: 97 mmol/L — AB (ref 101–111)
CO2: 24 mmol/L (ref 22–32)
Calcium: 7.7 mg/dL — ABNORMAL LOW (ref 8.9–10.3)
Creatinine, Ser: 1.45 mg/dL — ABNORMAL HIGH (ref 0.61–1.24)
GFR calc Af Amer: >60 mL/min (ref >60)
GFR calc non Af Amer: 52 mL/min — ABNORMAL LOW (ref >60)
Glucose, Bld: 175 mg/dL — ABNORMAL HIGH (ref 65–99)
POTASSIUM: 4.2 mmol/L (ref 3.5–5.1)
SODIUM: 129 mmol/L — AB (ref 135–145)

## 2016-12-27 LAB — CBC
HEMATOCRIT: 24.1 % — AB (ref 40.0–52.0)
Hemoglobin: 8.3 g/dL — ABNORMAL LOW (ref 13.0–18.0)
MCH: 34.2 pg — ABNORMAL HIGH (ref 26.0–34.0)
MCHC: 34.3 g/dL (ref 32.0–36.0)
MCV: 99.7 fL (ref 80.0–100.0)
Platelets: 173 10*3/uL (ref 150–440)
RBC: 2.42 MIL/uL — ABNORMAL LOW (ref 4.40–5.90)
RDW: 17.4 % — ABNORMAL HIGH (ref 11.5–14.5)
WBC: 25.9 10*3/uL — AB (ref 3.8–10.6)

## 2016-12-27 LAB — MAGNESIUM: MAGNESIUM: 1.1 mg/dL — AB (ref 1.7–2.4)

## 2016-12-27 LAB — TRANSFUSION REACTION
DAT C3: NEGATIVE
Post RXN DAT IgG: NEGATIVE

## 2016-12-27 LAB — PROTIME-INR
INR: 1.92
Prothrombin Time: 22.2 seconds — ABNORMAL HIGH (ref 11.4–15.2)

## 2016-12-27 LAB — TYPE AND SCREEN
ABO/RH(D): A POS
ANTIBODY SCREEN: NEGATIVE

## 2016-12-27 LAB — LACTIC ACID, PLASMA
LACTIC ACID, VENOUS: 2.8 mmol/L — AB (ref 0.5–1.9)
LACTIC ACID, VENOUS: 2.9 mmol/L — AB (ref 0.5–1.9)

## 2016-12-27 LAB — TSH: TSH: 1.437 u[IU]/mL (ref 0.350–4.500)

## 2016-12-27 LAB — GLUCOSE, CAPILLARY: Glucose-Capillary: 174 mg/dL — ABNORMAL HIGH (ref 65–99)

## 2016-12-27 LAB — PROCALCITONIN: PROCALCITONIN: 1.86 ng/mL

## 2016-12-27 LAB — MRSA PCR SCREENING: MRSA BY PCR: NEGATIVE

## 2016-12-27 LAB — PREPARE RBC (CROSSMATCH)

## 2016-12-27 MED ORDER — FOLIC ACID 1 MG PO TABS
1.0000 mg | ORAL_TABLET | Freq: Every day | ORAL | Status: DC
  Administered 2016-12-27 – 2017-01-14 (×19): 1 mg via ORAL
  Filled 2016-12-27 (×19): qty 1

## 2016-12-27 MED ORDER — ACETAMINOPHEN 650 MG RE SUPP
650.0000 mg | Freq: Four times a day (QID) | RECTAL | Status: DC | PRN
Start: 2016-12-27 — End: 2017-01-14

## 2016-12-27 MED ORDER — VANCOMYCIN HCL IN DEXTROSE 1-5 GM/200ML-% IV SOLN
1000.0000 mg | Freq: Once | INTRAVENOUS | Status: AC
  Administered 2016-12-27: 1000 mg via INTRAVENOUS
  Filled 2016-12-27: qty 200

## 2016-12-27 MED ORDER — ACETAMINOPHEN 325 MG PO TABS: 650.0000 mg | ORAL_TABLET | Freq: Four times a day (QID) | ORAL | Status: DC | PRN

## 2016-12-27 MED ORDER — SODIUM CHLORIDE 0.9 % IV BOLUS (SEPSIS)
1000.0000 mL | Freq: Once | INTRAVENOUS | Status: AC
Start: 2016-12-27 — End: 2016-12-27
  Administered 2016-12-27: 1000 mL via INTRAVENOUS

## 2016-12-27 MED ORDER — PIPERACILLIN-TAZOBACTAM 3.375 G IVPB 30 MIN
3.3750 g | Freq: Once | INTRAVENOUS | Status: AC
  Administered 2016-12-27: 3.375 g via INTRAVENOUS
  Filled 2016-12-27: qty 50

## 2016-12-27 MED ORDER — SOTALOL HCL 80 MG PO TABS
80.0000 mg | ORAL_TABLET | Freq: Two times a day (BID) | ORAL | Status: DC
  Administered 2016-12-29 – 2017-01-14 (×33): 80 mg via ORAL
  Filled 2016-12-27 (×35): qty 1

## 2016-12-27 MED ORDER — SODIUM CHLORIDE 0.9% FLUSH
3.0000 mL | Freq: Two times a day (BID) | INTRAVENOUS | Status: DC
  Administered 2016-12-27 – 2017-01-14 (×35): 3 mL via INTRAVENOUS

## 2016-12-27 MED ORDER — LIDOCAINE HCL (PF) 1 % IJ SOLN
INTRAMUSCULAR | Status: AC
  Filled 2016-12-27: qty 10

## 2016-12-27 MED ORDER — NOREPINEPHRINE BITARTRATE 1 MG/ML IV SOLN
0.0000 ug/min | Freq: Once | INTRAVENOUS | Status: AC
  Administered 2016-12-27: 12 ug/min via INTRAVENOUS
  Filled 2016-12-27: qty 4

## 2016-12-27 MED ORDER — ONDANSETRON HCL 4 MG PO TABS: 4.0000 mg | ORAL_TABLET | Freq: Four times a day (QID) | ORAL | Status: DC | PRN

## 2016-12-27 MED ORDER — DAKINS (1/4 STRENGTH) 0.125 % EX SOLN
Freq: Two times a day (BID) | CUTANEOUS | Status: AC
  Administered 2016-12-27 – 2016-12-29 (×6)
  Filled 2016-12-27: qty 473

## 2016-12-27 MED ORDER — IOPAMIDOL (ISOVUE-300) INJECTION 61%
75.0000 mL | Freq: Once | INTRAVENOUS | Status: AC | PRN
  Administered 2016-12-27: 75 mL via INTRAVENOUS

## 2016-12-27 MED ORDER — SODIUM CHLORIDE 0.9 % IV SOLN
INTRAVENOUS | Status: DC
  Administered 2016-12-27: 08:00:00 via INTRAVENOUS
  Administered 2016-12-28: 1 mL via INTRAVENOUS

## 2016-12-27 MED ORDER — ONDANSETRON HCL 4 MG/2ML IJ SOLN
4.0000 mg | Freq: Four times a day (QID) | INTRAMUSCULAR | Status: DC | PRN
  Administered 2017-01-08 – 2017-01-11 (×2): 4 mg via INTRAVENOUS
  Filled 2016-12-27 (×2): qty 2

## 2016-12-27 MED ORDER — NOREPINEPHRINE BITARTRATE 1 MG/ML IV SOLN
0.0000 ug/min | INTRAVENOUS | Status: DC
  Administered 2016-12-27: 14 ug/min via INTRAVENOUS
  Administered 2016-12-27: 5 ug/min via INTRAVENOUS
  Filled 2016-12-27 (×2): qty 16

## 2016-12-27 MED ORDER — SODIUM CHLORIDE 0.9 % IV SOLN
Freq: Once | INTRAVENOUS | Status: AC
  Administered 2016-12-27: 999 mL/h via INTRAVENOUS

## 2016-12-27 MED ORDER — POTASSIUM CHLORIDE CRYS ER 10 MEQ PO TBCR
10.0000 meq | EXTENDED_RELEASE_TABLET | Freq: Every day | ORAL | Status: DC
  Administered 2016-12-28 – 2016-12-30 (×3): 10 meq via ORAL
  Filled 2016-12-27 (×3): qty 1

## 2016-12-27 MED ORDER — DIPHENHYDRAMINE HCL 50 MG/ML IJ SOLN: 12.5000 mg | Freq: Once | INTRAMUSCULAR | Status: DC

## 2016-12-27 MED ORDER — BENAZEPRIL HCL 5 MG PO TABS: 40.0000 mg | ORAL_TABLET | Freq: Every day | ORAL | Status: DC

## 2016-12-27 MED ORDER — OXYCODONE HCL 5 MG PO TABS
5.0000 mg | ORAL_TABLET | Freq: Four times a day (QID) | ORAL | Status: DC | PRN
  Administered 2016-12-27 – 2016-12-28 (×2): 5 mg via ORAL
  Filled 2016-12-27 (×2): qty 1

## 2016-12-27 MED ORDER — DOCUSATE SODIUM 100 MG PO CAPS
100.0000 mg | ORAL_CAPSULE | Freq: Two times a day (BID) | ORAL | Status: DC
  Administered 2016-12-27 – 2017-01-01 (×8): 100 mg via ORAL
  Filled 2016-12-27 (×9): qty 1

## 2016-12-27 MED ORDER — AMMONIUM LACTATE 12 % EX LOTN
TOPICAL_LOTION | Freq: Every day | CUTANEOUS | Status: DC
Start: 2016-12-27 — End: 2017-01-14
  Administered 2016-12-27 – 2017-01-14 (×13): via TOPICAL
  Filled 2016-12-27: qty 400

## 2016-12-27 MED ORDER — PNEUMOCOCCAL VAC POLYVALENT 25 MCG/0.5ML IJ INJ: 0.5000 mL | INJECTION | INTRAMUSCULAR | Status: DC

## 2016-12-27 MED ORDER — MAGNESIUM SULFATE 4 GM/100ML IV SOLN
4.0000 g | Freq: Once | INTRAVENOUS | Status: AC
  Administered 2016-12-27: 4 g via INTRAVENOUS
  Filled 2016-12-27: qty 100

## 2016-12-27 MED ORDER — PIPERACILLIN-TAZOBACTAM 4.5 G IVPB
4.5000 g | Freq: Three times a day (TID) | INTRAVENOUS | Status: AC
Start: 2016-12-27 — End: 2017-01-01
  Administered 2016-12-27 – 2017-01-01 (×16): 4.5 g via INTRAVENOUS
  Filled 2016-12-27 (×19): qty 100

## 2016-12-27 MED ORDER — SODIUM CHLORIDE 0.9 % IV BOLUS (SEPSIS)
500.0000 mL | Freq: Once | INTRAVENOUS | Status: AC
Start: 2016-12-27 — End: 2016-12-27
  Administered 2016-12-27: 500 mL via INTRAVENOUS

## 2016-12-27 MED ORDER — VANCOMYCIN HCL 10 G IV SOLR
1500.0000 mg | Freq: Two times a day (BID) | INTRAVENOUS | Status: DC
  Administered 2016-12-27: 1500 mg via INTRAVENOUS
  Filled 2016-12-27 (×2): qty 1500

## 2016-12-27 MED ORDER — CIPROFLOXACIN HCL 500 MG PO TABS
500.0000 mg | ORAL_TABLET | Freq: Two times a day (BID) | ORAL | Status: DC
  Administered 2016-12-27: 500 mg via ORAL
  Filled 2016-12-27: qty 1

## 2016-12-27 MED ORDER — SODIUM CHLORIDE 0.9 % IV BOLUS (SEPSIS)
1000.0000 mL | Freq: Once | INTRAVENOUS | Status: AC
  Administered 2016-12-27: 1000 mL via INTRAVENOUS

## 2016-12-27 MED ORDER — FUROSEMIDE 40 MG PO TABS
40.0000 mg | ORAL_TABLET | Freq: Every day | ORAL | Status: DC
  Administered 2016-12-27 – 2017-01-01 (×6): 40 mg via ORAL
  Filled 2016-12-27 (×6): qty 1

## 2016-12-27 NOTE — ED Notes (Signed)
Dr. Sheryle Hailiamond admitting physician at bedside

## 2016-12-27 NOTE — ED Notes (Signed)
Per Dr. Farrel GobbleMalinda's order to administer 500ML of NS

## 2016-12-27 NOTE — ED Notes (Signed)
Per Dr. Darnelle CatalanMalinda to stop infusion of KCentra. Administered 130.296ml left in bag 69.144ml. Flushed line

## 2016-12-27 NOTE — ED Notes (Signed)
Lurena Joinerebecca, RN and this RN repositioned pt, put 2 pillows under right side of buttocks. Pt tolerated well.

## 2016-12-27 NOTE — ED Notes (Signed)
Assisted MD [name] with central line placement

## 2016-12-27 NOTE — ED Notes (Signed)
Blood transfusion stopped

## 2016-12-27 NOTE — ED Notes (Signed)
Fluids running through rapid infusor per Dr, Farrel GobbleMalinda's order

## 2016-12-27 NOTE — ED Notes (Signed)
Pt talking to family members

## 2016-12-27 NOTE — ED Notes (Signed)
Called floor to let them know patient on the way 

## 2016-12-27 NOTE — Progress Notes (Signed)
MEDICATION RELATED CONSULT NOTE   Pharmacy Consult for Zosyn  Indication: sepsis possibly from sacral area  Assessment: 55yoM on home apixaban, who presents with sacral bleeding given KCETNRA. Pt found to be septic. Pharmacy consulted for Zosyn dosing.   Plan:  Continue Zosyn 4.5g IV Q8h due to patient weight of 143.7kg.    Allergies  Allergen Reactions  . Clindamycin/Lincomycin Diarrhea    Patient Measurements: Height: 6\' 4"  (193 cm) Weight: (!) 316 lb 12.8 oz (143.7 kg) IBW/kg (Calculated) : 86.8  Vital Signs: Temp: 97.9 F (36.6 C) (01/15 0815) Temp Source: Oral (01/15 0815) BP: 107/81 (01/15 1030) Pulse Rate: 83 (01/15 1030) Intake/Output from previous day: 01/14 0701 - 01/15 0700 In: 3471.8 [I.V.:1221.8; IV Piggyback:2250] Out: -  Intake/Output from this shift: Total I/O In: 755 [P.O.:60; I.V.:95; IV Piggyback:600] Out: -   Labs:  Recent Labs  12/26/16 2313 12/26/16 2351  WBC 27.6*  --   HGB 10.6*  --   HCT 31.2*  --   PLT 258  --   APTT 38*  --   CREATININE  --  1.71*  ALBUMIN  --  2.2*  PROT  --  7.2  AST  --  54*  ALT  --  27  ALKPHOS  --  153*  BILITOT  --  2.0*   Estimated Creatinine Clearance: 74.8 mL/min (by C-G formula based on SCr of 1.71 mg/dL (H)).   Microbiology: Recent Results (from the past 720 hour(s))  Body fluid culture     Status: None   Collection Time: 12/17/16  1:15 PM  Result Value Ref Range Status   Specimen Description PERITONEAL  Final   Special Requests NONE  Final   Gram Stain   Final    FEW WBC PRESENT,BOTH PMN AND MONONUCLEAR NO ORGANISMS SEEN    Culture   Final    No growth aerobically or anaerobically. [facility]    Report Status 12/20/2016 FINAL  Final  Culture, blood (routine x 2)     Status: None (Preliminary result)   Collection Time: 12/27/16 12:40 AM  Result Value Ref Range Status   Specimen Description BLOOD RIGHT HAND  Final   Special Requests   Final    BOTTLES DRAWN  AEROBIC AND ANAEROBIC AERO5ML,ANAERO4ML   Culture NO GROWTH < 12 HOURS  Final   Report Status PENDING  Incomplete  Culture, blood (routine x 2)     Status: None (Preliminary result)   Collection Time: 12/27/16 12:40 AM  Result Value Ref Range Status   Specimen Description BLOOD LEFT HAND  Final   Special Requests   Final    BOTTLES DRAWN AEROBIC AND ANAEROBIC AERO3ML,ANAERO10ML   Culture NO GROWTH < 12 HOURS  Final   Report Status PENDING  Incomplete  MRSA PCR Screening     Status: None   Collection Time: 12/27/16  6:30 AM  Result Value Ref Range Status   MRSA by PCR NEGATIVE NEGATIVE Final    Comment:        The GeneXpert MRSA Assay (FDA approved for NASAL specimens only), is one component of a comprehensive MRSA colonization surveillance program. It is not intended to diagnose MRSA infection nor to guide or monitor treatment for MRSA infections.     Antibiotics: Cipro 1/15 >>1/15 Vancomycin 1/15 >>1/15 Zosyn 1/15 >>  Cultures: 1/15 BCx: NGTD 1/15 MRSA PCR: negative  1/15 UCx: sent    Delsa BernKelly m Fuhrmann, PharmD 12/27/2016,10:50 AM

## 2016-12-27 NOTE — Progress Notes (Signed)
While rounding, CH made initial visit with the Father and Brother of the Pt.(IC 04)  Both were concerned that the Pt was re-admitted and felt "helpless". CH provided the ministry of empathetic listening and prayer. CH is available for follow up as needed.    12/27/16 1300  Clinical Encounter Type  Visited With Family  Visit Type Initial;Spiritual support  Referral From Nurse  Consult/Referral To Chaplain  Spiritual Encounters  Spiritual Needs Prayer;Emotional

## 2016-12-27 NOTE — Consult Note (Addendum)
WOC Nurse wound consult note Reason for Consult: Unstageable pressure injury to coccyx, bilateral buttocks.  Surgery has consulted and may intervene if patient becomes more stable.  Was admitted with sepsis and uncontrolled bleeding of sacral ulcer.  CT scan showed inflammation extending to coccyx and right ischium.  No fistula was seen.  Chronic skin changes to bilateral lower extremities.  Will moisturize daily.   Wound type:Unstageable pressure injury.not a surgical candidate at this time.  Pressure Injury POA: Yes Measurement: Coccyx:  4 cm x 3.4 cm 100% necrotic wound bed.  Right buttocks 2 cm x 2 cm 100% necrotic wound bed.   Wound bed:100% necrotic with foul smelling, moderate, purulent drainage.    Periwound:2 areas of medical adhesive related skin injury (MARSI) to periwound.  2 cm x 1 cm x 0.1 cm  Dressing procedure/placement/frequency:Cleanse sacral and buttocks wounds with NS.  Gently fill necrotic wound with Dakin's moistened kerlix.  Cover with 4x4 gauze and ABD pad/tape.  Change twice daily.  Cover periwound denuded skin with silicone border foam dressings.  Change daily.  Turn and reposition every two hours.   Will need mattress replacement with low air loss feature when transferred to the floor.   WOC team will follow.  Maple HudsonKaren Kc Sedlak RN BSN CWON Pager [phone]

## 2016-12-27 NOTE — Consult Note (Signed)
  Asked by Dr. Darnelle CatalanMalinda to evaluate patient's decubitus ulcers  Patient noted to have a markedly necrotic ulcer as well as a bleeding ulcer closer to his rectum. Actively being resuscitated. Unable to fully evaluate secondary to ongoing resuscitation  Of note patient was discharged from the hospital 6 days ago after evaluation for acute renal failure and anemia. 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 Patient was transitioned from Xarelto to Eliquis for A. fib. He was noted to have evidence of a necrotic decubitus ulcer at the time of discharge.  Initial labs showed leukocytosis of 27.6, PTT 25.6, and INR of 2.29.  Full consult to follow. Patient may require debridement of his decubitus ulcer however he is extremely poor surgical candidate at this time due to his cirrhosis and anticoagulated state.  Ricarda Frameharles Lashon Beringer, MD [facility] General Surgeon [facility]

## 2016-12-27 NOTE — Progress Notes (Signed)
ANTIBIOTIC CONSULT NOTE - INITIAL  Pharmacy Consult for Vancomycin , Zosyn  Indication: sepsis  Allergies  Allergen Reactions  . Clindamycin/Lincomycin Diarrhea    Patient Measurements: Height: 6\' 4"  (193 cm) Weight: (!) 316 lb 12.8 oz (143.7 kg) IBW/kg (Calculated) : 86.8 Adjusted Body Weight: 110.9 kg   Vital Signs: Temp: 94.5 F (34.7 C) (01/15 0630) Temp Source: Oral (01/15 0630) BP: 105/81 (01/15 0630) Pulse Rate: 83 (01/15 0630) Intake/Output from previous day: 01/14 0701 - 01/15 0700 In: 3250 [I.V.:1000; IV Piggyback:2250] Out: -  Intake/Output from this shift: Total I/O In: 3250 [I.V.:1000; IV Piggyback:2250] Out: -   Labs:  Recent Labs  12/26/16 2313 12/26/16 2351  WBC 27.6*  --   HGB 10.6*  --   PLT 258  --   CREATININE  --  1.71*   Estimated Creatinine Clearance: 74.8 mL/min (by C-G formula based on SCr of 1.71 mg/dL (H)). No results for input(s): VANCOTROUGH, VANCOPEAK, VANCORANDOM, GENTTROUGH, GENTPEAK, GENTRANDOM, TOBRATROUGH, TOBRAPEAK, TOBRARND, AMIKACINPEAK, AMIKACINTROU, AMIKACIN in the last 72 hours.   Microbiology: Recent Results (from the past 720 hour(s))  Body fluid culture     Status: None   Collection Time: 12/17/16  1:15 PM  Result Value Ref Range Status   Specimen Description PERITONEAL  Final   Special Requests NONE  Final   Gram Stain   Final    FEW WBC PRESENT,BOTH PMN AND MONONUCLEAR NO ORGANISMS SEEN    Culture   Final    No growth aerobically or anaerobically. Performed at [facility]    Report Status 12/20/2016 FINAL  Final    Medical History: Past Medical History:  Diagnosis Date  . Anal fissure   . Atrial fibrillation (HCC)   . CHF (congestive heart failure) (HCC)   . Heart murmur   . Hypertension   . Lymphadenopathy   . Personal history of colonic polyps     Medications:  Prescriptions Prior to Admission  Medication Sig Dispense Refill Last Dose  . apixaban (ELIQUIS) 5 MG TABS tablet Take 1  tablet (5 mg total) by mouth 2 (two) times daily. 60 tablet 0 unknown at unknown  . benazepril (LOTENSIN) 40 MG tablet Take 1 tablet (40 mg total) by mouth daily. 30 tablet 0 unknown at unknown  . folic acid (FOLVITE) 1 MG tablet Take 1 tablet (1 mg total) by mouth daily. 30 tablet 0 unknown at unknown  . furosemide (LASIX) 40 MG tablet Take 40 mg by mouth daily.    unknown at unknown  . naproxen sodium (ALEVE) 220 MG tablet Take 440 mg by mouth 2 (two) times daily with a meal.   unknown at unknown  . potassium chloride SA (K-DUR,KLOR-CON) 20 MEQ tablet Take 10 mEq by mouth daily.    unknown at unknown  . sotalol (BETAPACE) 80 MG tablet Take 1 tablet (80 mg total) by mouth 2 (two) times daily. 60 tablet 0 unknown at unknown   Assessment: CrCl = 75.7 ml/min  Ke = 0.068 hr-1 T1/2 = 10.2 hrs Vd = 77.6 L   Goal of Therapy:  Vancomycin trough level 15-20 mcg/ml  Plan:  Expected duration 7 days with resolution of temperature and/or normalization of WBC   Zosyn 3.375 gm IV X 1 given on 1/15 @ 0100. Zosyn 4.5 gm IV Q8H EI ordered to start on 1/15 @ 0700.  Vancomycin 1 gm IV X 1 given on 1/15 @ 0100. Vancomycin 1500 mg IV Q12H ordered to start on 1/15 @ 0800, ~  6 hrs after 1st dose (stacked dosing). This pt will reach Css on 1/17 @ 0100. Will draw 1st trough on 1/17 @ 19:30 , which will be close to Css.     Nesbit Michon D 12/27/2016,6:45 AM

## 2016-12-27 NOTE — ED Notes (Signed)
Dr. Darnelle CatalanMalinda at bedside talking to pt about CT

## 2016-12-27 NOTE — ED Notes (Signed)
Asked pt for urine sample pt reports to give him a few minutes, RN tried to help pt with urinal pt reports swelling to penis, RN observed area severe swelling to penis, denies pain. WIll continue to monitor and will attempt Urinal

## 2016-12-27 NOTE — Progress Notes (Signed)
RN spoke with Dr. Nicholos Johnsamachandran in person and made MD aware of lactic acid of 2.9, MD acknowledged and gave no new orders.

## 2016-12-27 NOTE — ED Notes (Signed)
Patient's BP trending down. MD notified. MD order to increase levophed to 14 mcg

## 2016-12-27 NOTE — ED Notes (Signed)
Family at bedside. 

## 2016-12-27 NOTE — ED Notes (Signed)
Pt sleeping, pt's girlfriend at bedside will continue to monitor

## 2016-12-27 NOTE — ED Notes (Signed)
Pt talking to brother and girlfriend

## 2016-12-27 NOTE — H&P (Signed)
PULMONARY / CRITICAL CARE MEDICINE   Name: [name] MRN: [id number] DOB: 1960/12/28    ADMISSION DATE:  12/26/2016   REFERRING MD:  Dr. Sheryle Hail  CHIEF COMPLAINT: Rectal bleeding   HISTORY OF PRESENT ILLNESS:   This is a 56 year old male with a past medical history of an outpatient, atrial fibrillation on anticoagulation, congestive heart failure, hypertension, alcoholic liver cirrhosis, and severe peripheral vascular disease who presents with bleeding from a perirectal ulcer/fissure. He states that when he took off the dressing from the perirectal ulcer, it started bleeding. Hence EMS was called. EMS reports noting massive rectal bleeding. Upon arrival. At the ED, further bleeding was noted from 2 all sinus close to the rectum, approximately 4 cm in size and necrotic. He was also noted to have a large number of blood soaked dressings underneath him. He was noted to be tachycardic and hypotensive hence he was given a unit of packed red blood cells. The bleeding area of the ulcer was packed with a Surgicel dressing. He continued to be hypotensive posttransfusion. Hence PCCM was asked to admit. He denies any pain, dizziness, and headache.   Patient was recently discharged from the hospital on 12/21/2016 after hospitalization for anemia, acute renal failure and generalized weakness. He was equally noted to be hypotensive during that hospitalization. He had a paracentesis with 6.2 L of fluid taken out. He was also seen by wound care and placed on daily dressings of silicon border foam and alginate.  PAST MEDICAL HISTORY :  He  has a past medical history of Anal fissure; Atrial fibrillation (HCC); CHF (congestive heart failure) (HCC); Heart murmur; Hypertension; Lymphadenopathy; and Personal history of colonic polyps.  PAST SURGICAL HISTORY: He  has a past surgical history that includes Hernia repair (2012); Colonoscopy (2012); larynx-amyloidosis-laser surgery  (2010); and  Esophagogastroduodenoscopy (egd) with propofol (N/A, 12/18/2016).  Allergies  Allergen Reactions  . Clindamycin/Lincomycin Diarrhea    No current facility-administered medications on file prior to encounter.    Current Outpatient Prescriptions on File Prior to Encounter  Medication Sig  . apixaban (ELIQUIS) 5 MG TABS tablet Take 1 tablet (5 mg total) by mouth 2 (two) times daily.  . benazepril (LOTENSIN) 40 MG tablet Take 1 tablet (40 mg total) by mouth daily.  . folic acid (FOLVITE) 1 MG tablet Take 1 tablet (1 mg total) by mouth daily.  . furosemide (LASIX) 40 MG tablet Take 40 mg by mouth daily.   . naproxen sodium (ALEVE) 220 MG tablet Take 440 mg by mouth 2 (two) times daily with a meal.  . potassium chloride SA (K-DUR,KLOR-CON) 20 MEQ tablet Take 10 mEq by mouth daily.   . sotalol (BETAPACE) 80 MG tablet Take 1 tablet (80 mg total) by mouth 2 (two) times daily.    FAMILY HISTORY:  His indicated that the status of his brother is unknown. He indicated that the status of his other is unknown.    SOCIAL HISTORY: He  reports that he has been smoking.  He has a 20.00 pack-year smoking history. He has never used smokeless tobacco. He reports that he drinks alcohol. He reports that he does not use drugs.  REVIEW OF SYSTEMS:   Constitutional: Negative for fever and chills.  HENT: Negative for congestion and rhinorrhea.  Eyes: Negative for redness and visual disturbance.  Respiratory: Negative for shortness of breath and wheezing.  Cardiovascular: Negative for chest pain and palpitations.  Gastrointestinal: Negative  for nausea , vomiting and abdominal pain and  Loose  stools Genitourinary: Negative for dysuria and urgency.  Endocrine: Denies polyuria, polyphagia and heat intolerance Musculoskeletal: Negative for myalgias and arthralgias.  Skin: Negative for pallor and wound.  Neurological: Negative for dizziness and headaches   SUBJECTIVE:    VITAL SIGNS: BP 108/87   Pulse 71    Temp 97.9 F (36.6 C) (Oral)   Resp 18   Ht 6\' 4"  (1.93 m)   Wt (!) 143.7 kg (316 lb 12.8 oz)   SpO2 98%   BMI 38.56 kg/m   HEMODYNAMICS:    VENTILATOR SETTINGS: FiO2 (%):  [100 %] 100 %  INTAKE / OUTPUT: I/O last 3 completed shifts: In: 3471.8 [I.V.:1221.8; IV Piggyback:2250] Out: -   PHYSICAL EXAMINATION: General:  Chronically ill looking male, older for age, no distress Neuro: AAO X2, somnolent, follows commands HEENT:  [place]/AT, PERRLA, oral mucosa moist, +gag, trachea midline Cardiovascular: RRR, S1/S2, garde II-III systolic murmur Lungs:  Normal WOB, breath sounds diminished bilaterally, no wheezing Abdomen:  Distended, soft, non-tender, normal bowel sounds.  GU: Rectal exam, deferred since it was already performed in the ED. Extremities: severe venous stasis discoloration with ulceration bilaterally, +4 edema Musculoskeletal: +rom in BLU/BLE Skin:  Pale, sacral ulcer with dry occlusive dressing  LABS:  BMET  Recent Labs Lab 12/21/16 0540 12/26/16 2351  NA 133* 129*  K 3.7 5.5*  CL 99* 93*  CO2 27 22  BUN 27* 39*  CREATININE 1.04 1.71*  GLUCOSE 109* 142*    Electrolytes  Recent Labs Lab 12/21/16 0540 12/26/16 2351  CALCIUM 7.8* 8.2*    CBC  Recent Labs Lab 12/21/16 0540 12/26/16 2313  WBC 12.6* 27.6*  HGB 9.0* 10.6*  HCT 26.8* 31.2*  PLT 81* 258    Coag's  Recent Labs Lab 12/26/16 2313  APTT 38*  INR 2.29    Sepsis Markers  Recent Labs Lab 12/26/16 2351 12/27/16 0601 12/27/16 0649  LATICACIDVEN  --  2.8* 2.9*  PROCALCITON 1.86  --   --     ABG No results for input(s): PHART, PCO2ART, PO2ART in the last 168 hours.  Liver Enzymes  Recent Labs Lab 12/26/16 2351  AST 54*  ALT 27  ALKPHOS 153*  BILITOT 2.0*  ALBUMIN 2.2*    Cardiac Enzymes No results for input(s): TROPONINI, PROBNP in the last 168 hours.  Glucose No results for input(s): GLUCAP in the last 168 hours.  Imaging Dg Chest Portable 1  View  Result Date: 12/27/2016 CLINICAL DATA:  Central line placement EXAM: PORTABLE CHEST 1 VIEW COMPARISON:  Chest radiograph 12/26/2016 FINDINGS: Right subclavian approach central venous catheter tip overlies the lower SVC. The remainder the examination is unchanged. IMPRESSION: Right subclavian central venous catheter tip overlying the lower superior vena cava. Electronically Signed   By: Deatra RobinsonKevin  Herman M.D.   On: 12/27/2016 05:18   Dg Chest Portable 1 View  Result Date: 12/27/2016 CLINICAL DATA:  Cough EXAM: PORTABLE CHEST 1 VIEW COMPARISON:  Chest radiograph 12/15/2016 FINDINGS: Unchanged cardiomegaly. No focal airspace consolidation or pulmonary edema. No pneumothorax or sizable pleural effusion. IMPRESSION: Unchanged cardiomegaly without overt pulmonary edema. Electronically Signed   By: Deatra RobinsonKevin  Herman M.D.   On: 12/27/2016 00:10     STUDIES:  None  CULTURES: Blood cultures x 2 Wound culture  ANTIBIOTICS: Vancomycin 12/26/16 Zosyn 12/26/16  SIGNIFICANT EVENTS: 12/26/16: ED with bleeding peri-rectal/sacral pressure ulcers, hypotension and fever  LINES/TUBES: Right Subclavian TLC  DISCUSSION: 56 y/o WM with multiple comorbidities presenting with sepsis likely from necrotic sacral ulcers,  bleeding decubitus ulcers  ASSESSMENT / PLAN:  CARDIOVASCULAR A:  Septic shock H/O Congestive heart failure; Echo 12/18/16; EF=45% H/O Atrial fibrillation on anticoagulation Lymphedema PVD P:  Fluid resuscitation Hold anticoagulation in light of bleeding decub ulcers Hold lotensin, and lasix 2/2 hypotension --Gentle hydration.   RENAL A:   AKI on CKD.  Hyperkalemia P:   IV fluids-gentle.  Will check repeat labs.   GASTROINTESTINAL A:   Alcoholic liver cirrhosis Ascites P:   GI consult for paracentesis/cirhosis as needed  HEMATOLOGIC A:   Blood loss anemia-Hg/HCT stable. P:  Monitor HG/HCT and transfuse if Hg<7  INFECTIOUS A:   Sepsis-likely from decub ulcers-- and/or  hemorrhagic shock.  P:   F/U culture Abx as above Trend procalcitonin  Intergumentary A:   Necrotic /infected decubitus ulcers P:   Surgical  And wound care consults Daily dressings per wound care recommendations  FAMILY  - Updates: No family at bedside. Will update when available.  - Inter-disciplinary family meet or Palliative Care meeting due by:  day 7  Plan of care discussed with Dr. Nelly Rout. [facility] ANP-BC Pulmonary and Critical Care Medicine [facility] Pager [phone] or [phone] 12/27/2016, 8:50 AM   Pt seen and examined with NP, above amended note represents my findings, assessment and plan.  Continue hypotension likely from septic shock/hemorrhagic shock due to infected decub ulcers vs. SBP. Bleeding from possible sinus tract/fistula from decub ulcers.  Continue to hold anticoagulation (pt was on eliquis for afib), continue abx. Wean down levophed.   Wells Guiles, M.D.  12/27/2016  Critical Care Attestation.  I have personally obtained a history, examined the patient, evaluated laboratory and imaging results, formulated the assessment and plan and placed orders. The Patient requires high complexity decision making for assessment and support, frequent evaluation and titration of therapies, application of advanced monitoring technologies and extensive interpretation of multiple databases. The patient has critical illness that could lead imminently to failure of 1 or more organ systems and requires the highest level of physician preparedness to intervene.  Critical Care Time devoted to patient care services described in this note is 45 minutes and is exclusive of time spent in procedures.

## 2016-12-27 NOTE — ED Notes (Signed)
Patient's BP increased to 96/64

## 2016-12-27 NOTE — ED Notes (Signed)
Pt denies any pain pt reaching to RQU with his hand denies any pain

## 2016-12-27 NOTE — ED Provider Notes (Signed)
[facility] Emergency Department Provider Note   ____________________________________________   First MD Initiated Contact with Patient 12/26/16 1104     (approximate)  I have reviewed the triage vital signs and the nursing notes.   HISTORY  Chief Complaint Skin Ulcer (Sacraum bleeding )   HPI [name] is a 56 y.o. male who comes in by EMS emergency transport with reported massive rectal bleeding. EMS reports that they could not get a blood pressure. On arrival in the emergency room patient is transferred onto the stretcher and log rolled. He is not bleeding from his rectum rectal exam shows no blood he is bleeding from 2 ulcers immediately above the rectum one is about 4 cm in size and very necrotic but oozing blood the other one is quarter sized but with blood oozing out of it still. A large number of blood soaked shocks and tells removed from underneath of the patient. Initial blood pressure was 90 systolic and initial heart rate was 123 when the patient was connected to the monitor. I should add that I saw the patient come in by EMS and was already in the room when he got there. Patient was awake and would answer questions yes or no but could not provide any further details. He did not answer all questions seem to be somewhat out of it. His lips were white. Patient's vital signs gradually stabilized. Transfusion with the emergency release blood was started because of the large amount of blood that was present and the fact that I did not know if he would bleed again. Patient has a history of being on [place], he has cirrhosis and atrial fibrillation. The wound was packed first with Surgicel and then for by fours. This was covered with duodenum. Surgery was notified and came down immediately to see the patient but felt that that management at the present time was adequate.   Past Medical History:  Diagnosis Date  . Anal fissure   . Atrial fibrillation (HCC)    . CHF (congestive heart failure) (HCC)   . Heart murmur   . Hypertension   . Lymphadenopathy   . Personal history of colonic polyps     Patient Active Problem List   Diagnosis Date Noted  . Pressure injury of skin 12/16/2016  . Acute renal failure (ARF) (HCC) 12/15/2016  . Anemia 12/15/2016  . 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 Hyponatremia 04/17/2016    Past Surgical History:  Procedure Laterality Date  . COLONOSCOPY  2012  . ESOPHAGOGASTRODUODENOSCOPY (EGD) WITH PROPOFOL N/A 12/18/2016   Procedure: ESOPHAGOGASTRODUODENOSCOPY (EGD) WITH PROPOFOL;  Surgeon: Toney Reil, MD;  Location: [facility] ENDOSCOPY;  Service: Endoscopy;  Laterality: N/A;  . HERNIA REPAIR  2012  . larynx-amyloidosis-laser surgery   2010    Prior to Admission medications   Medication Sig Start Date End Date Taking? Authorizing Provider  apixaban (ELIQUIS) 5 MG TABS tablet Take 1 tablet (5 mg total) by mouth 2 (two) times daily. 12/21/16  Yes Vipul Sherryll Burger, MD  benazepril (LOTENSIN) 40 MG tablet Take 1 tablet (40 mg total) by mouth daily. 12/21/16  Yes Vipul Sherryll Burger, MD  folic acid (FOLVITE) 1 MG tablet Take 1 tablet (1 mg total) by mouth daily. 12/21/16  Yes Vipul Sherryll Burger, MD  furosemide (LASIX) 40 MG tablet Take 40 mg by  mouth daily.    Yes Historical Provider, MD  naproxen sodium (ALEVE) 220 MG tablet Take 440 mg by mouth 2 (two) times daily with a meal.   Yes Historical Provider, MD  potassium chloride SA (K-DUR,KLOR-CON) 20 MEQ tablet Take 10 mEq by mouth daily.    Yes Historical Provider, MD  sotalol (BETAPACE) 80 MG tablet Take 1 tablet (80 mg total) by mouth 2 (two) times daily. 12/21/16  Yes Delfino LovettVipul Shah, MD    Allergies Clindamycin/lincomycin  Family History  Problem Relation Age of Onset  . Hypertension Other   . Hypertension Brother      Social History Social History  Substance Use Topics  . Smoking status: Current Every Day Smoker    Packs/day: 1.00    Years: 20.00  . Smokeless tobacco: Never Used  . Alcohol use Yes    Review of Systems   Review of systems could not be obtained as patient was not answering the questions  ____________________________________________   PHYSICAL EXAM:  VITAL SIGNS: ED Triage Vitals  Enc Vitals Group     BP 12/26/16 2316 (!) 112/95     Pulse Rate 12/26/16 2316 (!) 104     Resp 12/26/16 2316 (!) 29     Temp 12/26/16 2316 97.8 F (36.6 C)     Temp Source 12/27/16 0006 Axillary     SpO2 12/26/16 2320 100 %     Weight 12/26/16 2320 (!) 324 lb (147 kg)     Height 12/26/16 2320 6\' 4"  (1.93 m)     Head Circumference --      Peak Flow --      Pain Score --      Pain Loc --      Pain Edu? --      Excl. in GC? --    Constitutional: Awake and answering some questions following some commands Eyes: Conjunctivae are normal. PERRL. EOMI. Head: Atraumatic. Nose: No congestion/rhinnorhea. Mouth/Throat: Mucous membranes are moist.  Oropharynx non-erythematous. Neck: No stridor. Cardiovascular: Rapid rate, regular rhythm. Grossly normal heart sounds.  Good peripheral circulation. Respiratory: Normal respiratory effort.  No retractions. Lungs CTAB. Patient has a weak cough and is coughing almost nonstop. Gastrointestinal: Soft and distended somewhat tender. No abdominal bruits. No CVA tenderness. Bruising of the right lower quadrant of the abdomen Genitourinary: Markedly edematous penis and scrotum patient has the sacral decubitus as described Musculoskeletal: No lower extremity tenderness legs are massively edematous No joint effusions.  ____________________________________________   LABS (all labs ordered are listed, but only abnormal results are displayed)  Labs Reviewed  PROTIME-INR - Abnormal; Notable for the following:       Result Value   Prothrombin Time 25.6 (*)     All other components within normal limits  APTT - Abnormal; Notable for the following:    aPTT 38 (*)    All other components within normal limits  CBC WITH DIFFERENTIAL/PLATELET - Abnormal; Notable for the following:    WBC 27.6 (*)    RBC 3.10 (*)    Hemoglobin 10.6 (*)    HCT 31.2 (*)    MCV 100.8 (*)    MCH 34.3 (*)    RDW 17.1 (*)    Neutro Abs 26.3 (*)    Lymphs Abs 0.9 (*)    All other components within normal limits  COMPREHENSIVE METABOLIC PANEL - Abnormal; Notable for the following:    Sodium 129 (*)    Potassium 5.5 (*)    Chloride 93 (*)  Glucose, Bld 142 (*)    BUN 39 (*)    Creatinine, Ser 1.71 (*)    Calcium 8.2 (*)    Albumin 2.2 (*)    AST 54 (*)    Alkaline Phosphatase 153 (*)    Total Bilirubin 2.0 (*)    GFR calc non Af Amer 43 (*)    GFR calc Af Amer 50 (*)    All other components within normal limits  URINE CULTURE  CULTURE, BLOOD (ROUTINE X 2)  CULTURE, BLOOD (ROUTINE X 2)  URINALYSIS, COMPLETE (UACMP) WITH MICROSCOPIC  URINALYSIS, ROUTINE W REFLEX MICROSCOPIC  I-STAT CG4 LACTIC ACID, ED  I-STAT CG4 LACTIC ACID, ED  TYPE AND SCREEN  PREPARE RBC (CROSSMATCH)  TYPE AND SCREEN  TYPE AND SCREEN  TRANSFUSION REACTION   ____________________________________________  EKG  EKG read and interpreted by me shows sinus tachycardia rate of 106 normal axis and occasional PVCs. Irregular baseline no obvious acute ST-T wave changes ____________________________________________  RADIOLOGY  Study Result   CLINICAL DATA:  Cough  EXAM: PORTABLE CHEST 1 VIEW  COMPARISON:  Chest radiograph 12/15/2016  FINDINGS: Unchanged cardiomegaly. No focal airspace consolidation or pulmonary edema. No pneumothorax or sizable pleural effusion.  IMPRESSION: Unchanged cardiomegaly without overt pulmonary edema.   Electronically Signed   By: Deatra Robinson M.D.   On: 12/27/2016 00:10      ____________________________________________   PROCEDURES  Procedure(s) performed:  Procedures  Critical Care performed:  ____________________________________________   INITIAL IMPRESSION / ASSESSMENT AND PLAN / ED COURSE  Pertinent labs & imaging results that were available during my care of the patient were reviewed by me and considered in my medical decision making (see chart for details).   Clinical Course    Patient's blood work was back with a white count 27,000. Patient develops a fever after beginning blood transfusion. I believe because the white count being so high in the 1 blood for that being drawn before the transfusion and the fact that the patient's sacral ulcer smells rotten and looks necrotic at the fever is from his sacral ulcer which is infected. However because the fever came on after the blood transfusion I will work him up  for transfusion reaction as well. Patient is not having any itching or burning change in any part of his status. I should add that there was no communication between the rectum and the ulcers and the CBC was done before the transfusion was started  Dr. Sheryle Hail has just been made aware of the patient. I will give the patient a total of 2 more liters of warm saline. I will have to give these to him carefully because of his massive edema and ascites and atrial fibrillation and the fact that I do not want to put him into congestive heart failure. I do not see a history of congestive heart failure but he does take Lasix and Benzapril.  Patient does not want to get a CT of his abdomen and pelvis I explained to him that it is useful and important to try to find out how bad his infection is but he still does not want it.  ____________________________________________   FINAL CLINICAL IMPRESSION(S) / ED DIAGNOSES  Final diagnoses:  Sepsis, due to unspecified organism [facility])   Further diagnosis is necrotic skin ulcer.  Massive bleeding from  skin ulcer. Cirrhosis.   NEW MEDICATIONS STARTED DURING THIS VISIT:  New Prescriptions   No medications on file     Note:  This document was  prepared using Conservation officer, historic buildings and may include unintentional dictation errors.    Arnaldo Natal, MD 12/27/16 [phone]

## 2016-12-27 NOTE — Consult Note (Signed)
Patient ID: [name], male   DOB: 1961/10/05, 56 y.o.   MRN: [id number]  CC: Decubitus Ulcer  HPI [name] is a 56 y.o. male who presented to the ER last night secondary to a bleeding decubitus/perirectal ulcer. He presented to the emergency department hypotensive and tachycardic. Patient has a known history of previous bleeding disorders, alcoholic cirrhosis, CHF, anticoagulation for A. fib. After removal of the initial blood soaked dressings and being replaced by Surgicel and gauze by the emergency department the bleeding appears to have subsided. Dressing placed by the emergency department still intact 6 hours later. Patient responded to fluid resuscitation but did require some assistance of vasopressors. Patient only partially response to questions but does appear to follow commands.  HPI  Past Medical History:  Diagnosis Date  . Anal fissure   . Atrial fibrillation (HCC)   . CHF (congestive heart failure) (HCC)   . Heart murmur   . Hypertension   . Lymphadenopathy   . Personal history of colonic polyps   Cirrhosis  Past Surgical History:  Procedure Laterality Date  . COLONOSCOPY  2012  . ESOPHAGOGASTRODUODENOSCOPY (EGD) WITH PROPOFOL N/A 12/18/2016   Procedure: ESOPHAGOGASTRODUODENOSCOPY (EGD) WITH PROPOFOL;  Surgeon: Toney Reil, MD;  Location: [facility] ENDOSCOPY;  Service: Endoscopy;  Laterality: N/A;  . HERNIA REPAIR  2012  . larynx-amyloidosis-laser surgery   2010    Family History  Problem Relation Age of Onset  . Hypertension Other   . Hypertension Brother     Social History Social History  Substance Use Topics  . Smoking status: Current Every Day Smoker    Packs/day: 1.00    Years: 20.00  . Smokeless tobacco: Never Used  . Alcohol use Yes    Allergies  Allergen Reactions  . Clindamycin/Lincomycin Diarrhea    Current Facility-Administered Medications  Medication Dose Route Frequency Provider Last Rate Last Dose  . 0.9 %  sodium chloride  infusion   Intravenous Continuous Arnaldo Natal, MD      . acetaminophen (TYLENOL) tablet 650 mg  650 mg Oral Q6H PRN Arnaldo Natal, MD       Or  . acetaminophen (TYLENOL) suppository 650 mg  650 mg Rectal Q6H PRN Arnaldo Natal, MD      . Melene Muller ON 12/28/2016] benazepril (LOTENSIN) tablet 40 mg  40 mg Oral Daily Arnaldo Natal, MD      . ciprofloxacin (CIPRO) tablet 500 mg  500 mg Oral BID Arnaldo Natal, MD      . diphenhydrAMINE (BENADRYL) injection 12.5 mg  12.5 mg Intravenous Once Arnaldo Natal, MD      . docusate sodium (COLACE) capsule 100 mg  100 mg Oral BID Arnaldo Natal, MD      . folic acid (FOLVITE) tablet 1 mg  1 mg Oral Daily Arnaldo Natal, MD      . furosemide (LASIX) tablet 40 mg  40 mg Oral Daily Arnaldo Natal, MD      . lidocaine (PF) (XYLOCAINE) 1 % injection           . norepinephrine (LEVOPHED) 16 mg in dextrose 5 % 250 mL (0.064 mg/mL) infusion  0-65 mcg/min Intravenous Titrated Lewie Loron, NP      . ondansetron (ZOFRAN) tablet 4 mg  4 mg Oral Q6H PRN Arnaldo Natal, MD       Or  . ondansetron [facility]) injection 4 mg  4 mg Intravenous Q6H PRN Arnaldo Natal,  MD      . piperacillin-tazobactam (ZOSYN) IVPB 4.5 g  4.5 g Intravenous Q8H Shane CrutchPradeep Ramachandran, MD      . Melene Muller[START ON 12/28/2016] potassium chloride (K-DUR,KLOR-CON) CR tablet 10 mEq  10 mEq Oral Daily Arnaldo NatalMichael S Diamond, MD      . sodium chloride flush (NS) 0.9 % injection 3 mL  3 mL Intravenous Q12H Arnaldo NatalMichael S Diamond, MD      . sotalol (BETAPACE) tablet 80 mg  80 mg Oral BID Arnaldo NatalMichael S Diamond, MD         Review of Systems Unable to be obtained secondary to patient's mental status  Physical Exam Blood pressure (!) 86/56, pulse 82, temperature 98.5 F (36.9 C), temperature source Axillary, resp. rate 19, height 6\' 4"  (1.93 m), weight (!) 147 kg (324 lb), SpO2 97 %. CONSTITUTIONAL: Resting in bed  EYES: Pupils are equal, round, and reactive to light,  EARS, NOSE, MOUTH  AND THROAT: The oropharynx is clear. Hearing appears intact to voice. LYMPH NODES:  Lymph nodes in the neck are unable to be palpated secondary to body habitus RESPIRATORY:  Lungs are coarse bilaterally. There is normal respiratory effort, with equal breath sounds bilaterally. CARDIOVASCULAR: Heart is regular  GI: The abdomen is very large, soft, mildly tender to deep palpation, and mildly distended. Multiple areas of ecchymosis GU: Edematous perineum, scrotum, penis. Sacral decubitus ulcers currently with a dressing in place without evidence of continued bleeding   MUSCULOSKELETAL: Bilateral lower extremity edema  SKIN: Turgor is good and there are no pathologic skin lesions or ulcers. NEUROLOGIC: Motor function appears grossly intact PSYCH:  Unable to fully assess due to mental status  Data Reviewed Images and labs reviewed. Labs concerning for leukocytosis of 27.6, bilirubin of 2.0, albumin is 2.2, creatinine 1.71, potassium of 5.5, sodium of 129, INR of 2.29. Chest x-ray shows cardiomegaly. No cross-sectional images I have personally reviewed the patient's imaging, laboratory findings and medical records.    Assessment    Sepsis, decubitus ulcers    Plan    56 year old male presented emergency department with apparent signs of sepsis and that he was hypotensive, tachycardic and then became febrile. He does have evidence of extensive sacral decubitus ulcers with an area of bleeding and an area of necrosis that was visualized on admission. Bleeding currently appears to be controlled with local measures performed by the emergency physician. Patient has required fluid resuscitation and intermittent pressors to maintain a blood pressure. He has numerous comorbidities. At this point recommend continued workup by critical care medicine for resuscitation and to fully evaluate the source of his sepsis. Given his cirrhosis, CHF, acute kidney injury he would be a poor operative candidate should he  require any type of surgery. Surgery will continue to follow along for the decubitus ulcers. Possible bedside debridement if he continues to improve. If he markedly improves then potential trip to the operating room but this would require significant improvement.     Time spent with the patient was 80 minutes, with more than 50% of the time spent in face-to-face education, counseling and care coordination.     Ricarda Frameharles Malavika Lira, MD FACS General Surgeon 12/27/2016, 6:39 AM

## 2016-12-27 NOTE — Progress Notes (Signed)
Dr. Nicholos Johnsamachandran present in ICU and RN made MD aware that patient has been off levophed drip since 1200.  RN asked if patient could be stepdown status and MD gave order to transfer patient to stepdown.

## 2016-12-27 NOTE — Progress Notes (Signed)
MEDICATION RELATED CONSULT NOTE   Pharmacy Consult for Zosyn and Electrolyte Monitoring/Replacement Indication: sepsis possibly from sacral area  Assessment: 55yoM on home apixaban, who presents with sacral bleeding given KCETNRA. Pt found to be septic. Pharmacy consulted for Zosyn dosing.   Plan:  1. Continue Zosyn 4.5g IV Q8h due to patient weight of 143.7kg.   2. Magnesium sulfate 4 g iv once and will f/u AM labs.    Allergies  Allergen Reactions  . Clindamycin/Lincomycin Diarrhea    Patient Measurements: Height: 6\' 4"  (193 cm) Weight: (!) 316 lb 12.8 oz (143.7 kg) IBW/kg (Calculated) : 86.8  Vital Signs: Temp: 97.6 F (36.4 C) (01/15 1300) Temp Source: Oral (01/15 1300) BP: 104/91 (01/15 1430) Pulse Rate: 87 (01/15 1430) Intake/Output from previous day: 01/14 0701 - 01/15 0700 In: 3471.8 [I.V.:1221.8; IV Piggyback:2250] Out: -  Intake/Output from this shift: Total I/O In: 798.8 [P.O.:60; I.V.:138.8; IV Piggyback:600] Out: -   Labs:  Recent Labs  12/26/16 2313 12/26/16 2351 12/27/16 1131  WBC 27.6*  --  25.9*  HGB 10.6*  --  8.3*  HCT 31.2*  --  24.1*  PLT 258  --  173  APTT 38*  --   --   CREATININE  --  1.71* 1.45*  MG  --   --  1.1*  ALBUMIN  --  2.2*  --   PROT  --  7.2  --   AST  --  54*  --   ALT  --  27  --   ALKPHOS  --  153*  --   BILITOT  --  2.0*  --    Estimated Creatinine Clearance: 88.2 mL/min (by C-G formula based on SCr of 1.45 mg/dL (H)).   Microbiology: Recent Results (from the past 720 hour(s))  Body fluid culture     Status: None   Collection Time: 12/17/16  1:15 PM  Result Value Ref Range Status   Specimen Description PERITONEAL  Final   Special Requests NONE  Final   Gram Stain   Final    FEW WBC PRESENT,BOTH PMN AND MONONUCLEAR NO ORGANISMS SEEN    Culture   Final    No growth aerobically or anaerobically. Performed at [facility]    Report Status 12/20/2016 FINAL  Final  Culture, blood (routine x 2)      Status: None (Preliminary result)   Collection Time: 12/27/16 12:40 AM  Result Value Ref Range Status   Specimen Description BLOOD RIGHT HAND  Final   Special Requests   Final    BOTTLES DRAWN AEROBIC AND ANAEROBIC AERO5ML,ANAERO4ML   Culture NO GROWTH < 12 HOURS  Final   Report Status PENDING  Incomplete  Culture, blood (routine x 2)     Status: None (Preliminary result)   Collection Time: 12/27/16 12:40 AM  Result Value Ref Range Status   Specimen Description BLOOD LEFT HAND  Final   Special Requests   Final    BOTTLES DRAWN AEROBIC AND ANAEROBIC AERO3ML,ANAERO10ML   Culture NO GROWTH < 12 HOURS  Final   Report Status PENDING  Incomplete  MRSA PCR Screening     Status: None   Collection Time: 12/27/16  6:30 AM  Result Value Ref Range Status   MRSA by PCR NEGATIVE NEGATIVE Final    Comment:        The GeneXpert MRSA Assay (FDA approved for NASAL specimens only), is one component of a comprehensive MRSA colonization surveillance program. It is not intended to  diagnose MRSA infection nor to guide or monitor treatment for MRSA infections.     Antibiotics: Cipro 1/15 >>1/15 Vancomycin 1/15 >>1/15 Zosyn 1/15 >>  Cultures: 1/15 BCx: NGTD 1/15 MRSA PCR: negative  1/15 UCx: sent    Valentina Guhristy, Cozy Veale D, PharmD 12/27/2016,3:08 PM

## 2016-12-27 NOTE — Progress Notes (Signed)
During rounds Dr. Nicholos Johnsamachandran gave order to titrate levophed drip to maintain systolic of 90 or greater.

## 2016-12-27 NOTE — ED Notes (Signed)
[employer]ed [employer]

## 2016-12-27 NOTE — Consult Note (Signed)
[name] Minium, MD [facility]  [address], [address] [place], [place] [address] Phone: [phone] Fax : [phone]  Consultation  Referring Provider:     Dr.  Nicholos Johns Primary Care Physician:  Corky Downs, MD Primary Gastroenterologist:  None         Reason for Consultation:     Rectal bleeding  Date of Admission:  12/26/2016 Date of Consultation:  12/27/2016         HPI:   [name] is a 56 y.o. male who was admitted with rectal bleeding. The patient is not able to give much history of his rectal bleeding since he states he went to the bathroom and came down etc. On the couch.  Some time later the patient's girlfriend told the patient that he had blood on the back of his pants the patient also has a perirectal ulcer.  The patient has a history of alcoholic cirrhosis although he states he has stopped drinking recently.  The patient was in the hospital up until the ninth of this month for anemia acute renal failure and generalized weakness.  He also had hypotension at that admission and a paracentesis with 6.2 L of fluid taken off.  The patient denies any rectal pain and abdominal pain nausea vomiting fevers or chills. I'm now being consult and for the patient's ascites and cirrhosis.The patient did have a CT scan of the abdomen today that showed him to have cirrhosis ascites and third spacing of fluid.  The patient's sacral decubitus ulcer also headache gas extending to the Coccyx and lower sacrum.  Past Medical History:  Diagnosis Date  . Anal fissure   . Atrial fibrillation (HCC)   . CHF (congestive heart failure) (HCC)   . Heart murmur   . Hypertension   . Lymphadenopathy   . Personal history of colonic polyps     Past Surgical History:  Procedure Laterality Date  . COLONOSCOPY  2012  . ESOPHAGOGASTRODUODENOSCOPY (EGD) WITH PROPOFOL N/A 12/18/2016   Procedure: ESOPHAGOGASTRODUODENOSCOPY (EGD) WITH PROPOFOL;  Surgeon: Toney Reil, MD;  Location: [facility] ENDOSCOPY;   Service: Endoscopy;  Laterality: N/A;  . HERNIA REPAIR  2012  . larynx-amyloidosis-laser surgery   2010    Prior to Admission medications   Medication Sig Start Date End Date Taking? Authorizing Provider  apixaban (ELIQUIS) 5 MG TABS tablet Take 1 tablet (5 mg total) by mouth 2 (two) times daily. 12/21/16  Yes Vipul Sherryll Burger, MD  benazepril (LOTENSIN) 40 MG tablet Take 1 tablet (40 mg total) by mouth daily. 12/21/16  Yes Vipul Sherryll Burger, MD  folic acid (FOLVITE) 1 MG tablet Take 1 tablet (1 mg total) by mouth daily. 12/21/16  Yes Vipul Sherryll Burger, MD  furosemide (LASIX) 40 MG tablet Take 40 mg by mouth daily.    Yes Historical Provider, MD  naproxen sodium (ALEVE) 220 MG tablet Take 440 mg by mouth 2 (two) times daily with a meal.   Yes Historical Provider, MD  potassium chloride SA (K-DUR,KLOR-CON) 20 MEQ tablet Take 10 mEq by mouth daily.    Yes Historical Provider, MD  sotalol (BETAPACE) 80 MG tablet Take 1 tablet (80 mg total) by mouth 2 (two) times daily. 12/21/16  Yes Delfino Lovett, MD    Family History  Problem Relation Age of Onset  . Hypertension Other   . Hypertension Brother      Social History  Substance Use Topics  . Smoking status: Current Every Day Smoker    Packs/day: 1.00    Years: 20.00  .  Smokeless tobacco: Never Used  . Alcohol use Yes    Allergies as of 12/26/2016 - Review Complete 12/26/2016  Allergen Reaction Noted  . Clindamycin/lincomycin Diarrhea 04/17/2016    Review of Systems:    All systems reviewed and negative except where noted in HPI.   Physical Exam:  Vital signs in last 24 hours: Temp:  [94.5 F (34.7 C)-101.9 F (38.8 C)] 98.7 F (37.1 C) (01/15 1630) Pulse Rate:  [62-104] 62 (01/15 1800) Resp:  [13-40] 16 (01/15 1800) BP: (72-132)/(38-112) 109/77 (01/15 1800) SpO2:  [90 %-100 %] 98 % (01/15 1800) FiO2 (%):  [100 %] 100 % (01/15 0006) Weight:  [316 lb 12.8 oz (143.7 kg)-324 lb (147 kg)] 316 lb 12.8 oz (143.7 kg) (01/15 0630)   General:   Pleasant,  cooperative in NAD Head:  Normocephalic and atraumatic. Eyes:   No icterus.   Conjunctiva pink. PERRLA. Ears:  Normal auditory acuity. Neck:  Supple; no masses or thyroidomegaly Lungs: Respirations even and unlabored. Lungs clear to auscultation bilaterally.   No wheezes, crackles, or rhonchi.  Heart:  Regular rate and rhythm;  Without murmur, clicks, rubs or gallops Abdomen:  Soft, nondistended, nontender. Normal bowel sounds. No appreciable masses or hepatomegaly.  No rebound or guarding.  Rectal:  Not performed. Msk:  Symmetrical without gross deformities.    Extremities:  Without edema, cyanosis or clubbing. Neurologic:  Alert and Slightly confused;  grossly normal neurologically. Skin:  Intact without significant lesions or rashes. Cervical Nodes:  No significant cervical adenopathy. Psych:  Alert and cooperative. Normal affect.  LAB RESULTS:  Recent Labs  12/26/16 2313 12/27/16 1131  WBC 27.6* 25.9*  HGB 10.6* 8.3*  HCT 31.2* 24.1*  PLT 258 173   BMET  Recent Labs  12/26/16 2351 12/27/16 1131  NA 129* 129*  K 5.5* 4.2  CL 93* 97*  CO2 22 24  GLUCOSE 142* 175*  BUN 39* 37*  CREATININE 1.71* 1.45*  CALCIUM 8.2* 7.7*   LFT  Recent Labs  12/26/16 2351  PROT 7.2  ALBUMIN 2.2*  AST 54*  ALT 27  ALKPHOS 153*  BILITOT 2.0*   PT/INR  Recent Labs  12/26/16 2313 12/27/16 1131  LABPROT 25.6* 22.2*  INR 2.29 1.92    STUDIES: Ct Abdomen Pelvis W Contrast  Result Date: 12/27/2016 CLINICAL DATA:  56 year old hypertensive male with bleeding decubitus ulcer presenting to emergency room with hypotension and tachycardia. Alcoholic cirrhosis. Congestive heart. Atrial fibrillation on anticoagulation. Subsequent encounter. EXAM: CT ABDOMEN AND PELVIS WITH CONTRAST TECHNIQUE: Multidetector CT imaging of the abdomen and pelvis was performed using the standard protocol following bolus administration of intravenous contrast. CONTRAST:  75mL ISOVUE-300 IOPAMIDOL  (ISOVUE-300) INJECTION 61% COMPARISON:  No comparison CT of the abdomen and pelvis. Ultrasound for paracentesis 12/17/2016. FINDINGS: Exam is motion degraded. Lower chest: Minimal lung base atelectasis. Aortic root and mitral valve calcification. Coronary artery calcification. Heart size top-normal. Hepatobiliary: Cirrhotic liver without mass identified on this motion degraded exam. Main portal vein appears patent. No calcified gallstone. Pancreas: Motion degraded exam without pancreatic mass identified. Splenic vein appears patent. Spleen: No splenic mass or enlargement. Adrenals/Urinary Tract: Left lower pole 4 mm nonobstructing stone. No hydronephrosis. 5 mm left renal cyst may be present but too small to adequately characterize. No worrisome renal or adrenal lesion. Stomach/Bowel: Evaluation limited by prominent ascites and underdistention. Mild thickening of the antrum/probable may be related to under distension rather than gastritis. Tiny duodenal diverticulum. Scattered colonic diverticula most notable descending colon Vascular/Lymphatic:  Atherosclerotic changes aorta, iliac arteries and femoral arteries. Mild ectasia of the abdominal aorta without focal aneurysm. Narrowing aortic branch vessels without large vessel occlusion. Top-normal size lymph nodes most notable in inguinal region without adenopathy. Reproductive: Partial calcification prostate gland. Foley catheter in place with decompressed urinary bladder. Other: Prominent ascites and third spacing of fluid. Sacral decubitus ulcer with gas extending to the coccyx/lower sacrum. Inflammatory process/ gas extends into the right ischial rectal/ischial anal fossa with mild impression upon the right puborectalis muscle and minimal flattening of the right aspect of the rectum without clear fistula/communication with the rectum. Gas extends to the right operator internus muscle. Musculoskeletal: Remote T12 superior endplate compression fracture with anterior  wedging. Gas within the T11-12 disc. Mild sclerosis upper sacrum. IMPRESSION: Cirrhosis, ascites and third spacing of fluid. Sacral decubitus ulcer with gas extending to the coccyx/lower sacrum. Inflammatory process/ gas extends into the right ischial rectal/ischial anal fossa with mild impression upon the right puborectalis muscle and minimal flattening of the right aspect of the rectum without clear fistula/communication with the rectum. Gas extends to the right operator internus muscle. Limited evaluation of bowel secondary to motion, ascites and underdistention. The slight thickening of the gastric antrum/ pylorus may be related to this underdistention but cannot exclude inflammation. Scattered colonic diverticula. Atherosclerotic changes as detailed above. Electronically Signed   By: Lacy Duverney M.D.   On: 12/27/2016 09:27   Dg Chest Portable 1 View  Result Date: 12/27/2016 CLINICAL DATA:  Central line placement EXAM: PORTABLE CHEST 1 VIEW COMPARISON:  Chest radiograph 12/26/2016 FINDINGS: Right subclavian approach central venous catheter tip overlies the lower SVC. The remainder the examination is unchanged. IMPRESSION: Right subclavian central venous catheter tip overlying the lower superior vena cava. Electronically Signed   By: Deatra Robinson M.D.   On: 12/27/2016 05:18   Dg Chest Portable 1 View  Result Date: 12/27/2016 CLINICAL DATA:  Cough EXAM: PORTABLE CHEST 1 VIEW COMPARISON:  Chest radiograph 12/15/2016 FINDINGS: Unchanged cardiomegaly. No focal airspace consolidation or pulmonary edema. No pneumothorax or sizable pleural effusion. IMPRESSION: Unchanged cardiomegaly without overt pulmonary edema. Electronically Signed   By: Deatra Robinson M.D.   On: 12/27/2016 00:10      Impression / Plan:   [name] is a 56 y.o. y/o male with a history of alcoholic cirrhosis. The patient had an upper endoscopy on January 8th of this year. The upper endoscopy showed the patient to have  hypertensive portal gastropathy.  The patient was now admitted with possible lower GI bleeding although it is quite possible that the bleeding is coming from his sacral ulcer.  If the patient's ascites is symptomatic then the patient will need to be sent down to radiology for a paracentesis.  As far as treating it medically with Aldactone and Lasix it would not be advised with the patient's GI bleeding and risk for hypotension.  Nothing to recommend from a GI point of view except conservative management of this patient.  He is not a liver transplant candidate since he has not abstained from alcohol long enough nor has he received any alcohol cessation counseling.  I discussed the findings with the patient's brother and his father and they agree with the plan.  Thank you for involving me in the care of this patient.      LOS: 0 days   [name] Minium, MD  12/27/2016, 6:19 PM   Note: This dictation was prepared with Dragon dictation along with smaller phrase technology. Any transcriptional  errors that result from this process are unintentional.

## 2016-12-27 NOTE — ED Notes (Signed)
Lab at bedside side

## 2016-12-28 DIAGNOSIS — R579 Shock, unspecified: Secondary | ICD-10-CM

## 2016-12-28 LAB — BASIC METABOLIC PANEL
Anion gap: 7 (ref 5–15)
BUN: 38 mg/dL — ABNORMAL HIGH (ref 6–20)
CALCIUM: 7.9 mg/dL — AB (ref 8.9–10.3)
CO2: 25 mmol/L (ref 22–32)
CREATININE: 1.47 mg/dL — AB (ref 0.61–1.24)
Chloride: 100 mmol/L — ABNORMAL LOW (ref 101–111)
GFR calc Af Amer: 60 mL/min — ABNORMAL LOW (ref >60)
GFR, EST NON AFRICAN AMERICAN: 52 mL/min — AB (ref >60)
GLUCOSE: 161 mg/dL — AB (ref 65–99)
Potassium: 4.2 mmol/L (ref 3.5–5.1)
Sodium: 132 mmol/L — ABNORMAL LOW (ref 135–145)

## 2016-12-28 LAB — CBC
HEMATOCRIT: 26.6 % — AB (ref 40.0–52.0)
Hemoglobin: 9.1 g/dL — ABNORMAL LOW (ref 13.0–18.0)
MCH: 34.3 pg — AB (ref 26.0–34.0)
MCHC: 34.1 g/dL (ref 32.0–36.0)
MCV: 100.5 fL — AB (ref 80.0–100.0)
PLATELETS: 176 10*3/uL (ref 150–440)
RBC: 2.65 MIL/uL — ABNORMAL LOW (ref 4.40–5.90)
RDW: 17.3 % — AB (ref 11.5–14.5)
WBC: 24.4 10*3/uL — ABNORMAL HIGH (ref 3.8–10.6)

## 2016-12-28 LAB — PHOSPHORUS: PHOSPHORUS: 3.6 mg/dL (ref 2.5–4.6)

## 2016-12-28 LAB — TYPE AND SCREEN
ABO/RH(D): A POS
Antibody Screen: NEGATIVE
Unit division: 0
Unit division: 0

## 2016-12-28 LAB — PROTIME-INR
INR: 1.73
PROTHROMBIN TIME: 20.5 s — AB (ref 11.4–15.2)

## 2016-12-28 LAB — MAGNESIUM: Magnesium: 1.6 mg/dL — ABNORMAL LOW (ref 1.7–2.4)

## 2016-12-28 LAB — PROCALCITONIN: Procalcitonin: 6.79 ng/mL

## 2016-12-28 LAB — URINE CULTURE: Culture: NO GROWTH

## 2016-12-28 LAB — HEMOGLOBIN A1C
Hgb A1c MFr Bld: 4.6 % — ABNORMAL LOW (ref 4.8–5.6)
Mean Plasma Glucose: 85 mg/dL

## 2016-12-28 LAB — GLUCOSE, CAPILLARY
Glucose-Capillary: 181 mg/dL — ABNORMAL HIGH (ref 65–99)
Glucose-Capillary: 173 mg/dL — ABNORMAL HIGH (ref 65–99)
Glucose-Capillary: 145 mg/dL — ABNORMAL HIGH (ref 65–99)

## 2016-12-28 MED ORDER — INSULIN ASPART 100 UNIT/ML ~~LOC~~ SOLN
2.0000 [IU] | Freq: Three times a day (TID) | SUBCUTANEOUS | Status: DC
  Administered 2016-12-28: 2 [IU] via SUBCUTANEOUS
  Administered 2016-12-28: 4 [IU] via SUBCUTANEOUS
  Administered 2016-12-29 – 2016-12-30 (×4): 2 [IU] via SUBCUTANEOUS
  Administered 2016-12-30 – 2016-12-31 (×3): 4 [IU] via SUBCUTANEOUS
  Administered 2016-12-31: 2 [IU] via SUBCUTANEOUS
  Administered 2017-01-01: 4 [IU] via SUBCUTANEOUS
  Administered 2017-01-01 – 2017-01-02 (×6): 2 [IU] via SUBCUTANEOUS
  Administered 2017-01-03 (×2): 4 [IU] via SUBCUTANEOUS
  Administered 2017-01-04: 2 [IU] via SUBCUTANEOUS
  Filled 2016-12-28: qty 2
  Filled 2016-12-28 (×2): qty 4
  Filled 2016-12-28: qty 2
  Filled 2016-12-28: qty 4
  Filled 2016-12-28 (×4): qty 2
  Filled 2016-12-28: qty 4
  Filled 2016-12-28: qty 2
  Filled 2016-12-28: qty 4
  Filled 2016-12-28 (×6): qty 2
  Filled 2016-12-28 (×3): qty 4
  Filled 2016-12-28: qty 2

## 2016-12-28 MED ORDER — MAGNESIUM SULFATE 2 GM/50ML IV SOLN
2.0000 g | Freq: Once | INTRAVENOUS | Status: AC
  Administered 2016-12-28: 2 g via INTRAVENOUS
  Filled 2016-12-28: qty 50

## 2016-12-28 NOTE — Progress Notes (Signed)
presented with bleeding from sacral ulcers Wound care and surg evaluated Off of ELIQUIS for atrial fib do not resume  dispo PT/OT SW consult

## 2016-12-28 NOTE — Progress Notes (Signed)
Sizewise bed ordered for patient. Confirmation for bed ordered [id number].

## 2016-12-28 NOTE — Progress Notes (Signed)
[name] Minium, MD [facility]   [address], [address] [place], [place] [address] Phone: [phone] Fax : [phone]   Subjective: The patient remains in the ICU with a simple GI bleeding. The patient is concerned about his family members knowing about his reasons for him being in the hospital and history of alcohol abuse. The patient had a colonoscopy in 2012 by Dr. Juel Burrow with polyps found. One of the polyps was a tubular adenoma. Is no report of any further bleeding. The patient's hemoglobin this morning was 9.1.   Objective: Vital signs in last 24 hours: Vitals:   12/28/16 0715 12/28/16 0800 12/28/16 0900 12/28/16 1000  BP: 93/68 93/71 94/67  99/85  Pulse: 97 96 96 94  Resp: 13 17 15 20   Temp:  98 F (36.7 C)    TempSrc:  Oral    SpO2: 95% 95% 97% 98%  Weight:      Height:       Weight change: -3 lb 3.7 oz (-1.465 kg)  Intake/Output Summary (Last 24 hours) at 12/28/16 1220 Last data filed at 12/28/16 1030  Gross per 24 hour  Intake          1199.54 ml  Output             1450 ml  Net          -250.46 ml     Exam: Heart:: Regular rate and rhythm, S1S2 present or without murmur or extra heart sounds Lungs: normal and clear to auscultation and percussion Abdomen: soft, nontender, normal bowel sounds   Lab Results: @LABTEST2 @ Micro Results: Recent Results (from the past 240 hour(s))  Culture, blood (routine x 2)     Status: None (Preliminary result)   Collection Time: 12/27/16 12:40 AM  Result Value Ref Range Status   Specimen Description BLOOD RIGHT HAND  Final   Special Requests   Final    BOTTLES DRAWN AEROBIC AND ANAEROBIC AERO5ML,ANAERO4ML   Culture NO GROWTH 1 DAY  Final   Report Status PENDING  Incomplete  Culture, blood (routine x 2)     Status: None (Preliminary result)   Collection Time: 12/27/16 12:40 AM  Result Value Ref Range Status   Specimen Description BLOOD LEFT HAND  Final   Special Requests   Final    BOTTLES DRAWN AEROBIC AND  ANAEROBIC AERO3ML,ANAERO10ML   Culture NO GROWTH 1 DAY  Final   Report Status PENDING  Incomplete  MRSA PCR Screening     Status: None   Collection Time: 12/27/16  6:30 AM  Result Value Ref Range Status   MRSA by PCR NEGATIVE NEGATIVE Final    Comment:        The GeneXpert MRSA Assay (FDA approved for NASAL specimens only), is one component of a comprehensive MRSA colonization surveillance program. It is not intended to diagnose MRSA infection nor to guide or monitor treatment for MRSA infections.    Studies/Results: Ct Abdomen Pelvis W Contrast  Result Date: 12/27/2016 CLINICAL DATA:  56 year old hypertensive male with bleeding decubitus ulcer presenting to emergency room with hypotension and tachycardia. Alcoholic cirrhosis. Congestive heart. Atrial fibrillation on anticoagulation. Subsequent encounter. EXAM: CT ABDOMEN AND PELVIS WITH CONTRAST TECHNIQUE: Multidetector CT imaging of the abdomen and pelvis was performed using the standard protocol following bolus administration of intravenous contrast. CONTRAST:  75mL ISOVUE-300 IOPAMIDOL (ISOVUE-300) INJECTION 61% COMPARISON:  No comparison CT of the abdomen and pelvis. Ultrasound for paracentesis 12/17/2016. FINDINGS: Exam is motion degraded. Lower chest: Minimal lung base  atelectasis. Aortic root and mitral valve calcification. Coronary artery calcification. Heart size top-normal. Hepatobiliary: Cirrhotic liver without mass identified on this motion degraded exam. Main portal vein appears patent. No calcified gallstone. Pancreas: Motion degraded exam without pancreatic mass identified. Splenic vein appears patent. Spleen: No splenic mass or enlargement. Adrenals/Urinary Tract: Left lower pole 4 mm nonobstructing stone. No hydronephrosis. 5 mm left renal cyst may be present but too small to adequately characterize. No worrisome renal or adrenal lesion. Stomach/Bowel: Evaluation limited by prominent ascites and underdistention. Mild  thickening of the antrum/probable may be related to under distension rather than gastritis. Tiny duodenal diverticulum. Scattered colonic diverticula most notable descending colon Vascular/Lymphatic: Atherosclerotic changes aorta, iliac arteries and femoral arteries. Mild ectasia of the abdominal aorta without focal aneurysm. Narrowing aortic branch vessels without large vessel occlusion. Top-normal size lymph nodes most notable in inguinal region without adenopathy. Reproductive: Partial calcification prostate gland. Foley catheter in place with decompressed urinary bladder. Other: Prominent ascites and third spacing of fluid. Sacral decubitus ulcer with gas extending to the coccyx/lower sacrum. Inflammatory process/ gas extends into the right ischial rectal/ischial anal fossa with mild impression upon the right puborectalis muscle and minimal flattening of the right aspect of the rectum without clear fistula/communication with the rectum. Gas extends to the right operator internus muscle. Musculoskeletal: Remote T12 superior endplate compression fracture with anterior wedging. Gas within the T11-12 disc. Mild sclerosis upper sacrum. IMPRESSION: Cirrhosis, ascites and third spacing of fluid. Sacral decubitus ulcer with gas extending to the coccyx/lower sacrum. Inflammatory process/ gas extends into the right ischial rectal/ischial anal fossa with mild impression upon the right puborectalis muscle and minimal flattening of the right aspect of the rectum without clear fistula/communication with the rectum. Gas extends to the right operator internus muscle. Limited evaluation of bowel secondary to motion, ascites and underdistention. The slight thickening of the gastric antrum/ pylorus may be related to this underdistention but cannot exclude inflammation. Scattered colonic diverticula. Atherosclerotic changes as detailed above. Electronically Signed   By: Lacy Duverney M.D.   On: 12/27/2016 09:27   Dg Chest  Portable 1 View  Result Date: 12/27/2016 CLINICAL DATA:  Central line placement EXAM: PORTABLE CHEST 1 VIEW COMPARISON:  Chest radiograph 12/26/2016 FINDINGS: Right subclavian approach central venous catheter tip overlies the lower SVC. The remainder the examination is unchanged. IMPRESSION: Right subclavian central venous catheter tip overlying the lower superior vena cava. Electronically Signed   By: Deatra Robinson M.D.   On: 12/27/2016 05:18   Dg Chest Portable 1 View  Result Date: 12/27/2016 CLINICAL DATA:  Cough EXAM: PORTABLE CHEST 1 VIEW COMPARISON:  Chest radiograph 12/15/2016 FINDINGS: Unchanged cardiomegaly. No focal airspace consolidation or pulmonary edema. No pneumothorax or sizable pleural effusion. IMPRESSION: Unchanged cardiomegaly without overt pulmonary edema. Electronically Signed   By: Deatra Robinson M.D.   On: 12/27/2016 00:10   Medications: I have reviewed the patient's current medications. Scheduled Meds: . ammonium lactate   Topical Daily  . benazepril  40 mg Oral Daily  . diphenhydrAMINE  12.5 mg Intravenous Once  . docusate sodium  100 mg Oral BID  . folic acid  1 mg Oral Daily  . furosemide  40 mg Oral Daily  . insulin aspart  2-6 Units Subcutaneous TID AC & HS  . piperacillin-tazobactam (ZOSYN)  IV  4.5 g Intravenous Q8H  . pneumococcal 23 valent vaccine  0.5 mL Intramuscular Tomorrow-1000  . potassium chloride SA  10 mEq Oral Daily  . sodium chloride flush  3 mL Intravenous Q12H  . sodium hypochlorite   Irrigation BID  . sotalol  80 mg Oral BID   Continuous Infusions: . sodium chloride 10 mL/hr at 12/28/16 0700  . norepinephrine (LEVOPHED) Adult infusion Stopped (12/28/16 1030)   PRN Meds:.acetaminophen **OR** acetaminophen, ondansetron **OR** ondansetron (ZOFRAN) IV, oxyCODONE   Assessment: Active Problems:   Sepsis (HCC)   Alcoholic cirrhosis of liver with ascites (HCC)    Plan: This patient has been admitted with sepsis and some blood seen by his  girlfriend on the couch when he got up. The patient's bleeding is unlikely from a GI source. At this time no further GI intervention is warranted. The patient will need a colonoscopy when he is not septic and it may be planned as an outpatient since his last colonoscopy was 5 years ago with a tubular adenoma found at that time. Nothing further to add from a GI point of view. If a paracentesis is required because his ascites becomes symptomatic than I recommend interventional radiology for the paracentesis. I will sign off.  Please call if any further GI concerns or questions.  We would like to thank you for the opportunity to participate in the care of [name].     LOS: 1 day   [name] MiniumDarren Cait Locust 12/28/2016, 12:20 PM

## 2016-12-28 NOTE — Progress Notes (Signed)
56 yr old here with Encephalopathy, Sepsis and EtOH cirrhosis with sacral decub.  Patient confused and poor historian but will follow comands.  He states pain in the rectum area and that he thought he had an accident there.   Vitals:   12/28/16 1630 12/28/16 1700  BP: (!) 110/91 102/73  Pulse: (!) 129 (!) 101  Resp: (!) 22 20  Temp:     I/O last 3 completed shifts: In: 2078.3 [P.O.:540; I.V.:388.3; IV Piggyback:1150] Out: 1900 [Urine:1900] No intake/output data recorded.   PE:  Gen: NAD Res: Crackles in bases Cardio: tachycardic Abd: obese, soft, non tender Rectum: macerated skin tissue with a perirectal area just posterior to the rectum 2cm in size, dried blood but no active bleeding, 4cm sacral decub above this that appears it was already debrided clean tissue, no necrosis but maceration and some excoritation of skin   A/P:  Non infected sacral decubitus and perirectal wound with some maceration of skin, appears as if was already debrided but does not seem as if it was done here and patient poor historian.  Would continue dakin's to sacral area and would keep dry dressing in the perirectal wound and use nystatin powder to help dry the maceration and excoriation in the area. No need for surgical intervention.

## 2016-12-28 NOTE — Progress Notes (Signed)
Notified MD Ram that patient was refusing insulin until he spoke to an MD about the medication. Will continue to monitor patient.

## 2016-12-28 NOTE — Progress Notes (Signed)
MEDICATION RELATED CONSULT NOTE   Pharmacy Consult for Zosyn and Electrolyte Monitoring/Replacement Indication: sepsis possibly from sacral area  Assessment: 55yoM on home apixaban, who presents with sacral bleeding given KCETNRA. Pt found to be septic. Pharmacy consulted for Zosyn dosing.   Plan:  1. Continue Zosyn 4.5g IV Q8h due to patient weight of 143.7kg.   2. Magnesium sulfate 2 g iv once and will f/u AM labs.    Allergies  Allergen Reactions  . Clindamycin/Lincomycin Diarrhea    Patient Measurements: Height: 6\' 4"  (193 cm) Weight: (!) 320 lb 12.3 oz (145.5 kg) IBW/kg (Calculated) : 86.8  Vital Signs: Temp: 98.6 F (37 C) (01/16 0300) Temp Source: Oral (01/16 0300) BP: 93/65 (01/16 0700) Pulse Rate: 99 (01/16 0700) Intake/Output from previous day: 01/15 0701 - 01/16 0700 In: 1898 [P.O.:540; I.V.:358; IV Piggyback:1000] Out: 1300 [Urine:1300] Intake/Output from this shift: No intake/output data recorded.  Labs:  Recent Labs  12/26/16 2313 12/26/16 2351 12/27/16 1131 12/28/16 0555  WBC 27.6*  --  25.9* 24.4*  HGB 10.6*  --  8.3* 9.1*  HCT 31.2*  --  24.1* 26.6*  PLT 258  --  173 176  APTT 38*  --   --   --   CREATININE  --  1.71* 1.45* 1.47*  MG  --   --  1.1* 1.6*  PHOS  --   --   --  3.6  ALBUMIN  --  2.2*  --   --   PROT  --  7.2  --   --   AST  --  54*  --   --   ALT  --  27  --   --   ALKPHOS  --  153*  --   --   BILITOT  --  2.0*  --   --    Estimated Creatinine Clearance: 87.5 mL/min (by C-G formula based on SCr of 1.47 mg/dL (H)).   Microbiology: Recent Results (from the past 720 hour(s))  Body fluid culture     Status: None   Collection Time: 12/17/16  1:15 PM  Result Value Ref Range Status   Specimen Description PERITONEAL  Final   Special Requests NONE  Final   Gram Stain   Final    FEW WBC PRESENT,BOTH PMN AND MONONUCLEAR NO ORGANISMS SEEN    Culture   Final    No growth aerobically or anaerobically. Performed at [facility]    Report Status 12/20/2016 FINAL  Final  Culture, blood (routine x 2)     Status: None (Preliminary result)   Collection Time: 12/27/16 12:40 AM  Result Value Ref Range Status   Specimen Description BLOOD RIGHT HAND  Final   Special Requests   Final    BOTTLES DRAWN AEROBIC AND ANAEROBIC AERO5ML,ANAERO4ML   Culture NO GROWTH 1 DAY  Final   Report Status PENDING  Incomplete  Culture, blood (routine x 2)     Status: None (Preliminary result)   Collection Time: 12/27/16 12:40 AM  Result Value Ref Range Status   Specimen Description BLOOD LEFT HAND  Final   Special Requests   Final    BOTTLES DRAWN AEROBIC AND ANAEROBIC AERO3ML,ANAERO10ML   Culture NO GROWTH 1 DAY  Final   Report Status PENDING  Incomplete  MRSA PCR Screening     Status: None   Collection Time: 12/27/16  6:30 AM  Result Value Ref Range Status   MRSA by PCR NEGATIVE NEGATIVE Final  Comment:        The GeneXpert MRSA Assay (FDA approved for NASAL specimens only), is one component of a comprehensive MRSA colonization surveillance program. It is not intended to diagnose MRSA infection nor to guide or monitor treatment for MRSA infections.     Antibiotics: Cipro 1/15 >>1/15 Vancomycin 1/15 >>1/15 Zosyn 1/15 >>  Cultures: 1/15 BCx: NGTD 1/15 MRSA PCR: negative  1/15 UCx: sent    Delsa BernKelly m Jaisen Wiltrout, PharmD 12/28/2016,7:09 AM

## 2016-12-28 NOTE — Progress Notes (Addendum)
PULMONARY / CRITICAL CARE MEDICINE   Name: [name] MRN: [id number] DOB: August 31, 1961    ADMISSION DATE:  12/26/2016   SUBJECTIVE:  Pt more awake today, but still appears lethargic  REVIEW OF SYSTEMS:   Constitutional: Negative for fever and chills.  HENT: Negative for congestion and rhinorrhea.  Eyes: Negative for redness and visual disturbance.  Respiratory: Negative for shortness of breath and wheezing.  Cardiovascular: Negative for chest pain and palpitations.  Gastrointestinal: Negative  for nausea , vomiting and abdominal pain and     VITAL SIGNS: BP 102/73   Pulse (!) 101   Temp 98 F (36.7 C) (Oral)   Resp 20   Ht 6\' 4"  (1.93 m)   Wt (!) 320 lb 12.3 oz (145.5 kg)   SpO2 97%   BMI 39.05 kg/m   HEMODYNAMICS:    VENTILATOR SETTINGS:    INTAKE / OUTPUT: I/O last 3 completed shifts: In: 5386.3 [P.O.:540; I.V.:1596.3; IV Piggyback:3250] Out: 1300 [Urine:1300]  PHYSICAL EXAMINATION: General:  Chronically ill looking male, older for age, no distress Neuro: AAO X2, somnolent, follows commands HEENT:  [place]/AT, PERRLA, oral mucosa moist, +gag, trachea midline Cardiovascular: RRR, S1/S2, garde II-III systolic murmur Lungs:  Normal WOB, breath sounds diminished bilaterally, no wheezing Abdomen:  Distended, soft, non-tender, normal bowel sounds.  GU: Rectal exam, deferred since it was already performed in the ED. Extremities: severe venous stasis discoloration with ulceration bilaterally, +4 edema Musculoskeletal: +rom in BLU/BLE Skin:  Pale, sacral ulcer with dry occlusive dressing  LABS:  BMET  Recent Labs Lab 12/26/16 2351 12/27/16 1131 12/28/16 0555  NA 129* 129* 132*  K 5.5* 4.2 4.2  CL 93* 97* 100*  CO2 22 24 25   BUN 39* 37* 38*  CREATININE 1.71* 1.45* 1.47*  GLUCOSE 142* 175* 161*    Electrolytes  Recent Labs Lab 12/26/16 2351 12/27/16 1131 12/28/16 0555  CALCIUM 8.2* 7.7* 7.9*  MG  --  1.1* 1.6*  PHOS  --   --  3.6     CBC  Recent Labs Lab 12/26/16 2313 12/27/16 1131 12/28/16 0555  WBC 27.6* 25.9* 24.4*  HGB 10.6* 8.3* 9.1*  HCT 31.2* 24.1* 26.6*  PLT 258 173 176    Coag's  Recent Labs Lab 12/26/16 2313 12/27/16 1131 12/28/16 0555  APTT 38*  --   --   INR 2.29 1.92 1.73    Sepsis Markers  Recent Labs Lab 12/26/16 2351 12/27/16 0601 12/27/16 0649 12/28/16 0555  LATICACIDVEN  --  2.8* 2.9*  --   PROCALCITON 1.86  --   --  6.79    ABG No results for input(s): PHART, PCO2ART, PO2ART in the last 168 hours.  Liver Enzymes  Recent Labs Lab 12/26/16 2351  AST 54*  ALT 27  ALKPHOS 153*  BILITOT 2.0*  ALBUMIN 2.2*    Cardiac Enzymes No results for input(s): TROPONINI, PROBNP in the last 168 hours.  Glucose  Recent Labs Lab 12/27/16 0632 12/28/16 1208 12/28/16 1638  GLUCAP 174* 181* 173*    Imaging No results found.   STUDIES:  None  CULTURES: Blood cultures x 2 Wound culture  ANTIBIOTICS: Vancomycin 12/26/16>>1/16 Zosyn 12/26/16-- stop after 5 day course.   SIGNIFICANT EVENTS: 12/26/16: ED with bleeding peri-rectal/sacral pressure ulcers, hypotension and fever  LINES/TUBES: Right Subclavian TLC  DISCUSSION: 56 y/o WM with multiple comorbidities presenting with hypotension likely bleeding sacral ulcers while on anticoagulation.   ASSESSMENT / PLAN:  CARDIOVASCULAR A:  Hemorrhagic shock Chronic systolic Congestive heart  failure; Echo 12/18/16; EF=45% H/O Atrial fibrillation on anticoagulation Lymphedema PVD P:  Fluid resuscitation DC anticoagulation in light of bleeding decub ulcers, he is no longer a good candidate for anticoagulation.  Hold lotensin, and lasix 2/2 hypotension --wean off levophed.   RENAL A:   AKI on CKD-- now appears going back to baseline.  Chronic volume overload.  Hyperkalemia P:   IV fluids-KVO   GASTROINTESTINAL A:   Alcoholic liver cirrhosis Ascites P:   Paracentesis as needed  HEMATOLOGIC A:    Blood loss anemia-due to bleeding rectal decub-- now stable. P:  Monitor HG/HCT and transfuse if Hg<7  INFECTIOUS A:   Doubt Sepsis- hypotension was likely hemorrhagic shock.  P:   F/U culture-- negative thus far.  --DC abx after 5 day course.   Intergumentary A:   Necrotic /infected decubitus ulcers P:   Surgical  And wound care consults Daily dressings per wound care recommendations   -Wells Guileseep Jasan Doughtie, M.D.  12/28/2016  Critical Care Attestation.  I have personally obtained a history, examined the patient, evaluated laboratory and imaging results, formulated the assessment and plan and placed orders. The Patient requires high complexity decision making for assessment and support, frequent evaluation and titration of therapies, application of advanced monitoring technologies and extensive interpretation of multiple databases. The patient has critical illness that could lead imminently to failure of 1 or more organ systems and requires the highest level of physician preparedness to intervene.  Critical Care Time devoted to patient care services described in this note is 45 minutes and is exclusive of time spent in procedures.

## 2016-12-29 LAB — BLOOD CULTURE ID PANEL (REFLEXED)
ACINETOBACTER BAUMANNII: NOT DETECTED
CANDIDA ALBICANS: NOT DETECTED
CANDIDA GLABRATA: NOT DETECTED
CANDIDA KRUSEI: NOT DETECTED
CANDIDA PARAPSILOSIS: NOT DETECTED
Candida tropicalis: NOT DETECTED
ENTEROBACTER CLOACAE COMPLEX: NOT DETECTED
ENTEROBACTERIACEAE SPECIES: NOT DETECTED
ENTEROCOCCUS SPECIES: NOT DETECTED
ESCHERICHIA COLI: NOT DETECTED
HAEMOPHILUS INFLUENZAE: NOT DETECTED
KLEBSIELLA OXYTOCA: NOT DETECTED
KLEBSIELLA PNEUMONIAE: NOT DETECTED
LISTERIA MONOCYTOGENES: NOT DETECTED
Neisseria meningitidis: NOT DETECTED
Proteus species: NOT DETECTED
Pseudomonas aeruginosa: NOT DETECTED
STREPTOCOCCUS PYOGENES: NOT DETECTED
STREPTOCOCCUS SPECIES: NOT DETECTED
Serratia marcescens: NOT DETECTED
Staphylococcus aureus (BCID): NOT DETECTED
Staphylococcus species: NOT DETECTED
Streptococcus agalactiae: NOT DETECTED
Streptococcus pneumoniae: NOT DETECTED

## 2016-12-29 LAB — CBC
HEMATOCRIT: 25.3 % — AB (ref 40.0–52.0)
HEMOGLOBIN: 8.4 g/dL — AB (ref 13.0–18.0)
MCH: 33.9 pg (ref 26.0–34.0)
MCHC: 33.2 g/dL (ref 32.0–36.0)
MCV: 101.9 fL — AB (ref 80.0–100.0)
Platelets: 116 10*3/uL — ABNORMAL LOW (ref 150–440)
RBC: 2.48 MIL/uL — ABNORMAL LOW (ref 4.40–5.90)
RDW: 17.4 % — ABNORMAL HIGH (ref 11.5–14.5)
WBC: 18.7 10*3/uL — AB (ref 3.8–10.6)

## 2016-12-29 LAB — BASIC METABOLIC PANEL
Anion gap: 5 (ref 5–15)
BUN: 34 mg/dL — ABNORMAL HIGH (ref 6–20)
CHLORIDE: 99 mmol/L — AB (ref 101–111)
CO2: 27 mmol/L (ref 22–32)
Calcium: 8.2 mg/dL — ABNORMAL LOW (ref 8.9–10.3)
Creatinine, Ser: 1.33 mg/dL — ABNORMAL HIGH (ref 0.61–1.24)
GFR calc non Af Amer: 58 mL/min — ABNORMAL LOW (ref >60)
Glucose, Bld: 141 mg/dL — ABNORMAL HIGH (ref 65–99)
POTASSIUM: 4.1 mmol/L (ref 3.5–5.1)
SODIUM: 131 mmol/L — AB (ref 135–145)

## 2016-12-29 LAB — GLUCOSE, CAPILLARY
Glucose-Capillary: 131 mg/dL — ABNORMAL HIGH (ref 65–99)
Glucose-Capillary: 140 mg/dL — ABNORMAL HIGH (ref 65–99)
Glucose-Capillary: 146 mg/dL — ABNORMAL HIGH (ref 65–99)
Glucose-Capillary: 113 mg/dL — ABNORMAL HIGH (ref 65–99)

## 2016-12-29 LAB — PROCALCITONIN: Procalcitonin: 3.51 ng/mL

## 2016-12-29 LAB — MAGNESIUM: MAGNESIUM: 1.7 mg/dL (ref 1.7–2.4)

## 2016-12-29 MED ORDER — OXYCODONE HCL 5 MG PO TABS
5.0000 mg | ORAL_TABLET | Freq: Four times a day (QID) | ORAL | Status: DC | PRN
  Administered 2016-12-29 – 2017-01-12 (×6): 5 mg via ORAL
  Filled 2016-12-29 (×6): qty 1

## 2016-12-29 MED ORDER — PREMIER PROTEIN SHAKE
11.0000 [oz_av] | Freq: Two times a day (BID) | ORAL | Status: DC
  Administered 2016-12-30 – 2017-01-09 (×17): 11 [oz_av] via ORAL

## 2016-12-29 MED ORDER — MAGNESIUM SULFATE 2 GM/50ML IV SOLN
2.0000 g | Freq: Once | INTRAVENOUS | Status: AC
  Administered 2016-12-29: 2 g via INTRAVENOUS
  Filled 2016-12-29: qty 50

## 2016-12-29 NOTE — Progress Notes (Signed)
Initial Nutrition Assessment  DOCUMENTATION CODES:   Obesity unspecified  INTERVENTION:   Premier Protein TID- Each supplement provides 160kcal and 30g protein per serving   Double protein portions   NUTRITION DIAGNOSIS:   Increased nutrient needs related to wound healing as evidenced by increased estimated needs from protein.  GOAL:   Patient will meet greater than or equal to 90% of their needs  MONITOR:   PO intake, Supplement acceptance, Skin  REASON FOR ASSESSMENT:   Rounds    ASSESSMENT:   56 yr old here with Encephalopathy, Sepsis and EtOH cirrhosis with sacral decub.  Patient confused and poor historian but will follow comands.  He states pain in the rectum area and that he thought he had an accident there.    Met with pt in room today. Pt reports good appetite and oral intake pta. Pt currently eating 100% meals. Pt with multiple wounds. Per chart, pt is weight stable. Discussed with pt the importance of adequate protein intake for wound healing. Will order premier protein in setting of obesity.    Medications reviewed and include: ammonium lactate, colace, folic acid, lasix, insulin, zosyn, KCl, oxycodone   Labs reviewed: Na 131(L), Cl 99(L), BUN 34(H), creat 1.33(H), Ca 8.2(L), Phos 3.6 wnl, Mg 1.7 wnl Wbc- 18.7(H), Hgb 8.4(L), Hct 25.3(L) cbgs- 142, 175, 161, 141   Nutrition-Focused physical exam completed. Findings are no fat depletion, no muscle depletion, and no edema.   Diet Order:  Diet Carb Modified Fluid consistency: Thin; Room service appropriate? Yes  Skin:    Unstageable pressure injuries buttocks and coccyx  Last BM:  1/16  Height:   Ht Readings from Last 1 Encounters:  12/27/16 6' 4"  (1.93 m)    Weight:   Wt Readings from Last 1 Encounters:  12/28/16 (!) 320 lb 12.3 oz (145.5 kg)    Ideal Body Weight:     BMI:  Body mass index is 39.05 kg/m.  Estimated Nutritional Needs:   Kcal:  2700-2900kcal/day   Protein:  145-160g/day    Fluid:  >2.7L/day   EDUCATION NEEDS:   Education needs no appropriate at this time  Koleen Distance, RD, [name] Pager #[phone]

## 2016-12-29 NOTE — Progress Notes (Signed)
PHARMACY - PHYSICIAN COMMUNICATION CRITICAL VALUE ALERT - BLOOD CULTURE IDENTIFICATION (BCID)  Results for orders placed or performed during the hospital encounter of 12/26/16  Blood Culture ID Panel (Reflexed) (Collected: 12/27/2016 12:40 AM)  Result Value Ref Range   Enterococcus species NOT DETECTED NOT DETECTED   Listeria monocytogenes NOT DETECTED NOT DETECTED   Staphylococcus species NOT DETECTED NOT DETECTED   Staphylococcus aureus NOT DETECTED NOT DETECTED   Streptococcus species NOT DETECTED NOT DETECTED   Streptococcus agalactiae NOT DETECTED NOT DETECTED   Streptococcus pneumoniae NOT DETECTED NOT DETECTED   Streptococcus pyogenes NOT DETECTED NOT DETECTED   Acinetobacter baumannii NOT DETECTED NOT DETECTED   Enterobacteriaceae species NOT DETECTED NOT DETECTED   Enterobacter cloacae complex NOT DETECTED NOT DETECTED   Escherichia coli NOT DETECTED NOT DETECTED   Klebsiella oxytoca NOT DETECTED NOT DETECTED   Klebsiella pneumoniae NOT DETECTED NOT DETECTED   Proteus species NOT DETECTED NOT DETECTED   Serratia marcescens NOT DETECTED NOT DETECTED   Haemophilus influenzae NOT DETECTED NOT DETECTED   Neisseria meningitidis NOT DETECTED NOT DETECTED   Pseudomonas aeruginosa NOT DETECTED NOT DETECTED   Candida albicans NOT DETECTED NOT DETECTED   Candida glabrata NOT DETECTED NOT DETECTED   Candida krusei NOT DETECTED NOT DETECTED   Candida parapsilosis NOT DETECTED NOT DETECTED   Candida tropicalis NOT DETECTED NOT DETECTED    Name of physician (or Provider) Contacted: Dr. Imogene Burnhen  Changes to prescribed antibiotics required:  No changes in therapy warranted at this time. Will continue to monitor  Horris LatinoHolly Muriah Harsha, PharmD Pharmacy Resident 12/29/2016 9:13 AM

## 2016-12-29 NOTE — Progress Notes (Signed)
MEDICATION RELATED CONSULT NOTE  Pharmacy Consult for piperacillin/tazobactam and electrolyte monitoring/replacement Indication: sepsis possibly from sacral area  Assessment: 55yoM on home apixaban, who presents with sacral bleeding given KCETNRA. Pt found to be septic. Pharmacy consulted for piperacillin/tazobactam dosing.   Plan:  1. Continue piperacillin/tazobactam 4.5g IV q8h due to patient weight of 145.5 kg  2. 1/17 am mag = 1.7 Give magnesium sulfate 2 g IV x1 and f/u AM labs   Allergies  Allergen Reactions  . Clindamycin/Lincomycin Diarrhea    Patient Measurements: Height: 6\' 4"  (193 cm) Weight: (!) 320 lb 12.3 oz (145.5 kg) IBW/kg (Calculated) : 86.8  Vital Signs: Temp: 97.5 F (36.4 C) (01/17 0510) Temp Source: Oral (01/17 0510) BP: 104/69 (01/17 0510) Pulse Rate: 85 (01/17 0510) Intake/Output from previous day: 01/16 0701 - 01/17 0700 In: 455.5 [I.V.:215.5; IV Piggyback:240] Out: 1425 [Urine:1425] Intake/Output from this shift: No intake/output data recorded.  Labs:  Recent Labs  12/26/16 2313  12/26/16 2351 12/27/16 1131 12/28/16 0555 12/29/16 0532  WBC 27.6*  --   --  25.9* 24.4* 18.7*  HGB 10.6*  --   --  8.3* 9.1* 8.4*  HCT 31.2*  --   --  24.1* 26.6* 25.3*  PLT 258  --   --  173 176 116*  APTT 38*  --   --   --   --   --   CREATININE  --   < > 1.71* 1.45* 1.47* 1.33*  MG  --   --   --  1.1* 1.6* 1.7  PHOS  --   --   --   --  3.6  --   ALBUMIN  --   --  2.2*  --   --   --   PROT  --   --  7.2  --   --   --   AST  --   --  54*  --   --   --   ALT  --   --  27  --   --   --   ALKPHOS  --   --  153*  --   --   --   BILITOT  --   --  2.0*  --   --   --   < > = values in this interval not displayed. Estimated Creatinine Clearance: 96.8 mL/min (by C-G formula based on SCr of 1.33 mg/dL (H)).   Microbiology: Recent Results (from the past 720 hour(s))  Body fluid culture     Status: None   Collection Time: 12/17/16  1:15 PM  Result Value Ref  Range Status   Specimen Description PERITONEAL  Final   Special Requests NONE  Final   Gram Stain   Final    FEW WBC PRESENT,BOTH PMN AND MONONUCLEAR NO ORGANISMS SEEN    Culture   Final    No growth aerobically or anaerobically. Performed at [facility]     Report Status 12/20/2016 FINAL  Final  Culture, blood (routine x 2)     Status: None (Preliminary result)   Collection Time: 12/27/16 12:40 AM  Result Value Ref Range Status   Specimen Description BLOOD RIGHT HAND  Final   Special Requests   Final    BOTTLES DRAWN AEROBIC AND ANAEROBIC AERO5ML,ANAERO4ML   Culture NO GROWTH 2 DAYS  Final   Report Status PENDING  Incomplete  Culture, blood (routine x 2)     Status: None (Preliminary result)   Collection Time:  12/27/16 12:40 AM  Result Value Ref Range Status   Specimen Description BLOOD LEFT HAND  Final   Special Requests   Final    BOTTLES DRAWN AEROBIC AND ANAEROBIC AERO3ML,ANAERO10ML   Culture  Setup Time   Final    Organism ID to follow GRAM POSITIVE RODS ANAEROBIC BOTTLE ONLY CRITICAL RESULT CALLED TO, READ BACK BY AND VERIFIED WITH: HANK ZOMPA 12/29/16 0900 SGD    Culture GRAM POSITIVE RODS  Final   Report Status PENDING  Incomplete  Blood Culture ID Panel (Reflexed)     Status: None   Collection Time: 12/27/16 12:40 AM  Result Value Ref Range Status   Enterococcus species NOT DETECTED NOT DETECTED Final   Listeria monocytogenes NOT DETECTED NOT DETECTED Final   Staphylococcus species NOT DETECTED NOT DETECTED Final   Staphylococcus aureus NOT DETECTED NOT DETECTED Final   Streptococcus species NOT DETECTED NOT DETECTED Final   Streptococcus agalactiae NOT DETECTED NOT DETECTED Final   Streptococcus pneumoniae NOT DETECTED NOT DETECTED Final   Streptococcus pyogenes NOT DETECTED NOT DETECTED Final   Acinetobacter baumannii NOT DETECTED NOT DETECTED Final   Enterobacteriaceae species NOT DETECTED NOT DETECTED Final   Enterobacter cloacae complex NOT DETECTED  NOT DETECTED Final   Escherichia coli NOT DETECTED NOT DETECTED Final   Klebsiella oxytoca NOT DETECTED NOT DETECTED Final   Klebsiella pneumoniae NOT DETECTED NOT DETECTED Final   Proteus species NOT DETECTED NOT DETECTED Final   Serratia marcescens NOT DETECTED NOT DETECTED Final   Haemophilus influenzae NOT DETECTED NOT DETECTED Final   Neisseria meningitidis NOT DETECTED NOT DETECTED Final   Pseudomonas aeruginosa NOT DETECTED NOT DETECTED Final   Candida albicans NOT DETECTED NOT DETECTED Final   Candida glabrata NOT DETECTED NOT DETECTED Final   Candida krusei NOT DETECTED NOT DETECTED Final   Candida parapsilosis NOT DETECTED NOT DETECTED Final   Candida tropicalis NOT DETECTED NOT DETECTED Final  MRSA PCR Screening     Status: None   Collection Time: 12/27/16  6:30 AM  Result Value Ref Range Status   MRSA by PCR NEGATIVE NEGATIVE Final    Comment:        The GeneXpert MRSA Assay (FDA approved for NASAL specimens only), is one component of a comprehensive MRSA colonization surveillance program. It is not intended to diagnose MRSA infection nor to guide or monitor treatment for MRSA infections.   Urine culture     Status: None   Collection Time: 12/27/16 11:32 AM  Result Value Ref Range Status   Specimen Description URINE, CLEAN CATCH  Final   Special Requests NONE  Final   Culture   Final    NO GROWTH Performed at [facility], [address], [place], [place] [address]    Report Status 12/28/2016 FINAL  Final    Antibiotics: Cipro 1/15 >>1/15 Vancomycin 1/15 >>1/15 Piperacillin/tazo 1/15 >>  Cultures: 1/15 BCx: 1/4 bottles GPR 1/15 BCx: NGTD 1/15 MRSA PCR: negative  1/15 UCx: NG  Horris Latino, PharmD Pharmacy Resident 12/29/2016 9:53 AM

## 2016-12-29 NOTE — Progress Notes (Signed)
PT Cancellation Note  Patient Details Name: [name] MRN: [id number] DOB: 04/28/1961   Cancelled Treatment:    Reason Eval/Treat Not Completed: Other (comment) (Consult received and chart reviewed.  Patient currently lethargic and unable to meaningfully participate with PT evaluation at this time. Will hold session and re-attempt at later time/date as patient medically appropriate.)   Shemar Plemmons H. Manson PasseyBrown, PT, DPT, NCS 12/29/16, 9:47 AM [phone]

## 2016-12-29 NOTE — Progress Notes (Signed)
[facility] [place] [facility]   PATIENT NAME: [name]    MR#:  [id number]  DATE OF BIRTH:  1961/03/26  SUBJECTIVE:  CHIEF COMPLAINT:   Chief Complaint  Patient presents with  . Skin Ulcer    Sacraum bleeding     REVIEW OF SYSTEMS:  Review of Systems  Constitutional: Positive for malaise/fatigue. Negative for chills and fever.  HENT: Negative for congestion.   Eyes: Negative for blurred vision and double vision.  Respiratory: Negative for cough, shortness of breath, wheezing and stridor.   Cardiovascular: Negative for chest pain and leg swelling.  Gastrointestinal: Negative for abdominal pain, blood in stool, diarrhea, melena, nausea and vomiting.  Genitourinary: Negative for dysuria, frequency and hematuria.  Musculoskeletal: Positive for back pain. Negative for joint pain.  Skin: Negative for itching and rash.  Neurological: Positive for weakness. Negative for dizziness, focal weakness and loss of consciousness.  Psychiatric/Behavioral: Negative for depression. The patient is not nervous/anxious.     DRUG ALLERGIES:   Allergies  Allergen Reactions  . Clindamycin/Lincomycin Diarrhea   VITALS:  Blood pressure 103/75, pulse 89, temperature 97.3 F (36.3 C), temperature source Oral, resp. rate 17, height 6\' 4"  (1.93 m), weight (!) 320 lb 12.3 oz (145.5 kg), SpO2 100 %. PHYSICAL EXAMINATION:  Physical Exam  Constitutional: He is oriented to person, place, and time. No distress.  Morbid obese.  HENT:  Head: Normocephalic.  Mouth/Throat: Oropharynx is clear and moist.  Eyes: Conjunctivae and EOM are normal.  Neck: Normal range of motion. Neck supple. No JVD present. No tracheal deviation present.  Cardiovascular: Normal rate, regular rhythm and normal heart sounds.   No murmur heard. Pulmonary/Chest: Effort normal and breath sounds normal. No respiratory distress. He has no wheezes. He has no rales.  Abdominal: Soft. Bowel sounds are  normal. He exhibits no distension. There is no tenderness.  Musculoskeletal: Normal range of motion. He exhibits no edema or tenderness.  Neurological: He is alert and oriented to person, place, and time. No cranial nerve deficit.  Skin: No rash noted. No erythema.  sacral ulcer with dry occlusive dressing  Psychiatric: Affect normal.   LABORATORY PANEL:   CBC  Recent Labs Lab 12/29/16 0532  WBC 18.7*  HGB 8.4*  HCT 25.3*  PLT 116*   ------------------------------------------------------------------------------------------------------------------ Chemistries   Recent Labs Lab 12/26/16 2351  12/29/16 0532  NA 129*  < > 131*  K 5.5*  < > 4.1  CL 93*  < > 99*  CO2 22  < > 27  GLUCOSE 142*  < > 141*  BUN 39*  < > 34*  CREATININE 1.71*  < > 1.33*  CALCIUM 8.2*  < > 8.2*  MG  --   < > 1.7  AST 54*  --   --   ALT 27  --   --   ALKPHOS 153*  --   --   BILITOT 2.0*  --   --   < > = values in this interval not displayed. RADIOLOGY:  No results found. ASSESSMENT AND PLAN:  56 y/o WM with multiple comorbidities presenting with hypotension likely bleeding sacral ulcers while on anticoagulation.   Hemorrhagic shock, due to bleeding sacral ulcers while on anticoagulation. Improved with fluid resuscitation. Discontinue Lotensin and Lasix due to hypotension. He was weaned off Levophed.  Blood loss anemia-due to bleeding rectal decub-- now stable.  AKI on CKD-- improving to baseline with IV fluid support.  Hyperkalemia. Improved.  Hyponatremia. Sodium  level is still low.  Chronic diastolic Congestive heart failure; Echo 12/18/16; EF=45%. Stable. Continue Lasix. Chronic Atrial fibrillation on anticoagulation, discontinued anticoagulation (Eliquis) in light of bleeding decub ulcers, he is no longer a good candidate for anticoagulation per Dr. Linward Natalamachandra. Continue sotalol.  Alcoholic liver cirrhosis Ascites, Paracentesis as needed.   Sacral DU. Initially suspected sepsis due to  sacral DU infection. But according to surgeon, no infection, Daily dressings per wound care recommendations. DC abx after 5 day course per Dr. Linward Natalamachandra.  Leukocytosis and elevated procalcitonin. Improving but WBC is still high at 18,7000. follow-up CBC  All the records are reviewed and case discussed with Care Management/Social Worker. Management plans discussed with the patient, family and they are in agreement.  CODE STATUS: Full code  TOTAL TIME TAKING CARE OF THIS PATIENT: 46 minutes.   More than 50% of the time was spent in counseling/coordination of care: YES  POSSIBLE D/C IN 2 DAYS, DEPENDING ON CLINICAL CONDITION.   Shaune Pollackhen, Danely Bayliss M.D on 12/29/2016 at 4:34 PM  Between 7am to 6pm - Pager - [phone]  After 6pm go to [email or web address] - Social research officer, governmentpassword [email or web address] [email or web address]  [facility]  Office  [phone]  CC: Primary care physician; Corky DownsMASOUD,[name], MD  Note: This dictation was prepared with Dragon dictation along with smaller phrase technology. Any transcriptional errors that result from this process are unintentional.

## 2016-12-29 NOTE — Progress Notes (Signed)
OT Cancellation Note  Patient Details Name: [name] MRN: [id number] DOB: 07-24-1961   Cancelled Treatment:    Reason Eval/Treat Not Completed: Other (comment) (Per PT, pt lethargic and cannot meanfully participate in OT session at this time. Will hold OT and re-attempt at later date/time as appropriate.)  Eliezer BottomJamie L Stiller, OTR/L 12/29/2016, 11:53 AM

## 2016-12-29 NOTE — Progress Notes (Signed)
56 yr old here with Encephalopathy, Sepsis and EtOH cirrhosis with sacral decub.  Patient confused and poor historian.   Vitals:   12/29/16 1013 12/29/16 1355  BP: 113/84 103/75  Pulse: 92 89  Resp:    Temp:  97.3 F (36.3 C)   I/O last 3 completed shifts: In: 851.2 [I.V.:411.2; IV Piggyback:440] Out: 2050 [Urine:2050] Total I/O In: 560.6 [P.O.:354; I.V.:163.6; IV Piggyback:43] Out: 450 [Urine:450]   PE:  Gen: NAD Res: Crackles in bases Cardio: tachycardic Abd: obese, soft, non tender Rectum: macerated skin tissue with a perirectal area just posterior to the rectum 2cm in size, dried blood but no active bleeding, 4cm sacral decub above this that appears it was already debrided clean tissue, no necrosis but maceration and some excoritation of skin   A/P:  Non infected sacral decubitus and perirectal wound with some maceration of skin, appears as if was already debrided but does not seem as if it was done here and patient poor historian.  Would continue dakin's to sacral area and would keep dry dressing in the perirectal wound and use nystatin powder to help dry the maceration and excoriation in the area. No need for surgical intervention.

## 2016-12-29 NOTE — Clinical Social Work Note (Signed)
Clinical Social Work Assessment  Patient Details  Name: [name] MRN: [id number] Date of Birth: 09-08-1961  Date of referral:  12/29/16               Reason for consult:  Abuse/Neglect                Permission sought to share information with:    Permission granted to share information::     Name::        Agency::     Relationship::     Contact Information:     Housing/Transportation Living arrangements for the past 2 months:  Single Family Home Source of Information:  Patient Patient Interpreter Needed:  None Criminal Activity/Legal Involvement Pertinent to Current Situation/Hospitalization:  No - Comment as needed Significant Relationships:  Spouse Lives with:  Significant Other Do you feel safe going back to the place where you live?  Yes Need for family participation in patient care:  Yes (Comment)  Care giving concerns:  Patient resides at home with his girlfriend. Patient does not ambulate very much.   Social Worker assessment / plan:  CSW met with patient this morning and he was alert and oriented X4. CSW introduced self and explained purpose of visit. Patient was eating pancakes and trying to call his girlfriend at time of CSW visit. Patient was willing to answer questions but was quick with his answers. Patient states that he is able to ambulate short distances at baseline. Patient stated that he has not taken a drink in a "long time" and stated that it has been so long ago he does not remember. Patient reports that he is taken care of very well by his girlfriend. When asked if he would consider going to rehab at discharge for PT, patient immediately said "no." Patient stated he was returning home. Patient has [facility] following in the home and RN CM is checking with [facility] to see if they have any concerns about patient's care.  Employment status:  Disabled (Comment on whether or not currently receiving Disability) Insurance information:  Managed  Care PT Recommendations:  Not assessed at this time Information / Referral to community resources:     Patient/Family's Response to care:  Patient was not very pleasant but was willing to answer questions.   Patient/Family's Understanding of and Emotional Response to Diagnosis, Current Treatment, and Prognosis:  Patient's physical ability is limited and it is unclear if patient has any insight into this at this time.   Emotional Assessment Appearance:  Appears older than stated age Attitude/Demeanor/Rapport:   (cooperative) Affect (typically observed):  Irritable Orientation:  Oriented to Self, Oriented to Place, Oriented to  Time, Oriented to Situation Alcohol / Substance use:  Alcohol Use Psych involvement (Current and /or in the community):  No (Comment)  Discharge Needs  Concerns to be addressed:  Care Coordination Readmission within the last 30 days:  No Current discharge risk:  None Barriers to Discharge:  No Barriers Identified   Shela Leff, LCSW 12/29/2016, 11:09 AM

## 2016-12-29 NOTE — Progress Notes (Addendum)
Pt verbalizes to RN that he refuses to be rotated to lay on his sides, states that "it hurts to bad to lay on my sides " RN has encourage pt to rotate from laying on his back. Pt was placed on sizewise bed at 2200 12/28/2016.  Baylynn Shifflett [place]

## 2016-12-29 NOTE — Care Management Note (Signed)
Case Management Note  Patient Details  Name: [name] MRN: [id number] Date of Birth: Sep 26, 1961  Subjective/Objective:  Met with patient at bedside. Admitted with Encephalopathy, Sepsis, ETOH cirrhosis and bleeding sacral decub. No surgical intervention at this time to wound.   He is lethargic. Oriented to person and situation only. Mumbles when spoken to. He lives at home with his girlfriend. Unable to establish what is baseline ADL function is. He is open to [facility] for RN, PT, OT.  Will need resumption of care orders.                  Action/Plan: Following progression.   Expected Discharge Date:                  Expected Discharge Plan:  [place]  In-House Referral:     Discharge planning Services  CM Consult  Post Acute Care Choice:  Home Health, Resumption of Svcs/PTA Provider Choice offered to:     DME Arranged:    DME Agency:     HH Arranged:  RN, PT, OT [place] Agency:  [place]  Status of Service:  In process, will continue to follow  If discussed at Long Length of Stay Meetings, dates discussed:    Additional Comments:  Jolly Mango, RN 12/29/2016, 9:55 AM

## 2016-12-30 DIAGNOSIS — F4323 Adjustment disorder with mixed anxiety and depressed mood: Secondary | ICD-10-CM

## 2016-12-30 LAB — BASIC METABOLIC PANEL
ANION GAP: 5 (ref 5–15)
BUN: 32 mg/dL — ABNORMAL HIGH (ref 6–20)
CALCIUM: 7.8 mg/dL — AB (ref 8.9–10.3)
CO2: 27 mmol/L (ref 22–32)
Chloride: 99 mmol/L — ABNORMAL LOW (ref 101–111)
Creatinine, Ser: 1.29 mg/dL — ABNORMAL HIGH (ref 0.61–1.24)
GLUCOSE: 159 mg/dL — AB (ref 65–99)
POTASSIUM: 3.8 mmol/L (ref 3.5–5.1)
Sodium: 131 mmol/L — ABNORMAL LOW (ref 135–145)

## 2016-12-30 LAB — CBC
HEMATOCRIT: 25.5 % — AB (ref 40.0–52.0)
Hemoglobin: 8.8 g/dL — ABNORMAL LOW (ref 13.0–18.0)
MCH: 35 pg — ABNORMAL HIGH (ref 26.0–34.0)
MCHC: 34.5 g/dL (ref 32.0–36.0)
MCV: 101.3 fL — AB (ref 80.0–100.0)
PLATELETS: 108 10*3/uL — AB (ref 150–440)
RBC: 2.52 MIL/uL — ABNORMAL LOW (ref 4.40–5.90)
RDW: 17.3 % — AB (ref 11.5–14.5)
WBC: 17.2 10*3/uL — AB (ref 3.8–10.6)

## 2016-12-30 LAB — GLUCOSE, CAPILLARY
Glucose-Capillary: 134 mg/dL — ABNORMAL HIGH (ref 65–99)
Glucose-Capillary: 173 mg/dL — ABNORMAL HIGH (ref 65–99)
Glucose-Capillary: 173 mg/dL — ABNORMAL HIGH (ref 65–99)
Glucose-Capillary: 137 mg/dL — ABNORMAL HIGH (ref 65–99)

## 2016-12-30 LAB — MAGNESIUM: Magnesium: 1.8 mg/dL (ref 1.7–2.4)

## 2016-12-30 LAB — AMMONIA: Ammonia: 44 umol/L — ABNORMAL HIGH (ref 9–35)

## 2016-12-30 MED ORDER — POTASSIUM CHLORIDE CRYS ER 20 MEQ PO TBCR
20.0000 meq | EXTENDED_RELEASE_TABLET | Freq: Every day | ORAL | Status: DC
  Administered 2016-12-31 – 2017-01-14 (×15): 20 meq via ORAL
  Filled 2016-12-30 (×15): qty 1

## 2016-12-30 MED ORDER — MAGNESIUM SULFATE 2 GM/50ML IV SOLN
2.0000 g | Freq: Once | INTRAVENOUS | Status: AC
  Administered 2016-12-30: 2 g via INTRAVENOUS
  Filled 2016-12-30: qty 50

## 2016-12-30 MED ORDER — FUROSEMIDE 10 MG/ML IJ SOLN
20.0000 mg | Freq: Once | INTRAMUSCULAR | Status: AC
  Administered 2016-12-30: 20 mg via INTRAVENOUS
  Filled 2016-12-30: qty 2

## 2016-12-30 MED ORDER — DAKINS (1/4 STRENGTH) 0.125 % EX SOLN
Freq: Two times a day (BID) | CUTANEOUS | Status: AC
  Administered 2016-12-30 – 2017-01-02 (×6)
  Filled 2016-12-30 (×2): qty 473

## 2016-12-30 MED ORDER — POTASSIUM CHLORIDE CRYS ER 20 MEQ PO TBCR
20.0000 meq | EXTENDED_RELEASE_TABLET | Freq: Once | ORAL | Status: AC
  Administered 2016-12-30: 20 meq via ORAL
  Filled 2016-12-30: qty 1

## 2016-12-30 NOTE — Evaluation (Signed)
Occupational Therapy Evaluation Patient Details Name: [name] MRN: [id number] DOB: Jul 07, 1961 Today's Date: 12/30/2016    History of Present Illness presented to ER secondary to rectal bleeding; admitted with septic shock secondary to necrotic sacral ulcer.   Clinical Impression   Pt very lethargic during OT session, declined any out of bed or bed mobility activity due to fatigue. Pt presents with weakness, decreased activity tolerance, poor insight/safety awareness, and with increase need for assistance for ADL who would benefit from skilled OT services to address noted impairments and functional deficits in order to return to PLOF and minimize risk of further decline, falls, and rehospitalization.    Follow Up Recommendations  Home health OT (pt and girlfriend declining SNF for STR, agreeable to HHOT/PT)    Equipment Recommendations  Tub/shower seat    Recommendations for Other Services       Precautions / Restrictions Precautions Precautions: Fall Restrictions Weight Bearing Restrictions: No      Mobility Bed Mobility Overal bed mobility: Needs Assistance Bed Mobility: Rolling;Supine to Sit;Sit to Supine Rolling: Max assist;+2 for physical assistance   Supine to sit: Total assist;+2 for physical assistance Sit to supine: Total assist;+2 for physical assistance   General bed mobility comments: did not assess due to fatigue/safety, defer to previous PT session  Transfers                 General transfer comment: did not assess due to fatigue/safety    Balance                                  ADL Overall ADL's : Needs assistance/impaired Eating/Feeding: Bed level;Set up   Grooming: Set up;Bed level   Upper Body Bathing: Moderate assistance;Bed level   Lower Body Bathing: Maximal assistance;Bed level   Upper Body Dressing : Minimal assistance;Bed level   Lower Body Dressing: Maximal assistance;Bed level     Toilet Transfer  Details (indicate cue type and reason): did not assess due to fatigue/safety       Tub/Shower Transfer Details (indicate cue type and reason): did not assess due to fatigue/safety         Vision Vision Assessment?: No apparent visual deficits   Perception     Praxis      Pertinent Vitals/Pain Pain Assessment: No/denies pain  Pain Intervention(s): Monitored during session     Hand Dominance     Extremity/Trunk Assessment Upper Extremity Assessment Upper Extremity Assessment: Generalized weakness (grossly 3+/5, ROM WFL)   Lower Extremity Assessment Lower Extremity Assessment: Generalized weakness   Cervical / Trunk Assessment Cervical / Trunk Assessment: Kyphotic   Communication Communication Communication: Other (comment) (mumbled, slow to respond)   Cognition Arousal/Alertness: Lethargic Behavior During Therapy: WFL for tasks assessed/performed Overall Cognitive Status: Difficult to assess                 General Comments: delayed processing; poor insight/awarenses; slightly irritable at times   General Comments       Exercises   Other Exercises Other Exercises: Rolling bilat, max assist +2; hand-over-hand for UE support. Dep assist for hygiene.   Shoulder Instructions      Home Living Family/patient expects to be discharged to:: Private residence Living Arrangements: Spouse/significant other (lives with girlfriend) Available Help at Discharge: Family;Available 24 hours/day (girlfriend) Type of Home: Apartment       Home Layout: Two level;1/2 bath on main level Alternate Level  Stairs-Number of Steps: full flight   Bathroom Shower/Tub: Walk-in shower Shower/tub characteristics: Engineer, building servicesCurtain Bathroom Toilet: Standard (with toilet riser attached)     Home Equipment: Bedside commode;Toilet riser   Additional Comments: Has been sleeping in recliner/lift chair in recent weeks      Prior Functioning/Environment Level of Independence: Needs  assistance        Comments: Limited household mobility; assist from girlfriend for ADLs (mod assist for LB bathing, dressing, don/doffing compression stockings, max assist for wound care), mobility as needed. Was driving prior to the past few weeks        OT Problem List: Decreased strength;Decreased safety awareness;Decreased activity tolerance;Decreased knowledge of use of DME or AE   OT Treatment/Interventions: Self-care/ADL training;Therapeutic exercise;Energy conservation;Patient/family education;DME and/or AE instruction    OT Goals(Current goals can be found in the care plan section) Acute Rehab OT Goals Patient Stated Goal: return home OT Goal Formulation: With patient/family Time For Goal Achievement: 01/13/17 Potential to Achieve Goals: Good  OT Frequency: Min 1X/week   Barriers to D/C:            Co-evaluation              End of Session    Activity Tolerance: Patient limited by fatigue Patient left: in bed;with call bell/phone within reach;with bed alarm set;with family/visitor present   Time: 1425-1445 OT Time Calculation (min): 20 min Charges:  OT General Charges $OT Visit: 1 Procedure OT Evaluation $OT Eval Moderate Complexity: 1 Procedure G-Codes:    Eliezer BottomJamie L Stiller, OTR/L 12/30/2016, 2:57 PM

## 2016-12-30 NOTE — Progress Notes (Addendum)
[facility] - [place] at [facility]   PATIENT NAME: [name] BrasMichael [name]    MR#:  [id number]  DATE OF BIRTH:  11-27-1961  SUBJECTIVE:  CHIEF COMPLAINT:   Chief Complaint  Patient presents with  . Skin Ulcer    Sacraum bleeding    Weakness and back pain. REVIEW OF SYSTEMS:  Review of Systems  Constitutional: Positive for malaise/fatigue. Negative for chills and fever.  HENT: Negative for congestion.   Eyes: Negative for blurred vision and double vision.  Respiratory: Negative for cough, shortness of breath, wheezing and stridor.   Cardiovascular: Negative for chest pain and leg swelling.  Gastrointestinal: Negative for abdominal pain, blood in stool, diarrhea, melena, nausea and vomiting.  Genitourinary: Negative for dysuria, frequency and hematuria.  Musculoskeletal: Positive for back pain. Negative for joint pain.  Skin: Negative for itching and rash.  Neurological: Positive for weakness. Negative for dizziness, focal weakness and loss of consciousness.  Psychiatric/Behavioral: Negative for depression. The patient is not nervous/anxious.     DRUG ALLERGIES:   Allergies  Allergen Reactions  . Clindamycin/Lincomycin Diarrhea   VITALS:  Blood pressure 110/70, pulse 93, temperature 97.7 F (36.5 C), resp. rate 18, height 6\' 4"  (1.93 m), weight (!) 322 lb (146.1 kg), SpO2 100 %. PHYSICAL EXAMINATION:  Physical Exam  Constitutional: He is oriented to person, place, and time. No distress.  Morbid obese.  HENT:  Head: Normocephalic.  Mouth/Throat: Oropharynx is clear and moist.  Eyes: Conjunctivae and EOM are normal.  Neck: Normal range of motion. Neck supple. No JVD present. No tracheal deviation present.  Cardiovascular: Normal rate, regular rhythm and normal heart sounds.   No murmur heard. Pulmonary/Chest: Effort normal and breath sounds normal. No respiratory distress. He has no wheezes. He has no rales.  Abdominal: Soft. Bowel sounds are normal. He  exhibits no distension. There is no tenderness.  Genitourinary:  Genitourinary Comments: Penis edema.  Musculoskeletal: Normal range of motion. He exhibits edema. He exhibits no tenderness.  Neurological: He is alert and oriented to person, place, and time. No cranial nerve deficit.  Skin: No rash noted. No erythema.  sacral ulcer with dry occlusive dressing  Psychiatric: Affect normal.   LABORATORY PANEL:   CBC  Recent Labs Lab 12/30/16 0456  WBC 17.2*  HGB 8.8*  HCT 25.5*  PLT 108*   ------------------------------------------------------------------------------------------------------------------ Chemistries   Recent Labs Lab 12/26/16 2351  12/30/16 0456  NA 129*  < > 131*  K 5.5*  < > 3.8  CL 93*  < > 99*  CO2 22  < > 27  GLUCOSE 142*  < > 159*  BUN 39*  < > 32*  CREATININE 1.71*  < > 1.29*  CALCIUM 8.2*  < > 7.8*  MG  --   < > 1.8  AST 54*  --   --   ALT 27  --   --   ALKPHOS 153*  --   --   BILITOT 2.0*  --   --   < > = values in this interval not displayed. RADIOLOGY:  No results found. ASSESSMENT AND PLAN:  56 y/o WM with multiple comorbidities presenting with hypotension likely bleeding sacral ulcers while on anticoagulation.   Hemorrhagic shock, due to bleeding sacral ulcers while on anticoagulation. Improved with fluid resuscitation. Discontinue Lotensin and Lasix due to hypotension. He was weaned off Levophed.  Blood loss anemia-due to bleeding rectal decub-- now stable.  AKI on CKD 2-- improved to baseline with IV fluid  support.  Hyperkalemia. Improved.  Hyponatremia. Sodium level is still low.  Chronic diastolic Congestive heart failure; Echo 12/18/16; EF=45%. Stable. Continue Lasix. Chronic Atrial fibrillation on anticoagulation, discontinued anticoagulation (Eliquis) in light of bleeding decub ulcers, he is no longer a good candidate for anticoagulation per Dr. Linward Natal. Continue sotalol.  Alcoholic liver cirrhosis Ascites, s/p Paracentesis  6.2 L.  Sacral DU. Initially suspected sepsis due to sacral DU infection. But according to surgeon, no infection, Daily dressings per wound care recommendations. DC abx (on zosyn) after 5 day course per Dr. Linward Natal.  Leukocytosis and elevated procalcitonin. Improving,  WBC is down to 17,2000. follow-up CBC  Per PT evaluation, the patient and need skilled nursing facility, but the patient and his girlfriend declined, they want home health and PT.  All the records are reviewed and case discussed with Care Management/Social Worker. Management plans discussed with the patient, family and they are in agreement.  CODE STATUS: Full code  TOTAL TIME TAKING CARE OF THIS PATIENT: 37 minutes.   More than 50% of the time was spent in counseling/coordination of care: YES  POSSIBLE D/C IN 2 DAYS, DEPENDING ON CLINICAL CONDITION.   Shaune Pollack M.D on 12/30/2016 at 3:19 PM  Between 7am to 6pm - Pager - [phone]  After 6pm go to [email or web address] - Social research officer, government  [facility]  Office  [phone]  CC: Primary care physician; Corky Downs, MD  Note: This dictation was prepared with Dragon dictation along with smaller phrase technology. Any transcriptional errors that result from this process are unintentional.

## 2016-12-30 NOTE — Progress Notes (Signed)
Foley discontinued per MD order

## 2016-12-30 NOTE — Progress Notes (Signed)
MEDICATION RELATED CONSULT NOTE  Pharmacy Consult for piperacillin/tazobactam and electrolyte monitoring/replacement Indication: sepsis possibly from sacral area  Assessment: 55yoM on home apixaban, who presents with sacral bleeding given KCETNRA. Pt found to be septic. Pharmacy consulted for piperacillin/tazobactam dosing. Patient receiving potassium PO daily.   Plan:  1. Continue piperacillin/tazobactam 4.5g IV q8h due to patient weight of 145.5 kg  2. Will replace magnesium 2g IV x 1. Will give additional potassium this evening and transition patient to potassium PO daily.    Allergies  Allergen Reactions  . Clindamycin/Lincomycin Diarrhea    Patient Measurements: Height: 6\' 4"  (193 cm) Weight: (!) 322 lb (146.1 kg) IBW/kg (Calculated) : 86.8  Vital Signs: Temp: 97.7 F (36.5 C) (01/18 1224) BP: 110/70 (01/18 1224) Pulse Rate: 93 (01/18 1224) Intake/Output from previous day: 01/17 0701 - 01/18 0700 In: 782.6 [P.O.:354; I.V.:256.6; IV Piggyback:172] Out: 1075 [Urine:1075] Intake/Output from this shift: Total I/O In: 558 [P.O.:320; I.V.:87; IV Piggyback:151] Out: 700 [Urine:700]  Labs:  Recent Labs  12/28/16 0555 12/29/16 0532 12/30/16 0456  WBC 24.4* 18.7* 17.2*  HGB 9.1* 8.4* 8.8*  HCT 26.6* 25.3* 25.5*  PLT 176 116* 108*  CREATININE 1.47* 1.33* 1.29*  MG 1.6* 1.7 1.8  PHOS 3.6  --   --    Estimated Creatinine Clearance: 99.9 mL/min (by C-G formula based on SCr of 1.29 mg/dL (H)).   Microbiology: Recent Results (from the past 720 hour(s))  Body fluid culture     Status: None   Collection Time: 12/17/16  1:15 PM  Result Value Ref Range Status   Specimen Description PERITONEAL  Final   Special Requests NONE  Final   Gram Stain   Final    FEW WBC PRESENT,BOTH PMN AND MONONUCLEAR NO ORGANISMS SEEN    Culture   Final    No growth aerobically or anaerobically. Performed at [facility]    Report Status 12/20/2016 FINAL  Final   Culture, blood (routine x 2)     Status: None (Preliminary result)   Collection Time: 12/27/16 12:40 AM  Result Value Ref Range Status   Specimen Description BLOOD RIGHT HAND  Final   Special Requests   Final    BOTTLES DRAWN AEROBIC AND ANAEROBIC AERO5ML,ANAERO4ML   Culture NO GROWTH 3 DAYS  Final   Report Status PENDING  Incomplete  Culture, blood (routine x 2)     Status: None (Preliminary result)   Collection Time: 12/27/16 12:40 AM  Result Value Ref Range Status   Specimen Description BLOOD LEFT HAND  Final   Special Requests   Final    BOTTLES DRAWN AEROBIC AND ANAEROBIC AERO3ML,ANAERO10ML   Culture  Setup Time   Final    GRAM POSITIVE RODS ANAEROBIC BOTTLE ONLY CRITICAL RESULT CALLED TO, READ BACK BY AND VERIFIED WITH: HANK ZOMPA 12/29/16 0900 SGD Performed at [facility], [address] [place], [place] [address]    Culture GRAM POSITIVE RODS  Final   Report Status PENDING  Incomplete  Blood Culture ID Panel (Reflexed)     Status: None   Collection Time: 12/27/16 12:40 AM  Result Value Ref Range Status   Enterococcus species NOT DETECTED NOT DETECTED Final   Listeria monocytogenes NOT DETECTED NOT DETECTED Final   Staphylococcus species NOT DETECTED NOT DETECTED Final   Staphylococcus aureus NOT DETECTED NOT DETECTED Final   Streptococcus species NOT DETECTED NOT DETECTED Final   Streptococcus agalactiae NOT DETECTED NOT DETECTED Final   Streptococcus  pneumoniae NOT DETECTED NOT DETECTED Final   Streptococcus pyogenes NOT DETECTED NOT DETECTED Final   Acinetobacter baumannii NOT DETECTED NOT DETECTED Final   Enterobacteriaceae species NOT DETECTED NOT DETECTED Final   Enterobacter cloacae complex NOT DETECTED NOT DETECTED Final   Escherichia coli NOT DETECTED NOT DETECTED Final   Klebsiella oxytoca NOT DETECTED NOT DETECTED Final   Klebsiella pneumoniae NOT DETECTED NOT DETECTED Final   Proteus species NOT DETECTED NOT DETECTED Final   Serratia marcescens NOT  DETECTED NOT DETECTED Final   Haemophilus influenzae NOT DETECTED NOT DETECTED Final   Neisseria meningitidis NOT DETECTED NOT DETECTED Final   Pseudomonas aeruginosa NOT DETECTED NOT DETECTED Final   Candida albicans NOT DETECTED NOT DETECTED Final   Candida glabrata NOT DETECTED NOT DETECTED Final   Candida krusei NOT DETECTED NOT DETECTED Final   Candida parapsilosis NOT DETECTED NOT DETECTED Final   Candida tropicalis NOT DETECTED NOT DETECTED Final  MRSA PCR Screening     Status: None   Collection Time: 12/27/16  6:30 AM  Result Value Ref Range Status   MRSA by PCR NEGATIVE NEGATIVE Final    Comment:        The GeneXpert MRSA Assay (FDA approved for NASAL specimens only), is one component of a comprehensive MRSA colonization surveillance program. It is not intended to diagnose MRSA infection nor to guide or monitor treatment for MRSA infections.   Urine culture     Status: None   Collection Time: 12/27/16 11:32 AM  Result Value Ref Range Status   Specimen Description URINE, CLEAN CATCH  Final   Special Requests NONE  Final   Culture   Final    NO GROWTH Performed at [facility], [address] [place], [place] [id number]    Report Status 12/28/2016 FINAL  Final    Antibiotics: Cipro 1/15 >>1/15 Vancomycin 1/15 >>1/15 Piperacillin/tazo 1/15 >>  Cultures: 1/15 BCx: 1/4 bottles GPR 1/15 BCx: NGTD 1/15 MRSA PCR: negative  1/15 UCx: NG  MLS 12/30/2016 6:18 PM

## 2016-12-30 NOTE — Consult Note (Signed)
[facility] Consult   Reason for Consult:  Consult for 56 year old man currently in the hospital with bleeding and an ulcer on his back Referring Physician:  Bridgett Larsson Patient Identification: [name] MRN:  [id number] Principal Diagnosis: Adjustment disorder with mixed anxiety and depressed mood Diagnosis:   Patient Active Problem List   Diagnosis Date Noted  . Adjustment disorder with mixed anxiety and depressed mood [F43.23] 12/30/2016  . Sepsis ([place]) [A41.9] 12/27/2016  . Alcoholic cirrhosis of liver with ascites ([place]) [K70.31]   . Pressure injury of skin [L89.90] 12/16/2016  . Acute renal failure (ARF) ([place]) [N17.9] 12/15/2016  . Anemia [D64.9] 12/15/2016  . 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 Varicose veins of left lower extremity with both ulcer of calf and inflammation ([place]) [I83.222] 06/25/2016  . Hyponatremia [E87.1] 04/17/2016    Total Time spent with patient: 1 hour  Subjective:   [name] is a 56 y.o. male patient admitted with "I just need to get out of here".  HPI:  Patient interviewed. Chart reviewed. Spoke with the patient and his girlfriend who was present. 38 year old man who denies past psychiatric history. Consult was apparently triggered when a staff person overheard him saying something about how he was going to die in the hospital. On interview today the patient was only partially cooperative. He seemed to be sedated and a little bit confused which seemed a little unusual. Girlfriend said she thinks it's just from the pain medicine although he seemed pretty remarkably confused for just a 5 mg Percocet. In any case the patient says that he is just irritated and frustrated about being in the hospital. He says he feels he has not gotten a straight answer and gotten enough information. At the same time he is not really able to articulate me anything that he is  particularly confused about. He frequently says that he simply needs to get out of the hospital and get back home and he will feel much better. He indicates that he does not like having a lack of control over his environment and situation. He denies that he is feeling consistently sad or depressed. Denies sleeping problems denies appetite problems. Absolutely denies any suicidal thoughts. Girlfriend confirms that the statement he made as not that he was going to kill himself or thought that he was going to die but was a somewhat histrionic statement about how staying in the hospital any longer would "kill him". They have no concerns about any dangerousness on his part. Patient denies any psychotic symptoms. He is not currently on any psychiatric medicine. Patient denies that he had been on any psychiatric medicine and also denies any alcohol abuse history.  At home with his girlfriend. Just the 2 of them at home. It was little unclear to me what his normal activity was like. Sounds like he probably doesn't do very much.  Medical history: Patient has come into the hospital with rectal bleeding and also has a sacral decubitus that apparently is not healing. He has a diagnosis of cirrhosis. I'm still uncertain about my understanding of that. The diagnosis is placed as alcoholic cirrhosis. The patient absolutely denies any recent drinking and denies that he's ever had a problem with drinking in the past.  Substance abuse history: As noted above he denies any history of alcohol abuse. I'm not sure whether this is correct or not.  Past Psychiatric History: No history of psychiatric  treatment. No history of psychiatric medicine or hospitalization. No history of suicide attempts or violence. Girlfriend says she does not have any concern about any mental health history. She tends to excuse his current confusion and cognitive impairment as simply being the result of his pain medicine and says that he was not having  that degree of cognitive impairment at home.  Risk to Self: Is patient at risk for suicide?: No Risk to Others:   Prior Inpatient Therapy:   Prior Outpatient Therapy:    Past Medical History:  Past Medical History:  Diagnosis Date  . Anal fissure   . Atrial fibrillation ([place])   . CHF (congestive heart failure) ([place])   . Heart murmur   . Hypertension   . Lymphadenopathy   . Personal history of colonic polyps     Past Surgical History:  Procedure Laterality Date  . COLONOSCOPY  2012  . ESOPHAGOGASTRODUODENOSCOPY (EGD) WITH PROPOFOL N/A 12/18/2016   Procedure: ESOPHAGOGASTRODUODENOSCOPY (EGD) WITH PROPOFOL;  Surgeon: Lin Landsman, MD;  Location: [facility] ENDOSCOPY;  Service: Endoscopy;  Laterality: N/A;  . HERNIA REPAIR  2012  . larynx-amyloidosis-laser surgery   2010   Family History:  Family History  Problem Relation Age of Onset  . Hypertension Other   . Hypertension Brother    Family Psychiatric  History: Denies any family history of mental illness Social History:  History  Alcohol Use  . Yes     History  Drug Use No    Social History   Social History  . Marital status: Divorced    Spouse name: N/A  . Number of children: N/A  . Years of education: N/A   Social History Main Topics  . Smoking status: Current Every Day Smoker    Packs/day: 1.00    Years: 20.00  . Smokeless tobacco: Never Used  . Alcohol use Yes  . Drug use: No  . Sexual activity: Not Asked   Other Topics Concern  . None   Social History Narrative  . None   Additional Social History:    Allergies:   Allergies  Allergen Reactions  . Clindamycin/Lincomycin Diarrhea    Labs:  Results for orders placed or performed during the hospital encounter of 12/26/16 (from the past 48 hour(s))  Glucose, capillary     Status: Abnormal   Collection Time: 12/28/16  8:42 PM  Result Value Ref Range   Glucose-Capillary 145 (H) 65 - 99 mg/dL   Comment 1 Notify RN   Procalcitonin     Status: None    Collection Time: 12/29/16  5:32 AM  Result Value Ref Range   Procalcitonin 3.51 ng/mL    Comment:        Interpretation: PCT > 2 ng/mL: Systemic infection (sepsis) is likely, unless other causes are known. (NOTE)         ICU PCT Algorithm               Non ICU PCT Algorithm    ----------------------------     ------------------------------         PCT < 0.25 ng/mL                 PCT < 0.1 ng/mL     Stopping of antibiotics            Stopping of antibiotics       strongly encouraged.               strongly encouraged.    ----------------------------     ------------------------------  PCT level decrease by               PCT < 0.25 ng/mL       >= 80% from peak PCT       OR PCT 0.25 - 0.5 ng/mL          Stopping of antibiotics                                             encouraged.     Stopping of antibiotics           encouraged.    ----------------------------     ------------------------------       PCT level decrease by              PCT >= 0.25 ng/mL       < 80% from peak PCT        AND PCT >= 0.5 ng/mL            Continuing antibiotics                                               encouraged.       Continuing antibiotics            encouraged.    ----------------------------     ------------------------------     PCT level increase compared          PCT > 0.5 ng/mL         with peak PCT AND          PCT >= 0.5 ng/mL             Escalation of antibiotics                                          strongly encouraged.      Escalation of antibiotics        strongly encouraged.   Basic metabolic panel     Status: Abnormal   Collection Time: 12/29/16  5:32 AM  Result Value Ref Range   Sodium 131 (L) 135 - 145 mmol/L   Potassium 4.1 3.5 - 5.1 mmol/L   Chloride 99 (L) 101 - 111 mmol/L   CO2 27 22 - 32 mmol/L   Glucose, Bld 141 (H) 65 - 99 mg/dL   BUN 34 (H) 6 - 20 mg/dL   Creatinine, Ser 1.33 (H) 0.61 - 1.24 mg/dL   Calcium 8.2 (L) 8.9 - 10.3 mg/dL   GFR calc non Af  Amer 58 (L) >60 mL/min   GFR calc Af Amer >60 >60 mL/min    Comment: (NOTE) The eGFR has been calculated using the CKD EPI equation. This calculation has not been validated in all clinical situations. eGFR's persistently <60 mL/min signify possible Chronic Kidney Disease.    Anion gap 5 5 - 15  Magnesium     Status: None   Collection Time: 12/29/16  5:32 AM  Result Value Ref Range   Magnesium 1.7 1.7 - 2.4 mg/dL  CBC     Status: Abnormal   Collection Time: 12/29/16  5:32 AM  Result Value Ref Range  WBC 18.7 (H) 3.8 - 10.6 K/uL   RBC 2.48 (L) 4.40 - 5.90 MIL/uL   Hemoglobin 8.4 (L) 13.0 - 18.0 g/dL   HCT 25.3 (L) 40.0 - 52.0 %   MCV 101.9 (H) 80.0 - 100.0 fL   MCH 33.9 26.0 - 34.0 pg   MCHC 33.2 32.0 - 36.0 g/dL   RDW 17.4 (H) 11.5 - 14.5 %   Platelets 116 (L) 150 - 440 K/uL  Glucose, capillary     Status: Abnormal   Collection Time: 12/29/16  7:09 AM  Result Value Ref Range   Glucose-Capillary 131 (H) 65 - 99 mg/dL  Glucose, capillary     Status: Abnormal   Collection Time: 12/29/16 11:43 AM  Result Value Ref Range   Glucose-Capillary 140 (H) 65 - 99 mg/dL  Glucose, capillary     Status: Abnormal   Collection Time: 12/29/16  5:23 PM  Result Value Ref Range   Glucose-Capillary 146 (H) 65 - 99 mg/dL  Glucose, capillary     Status: Abnormal   Collection Time: 12/29/16  9:31 PM  Result Value Ref Range   Glucose-Capillary 113 (H) 65 - 99 mg/dL  CBC     Status: Abnormal   Collection Time: 12/30/16  4:56 AM  Result Value Ref Range   WBC 17.2 (H) 3.8 - 10.6 K/uL   RBC 2.52 (L) 4.40 - 5.90 MIL/uL   Hemoglobin 8.8 (L) 13.0 - 18.0 g/dL   HCT 25.5 (L) 40.0 - 52.0 %   MCV 101.3 (H) 80.0 - 100.0 fL   MCH 35.0 (H) 26.0 - 34.0 pg   MCHC 34.5 32.0 - 36.0 g/dL   RDW 17.3 (H) 11.5 - 14.5 %   Platelets 108 (L) 150 - 440 K/uL  Basic metabolic panel     Status: Abnormal   Collection Time: 12/30/16  4:56 AM  Result Value Ref Range   Sodium 131 (L) 135 - 145 mmol/L   Potassium 3.8  3.5 - 5.1 mmol/L   Chloride 99 (L) 101 - 111 mmol/L   CO2 27 22 - 32 mmol/L   Glucose, Bld 159 (H) 65 - 99 mg/dL   BUN 32 (H) 6 - 20 mg/dL   Creatinine, Ser 1.29 (H) 0.61 - 1.24 mg/dL   Calcium 7.8 (L) 8.9 - 10.3 mg/dL   GFR calc non Af Amer >60 >60 mL/min   GFR calc Af Amer >60 >60 mL/min    Comment: (NOTE) The eGFR has been calculated using the CKD EPI equation. This calculation has not been validated in all clinical situations. eGFR's persistently <60 mL/min signify possible Chronic Kidney Disease.    Anion gap 5 5 - 15  Magnesium     Status: None   Collection Time: 12/30/16  4:56 AM  Result Value Ref Range   Magnesium 1.8 1.7 - 2.4 mg/dL  Glucose, capillary     Status: Abnormal   Collection Time: 12/30/16  7:23 AM  Result Value Ref Range   Glucose-Capillary 134 (H) 65 - 99 mg/dL  Glucose, capillary     Status: Abnormal   Collection Time: 12/30/16 11:31 AM  Result Value Ref Range   Glucose-Capillary 173 (H) 65 - 99 mg/dL  Glucose, capillary     Status: Abnormal   Collection Time: 12/30/16  4:22 PM  Result Value Ref Range   Glucose-Capillary 173 (H) 65 - 99 mg/dL    Current Facility-Administered Medications  Medication Dose Route Frequency Provider Last Rate Last Dose  . acetaminophen (TYLENOL)  tablet 650 mg  650 mg Oral Q6H PRN Harrie Foreman, MD       Or  . acetaminophen (TYLENOL) suppository 650 mg  650 mg Rectal Q6H PRN Harrie Foreman, MD      . ammonium lactate (LAC-HYDRIN) 12 % lotion   Topical Daily Clayburn Pert, MD      . diphenhydrAMINE (BENADRYL) injection 12.5 mg  12.5 mg Intravenous Once Harrie Foreman, MD      . docusate sodium (COLACE) capsule 100 mg  100 mg Oral BID Harrie Foreman, MD   100 mg at 12/30/16 1114  . folic acid (FOLVITE) tablet 1 mg  1 mg Oral Daily Harrie Foreman, MD   1 mg at 12/30/16 1114  . furosemide (LASIX) tablet 40 mg  40 mg Oral Daily Harrie Foreman, MD   40 mg at 12/30/16 1114  . insulin aspart (novoLOG)  injection 2-6 Units  2-6 Units Subcutaneous TID AC & HS Laverle Hobby, MD   4 Units at 12/30/16 1654  . ondansetron (ZOFRAN) tablet 4 mg  4 mg Oral Q6H PRN Harrie Foreman, MD       Or  . ondansetron [facility]) injection 4 mg  4 mg Intravenous Q6H PRN Harrie Foreman, MD      . oxyCODONE (Oxy IR/ROXICODONE) immediate release tablet 5 mg  5 mg Oral Q6H PRN Merilyn Baba, RPH   5 mg at 12/30/16 1448  . piperacillin-tazobactam (ZOSYN) IVPB 4.5 g  4.5 g Intravenous Q8H Laverle Hobby, MD   4.5 g at 12/30/16 1514  . pneumococcal 23 valent vaccine (PNU-IMMUNE) injection 0.5 mL  0.5 mL Intramuscular Tomorrow-1000 Laverle Hobby, MD      . potassium chloride (K-DUR,KLOR-CON) CR tablet 10 mEq  10 mEq Oral Daily Harrie Foreman, MD   10 mEq at 12/30/16 1114  . protein supplement (PREMIER PROTEIN) liquid  11 oz Oral BID BM Demetrios Loll, MD   11 oz at 12/30/16 1122  . sodium chloride flush (NS) 0.9 % injection 3 mL  3 mL Intravenous Q12H Harrie Foreman, MD   3 mL at 12/30/16 1000  . sodium hypochlorite (DAKIN'S 1/4 STRENGTH) topical solution   Irrigation BID Olean Ree, MD      . sotalol (BETAPACE) tablet 80 mg  80 mg Oral BID Harrie Foreman, MD   80 mg at 12/30/16 1322    Musculoskeletal: Strength & Muscle Tone: decreased Gait & Station: unable to stand Patient leans: N/A  Psychiatric Specialty Exam: Physical Exam  Nursing note and vitals reviewed. Constitutional: He appears well-developed and well-nourished.  HENT:  Head: Normocephalic and atraumatic.  Eyes: Conjunctivae are normal. Pupils are equal, round, and reactive to light.  Neck: Normal range of motion.  Cardiovascular: Regular rhythm and normal heart sounds.   Respiratory: Effort normal. No respiratory distress.  GI: Soft. He exhibits no distension.  Musculoskeletal: Normal range of motion.  Neurological: He is alert.  Skin: Skin is warm and dry.  Psychiatric: His affect is blunt and inappropriate. His  speech is tangential. He is withdrawn. Thought content is not paranoid. He expresses impulsivity. He expresses no homicidal and no suicidal ideation. He exhibits abnormal recent memory.    Review of Systems  Constitutional: Negative.   HENT: Negative.   Eyes: Negative.   Respiratory: Negative.   Cardiovascular: Negative.   Gastrointestinal: Negative.   Musculoskeletal: Positive for back pain.  Skin: Negative.   Neurological: Negative.   Psychiatric/Behavioral: Negative for  depression, hallucinations, memory loss, substance abuse and suicidal ideas. The patient is not nervous/anxious and does not have insomnia.     Blood pressure 110/70, pulse 93, temperature 97.7 F (36.5 C), resp. rate 18, height 6' 4"  (1.93 m), weight (!) 146.1 kg (322 lb), SpO2 100 %.Body mass index is 39.2 kg/m.  General Appearance: Disheveled  Eye Contact:  Minimal  Speech:  Slow and Slurred  Volume:  Decreased  Mood:  Irritable  Affect:  Congruent  Thought Process:  Disorganized  Orientation:  Full (Time, Place, and Person)  Thought Content:  Tangential  Suicidal Thoughts:  No  Homicidal Thoughts:  No  Memory:  Immediate;   Fair Recent;   Poor Remote;   Fair  Judgement:  Impaired  Insight:  Shallow  Psychomotor Activity:  Decreased  Concentration:  Concentration: Fair  Recall:  [employer] of Knowledge:  Fair  Language:  Fair  Akathisia:  No  Handed:  Right  AIMS (if indicated):     Assets:  Desire for Improvement Housing Intimacy Social Support  ADL's:  Impaired  Cognition:  Impaired,  Mild  Sleep:        Treatment Plan Summary: Daily contact with patient to assess and evaluate symptoms and progress in treatment and Plan 56 year old man who had made some sort of comment on the phone about how staying in the hospital was going to kill him. There is no evidence here that he was actually having thoughts of self-harm. Patient is not at elevated risk of suicide. Does not appear to have a major  depression. I am a little concerned about what seems to be more cognitive impairment than I would expect from his current medical condition. I will continue to follow-up to see how that changes while he is in the hospital. I'm not sure if an ammonia level is been checked since he's been here but it might be a good idea to check it if it hasn't been done already. No need for sitter or psychiatric hospitalization.  Disposition: Patient does not meet criteria for psychiatric inpatient admission.  Alethia Berthold, MD 12/30/2016 4:57 PM

## 2016-12-30 NOTE — Evaluation (Signed)
Physical Therapy Evaluation Patient Details Name: [name] MRN: [id number] DOB: 07-06-1961 Today's Date: 12/30/2016   History of Present Illness  presented to ER secondary to rectal bleeding; admitted with septic shock secondary to necrotic sacral ulcer.  Clinical Impression  Upon evaluation, patient lethargic, but interactive with therapist; willing to participate, but shows limited active effort/ability with functional activities.  Bilat Ue/LEs globally weak and deconditioned; difficulty mobilizing against gravity in all planes.  Currently requiring max/total assist +2 for supine/sit and unsupported sitting balance.  Absent self-righting reactions with persistent L posterior/lateral lean noted (unable to position LEs in [place] to assist with stabilization).  Unsafe/unable to attempt additional mobility at this time. Would benefit from skilled PT to address above deficits and promote optimal return to PLOF; recommend transition to STR upon discharge from acute hospitalization, as patient currently total care and unsafe/unable to negotiate home environment.     Follow Up Recommendations SNF    Equipment Recommendations       Recommendations for Other Services       Precautions / Restrictions Precautions Precautions: Fall Restrictions Weight Bearing Restrictions: No      Mobility  Bed Mobility Overal bed mobility: Needs Assistance Bed Mobility: Rolling;Supine to Sit;Sit to Supine Rolling: Max assist;+2 for physical assistance   Supine to sit: Total assist;+2 for physical assistance Sit to supine: Total assist;+2 for physical assistance   General bed mobility comments: very limited active assist with any movement transitions  Transfers                 General transfer comment: deferred secondary to level of assist required for bed mobility/sitting balance  Ambulation/Gait             General Gait Details: deferred secondary to level of assist required  for bed mobility/sitting balance  Stairs            Wheelchair Mobility    Modified Rankin (Stroke Patients Only)       Balance Overall balance assessment: Needs assistance Sitting-balance support: No upper extremity supported Sitting balance-Leahy Scale: Zero Sitting balance - Comments: absent self righting; dep for upright positioning and balance                                     Pertinent Vitals/Pain Pain Assessment: Faces Faces Pain Scale: Hurts little more Pain Location: generalized soreness Pain Descriptors / Indicators: Aching;Grimacing Pain Intervention(s): Limited activity within patient's tolerance;Monitored during session;Repositioned    Home Living Family/patient expects to be discharged to:: Private residence Living Arrangements: Spouse/significant other Available Help at Discharge: Family;Available 24 hours/day Type of Home: Apartment       Home Layout: Two level;1/2 bath on main level   Additional Comments: Has been sleeping in recliner/lift chair in recent weeks    Prior Function Level of Independence: Needs assistance         Comments: Limited household mobility; assist from girlfriend for ADLs, mobility as needed.     Hand Dominance        Extremity/Trunk Assessment   Upper Extremity Assessment Upper Extremity Assessment: Generalized weakness    Lower Extremity Assessment Lower Extremity Assessment: Generalized weakness (grossly 2+/5 throughout; generalized pitting edema, 3+, throughout bilat LEs)       Communication   Communication:  (speech mumbled, garbled at times)  Cognition Arousal/Alertness: Lethargic Behavior During Therapy: WFL for tasks assessed/performed Overall Cognitive Status: Difficult to assess  General Comments: delayed processing; poor insight/awarenses; poor task initiatino; slightly irritable at times    General Comments      Exercises Other Exercises Other  Exercises: Rolling bilat, max assist +2; hand-over-hand for UE support. Dep assist for hygiene.   Assessment/Plan    PT Assessment Patient needs continued PT services  PT Problem List Decreased strength;Decreased mobility;Decreased range of motion;Decreased activity tolerance;Decreased balance;Pain;Obesity;Cardiopulmonary status limiting activity;Decreased cognition;Decreased knowledge of use of DME;Decreased safety awareness;Decreased knowledge of precautions;Decreased skin integrity          PT Treatment Interventions Gait training;Therapeutic exercise;Therapeutic activities;Stair training;Balance training;Patient/family education;Functional mobility training    PT Goals (Current goals can be found in the Care Plan section)  Acute Rehab PT Goals Patient Stated Goal: return home PT Goal Formulation: With patient Time For Goal Achievement: 01/13/17 Potential to Achieve Goals: Fair    Frequency Min 2X/week   Barriers to discharge Inaccessible home environment      Co-evaluation               End of Session   Activity Tolerance: Patient limited by fatigue Patient left: in bed;with call bell/phone within reach Nurse Communication: Mobility status         Time: 1610-9604 PT Time Calculation (min) (ACUTE ONLY): 22 min   Charges:   PT Evaluation $PT Eval Moderate Complexity: 1 Procedure PT Treatments $Therapeutic Activity: 8-22 mins   PT G Codes:        Forrestine Lecrone H. Manson Passey, PT, DPT, NCS 12/30/16, 1:23 PM [phone]

## 2016-12-31 LAB — BASIC METABOLIC PANEL
ANION GAP: 5 (ref 5–15)
BUN: 30 mg/dL — ABNORMAL HIGH (ref 6–20)
CHLORIDE: 96 mmol/L — AB (ref 101–111)
CO2: 28 mmol/L (ref 22–32)
Calcium: 8 mg/dL — ABNORMAL LOW (ref 8.9–10.3)
Creatinine, Ser: 1.16 mg/dL (ref 0.61–1.24)
Glucose, Bld: 147 mg/dL — ABNORMAL HIGH (ref 65–99)
POTASSIUM: 4 mmol/L (ref 3.5–5.1)
SODIUM: 129 mmol/L — AB (ref 135–145)

## 2016-12-31 LAB — GLUCOSE, CAPILLARY
Glucose-Capillary: 146 mg/dL — ABNORMAL HIGH (ref 65–99)
Glucose-Capillary: 104 mg/dL — ABNORMAL HIGH (ref 65–99)
Glucose-Capillary: 100 mg/dL — ABNORMAL HIGH (ref 65–99)
Glucose-Capillary: 157 mg/dL — ABNORMAL HIGH (ref 65–99)

## 2016-12-31 LAB — CBC
HEMATOCRIT: 26.9 % — AB (ref 40.0–52.0)
Hemoglobin: 9.4 g/dL — ABNORMAL LOW (ref 13.0–18.0)
MCH: 34.5 pg — ABNORMAL HIGH (ref 26.0–34.0)
MCHC: 34.8 g/dL (ref 32.0–36.0)
MCV: 99.1 fL (ref 80.0–100.0)
Platelets: 120 10*3/uL — ABNORMAL LOW (ref 150–440)
RBC: 2.71 MIL/uL — AB (ref 4.40–5.90)
RDW: 17.4 % — ABNORMAL HIGH (ref 11.5–14.5)
WBC: 14 10*3/uL — AB (ref 3.8–10.6)

## 2016-12-31 NOTE — Progress Notes (Signed)
[facility] - [place] [facility]   PATIENT NAME: [name] BrasMichael Sainato    MR#:  [id number]  DATE OF BIRTH:  04-Apr-1961  SUBJECTIVE:  CHIEF COMPLAINT:   Chief Complaint  Patient presents with  . Skin Ulcer    Sacraum bleeding    Weakness. REVIEW OF SYSTEMS:  Review of Systems  Constitutional: Positive for malaise/fatigue. Negative for chills and fever.  HENT: Negative for congestion.   Eyes: Negative for blurred vision and double vision.  Respiratory: Negative for cough, shortness of breath, wheezing and stridor.   Cardiovascular: Negative for chest pain and leg swelling.  Gastrointestinal: Negative for abdominal pain, blood in stool, diarrhea, melena, nausea and vomiting.  Genitourinary: Negative for dysuria, frequency and hematuria.  Musculoskeletal: Negative for back pain and joint pain.  Skin: Negative for itching and rash.  Neurological: Positive for weakness. Negative for dizziness, focal weakness and loss of consciousness.  Psychiatric/Behavioral: Negative for depression. The patient is not nervous/anxious.     DRUG ALLERGIES:   Allergies  Allergen Reactions  . Clindamycin/Lincomycin Diarrhea   VITALS:  Blood pressure 109/75, pulse 80, temperature 97.6 F (36.4 C), resp. rate 16, height 6\' 4"  (1.93 m), weight (!) 328 lb (148.8 kg), SpO2 98 %. PHYSICAL EXAMINATION:  Physical Exam  Constitutional: He is oriented to person, place, and time. No distress.  Morbid obese.  HENT:  Head: Normocephalic.  Mouth/Throat: Oropharynx is clear and moist.  Eyes: Conjunctivae and EOM are normal.  Neck: Normal range of motion. Neck supple. No JVD present. No tracheal deviation present.  Cardiovascular: Normal rate, regular rhythm and normal heart sounds.   No murmur heard. Pulmonary/Chest: Effort normal and breath sounds normal. No respiratory distress. He has no wheezes. He has no rales.  Abdominal: Soft. Bowel sounds are normal. He exhibits no distension.  There is no tenderness.  Genitourinary:  Genitourinary Comments: Penis edema.  Musculoskeletal: Normal range of motion. He exhibits edema. He exhibits no tenderness.  Neurological: He is alert and oriented to person, place, and time. No cranial nerve deficit.  Skin: No rash noted. No erythema.  sacral ulcer with dry occlusive dressing  Psychiatric: Affect normal.   LABORATORY PANEL:   CBC  Recent Labs Lab 12/31/16 0526  WBC 14.0*  HGB 9.4*  HCT 26.9*  PLT 120*   ------------------------------------------------------------------------------------------------------------------ Chemistries   Recent Labs Lab 12/26/16 2351  12/30/16 0456 12/31/16 0526  NA 129*  < > 131* 129*  K 5.5*  < > 3.8 4.0  CL 93*  < > 99* 96*  CO2 22  < > 27 28  GLUCOSE 142*  < > 159* 147*  BUN 39*  < > 32* 30*  CREATININE 1.71*  < > 1.29* 1.16  CALCIUM 8.2*  < > 7.8* 8.0*  MG  --   < > 1.8  --   AST 54*  --   --   --   ALT 27  --   --   --   ALKPHOS 153*  --   --   --   BILITOT 2.0*  --   --   --   < > = values in this interval not displayed. RADIOLOGY:  No results found. ASSESSMENT AND PLAN:  56 y/o WM with multiple comorbidities presenting with hypotension likely bleeding sacral ulcers while on anticoagulation.   Hemorrhagic shock, due to bleeding sacral ulcers while on anticoagulation. Improved with fluid resuscitation. Discontinue Lotensin and Lasix due to hypotension. He was weaned off Levophed.  Blood loss anemia-due to bleeding rectal decub-- now stable.  AKI on CKD 2-- improved to baseline with IV fluid support.  Hyperkalemia. Improved.  Hyponatremia. Sodium level is worsening. Follow up BMP.  Chronic diastolic Congestive heart failure; Echo 12/18/16; EF=45%. Stable. Continue Lasix. Chronic Atrial fibrillation on anticoagulation, discontinued anticoagulation (Eliquis) in light of bleeding decub ulcers, he is no longer a good candidate for anticoagulation per Dr. Linward Natal.  Continue sotalol.  Alcoholic liver cirrhosis Ascites, s/p Paracentesis 6.2 L.  Sacral DU. Initially suspected sepsis due to sacral DU infection. But according to surgeon, no infection, Daily dressings per wound care recommendations. DC abx (on zosyn) after 5 day course per Dr. Linward Natal.  Leukocytosis and elevated procalcitonin. Improving,  WBC is down to 14,000. follow-up CBC  Per PT evaluation, the patient and need skilled nursing facility, but the patient and his girlfriend declined, they want home health and PT.  All the records are reviewed and case discussed with Care Management/Social Worker. Management plans discussed with the patient, family and they are in agreement.  CODE STATUS: Full code  TOTAL TIME TAKING CARE OF THIS PATIENT: 32 minutes.   More than 50% of the time was spent in counseling/coordination of care: YES  POSSIBLE D/C IN 2 DAYS, DEPENDING ON CLINICAL CONDITION.   Shaune Pollack M.D on 12/31/2016 at 3:19 PM  Between 7am to 6pm - Pager - [phone]  After 6pm go to [email or web address] - Social research officer, government  [facility]  Office  [phone]  CC: Primary care physician; Corky Downs, MD  Note: This dictation was prepared with Dragon dictation along with smaller phrase technology. Any transcriptional errors that result from this process are unintentional.

## 2016-12-31 NOTE — Plan of Care (Signed)
Problem: Health Behavior: Goal: Ability to manage health-related needs will improve Outcome: Not Progressing Pt requires assistance

## 2016-12-31 NOTE — Plan of Care (Signed)
Problem: Skin Integrity: Goal: Risk for impaired skin integrity will decrease Outcome: Progressing Wound is being treated with daily dressing changes.

## 2016-12-31 NOTE — Progress Notes (Signed)
MEDICATION RELATED CONSULT NOTE  Pharmacy Consult for piperacillin/tazobactam and electrolyte monitoring/replacement Indication: sepsis possibly from sacral area  Assessment: 55yoM on home apixaban, who presents with sacral bleeding given KCETNRA. Pt found to be septic. Pharmacy consulted for piperacillin/tazobactam dosing. Patient receiving potassium 20mEq PO daily.   Plan:  1. Continue piperacillin/tazobactam 4.5g IV q8h due to patient weight of 145.5 kg, this will be Day 5 of therapy. Will follow up with MD about stop date.  2. Electrolytes are WNL. No additional supplementation is needed. Will follow up on AM labs.   Allergies  Allergen Reactions  . Clindamycin/Lincomycin Diarrhea    Patient Measurements: Height: 6\' 4"  (193 cm) Weight: (!) 328 lb (148.8 kg) IBW/kg (Calculated) : 86.8  Vital Signs: Temp: 98.4 F (36.9 C) (01/19 0756) Temp Source: Oral (01/19 0450) BP: 95/79 (01/19 0756) Pulse Rate: 83 (01/19 0756) Intake/Output from previous day: 01/18 0701 - 01/19 0700 In: 618 [P.O.:320; I.V.:90; IV Piggyback:208] Out: 1000 [Urine:1000] Intake/Output from this shift: No intake/output data recorded.  Labs:  Recent Labs  12/29/16 0532 12/30/16 0456 12/31/16 0526  WBC 18.7* 17.2* 14.0*  HGB 8.4* 8.8* 9.4*  HCT 25.3* 25.5* 26.9*  PLT 116* 108* 120*  CREATININE 1.33* 1.29* 1.16  MG 1.7 1.8  --    Estimated Creatinine Clearance: 112.2 mL/min (by C-G formula based on SCr of 1.16 mg/dL).   Microbiology: Recent Results (from the past 720 hour(s))  Body fluid culture     Status: None   Collection Time: 12/17/16  1:15 PM  Result Value Ref Range Status   Specimen Description PERITONEAL  Final   Special Requests NONE  Final   Gram Stain   Final    FEW WBC PRESENT,BOTH PMN AND MONONUCLEAR NO ORGANISMS SEEN    Culture   Final    No growth aerobically or anaerobically. Performed at [facility]    Report Status 12/20/2016 FINAL  Final  Culture, blood  (routine x 2)     Status: None (Preliminary result)   Collection Time: 12/27/16 12:40 AM  Result Value Ref Range Status   Specimen Description BLOOD RIGHT HAND  Final   Special Requests   Final    BOTTLES DRAWN AEROBIC AND ANAEROBIC AERO5ML,ANAERO4ML   Culture NO GROWTH 4 DAYS  Final   Report Status PENDING  Incomplete  Culture, blood (routine x 2)     Status: None (Preliminary result)   Collection Time: 12/27/16 12:40 AM  Result Value Ref Range Status   Specimen Description BLOOD LEFT HAND  Final   Special Requests   Final    BOTTLES DRAWN AEROBIC AND ANAEROBIC AERO3ML,ANAERO10ML   Culture  Setup Time   Final    GRAM POSITIVE RODS ANAEROBIC BOTTLE ONLY CRITICAL RESULT CALLED TO, READ BACK BY AND VERIFIED WITH: HANK ZOMPA 12/29/16 0900 SGD Performed at [facility], [address] [place], [place] [address]    Culture GRAM POSITIVE RODS  Final   Report Status PENDING  Incomplete  Blood Culture ID Panel (Reflexed)     Status: None   Collection Time: 12/27/16 12:40 AM  Result Value Ref Range Status   Enterococcus species NOT DETECTED NOT DETECTED Final   Listeria monocytogenes NOT DETECTED NOT DETECTED Final   Staphylococcus species NOT DETECTED NOT DETECTED Final   Staphylococcus aureus NOT DETECTED NOT DETECTED Final   Streptococcus species NOT DETECTED NOT DETECTED Final   Streptococcus agalactiae NOT DETECTED NOT DETECTED Final   Streptococcus pneumoniae NOT DETECTED NOT  DETECTED Final   Streptococcus pyogenes NOT DETECTED NOT DETECTED Final   Acinetobacter baumannii NOT DETECTED NOT DETECTED Final   Enterobacteriaceae species NOT DETECTED NOT DETECTED Final   Enterobacter cloacae complex NOT DETECTED NOT DETECTED Final   Escherichia coli NOT DETECTED NOT DETECTED Final   Klebsiella oxytoca NOT DETECTED NOT DETECTED Final   Klebsiella pneumoniae NOT DETECTED NOT DETECTED Final   Proteus species NOT DETECTED NOT DETECTED Final   Serratia marcescens NOT DETECTED NOT  DETECTED Final   Haemophilus influenzae NOT DETECTED NOT DETECTED Final   Neisseria meningitidis NOT DETECTED NOT DETECTED Final   Pseudomonas aeruginosa NOT DETECTED NOT DETECTED Final   Candida albicans NOT DETECTED NOT DETECTED Final   Candida glabrata NOT DETECTED NOT DETECTED Final   Candida krusei NOT DETECTED NOT DETECTED Final   Candida parapsilosis NOT DETECTED NOT DETECTED Final   Candida tropicalis NOT DETECTED NOT DETECTED Final  MRSA PCR Screening     Status: None   Collection Time: 12/27/16  6:30 AM  Result Value Ref Range Status   MRSA by PCR NEGATIVE NEGATIVE Final    Comment:        The GeneXpert MRSA Assay (FDA approved for NASAL specimens only), is one component of a comprehensive MRSA colonization surveillance program. It is not intended to diagnose MRSA infection nor to guide or monitor treatment for MRSA infections.   Urine culture     Status: None   Collection Time: 12/27/16 11:32 AM  Result Value Ref Range Status   Specimen Description URINE, CLEAN CATCH  Final   Special Requests NONE  Final   Culture   Final    NO GROWTH Performed at [facility], [address] [place], [place] [address]    Report Status 12/28/2016 FINAL  Final    Antibiotics: Cipro 1/15 >>1/15 Vancomycin 1/15 >>1/15 Piperacillin/tazo 1/15 >>  Cultures: 1/15 BCx: 1/4 bottles GPR 1/15 BCx: NGTD 1/15 MRSA PCR: negative  1/15 UCx: NG  Clovia Cuff, PharmD, BCPS 12/31/2016 11:25 AM

## 2017-01-01 LAB — BASIC METABOLIC PANEL
ANION GAP: 6 (ref 5–15)
BUN: 33 mg/dL — AB (ref 6–20)
CO2: 26 mmol/L (ref 22–32)
Calcium: 8 mg/dL — ABNORMAL LOW (ref 8.9–10.3)
Chloride: 95 mmol/L — ABNORMAL LOW (ref 101–111)
Creatinine, Ser: 1.24 mg/dL (ref 0.61–1.24)
Glucose, Bld: 164 mg/dL — ABNORMAL HIGH (ref 65–99)
POTASSIUM: 4.4 mmol/L (ref 3.5–5.1)
SODIUM: 127 mmol/L — AB (ref 135–145)

## 2017-01-01 LAB — MAGNESIUM: Magnesium: 1.7 mg/dL (ref 1.7–2.4)

## 2017-01-01 LAB — GLUCOSE, CAPILLARY
Glucose-Capillary: 156 mg/dL — ABNORMAL HIGH (ref 65–99)
Glucose-Capillary: 136 mg/dL — ABNORMAL HIGH (ref 65–99)
Glucose-Capillary: 131 mg/dL — ABNORMAL HIGH (ref 65–99)
Glucose-Capillary: 118 mg/dL — ABNORMAL HIGH (ref 65–99)

## 2017-01-01 MED ORDER — TOLVAPTAN 15 MG PO TABS
15.0000 mg | ORAL_TABLET | Freq: Once | ORAL | Status: DC
  Filled 2017-01-01: qty 1

## 2017-01-01 MED ORDER — PIPERACILLIN-TAZOBACTAM 4.5 G IVPB
4.5000 g | Freq: Three times a day (TID) | INTRAVENOUS | Status: AC
  Administered 2017-01-01 – 2017-01-03 (×6): 4.5 g via INTRAVENOUS
  Filled 2017-01-01 (×6): qty 100

## 2017-01-01 MED ORDER — MAGNESIUM SULFATE 2 GM/50ML IV SOLN
2.0000 g | Freq: Once | INTRAVENOUS | Status: AC
  Administered 2017-01-01: 2 g via INTRAVENOUS
  Filled 2017-01-01: qty 50

## 2017-01-01 NOTE — Plan of Care (Signed)
Problem: Nutritional: Goal: Maintenance of adequate nutrition will improve Outcome: Progressing Educated the patient on the importance of eating a high protein diet to help with wound healing

## 2017-01-01 NOTE — Progress Notes (Signed)
Patient ID: [name], male   DOB: September 02, 1961, 56 y.o.   MRN: [id number]  [facility] PROGRESS NOTE  [name] JZP:[id number] DOB: 1961/10/04 DOA: 12/26/2016 PCP: Cletis Athens, MD  HPI/Subjective: Patient is upset about his care. Requested a transfer to [facility] but unfortunately no beds available and he was not accepted. Patient feels very weak.  Objective: Vitals:   01/01/17 0441 01/01/17 0817  BP: 105/81 119/77  Pulse: 88 87  Resp: 17 16  Temp: 97.5 F (36.4 C) 98 F (36.7 C)    Filed Weights   12/30/16 0459 12/31/16 0455 01/01/17 0500  Weight: (!) 146.1 kg (322 lb) (!) 148.8 kg (328 lb) (!) 148.8 kg (328 lb)    ROS: Review of Systems  Constitutional: Negative for chills and fever.  Eyes: Negative for blurred vision.  Respiratory: Negative for cough and shortness of breath.   Cardiovascular: Negative for chest pain.  Gastrointestinal: Positive for diarrhea. Negative for abdominal pain, constipation, nausea and vomiting.  Genitourinary: Negative for dysuria.  Musculoskeletal: Negative for joint pain.  Neurological: Negative for dizziness and headaches.   Exam: Physical Exam  Constitutional: He is oriented to person, place, and time.  HENT:  Nose: No mucosal edema.  Mouth/Throat: No oropharyngeal exudate or posterior oropharyngeal edema.  Eyes: Conjunctivae, EOM and lids are normal. Pupils are equal, round, and reactive to light.  Neck: No JVD present. Carotid bruit is not present. No edema present. No thyroid mass and no thyromegaly present.  Cardiovascular: S1 normal and S2 normal.  An irregularly irregular rhythm present. Exam reveals no gallop.   No murmur heard. Pulses:      Dorsalis pedis pulses are 2+ on the right side, and 2+ on the left side.  Respiratory: No respiratory distress. He has decreased breath sounds in the right lower field. He has no wheezes. He has no rhonchi. He has no rales.  GI: Soft. Bowel sounds are normal. He exhibits  distension and fluid wave. There is no tenderness.  Genitourinary:  Genitourinary Comments: 4+ scrotal and penis swelling  Musculoskeletal:       Right ankle: He exhibits swelling.       Left ankle: He exhibits swelling.  Lymphadenopathy:    He has no cervical adenopathy.  Neurological: He is alert and oriented to person, place, and time. No cranial nerve deficit.  Skin: Skin is warm. Nails show no clubbing.  As per nursing staff deep sacral decubiti  Psychiatric: He has a normal mood and affect.      Data Reviewed: Basic Metabolic Panel:  Recent Labs Lab 12/27/16 1131 12/28/16 0555 12/29/16 0532 12/30/16 0456 12/31/16 0526 01/01/17 1229  NA 129* 132* 131* 131* 129* 127*  K 4.2 4.2 4.1 3.8 4.0 4.4  CL 97* 100* 99* 99* 96* 95*  CO2 24 25 27 27 28 26   GLUCOSE 175* 161* 141* 159* 147* 164*  BUN 37* 38* 34* 32* 30* 33*  CREATININE 1.45* 1.47* 1.33* 1.29* 1.16 1.24  CALCIUM 7.7* 7.9* 8.2* 7.8* 8.0* 8.0*  MG 1.1* 1.6* 1.7 1.8  --  1.7  PHOS  --  3.6  --   --   --   --    Liver Function Tests:  Recent Labs Lab 12/26/16 2351  AST 54*  ALT 27  ALKPHOS 153*  BILITOT 2.0*  PROT 7.2  ALBUMIN 2.2*    Recent Labs Lab 12/30/16 1734  AMMONIA 44*   CBC:  Recent Labs Lab 12/26/16 2313 12/27/16 1131 12/28/16 0555  12/29/16 0532 12/30/16 0456 12/31/16 0526  WBC 27.6* 25.9* 24.4* 18.7* 17.2* 14.0*  NEUTROABS 26.3*  --   --   --   --   --   HGB 10.6* 8.3* 9.1* 8.4* 8.8* 9.4*  HCT 31.2* 24.1* 26.6* 25.3* 25.5* 26.9*  MCV 100.8* 99.7 100.5* 101.9* 101.3* 99.1  PLT 258 173 176 116* 108* 120*    CBG:  Recent Labs Lab 12/31/16 1127 12/31/16 1620 12/31/16 2139 01/01/17 0815 01/01/17 1202  GLUCAP 104* 100* 157* 156* 136*    Recent Results (from the past 240 hour(s))  Culture, blood (routine x 2)     Status: Abnormal (Preliminary result)   Collection Time: 12/27/16 12:40 AM  Result Value Ref Range Status   Specimen Description BLOOD RIGHT HAND  Final    Special Requests   Final    BOTTLES DRAWN AEROBIC AND ANAEROBIC AERO5ML,ANAERO4ML   Culture  Setup Time   Final    GRAM POSITIVE RODS ANAEROBIC BOTTLE ONLY CRITICAL VALUE NOTED.  VALUE IS CONSISTENT WITH PREVIOUSLY REPORTED AND CALLED VALUE.    Culture (A)  Final    DIPHTHEROIDS(CORYNEBACTERIUM SPECIES) Standardized susceptibility testing for this organism is not available. Performed at [facility], [place] [address] [place], [place] [address]    Report Status PENDING  Incomplete  Culture, blood (routine x 2)     Status: Abnormal (Preliminary result)   Collection Time: 12/27/16 12:40 AM  Result Value Ref Range Status   Specimen Description BLOOD LEFT HAND  Final   Special Requests   Final    BOTTLES DRAWN AEROBIC AND ANAEROBIC AERO3ML,ANAERO10ML   Culture  Setup Time   Final    GRAM POSITIVE RODS ANAEROBIC BOTTLE ONLY CRITICAL RESULT CALLED TO, READ BACK BY AND VERIFIED WITH: HANK ZOMPA 12/29/16 0900 SGD    Culture (A)  Final    ANAEROBIC GRAM POSITIVE RODS IDENTIFICATION TO FOLLOW Performed at [facility], [place] [address], [place],  [address]    Report Status PENDING  Incomplete  Blood Culture ID Panel (Reflexed)     Status: None   Collection Time: 12/27/16 12:40 AM  Result Value Ref Range Status   Enterococcus species NOT DETECTED NOT DETECTED Final   Listeria monocytogenes NOT DETECTED NOT DETECTED Final   Staphylococcus species NOT DETECTED NOT DETECTED Final   Staphylococcus aureus NOT DETECTED NOT DETECTED Final   Streptococcus species NOT DETECTED NOT DETECTED Final   Streptococcus agalactiae NOT DETECTED NOT DETECTED Final   Streptococcus pneumoniae NOT DETECTED NOT DETECTED Final   Streptococcus pyogenes NOT DETECTED NOT DETECTED Final   Acinetobacter baumannii NOT DETECTED NOT DETECTED Final   Enterobacteriaceae species NOT DETECTED NOT DETECTED Final   Enterobacter cloacae complex NOT DETECTED NOT DETECTED Final   Escherichia coli NOT DETECTED  NOT DETECTED Final   Klebsiella oxytoca NOT DETECTED NOT DETECTED Final   Klebsiella pneumoniae NOT DETECTED NOT DETECTED Final   Proteus species NOT DETECTED NOT DETECTED Final   Serratia marcescens NOT DETECTED NOT DETECTED Final   Haemophilus influenzae NOT DETECTED NOT DETECTED Final   Neisseria meningitidis NOT DETECTED NOT DETECTED Final   Pseudomonas aeruginosa NOT DETECTED NOT DETECTED Final   Candida albicans NOT DETECTED NOT DETECTED Final   Candida glabrata NOT DETECTED NOT DETECTED Final   Candida krusei NOT DETECTED NOT DETECTED Final   Candida parapsilosis NOT DETECTED NOT DETECTED Final   Candida tropicalis NOT DETECTED NOT DETECTED Final  MRSA PCR Screening     Status:  None   Collection Time: 12/27/16  6:30 AM  Result Value Ref Range Status   MRSA by PCR NEGATIVE NEGATIVE Final    Comment:        The GeneXpert MRSA Assay (FDA approved for NASAL specimens only), is one component of a comprehensive MRSA colonization surveillance program. It is not intended to diagnose MRSA infection nor to guide or monitor treatment for MRSA infections.   Urine culture     Status: None   Collection Time: 12/27/16 11:32 AM  Result Value Ref Range Status   Specimen Description URINE, CLEAN CATCH  Final   Special Requests NONE  Final   Culture   Final    NO GROWTH Performed at [facility], [address] [place], [place] [address]    Report Status 12/28/2016 FINAL  Final      Scheduled Meds: . ammonium lactate   Topical Daily  . diphenhydrAMINE  12.5 mg Intravenous Once  . folic acid  1 mg Oral Daily  . furosemide  40 mg Oral Daily  . insulin aspart  2-6 Units Subcutaneous TID AC & HS  . magnesium sulfate 1 - 4 g bolus IVPB  2 g Intravenous Once  . magnesium sulfate 1 - 4 g bolus IVPB  2 g Intravenous Once  . piperacillin-tazobactam (ZOSYN)  IV  4.5 g Intravenous Q8H  . pneumococcal 23 valent vaccine  0.5 mL Intramuscular Tomorrow-1000  . potassium chloride  20  mEq Oral Daily  . protein supplement shake  11 oz Oral BID BM  . sodium chloride flush  3 mL Intravenous Q12H  . sodium hypochlorite   Irrigation BID  . sotalol  80 mg Oral BID   Continuous Infusions:  Assessment/Plan:   1. Hemorrhagic shock due to bleeding sacral ulcer while on anticoagulation. Acute hemorrhagic anemia. Blood pressure and hemoglobin improved. 2. Infected decubitus ulcer. On Zosyn. Still waiting for final results of blood cultures. I think the carina bacterium in the blood culture is a skin contaminant. Check ESR. ID consultation Monday 3. Acute kidney injury on chronic kidney disease stage II continue to monitor. 4. Hyponatremia and anasarca. Give 1 dose of Tolvaptan today. 5. Chronic atrial fibrillation on sotalol. Hold anticoagulation secondary to bleeding 6. History of chronic diastolic congestive heart failure. Hopefully can start Lasix tomorrow. 7. Alcoholic liver cirrhosis with ascites. Likely will end up needing another paracentesis while here 8. Weakness. Physical therapy recommends rehabilitation  Code Status:     Code Status Orders        Start     Ordered   12/27/16 9767  Full code  Continuous     12/27/16 0632    Code Status History    Date Active Date Inactive Code Status Order ID Comments User Context   12/15/2016  8:06 PM 12/21/2016  4:29 PM Full Code [id number]  Vaughan Basta, MD ED   04/17/2016  7:26 PM 04/19/2016  5:50 PM Full Code [id number]  Lytle Butte, MD ED     Family Communication: Family at the bedside Disposition Plan: Hopefully will except rehabilitation  Consultants:  Psychiatry  Gen. Surgery  Gastroenterology  Antibiotics:  Zosyn  Time spent: 28 minutes  Loletha Grayer  [employer]

## 2017-01-01 NOTE — Plan of Care (Signed)
Problem: Skin Integrity: Goal: Risk for impaired skin integrity will decrease Outcome: Progressing Patient is getting daily dressing changes bid.

## 2017-01-01 NOTE — Progress Notes (Addendum)
MEDICATION RELATED CONSULT NOTE  Pharmacy Consult for piperacillin/tazobactam and electrolyte monitoring/replacement Indication: sepsis possibly from sacral area  Assessment: 55yoM on home apixaban, who presents with sacral bleeding given KCENTRA. Pt found to be septic. Pharmacy consulted for piperacillin/tazobactam dosing. Patient receiving potassium PO daily.   Plan:  1. Day 6 of piperacillin/tazobactam 4.5g IV q8h (patient wt= 145.5 kg). (started 12/27/16).  Per Dr. Imogene Burn note 12/31/16:according to surgeon, no infection. DC abx (on zosyn) after 5 day course per Dr. Linward Natal.  1/20: Rounding MD today wants to continue Zosyn at this time.   2. Electrolytes: K=4.4,  Mag= 1.7. Will give Magnesium 2 gram IV x 1. Patient already on KCL PO 20 MEQ Daily and furosemide 40 po Daily  Will follow up on AM labs. Plan to recheck Mag in 2 days.   Allergies  Allergen Reactions  . Clindamycin/Lincomycin Diarrhea    Patient Measurements: Height: 6\' 4"  (193 cm) Weight: (!) 328 lb (148.8 kg) IBW/kg (Calculated) : 86.8  Vital Signs: Temp: 98 F (36.7 C) (01/20 0817) Temp Source: Oral (01/20 0817) BP: 119/77 (01/20 0817) Pulse Rate: 87 (01/20 0817) Intake/Output from previous day: 01/19 0701 - 01/20 0700 In: 480 [P.O.:480] Out: 125 [Urine:125] Intake/Output from this shift: No intake/output data recorded.  Labs:  Recent Labs  12/30/16 0456 12/31/16 0526  WBC 17.2* 14.0*  HGB 8.8* 9.4*  HCT 25.5* 26.9*  PLT 108* 120*  CREATININE 1.29* 1.16  MG 1.8  --    Estimated Creatinine Clearance: 112.2 mL/min (by C-G formula based on SCr of 1.16 mg/dL).   Microbiology: Recent Results (from the past 720 hour(s))  Body fluid culture     Status: None   Collection Time: 12/17/16  1:15 PM  Result Value Ref Range Status   Specimen Description PERITONEAL  Final   Special Requests NONE  Final   Gram Stain   Final    FEW WBC PRESENT,BOTH PMN AND MONONUCLEAR NO ORGANISMS SEEN    Culture   Final    No growth aerobically or anaerobically. Performed at [facility]    Report Status 12/20/2016 FINAL  Final  Culture, blood (routine x 2)     Status: None (Preliminary result)   Collection Time: 12/27/16 12:40 AM  Result Value Ref Range Status   Specimen Description BLOOD RIGHT HAND  Final   Special Requests   Final    BOTTLES DRAWN AEROBIC AND ANAEROBIC AERO5ML,ANAERO4ML   Culture  Setup Time   Final    GRAM POSITIVE RODS ANAEROBIC BOTTLE ONLY CRITICAL VALUE NOTED.  VALUE IS CONSISTENT WITH PREVIOUSLY REPORTED AND CALLED VALUE.    Culture GRAM POSITIVE RODS  Final   Report Status PENDING  Incomplete  Culture, blood (routine x 2)     Status: Abnormal (Preliminary result)   Collection Time: 12/27/16 12:40 AM  Result Value Ref Range Status   Specimen Description BLOOD LEFT HAND  Final   Special Requests   Final    BOTTLES DRAWN AEROBIC AND ANAEROBIC AERO3ML,ANAERO10ML   Culture  Setup Time   Final    GRAM POSITIVE RODS ANAEROBIC BOTTLE ONLY CRITICAL RESULT CALLED TO, READ BACK BY AND VERIFIED WITH: HANK ZOMPA 12/29/16 0900 SGD    Culture (A)  Final    ANAEROBIC GRAM POSITIVE RODS IDENTIFICATION TO FOLLOW Performed at [facility], [address] [place], [place] [address]    Report Status PENDING  Incomplete  Blood Culture ID Panel (Reflexed)     Status: None  Collection Time: 12/27/16 12:40 AM  Result Value Ref Range Status   Enterococcus species NOT DETECTED NOT DETECTED Final   Listeria monocytogenes NOT DETECTED NOT DETECTED Final   Staphylococcus species NOT DETECTED NOT DETECTED Final   Staphylococcus aureus NOT DETECTED NOT DETECTED Final   Streptococcus species NOT DETECTED NOT DETECTED Final   Streptococcus agalactiae NOT DETECTED NOT DETECTED Final   Streptococcus pneumoniae NOT DETECTED NOT DETECTED Final   Streptococcus pyogenes NOT DETECTED NOT DETECTED Final   Acinetobacter baumannii NOT DETECTED NOT DETECTED Final    Enterobacteriaceae species NOT DETECTED NOT DETECTED Final   Enterobacter cloacae complex NOT DETECTED NOT DETECTED Final   Escherichia coli NOT DETECTED NOT DETECTED Final   Klebsiella oxytoca NOT DETECTED NOT DETECTED Final   Klebsiella pneumoniae NOT DETECTED NOT DETECTED Final   Proteus species NOT DETECTED NOT DETECTED Final   Serratia marcescens NOT DETECTED NOT DETECTED Final   Haemophilus influenzae NOT DETECTED NOT DETECTED Final   Neisseria meningitidis NOT DETECTED NOT DETECTED Final   Pseudomonas aeruginosa NOT DETECTED NOT DETECTED Final   Candida albicans NOT DETECTED NOT DETECTED Final   Candida glabrata NOT DETECTED NOT DETECTED Final   Candida krusei NOT DETECTED NOT DETECTED Final   Candida parapsilosis NOT DETECTED NOT DETECTED Final   Candida tropicalis NOT DETECTED NOT DETECTED Final  MRSA PCR Screening     Status: None   Collection Time: 12/27/16  6:30 AM  Result Value Ref Range Status   MRSA by PCR NEGATIVE NEGATIVE Final    Comment:        The GeneXpert MRSA Assay (FDA approved for NASAL specimens only), is one component of a comprehensive MRSA colonization surveillance program. It is not intended to diagnose MRSA infection nor to guide or monitor treatment for MRSA infections.   Urine culture     Status: None   Collection Time: 12/27/16 11:32 AM  Result Value Ref Range Status   Specimen Description URINE, CLEAN CATCH  Final   Special Requests NONE  Final   Culture   Final    NO GROWTH Performed at [facility], [address] [place], [place] [address]    Report Status 12/28/2016 FINAL  Final    Antibiotics: Cipro 1/15 >>1/15 Vancomycin 1/15 >>1/15 Piperacillin/tazo 1/15 >>  Cultures: 1/15 BCx: 1/4 bottles + anaerobic GPR 1/15 BCx: NGTD 1/15 MRSA PCR: negative  1/15 UCx: NG  Bari MantisKristin Elaria Osias PharmD Clinical Pharmacist 01/01/2017

## 2017-01-01 NOTE — Progress Notes (Signed)
  PHARMACY - PHYSICIAN COMMUNICATION CRITICAL VALUE ALERT - BLOOD CULTURE IDENTIFICATION (BCID)  Results for orders placed or performed during the hospital encounter of 12/26/16  Blood Culture ID Panel (Reflexed) (Collected: 12/27/2016 12:40 AM)  Result Value Ref Range   Enterococcus species NOT DETECTED NOT DETECTED   Listeria monocytogenes NOT DETECTED NOT DETECTED   Staphylococcus species NOT DETECTED NOT DETECTED   Staphylococcus aureus NOT DETECTED NOT DETECTED   Streptococcus species NOT DETECTED NOT DETECTED   Streptococcus agalactiae NOT DETECTED NOT DETECTED   Streptococcus pneumoniae NOT DETECTED NOT DETECTED   Streptococcus pyogenes NOT DETECTED NOT DETECTED   Acinetobacter baumannii NOT DETECTED NOT DETECTED   Enterobacteriaceae species NOT DETECTED NOT DETECTED   Enterobacter cloacae complex NOT DETECTED NOT DETECTED   Escherichia coli NOT DETECTED NOT DETECTED   Klebsiella oxytoca NOT DETECTED NOT DETECTED   Klebsiella pneumoniae NOT DETECTED NOT DETECTED   Proteus species NOT DETECTED NOT DETECTED   Serratia marcescens NOT DETECTED NOT DETECTED   Haemophilus influenzae NOT DETECTED NOT DETECTED   Neisseria meningitidis NOT DETECTED NOT DETECTED   Pseudomonas aeruginosa NOT DETECTED NOT DETECTED   Candida albicans NOT DETECTED NOT DETECTED   Candida glabrata NOT DETECTED NOT DETECTED   Candida krusei NOT DETECTED NOT DETECTED   Candida parapsilosis NOT DETECTED NOT DETECTED   Candida tropicalis NOT DETECTED NOT DETECTED    Name of physician (or Provider) Contacted: [name]  Assessment Patient growing anaerobic GPR, thought to be contamination. Abx switched from PCN G to Zosyn/Dapto for sepsis coverage.  Changes to prescribed antibiotics required: Zosyn >> PCN G >> Zosyn/Dapto (current)  [name] RippleDavid  [name] 01/01/2017  3:14 PM

## 2017-01-02 LAB — CBC
HEMATOCRIT: 27.7 % — AB (ref 40.0–52.0)
HEMOGLOBIN: 9.4 g/dL — AB (ref 13.0–18.0)
MCH: 34.1 pg — AB (ref 26.0–34.0)
MCHC: 34 g/dL (ref 32.0–36.0)
MCV: 100.2 fL — ABNORMAL HIGH (ref 80.0–100.0)
Platelets: 153 10*3/uL (ref 150–440)
RBC: 2.76 MIL/uL — AB (ref 4.40–5.90)
RDW: 17.3 % — ABNORMAL HIGH (ref 11.5–14.5)
WBC: 13.1 10*3/uL — ABNORMAL HIGH (ref 3.8–10.6)

## 2017-01-02 LAB — GLUCOSE, CAPILLARY
Glucose-Capillary: 129 mg/dL — ABNORMAL HIGH (ref 65–99)
Glucose-Capillary: 128 mg/dL — ABNORMAL HIGH (ref 65–99)
Glucose-Capillary: 128 mg/dL — ABNORMAL HIGH (ref 65–99)
Glucose-Capillary: 135 mg/dL — ABNORMAL HIGH (ref 65–99)
Glucose-Capillary: 143 mg/dL — ABNORMAL HIGH (ref 65–99)

## 2017-01-02 LAB — CULTURE, BLOOD (ROUTINE X 2)

## 2017-01-02 LAB — MAGNESIUM: MAGNESIUM: 1.8 mg/dL (ref 1.7–2.4)

## 2017-01-02 LAB — BASIC METABOLIC PANEL
ANION GAP: 5 (ref 5–15)
BUN: 32 mg/dL — ABNORMAL HIGH (ref 6–20)
CALCIUM: 8 mg/dL — AB (ref 8.9–10.3)
CO2: 29 mmol/L (ref 22–32)
Chloride: 95 mmol/L — ABNORMAL LOW (ref 101–111)
Creatinine, Ser: 1.11 mg/dL (ref 0.61–1.24)
GFR calc non Af Amer: >60 mL/min (ref >60)
Glucose, Bld: 127 mg/dL — ABNORMAL HIGH (ref 65–99)
POTASSIUM: 4.2 mmol/L (ref 3.5–5.1)
Sodium: 129 mmol/L — ABNORMAL LOW (ref 135–145)

## 2017-01-02 LAB — ALBUMIN: ALBUMIN: 1.7 g/dL — AB (ref 3.5–5.0)

## 2017-01-02 LAB — SEDIMENTATION RATE: Sed Rate: 73 mm/hr — ABNORMAL HIGH (ref 0–20)

## 2017-01-02 MED ORDER — FUROSEMIDE 10 MG/ML IJ SOLN
40.0000 mg | Freq: Every day | INTRAMUSCULAR | Status: DC
  Administered 2017-01-02 – 2017-01-06 (×4): 40 mg via INTRAVENOUS
  Filled 2017-01-02 (×6): qty 4

## 2017-01-02 NOTE — Progress Notes (Signed)
MEDICATION RELATED CONSULT NOTE  Pharmacy Consult for piperacillin/tazobactam and electrolyte monitoring/replacement Indication: sepsis possibly from Infected decubitus ulcer.  Assessment: 55yoM on home apixaban, who presents with sacral bleeding given KCENTRA. Pt found to be septic. Pharmacy consulted for piperacillin/tazobactam dosing. Patient receiving potassium PO daily.   Plan:  1. Day 7 of piperacillin/tazobactam 4.5g IV q8h (patient wt= 147 kg). (started 12/27/16).  Per Dr. Imogene Burn note 12/31/16:(according to surgeon, no infection. DC abx (on zosyn) after 5 day course per Dr. Linward Natal.)  1/20: Rounding MD today wants to continue Zosyn at this time. MD aware of Blood cx with Diptheroids(corynebacterium)- per MD await further Blood cx to determine if contaminant.    2. Electrolytes: K=4.2,  Mag= 1.8. Patient already on KCL PO 20 MEQ Daily and furosemide 40 IV Daily  Will follow up on AM labs.     Allergies  Allergen Reactions  . Clindamycin/Lincomycin Diarrhea    Patient Measurements: Height: 6\' 4"  (193 cm) Weight: (!) 325 lb (147.4 kg) IBW/kg (Calculated) : 86.8  Vital Signs: Temp: 97.9 F (36.6 C) (01/21 0751) Temp Source: Oral (01/21 0751) BP: 126/81 (01/21 0751) Pulse Rate: 87 (01/21 0751) Intake/Output from previous day: 01/20 0701 - 01/21 0700 In: 1210 [P.O.:960; IV Piggyback:250] Out: 300 [Urine:300] Intake/Output from this shift: No intake/output data recorded.  Labs:  Recent Labs  12/31/16 0526 01/01/17 1229 01/02/17 0642  WBC 14.0*  --  13.1*  HGB 9.4*  --  9.4*  HCT 26.9*  --  27.7*  PLT 120*  --  153  CREATININE 1.16 1.24 1.11  MG  --  1.7 1.8  ALBUMIN  --   --  1.7*   Estimated Creatinine Clearance: 116.7 mL/min (by C-G formula based on SCr of 1.11 mg/dL).   Microbiology: Recent Results (from the past 720 hour(s))  Body fluid culture     Status: None   Collection Time: 12/17/16  1:15 PM  Result Value Ref Range Status   Specimen  Description PERITONEAL  Final   Special Requests NONE  Final   Gram Stain   Final    FEW WBC PRESENT,BOTH PMN AND MONONUCLEAR NO ORGANISMS SEEN    Culture   Final    No growth aerobically or anaerobically. Performed at [facility]    Report Status 12/20/2016 FINAL  Final  Culture, blood (routine x 2)     Status: Abnormal (Preliminary result)   Collection Time: 12/27/16 12:40 AM  Result Value Ref Range Status   Specimen Description BLOOD RIGHT HAND  Final   Special Requests   Final    BOTTLES DRAWN AEROBIC AND ANAEROBIC AERO5ML,ANAERO4ML   Culture  Setup Time   Final    GRAM POSITIVE RODS ANAEROBIC BOTTLE ONLY CRITICAL VALUE NOTED.  VALUE IS CONSISTENT WITH PREVIOUSLY REPORTED AND CALLED VALUE.    Culture (A)  Final    DIPHTHEROIDS(CORYNEBACTERIUM SPECIES) Standardized susceptibility testing for this organism is not available. Performed at [facility], [address], [place], [place] [address]    Report Status PENDING  Incomplete  Culture, blood (routine x 2)     Status: Abnormal (Preliminary result)   Collection Time: 12/27/16 12:40 AM  Result Value Ref Range Status   Specimen Description BLOOD LEFT HAND  Final   Special Requests   Final    BOTTLES DRAWN AEROBIC AND ANAEROBIC AERO3ML,ANAERO10ML   Culture  Setup Time   Final    GRAM POSITIVE RODS ANAEROBIC BOTTLE ONLY CRITICAL RESULT CALLED TO, READ  BACK BY AND VERIFIED WITH: HANK ZOMPA 12/29/16 0900 SGD    Culture (A)  Final    ANAEROBIC GRAM POSITIVE RODS IDENTIFICATION TO FOLLOW Performed at [facility], [address] [place], [place] [address]    Report Status PENDING  Incomplete  Blood Culture ID Panel (Reflexed)     Status: None   Collection Time: 12/27/16 12:40 AM  Result Value Ref Range Status   Enterococcus species NOT DETECTED NOT DETECTED Final   Listeria monocytogenes NOT DETECTED NOT DETECTED Final   Staphylococcus species NOT DETECTED NOT DETECTED Final   Staphylococcus aureus  NOT DETECTED NOT DETECTED Final   Streptococcus species NOT DETECTED NOT DETECTED Final   Streptococcus agalactiae NOT DETECTED NOT DETECTED Final   Streptococcus pneumoniae NOT DETECTED NOT DETECTED Final   Streptococcus pyogenes NOT DETECTED NOT DETECTED Final   Acinetobacter baumannii NOT DETECTED NOT DETECTED Final   Enterobacteriaceae species NOT DETECTED NOT DETECTED Final   Enterobacter cloacae complex NOT DETECTED NOT DETECTED Final   Escherichia coli NOT DETECTED NOT DETECTED Final   Klebsiella oxytoca NOT DETECTED NOT DETECTED Final   Klebsiella pneumoniae NOT DETECTED NOT DETECTED Final   Proteus species NOT DETECTED NOT DETECTED Final   Serratia marcescens NOT DETECTED NOT DETECTED Final   Haemophilus influenzae NOT DETECTED NOT DETECTED Final   Neisseria meningitidis NOT DETECTED NOT DETECTED Final   Pseudomonas aeruginosa NOT DETECTED NOT DETECTED Final   Candida albicans NOT DETECTED NOT DETECTED Final   Candida glabrata NOT DETECTED NOT DETECTED Final   Candida krusei NOT DETECTED NOT DETECTED Final   Candida parapsilosis NOT DETECTED NOT DETECTED Final   Candida tropicalis NOT DETECTED NOT DETECTED Final  MRSA PCR Screening     Status: None   Collection Time: 12/27/16  6:30 AM  Result Value Ref Range Status   MRSA by PCR NEGATIVE NEGATIVE Final    Comment:        The GeneXpert MRSA Assay (FDA approved for NASAL specimens only), is one component of a comprehensive MRSA colonization surveillance program. It is not intended to diagnose MRSA infection nor to guide or monitor treatment for MRSA infections.   Urine culture     Status: None   Collection Time: 12/27/16 11:32 AM  Result Value Ref Range Status   Specimen Description URINE, CLEAN CATCH  Final   Special Requests NONE  Final   Culture   Final    NO GROWTH Performed at [facility], [address] [place], [place] [address]    Report Status 12/28/2016 FINAL  Final    Antibiotics: Cipro 1/15  >>1/15 Vancomycin 1/15 >>1/15 Piperacillin/tazo 1/15 >>  Cultures: 1/15 BCx: 1/4 bottles + anaerobic GPR= DIPHTHEROIDS(CORYNEBACTERIUM SPECIES)  1/15 BCx: NGTD 1/15 MRSA PCR: negative  1/15 UCx: NG  Bari MantisKristin Kinleigh Nault PharmD Clinical Pharmacist 01/02/2017

## 2017-01-02 NOTE — Progress Notes (Signed)
Patient ID: [name], male   DOB: [birth year], 56 y.o.   MRN: [id number]  [employer] PROGRESS NOTE  [name] RZN:[id number] DOB: 16-Sep-1961 DOA: 12/26/2016 PCP: Cletis Athens, MD  HPI/Subjective: Patient still not feeling well. Feels weak. Does not elaborate much. States he still having diarrhea.  Objective: Vitals:   01/02/17 0420 01/02/17 0751  BP: 107/80 126/81  Pulse: 90 87  Resp: 20 17  Temp: 97.4 F (36.3 C) 97.9 F (36.6 C)    Filed Weights   12/31/16 0455 01/01/17 0500 01/02/17 0500  Weight: (!) 148.8 kg (328 lb) (!) 148.8 kg (328 lb) (!) 147.4 kg (325 lb)    ROS: Review of Systems  Constitutional: Negative for chills and fever.  Eyes: Negative for blurred vision.  Respiratory: Negative for cough and shortness of breath.   Cardiovascular: Negative for chest pain.  Gastrointestinal: Positive for diarrhea. Negative for abdominal pain, constipation, nausea and vomiting.  Genitourinary: Negative for dysuria.  Musculoskeletal: Negative for joint pain.  Neurological: Negative for dizziness and headaches.   Exam: Physical Exam  Constitutional: He is oriented to person, place, and time.  HENT:  Nose: No mucosal edema.  Mouth/Throat: No oropharyngeal exudate or posterior oropharyngeal edema.  Eyes: Conjunctivae, EOM and lids are normal. Pupils are equal, round, and reactive to light.  Neck: No JVD present. Carotid bruit is not present. No edema present. No thyroid mass and no thyromegaly present.  Cardiovascular: S1 normal and S2 normal.  An irregularly irregular rhythm present. Exam reveals no gallop.   No murmur heard. Pulses:      Dorsalis pedis pulses are 2+ on the right side, and 2+ on the left side.  Respiratory: No respiratory distress. He has decreased breath sounds in the right lower field. He has no wheezes. He has no rhonchi. He has no rales.  GI: Soft. Bowel sounds are normal. He exhibits distension and fluid wave. There is no tenderness.   Genitourinary:  Genitourinary Comments: 4+ scrotal and penis swelling  Musculoskeletal:       Right ankle: He exhibits swelling.       Left ankle: He exhibits swelling.  Lymphadenopathy:    He has no cervical adenopathy.  Neurological: He is alert and oriented to person, place, and time. No cranial nerve deficit.  Skin: Skin is warm. Nails show no clubbing.  Large sacral decubitus requiring packing with some necrotic material surrounding. Patient has 2 superficial areas of skin breakdown surrounding this.  Psychiatric: He has a normal mood and affect.      Data Reviewed: Basic Metabolic Panel:  Recent Labs Lab 12/28/16 0555 12/29/16 0532 12/30/16 0456 12/31/16 0526 01/01/17 1229 01/02/17 0642  NA 132* 131* 131* 129* 127* 129*  K 4.2 4.1 3.8 4.0 4.4 4.2  CL 100* 99* 99* 96* 95* 95*  CO2 25 27 27 28 26 29   GLUCOSE 161* 141* 159* 147* 164* 127*  BUN 38* 34* 32* 30* 33* 32*  CREATININE 1.47* 1.33* 1.29* 1.16 1.24 1.11  CALCIUM 7.9* 8.2* 7.8* 8.0* 8.0* 8.0*  MG 1.6* 1.7 1.8  --  1.7 1.8  PHOS 3.6  --   --   --   --   --    Liver Function Tests:  Recent Labs Lab 12/26/16 2351 01/02/17 0642  AST 54*  --   ALT 27  --   ALKPHOS 153*  --   BILITOT 2.0*  --   PROT 7.2  --   ALBUMIN 2.2* 1.7*  Recent Labs Lab 12/30/16 1734  AMMONIA 44*   CBC:  Recent Labs Lab 12/26/16 2313  12/28/16 0555 12/29/16 0532 12/30/16 0456 12/31/16 0526 01/02/17 0642  WBC 27.6*  < > 24.4* 18.7* 17.2* 14.0* 13.1*  NEUTROABS 26.3*  --   --   --   --   --   --   HGB 10.6*  < > 9.1* 8.4* 8.8* 9.4* 9.4*  HCT 31.2*  < > 26.6* 25.3* 25.5* 26.9* 27.7*  MCV 100.8*  < > 100.5* 101.9* 101.3* 99.1 100.2*  PLT 258  < > 176 116* 108* 120* 153  < > = values in this interval not displayed.  CBG:  Recent Labs Lab 01/01/17 1202 01/01/17 1629 01/01/17 2101 01/02/17 0747 01/02/17 1140  GLUCAP 136* 131* 118* 129* 128*    Recent Results (from the past 240 hour(s))  Culture, blood  (routine x 2)     Status: Abnormal (Preliminary result)   Collection Time: 12/27/16 12:40 AM  Result Value Ref Range Status   Specimen Description BLOOD RIGHT HAND  Final   Special Requests   Final    BOTTLES DRAWN AEROBIC AND ANAEROBIC AERO5ML,ANAERO4ML   Culture  Setup Time   Final    GRAM POSITIVE RODS ANAEROBIC BOTTLE ONLY CRITICAL VALUE NOTED.  VALUE IS CONSISTENT WITH PREVIOUSLY REPORTED AND CALLED VALUE.    Culture (A)  Final    DIPHTHEROIDS(CORYNEBACTERIUM SPECIES) Standardized susceptibility testing for this organism is not available. Performed at [facility], [place] [address], [place], [place] [address]    Report Status PENDING  Incomplete  Culture, blood (routine x 2)     Status: Abnormal   Collection Time: 12/27/16 12:40 AM  Result Value Ref Range Status   Specimen Description BLOOD LEFT HAND  Final   Special Requests   Final    BOTTLES DRAWN AEROBIC AND ANAEROBIC AERO3ML,ANAERO10ML   Culture  Setup Time   Final    GRAM POSITIVE RODS ANAEROBIC BOTTLE ONLY CRITICAL RESULT CALLED TO, READ BACK BY AND VERIFIED WITH: HANK ZOMPA 12/29/16 0900 SGD    Culture (A)  Final    ANAEROBIC GRAM POSITIVE RODS UNABLE TO FURTHER IDENTIFY. Performed at [facility], [place] [address] [place], [place] [address]    Report Status 01/02/2017 FINAL  Final  Blood Culture ID Panel (Reflexed)     Status: None   Collection Time: 12/27/16 12:40 AM  Result Value Ref Range Status   Enterococcus species NOT DETECTED NOT DETECTED Final   Listeria monocytogenes NOT DETECTED NOT DETECTED Final   Staphylococcus species NOT DETECTED NOT DETECTED Final   Staphylococcus aureus NOT DETECTED NOT DETECTED Final   Streptococcus species NOT DETECTED NOT DETECTED Final   Streptococcus agalactiae NOT DETECTED NOT DETECTED Final   Streptococcus pneumoniae NOT DETECTED NOT DETECTED Final   Streptococcus pyogenes NOT DETECTED NOT DETECTED Final   Acinetobacter baumannii NOT DETECTED NOT DETECTED  Final   Enterobacteriaceae species NOT DETECTED NOT DETECTED Final   Enterobacter cloacae complex NOT DETECTED NOT DETECTED Final   Escherichia coli NOT DETECTED NOT DETECTED Final   Klebsiella oxytoca NOT DETECTED NOT DETECTED Final   Klebsiella pneumoniae NOT DETECTED NOT DETECTED Final   Proteus species NOT DETECTED NOT DETECTED Final   Serratia marcescens NOT DETECTED NOT DETECTED Final   Haemophilus influenzae NOT DETECTED NOT DETECTED Final   Neisseria meningitidis NOT DETECTED NOT DETECTED Final   Pseudomonas aeruginosa NOT DETECTED NOT DETECTED Final   Candida albicans NOT DETECTED  NOT DETECTED Final   Candida glabrata NOT DETECTED NOT DETECTED Final   Candida krusei NOT DETECTED NOT DETECTED Final   Candida parapsilosis NOT DETECTED NOT DETECTED Final   Candida tropicalis NOT DETECTED NOT DETECTED Final  MRSA PCR Screening     Status: None   Collection Time: 12/27/16  6:30 AM  Result Value Ref Range Status   MRSA by PCR NEGATIVE NEGATIVE Final    Comment:        The GeneXpert MRSA Assay (FDA approved for NASAL specimens only), is one component of a comprehensive MRSA colonization surveillance program. It is not intended to diagnose MRSA infection nor to guide or monitor treatment for MRSA infections.   Urine culture     Status: None   Collection Time: 12/27/16 11:32 AM  Result Value Ref Range Status   Specimen Description URINE, CLEAN CATCH  Final   Special Requests NONE  Final   Culture   Final    NO GROWTH Performed at [facility], [address], [place], [place] [address]    Report Status 12/28/2016 FINAL  Final      Scheduled Meds: . ammonium lactate   Topical Daily  . diphenhydrAMINE  12.5 mg Intravenous Once  . folic acid  1 mg Oral Daily  . furosemide  40 mg Intravenous Daily  . insulin aspart  2-6 Units Subcutaneous TID AC & HS  . piperacillin-tazobactam (ZOSYN)  IV  4.5 g Intravenous Q8H  . pneumococcal 23 valent vaccine  0.5 mL  Intramuscular Tomorrow-1000  . potassium chloride  20 mEq Oral Daily  . protein supplement shake  11 oz Oral BID BM  . sodium chloride flush  3 mL Intravenous Q12H  . sotalol  80 mg Oral BID   Continuous Infusions:  Assessment/Plan:   1. Hemorrhagic shock due to bleeding sacral ulcer while on anticoagulation. Acute hemorrhagic anemia. Blood pressure and hemoglobin improved. 2. Infected decubitus ulcer Which is at least a stage III. On Zosyn. I still think the anaerobic gram-positive Rod is a contamination from the blood cultures. Repeat blood cultures today.  ESR is high. ID consultation Monday. Patient will likely need longer term antibiotics. 3. Acute kidney injury on chronic kidney disease stage II continue to monitor. 4. Hyponatremia and anasarca. Start IV Lasix daily 5. Chronic atrial fibrillation on sotalol. Hold anticoagulation secondary to bleeding 6. History of chronic diastolic congestive heart failure.  7. Alcoholic liver cirrhosis with ascites. Likely will end up needing another paracentesis while here. Potentially this week. 8. Weakness. Physical therapy recommends rehabilitation  Code Status:     Code Status Orders        Start     Ordered   12/27/16 2919  Full code  Continuous     12/27/16 0632    Code Status History    Date Active Date Inactive Code Status Order ID Comments User Context   12/15/2016  8:06 PM 12/21/2016  4:29 PM Full Code [id number]  Vaughan Basta, MD ED   04/17/2016  7:26 PM 04/19/2016  5:50 PM Full Code [id number]  Lytle Butte, MD ED     Family Communication: Spoke with girlfriend the phone Disposition Plan: Potentially after rehabilitation this week  Consultants:  Psychiatry  Gen. Surgery  Gastroenterology  Antibiotics:  Zosyn  Time spent: 26 minutes  Loletha Grayer  [employer]

## 2017-01-03 ENCOUNTER — Inpatient Hospital Stay: Payer: BC Managed Care – PPO

## 2017-01-03 LAB — CULTURE, BLOOD (ROUTINE X 2)

## 2017-01-03 LAB — BASIC METABOLIC PANEL
Anion gap: 5 (ref 5–15)
BUN: 32 mg/dL — AB (ref 6–20)
CO2: 29 mmol/L (ref 22–32)
Calcium: 8.2 mg/dL — ABNORMAL LOW (ref 8.9–10.3)
Chloride: 97 mmol/L — ABNORMAL LOW (ref 101–111)
Creatinine, Ser: 1.16 mg/dL (ref 0.61–1.24)
GFR calc Af Amer: >60 mL/min (ref >60)
Glucose, Bld: 109 mg/dL — ABNORMAL HIGH (ref 65–99)
POTASSIUM: 4.1 mmol/L (ref 3.5–5.1)
SODIUM: 131 mmol/L — AB (ref 135–145)

## 2017-01-03 LAB — GLUCOSE, CAPILLARY
Glucose-Capillary: 118 mg/dL — ABNORMAL HIGH (ref 65–99)
Glucose-Capillary: 182 mg/dL — ABNORMAL HIGH (ref 65–99)
Glucose-Capillary: 120 mg/dL — ABNORMAL HIGH (ref 65–99)
Glucose-Capillary: 162 mg/dL — ABNORMAL HIGH (ref 65–99)

## 2017-01-03 LAB — C DIFFICILE QUICK SCREEN W PCR REFLEX
C DIFFICILE (CDIFF) INTERP: NOT DETECTED
C DIFFICLE (CDIFF) ANTIGEN: NEGATIVE
C Diff toxin: NEGATIVE

## 2017-01-03 LAB — BODY FLUID CELL COUNT WITH DIFFERENTIAL
Eos, Fluid: 0 %
LYMPHS FL: 11 %
MONOCYTE-MACROPHAGE-SEROUS FLUID: 17 %
NEUTROPHIL FLUID: 67 %
Other Cells, Fluid: 5 %
WBC FLUID: 73 uL

## 2017-01-03 LAB — GLUCOSE, SEROUS FLUID: Glucose, Fluid: 140 mg/dL

## 2017-01-03 LAB — PROTEIN, BODY FLUID

## 2017-01-03 LAB — MAGNESIUM: MAGNESIUM: 1.7 mg/dL (ref 1.7–2.4)

## 2017-01-03 MED ORDER — CITALOPRAM HYDROBROMIDE 10 MG PO TABS
10.0000 mg | ORAL_TABLET | Freq: Every day | ORAL | Status: DC
  Administered 2017-01-03 – 2017-01-13 (×5): 10 mg via ORAL
  Filled 2017-01-03 (×12): qty 1

## 2017-01-03 MED ORDER — ALBUMIN HUMAN 25 % IV SOLN
25.0000 g | Freq: Once | INTRAVENOUS | Status: AC
  Administered 2017-01-03: 25 g via INTRAVENOUS
  Filled 2017-01-03: qty 100

## 2017-01-03 NOTE — Clinical Social Work Note (Signed)
Attending is expecting patient to potentially need long term IV antibiotics. Patient has a large infected decubitus which is requiring packing. CSW has asked RN CM to look into Long Term Acute Care level of care as this might better serve patient than a SNF. York SpanielMonica Regginald Pask MSW,LCSW [phone]

## 2017-01-03 NOTE — Progress Notes (Signed)
PT Cancellation Note  Patient Details Name: [name] MRN: [id number] DOB: March 17, 1961   Cancelled Treatment:    Reason Eval/Treat Not Completed: Patient at procedure or test/unavailable (pt undergoing paracentesis, unavailable at this time, will check back when patient available)   Tristan Bramble 01/03/2017, 1:43 PM

## 2017-01-03 NOTE — Consult Note (Signed)
[facility] Consult   Reason for Consult:  Consult for 56 year old man currently in the hospital with bleeding and an ulcer on his back Referring Physician:  Bridgett Larsson Patient Identification: [name] MRN:  [id number] Principal Diagnosis: Adjustment disorder with mixed anxiety and depressed mood Diagnosis:   Patient Active Problem List   Diagnosis Date Noted  . Adjustment disorder with mixed anxiety and depressed mood [F43.23] 12/30/2016  . Sepsis ([place]) [A41.9] 12/27/2016  . Alcoholic cirrhosis of liver with ascites ([place]) [K70.31]   . Pressure injury of skin [L89.90] 12/16/2016  . Acute renal failure (ARF) ([place]) [N17.9] 12/15/2016  . Anemia [D64.9] 12/15/2016  . 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 Varicose veins of left lower extremity with both ulcer of calf and inflammation ([place]) [I83.222] 06/25/2016  . Hyponatremia [E87.1] 04/17/2016    Total Time spent with patient: 15 minutes  Subjective:   ANTWAUN BUTH is a 56 y.o. male patient admitted with "I just need to get out of here".  Brief follow-up for this 56 year old man with a sacral decubitus ulcer. Patient has gotten some bad news that he is going to need longer term IV antibiotics. I saw him this evening however and he seems considerably less frustrated and anxious than he was before. He tells me that he is reconciled to it. Denies feeling depressed or nervous. Seems to be better groomed more alert and generally taking care of himself well. No evidence of any dangerousness.  HPI:  Patient interviewed. Chart reviewed. Spoke with the patient and his girlfriend who was present. 21 year old man who denies past psychiatric history. Consult was apparently triggered when a staff person overheard him saying something about how he was going to die in the hospital. On interview today the patient was only partially cooperative. He seemed to be  sedated and a little bit confused which seemed a little unusual. Girlfriend said she thinks it's just from the pain medicine although he seemed pretty remarkably confused for just a 5 mg Percocet. In any case the patient says that he is just irritated and frustrated about being in the hospital. He says he feels he has not gotten a straight answer and gotten enough information. At the same time he is not really able to articulate me anything that he is particularly confused about. He frequently says that he simply needs to get out of the hospital and get back home and he will feel much better. He indicates that he does not like having a lack of control over his environment and situation. He denies that he is feeling consistently sad or depressed. Denies sleeping problems denies appetite problems. Absolutely denies any suicidal thoughts. Girlfriend confirms that the statement he made as not that he was going to kill himself or thought that he was going to die but was a somewhat histrionic statement about how staying in the hospital any longer would "kill him". They have no concerns about any dangerousness on his part. Patient denies any psychotic symptoms. He is not currently on any psychiatric medicine. Patient denies that he had been on any psychiatric medicine and also denies any alcohol abuse history.  At home with his girlfriend. Just the 2 of them at home. It was little unclear to me what his normal activity was like. Sounds like he probably doesn't do very much.  Medical history: Patient has come into the hospital with rectal bleeding and also has a  sacral decubitus that apparently is not healing. He has a diagnosis of cirrhosis. I'm still uncertain about my understanding of that. The diagnosis is placed as alcoholic cirrhosis. The patient absolutely denies any recent drinking and denies that he's ever had a problem with drinking in the past.  Substance abuse history: As noted above he denies any history  of alcohol abuse. I'm not sure whether this is correct or not.  Past Psychiatric History: No history of psychiatric treatment. No history of psychiatric medicine or hospitalization. No history of suicide attempts or violence. Girlfriend says she does not have any concern about any mental health history. She tends to excuse his current confusion and cognitive impairment as simply being the result of his pain medicine and says that he was not having that degree of cognitive impairment at home.  Risk to Self: Is patient at risk for suicide?: No Risk to Others:   Prior Inpatient Therapy:   Prior Outpatient Therapy:    Past Medical History:  Past Medical History:  Diagnosis Date  . Anal fissure   . Atrial fibrillation ([place])   . CHF (congestive heart failure) ([place])   . Heart murmur   . Hypertension   . Lymphadenopathy   . Personal history of colonic polyps     Past Surgical History:  Procedure Laterality Date  . COLONOSCOPY  2012  . ESOPHAGOGASTRODUODENOSCOPY (EGD) WITH PROPOFOL N/A 12/18/2016   Procedure: ESOPHAGOGASTRODUODENOSCOPY (EGD) WITH PROPOFOL;  Surgeon: Lin Landsman, MD;  Location: [facility] ENDOSCOPY;  Service: Endoscopy;  Laterality: N/A;  . HERNIA REPAIR  2012  . larynx-amyloidosis-laser surgery   2010   Family History:  Family History  Problem Relation Age of Onset  . Hypertension Other   . Hypertension Brother    Family Psychiatric  History: Denies any family history of mental illness Social History:  History  Alcohol Use  . Yes     History  Drug Use No    Social History   Social History  . Marital status: Divorced    Spouse name: N/A  . Number of children: N/A  . Years of education: N/A   Social History Main Topics  . Smoking status: Current Every Day Smoker    Packs/day: 1.00    Years: 20.00  . Smokeless tobacco: Never Used  . Alcohol use Yes  . Drug use: No  . Sexual activity: Not Asked   Other Topics Concern  . None   Social History Narrative   . None   Additional Social History:    Allergies:   Allergies  Allergen Reactions  . Clindamycin/Lincomycin Diarrhea    Labs:  Results for orders placed or performed during the hospital encounter of 12/26/16 (from the past 48 hour(s))  Glucose, capillary     Status: Abnormal   Collection Time: 01/01/17  9:01 PM  Result Value Ref Range   Glucose-Capillary 118 (H) 65 - 99 mg/dL   Comment 1 Notify RN   Basic metabolic panel     Status: Abnormal   Collection Time: 01/02/17  6:42 AM  Result Value Ref Range   Sodium 129 (L) 135 - 145 mmol/L   Potassium 4.2 3.5 - 5.1 mmol/L   Chloride 95 (L) 101 - 111 mmol/L   CO2 29 22 - 32 mmol/L   Glucose, Bld 127 (H) 65 - 99 mg/dL   BUN 32 (H) 6 - 20 mg/dL   Creatinine, Ser 1.11 0.61 - 1.24 mg/dL   Calcium 8.0 (L) 8.9 - 10.3 mg/dL  GFR calc non Af Amer >60 >60 mL/min   GFR calc Af Amer >60 >60 mL/min    Comment: (NOTE) The eGFR has been calculated using the CKD EPI equation. This calculation has not been validated in all clinical situations. eGFR's persistently <60 mL/min signify possible Chronic Kidney Disease.    Anion gap 5 5 - 15  CBC     Status: Abnormal   Collection Time: 01/02/17  6:42 AM  Result Value Ref Range   WBC 13.1 (H) 3.8 - 10.6 K/uL   RBC 2.76 (L) 4.40 - 5.90 MIL/uL   Hemoglobin 9.4 (L) 13.0 - 18.0 g/dL   HCT 27.7 (L) 40.0 - 52.0 %   MCV 100.2 (H) 80.0 - 100.0 fL   MCH 34.1 (H) 26.0 - 34.0 pg   MCHC 34.0 32.0 - 36.0 g/dL   RDW 17.3 (H) 11.5 - 14.5 %   Platelets 153 150 - 440 K/uL  Magnesium     Status: None   Collection Time: 01/02/17  6:42 AM  Result Value Ref Range   Magnesium 1.8 1.7 - 2.4 mg/dL  Albumin     Status: Abnormal   Collection Time: 01/02/17  6:42 AM  Result Value Ref Range   Albumin 1.7 (L) 3.5 - 5.0 g/dL  Sedimentation rate     Status: Abnormal   Collection Time: 01/02/17  6:42 AM  Result Value Ref Range   Sed Rate 73 (H) 0 - 20 mm/hr  Glucose, capillary     Status: Abnormal   Collection  Time: 01/02/17  7:47 AM  Result Value Ref Range   Glucose-Capillary 129 (H) 65 - 99 mg/dL  Aerobic/Anaerobic Culture (surgical/deep wound)     Status: None (Preliminary result)   Collection Time: 01/02/17 10:23 AM  Result Value Ref Range   Specimen Description DECUBITIS    Special Requests NONE    Gram Stain      MODERATE WBC PRESENT, PREDOMINANTLY PMN FEW GRAM POSITIVE COCCI IN PAIRS FEW GRAM POSITIVE RODS RARE GRAM NEGATIVE RODS    Culture      CULTURE REINCUBATED FOR BETTER GROWTH Performed at [facility], [place]. [address] [place], [place] [address]    Report Status PENDING   C difficile quick scan w PCR reflex     Status: None   Collection Time: 01/02/17 11:24 AM  Result Value Ref Range   C Diff antigen NEGATIVE NEGATIVE   C Diff toxin NEGATIVE NEGATIVE   C Diff interpretation No C. difficile detected.   Glucose, capillary     Status: Abnormal   Collection Time: 01/02/17 11:40 AM  Result Value Ref Range   Glucose-Capillary 128 (H) 65 - 99 mg/dL  CULTURE, BLOOD (ROUTINE X 2) w Reflex to ID Panel     Status: None (Preliminary result)   Collection Time: 01/02/17  3:26 PM  Result Value Ref Range   Specimen Description BLOOD RIGHT HAND    Special Requests      BOTTLES DRAWN AEROBIC AND ANAEROBIC ANA14ML AER11ML   Culture NO GROWTH < 24 HOURS    Report Status PENDING   CULTURE, BLOOD (ROUTINE X 2) w Reflex to ID Panel     Status: None (Preliminary result)   Collection Time: 01/02/17  3:27 PM  Result Value Ref Range   Specimen Description BLOOD LEFT ASSIST CONTROL    Special Requests BOTTLES DRAWN AEROBIC AND ANAEROBIC AER9ML ANA8ML    Culture NO GROWTH < 24 HOURS    Report Status PENDING  Glucose, capillary     Status: Abnormal   Collection Time: 01/02/17  4:21 PM  Result Value Ref Range   Glucose-Capillary 128 (H) 65 - 99 mg/dL  Glucose, capillary     Status: Abnormal   Collection Time: 01/02/17  8:11 PM  Result Value Ref Range   Glucose-Capillary 135 (H) 65 -  99 mg/dL  Glucose, capillary     Status: Abnormal   Collection Time: 01/02/17 10:27 PM  Result Value Ref Range   Glucose-Capillary 143 (H) 65 - 99 mg/dL  Basic metabolic panel     Status: Abnormal   Collection Time: 01/03/17  4:43 AM  Result Value Ref Range   Sodium 131 (L) 135 - 145 mmol/L   Potassium 4.1 3.5 - 5.1 mmol/L   Chloride 97 (L) 101 - 111 mmol/L   CO2 29 22 - 32 mmol/L   Glucose, Bld 109 (H) 65 - 99 mg/dL   BUN 32 (H) 6 - 20 mg/dL   Creatinine, Ser 1.16 0.61 - 1.24 mg/dL   Calcium 8.2 (L) 8.9 - 10.3 mg/dL   GFR calc non Af Amer >60 >60 mL/min   GFR calc Af Amer >60 >60 mL/min    Comment: (NOTE) The eGFR has been calculated using the CKD EPI equation. This calculation has not been validated in all clinical situations. eGFR's persistently <60 mL/min signify possible Chronic Kidney Disease.    Anion gap 5 5 - 15  Magnesium     Status: None   Collection Time: 01/03/17  4:43 AM  Result Value Ref Range   Magnesium 1.7 1.7 - 2.4 mg/dL  Glucose, capillary     Status: Abnormal   Collection Time: 01/03/17  7:37 AM  Result Value Ref Range   Glucose-Capillary 118 (H) 65 - 99 mg/dL  Glucose, capillary     Status: Abnormal   Collection Time: 01/03/17 11:32 AM  Result Value Ref Range   Glucose-Capillary 182 (H) 65 - 99 mg/dL  Protein, pleural or peritoneal fluid     Status: None   Collection Time: 01/03/17  2:25 PM  Result Value Ref Range   Total protein, fluid <3.0 g/dL    Comment: (NOTE) No normal range established for this test Results should be evaluated in conjunction with serum values    Fluid Type-FTP CYTOPERI   Body fluid cell count with differential     Status: Abnormal   Collection Time: 01/03/17  2:25 PM  Result Value Ref Range   Fluid Type-FCT CYTOPERI    Color, Fluid YELLOW YELLOW   Appearance, Fluid HAZY (A) CLEAR   WBC, Fluid 73 cu mm   Neutrophil Count, Fluid 67 %   Lymphs, Fluid 11 %   Monocyte-Macrophage-Serous Fluid 17 %   Eos, Fluid 0 %    Other Cells, Fluid 5 %  Glucose, pleural or peritoneal fluid     Status: None   Collection Time: 01/03/17  2:25 PM  Result Value Ref Range   Glucose, Fluid 140 mg/dL    Comment: (NOTE) No normal range established for this test Results should be evaluated in conjunction with serum values    Fluid Type-FGLU CYTOPERI   Glucose, capillary     Status: Abnormal   Collection Time: 01/03/17  4:45 PM  Result Value Ref Range   Glucose-Capillary 120 (H) 65 - 99 mg/dL    Current Facility-Administered Medications  Medication Dose Route Frequency Provider Last Rate Last Dose  . acetaminophen (TYLENOL) tablet 650 mg  650 mg Oral  Q6H PRN Harrie Foreman, MD       Or  . acetaminophen (TYLENOL) suppository 650 mg  650 mg Rectal Q6H PRN Harrie Foreman, MD      . ammonium lactate (LAC-HYDRIN) 12 % lotion   Topical Daily Clayburn Pert, MD      . citalopram (CELEXA) tablet 10 mg  10 mg Oral QHS Loletha Grayer, MD      . diphenhydrAMINE (BENADRYL) injection 12.5 mg  12.5 mg Intravenous Once Harrie Foreman, MD      . folic acid (FOLVITE) tablet 1 mg  1 mg Oral Daily Harrie Foreman, MD   1 mg at 01/03/17 1126  . furosemide (LASIX) injection 40 mg  40 mg Intravenous Daily Loletha Grayer, MD   40 mg at 01/03/17 1126  . insulin aspart (novoLOG) injection 2-6 Units  2-6 Units Subcutaneous TID AC & HS Laverle Hobby, MD   4 Units at 01/03/17 1250  . ondansetron (ZOFRAN) tablet 4 mg  4 mg Oral Q6H PRN Harrie Foreman, MD       Or  . ondansetron [facility]) injection 4 mg  4 mg Intravenous Q6H PRN Harrie Foreman, MD      . oxyCODONE (Oxy IR/ROXICODONE) immediate release tablet 5 mg  5 mg Oral Q6H PRN Merilyn Baba, RPH   5 mg at 12/31/16 1254  . pneumococcal 23 valent vaccine (PNU-IMMUNE) injection 0.5 mL  0.5 mL Intramuscular Tomorrow-1000 Laverle Hobby, MD      . potassium chloride SA (K-DUR,KLOR-CON) CR tablet 20 mEq  20 mEq Oral Daily Demetrios Loll, MD   20 mEq at 01/03/17 1126  .  protein supplement (PREMIER PROTEIN) liquid  11 oz Oral BID BM Demetrios Loll, MD   11 oz at 01/03/17 1000  . sodium chloride flush (NS) 0.9 % injection 3 mL  3 mL Intravenous Q12H Harrie Foreman, MD   3 mL at 01/03/17 1000  . sotalol (BETAPACE) tablet 80 mg  80 mg Oral BID Harrie Foreman, MD   80 mg at 01/03/17 1126    Musculoskeletal: Strength & Muscle Tone: decreased Gait & Station: unable to stand Patient leans: N/A  Psychiatric Specialty Exam: Physical Exam  Nursing note and vitals reviewed. Constitutional: He appears well-developed and well-nourished.  HENT:  Head: Normocephalic and atraumatic.  Eyes: Conjunctivae are normal. Pupils are equal, round, and reactive to light.  Neck: Normal range of motion.  Cardiovascular: Regular rhythm and normal heart sounds.   Respiratory: Effort normal. No respiratory distress.  GI: Soft. He exhibits no distension.  Musculoskeletal: Normal range of motion.  Neurological: He is alert.  Skin: Skin is warm and dry.  Psychiatric: His speech is normal and behavior is normal. Judgment normal. His affect is not blunt and not inappropriate. He is not withdrawn. Thought content is not paranoid. He does not express impulsivity. He expresses no homicidal and no suicidal ideation. He exhibits abnormal recent memory.    Review of Systems  Constitutional: Negative.   HENT: Negative.   Eyes: Negative.   Respiratory: Negative.   Cardiovascular: Negative.   Gastrointestinal: Negative.   Musculoskeletal: Positive for back pain.  Skin: Negative.   Neurological: Negative.   Psychiatric/Behavioral: Negative for depression, hallucinations, memory loss, substance abuse and suicidal ideas. The patient is not nervous/anxious and does not have insomnia.     Blood pressure 91/69, pulse 70, temperature 98.4 F (36.9 C), resp. rate 18, height _0  (1.93 m), weight (!) 142.9 kg (  315 lb), SpO2 93 %.Body mass index is 38.34 kg/m.  General Appearance: Disheveled   Eye Contact:  Minimal  Speech:  Slow and Slurred  Volume:  Decreased  Mood:  Irritable  Affect:  Congruent  Thought Process:  Disorganized  Orientation:  Full (Time, Place, and Person)  Thought Content:  Tangential  Suicidal Thoughts:  No  Homicidal Thoughts:  No  Memory:  Immediate;   Fair Recent;   Poor Remote;   Fair  Judgement:  Impaired  Insight:  Shallow  Psychomotor Activity:  Decreased  Concentration:  Concentration: Fair  Recall:  [employer] of Knowledge:  Fair  Language:  Fair  Akathisia:  No  Handed:  Right  AIMS (if indicated):     Assets:  Desire for Improvement Housing Intimacy Social Support  ADL's:  Impaired  Cognition:  Impaired,  Mild  Sleep:        Treatment Plan Summary: Daily contact with patient to assess and evaluate symptoms and progress in treatment and Plan Supportive counseling. No changed any medicine. We'll follow-up only as needed.  Disposition: Patient does not meet criteria for psychiatric inpatient admission.  Alethia Berthold, MD 01/03/2017 8:00 PM

## 2017-01-03 NOTE — Progress Notes (Signed)
Patient ID: [name], male   DOB: January 09, 1961, 56 y.o.   MRN: [id number]  [facility] PROGRESS NOTE  [name]:[id number] DOB: 08-07-1961 DOA: 12/26/2016 PCP: [name] Athens, MD  HPI/Subjective: Patient wants to get out of the hospital soon as possible. Anxious about being here. Still feels very weak.  Objective: Vitals:   01/03/17 1432 01/03/17 1500  BP: 96/78 95/73  Pulse: 74 70  Resp: 20 18  Temp:      Filed Weights   01/01/17 0500 01/02/17 0500 01/03/17 0556  Weight: (!) 148.8 kg (328 lb) (!) 147.4 kg (325 lb) (!) 142.9 kg (315 lb)    ROS: Review of Systems  Constitutional: Negative for chills and fever.  Eyes: Negative for blurred vision.  Respiratory: Negative for cough and shortness of breath.   Cardiovascular: Negative for chest pain.  Gastrointestinal: Negative for abdominal pain, constipation, diarrhea, nausea and vomiting.  Genitourinary: Negative for dysuria.  Musculoskeletal: Negative for joint pain.  Neurological: Positive for weakness. Negative for dizziness and headaches.   Exam: Physical Exam  Constitutional: He is oriented to person, place, and time.  HENT:  Nose: No mucosal edema.  Mouth/Throat: No oropharyngeal exudate or posterior oropharyngeal edema.  Eyes: Conjunctivae, EOM and lids are normal. Pupils are equal, round, and reactive to light.  Neck: No JVD present. Carotid bruit is not present. No edema present. No thyroid mass and no thyromegaly present.  Cardiovascular: S1 normal and S2 normal.  An irregularly irregular rhythm present. Exam reveals no gallop.   No murmur heard. Pulses:      Dorsalis pedis pulses are 2+ on the right side, and 2+ on the left side.  Respiratory: No respiratory distress. He has decreased breath sounds in the right lower field. He has no wheezes. He has no rhonchi. He has no rales.  GI: Soft. Bowel sounds are normal. He exhibits distension and fluid wave. There is no tenderness.  Genitourinary:   Genitourinary Comments: 4+ scrotal and penis swelling  Musculoskeletal:       Right ankle: He exhibits swelling.       Left ankle: He exhibits swelling.  Lymphadenopathy:    He has no cervical adenopathy.  Neurological: He is alert and oriented to person, place, and time. No cranial nerve deficit.  Skin: Skin is warm. Nails show no clubbing.  Large sacral decubitus requiring packing with some necrotic material surrounding. Patient has 2 superficial areas of skin breakdown surrounding this.  Psychiatric: He has a normal mood and affect.      Data Reviewed: Basic Metabolic Panel:  Recent Labs Lab 12/28/16 0555 12/29/16 0532 12/30/16 7510 12/31/16 0526 01/01/17 1229 01/02/17 0642 01/03/17 0443  NA 132* 131* 131* 129* 127* 129* 131*  K 4.2 4.1 3.8 4.0 4.4 4.2 4.1  CL 100* 99* 99* 96* 95* 95* 97*  CO2 25 27 27 28 26 29 29   GLUCOSE 161* 141* 159* 147* 164* 127* 109*  BUN 38* 34* 32* 30* 33* 32* 32*  CREATININE 1.47* 1.33* 1.29* 1.16 1.24 1.11 1.16  CALCIUM 7.9* 8.2* 7.8* 8.0* 8.0* 8.0* 8.2*  MG 1.6* 1.7 1.8  --  1.7 1.8 1.7  PHOS 3.6  --   --   --   --   --   --    Liver Function Tests:  Recent Labs Lab 01/02/17 0642  ALBUMIN 1.7*    Recent Labs Lab 12/30/16 1734  AMMONIA 44*   CBC:  Recent Labs Lab 12/28/16 0555 12/29/16 0532 12/30/16  2536 12/31/16 0526 01/02/17 0642  WBC 24.4* 18.7* 17.2* 14.0* 13.1*  HGB 9.1* 8.4* 8.8* 9.4* 9.4*  HCT 26.6* 25.3* 25.5* 26.9* 27.7*  MCV 100.5* 101.9* 101.3* 99.1 100.2*  PLT 176 116* 108* 120* 153    CBG:  Recent Labs Lab 01/02/17 1621 01/02/17 2011 01/02/17 2227 01/03/17 0737 01/03/17 1132  GLUCAP 128* 135* 143* 118* 182*    Recent Results (from the past 240 hour(s))  Culture, blood (routine x 2)     Status: Abnormal   Collection Time: 12/27/16 12:40 AM  Result Value Ref Range Status   Specimen Description BLOOD RIGHT HAND  Final   Special Requests   Final    BOTTLES DRAWN AEROBIC AND ANAEROBIC  AERO5ML,ANAERO4ML   Culture  Setup Time   Final    GRAM POSITIVE RODS ANAEROBIC BOTTLE ONLY CRITICAL VALUE NOTED.  VALUE IS CONSISTENT WITH PREVIOUSLY REPORTED AND CALLED VALUE.    Culture (A)  Final    ANAEROBIC GRAM POSITIVE RODS UNABLE TO FURTHER IDENTIFY. CORRECTED RESULTS CALLED TO: A ROBINSON,RN AT 6440 01/03/17 BY L BENFIELD PREVIOUSLY REPORTED AS DIPTHEROIDS Performed at [facility], [place] [address] [place], [place] [address]    Report Status 01/03/2017 FINAL  Final  Culture, blood (routine x 2)     Status: Abnormal   Collection Time: 12/27/16 12:40 AM  Result Value Ref Range Status   Specimen Description BLOOD LEFT HAND  Final   Special Requests   Final    BOTTLES DRAWN AEROBIC AND ANAEROBIC AERO3ML,ANAERO10ML   Culture  Setup Time   Final    GRAM POSITIVE RODS ANAEROBIC BOTTLE ONLY CRITICAL RESULT CALLED TO, READ BACK BY AND VERIFIED WITH: HANK ZOMPA 12/29/16 0900 SGD    Culture (A)  Final    ANAEROBIC GRAM POSITIVE RODS UNABLE TO FURTHER IDENTIFY. Performed at [facility], [place] [address], [place], [place] [address]    Report Status 01/02/2017 FINAL  Final  Blood Culture ID Panel (Reflexed)     Status: None   Collection Time: 12/27/16 12:40 AM  Result Value Ref Range Status   Enterococcus species NOT DETECTED NOT DETECTED Final   Listeria monocytogenes NOT DETECTED NOT DETECTED Final   Staphylococcus species NOT DETECTED NOT DETECTED Final   Staphylococcus aureus NOT DETECTED NOT DETECTED Final   Streptococcus species NOT DETECTED NOT DETECTED Final   Streptococcus agalactiae NOT DETECTED NOT DETECTED Final   Streptococcus pneumoniae NOT DETECTED NOT DETECTED Final   Streptococcus pyogenes NOT DETECTED NOT DETECTED Final   Acinetobacter baumannii NOT DETECTED NOT DETECTED Final   Enterobacteriaceae species NOT DETECTED NOT DETECTED Final   Enterobacter cloacae complex NOT DETECTED NOT DETECTED Final   Escherichia coli NOT DETECTED NOT DETECTED Final    Klebsiella oxytoca NOT DETECTED NOT DETECTED Final   Klebsiella pneumoniae NOT DETECTED NOT DETECTED Final   Proteus species NOT DETECTED NOT DETECTED Final   Serratia marcescens NOT DETECTED NOT DETECTED Final   Haemophilus influenzae NOT DETECTED NOT DETECTED Final   Neisseria meningitidis NOT DETECTED NOT DETECTED Final   Pseudomonas aeruginosa NOT DETECTED NOT DETECTED Final   Candida albicans NOT DETECTED NOT DETECTED Final   Candida glabrata NOT DETECTED NOT DETECTED Final   Candida krusei NOT DETECTED NOT DETECTED Final   Candida parapsilosis NOT DETECTED NOT DETECTED Final   Candida tropicalis NOT DETECTED NOT DETECTED Final  MRSA PCR Screening     Status: None   Collection Time: 12/27/16  6:30 AM  Result Value  Ref Range Status   MRSA by PCR NEGATIVE NEGATIVE Final    Comment:        The GeneXpert MRSA Assay (FDA approved for NASAL specimens only), is one component of a comprehensive MRSA colonization surveillance program. It is not intended to diagnose MRSA infection nor to guide or monitor treatment for MRSA infections.   Urine culture     Status: None   Collection Time: 12/27/16 11:32 AM  Result Value Ref Range Status   Specimen Description URINE, CLEAN CATCH  Final   Special Requests NONE  Final   Culture   Final    NO GROWTH Performed at [facility], [address], [place], [place] [address]    Report Status 12/28/2016 FINAL  Final  Aerobic/Anaerobic Culture (surgical/deep wound)     Status: None (Preliminary result)   Collection Time: 01/02/17 10:23 AM  Result Value Ref Range Status   Specimen Description DECUBITIS  Final   Special Requests NONE  Final   Gram Stain   Final    MODERATE WBC PRESENT, PREDOMINANTLY PMN FEW GRAM POSITIVE COCCI IN PAIRS FEW GRAM POSITIVE RODS RARE GRAM NEGATIVE RODS    Culture   Final    CULTURE REINCUBATED FOR BETTER GROWTH Performed at [facility], [place] [address] [place], [place] [address]    Report  Status PENDING  Incomplete  C difficile quick scan w PCR reflex     Status: None   Collection Time: 01/02/17 11:24 AM  Result Value Ref Range Status   C Diff antigen NEGATIVE NEGATIVE Final   C Diff toxin NEGATIVE NEGATIVE Final   C Diff interpretation No C. difficile detected.  Final  CULTURE, BLOOD (ROUTINE X 2) w Reflex to ID Panel     Status: None (Preliminary result)   Collection Time: 01/02/17  3:26 PM  Result Value Ref Range Status   Specimen Description BLOOD RIGHT HAND  Final   Special Requests   Final    BOTTLES DRAWN AEROBIC AND ANAEROBIC ANA14ML AER11ML   Culture NO GROWTH < 24 HOURS  Final   Report Status PENDING  Incomplete  CULTURE, BLOOD (ROUTINE X 2) w Reflex to ID Panel     Status: None (Preliminary result)   Collection Time: 01/02/17  3:27 PM  Result Value Ref Range Status   Specimen Description BLOOD LEFT ASSIST CONTROL  Final   Special Requests BOTTLES DRAWN AEROBIC AND ANAEROBIC AER9ML ANA8ML  Final   Culture NO GROWTH < 24 HOURS  Final   Report Status PENDING  Incomplete      Scheduled Meds: . albumin human  25 g Intravenous Once  . ammonium lactate   Topical Daily  . citalopram  10 mg Oral QHS  . diphenhydrAMINE  12.5 mg Intravenous Once  . folic acid  1 mg Oral Daily  . furosemide  40 mg Intravenous Daily  . insulin aspart  2-6 Units Subcutaneous TID AC & HS  . pneumococcal 23 valent vaccine  0.5 mL Intramuscular Tomorrow-1000  . potassium chloride  20 mEq Oral Daily  . protein supplement shake  11 oz Oral BID BM  . sodium chloride flush  3 mL Intravenous Q12H  . sotalol  80 mg Oral BID    Assessment/Plan:   1. Hemorrhagic shock due to bleeding sacral ulcer while on anticoagulation. Acute hemorrhagic anemia. Blood pressure and hemoglobin improved. 2. Infected decubitus ulcer Which is at least a stage III. On Zosyn. I still think the anaerobic  gram-positive Rod is a contamination from the blood cultures. Repeat blood cultures today.  ESR is high.  Case discussed with Dr. Ola Spurr infectious disease to see the patient today and set up a plan. Patient will likely need longer term antibiotics. 3. Acute kidney injury on chronic kidney disease stage II continue to monitor. 4. Hyponatremia and anasarca. Start IV Lasix daily 5. Chronic atrial fibrillation on sotalol. Hold anticoagulation secondary to bleeding 6. History of chronic diastolic congestive heart failure.  7. Alcoholic liver cirrhosis with ascites. Ordered another paracentesis for today 8. Weakness. Physical therapy recommends rehabilitation  Code Status:     Code Status Orders        Start     Ordered   12/27/16 0350  Full code  Continuous     12/27/16 0632    Code Status History    Date Active Date Inactive Code Status Order ID Comments User Context   12/15/2016  8:06 PM 12/21/2016  4:29 PM Full Code [id number]  Vaughan Basta, MD ED   04/17/2016  7:26 PM 04/19/2016  5:50 PM Full Code [id number]  Lytle Butte, MD ED     Family Communication: Spoke with girlfriend At the bedside Disposition Plan: Potentially after rehabilitation this week  Consultants:  Psychiatry  Gen. Surgery  Gastroenterology  Antibiotics:  Zosyn  Time spent: 25 minutes  [place], [facility]

## 2017-01-03 NOTE — Progress Notes (Signed)
ID brief note Pt off floor getting paracentesis so unable to be seen. Reviewed chart.  On admission for bleeding sacral decub his wbc was 27 and temp 102.  WBC now down to 13 and esr 73. CT scan showed extension of the ulcer to coccyx, lower sacrum and R ischial fossa.  He has been on zosyn since admission. bcx with diphteroids. Wound cx being held for anaerobes.   I will see in AM but likely can be treated with Oral augmentin and doxcycline for a 28 day course Will need follow up with wound center and management of multiple other medical problems

## 2017-01-03 NOTE — Progress Notes (Addendum)
MEDICATION RELATED CONSULT NOTE  Pharmacy Consult for piperacillin/tazobactam and electrolyte monitoring/replacement Indication: sepsis possibly from Infected decubitus ulcer.  Assessment: 55yoM on home apixaban, who presents with sacral bleeding given KCENTRA. Pt found to be septic. Pharmacy consulted for piperacillin/tazobactam dosing. Patient receiving potassium 20mEq PO daily.   Plan:  1. Last dose of piperacillin/tazobactam 4.5g IV q8h in progress  2. 1/22 am K=4.1; mag 1.7  Patient on KCL PO 20 MEQ Daily and furosemide 40 IV Daily  Will continue follow electrolytes  Allergies  Allergen Reactions  . Clindamycin/Lincomycin Diarrhea    Patient Measurements: Height: 6\' 4"  (193 cm) Weight: (!) 315 lb (142.9 kg) IBW/kg (Calculated) : 86.8  Vital Signs: Temp: 97.2 F (36.2 C) (01/22 0556) Temp Source: Oral (01/22 0556) BP: 117/88 (01/22 0556) Pulse Rate: 77 (01/22 0556) Intake/Output from previous day: 01/21 0701 - 01/22 0700 In: 220 [P.O.:220] Out: 0  Intake/Output from this shift: No intake/output data recorded.  Labs:  Recent Labs  01/01/17 1229 01/02/17 0642 01/03/17 0443  WBC  --  13.1*  --   HGB  --  9.4*  --   HCT  --  27.7*  --   PLT  --  153  --   CREATININE 1.24 1.11 1.16  MG 1.7 1.8  --   ALBUMIN  --  1.7*  --    Estimated Creatinine Clearance: 109.8 mL/min (by C-G formula based on SCr of 1.16 mg/dL).   Microbiology: Recent Results (from the past 720 hour(s))  Body fluid culture     Status: None   Collection Time: 12/17/16  1:15 PM  Result Value Ref Range Status   Specimen Description PERITONEAL  Final   Special Requests NONE  Final   Gram Stain   Final    FEW WBC PRESENT,BOTH PMN AND MONONUCLEAR NO ORGANISMS SEEN    Culture   Final    No growth aerobically or anaerobically. Performed at [facility]    Report Status 12/20/2016 FINAL  Final  Culture, blood (routine x 2)     Status: Abnormal (Preliminary result)   Collection  Time: 12/27/16 12:40 AM  Result Value Ref Range Status   Specimen Description BLOOD RIGHT HAND  Final   Special Requests   Final    BOTTLES DRAWN AEROBIC AND ANAEROBIC AERO5ML,ANAERO4ML   Culture  Setup Time   Final    GRAM POSITIVE RODS ANAEROBIC BOTTLE ONLY CRITICAL VALUE NOTED.  VALUE IS CONSISTENT WITH PREVIOUSLY REPORTED AND CALLED VALUE.    Culture (A)  Final    DIPHTHEROIDS(CORYNEBACTERIUM SPECIES) Standardized susceptibility testing for this organism is not available. Performed at [facility], [address] [place], [place] [address]    Report Status PENDING  Incomplete  Culture, blood (routine x 2)     Status: Abnormal   Collection Time: 12/27/16 12:40 AM  Result Value Ref Range Status   Specimen Description BLOOD LEFT HAND  Final   Special Requests   Final    BOTTLES DRAWN AEROBIC AND ANAEROBIC AERO3ML,ANAERO10ML   Culture  Setup Time   Final    GRAM POSITIVE RODS ANAEROBIC BOTTLE ONLY CRITICAL RESULT CALLED TO, READ BACK BY AND VERIFIED WITH: HANK ZOMPA 12/29/16 0900 SGD    Culture (A)  Final    ANAEROBIC GRAM POSITIVE RODS UNABLE TO FURTHER IDENTIFY. Performed at [facility], [address] [place], [place] [address]    Report Status 01/02/2017 FINAL  Final  Blood Culture ID Panel (Reflexed)  Status: None   Collection Time: 12/27/16 12:40 AM  Result Value Ref Range Status   Enterococcus species NOT DETECTED NOT DETECTED Final   Listeria monocytogenes NOT DETECTED NOT DETECTED Final   Staphylococcus species NOT DETECTED NOT DETECTED Final   Staphylococcus aureus NOT DETECTED NOT DETECTED Final   Streptococcus species NOT DETECTED NOT DETECTED Final   Streptococcus agalactiae NOT DETECTED NOT DETECTED Final   Streptococcus pneumoniae NOT DETECTED NOT DETECTED Final   Streptococcus pyogenes NOT DETECTED NOT DETECTED Final   Acinetobacter baumannii NOT DETECTED NOT DETECTED Final   Enterobacteriaceae species NOT DETECTED NOT DETECTED Final    Enterobacter cloacae complex NOT DETECTED NOT DETECTED Final   Escherichia coli NOT DETECTED NOT DETECTED Final   Klebsiella oxytoca NOT DETECTED NOT DETECTED Final   Klebsiella pneumoniae NOT DETECTED NOT DETECTED Final   Proteus species NOT DETECTED NOT DETECTED Final   Serratia marcescens NOT DETECTED NOT DETECTED Final   Haemophilus influenzae NOT DETECTED NOT DETECTED Final   Neisseria meningitidis NOT DETECTED NOT DETECTED Final   Pseudomonas aeruginosa NOT DETECTED NOT DETECTED Final   Candida albicans NOT DETECTED NOT DETECTED Final   Candida glabrata NOT DETECTED NOT DETECTED Final   Candida krusei NOT DETECTED NOT DETECTED Final   Candida parapsilosis NOT DETECTED NOT DETECTED Final   Candida tropicalis NOT DETECTED NOT DETECTED Final  MRSA PCR Screening     Status: None   Collection Time: 12/27/16  6:30 AM  Result Value Ref Range Status   MRSA by PCR NEGATIVE NEGATIVE Final    Comment:        The GeneXpert MRSA Assay (FDA approved for NASAL specimens only), is one component of a comprehensive MRSA colonization surveillance program. It is not intended to diagnose MRSA infection nor to guide or monitor treatment for MRSA infections.   Urine culture     Status: None   Collection Time: 12/27/16 11:32 AM  Result Value Ref Range Status   Specimen Description URINE, CLEAN CATCH  Final   Special Requests NONE  Final   Culture   Final    NO GROWTH Performed at [facility], [address], [place], [place] [address]    Report Status 12/28/2016 FINAL  Final  Aerobic/Anaerobic Culture (surgical/deep wound)     Status: None (Preliminary result)   Collection Time: 01/02/17 10:23 AM  Result Value Ref Range Status   Specimen Description DECUBITIS  Final   Special Requests NONE  Final   Gram Stain   Final    MODERATE WBC PRESENT, PREDOMINANTLY PMN FEW GRAM POSITIVE COCCI IN PAIRS FEW GRAM POSITIVE RODS RARE GRAM NEGATIVE RODS Performed at [facility],  [address] [place], [place] [address]    Culture PENDING  Incomplete   Report Status PENDING  Incomplete    Antibiotics: Cipro 1/15 >>1/15 Vancomycin 1/15 >>1/15 Piperacillin/tazo 1/15 >> 1/22  Cultures: 1/21 BCx: sent 1/15 BCx: 1/4 bottles + anaerobic GPR= DIPHTHEROIDS(CORYNEBACTERIUM SPECIES)  1/15 BCx: NGTD 1/15 MRSA PCR: negative  1/15 UCx: NG  Horris Latino, PharmD Pharmacy Resident 01/03/2017 7:13 AM

## 2017-01-04 LAB — MISC LABCORP TEST (SEND OUT): Labcorp test code: 19588

## 2017-01-04 LAB — GLUCOSE, CAPILLARY
Glucose-Capillary: 114 mg/dL — ABNORMAL HIGH (ref 65–99)
Glucose-Capillary: 121 mg/dL — ABNORMAL HIGH (ref 65–99)
Glucose-Capillary: 158 mg/dL — ABNORMAL HIGH (ref 65–99)

## 2017-01-04 LAB — AMMONIA: Ammonia: <9 umol/L — ABNORMAL LOW (ref 9–35)

## 2017-01-04 MED ORDER — ADULT MULTIVITAMIN W/MINERALS CH
1.0000 | ORAL_TABLET | Freq: Every day | ORAL | Status: DC
  Administered 2017-01-05 – 2017-01-14 (×10): 1 via ORAL
  Filled 2017-01-04 (×11): qty 1

## 2017-01-04 NOTE — Consult Note (Signed)
[facility] Infectious Disease     Reason for Consult: decubitus ulcer    Referring Physician: Earleen Newport, Alfonso Patten Date of Admission:  12/26/2016   Principal Problem:   Adjustment disorder with mixed anxiety and depressed mood Active Problems:   Sepsis ([place])   Alcoholic cirrhosis of liver with ascites ([place])   HPI: [name] is a 56 y.o. male admitted with bleeding from rectum and from decubitus ulcer around rectum. He has atrial fibrillation on anticoagulation, congestive heart failure, hypertension, alcoholic liver cirrhosis, and severe peripheral vascular disease. Had recent admission for anemia, ARF and weakness. He had developed decubitus ulcer and was getting HH and wound care at home following that dc 1/9.  On admit temp 102, wbc 27 and septic. BCX from admit + GPR anaerobic.  MRSA PCR neg. Started vanco and zosyn Since admission has been seen by surgery and wound care.  CT scan showed extension of the ulcer to coccyx, lower sacrum and R ischial fossa.  He has had progressive decline over last 2 months- has increasing LE edema, increasing immobility. Recently dxed with cirrhosis for etoh. Has stopped drinking.   Past Medical History:  Diagnosis Date  . Anal fissure   . Atrial fibrillation ([place])   . CHF (congestive heart failure) ([place])   . Heart murmur   . Hypertension   . Lymphadenopathy   . Personal history of colonic polyps    Past Surgical History:  Procedure Laterality Date  . COLONOSCOPY  2012  . ESOPHAGOGASTRODUODENOSCOPY (EGD) WITH PROPOFOL N/A 12/18/2016   Procedure: ESOPHAGOGASTRODUODENOSCOPY (EGD) WITH PROPOFOL;  Surgeon: Lin Landsman, MD;  Location: [facility] ENDOSCOPY;  Service: Endoscopy;  Laterality: N/A;  . HERNIA REPAIR  2012  . larynx-amyloidosis-laser surgery   2010   Social History  Substance Use Topics  . Smoking status: Current Every Day Smoker    Packs/day: 1.00    Years: 20.00  . Smokeless tobacco: Never Used  . Alcohol use Yes   Family History   Problem Relation Age of Onset  . Hypertension Other   . Hypertension Brother     Allergies:  Allergies  Allergen Reactions  . Clindamycin/Lincomycin Diarrhea    Current antibiotics: Antibiotics Given (last 72 hours)    Date/Time Action Medication Dose Rate   01/01/17 1646 Given   piperacillin-tazobactam (ZOSYN) IVPB 4.5 g 4.5 g 25 mL/hr   01/01/17 2250 Given   piperacillin-tazobactam (ZOSYN) IVPB 4.5 g 4.5 g 25 mL/hr   01/02/17 0835 Given   piperacillin-tazobactam (ZOSYN) IVPB 4.5 g 4.5 g 25 mL/hr   01/02/17 1557 Given   piperacillin-tazobactam (ZOSYN) IVPB 4.5 g 4.5 g 25 mL/hr   01/02/17 2235 Given  [[facility] notified scanner scanned as incomplete]   piperacillin-tazobactam (ZOSYN) IVPB 4.5 g 4.5 g 25 mL/hr   01/03/17 0617 Given   piperacillin-tazobactam (ZOSYN) IVPB 4.5 g 4.5 g 25 mL/hr      MEDICATIONS: . ammonium lactate   Topical Daily  . citalopram  10 mg Oral QHS  . diphenhydrAMINE  12.5 mg Intravenous Once  . folic acid  1 mg Oral Daily  . furosemide  40 mg Intravenous Daily  . multivitamin with minerals  1 tablet Oral Daily  . pneumococcal 23 valent vaccine  0.5 mL Intramuscular Tomorrow-1000  . potassium chloride  20 mEq Oral Daily  . protein supplement shake  11 oz Oral BID BM  . sodium chloride flush  3 mL Intravenous Q12H  . sotalol  80 mg Oral BID  Review of Systems - 11 systems reviewed and negative per HPI   OBJECTIVE: Temp:  [97.4 F (36.3 C)-98.3 F (36.8 C)] 97.9 F (36.6 C) (01/23 1354) Pulse Rate:  [55-70] 65 (01/23 1355) Resp:  [18-20] 18 (01/23 1354) BP: (89-108)/(60-70) 93/67 (01/23 1355) SpO2:  [91 %-99 %] 92 % (01/23 1354) Physical Exam  Constitutional: He is oriented to person, place, and time. Obese, chronically ill appearing HENT: mild icterus Mouth/Throat: Oropharynx is clear and moist. No oropharyngeal exudate.  Cardiovascular: Normal rate, regular rhythm and normal heart sounds. Pulmonary/Chest: Effort normal and  breath sounds normal. No respiratory distress. He has no wheezes.  Abdominal: Soft. Bowel sounds are normal. Distended, but not tense Lymphadenopathy:  He has no cervical adenopathy.  Neurological: He is alert and oriented to person, place, and time.  Skin: sacrum with large deep wound probing about 5 cm deep, extensive necortic tissue, odor and foul drainage Psychiatric: anxious  LABS: Results for orders placed or performed during the hospital encounter of 12/26/16 (from the past 48 hour(s))  CULTURE, BLOOD (ROUTINE X 2) w Reflex to ID Panel     Status: None (Preliminary result)   Collection Time: 01/02/17  3:26 PM  Result Value Ref Range   Specimen Description BLOOD RIGHT HAND    Special Requests      BOTTLES DRAWN AEROBIC AND ANAEROBIC ANA14ML AER11ML   Culture NO GROWTH 2 DAYS    Report Status PENDING   CULTURE, BLOOD (ROUTINE X 2) w Reflex to ID Panel     Status: None (Preliminary result)   Collection Time: 01/02/17  3:27 PM  Result Value Ref Range   Specimen Description BLOOD LEFT ASSIST CONTROL    Special Requests BOTTLES DRAWN AEROBIC AND ANAEROBIC AER9ML ANA8ML    Culture NO GROWTH 2 DAYS    Report Status PENDING   Glucose, capillary     Status: Abnormal   Collection Time: 01/02/17  4:21 PM  Result Value Ref Range   Glucose-Capillary 128 (H) 65 - 99 mg/dL  Glucose, capillary     Status: Abnormal   Collection Time: 01/02/17  8:11 PM  Result Value Ref Range   Glucose-Capillary 135 (H) 65 - 99 mg/dL  Glucose, capillary     Status: Abnormal   Collection Time: 01/02/17 10:27 PM  Result Value Ref Range   Glucose-Capillary 143 (H) 65 - 99 mg/dL  Basic metabolic panel     Status: Abnormal   Collection Time: 01/03/17  4:43 AM  Result Value Ref Range   Sodium 131 (L) 135 - 145 mmol/L   Potassium 4.1 3.5 - 5.1 mmol/L   Chloride 97 (L) 101 - 111 mmol/L   CO2 29 22 - 32 mmol/L   Glucose, Bld 109 (H) 65 - 99 mg/dL   BUN 32 (H) 6 - 20 mg/dL   Creatinine, Ser 1.16 0.61 - 1.24  mg/dL   Calcium 8.2 (L) 8.9 - 10.3 mg/dL   GFR calc non Af Amer >60 >60 mL/min   GFR calc Af Amer >60 >60 mL/min    Comment: (NOTE) The eGFR has been calculated using the CKD EPI equation. This calculation has not been validated in all clinical situations. eGFR's persistently <60 mL/min signify possible Chronic Kidney Disease.    Anion gap 5 5 - 15  Magnesium     Status: None   Collection Time: 01/03/17  4:43 AM  Result Value Ref Range   Magnesium 1.7 1.7 - 2.4 mg/dL  Glucose, capillary  Status: Abnormal   Collection Time: 01/03/17  7:37 AM  Result Value Ref Range   Glucose-Capillary 118 (H) 65 - 99 mg/dL  Glucose, capillary     Status: Abnormal   Collection Time: 01/03/17 11:32 AM  Result Value Ref Range   Glucose-Capillary 182 (H) 65 - 99 mg/dL  Protein, pleural or peritoneal fluid     Status: None   Collection Time: 01/03/17  2:25 PM  Result Value Ref Range   Total protein, fluid <3.0 g/dL    Comment: (NOTE) No normal range established for this test Results should be evaluated in conjunction with serum values    Fluid Type-FTP CYTOPERI   Body fluid cell count with differential     Status: Abnormal   Collection Time: 01/03/17  2:25 PM  Result Value Ref Range   Fluid Type-FCT CYTOPERI    Color, Fluid YELLOW YELLOW   Appearance, Fluid HAZY (A) CLEAR   WBC, Fluid 73 cu mm   Neutrophil Count, Fluid 67 %   Lymphs, Fluid 11 %   Monocyte-Macrophage-Serous Fluid 17 %   Eos, Fluid 0 %   Other Cells, Fluid 5 %  Body fluid culture     Status: None (Preliminary result)   Collection Time: 01/03/17  2:25 PM  Result Value Ref Range   Specimen Description PERITONEAL    Special Requests NONE    Gram Stain      RARE WBC PRESENT,BOTH PMN AND MONONUCLEAR NO ORGANISMS SEEN    Culture      NO GROWTH < 24 HOURS Performed at [facility], [address], [place],  [address]    Report Status PENDING   Glucose, pleural or peritoneal fluid     Status: None    Collection Time: 01/03/17  2:25 PM  Result Value Ref Range   Glucose, Fluid 140 mg/dL    Comment: (NOTE) No normal range established for this test Results should be evaluated in conjunction with serum values    Fluid Type-FGLU CYTOPERI   Miscellaneous [facility] test (send-out)     Status: None   Collection Time: 01/03/17  2:25 PM  Result Value Ref Range   [facility] test code 19,[id number]    [facility] test name TOTAL PROTEIN    Misc [facility] result COMMENT     Comment: (NOTE) Test Ordered: [id number] Protein, Body Fluid Protein, Body Fluid            0.9              g/dL     BN   ________________________________________________________ :  Peritoneal  :       Pleural          :   Synovial     : :______________:________________________:________________: :              : Transudate :  Exudate  :                : :______________:____________:___________:________________: :  Not Estab.  :   <3 g/dL  :  >3 g/dL  :    <2.5 g/dL   : :______________:____________:___________:________________: The method performance specifications have not been established for this test in body fluid. The test result should be integrated into the clinical context for interpretation. The method performance specifications have not been established for this test in body fluid.  The test result should be integrated into the clinical context for interpretation. Performed At: [facility] [address] [place], [place] [id number]  Lindon Romp MD YS:[phone]   Glucose, capillary     Status: Abnormal   Collection Time: 01/03/17  4:45 PM  Result Value Ref Range   Glucose-Capillary 120 (H) 65 - 99 mg/dL  Glucose, capillary     Status: Abnormal   Collection Time: 01/03/17  9:22 PM  Result Value Ref Range   Glucose-Capillary 162 (H) 65 - 99 mg/dL  Glucose, capillary     Status: Abnormal   Collection Time: 01/04/17  7:31 AM  Result Value Ref Range   Glucose-Capillary 114 (H) 65 - 99 mg/dL  Ammonia     Status:  Abnormal   Collection Time: 01/04/17  8:47 AM  Result Value Ref Range   Ammonia <9 (L) 9 - 35 umol/L  Glucose, capillary     Status: Abnormal   Collection Time: 01/04/17 11:20 AM  Result Value Ref Range   Glucose-Capillary 121 (H) 65 - 99 mg/dL   No components found for: ESR, C REACTIVE PROTEIN MICRO: Recent Results (from the past 720 hour(s))  Body fluid culture     Status: None   Collection Time: 12/17/16  1:15 PM  Result Value Ref Range Status   Specimen Description PERITONEAL  Final   Special Requests NONE  Final   Gram Stain   Final    FEW WBC PRESENT,BOTH PMN AND MONONUCLEAR NO ORGANISMS SEEN    Culture   Final    No growth aerobically or anaerobically. Performed at [facility]    Report Status 12/20/2016 FINAL  Final  Culture, blood (routine x 2)     Status: Abnormal   Collection Time: 12/27/16 12:40 AM  Result Value Ref Range Status   Specimen Description BLOOD RIGHT HAND  Final   Special Requests   Final    BOTTLES DRAWN AEROBIC AND ANAEROBIC AERO5ML,ANAERO4ML   Culture  Setup Time   Final    GRAM POSITIVE RODS ANAEROBIC BOTTLE ONLY CRITICAL VALUE NOTED.  VALUE IS CONSISTENT WITH PREVIOUSLY REPORTED AND CALLED VALUE.    Culture (A)  Final    ANAEROBIC GRAM POSITIVE RODS UNABLE TO FURTHER IDENTIFY. CORRECTED RESULTS CALLED TO: A ROBINSON,RN AT 3235 01/03/17 BY L BENFIELD PREVIOUSLY REPORTED AS DIPTHEROIDS Performed at [facility], [place] [address], [place], [place] [address]    Report Status 01/03/2017 FINAL  Final  Culture, blood (routine x 2)     Status: Abnormal   Collection Time: 12/27/16 12:40 AM  Result Value Ref Range Status   Specimen Description BLOOD LEFT HAND  Final   Special Requests   Final    BOTTLES DRAWN AEROBIC AND ANAEROBIC AERO3ML,ANAERO10ML   Culture  Setup Time   Final    GRAM POSITIVE RODS ANAEROBIC BOTTLE ONLY CRITICAL RESULT CALLED TO, READ BACK BY AND VERIFIED WITH: HANK ZOMPA 12/29/16 0900 SGD    Culture (A)  Final     ANAEROBIC GRAM POSITIVE RODS UNABLE TO FURTHER IDENTIFY. Performed at [facility], [place] [address], [place], [place] [address]    Report Status 01/02/2017 FINAL  Final  Blood Culture ID Panel (Reflexed)     Status: None   Collection Time: 12/27/16 12:40 AM  Result Value Ref Range Status   Enterococcus species NOT DETECTED NOT DETECTED Final   Listeria monocytogenes NOT DETECTED NOT DETECTED Final   Staphylococcus species NOT DETECTED NOT DETECTED Final   Staphylococcus aureus NOT DETECTED NOT DETECTED Final   Streptococcus species NOT DETECTED NOT DETECTED Final   Streptococcus agalactiae  NOT DETECTED NOT DETECTED Final   Streptococcus pneumoniae NOT DETECTED NOT DETECTED Final   Streptococcus pyogenes NOT DETECTED NOT DETECTED Final   Acinetobacter baumannii NOT DETECTED NOT DETECTED Final   Enterobacteriaceae species NOT DETECTED NOT DETECTED Final   Enterobacter cloacae complex NOT DETECTED NOT DETECTED Final   Escherichia coli NOT DETECTED NOT DETECTED Final   Klebsiella oxytoca NOT DETECTED NOT DETECTED Final   Klebsiella pneumoniae NOT DETECTED NOT DETECTED Final   Proteus species NOT DETECTED NOT DETECTED Final   Serratia marcescens NOT DETECTED NOT DETECTED Final   Haemophilus influenzae NOT DETECTED NOT DETECTED Final   Neisseria meningitidis NOT DETECTED NOT DETECTED Final   Pseudomonas aeruginosa NOT DETECTED NOT DETECTED Final   Candida albicans NOT DETECTED NOT DETECTED Final   Candida glabrata NOT DETECTED NOT DETECTED Final   Candida krusei NOT DETECTED NOT DETECTED Final   Candida parapsilosis NOT DETECTED NOT DETECTED Final   Candida tropicalis NOT DETECTED NOT DETECTED Final  MRSA PCR Screening     Status: None   Collection Time: 12/27/16  6:30 AM  Result Value Ref Range Status   MRSA by PCR NEGATIVE NEGATIVE Final    Comment:        The GeneXpert MRSA Assay (FDA approved for NASAL specimens only), is one component of a comprehensive MRSA  colonization surveillance program. It is not intended to diagnose MRSA infection nor to guide or monitor treatment for MRSA infections.   Urine culture     Status: None   Collection Time: 12/27/16 11:32 AM  Result Value Ref Range Status   Specimen Description URINE, CLEAN CATCH  Final   Special Requests NONE  Final   Culture   Final    NO GROWTH Performed at [facility], [address], [place], [place] [address]    Report Status 12/28/2016 FINAL  Final  Aerobic/Anaerobic Culture (surgical/deep wound)     Status: None (Preliminary result)   Collection Time: 01/02/17 10:23 AM  Result Value Ref Range Status   Specimen Description DECUBITIS  Final   Special Requests NONE  Final   Gram Stain   Final    MODERATE WBC PRESENT, PREDOMINANTLY PMN FEW GRAM POSITIVE COCCI IN PAIRS FEW GRAM POSITIVE RODS RARE GRAM NEGATIVE RODS    Culture   Final    CULTURE REINCUBATED FOR BETTER GROWTH Performed at [facility], [place] [address], [place], [place] [address]    Report Status PENDING  Incomplete  C difficile quick scan w PCR reflex     Status: None   Collection Time: 01/02/17 11:24 AM  Result Value Ref Range Status   C Diff antigen NEGATIVE NEGATIVE Final   C Diff toxin NEGATIVE NEGATIVE Final   C Diff interpretation No C. difficile detected.  Final  CULTURE, BLOOD (ROUTINE X 2) w Reflex to ID Panel     Status: None (Preliminary result)   Collection Time: 01/02/17  3:26 PM  Result Value Ref Range Status   Specimen Description BLOOD RIGHT HAND  Final   Special Requests   Final    BOTTLES DRAWN AEROBIC AND ANAEROBIC ANA14ML AER11ML   Culture NO GROWTH 2 DAYS  Final   Report Status PENDING  Incomplete  CULTURE, BLOOD (ROUTINE X 2) w Reflex to ID Panel     Status: None (Preliminary result)   Collection Time: 01/02/17  3:27 PM  Result Value Ref Range Status   Specimen Description BLOOD LEFT ASSIST CONTROL  Final  Special Requests BOTTLES DRAWN AEROBIC AND ANAEROBIC AER9ML  ANA8ML  Final   Culture NO GROWTH 2 DAYS  Final   Report Status PENDING  Incomplete  Body fluid culture     Status: None (Preliminary result)   Collection Time: 01/03/17  2:25 PM  Result Value Ref Range Status   Specimen Description PERITONEAL  Final   Special Requests NONE  Final   Gram Stain   Final    RARE WBC PRESENT,BOTH PMN AND MONONUCLEAR NO ORGANISMS SEEN    Culture   Final    NO GROWTH < 24 HOURS Performed at [facility], [place] [address] [place], [place] [address]    Report Status PENDING  Incomplete    IMAGING: Dg Chest 2 View  Result Date: 12/15/2016 CLINICAL DATA:  Swelling of the lower extremities over the last 2 weeks, low blood pressure, weakness EXAM: CHEST  2 VIEW COMPARISON:  Chest x-ray of 05/03/2014 and chest x-ray of 01/17/2013 FINDINGS: The lungs are clear but somewhat hyperaerated. There is no evidence of congestive heart failure. No effusion is seen. Mediastinal and hilar contours are unremarkable. Mild cardiomegaly is stable. There is slightly more compression of a lower thoracic vertebral body noted. IMPRESSION: 1. No evidence of congestive heart failure.  No pneumonia. 2. Slight hyper aeration. 3. Mild cardiomegaly. Electronically Signed   By: Ivar Drape M.D.   On: 12/15/2016 13:40   US Abdomen Complete  Result Date: 12/17/2016 CLINICAL DATA:  Cirrhosis EXAM: ABDOMEN ULTRASOUND COMPLETE COMPARISON:  None. FINDINGS: Gallbladder: Gallbladder wall is thickened to 5 mm likely related to the underlying ascites. No cholelithiasis is seen. Common bile duct: Diameter: 5 mm Liver: Diffusely heterogeneous with nodularity consistent with the underlying given clinical history of cirrhosis. IVC: No abnormality visualized. Pancreas: Not visualized. Spleen: Size and appearance within normal limits. Right Kidney: Length: 12.2 cm. Echogenicity within normal limits. No mass or hydronephrosis visualized. Left Kidney: Length: 11.2 cm. Echogenicity within normal limits. No mass  or hydronephrosis visualized. Abdominal aorta: Mild dilatation of the proximal aorta to 3 cm is noted. Other findings: Diffuse moderate ascites is noted. IMPRESSION: Changes consistent with cirrhosis of the liver with underlying ascites. Gallbladder wall thickening likely related to the underlying ascites. Proximal dilatation of the abdominal aorta. The distal aorta is not well visualized due to overlying bowel gas. Electronically Signed   By: Inez Catalina M.D.   On: 12/17/2016 09:09   Ct Abdomen Pelvis W Contrast  Result Date: 12/27/2016 CLINICAL DATA:  56 year old hypertensive male with bleeding decubitus ulcer presenting to emergency room with hypotension and tachycardia. Alcoholic cirrhosis. Congestive heart. Atrial fibrillation on anticoagulation. Subsequent encounter. EXAM: CT ABDOMEN AND PELVIS WITH CONTRAST TECHNIQUE: Multidetector CT imaging of the abdomen and pelvis was performed using the standard protocol following bolus administration of intravenous contrast. CONTRAST:  42m ISOVUE-300 IOPAMIDOL (ISOVUE-300) INJECTION 61% COMPARISON:  No comparison CT of the abdomen and pelvis. Ultrasound for paracentesis 12/17/2016. FINDINGS: Exam is motion degraded. Lower chest: Minimal lung base atelectasis. Aortic root and mitral valve calcification. Coronary artery calcification. Heart size top-normal. Hepatobiliary: Cirrhotic liver without mass identified on this motion degraded exam. Main portal vein appears patent. No calcified gallstone. Pancreas: Motion degraded exam without pancreatic mass identified. Splenic vein appears patent. Spleen: No splenic mass or enlargement. Adrenals/Urinary Tract: Left lower pole 4 mm nonobstructing stone. No hydronephrosis. 5 mm left renal cyst may be present but too small to adequately characterize. No worrisome renal or adrenal lesion. Stomach/Bowel: Evaluation limited by  prominent ascites and underdistention. Mild thickening of the antrum/probable may be related to under  distension rather than gastritis. Tiny duodenal diverticulum. Scattered colonic diverticula most notable descending colon Vascular/Lymphatic: Atherosclerotic changes aorta, iliac arteries and femoral arteries. Mild ectasia of the abdominal aorta without focal aneurysm. Narrowing aortic branch vessels without large vessel occlusion. Top-normal size lymph nodes most notable in inguinal region without adenopathy. Reproductive: Partial calcification prostate gland. Foley catheter in place with decompressed urinary bladder. Other: Prominent ascites and third spacing of fluid. Sacral decubitus ulcer with gas extending to the coccyx/lower sacrum. Inflammatory process/ gas extends into the right ischial rectal/ischial anal fossa with mild impression upon the right puborectalis muscle and minimal flattening of the right aspect of the rectum without clear fistula/communication with the rectum. Gas extends to the right operator internus muscle. Musculoskeletal: Remote T12 superior endplate compression fracture with anterior wedging. Gas within the T11-12 disc. Mild sclerosis upper sacrum. IMPRESSION: Cirrhosis, ascites and third spacing of fluid. Sacral decubitus ulcer with gas extending to the coccyx/lower sacrum. Inflammatory process/ gas extends into the right ischial rectal/ischial anal fossa with mild impression upon the right puborectalis muscle and minimal flattening of the right aspect of the rectum without clear fistula/communication with the rectum. Gas extends to the right operator internus muscle. Limited evaluation of bowel secondary to motion, ascites and underdistention. The slight thickening of the gastric antrum/ pylorus may be related to this underdistention but cannot exclude inflammation. Scattered colonic diverticula. Atherosclerotic changes as detailed above. Electronically Signed   By: Genia Del M.D.   On: 12/27/2016 09:27   US Venous Img Lower Bilateral  Result Date: 12/16/2016 CLINICAL DATA:   56 year old male with left greater than right lower extremity pitting edema EXAM: BILATERAL LOWER EXTREMITY VENOUS DOPPLER ULTRASOUND TECHNIQUE: Gray-scale sonography with graded compression, as well as color Doppler and duplex ultrasound were performed to evaluate the lower extremity deep venous systems from the level of the common femoral vein and including the common femoral, femoral, profunda femoral, popliteal and calf veins including the posterior tibial, peroneal and gastrocnemius veins when visible. The superficial great saphenous vein was also interrogated. Spectral Doppler was utilized to evaluate flow at rest and with distal augmentation maneuvers in the common femoral, femoral and popliteal veins. COMPARISON:  None. FINDINGS: RIGHT LOWER EXTREMITY Common Femoral Vein: No evidence of thrombus. Normal compressibility, respiratory phasicity and response to augmentation. Saphenofemoral Junction: No evidence of thrombus. Normal compressibility and flow on color Doppler imaging. Profunda Femoral Vein: No evidence of thrombus. Normal compressibility and flow on color Doppler imaging. Femoral Vein: No evidence of thrombus. Normal compressibility, respiratory phasicity and response to augmentation. Popliteal Vein: No evidence of thrombus. Normal compressibility, respiratory phasicity and response to augmentation. Calf Veins: No evidence of thrombus. Normal compressibility and flow on color Doppler imaging. Superficial Great Saphenous Vein: No evidence of thrombus. Normal compressibility and flow on color Doppler imaging. Venous Reflux:  None. Other Findings:  None. LEFT LOWER EXTREMITY Common Femoral Vein: No evidence of thrombus. Normal compressibility, respiratory phasicity and response to augmentation. Saphenofemoral Junction: No evidence of thrombus. Normal compressibility and flow on color Doppler imaging. Profunda Femoral Vein: No evidence of thrombus. Normal compressibility and flow on color Doppler  imaging. Femoral Vein: No evidence of thrombus. Normal compressibility, respiratory phasicity and response to augmentation. Popliteal Vein: No evidence of thrombus. Normal compressibility, respiratory phasicity and response to augmentation. Calf Veins: No evidence of thrombus. Normal compressibility and flow on color Doppler imaging. Superficial Great Saphenous Vein: No evidence  of thrombus. Normal compressibility and flow on color Doppler imaging. Venous Reflux:  None. Other Findings:  None. IMPRESSION: No evidence of deep venous thrombosis. Electronically Signed   By: Jacqulynn Cadet M.D.   On: 12/16/2016 15:04   US Paracentesis  Result Date: 01/03/2017 INDICATION: Cirrhosis and ascites. EXAM: ULTRASOUND GUIDED PARACENTESIS MEDICATIONS: None. COMPLICATIONS: None immediate. PROCEDURE: Informed written consent was obtained from the patient after a discussion of the risks, benefits and alternatives to treatment. A timeout was performed prior to the initiation of the procedure. Initial ultrasound was used to localize ascites. The right lower abdomen was prepped and draped in the usual sterile fashion. 1% lidocaine was used for local anesthesia. Following this, a 6 Fr Safe-T-Centesis catheter was introduced. An ultrasound image was saved for documentation purposes. The paracentesis was performed. The catheter was removed and a dressing was applied. The patient tolerated the procedure well without immediate post procedural complication. FINDINGS: A total of approximately 13.8 L of yellow fluid was removed. IMPRESSION: Successful ultrasound-guided paracentesis yielding 13.8 liters of peritoneal fluid. Electronically Signed   By: Aletta Edouard M.D.   On: 01/03/2017 17:01   US Paracentesis  Result Date: 12/17/2016 INDICATION: 56 year old with ascites. EXAM: ULTRASOUND GUIDED PARACENTESIS MEDICATIONS: None. COMPLICATIONS: None immediate. PROCEDURE: Informed written consent was obtained from the patient after a  discussion of the risks, benefits and alternatives to treatment. A timeout was performed prior to the initiation of the procedure. Initial ultrasound scanning demonstrates a large amount of ascites within the right lower abdominal quadrant. The right lower abdomen was prepped and draped in the usual sterile fashion. 1% lidocaine was used for local anesthesia. Following this, a 6 Fr Safe-T-Centesis catheter was introduced. An ultrasound image was saved for documentation purposes. The paracentesis was performed. The catheter was removed and a dressing was applied. The patient tolerated the procedure well without immediate post procedural complication. FINDINGS: A total of approximately 6.2 L of yellow fluid was removed. Samples were sent to the laboratory as requested by the clinical team. IMPRESSION: Successful ultrasound-guided paracentesis yielding 6.2 liters of peritoneal fluid. Electronically Signed   By: Markus Daft M.D.   On: 12/17/2016 14:28   Dg Chest Portable 1 View  Result Date: 12/27/2016 CLINICAL DATA:  Central line placement EXAM: PORTABLE CHEST 1 VIEW COMPARISON:  Chest radiograph 12/26/2016 FINDINGS: Right subclavian approach central venous catheter tip overlies the lower SVC. The remainder the examination is unchanged. IMPRESSION: Right subclavian central venous catheter tip overlying the lower superior vena cava. Electronically Signed   By: Ulyses Jarred M.D.   On: 12/27/2016 05:18   Dg Chest Portable 1 View  Result Date: 12/27/2016 CLINICAL DATA:  Cough EXAM: PORTABLE CHEST 1 VIEW COMPARISON:  Chest radiograph 12/15/2016 FINDINGS: Unchanged cardiomegaly. No focal airspace consolidation or pulmonary edema. No pneumothorax or sizable pleural effusion. IMPRESSION: Unchanged cardiomegaly without overt pulmonary edema. Electronically Signed   By: Ulyses Jarred M.D.   On: 12/27/2016 00:10    Assessment:   [name] is a 56 y.o. male admitted with bleeding per rectum and bleeding sacral  wound as well as fever, leukocytosis and bcx + anaerobic gram positive rods in 2/2 . Has been seen by surgery and wound care. CT shows inflammation down to coccyx and lower sacrum.  The wound seems to have worsened since last seen by surgery and wound care and now has odor, drainage and extensive necrotic tissue. Clinically stable however. Has advanced cirrhosis with ascites.  I discussed the case with  surgery - Dr Adonis Huguenin and with case manager.  Recommendations Will need IV zosyn for minimal 6 week course. Place picc- discussed with patient. Would best be served by transfer to [facility] for wound care, debridement and IV abx  Thank you very much for allowing me to participate in the care of this patient. Please call with questions.   Cheral Marker. Ola Spurr, MD

## 2017-01-04 NOTE — Consult Note (Signed)
WOC Nurse wound follow up Reason for Consult: Unstageable pressure injury to coccyx, bilateral buttocks.  We have implemented Dakin's moist gauze for debridement and odor control.  Little debridement has occurred.  Albumin 1.7.  May discharge to SNF soon.  Chronic skin changes to bilateral lower extremities.  Will moisturize daily.   Wound type:Unstageable pressure injury.not a surgical candidate at this time.  Pressure Injury POA: Yes Measurement: Coccyx:  4 cm x 3.4 cm 90% necrotic wound bed.  Right buttocks 2 cm x 2 cm 90% necrotic wound bed.   Wound bed:90% necrotic with foul smelling, moderate, purulent drainage.    Periwound:2 areas of medical adhesive related skin injury (MARSI) to periwound.  2 cm x 1 cm x 0.1 cm  Dressing procedure/placement/frequency:Cleanse sacral and buttocks wounds with NS.  Gently fill necrotic wound with Dakin's moistened kerlix.  Cover with 4x4 gauze and ABD pad/tape.  Change twice daily.  Cover periwound denuded skin with silicone border foam dressings.  Change daily.  Turn and reposition every two hours.   Will need mattress replacement with low air loss feature when transferred to the floor.   WOC team will follow.  Maple HudsonKaren Rameen Quinney RN BSN CWON Pager [phone]

## 2017-01-04 NOTE — Progress Notes (Signed)
Infectious Disease Long Term IV Antibiotic Orders  Diagnosis: necrotic sacral decubitus ulcer, cirrhosis  Culture results Pending   Allergies:  Allergies  Allergen Reactions  . Clindamycin/Lincomycin Diarrhea    Discharge antibiotics Zosyn 3.375 grams every 8 hours PICC Care per protocol Labs weekly while on IV antibiotics      CBC w diff   Comprehensive met panel ESR CRP     Planned duration of antibiotics 6 weeks   Stop date March 6th  Follow up clinic date Transfer to [place] weekly labs to [phone]  Leonel Ramsay, MD

## 2017-01-04 NOTE — Progress Notes (Signed)
Patient ID: [name], male   DOB: 01-Jun-1961, 56 y.o.   MRN: [id number]   [employer] PROGRESS NOTE  [name] WGN:[id number] DOB: 01-Jun-1961 DOA: 12/26/2016 PCP: Corky DownsMASOUD,[name], MD  HPI/Subjective: Patient still feels weak but trying to work with physical therapy. Yesterday had 13 L taken off with paracentesis  Objective: Vitals:   01/04/17 1354 01/04/17 1355  BP: (!) 89/61 93/67  Pulse: 65 65  Resp: 18   Temp: 97.9 F (36.6 C)     Filed Weights   01/01/17 0500 01/02/17 0500 01/03/17 0556  Weight: (!) 148.8 kg (328 lb) (!) 147.4 kg (325 lb) (!) 142.9 kg (315 lb)    ROS: Review of Systems  Constitutional: Negative for chills and fever.  Eyes: Negative for blurred vision.  Respiratory: Negative for cough and shortness of breath.   Cardiovascular: Negative for chest pain.  Gastrointestinal: Negative for abdominal pain, constipation, diarrhea, nausea and vomiting.  Genitourinary: Negative for dysuria.  Musculoskeletal: Negative for joint pain.  Neurological: Positive for weakness. Negative for dizziness and headaches.   Exam: Physical Exam  Constitutional: He is oriented to person, place, and time.  HENT:  Nose: No mucosal edema.  Mouth/Throat: No oropharyngeal exudate or posterior oropharyngeal edema.  Eyes: Conjunctivae, EOM and lids are normal. Pupils are equal, round, and reactive to light.  Neck: No JVD present. Carotid bruit is not present. No edema present. No thyroid mass and no thyromegaly present.  Cardiovascular: S1 normal and S2 normal.  An irregularly irregular rhythm present. Exam reveals no gallop.   No murmur heard. Pulses:      Dorsalis pedis pulses are 2+ on the right side, and 2+ on the left side.  Respiratory: No respiratory distress. He has decreased breath sounds in the right lower field. He has no wheezes. He has no rhonchi. He has no rales.  GI: Soft. Bowel sounds are normal. He exhibits distension and fluid wave. There is no  tenderness.  Genitourinary:  Genitourinary Comments: 4+ scrotal and penis swelling  Musculoskeletal:       Right ankle: He exhibits swelling.       Left ankle: He exhibits swelling.  Lymphadenopathy:    He has no cervical adenopathy.  Neurological: He is alert and oriented to person, place, and time. No cranial nerve deficit.  Skin: Skin is warm. Nails show no clubbing.  Large sacral decubitus requiring packing with some necrotic material surrounding. Patient has 2 superficial areas of skin breakdown surrounding this.  Psychiatric: He has a normal mood and affect.      Data Reviewed: Basic Metabolic Panel:  Recent Labs Lab 12/29/16 0532 12/30/16 0456 12/31/16 0526 01/01/17 1229 01/02/17 0642 01/03/17 0443  NA 131* 131* 129* 127* 129* 131*  K 4.1 3.8 4.0 4.4 4.2 4.1  CL 99* 99* 96* 95* 95* 97*  CO2 27 27 28 26 29 29   GLUCOSE 141* 159* 147* 164* 127* 109*  BUN 34* 32* 30* 33* 32* 32*  CREATININE 1.33* 1.29* 1.16 1.24 1.11 1.16  CALCIUM 8.2* 7.8* 8.0* 8.0* 8.0* 8.2*  MG 1.7 1.8  --  1.7 1.8 1.7   Liver Function Tests:  Recent Labs Lab 01/02/17 0642  ALBUMIN 1.7*    Recent Labs Lab 12/30/16 1734 01/04/17 0847  AMMONIA 44* <9*   CBC:  Recent Labs Lab 12/29/16 0532 12/30/16 0456 12/31/16 0526 01/02/17 0642  WBC 18.7* 17.2* 14.0* 13.1*  HGB 8.4* 8.8* 9.4* 9.4*  HCT 25.3* 25.5* 26.9* 27.7*  MCV 101.9*  101.3* 99.1 100.2*  PLT 116* 108* 120* 153    CBG:  Recent Labs Lab 01/03/17 1132 01/03/17 1645 01/03/17 2122 01/04/17 0731 01/04/17 1120  GLUCAP 182* 120* 162* 114* 121*    Recent Results (from the past 240 hour(s))  Culture, blood (routine x 2)     Status: Abnormal   Collection Time: 12/27/16 12:40 AM  Result Value Ref Range Status   Specimen Description BLOOD RIGHT HAND  Final   Special Requests   Final    BOTTLES DRAWN AEROBIC AND ANAEROBIC AERO5ML,ANAERO4ML   Culture  Setup Time   Final    GRAM POSITIVE RODS ANAEROBIC BOTTLE  ONLY CRITICAL VALUE NOTED.  VALUE IS CONSISTENT WITH PREVIOUSLY REPORTED AND CALLED VALUE.    Culture (A)  Final    ANAEROBIC GRAM POSITIVE RODS UNABLE TO FURTHER IDENTIFY. CORRECTED RESULTS CALLED TO: A ROBINSON,RN AT 1610 01/03/17 BY L BENFIELD PREVIOUSLY REPORTED AS DIPTHEROIDS Performed at [facility] Lab, [address] [place], [place] [address]    Report Status 01/03/2017 FINAL  Final  Culture, blood (routine x 2)     Status: Abnormal   Collection Time: 12/27/16 12:40 AM  Result Value Ref Range Status   Specimen Description BLOOD LEFT HAND  Final   Special Requests   Final    BOTTLES DRAWN AEROBIC AND ANAEROBIC AERO3ML,ANAERO10ML   Culture  Setup Time   Final    GRAM POSITIVE RODS ANAEROBIC BOTTLE ONLY CRITICAL RESULT CALLED TO, READ BACK BY AND VERIFIED WITH: HANK ZOMPA 12/29/16 0900 SGD    Culture (A)  Final    ANAEROBIC GRAM POSITIVE RODS UNABLE TO FURTHER IDENTIFY. Performed at [facility] Lab, [address] [place], [place] [address]    Report Status 01/02/2017 FINAL  Final  Blood Culture ID Panel (Reflexed)     Status: None   Collection Time: 12/27/16 12:40 AM  Result Value Ref Range Status   Enterococcus species NOT DETECTED NOT DETECTED Final   Listeria monocytogenes NOT DETECTED NOT DETECTED Final   Staphylococcus species NOT DETECTED NOT DETECTED Final   Staphylococcus aureus NOT DETECTED NOT DETECTED Final   Streptococcus species NOT DETECTED NOT DETECTED Final   Streptococcus agalactiae NOT DETECTED NOT DETECTED Final   Streptococcus pneumoniae NOT DETECTED NOT DETECTED Final   Streptococcus pyogenes NOT DETECTED NOT DETECTED Final   Acinetobacter baumannii NOT DETECTED NOT DETECTED Final   Enterobacteriaceae species NOT DETECTED NOT DETECTED Final   Enterobacter cloacae complex NOT DETECTED NOT DETECTED Final   Escherichia coli NOT DETECTED NOT DETECTED Final   Klebsiella oxytoca NOT DETECTED NOT DETECTED Final   Klebsiella pneumoniae NOT DETECTED  NOT DETECTED Final   Proteus species NOT DETECTED NOT DETECTED Final   Serratia marcescens NOT DETECTED NOT DETECTED Final   Haemophilus influenzae NOT DETECTED NOT DETECTED Final   Neisseria meningitidis NOT DETECTED NOT DETECTED Final   Pseudomonas aeruginosa NOT DETECTED NOT DETECTED Final   Candida albicans NOT DETECTED NOT DETECTED Final   Candida glabrata NOT DETECTED NOT DETECTED Final   Candida krusei NOT DETECTED NOT DETECTED Final   Candida parapsilosis NOT DETECTED NOT DETECTED Final   Candida tropicalis NOT DETECTED NOT DETECTED Final  MRSA PCR Screening     Status: None   Collection Time: 12/27/16  6:30 AM  Result Value Ref Range Status   MRSA by PCR NEGATIVE NEGATIVE Final    Comment:        The GeneXpert MRSA Assay (FDA approved for NASAL specimens  only), is one component of a comprehensive MRSA colonization surveillance program. It is not intended to diagnose MRSA infection nor to guide or monitor treatment for MRSA infections.   Urine culture     Status: None   Collection Time: 12/27/16 11:32 AM  Result Value Ref Range Status   Specimen Description URINE, CLEAN CATCH  Final   Special Requests NONE  Final   Culture   Final    NO GROWTH Performed at [facility] Lab, [address], [place], [place] [address]    Report Status 12/28/2016 FINAL  Final  Aerobic/Anaerobic Culture (surgical/deep wound)     Status: None (Preliminary result)   Collection Time: 01/02/17 10:23 AM  Result Value Ref Range Status   Specimen Description DECUBITIS  Final   Special Requests NONE  Final   Gram Stain   Final    MODERATE WBC PRESENT, PREDOMINANTLY PMN FEW GRAM POSITIVE COCCI IN PAIRS FEW GRAM POSITIVE RODS RARE GRAM NEGATIVE RODS    Culture   Final    CULTURE REINCUBATED FOR BETTER GROWTH Performed at [facility] Lab, [address] [place], [place] [address]    Report Status PENDING  Incomplete  C difficile quick scan w PCR reflex     Status: None   Collection  Time: 01/02/17 11:24 AM  Result Value Ref Range Status   C Diff antigen NEGATIVE NEGATIVE Final   C Diff toxin NEGATIVE NEGATIVE Final   C Diff interpretation No C. difficile detected.  Final  CULTURE, BLOOD (ROUTINE X 2) w Reflex to ID Panel     Status: None (Preliminary result)   Collection Time: 01/02/17  3:26 PM  Result Value Ref Range Status   Specimen Description BLOOD RIGHT HAND  Final   Special Requests   Final    BOTTLES DRAWN AEROBIC AND ANAEROBIC ANA14ML AER11ML   Culture NO GROWTH 2 DAYS  Final   Report Status PENDING  Incomplete  CULTURE, BLOOD (ROUTINE X 2) w Reflex to ID Panel     Status: None (Preliminary result)   Collection Time: 01/02/17  3:27 PM  Result Value Ref Range Status   Specimen Description BLOOD LEFT ASSIST CONTROL  Final   Special Requests BOTTLES DRAWN AEROBIC AND ANAEROBIC AER9ML ANA8ML  Final   Culture NO GROWTH 2 DAYS  Final   Report Status PENDING  Incomplete  Body fluid culture     Status: None (Preliminary result)   Collection Time: 01/03/17  2:25 PM  Result Value Ref Range Status   Specimen Description PERITONEAL  Final   Special Requests NONE  Final   Gram Stain   Final    RARE WBC PRESENT,BOTH PMN AND MONONUCLEAR NO ORGANISMS SEEN    Culture   Final    NO GROWTH < 24 HOURS Performed at [facility] Lab, [address], [place], [place] [address]    Report Status PENDING  Incomplete      Scheduled Meds: . ammonium lactate   Topical Daily  . citalopram  10 mg Oral QHS  . diphenhydrAMINE  12.5 mg Intravenous Once  . folic acid  1 mg Oral Daily  . furosemide  40 mg Intravenous Daily  . multivitamin with minerals  1 tablet Oral Daily  . pneumococcal 23 valent vaccine  0.5 mL Intramuscular Tomorrow-1000  . potassium chloride  20 mEq Oral Daily  . protein supplement shake  11 oz Oral BID BM  . sodium chloride flush  3 mL Intravenous Q12H  .  sotalol  80 mg Oral BID    Assessment/Plan:   1. Hemorrhagic shock due to bleeding sacral  ulcer while on anticoagulation. Acute hemorrhagic anemia. Blood pressure still on the lower side. Hemoglobin stabilized. 2. Infected decubitus ulcer Which is at least a stage III. On Zosyn.  Patient seen by Dr. Sampson Goon and he recommended a 6 week course of IV Zosyn. Hopefully patient will be approved for LTAC facility with insurance. Continue local wound care. 3. Acute kidney injury on chronic kidney disease stage II continue to monitor with diuresis 4. Hyponatremia and anasarca. Continue IV Lasix daily. Blood pressure little bit too low for Aldactone at this point. 5. Chronic atrial fibrillation on sotalol. Hold anticoagulation secondary to bleeding.  6. History of chronic diastolic congestive heart failure. IV Lasix for anasarca. 7. Alcoholic liver cirrhosis with ascites. Second paracentesis on this hospitalization done yesterday with greater than 13 L taken off. 8. Weakness. Physical therapy recommends rehabilitation.   Code Status:     Code Status Orders        Start     Ordered   12/27/16 9604  Full code  Continuous     12/27/16 0632    Code Status History    Date Active Date Inactive Code Status Order ID Comments User Context   12/15/2016  8:06 PM 12/21/2016  4:29 PM Full Code [id number]  Altamese Dilling, MD ED   04/17/2016  7:26 PM 04/19/2016  5:50 PM Full Code [id number]  Wyatt Haste, MD ED     Family Communication: Spoke with girlfriend At the bedside  Disposition Plan: Hopefully insurance will cover LTAC facility  Consultants:  Psychiatry  Gen. Surgery  Gastroenterology  Antibiotics:  Zosyn  Time spent: 25 minutes  Alford Highland  [facility] [employer]

## 2017-01-04 NOTE — Care Management (Signed)
[facility] has [facility].  [name] from [facility] notified. To start insurance auth.

## 2017-01-04 NOTE — Care Management (Signed)
RNCM consult for LTACH.  Discussed option of LTACH with patient.  Patient in agreement to pursue LTACH.  [facility], and Albin FellingCarla from [facility] notified to review case

## 2017-01-04 NOTE — Progress Notes (Signed)
Nutrition Follow-up  DOCUMENTATION CODES:   Obesity unspecified  INTERVENTION:  Continue Premier Protein po BID, each supplement provides 160 kcal and 30 grams of protein.  Continue double protein portions.  Encouraged adequate intake of calories and protein with meals and beverages for wound healing.   Provide multivitamin with minerals daily.   NUTRITION DIAGNOSIS:   Increased nutrient needs related to wound healing as evidenced by estimated needs.  Ongoing.   GOAL:   Patient will meet greater than or equal to 90% of their needs  Progressing.  MONITOR:   PO intake, Supplement acceptance, Skin  REASON FOR ASSESSMENT:   Rounds    ASSESSMENT:   56 yr old here with Encephalopathy, Sepsis and EtOH cirrhosis with sacral decub.  Patient confused and poor historian but will follow comands.  He states pain in the rectum area and that he thought he had an accident there.   Spoke with patient at bedside. He reports his appetite is good. Denies N/V or abdominal pain. Per family member present at bedside patient eats better during the meals he has assistance with.   Meal Completion: 50-75% per chart. Patient also drank 2 Premier Protein per report. Patient has had approximately 1617 kcal (60% minimum estimated kcal needs) and 113 grams of protein (78% minimum estimated protein needs).   Medications reviewed and include: folic acid 1 mg daily, Lasix 40 mg daily, Novolog sliding scale TID with meals and daily at bedtime, potassium chloride 20 mEq daily.   Labs reviewed: CBG 120-162 past 24 hrs, Sodium 131, Chloride 97, BUN 32.   Discussed with RN.   Diet Order:  Diet Carb Modified Fluid consistency: Thin; Room service appropriate? Yes  Skin:     Last BM:  01/04/2017  Height:   Ht Readings from Last 1 Encounters:  12/27/16 6\' 4"  (1.93 m)    Weight:   Wt Readings from Last 1 Encounters:  01/03/17 (!) 315 lb (142.9 kg)    Ideal Body Weight:     BMI:  Body mass  index is 38.34 kg/m.  Estimated Nutritional Needs:   Kcal:  2700-2900kcal/day   Protein:  145-160g/day   Fluid:  >2.7L/day   EDUCATION NEEDS:   Education needs no appropriate at this time  Helane RimaLeanne Yomayra Tate, MS, RD, LDN Pager: [phone] After Hours Pager: [phone]

## 2017-01-04 NOTE — Progress Notes (Signed)
Physical Therapy Treatment Patient Details Name: [name] MRN: [id number] DOB: [birth year] Today's Date: 01/04/2017    History of Present Illness presented to ER secondary to rectal bleeding; admitted with septic shock secondary to necrotic sacral ulcer.    PT Comments    Pt more alert and responsive today, but stated he felt tired. Pt displayed increase in strength from previous treatments and was able to perform supine therex with min assist from PT . Pt also able to participate in bed mobility and required mod assist x2 and cuing to move to sitting at EOB. Pt seated balance improved from previous treatment and only required PT supervision. Pt able to transfer to standing with mod assist using RW . Pt transferred with flexed trunk but able to obtain upright posture with cuing. Able to take side steps with cuing and use of RW for stability. Overall activity limited by fatigue, and patient displays strength and balance deficits that limit safe household mobility. He will continue to benefit from skilled PT     Follow Up Recommendations  SNF     Equipment Recommendations  Rolling walker with 5" wheels    Recommendations for Other Services       Precautions / Restrictions Precautions Precautions: Fall Restrictions Weight Bearing Restrictions: No    Mobility  Bed Mobility Overal bed mobility: Needs Assistance Bed Mobility: Supine to Sit     Supine to sit: Mod assist;+2 for physical assistance Sit to supine: Mod assist;+2 for physical assistance   General bed mobility comments: pt able to participate and use handrails with mod assist for LE and trunk positioning  Transfers Overall transfer level: Needs assistance Equipment used: Rolling walker (2 wheeled) Transfers: Sit to/from Stand Sit to Stand: Mod assist         General transfer comment: forward flexed trunk when coming to standing; uses bilat UE for lift off  Ambulation/Gait Ambulation/Gait assistance:  Min assist;+2 safety/equipment Ambulation Distance (Feet): 2 Feet Assistive device: Rolling walker (2 wheeled) Gait Pattern/deviations: Step-to pattern;Shuffle;Trunk flexed     General Gait Details: side steped to move over to top of bed, small step length   Stairs            Wheelchair Mobility    Modified Rankin (Stroke Patients Only)       Balance Overall balance assessment: Needs assistance Sitting-balance support: Feet supported Sitting balance-Leahy Scale: Fair Sitting balance - Comments: forward flexed trunk, cuing for upright posture    Standing balance support: Bilateral upper extremity supported Standing balance-Leahy Scale: Fair Standing balance comment: Requires RW during stance, poor standing endurance                     Cognition Arousal/Alertness: Awake/alert Behavior During Therapy: WFL for tasks assessed/performed Overall Cognitive Status: Within Functional Limits for tasks assessed                 General Comments: alert today and responsive     Exercises Other Exercises Other Exercises: transfer training; sit to stand x3 w/ RW and mod assist x2 to improve functional mobility  Other Exercises: Supine therex, AAROM 1x12; w/ 20% PT assist; ankle pumps; SLR; heel slides; hip abd. To improve overall strength     General Comments        Pertinent Vitals/Pain Pain Assessment: Faces Faces Pain Scale: Hurts little more Pain Descriptors / Indicators: Aching;Grimacing Pain Intervention(s): Monitored during session;Repositioned    Home Living  Prior Function            PT Goals (current goals can now be found in the care plan section) Acute Rehab PT Goals Patient Stated Goal: return home PT Goal Formulation: With patient Time For Goal Achievement: 01/13/17 Potential to Achieve Goals: Fair Progress towards PT goals: Progressing toward goals    Frequency    Min 2X/week      PT Plan Current  plan remains appropriate    Co-evaluation             End of Session Equipment Utilized During Treatment: Gait belt Activity Tolerance: Patient limited by fatigue Patient left: in bed;with call bell/phone within reach;with bed alarm set;with family/visitor present     Time: [id number] PT Time Calculation (min) (ACUTE ONLY): 30 min  Charges:                       G Codes:      Markhi Kleckner 01/04/2017, 11:04 AM

## 2017-01-05 LAB — BASIC METABOLIC PANEL
Anion gap: 4 — ABNORMAL LOW (ref 5–15)
BUN: 25 mg/dL — ABNORMAL HIGH (ref 6–20)
CALCIUM: 8 mg/dL — AB (ref 8.9–10.3)
CHLORIDE: 97 mmol/L — AB (ref 101–111)
CO2: 31 mmol/L (ref 22–32)
Creatinine, Ser: 0.93 mg/dL (ref 0.61–1.24)
Glucose, Bld: 127 mg/dL — ABNORMAL HIGH (ref 65–99)
Potassium: 3.9 mmol/L (ref 3.5–5.1)
SODIUM: 132 mmol/L — AB (ref 135–145)

## 2017-01-05 LAB — CBC
HCT: 27.2 % — ABNORMAL LOW (ref 40.0–52.0)
Hemoglobin: 9.4 g/dL — ABNORMAL LOW (ref 13.0–18.0)
MCH: 34.7 pg — ABNORMAL HIGH (ref 26.0–34.0)
MCHC: 34.4 g/dL (ref 32.0–36.0)
MCV: 100.9 fL — ABNORMAL HIGH (ref 80.0–100.0)
PLATELETS: 128 10*3/uL — AB (ref 150–440)
RBC: 2.7 MIL/uL — ABNORMAL LOW (ref 4.40–5.90)
RDW: 18 % — ABNORMAL HIGH (ref 11.5–14.5)
WBC: 9.8 10*3/uL (ref 3.8–10.6)

## 2017-01-05 LAB — CYTOLOGY - NON PAP

## 2017-01-05 LAB — MAGNESIUM: MAGNESIUM: 1.7 mg/dL (ref 1.7–2.4)

## 2017-01-05 MED ORDER — DAKINS (1/4 STRENGTH) 0.125 % EX SOLN
Freq: Two times a day (BID) | CUTANEOUS | Status: AC
  Administered 2017-01-05 – 2017-01-08 (×7)
  Filled 2017-01-05: qty 473

## 2017-01-05 MED ORDER — MAGNESIUM SULFATE 2 GM/50ML IV SOLN
2.0000 g | Freq: Once | INTRAVENOUS | Status: AC
  Administered 2017-01-05: 2 g via INTRAVENOUS
  Filled 2017-01-05: qty 50

## 2017-01-05 MED ORDER — PIPERACILLIN-TAZOBACTAM 3.375 G IVPB
3.3750 g | Freq: Three times a day (TID) | INTRAVENOUS | Status: DC
  Administered 2017-01-05 – 2017-01-14 (×27): 3.375 g via INTRAVENOUS
  Filled 2017-01-05 (×27): qty 50

## 2017-01-05 NOTE — Progress Notes (Signed)
MEDICATION RELATED CONSULT NOTE  Pharmacy Consult for assisting in electrolyte monitoring/replacement  Assessment: 55yoM on home apixaban, who presents with sacral bleeding given KCENTRA. Pt found to be septic. Pharmacy consulted for electrolyte monitoring. Patient receiving potassium PO daily.   Plan:  1. 1/24 am K=3.9 mag 1.7  Patient on KCL PO 20 MEQ daily and furosemide 40 IV daily. Will give 2g IV mag x 1  Will continue follow electrolytes  Allergies  Allergen Reactions  . Clindamycin/Lincomycin Diarrhea    Patient Measurements: Height: 6\' 4"  (193 cm) Weight: (!) 315 lb (142.9 kg) IBW/kg (Calculated) : 86.8  Vital Signs: Temp: 97.5 F (36.4 C) (01/24 0600) Temp Source: Oral (01/24 0600) BP: 110/82 (01/24 0600) Pulse Rate: 76 (01/24 0600) Intake/Output from previous day: 01/23 0701 - 01/24 0700 In: 163 [P.O.:160; I.V.:3] Out: 275 [Urine:275] Intake/Output from this shift: No intake/output data recorded.  Labs:  Recent Labs  01/03/17 0443 01/05/17 0424  WBC  --  9.8  HGB  --  9.4*  HCT  --  27.2*  PLT  --  128*  CREATININE 1.16 0.93  MG 1.7 1.7   Estimated Creatinine Clearance: 137 mL/min (by C-G formula based on SCr of 0.93 mg/dL).   Microbiology: Recent Results (from the past 720 hour(s))  Body fluid culture     Status: None   Collection Time: 12/17/16  1:15 PM  Result Value Ref Range Status   Specimen Description PERITONEAL  Final   Special Requests NONE  Final   Gram Stain   Final    FEW WBC PRESENT,BOTH PMN AND MONONUCLEAR NO ORGANISMS SEEN    Culture   Final    No growth aerobically or anaerobically. Performed at [facility]    Report Status 12/20/2016 FINAL  Final  Culture, blood (routine x 2)     Status: Abnormal   Collection Time: 12/27/16 12:40 AM  Result Value Ref Range Status   Specimen Description BLOOD RIGHT HAND  Final   Special Requests   Final    BOTTLES DRAWN AEROBIC AND ANAEROBIC AERO5ML,ANAERO4ML   Culture   Setup Time   Final    GRAM POSITIVE RODS ANAEROBIC BOTTLE ONLY CRITICAL VALUE NOTED.  VALUE IS CONSISTENT WITH PREVIOUSLY REPORTED AND CALLED VALUE.    Culture (A)  Final    ANAEROBIC GRAM POSITIVE RODS UNABLE TO FURTHER IDENTIFY. CORRECTED RESULTS CALLED TO: A ROBINSON,RN AT 7564 01/03/17 BY L BENFIELD PREVIOUSLY REPORTED AS DIPTHEROIDS Performed at [facility], [address], [place], [place] [address]    Report Status 01/03/2017 FINAL  Final  Culture, blood (routine x 2)     Status: Abnormal   Collection Time: 12/27/16 12:40 AM  Result Value Ref Range Status   Specimen Description BLOOD LEFT HAND  Final   Special Requests   Final    BOTTLES DRAWN AEROBIC AND ANAEROBIC AERO3ML,ANAERO10ML   Culture  Setup Time   Final    GRAM POSITIVE RODS ANAEROBIC BOTTLE ONLY CRITICAL RESULT CALLED TO, READ BACK BY AND VERIFIED WITH: HANK ZOMPA 12/29/16 0900 SGD    Culture (A)  Final    ANAEROBIC GRAM POSITIVE RODS UNABLE TO FURTHER IDENTIFY. Performed at [facility], [address] [place], [place] [address]    Report Status 01/02/2017 FINAL  Final  Blood Culture ID Panel (Reflexed)     Status: None   Collection Time: 12/27/16 12:40 AM  Result Value Ref Range Status   Enterococcus species NOT DETECTED NOT  DETECTED Final   Listeria monocytogenes NOT DETECTED NOT DETECTED Final   Staphylococcus species NOT DETECTED NOT DETECTED Final   Staphylococcus aureus NOT DETECTED NOT DETECTED Final   Streptococcus species NOT DETECTED NOT DETECTED Final   Streptococcus agalactiae NOT DETECTED NOT DETECTED Final   Streptococcus pneumoniae NOT DETECTED NOT DETECTED Final   Streptococcus pyogenes NOT DETECTED NOT DETECTED Final   Acinetobacter baumannii NOT DETECTED NOT DETECTED Final   Enterobacteriaceae species NOT DETECTED NOT DETECTED Final   Enterobacter cloacae complex NOT DETECTED NOT DETECTED Final   Escherichia coli NOT DETECTED NOT DETECTED Final   Klebsiella oxytoca NOT  DETECTED NOT DETECTED Final   Klebsiella pneumoniae NOT DETECTED NOT DETECTED Final   Proteus species NOT DETECTED NOT DETECTED Final   Serratia marcescens NOT DETECTED NOT DETECTED Final   Haemophilus influenzae NOT DETECTED NOT DETECTED Final   Neisseria meningitidis NOT DETECTED NOT DETECTED Final   Pseudomonas aeruginosa NOT DETECTED NOT DETECTED Final   Candida albicans NOT DETECTED NOT DETECTED Final   Candida glabrata NOT DETECTED NOT DETECTED Final   Candida krusei NOT DETECTED NOT DETECTED Final   Candida parapsilosis NOT DETECTED NOT DETECTED Final   Candida tropicalis NOT DETECTED NOT DETECTED Final  MRSA PCR Screening     Status: None   Collection Time: 12/27/16  6:30 AM  Result Value Ref Range Status   MRSA by PCR NEGATIVE NEGATIVE Final    Comment:        The GeneXpert MRSA Assay (FDA approved for NASAL specimens only), is one component of a comprehensive MRSA colonization surveillance program. It is not intended to diagnose MRSA infection nor to guide or monitor treatment for MRSA infections.   Urine culture     Status: None   Collection Time: 12/27/16 11:32 AM  Result Value Ref Range Status   Specimen Description URINE, CLEAN CATCH  Final   Special Requests NONE  Final   Culture   Final    NO GROWTH Performed at [facility], [address] [place], [place] [address]    Report Status 12/28/2016 FINAL  Final  Aerobic/Anaerobic Culture (surgical/deep wound)     Status: None (Preliminary result)   Collection Time: 01/02/17 10:23 AM  Result Value Ref Range Status   Specimen Description DECUBITIS  Final   Special Requests NONE  Final   Gram Stain   Final    MODERATE WBC PRESENT, PREDOMINANTLY PMN FEW GRAM POSITIVE COCCI IN PAIRS FEW GRAM POSITIVE RODS RARE GRAM NEGATIVE RODS    Culture   Final    CULTURE REINCUBATED FOR BETTER GROWTH Performed at [facility], [address] [place], [place] [address]    Report Status PENDING  Incomplete   C difficile quick scan w PCR reflex     Status: None   Collection Time: 01/02/17 11:24 AM  Result Value Ref Range Status   C Diff antigen NEGATIVE NEGATIVE Final   C Diff toxin NEGATIVE NEGATIVE Final   C Diff interpretation No C. difficile detected.  Final  CULTURE, BLOOD (ROUTINE X 2) w Reflex to ID Panel     Status: None (Preliminary result)   Collection Time: 01/02/17  3:26 PM  Result Value Ref Range Status   Specimen Description BLOOD RIGHT HAND  Final   Special Requests   Final    BOTTLES DRAWN AEROBIC AND ANAEROBIC ANA14ML AER11ML   Culture NO GROWTH 2 DAYS  Final   Report Status PENDING  Incomplete  CULTURE,  BLOOD (ROUTINE X 2) w Reflex to ID Panel     Status: None (Preliminary result)   Collection Time: 01/02/17  3:27 PM  Result Value Ref Range Status   Specimen Description BLOOD LEFT ASSIST CONTROL  Final   Special Requests BOTTLES DRAWN AEROBIC AND ANAEROBIC AER9ML ANA8ML  Final   Culture NO GROWTH 2 DAYS  Final   Report Status PENDING  Incomplete  Body fluid culture     Status: None (Preliminary result)   Collection Time: 01/03/17  2:25 PM  Result Value Ref Range Status   Specimen Description PERITONEAL  Final   Special Requests NONE  Final   Gram Stain   Final    RARE WBC PRESENT,BOTH PMN AND MONONUCLEAR NO ORGANISMS SEEN    Culture   Final    NO GROWTH < 24 HOURS Performed at [facility], [address], [place], [place] [address]    Report Status PENDING  Incomplete    Antibiotics: Cipro 1/15 >>1/15 Vancomycin 1/15 >>1/15 Piperacillin/tazo 1/15 >> 1/22  Cultures: 1/21 BCx: sent 1/15 BCx: 1/4 bottles + anaerobic GPR= DIPHTHEROIDS(CORYNEBACTERIUM SPECIES)  1/15 BCx: NGTD 1/15 MRSA PCR: negative  1/15 UCx: NG  Horris Latino, PharmD Pharmacy Resident 01/05/2017 7:20 AM

## 2017-01-05 NOTE — Progress Notes (Addendum)
Per Dr. Winona LegatoVaickute okay to reorder dakins solution as prescribed earlier in admission for dressing change. Will place wound care consult to determine if it is appropriate to continue this as order had expired.

## 2017-01-05 NOTE — Progress Notes (Signed)
[facility] CLINIC INFECTIOUS DISEASE PROGRESS NOTE Date of Admission:  12/26/2016     ID: [name] is a 56 y.o. male with infected decub ulcer Principal Problem:   Adjustment disorder with mixed anxiety and depressed mood Active Problems:   Sepsis (HCC)   Alcoholic cirrhosis of liver with ascites (HCC)   Subjective: No fevers, awaiting ltach approval   ROS  Eleven systems are reviewed and negative except per hpi  Medications:  Antibiotics Given (last 72 hours)    Date/Time Action Medication Dose Rate   01/02/17 1557 Given   piperacillin-tazobactam (ZOSYN) IVPB 4.5 g 4.5 g 25 mL/hr   01/02/17 2235 Given  [[facility] notified scanner scanned as incomplete]   piperacillin-tazobactam (ZOSYN) IVPB 4.5 g 4.5 g 25 mL/hr   01/03/17 0617 Given   piperacillin-tazobactam (ZOSYN) IVPB 4.5 g 4.5 g 25 mL/hr   01/05/17 1124 Given   piperacillin-tazobactam (ZOSYN) IVPB 3.375 g 3.375 g 12.5 mL/hr     . ammonium lactate   Topical Daily  . citalopram  10 mg Oral QHS  . diphenhydrAMINE  12.5 mg Intravenous Once  . folic acid  1 mg Oral Daily  . furosemide  40 mg Intravenous Daily  . multivitamin with minerals  1 tablet Oral Daily  . piperacillin-tazobactam (ZOSYN)  IV  3.375 g Intravenous Q8H  . pneumococcal 23 valent vaccine  0.5 mL Intramuscular Tomorrow-1000  . potassium chloride  20 mEq Oral Daily  . protein supplement shake  11 oz Oral BID BM  . sodium chloride flush  3 mL Intravenous Q12H  . sotalol  80 mg Oral BID    Objective: Vital signs in last 24 hours: Temp:  [97.5 F (36.4 C)-97.9 F (36.6 C)] 97.5 F (36.4 C) (01/24 0814) Pulse Rate:  [65-79] 79 (01/24 1125) Resp:  [16-21] 16 (01/24 0814) BP: (89-114)/(61-82) 104/69 (01/24 1125) SpO2:  [92 %-97 %] 93 % (01/24 0814) Constitutional: He is oriented to person, place, and time. Obese, chronically ill appearing HENT: mild icterus Mouth/Throat: Oropharynx is clear and moist. No oropharyngeal exudate.   Cardiovascular: Normal rate, regular rhythm and normal heart sounds. Pulmonary/Chest: Effort normal and breath sounds normal. No respiratory distress. He has no wheezes.  Abdominal: Soft. Bowel sounds are normal. Distended, but not tense Lymphadenopathy:  He has no cervical adenopathy.  Neurological: He is alert and oriented to person, place, and time.  Skin: sacrum wound covered (Previously - with large deep wound probing about 5 cm deep, extensive necortic tissue, odor and foul drainage) Psychiatric: anxious  Lab Results  Recent Labs  01/03/17 0443 01/05/17 0424  WBC  --  9.8  HGB  --  9.4*  HCT  --  27.2*  NA 131* 132*  K 4.1 3.9  CL 97* 97*  CO2 29 31  BUN 32* 25*  CREATININE 1.16 0.93    Microbiology: Results for orders placed or performed during the hospital encounter of 12/26/16  Culture, blood (routine x 2)     Status: Abnormal   Collection Time: 12/27/16 12:40 AM  Result Value Ref Range Status   Specimen Description BLOOD RIGHT HAND  Final   Special Requests   Final    BOTTLES DRAWN AEROBIC AND ANAEROBIC AERO5ML,ANAERO4ML   Culture  Setup Time   Final    GRAM POSITIVE RODS ANAEROBIC BOTTLE ONLY CRITICAL VALUE NOTED.  VALUE IS CONSISTENT WITH PREVIOUSLY REPORTED AND CALLED VALUE.    Culture (A)  Final    ANAEROBIC GRAM POSITIVE RODS UNABLE  TO FURTHER IDENTIFY. CORRECTED RESULTS CALLED TO: A ROBINSON,RN AT 1610 01/03/17 BY L BENFIELD PREVIOUSLY REPORTED AS DIPTHEROIDS Performed at [facility], [address] [place], [place] [address]    Report Status 01/03/2017 FINAL  Final  Culture, blood (routine x 2)     Status: Abnormal   Collection Time: 12/27/16 12:40 AM  Result Value Ref Range Status   Specimen Description BLOOD LEFT HAND  Final   Special Requests   Final    BOTTLES DRAWN AEROBIC AND ANAEROBIC AERO3ML,ANAERO10ML   Culture  Setup Time   Final    GRAM POSITIVE RODS ANAEROBIC BOTTLE ONLY CRITICAL RESULT CALLED TO, READ BACK BY AND VERIFIED  WITH: HANK ZOMPA 12/29/16 0900 SGD    Culture (A)  Final    ANAEROBIC GRAM POSITIVE RODS UNABLE TO FURTHER IDENTIFY. Performed at [facility], [address] [place], [place] [address]    Report Status 01/02/2017 FINAL  Final  Blood Culture ID Panel (Reflexed)     Status: None   Collection Time: 12/27/16 12:40 AM  Result Value Ref Range Status   Enterococcus species NOT DETECTED NOT DETECTED Final   Listeria monocytogenes NOT DETECTED NOT DETECTED Final   Staphylococcus species NOT DETECTED NOT DETECTED Final   Staphylococcus aureus NOT DETECTED NOT DETECTED Final   Streptococcus species NOT DETECTED NOT DETECTED Final   Streptococcus agalactiae NOT DETECTED NOT DETECTED Final   Streptococcus pneumoniae NOT DETECTED NOT DETECTED Final   Streptococcus pyogenes NOT DETECTED NOT DETECTED Final   Acinetobacter baumannii NOT DETECTED NOT DETECTED Final   Enterobacteriaceae species NOT DETECTED NOT DETECTED Final   Enterobacter cloacae complex NOT DETECTED NOT DETECTED Final   Escherichia coli NOT DETECTED NOT DETECTED Final   Klebsiella oxytoca NOT DETECTED NOT DETECTED Final   Klebsiella pneumoniae NOT DETECTED NOT DETECTED Final   Proteus species NOT DETECTED NOT DETECTED Final   Serratia marcescens NOT DETECTED NOT DETECTED Final   Haemophilus influenzae NOT DETECTED NOT DETECTED Final   Neisseria meningitidis NOT DETECTED NOT DETECTED Final   Pseudomonas aeruginosa NOT DETECTED NOT DETECTED Final   Candida albicans NOT DETECTED NOT DETECTED Final   Candida glabrata NOT DETECTED NOT DETECTED Final   Candida krusei NOT DETECTED NOT DETECTED Final   Candida parapsilosis NOT DETECTED NOT DETECTED Final   Candida tropicalis NOT DETECTED NOT DETECTED Final  MRSA PCR Screening     Status: None   Collection Time: 12/27/16  6:30 AM  Result Value Ref Range Status   MRSA by PCR NEGATIVE NEGATIVE Final    Comment:        The GeneXpert MRSA Assay (FDA approved for NASAL  specimens only), is one component of a comprehensive MRSA colonization surveillance program. It is not intended to diagnose MRSA infection nor to guide or monitor treatment for MRSA infections.   Urine culture     Status: None   Collection Time: 12/27/16 11:32 AM  Result Value Ref Range Status   Specimen Description URINE, CLEAN CATCH  Final   Special Requests NONE  Final   Culture   Final    NO GROWTH Performed at [facility], [address], [place], [place] [address]    Report Status 12/28/2016 FINAL  Final  Aerobic/Anaerobic Culture (surgical/deep wound)     Status: None (Preliminary result)   Collection Time: 01/02/17 10:23 AM  Result Value Ref Range Status   Specimen Description DECUBITIS  Final   Special Requests NONE  Final  Gram Stain   Final    MODERATE WBC PRESENT, PREDOMINANTLY PMN FEW GRAM POSITIVE COCCI IN PAIRS FEW GRAM POSITIVE RODS RARE GRAM NEGATIVE RODS    Culture   Final    MODERATE NORMAL SKIN FLORA CULTURE REINCUBATED FOR BETTER GROWTH Performed at [facility], [address] [place], [place] [address]    Report Status PENDING  Incomplete  C difficile quick scan w PCR reflex     Status: None   Collection Time: 01/02/17 11:24 AM  Result Value Ref Range Status   C Diff antigen NEGATIVE NEGATIVE Final   C Diff toxin NEGATIVE NEGATIVE Final   C Diff interpretation No C. difficile detected.  Final  CULTURE, BLOOD (ROUTINE X 2) w Reflex to ID Panel     Status: None (Preliminary result)   Collection Time: 01/02/17  3:26 PM  Result Value Ref Range Status   Specimen Description BLOOD RIGHT HAND  Final   Special Requests   Final    BOTTLES DRAWN AEROBIC AND ANAEROBIC ANA14ML AER11ML   Culture NO GROWTH 3 DAYS  Final   Report Status PENDING  Incomplete  CULTURE, BLOOD (ROUTINE X 2) w Reflex to ID Panel     Status: None (Preliminary result)   Collection Time: 01/02/17  3:27 PM  Result Value Ref Range Status   Specimen Description BLOOD  LEFT ASSIST CONTROL  Final   Special Requests BOTTLES DRAWN AEROBIC AND ANAEROBIC AER9ML ANA8ML  Final   Culture NO GROWTH 3 DAYS  Final   Report Status PENDING  Incomplete  Body fluid culture     Status: None (Preliminary result)   Collection Time: 01/03/17  2:25 PM  Result Value Ref Range Status   Specimen Description PERITONEAL  Final   Special Requests NONE  Final   Gram Stain   Final    RARE WBC PRESENT,BOTH PMN AND MONONUCLEAR NO ORGANISMS SEEN    Culture   Final    NO GROWTH 2 DAYS Performed at [facility], [address] [place], [place] [address]    Report Status PENDING  Incomplete      Studies/Results: [place] Paracentesis  Result Date: 01/03/2017 INDICATION: Cirrhosis and ascites. EXAM: ULTRASOUND GUIDED PARACENTESIS MEDICATIONS: None. COMPLICATIONS: None immediate. PROCEDURE: Informed written consent was obtained from the patient after a discussion of the risks, benefits and alternatives to treatment. A timeout was performed prior to the initiation of the procedure. Initial ultrasound was used to localize ascites. The right lower abdomen was prepped and draped in the usual sterile fashion. 1% lidocaine was used for local anesthesia. Following this, a 6 Fr Safe-T-Centesis catheter was introduced. An ultrasound image was saved for documentation purposes. The paracentesis was performed. The catheter was removed and a dressing was applied. The patient tolerated the procedure well without immediate post procedural complication. FINDINGS: A total of approximately 13.8 L of yellow fluid was removed. IMPRESSION: Successful ultrasound-guided paracentesis yielding 13.8 liters of peritoneal fluid. Electronically Signed   By: Irish LackGlenn  Yamagata M.D.   On: 01/03/2017 17:01    Assessment/Plan: [name] is a 56 y.o. male admitted with bleeding per rectum and bleeding sacral wound as well as fever, leukocytosis and bcx + anaerobic gram positive rods in 2/2 . Has been seen by  surgery and wound care. CT shows inflammation down to coccyx and lower sacrum.  The wound seems to have worsened since last seen by surgery and wound care and now has odor, drainage and extensive necrotic  tissue. Clinically stable however. Has advanced cirrhosis with ascites.  I discussed the case with surgery - Dr Tonita Cong and with case manager.  PICC placed- cx with skin flora  Recommendations Will need IV zosyn for minimal 6 week course. Best served at [facility]   Thank you very much for the consult. Will follow with you.  Seham Gardenhire P   01/05/2017, 1:51 PM

## 2017-01-05 NOTE — Care Management (Signed)
[facility]

## 2017-01-05 NOTE — Progress Notes (Signed)
Patient ID: [name], male   DOB: 1960/12/21, 56 y.o.   MRN: [id number]   [employer] [facility] PROGRESS NOTE  [name] DGU:[id number] DOB: 10-Mar-1961 DOA: 12/26/2016 PCP: Corky Downs, MD  HPI/Subjective: Patient is asleep, not able to review systems. According to the patient's girlfriend, he is doing well, has no pains or discomfort or shortness of breath, no bleeding . Hemoglobin level is stable at 9.4 today.   Objective: Vitals:   01/05/17 1125 01/05/17 1429  BP: 104/69 100/70  Pulse: 79 74  Resp:  20  Temp:  98.2 F (36.8 C)    Filed Weights   01/01/17 0500 01/02/17 0500 01/03/17 0556  Weight: (!) 148.8 kg (328 lb) (!) 147.4 kg (325 lb) (!) 142.9 kg (315 lb)    ROS: Review of Systems  Unable to perform ROS: Other  Sleeping  Exam: Physical Exam  Constitutional: He is oriented to person, place, and time. He is sleeping.  HENT:  Nose: No mucosal edema.  Mouth/Throat: No oropharyngeal exudate or posterior oropharyngeal edema.  Eyes: Conjunctivae, EOM and lids are normal. Pupils are equal, round, and reactive to light.  Neck: No JVD present. Carotid bruit is not present. No edema present. No thyroid mass and no thyromegaly present.  Cardiovascular: S1 normal and S2 normal.  An irregularly irregular rhythm present. Exam reveals no gallop.   No murmur heard. Pulses:      Dorsalis pedis pulses are 2+ on the right side, and 2+ on the left side.  Respiratory: No respiratory distress. He has decreased breath sounds in the right lower field. He has no wheezes. He has no rhonchi. He has no rales.  GI: Soft. Bowel sounds are normal. He exhibits fluid wave. He exhibits no distension. There is no tenderness.  Genitourinary:  Genitourinary Comments: 4+ scrotal and penis swelling  Musculoskeletal:       Right ankle: He exhibits swelling.       Left ankle: He exhibits swelling.  Lymphadenopathy:    He has no cervical adenopathy.  Neurological: He is oriented to person,  place, and time. No cranial nerve deficit.  Skin: Skin is warm. Nails show no clubbing.  Large sacral decubitus requiring packing with some necrotic material surrounding. Patient has 2 superficial areas of skin breakdown surrounding this.  Psychiatric: He has a normal mood and affect.      Data Reviewed: Basic Metabolic Panel:  Recent Labs Lab 12/30/16 0456 12/31/16 0526 01/01/17 1229 01/02/17 0642 01/03/17 0443 01/05/17 0424  NA 131* 129* 127* 129* 131* 132*  K 3.8 4.0 4.4 4.2 4.1 3.9  CL 99* 96* 95* 95* 97* 97*  CO2 27 28 26 29 29 31   GLUCOSE 159* 147* 164* 127* 109* 127*  BUN 32* 30* 33* 32* 32* 25*  CREATININE 1.29* 1.16 1.24 1.11 1.16 0.93  CALCIUM 7.8* 8.0* 8.0* 8.0* 8.2* 8.0*  MG 1.8  --  1.7 1.8 1.7 1.7   Liver Function Tests:  Recent Labs Lab 01/02/17 0642  ALBUMIN 1.7*    Recent Labs Lab 12/30/16 1734 01/04/17 0847  AMMONIA 44* <9*   CBC:  Recent Labs Lab 12/30/16 0456 12/31/16 0526 01/02/17 0642 01/05/17 0424  WBC 17.2* 14.0* 13.1* 9.8  HGB 8.8* 9.4* 9.4* 9.4*  HCT 25.5* 26.9* 27.7* 27.2*  MCV 101.3* 99.1 100.2* 100.9*  PLT 108* 120* 153 128*    CBG:  Recent Labs Lab 01/03/17 1645 01/03/17 2122 01/04/17 0731 01/04/17 1120 01/04/17 1647  GLUCAP 120* 162* 114* 121* 158*  Recent Results (from the past 240 hour(s))  Culture, blood (routine x 2)     Status: Abnormal   Collection Time: 12/27/16 12:40 AM  Result Value Ref Range Status   Specimen Description BLOOD RIGHT HAND  Final   Special Requests   Final    BOTTLES DRAWN AEROBIC AND ANAEROBIC AERO5ML,ANAERO4ML   Culture  Setup Time   Final    GRAM POSITIVE RODS ANAEROBIC BOTTLE ONLY CRITICAL VALUE NOTED.  VALUE IS CONSISTENT WITH PREVIOUSLY REPORTED AND CALLED VALUE.    Culture (A)  Final    ANAEROBIC GRAM POSITIVE RODS UNABLE TO FURTHER IDENTIFY. CORRECTED RESULTS CALLED TO: A ROBINSON,RN AT 1610 01/03/17 BY L BENFIELD PREVIOUSLY REPORTED AS DIPTHEROIDS Performed at [facility], [address], [place], [place] [address]    Report Status 01/03/2017 FINAL  Final  Culture, blood (routine x 2)     Status: Abnormal   Collection Time: 12/27/16 12:40 AM  Result Value Ref Range Status   Specimen Description BLOOD LEFT HAND  Final   Special Requests   Final    BOTTLES DRAWN AEROBIC AND ANAEROBIC AERO3ML,ANAERO10ML   Culture  Setup Time   Final    GRAM POSITIVE RODS ANAEROBIC BOTTLE ONLY CRITICAL RESULT CALLED TO, READ BACK BY AND VERIFIED WITH: HANK ZOMPA 12/29/16 0900 SGD    Culture (A)  Final    ANAEROBIC GRAM POSITIVE RODS UNABLE TO FURTHER IDENTIFY. Performed at [facility], [address], [place], [place] [address]    Report Status 01/02/2017 FINAL  Final  Blood Culture ID Panel (Reflexed)     Status: None   Collection Time: 12/27/16 12:40 AM  Result Value Ref Range Status   Enterococcus species NOT DETECTED NOT DETECTED Final   Listeria monocytogenes NOT DETECTED NOT DETECTED Final   Staphylococcus species NOT DETECTED NOT DETECTED Final   Staphylococcus aureus NOT DETECTED NOT DETECTED Final   Streptococcus species NOT DETECTED NOT DETECTED Final   Streptococcus agalactiae NOT DETECTED NOT DETECTED Final   Streptococcus pneumoniae NOT DETECTED NOT DETECTED Final   Streptococcus pyogenes NOT DETECTED NOT DETECTED Final   Acinetobacter baumannii NOT DETECTED NOT DETECTED Final   Enterobacteriaceae species NOT DETECTED NOT DETECTED Final   Enterobacter cloacae complex NOT DETECTED NOT DETECTED Final   Escherichia coli NOT DETECTED NOT DETECTED Final   Klebsiella oxytoca NOT DETECTED NOT DETECTED Final   Klebsiella pneumoniae NOT DETECTED NOT DETECTED Final   Proteus species NOT DETECTED NOT DETECTED Final   Serratia marcescens NOT DETECTED NOT DETECTED Final   Haemophilus influenzae NOT DETECTED NOT DETECTED Final   Neisseria meningitidis NOT DETECTED NOT DETECTED Final   Pseudomonas aeruginosa NOT DETECTED NOT DETECTED Final    Candida albicans NOT DETECTED NOT DETECTED Final   Candida glabrata NOT DETECTED NOT DETECTED Final   Candida krusei NOT DETECTED NOT DETECTED Final   Candida parapsilosis NOT DETECTED NOT DETECTED Final   Candida tropicalis NOT DETECTED NOT DETECTED Final  MRSA PCR Screening     Status: None   Collection Time: 12/27/16  6:30 AM  Result Value Ref Range Status   MRSA by PCR NEGATIVE NEGATIVE Final    Comment:        The GeneXpert MRSA Assay (FDA approved for NASAL specimens only), is one component of a comprehensive MRSA colonization surveillance program. It is not intended to diagnose MRSA infection nor to guide or monitor treatment for MRSA infections.   Urine culture     Status:  None   Collection Time: 12/27/16 11:32 AM  Result Value Ref Range Status   Specimen Description URINE, CLEAN CATCH  Final   Special Requests NONE  Final   Culture   Final    NO GROWTH Performed at [facility], [address] [place], [place] [address]    Report Status 12/28/2016 FINAL  Final  Aerobic/Anaerobic Culture (surgical/deep wound)     Status: None (Preliminary result)   Collection Time: 01/02/17 10:23 AM  Result Value Ref Range Status   Specimen Description DECUBITIS  Final   Special Requests NONE  Final   Gram Stain   Final    MODERATE WBC PRESENT, PREDOMINANTLY PMN FEW GRAM POSITIVE COCCI IN PAIRS FEW GRAM POSITIVE RODS RARE GRAM NEGATIVE RODS    Culture   Final    MODERATE NORMAL SKIN FLORA CULTURE REINCUBATED FOR BETTER GROWTH Performed at [facility], [address] [place], [place] [address]    Report Status PENDING  Incomplete  C difficile quick scan w PCR reflex     Status: None   Collection Time: 01/02/17 11:24 AM  Result Value Ref Range Status   C Diff antigen NEGATIVE NEGATIVE Final   C Diff toxin NEGATIVE NEGATIVE Final   C Diff interpretation No C. difficile detected.  Final  CULTURE, BLOOD (ROUTINE X 2) w Reflex to ID Panel     Status: None  (Preliminary result)   Collection Time: 01/02/17  3:26 PM  Result Value Ref Range Status   Specimen Description BLOOD RIGHT HAND  Final   Special Requests   Final    BOTTLES DRAWN AEROBIC AND ANAEROBIC ANA14ML AER11ML   Culture NO GROWTH 3 DAYS  Final   Report Status PENDING  Incomplete  CULTURE, BLOOD (ROUTINE X 2) w Reflex to ID Panel     Status: None (Preliminary result)   Collection Time: 01/02/17  3:27 PM  Result Value Ref Range Status   Specimen Description BLOOD LEFT ASSIST CONTROL  Final   Special Requests BOTTLES DRAWN AEROBIC AND ANAEROBIC AER9ML ANA8ML  Final   Culture NO GROWTH 3 DAYS  Final   Report Status PENDING  Incomplete  Body fluid culture     Status: None (Preliminary result)   Collection Time: 01/03/17  2:25 PM  Result Value Ref Range Status   Specimen Description PERITONEAL  Final   Special Requests NONE  Final   Gram Stain   Final    RARE WBC PRESENT,BOTH PMN AND MONONUCLEAR NO ORGANISMS SEEN    Culture   Final    NO GROWTH 2 DAYS Performed at [facility], [address] [place], [place] [address]    Report Status PENDING  Incomplete      Scheduled Meds: . ammonium lactate   Topical Daily  . citalopram  10 mg Oral QHS  . diphenhydrAMINE  12.5 mg Intravenous Once  . folic acid  1 mg Oral Daily  . furosemide  40 mg Intravenous Daily  . multivitamin with minerals  1 tablet Oral Daily  . piperacillin-tazobactam (ZOSYN)  IV  3.375 g Intravenous Q8H  . pneumococcal 23 valent vaccine  0.5 mL Intramuscular Tomorrow-1000  . potassium chloride  20 mEq Oral Daily  . protein supplement shake  11 oz Oral BID BM  . sodium chloride flush  3 mL Intravenous Q12H  . sotalol  80 mg Oral BID    Assessment/Plan:   1. Hemorrhagic shock due to bleeding sacral ulcer while  on anticoagulation. Acute hemorrhagic anemia. Blood pressure still on the lower side after the paracentesis of 13 L recently. Hemoglobin stabilized. 2. Infected decubitus ulcer Which is at  least a stage III. On Zosyn.  Patient seen by Dr. Sampson Goon and he recommended a 6 week course of IV Zosyn. Hopefully patient will be approved for LTAC facility with insurance. Continue local wound care. 3. Acute kidney injury on chronic kidney disease stage II continue to monitor with diuresis, Remains stable  4. Hyponatremia and anasarca. Continue IV Lasix daily. Blood pressure little bit too low for Aldactone at this point.Sodium level has improved over the past week  5. Chronic atrial fibrillation on sotalol. Hold anticoagulation secondary to bleeding.  6. History of chronic diastolic congestive heart failure. IV Lasix for anasarca. 7. Alcoholic liver cirrhosis with ascites. Second paracentesis on this hospitalization done 1.22.18 with greater than 13 L taken off.Follow blood pressure readings closely as well as urinary output and kidney function  8. Weakness. Physical therapy recommends rehabilitation. Hopefully LTAC placement within the next few days  Code Status:     Code Status Orders        Start     Ordered   12/27/16 1914  Full code  Continuous     12/27/16 0632    Code Status History    Date Active Date Inactive Code Status Order ID Comments User Context   12/15/2016  8:06 PM 12/21/2016  4:29 PM Full Code [id number]  Altamese Dilling, MD ED   04/17/2016  7:26 PM 04/19/2016  5:50 PM Full Code [id number]  Wyatt Haste, MD ED     Family Communication: Spoke with girlfriend At the bedside  Disposition Plan: Hopefully insurance will cover LTAC facility  Consultants:  Psychiatry  Gen. Surgery  Gastroenterology  Antibiotics:  Zosyn  Time spent: 35 minutes Discussed with the patient's girlfriend  [employer]

## 2017-01-05 NOTE — Progress Notes (Signed)
Per Dr. Winona LegatoVaickute hold IV lasix dose for low BP

## 2017-01-06 LAB — BODY FLUID CULTURE: Culture: NO GROWTH

## 2017-01-06 MED ORDER — FUROSEMIDE 20 MG PO TABS
20.0000 mg | ORAL_TABLET | Freq: Every day | ORAL | Status: DC
  Administered 2017-01-07 – 2017-01-14 (×8): 20 mg via ORAL
  Filled 2017-01-06 (×8): qty 1

## 2017-01-06 NOTE — Progress Notes (Signed)
Per Dr. Winona Legatovaickute okay to administer Lasix IV push and hold off betapace until 2pm

## 2017-01-06 NOTE — Progress Notes (Signed)
Physical Therapy Treatment Patient Details Name: [name] RouteMichael J Kretchmer MRN: [id number] DOB: [birth year] Today's Date: 01/06/2017    History of Present Illness presented to ER secondary to rectal bleeding; admitted with septic shock secondary to necrotic sacral ulcer.    PT Comments    Pt agreed on 4th attempt this am.  Participated in exercises as described below.  Pt encouraged before and after exercies to attempt standing and gait activities.  He declined stating "I'll do it soon" but stating he was not going to get out of bed right now.  Educated on risks of immobility but he continued to decline.  Will continue as appropriate.   Follow Up Recommendations  SNF     Equipment Recommendations       Recommendations for Other Services       Precautions / Restrictions Precautions Precautions: Fall Restrictions Weight Bearing Restrictions: No    Mobility  Bed Mobility               General bed mobility comments: refused  Transfers                 General transfer comment: refused  Ambulation/Gait             General Gait Details: refused   Stairs            Wheelchair Mobility    Modified Rankin (Stroke Patients Only)       Balance                                    Cognition Arousal/Alertness: Awake/alert Behavior During Therapy: WFL for tasks assessed/performed Overall Cognitive Status: Within Functional Limits for tasks assessed                 General Comments: alert today and responsive     Exercises Other Exercises Other Exercises: Supine therex, AAROM 1x12; w/ 20% PT assist; ankle pumps; SLR; heel slides; hip abd. To improve overall strength     General Comments        Pertinent Vitals/Pain Pain Assessment: 0-10 Pain Score: 4  Pain Location: generalized soreness Pain Intervention(s): Limited activity within patient's tolerance    Home Living                      Prior Function           PT Goals (current goals can now be found in the care plan section)      Frequency    Min 2X/week      PT Plan Current plan remains appropriate    Co-evaluation             End of Session Equipment Utilized During Treatment: Gait belt Activity Tolerance: Patient limited by fatigue Patient left: in bed;with call bell/phone within reach;with bed alarm set;with family/visitor present     Time: [id number] PT Time Calculation (min) (ACUTE ONLY): 9 min  Charges:  $Therapeutic Exercise: 8-22 mins                    G Codes:      Danielle DessSarah Mohogany Toppins 01/06/2017, 12:03 PM

## 2017-01-06 NOTE — Progress Notes (Signed)
Occupational Therapy Treatment Patient Details Name: RENELL ALLUM MRN: [id number] DOB: 06/09/1961 Today's Date: 01/06/2017    History of present illness Pt. is a a 56 y.o. male who was admitted to [facility] with rectal bleeding, and septic shock secondary to a necrotic sacral ulcer.   OT comments  Pt. continues to present with weakness, pain, decreased activity tolerance, and impaired functional mobility. Pt. Education was provided about basic reacher use. Pt. Reports having several reachers at home. Pt. Education was provided about positioning in bed, and UE use for meal, and self care. Pt. Continues to benefit from skilled OT services for ADL training, A/E training, functional mobility, UE there. Ex., positioning, and functional mobility. Pt. Is planning to go to [facility] in [place]. Pt. Could benefit from follow-up OT services.   Follow Up Recommendations  LTACH    Equipment Recommendations       Recommendations for Other Services      Precautions / Restrictions Precautions Precautions: Fall Restrictions Weight Bearing Restrictions: No                                             ADL Overall ADL's : Needs assistance/impaired     Grooming: Set up;Bed level               Lower Body Dressing: Maximal assistance                 General ADL Comments: Pt. education was provided about A/E use for LE ADLs.      Vision                     Perception     Praxis      Cognition   Behavior During Therapy: WFL for tasks assessed/performed Overall Cognitive Status: Within Functional Limits for tasks assessed                  General Comments: alert today and responsive     Extremity/Trunk Assessment               Exercises   Shoulder Instructions       General Comments      Pertinent Vitals/ Pain       Pain Assessment: 0-10 Pain Score: 4  Pain Location: generalized soreness Pain Intervention(s): Limited activity  within patient's tolerance;Monitored during session  Home Living                                          Prior Functioning/Environment              Frequency  Min 1X/week        Progress Toward Goals  OT Goals(current goals can now be found in the care plan section)        Plan      Co-evaluation                 End of Session     Activity Tolerance Patient limited by fatigue   Patient Left     Nurse Communication          Time: 1040-1105 OT Time Calculation (min): 25 min  Charges: OT General Charges $OT Visit: 1 Procedure OT Treatments $Self Care/Home Management : 23-37 mins  Olegario MessierElaine Beverlee Wilmarth, MS, OTR/L 01/06/2017, 12:32 PM

## 2017-01-06 NOTE — Progress Notes (Signed)
Patient ID: [name], male   DOB: December 23, 1960, 56 y.o.   MRN: [id number]   [employer] PROGRESS NOTE  [name]:[id number] DOB: [birth year] DOA: 12/26/2016 PCP: Corky Downs, MD  HPI/Subjective: Patient is comfortable, denies any pain. No bleeding   Objective: Vitals:   01/06/17 1211 01/06/17 1336  BP: 99/77 107/80  Pulse: 74 76  Resp: 20   Temp: 97.7 F (36.5 C)     Filed Weights   01/01/17 0500 01/02/17 0500 01/03/17 0556  Weight: (!) 148.8 kg (328 lb) (!) 147.4 kg (325 lb) (!) 142.9 kg (315 lb)    ROS: Review of Systems  Unable to perform ROS: Other  Sleeping  Exam: Physical Exam  Constitutional: He is oriented to person, place, and time. He is sleeping.  HENT:  Nose: No mucosal edema.  Mouth/Throat: No oropharyngeal exudate or posterior oropharyngeal edema.  Eyes: Conjunctivae, EOM and lids are normal. Pupils are equal, round, and reactive to light.  Neck: No JVD present. Carotid bruit is not present. No edema present. No thyroid mass and no thyromegaly present.  Cardiovascular: S1 normal and S2 normal.  An irregularly irregular rhythm present. Exam reveals no gallop.   No murmur heard. Pulses:      Dorsalis pedis pulses are 2+ on the right side, and 2+ on the left side.  Respiratory: No respiratory distress. He has decreased breath sounds in the right lower field. He has no wheezes. He has no rhonchi. He has no rales.  GI: Soft. Bowel sounds are normal. He exhibits fluid wave. He exhibits no distension. There is no tenderness.  Genitourinary:  Genitourinary Comments: 4+ scrotal and penis swelling  Musculoskeletal:       Right ankle: He exhibits swelling.       Left ankle: He exhibits swelling.  Lymphadenopathy:    He has no cervical adenopathy.  Neurological: He is oriented to person, place, and time. No cranial nerve deficit.  Skin: Skin is warm. Nails show no clubbing.  Large sacral decubitus requiring packing with some necrotic material  surrounding. Patient has 2 superficial areas of skin breakdown surrounding this.  Psychiatric: He has a normal mood and affect.  Decubitus ulcer exhibits no bleeding signs. Only serous drainage    Data Reviewed: Basic Metabolic Panel:  Recent Labs Lab 12/31/16 0526 01/01/17 1229 01/02/17 0642 01/03/17 0443 01/05/17 0424  NA 129* 127* 129* 131* 132*  K 4.0 4.4 4.2 4.1 3.9  CL 96* 95* 95* 97* 97*  CO2 28 26 29 29 31   GLUCOSE 147* 164* 127* 109* 127*  BUN 30* 33* 32* 32* 25*  CREATININE 1.16 1.24 1.11 1.16 0.93  CALCIUM 8.0* 8.0* 8.0* 8.2* 8.0*  MG  --  1.7 1.8 1.7 1.7   Liver Function Tests:  Recent Labs Lab 01/02/17 0642  ALBUMIN 1.7*    Recent Labs Lab 12/30/16 1734 01/04/17 0847  AMMONIA 44* <9*   CBC:  Recent Labs Lab 12/31/16 0526 01/02/17 0642 01/05/17 0424  WBC 14.0* 13.1* 9.8  HGB 9.4* 9.4* 9.4*  HCT 26.9* 27.7* 27.2*  MCV 99.1 100.2* 100.9*  PLT 120* 153 128*    CBG:  Recent Labs Lab 01/03/17 1645 01/03/17 2122 01/04/17 0731 01/04/17 1120 01/04/17 1647  GLUCAP 120* 162* 114* 121* 158*    Recent Results (from the past 240 hour(s))  Aerobic/Anaerobic Culture (surgical/deep wound)     Status: None (Preliminary result)   Collection Time: 01/02/17 10:23 AM  Result Value Ref Range Status  Specimen Description DECUBITIS  Final   Special Requests NONE  Final   Gram Stain   Final    MODERATE WBC PRESENT, PREDOMINANTLY PMN FEW GRAM POSITIVE COCCI IN PAIRS FEW GRAM POSITIVE RODS RARE GRAM NEGATIVE RODS    Culture   Final    MODERATE NORMAL SKIN FLORA CULTURE REINCUBATED FOR BETTER GROWTH Performed at [facility], [address], [place], [place] [address]    Report Status PENDING  Incomplete  C difficile quick scan w PCR reflex     Status: None   Collection Time: 01/02/17 11:24 AM  Result Value Ref Range Status   C Diff antigen NEGATIVE NEGATIVE Final   C Diff toxin NEGATIVE NEGATIVE Final   C Diff interpretation No C.  difficile detected.  Final  CULTURE, BLOOD (ROUTINE X 2) w Reflex to ID Panel     Status: None (Preliminary result)   Collection Time: 01/02/17  3:26 PM  Result Value Ref Range Status   Specimen Description BLOOD RIGHT HAND  Final   Special Requests   Final    BOTTLES DRAWN AEROBIC AND ANAEROBIC ANA14ML AER11ML   Culture NO GROWTH 4 DAYS  Final   Report Status PENDING  Incomplete  CULTURE, BLOOD (ROUTINE X 2) w Reflex to ID Panel     Status: None (Preliminary result)   Collection Time: 01/02/17  3:27 PM  Result Value Ref Range Status   Specimen Description BLOOD LEFT ASSIST CONTROL  Final   Special Requests BOTTLES DRAWN AEROBIC AND ANAEROBIC AER9ML ANA8ML  Final   Culture NO GROWTH 4 DAYS  Final   Report Status PENDING  Incomplete  Body fluid culture     Status: None   Collection Time: 01/03/17  2:25 PM  Result Value Ref Range Status   Specimen Description PERITONEAL  Final   Special Requests NONE  Final   Gram Stain   Final    RARE WBC PRESENT,BOTH PMN AND MONONUCLEAR NO ORGANISMS SEEN    Culture   Final    NO GROWTH 3 DAYS Performed at [facility], [address], [place], [place] [address]    Report Status 01/06/2017 FINAL  Final      Scheduled Meds: . ammonium lactate   Topical Daily  . citalopram  10 mg Oral QHS  . diphenhydrAMINE  12.5 mg Intravenous Once  . folic acid  1 mg Oral Daily  . furosemide  40 mg Intravenous Daily  . multivitamin with minerals  1 tablet Oral Daily  . piperacillin-tazobactam (ZOSYN)  IV  3.375 g Intravenous Q8H  . pneumococcal 23 valent vaccine  0.5 mL Intramuscular Tomorrow-1000  . potassium chloride  20 mEq Oral Daily  . protein supplement shake  11 oz Oral BID BM  . sodium chloride flush  3 mL Intravenous Q12H  . sodium hypochlorite   Irrigation BID  . sotalol  80 mg Oral BID    Assessment/Plan:   1. Hemorrhagic shock due to bleeding sacral ulcer while on anticoagulation. Acute hemorrhagic anemia. Blood pressure still on  the lower side after the paracentesis of 13 L recently. Hemoglobin stabilized. 2. Infected decubitus ulcer which is at least a stage III.  Patient seen by Dr. Sampson Goon and he recommended a 6 week course of IV Zosyn. That is post PICC line placement in the right arm. Hopefully patient will be approved for LTAC facility with insurance. Continue local wound care. 3. Acute kidney injury on chronic kidney disease stage II continue  to monitor with diuresis, Remains stable  4. Hyponatremia and anasarca. Continue Lasix daily changed to oral route. Blood pressure little bit too low for Aldactone at this point.Sodium level has improved over the past week and remains stable  5. Chronic atrial fibrillation on sotalol. Hold anticoagulation secondary to bleeding.  6. History of chronic diastolic congestive heart failure. Lasix for anasarca, initiate spironolactone when able. 7. Alcoholic liver cirrhosis with ascites. Second paracentesis on this hospitalization done 1.22.18 with greater than 13 L taken off.Follow blood pressure readings closely as well as urinary output and kidney function  8. Weakness. Physical therapy recommends rehabilitation. Hopefully LTAC placement within the next few days  Code Status:     Code Status Orders        Start     Ordered   12/27/16 16100632  Full code  Continuous     12/27/16 0632    Code Status History    Date Active Date Inactive Code Status Order ID Comments User Context   12/15/2016  8:06 PM 12/21/2016  4:29 PM Full Code [id number]  Altamese DillingVaibhavkumar Vachhani, MD ED   04/17/2016  7:26 PM 04/19/2016  5:50 PM Full Code [id number]  Wyatt Hasteavid K Hower, MD ED     Family Communication:  Disposition Plan: Hopefully insurance will cover LTAC facility  Consultants:  Psychiatry  Gen. Surgery  Gastroenterology  Antibiotics:  Zosyn  Time spent: 30 minutes  [employer]ICK[employer],Rozell Theiler  [employer]

## 2017-01-07 LAB — CULTURE, BLOOD (ROUTINE X 2)
Culture: NO GROWTH
Culture: NO GROWTH

## 2017-01-07 LAB — BASIC METABOLIC PANEL
Anion gap: 3 — ABNORMAL LOW (ref 5–15)
BUN: 23 mg/dL — AB (ref 6–20)
CALCIUM: 7.9 mg/dL — AB (ref 8.9–10.3)
CO2: 32 mmol/L (ref 22–32)
CREATININE: 0.99 mg/dL (ref 0.61–1.24)
Chloride: 97 mmol/L — ABNORMAL LOW (ref 101–111)
GFR calc Af Amer: >60 mL/min (ref >60)
GLUCOSE: 141 mg/dL — AB (ref 65–99)
Potassium: 3.6 mmol/L (ref 3.5–5.1)
Sodium: 132 mmol/L — ABNORMAL LOW (ref 135–145)

## 2017-01-07 LAB — AEROBIC/ANAEROBIC CULTURE W GRAM STAIN (SURGICAL/DEEP WOUND)

## 2017-01-07 LAB — MAGNESIUM: Magnesium: 1.8 mg/dL (ref 1.7–2.4)

## 2017-01-07 LAB — AEROBIC/ANAEROBIC CULTURE (SURGICAL/DEEP WOUND): CULTURE: NORMAL

## 2017-01-07 LAB — HEMOGLOBIN: Hemoglobin: 9.7 g/dL — ABNORMAL LOW (ref 13.0–18.0)

## 2017-01-07 MED ORDER — SODIUM CHLORIDE 0.9% FLUSH
10.0000 mL | Freq: Two times a day (BID) | INTRAVENOUS | Status: DC
  Administered 2017-01-07 – 2017-01-14 (×14): 10 mL via INTRAVENOUS

## 2017-01-07 NOTE — Progress Notes (Signed)
CC: Decubitus ulcers Subjective: Asked by Dr. Sampson GoonFitzgerald to revisit with patient about his decubitus ulcers. Patient reports he is feeling much better and desires to leave the hospital as soon as possible. Awaiting approval for LTAC.  Objective: Vital signs in last 24 hours: Temp:  [97.5 F (36.4 C)-97.8 F (36.6 C)] 97.5 F (36.4 C) (01/26 1444) Pulse Rate:  [68-77] 77 (01/26 1444) Resp:  [12-20] 20 (01/26 1444) BP: (101-110)/(72-84) 102/72 (01/26 1444) SpO2:  [92 %-96 %] 92 % (01/26 1444) Last BM Date: 01/06/17  Intake/Output from previous day: 01/25 0701 - 01/26 0700 In: 413 [P.O.:360; I.V.:3; IV Piggyback:50] Out: 625 [Urine:625] Intake/Output this shift: Total I/O In: 170 [P.O.:120; IV Piggyback:50] Out: 300 [Urine:300]  Physical exam:  On examination of the patient's rectum patient continues to have 2 separate decubitus ulcers. Closer to his rectum is a 2 cm clean-appearing ulcer with no active bleeding or evidence of infection. At the level of his coccyx there is a 4 cm opening with some areas of eschar like dry necrosis at the most cephalad aspect. No spreading erythema or purulence from the wound.  Lab Results: CBC   Recent Labs  01/05/17 0424 01/07/17 1008  WBC 9.8  --   HGB 9.4* 9.7*  HCT 27.2*  --   PLT 128*  --    BMET  Recent Labs  01/05/17 0424 01/07/17 0356  NA 132* 132*  K 3.9 3.6  CL 97* 97*  CO2 31 32  GLUCOSE 127* 141*  BUN 25* 23*  CREATININE 0.93 0.99  CALCIUM 8.0* 7.9*   PT/INR No results for input(s): LABPROT, INR in the last 72 hours. ABG No results for input(s): PHART, HCO3 in the last 72 hours.  Invalid input(s): PCO2, PO2  Studies/Results: No results found.  Anti-infectives: Anti-infectives    Start     Dose/Rate Route Frequency Ordered Stop   01/05/17 0930  piperacillin-tazobactam (ZOSYN) IVPB 3.375 g     3.375 g 12.5 mL/hr over 240 Minutes Intravenous Every 8 hours 01/05/17 0916     01/01/17 1415   piperacillin-tazobactam (ZOSYN) IVPB 4.5 g     4.5 g 25 mL/hr over 240 Minutes Intravenous Every 8 hours 01/01/17 1401 01/03/17 1017   12/27/16 0800  ciprofloxacin (CIPRO) tablet 500 mg  Status:  Discontinued     500 mg Oral 2 times daily 12/27/16 [id number] 12/27/16 1046   12/27/16 0800  vancomycin (VANCOCIN) 1,500 mg in sodium chloride 0.9 % 500 mL IVPB  Status:  Discontinued     1,500 mg 250 mL/hr over 120 Minutes Intravenous Every 12 hours 12/27/16 0641 12/27/16 1046   12/27/16 0645  piperacillin-tazobactam (ZOSYN) IVPB 4.5 g     4.5 g 25 mL/hr over 240 Minutes Intravenous Every 8 hours 12/27/16 0634 01/01/17 0933   12/27/16 0030  vancomycin (VANCOCIN) IVPB 1000 mg/200 mL premix     1,000 mg 200 mL/hr over 60 Minutes Intravenous  Once 12/27/16 0026 12/27/16 0240   12/27/16 0030  piperacillin-tazobactam (ZOSYN) IVPB 3.375 g     3.375 g 100 mL/hr over 30 Minutes Intravenous  Once 12/27/16 0026 12/27/16 0136      Assessment/Plan:  56 year old male with multiple medical problems and chronic decubitus ulcers. There are areas of sterile necrosis within the larger of the 2 ulcers. Discussed with the patient that we would attempt to debride this at the bedside tomorrow. Patient voiced understanding.  Skyllar Notarianni T. Tonita CongWoodham, MD, FACS  01/07/2017

## 2017-01-07 NOTE — Progress Notes (Signed)
Patient ID: [name], male   DOB: 12-28-1960, 56 y.o.   MRN: [id number]   [employer] PROGRESS NOTE  [name] NWG:[id number] DOB: May 14, 1961 DOA: 12/26/2016 PCP: Corky Downs, MD  HPI/Subjective: Patient is comfortable, denies any pain. No bleeding   Objective: Vitals:   01/07/17 0720 01/07/17 1444  BP: 110/76 102/72  Pulse: 72 77  Resp: 19 20  Temp: 97.8 F (36.6 C) 97.5 F (36.4 C)    Filed Weights   01/01/17 0500 01/02/17 0500 01/03/17 0556  Weight: (!) 148.8 kg (328 lb) (!) 147.4 kg (325 lb) (!) 142.9 kg (315 lb)    ROS: Review of Systems  Unable to perform ROS: Other  Sleeping  Exam: Physical Exam  Constitutional: He is oriented to person, place, and time. He is sleeping.  HENT:  Nose: No mucosal edema.  Mouth/Throat: No oropharyngeal exudate or posterior oropharyngeal edema.  Eyes: Conjunctivae, EOM and lids are normal. Pupils are equal, round, and reactive to light.  Neck: No JVD present. Carotid bruit is not present. No edema present. No thyroid mass and no thyromegaly present.  Cardiovascular: S1 normal and S2 normal.  An irregularly irregular rhythm present. Exam reveals no gallop.   No murmur heard. Pulses:      Dorsalis pedis pulses are 2+ on the right side, and 2+ on the left side.  Respiratory: No respiratory distress. He has decreased breath sounds in the right lower field. He has no wheezes. He has no rhonchi. He has no rales.  GI: Soft. Bowel sounds are normal. He exhibits fluid wave. He exhibits no distension. There is no tenderness.  Genitourinary:  Genitourinary Comments: 4+ scrotal and penis swelling  Musculoskeletal:       Right ankle: He exhibits swelling.       Left ankle: He exhibits swelling.  Lymphadenopathy:    He has no cervical adenopathy.  Neurological: He is oriented to person, place, and time. No cranial nerve deficit.  Skin: Skin is warm. Nails show no clubbing.  Large sacral decubitus requiring packing with  some necrotic material surrounding. Patient has 2 superficial areas of skin breakdown surrounding this.  Psychiatric: He has a normal mood and affect.  Decubitus ulcer exhibits no bleeding signs. Only serous drainage    Data Reviewed: Basic Metabolic Panel:  Recent Labs Lab 01/01/17 1229 01/02/17 0642 01/03/17 0443 01/05/17 0424 01/07/17 0356  NA 127* 129* 131* 132* 132*  K 4.4 4.2 4.1 3.9 3.6  CL 95* 95* 97* 97* 97*  CO2 26 29 29 31  32  GLUCOSE 164* 127* 109* 127* 141*  BUN 33* 32* 32* 25* 23*  CREATININE 1.24 1.11 1.16 0.93 0.99  CALCIUM 8.0* 8.0* 8.2* 8.0* 7.9*  MG 1.7 1.8 1.7 1.7 1.8   Liver Function Tests:  Recent Labs Lab 01/02/17 0642  ALBUMIN 1.7*    Recent Labs Lab 01/04/17 0847  AMMONIA <9*   CBC:  Recent Labs Lab 01/02/17 0642 01/05/17 0424 01/07/17 1008  WBC 13.1* 9.8  --   HGB 9.4* 9.4* 9.7*  HCT 27.7* 27.2*  --   MCV 100.2* 100.9*  --   PLT 153 128*  --     CBG:  Recent Labs Lab 01/03/17 1645 01/03/17 2122 01/04/17 0731 01/04/17 1120 01/04/17 1647  GLUCAP 120* 162* 114* 121* 158*    Recent Results (from the past 240 hour(s))  Aerobic/Anaerobic Culture (surgical/deep wound)     Status: None   Collection Time: 01/02/17 10:23 AM  Result Value Ref  Range Status   Specimen Description DECUBITIS  Final   Special Requests NONE  Final   Gram Stain   Final    MODERATE WBC PRESENT, PREDOMINANTLY PMN FEW GRAM POSITIVE COCCI IN PAIRS FEW GRAM POSITIVE RODS RARE GRAM NEGATIVE RODS    Culture   Final    MODERATE NORMAL SKIN FLORA FEW BACTEROIDES VULGATUS BETA LACTAMASE POSITIVE Performed at [facility], [address] [place], [place] [address]    Report Status 01/07/2017 FINAL  Final  C difficile quick scan w PCR reflex     Status: None   Collection Time: 01/02/17 11:24 AM  Result Value Ref Range Status   C Diff antigen NEGATIVE NEGATIVE Final   C Diff toxin NEGATIVE NEGATIVE Final   C Diff interpretation No C. difficile  detected.  Final  CULTURE, BLOOD (ROUTINE X 2) w Reflex to ID Panel     Status: None   Collection Time: 01/02/17  3:26 PM  Result Value Ref Range Status   Specimen Description BLOOD RIGHT HAND  Final   Special Requests   Final    BOTTLES DRAWN AEROBIC AND ANAEROBIC ANA14ML AER11ML   Culture NO GROWTH 5 DAYS  Final   Report Status 01/07/2017 FINAL  Final  CULTURE, BLOOD (ROUTINE X 2) w Reflex to ID Panel     Status: None   Collection Time: 01/02/17  3:27 PM  Result Value Ref Range Status   Specimen Description BLOOD LEFT ASSIST CONTROL  Final   Special Requests BOTTLES DRAWN AEROBIC AND ANAEROBIC AER9ML ANA8ML  Final   Culture NO GROWTH 5 DAYS  Final   Report Status 01/07/2017 FINAL  Final  Body fluid culture     Status: None   Collection Time: 01/03/17  2:25 PM  Result Value Ref Range Status   Specimen Description PERITONEAL  Final   Special Requests NONE  Final   Gram Stain   Final    RARE WBC PRESENT,BOTH PMN AND MONONUCLEAR NO ORGANISMS SEEN    Culture   Final    NO GROWTH 3 DAYS Performed at [facility], [address] [place], [place] [address]    Report Status 01/06/2017 FINAL  Final      Scheduled Meds: . ammonium lactate   Topical Daily  . citalopram  10 mg Oral QHS  . diphenhydrAMINE  12.5 mg Intravenous Once  . folic acid  1 mg Oral Daily  . furosemide  20 mg Oral Daily  . multivitamin with minerals  1 tablet Oral Daily  . piperacillin-tazobactam (ZOSYN)  IV  3.375 g Intravenous Q8H  . pneumococcal 23 valent vaccine  0.5 mL Intramuscular Tomorrow-1000  . potassium chloride  20 mEq Oral Daily  . protein supplement shake  11 oz Oral BID BM  . sodium chloride flush  10 mL Intravenous Q12H  . sodium chloride flush  3 mL Intravenous Q12H  . sodium hypochlorite   Irrigation BID  . sotalol  80 mg Oral BID    Assessment/Plan:   1. Hemorrhagic shock due to bleeding sacral ulcer while on anticoagulation. Acute hemorrhagic anemia. Blood pressure on the  lower side after the paracentesis of 13 L January 22. Hemoglobin stabilized. 2. Infected decubitus ulcer which is at least a stage III.  Patient seen by Dr. Sampson GoonFitzgerald and he recommended a 6 week course of IV Zosyn. Status post PICC line placement in the right arm. Hopefully patient will be approved for LTAC facility soon. Continue local wound  care. 3. Acute kidney injury on chronic kidney disease stage II continue to monitor with diuresis, remains stable  4. Hyponatremia and anasarca. Continue Lasix daily,  changed to oral route. Blood pressure little bit too low for Aldactone at this point.Sodium level has improved over the past week and remains stable  5. Chronic atrial fibrillation on sotalol. Hold anticoagulation secondary to bleeding.  6. History of chronic diastolic congestive heart failure. Lasix for anasarca, initiate spironolactone when blood pressure tolerates. 7. Alcoholic liver cirrhosis with ascites. Second paracentesis this hospitalization 1.22.18 with greater than 13 L taken off.Follow blood pressure readings closely as well as urinary output and kidney function , stable so far 8. Weakness. Physical therapy recommends rehabilitation. Hopefully LTAC placement within the next few days  Code Status:     Code Status Orders        Start     Ordered   12/27/16 4098  Full code  Continuous     12/27/16 0632    Code Status History    Date Active Date Inactive Code Status Order ID Comments User Context   12/15/2016  8:06 PM 12/21/2016  4:29 PM Full Code [id number]  Altamese Dilling, MD ED   04/17/2016  7:26 PM 04/19/2016  5:50 PM Full Code [id number]  Wyatt Haste, MD ED     Family Communication:  Disposition Plan: Hopefully insurance will cover LTAC facility  Consultants:  Psychiatry  Gen. Surgery  Gastroenterology  Antibiotics:  Zosyn  Time spent: 30 minutes  [employer]

## 2017-01-07 NOTE — Progress Notes (Signed)
OT Cancellation Note  Patient Details Name: [name] MRN: [id number] DOB: 1961-07-20   Cancelled Treatment:    Reason Eval/Treat Not Completed: Patient declined, no reason specified.  Pt declined OT this date due to fatigue.  Will continue OT as tolerated.  Susanne BordersSusan Bobby Barton, OTR/L ascom [phone] 01/07/17, 4:55 PM

## 2017-01-07 NOTE — Progress Notes (Signed)
PT Cancellation Note  Patient Details Name: [name] MRN: [id number] DOB: 1961-06-13   Cancelled Treatment:    Reason Eval/Treat Not Completed: Patient declined, no reason specified Pt refused PT this afternoon. Reports "The doctor just gave me some news that I didn't expect and I just want to get out of there, that's all I want." Pt states that he will try next time with PT but just isn't up to it today.  [name] 01/07/2017, 5:11 PM

## 2017-01-07 NOTE — Progress Notes (Signed)
MEDICATION RELATED CONSULT NOTE  Pharmacy Consult for assisting in electrolyte monitoring/replacement  Assessment: 55yoM on home apixaban, who presents with sacral bleeding given KCENTRA. Pt found to be septic. Pharmacy consulted for electrolyte monitoring. Patient receiving potassium PO daily.   Plan:  Will continue to follow electrolytes.  Allergies  Allergen Reactions  . Clindamycin/Lincomycin Diarrhea    Patient Measurements: Height: 6\' 4"  (193 cm) Weight: (!) 315 lb (142.9 kg) IBW/kg (Calculated) : 86.8  Vital Signs: Temp: 97.5 F (36.4 C) (01/26 1444) Temp Source: Oral (01/26 1444) BP: 102/72 (01/26 1444) Pulse Rate: 77 (01/26 1444) Intake/Output from previous day: 01/25 0701 - 01/26 0700 In: 413 [P.O.:360; I.V.:3; IV Piggyback:50] Out: 625 [Urine:625] Intake/Output from this shift: Total I/O In: 120 [P.O.:120] Out: 300 [Urine:300]  Labs:  Recent Labs  01/05/17 0424 01/07/17 0356 01/07/17 1008  WBC 9.8  --   --   HGB 9.4*  --  9.7*  HCT 27.2*  --   --   PLT 128*  --   --   CREATININE 0.93 0.99  --   MG 1.7 1.8  --    Estimated Creatinine Clearance: 128.7 mL/min (by C-G formula based on SCr of 0.99 mg/dL).   Microbiology: Recent Results (from the past 720 hour(s))  Body fluid culture     Status: None   Collection Time: 12/17/16  1:15 PM  Result Value Ref Range Status   Specimen Description PERITONEAL  Final   Special Requests NONE  Final   Gram Stain   Final    FEW WBC PRESENT,BOTH PMN AND MONONUCLEAR NO ORGANISMS SEEN    Culture   Final    No growth aerobically or anaerobically. Performed at [facility]    Report Status 12/20/2016 FINAL  Final  Culture, blood (routine x 2)     Status: Abnormal   Collection Time: 12/27/16 12:40 AM  Result Value Ref Range Status   Specimen Description BLOOD RIGHT HAND  Final   Special Requests   Final    BOTTLES DRAWN AEROBIC AND ANAEROBIC AERO5ML,ANAERO4ML   Culture  Setup Time   Final     GRAM POSITIVE RODS ANAEROBIC BOTTLE ONLY CRITICAL VALUE NOTED.  VALUE IS CONSISTENT WITH PREVIOUSLY REPORTED AND CALLED VALUE.    Culture (A)  Final    ANAEROBIC GRAM POSITIVE RODS UNABLE TO FURTHER IDENTIFY. CORRECTED RESULTS CALLED TO: A ROBINSON,RN AT 1610 01/03/17 BY L BENFIELD PREVIOUSLY REPORTED AS DIPTHEROIDS Performed at [facility], [address], [place], [place] [address]    Report Status 01/03/2017 FINAL  Final  Culture, blood (routine x 2)     Status: Abnormal   Collection Time: 12/27/16 12:40 AM  Result Value Ref Range Status   Specimen Description BLOOD LEFT HAND  Final   Special Requests   Final    BOTTLES DRAWN AEROBIC AND ANAEROBIC AERO3ML,ANAERO10ML   Culture  Setup Time   Final    GRAM POSITIVE RODS ANAEROBIC BOTTLE ONLY CRITICAL RESULT CALLED TO, READ BACK BY AND VERIFIED WITH: HANK ZOMPA 12/29/16 0900 SGD    Culture (A)  Final    ANAEROBIC GRAM POSITIVE RODS UNABLE TO FURTHER IDENTIFY. Performed at [facility], [address] [place], [place] [address]    Report Status 01/02/2017 FINAL  Final  Blood Culture ID Panel (Reflexed)     Status: None   Collection Time: 12/27/16 12:40 AM  Result Value Ref Range Status   Enterococcus species NOT DETECTED NOT DETECTED Final  Listeria monocytogenes NOT DETECTED NOT DETECTED Final   Staphylococcus species NOT DETECTED NOT DETECTED Final   Staphylococcus aureus NOT DETECTED NOT DETECTED Final   Streptococcus species NOT DETECTED NOT DETECTED Final   Streptococcus agalactiae NOT DETECTED NOT DETECTED Final   Streptococcus pneumoniae NOT DETECTED NOT DETECTED Final   Streptococcus pyogenes NOT DETECTED NOT DETECTED Final   Acinetobacter baumannii NOT DETECTED NOT DETECTED Final   Enterobacteriaceae species NOT DETECTED NOT DETECTED Final   Enterobacter cloacae complex NOT DETECTED NOT DETECTED Final   Escherichia coli NOT DETECTED NOT DETECTED Final   Klebsiella oxytoca NOT DETECTED NOT DETECTED Final    Klebsiella pneumoniae NOT DETECTED NOT DETECTED Final   Proteus species NOT DETECTED NOT DETECTED Final   Serratia marcescens NOT DETECTED NOT DETECTED Final   Haemophilus influenzae NOT DETECTED NOT DETECTED Final   Neisseria meningitidis NOT DETECTED NOT DETECTED Final   Pseudomonas aeruginosa NOT DETECTED NOT DETECTED Final   Candida albicans NOT DETECTED NOT DETECTED Final   Candida glabrata NOT DETECTED NOT DETECTED Final   Candida krusei NOT DETECTED NOT DETECTED Final   Candida parapsilosis NOT DETECTED NOT DETECTED Final   Candida tropicalis NOT DETECTED NOT DETECTED Final  MRSA PCR Screening     Status: None   Collection Time: 12/27/16  6:30 AM  Result Value Ref Range Status   MRSA by PCR NEGATIVE NEGATIVE Final    Comment:        The GeneXpert MRSA Assay (FDA approved for NASAL specimens only), is one component of a comprehensive MRSA colonization surveillance program. It is not intended to diagnose MRSA infection nor to guide or monitor treatment for MRSA infections.   Urine culture     Status: None   Collection Time: 12/27/16 11:32 AM  Result Value Ref Range Status   Specimen Description URINE, CLEAN CATCH  Final   Special Requests NONE  Final   Culture   Final    NO GROWTH Performed at [facility], [address], [place], [place] [address]    Report Status 12/28/2016 FINAL  Final  Aerobic/Anaerobic Culture (surgical/deep wound)     Status: None (Preliminary result)   Collection Time: 01/02/17 10:23 AM  Result Value Ref Range Status   Specimen Description DECUBITIS  Final   Special Requests NONE  Final   Gram Stain   Final    MODERATE WBC PRESENT, PREDOMINANTLY PMN FEW GRAM POSITIVE COCCI IN PAIRS FEW GRAM POSITIVE RODS RARE GRAM NEGATIVE RODS    Culture   Final    MODERATE NORMAL SKIN FLORA FEW BACTEROIDES VULGATUS BETA LACTAMASE POSITIVE Performed at [facility], [address], [place], [place] [address]    Report Status PENDING   Incomplete  C difficile quick scan w PCR reflex     Status: None   Collection Time: 01/02/17 11:24 AM  Result Value Ref Range Status   C Diff antigen NEGATIVE NEGATIVE Final   C Diff toxin NEGATIVE NEGATIVE Final   C Diff interpretation No C. difficile detected.  Final  CULTURE, BLOOD (ROUTINE X 2) w Reflex to ID Panel     Status: None   Collection Time: 01/02/17  3:26 PM  Result Value Ref Range Status   Specimen Description BLOOD RIGHT HAND  Final   Special Requests   Final    BOTTLES DRAWN AEROBIC AND ANAEROBIC ANA14ML AER11ML   Culture NO GROWTH 5 DAYS  Final   Report Status 01/07/2017 FINAL  Final  CULTURE,  BLOOD (ROUTINE X 2) w Reflex to ID Panel     Status: None   Collection Time: 01/02/17  3:27 PM  Result Value Ref Range Status   Specimen Description BLOOD LEFT ASSIST CONTROL  Final   Special Requests BOTTLES DRAWN AEROBIC AND ANAEROBIC AER9ML ANA8ML  Final   Culture NO GROWTH 5 DAYS  Final   Report Status 01/07/2017 FINAL  Final  Body fluid culture     Status: None   Collection Time: 01/03/17  2:25 PM  Result Value Ref Range Status   Specimen Description PERITONEAL  Final   Special Requests NONE  Final   Gram Stain   Final    RARE WBC PRESENT,BOTH PMN AND MONONUCLEAR NO ORGANISMS SEEN    Culture   Final    NO GROWTH 3 DAYS Performed at [facility], [address] [place], [place] [address]    Report Status 01/06/2017 FINAL  Final    Antibiotics: Cipro 1/15 >>1/15 Vancomycin 1/15 >>1/15 Piperacillin/tazo 1/15 >> 1/22  Cultures: 1/21 BCx: sent 1/15 BCx: 1/4 bottles + anaerobic GPR= DIPHTHEROIDS(CORYNEBACTERIUM SPECIES)  1/15 BCx: NGTD 1/15 MRSA PCR: negative  1/15 UCx: NG  Luisa HartScott Yoshua Geisinger, PharmD Clinical Pharmacist  01/07/2017 3:38 PM

## 2017-01-08 DIAGNOSIS — L89153 Pressure ulcer of sacral region, stage 3: Secondary | ICD-10-CM

## 2017-01-08 MED ORDER — MIDODRINE HCL 5 MG PO TABS
5.0000 mg | ORAL_TABLET | Freq: Three times a day (TID) | ORAL | Status: DC
  Administered 2017-01-08 – 2017-01-14 (×17): 5 mg via ORAL
  Filled 2017-01-08 (×18): qty 1

## 2017-01-08 MED ORDER — DAKINS (1/4 STRENGTH) 0.125 % EX SOLN
Freq: Two times a day (BID) | CUTANEOUS | Status: DC
  Administered 2017-01-09 – 2017-01-14 (×11)
  Filled 2017-01-08: qty 473

## 2017-01-08 NOTE — Progress Notes (Signed)
Patient ID: [name], male   DOB: 26-Aug-1961, 56 y.o.   MRN: [id number]   [employer] PROGRESS NOTE  [name] WUJ:[id number] DOB: 08-02-1961 DOA: 12/26/2016 PCP: Corky Downs, MD  HPI/Subjective: Pt had debreidment fo decubitus done by surgery, he is confused  Objective: Vitals:   01/08/17 0744 01/08/17 1237  BP: 104/75 94/69  Pulse: 80 76  Resp: 17 18  Temp: 97.9 F (36.6 C) 97.5 F (36.4 C)    Filed Weights   01/02/17 0500 01/03/17 0556 01/08/17 0335  Weight: (!) 325 lb (147.4 kg) (!) 315 lb (142.9 kg) (!) 302 lb (137 kg)    ROS: Review of Systems  Unable to perform ROS: Other  Sleeping  Exam: Physical Exam  Constitutional: He is oriented to person, place, and time. He is sleeping.  HENT:  Nose: No mucosal edema.  Mouth/Throat: No oropharyngeal exudate or posterior oropharyngeal edema.  Eyes: Conjunctivae, EOM and lids are normal. Pupils are equal, round, and reactive to light.  Neck: No JVD present. Carotid bruit is not present. No edema present. No thyroid mass and no thyromegaly present.  Cardiovascular: S1 normal and S2 normal.  An irregularly irregular rhythm present. Exam reveals no gallop.   No murmur heard. Pulses:      Dorsalis pedis pulses are 2+ on the right side, and 2+ on the left side.  Respiratory: No respiratory distress. He has decreased breath sounds in the right lower field. He has no wheezes. He has no rhonchi. He has no rales.  GI: Soft. Bowel sounds are normal. He exhibits fluid wave. He exhibits no distension. There is no tenderness.  Genitourinary:  Genitourinary Comments: 4+ scrotal and penis swelling  Musculoskeletal:       Right ankle: He exhibits swelling.       Left ankle: He exhibits swelling.  Lymphadenopathy:    He has no cervical adenopathy.  Neurological: He is oriented to person, place, and time. No cranial nerve deficit.  Skin: Skin is warm. Nails show no clubbing.  Large sacral decubitus  With packing with  some necrotic material surrounding.  Marland Kitchen  Psychiatric: He has a normal mood and affect.  Decubitus ulcer exhibits no bleeding signs. Only serous drainage    Data Reviewed: Basic Metabolic Panel:  Recent Labs Lab 01/02/17 0642 01/03/17 0443 01/05/17 0424 01/07/17 0356  NA 129* 131* 132* 132*  K 4.2 4.1 3.9 3.6  CL 95* 97* 97* 97*  CO2 29 29 31  32  GLUCOSE 127* 109* 127* 141*  BUN 32* 32* 25* 23*  CREATININE 1.11 1.16 0.93 0.99  CALCIUM 8.0* 8.2* 8.0* 7.9*  MG 1.8 1.7 1.7 1.8   Liver Function Tests:  Recent Labs Lab 01/02/17 0642  ALBUMIN 1.7*    Recent Labs Lab 01/04/17 0847  AMMONIA <9*   CBC:  Recent Labs Lab 01/02/17 0642 01/05/17 0424 01/07/17 1008  WBC 13.1* 9.8  --   HGB 9.4* 9.4* 9.7*  HCT 27.7* 27.2*  --   MCV 100.2* 100.9*  --   PLT 153 128*  --     CBG:  Recent Labs Lab 01/03/17 1645 01/03/17 2122 01/04/17 0731 01/04/17 1120 01/04/17 1647  GLUCAP 120* 162* 114* 121* 158*    Recent Results (from the past 240 hour(s))  Aerobic/Anaerobic Culture (surgical/deep wound)     Status: None   Collection Time: 01/02/17 10:23 AM  Result Value Ref Range Status   Specimen Description DECUBITIS  Final   Special Requests NONE  Final  Gram Stain   Final    MODERATE WBC PRESENT, PREDOMINANTLY PMN FEW GRAM POSITIVE COCCI IN PAIRS FEW GRAM POSITIVE RODS RARE GRAM NEGATIVE RODS    Culture   Final    MODERATE NORMAL SKIN FLORA FEW BACTEROIDES VULGATUS BETA LACTAMASE POSITIVE Performed at [facility], [address] [place], [place] [address]    Report Status 01/07/2017 FINAL  Final  C difficile quick scan w PCR reflex     Status: None   Collection Time: 01/02/17 11:24 AM  Result Value Ref Range Status   C Diff antigen NEGATIVE NEGATIVE Final   C Diff toxin NEGATIVE NEGATIVE Final   C Diff interpretation No C. difficile detected.  Final  CULTURE, BLOOD (ROUTINE X 2) w Reflex to ID Panel     Status: None   Collection Time: 01/02/17   3:26 PM  Result Value Ref Range Status   Specimen Description BLOOD RIGHT HAND  Final   Special Requests   Final    BOTTLES DRAWN AEROBIC AND ANAEROBIC ANA14ML AER11ML   Culture NO GROWTH 5 DAYS  Final   Report Status 01/07/2017 FINAL  Final  CULTURE, BLOOD (ROUTINE X 2) w Reflex to ID Panel     Status: None   Collection Time: 01/02/17  3:27 PM  Result Value Ref Range Status   Specimen Description BLOOD LEFT ASSIST CONTROL  Final   Special Requests BOTTLES DRAWN AEROBIC AND ANAEROBIC AER9ML ANA8ML  Final   Culture NO GROWTH 5 DAYS  Final   Report Status 01/07/2017 FINAL  Final  Body fluid culture     Status: None   Collection Time: 01/03/17  2:25 PM  Result Value Ref Range Status   Specimen Description PERITONEAL  Final   Special Requests NONE  Final   Gram Stain   Final    RARE WBC PRESENT,BOTH PMN AND MONONUCLEAR NO ORGANISMS SEEN    Culture   Final    NO GROWTH 3 DAYS Performed at [facility], [address] [place], [place] [address]    Report Status 01/06/2017 FINAL  Final      Scheduled Meds: . ammonium lactate   Topical Daily  . citalopram  10 mg Oral QHS  . diphenhydrAMINE  12.5 mg Intravenous Once  . folic acid  1 mg Oral Daily  . furosemide  20 mg Oral Daily  . multivitamin with minerals  1 tablet Oral Daily  . piperacillin-tazobactam (ZOSYN)  IV  3.375 g Intravenous Q8H  . pneumococcal 23 valent vaccine  0.5 mL Intramuscular Tomorrow-1000  . potassium chloride  20 mEq Oral Daily  . protein supplement shake  11 oz Oral BID BM  . sodium chloride flush  10 mL Intravenous Q12H  . sodium chloride flush  3 mL Intravenous Q12H  . sotalol  80 mg Oral BID    Assessment/Plan:   1. Hemorrhagic shock due to bleeding sacral ulcer while on anticoagulation. Acute hemorrhagic anemia. Currently stable 2. Infected decubitus ulcer which is at least a stage III.  Patient seen by Dr. Sampson GoonFitzgerald and he recommended a 6 week course of IV Zosyn. Status post PICC line  placement in the right arm. Seen by surgery and they debrided the wound will need prolonged wound care and IV antibiotics 3. Acute kidney injury on chronic kidney disease stage II continue to monitor with diuresis, renal function stable 4. Hyponatremia and anasarca. Continue Lasix daily,  changed to oral route.  5. Chronic atrial fibrillation on  sotalol. Hold anticoagulation secondary to bleeding.  6. History of chronic diastolic congestive heart failure. Lasix for anasarca, initiate spironolactone when blood pressure tolerates. 7. Alcoholic liver cirrhosis with ascites. May need paracentesis again 8. Weakness. Physical therapy recommends rehabilitation. Hopefully LTAC placement within the next few days  Code Status:     Code Status Orders        Start     Ordered   12/27/16 1610  Full code  Continuous     12/27/16 0632    Code Status History    Date Active Date Inactive Code Status Order ID Comments User Context   12/15/2016  8:06 PM 12/21/2016  4:29 PM Full Code [id number]  Altamese Dilling, MD ED   04/17/2016  7:26 PM 04/19/2016  5:50 PM Full Code [id number]  Wyatt Haste, MD ED     Family Communication:  Disposition Plan: Hopefully insurance will cover LTAC facility  Consultants:  Psychiatry  Gen. Surgery  Gastroenterology  Antibiotics:  Zosyn  Time spent: 30 minutes  [name], [facility]  [employer]

## 2017-01-08 NOTE — Progress Notes (Addendum)
CC: Decubitus ulcers Subjective: Patient without acute events overnight. Still states a desire to leave the hospital. Feeling well.  Objective: Vital signs in last 24 hours: Temp:  [97.3 F (36.3 C)-97.9 F (36.6 C)] 97.9 F (36.6 C) (01/27 0744) Pulse Rate:  [77-83] 80 (01/27 0744) Resp:  [14-20] 17 (01/27 0744) BP: (97-104)/(69-75) 104/75 (01/27 0744) SpO2:  [92 %-99 %] 99 % (01/27 0744) Weight:  [137 kg (302 lb)] 137 kg (302 lb) (01/27 0335) Last BM Date: 01/08/17  Intake/Output from previous day: 01/26 0701 - 01/27 0700 In: 170 [P.O.:120; IV Piggyback:50] Out: 650 [Urine:650] Intake/Output this shift: Total I/O In: 240 [P.O.:240] Out: 125 [Urine:125]  Physical exam:  Gen.: No acute distress Chest: Clear to auscultation Heart: Regular rhythm Abdomen: Very large and soft Rectum: 2 separate decubitus ulcers. The near perirectal one continues to be pink and without evidence of spreading erythema or purulence. The larger sacral decub near the level of the coccyx with areas of necrosis on the cephalad aspect.  Lab Results: CBC   Recent Labs  01/07/17 1008  HGB 9.7*   BMET  Recent Labs  01/07/17 0356  NA 132*  K 3.6  CL 97*  CO2 32  GLUCOSE 141*  BUN 23*  CREATININE 0.99  CALCIUM 7.9*   PT/INR No results for input(s): LABPROT, INR in the last 72 hours. ABG No results for input(s): PHART, HCO3 in the last 72 hours.  Invalid input(s): PCO2, PO2  Studies/Results: No results found.  Anti-infectives: Anti-infectives    Start     Dose/Rate Route Frequency Ordered Stop   01/05/17 0930  piperacillin-tazobactam (ZOSYN) IVPB 3.375 g     3.375 g 12.5 mL/hr over 240 Minutes Intravenous Every 8 hours 01/05/17 0916     01/01/17 1415  piperacillin-tazobactam (ZOSYN) IVPB 4.5 g     4.5 g 25 mL/hr over 240 Minutes Intravenous Every 8 hours 01/01/17 1401 01/03/17 1017   12/27/16 0800  ciprofloxacin (CIPRO) tablet 500 mg  Status:  Discontinued     500 mg Oral 2  times daily 12/27/16 [id number] 12/27/16 1046   12/27/16 0800  vancomycin (VANCOCIN) 1,500 mg in sodium chloride 0.9 % 500 mL IVPB  Status:  Discontinued     1,500 mg 250 mL/hr over 120 Minutes Intravenous Every 12 hours 12/27/16 0641 12/27/16 1046   12/27/16 0645  piperacillin-tazobactam (ZOSYN) IVPB 4.5 g     4.5 g 25 mL/hr over 240 Minutes Intravenous Every 8 hours 12/27/16 0634 01/01/17 0933   12/27/16 0030  vancomycin (VANCOCIN) IVPB 1000 mg/200 mL premix     1,000 mg 200 mL/hr over 60 Minutes Intravenous  Once 12/27/16 0026 12/27/16 0240   12/27/16 0030  piperacillin-tazobactam (ZOSYN) IVPB 3.375 g     3.375 g 100 mL/hr over 30 Minutes Intravenous  Once 12/27/16 0026 12/27/16 0136      Assessment/Plan:  56 year old male with numerous medical problems and sacral decubitus ulcers. The larger ulcer with necrotic material in it was sharply debrided at the bedside today. There continues to be adherent necrotic tissue to the most cephalad walls of the ulcer that were left alone. The tissue removed measured approximately 3 x 2 x 2 cm and was taken down to the level of the muscle. It was insensate to the patient. There is no spreading erythema or current purulent drainage even with sharp debridement. Continue local wound care with Dakin soaked gauze and pressure offloading as possible. No plans for any further surgical debridement at this time.  Artia Singley T. Tonita Cong, MD, FACS  01/08/2017

## 2017-01-09 LAB — BASIC METABOLIC PANEL
ANION GAP: 3 — AB (ref 5–15)
BUN: 22 mg/dL — ABNORMAL HIGH (ref 6–20)
CHLORIDE: 96 mmol/L — AB (ref 101–111)
CO2: 32 mmol/L (ref 22–32)
Calcium: 8 mg/dL — ABNORMAL LOW (ref 8.9–10.3)
Creatinine, Ser: 0.94 mg/dL (ref 0.61–1.24)
GFR calc non Af Amer: >60 mL/min (ref >60)
Glucose, Bld: 128 mg/dL — ABNORMAL HIGH (ref 65–99)
POTASSIUM: 4.3 mmol/L (ref 3.5–5.1)
Sodium: 131 mmol/L — ABNORMAL LOW (ref 135–145)

## 2017-01-09 LAB — CBC
HEMATOCRIT: 28 % — AB (ref 40.0–52.0)
HEMOGLOBIN: 9.5 g/dL — AB (ref 13.0–18.0)
MCH: 34.9 pg — ABNORMAL HIGH (ref 26.0–34.0)
MCHC: 34 g/dL (ref 32.0–36.0)
MCV: 102.5 fL — ABNORMAL HIGH (ref 80.0–100.0)
Platelets: 97 10*3/uL — ABNORMAL LOW (ref 150–440)
RBC: 2.74 MIL/uL — AB (ref 4.40–5.90)
RDW: 18.4 % — ABNORMAL HIGH (ref 11.5–14.5)
WBC: 9.4 10*3/uL (ref 3.8–10.6)

## 2017-01-09 MED ORDER — SPIRONOLACTONE 25 MG PO TABS
12.5000 mg | ORAL_TABLET | Freq: Every day | ORAL | Status: DC
  Administered 2017-01-09 – 2017-01-14 (×6): 12.5 mg via ORAL
  Filled 2017-01-09 (×6): qty 1

## 2017-01-09 NOTE — Progress Notes (Signed)
  Patient revisited with this morning. No acute changes Decubitus ulcers have not progressed and wounds appear stable.  No plans for any further debridement at this time. Continue Dakin's soaked dressings until area wounds are clean  These call again if general surgery can be of further assistance with this patient's care.  Ricarda Frameharles Shiela Bruns, MD [facility] General Surgeon [facility] 7a-7p - ASCOM [id number] 7p-7a - Arnette FeltsASCOM [phone]

## 2017-01-09 NOTE — Progress Notes (Signed)
Patient ID: [name], male   DOB: 05-20-1961, 56 y.o.   MRN: [id number]   [employer] PROGRESS NOTE  [name] NWG:[id number] DOB: 1961/08/23 DOA: 12/26/2016 PCP: Corky Downs, MD  HPI/Subjective: Patient has no complaints. Remains same  Objective: Vitals:   01/09/17 0540 01/09/17 0749  BP: 110/70 105/77  Pulse: 79 70  Resp: (!) 21 20  Temp: 97.9 F (36.6 C) 97.8 F (36.6 C)    Filed Weights   01/03/17 0556 01/08/17 0335 01/09/17 0500  Weight: (!) 315 lb (142.9 kg) (!) 302 lb (137 kg) 278 lb (126.1 kg)    ROS: Review of Systems  Unable to perform ROS: Other  Sleeping  Exam: Physical Exam  Constitutional: He is oriented to person, place, and time. He is sleeping.  HENT:  Nose: No mucosal edema.  Mouth/Throat: No oropharyngeal exudate or posterior oropharyngeal edema.  Eyes: Conjunctivae, EOM and lids are normal. Pupils are equal, round, and reactive to light.  Neck: No JVD present. Carotid bruit is not present. No edema present. No thyroid mass and no thyromegaly present.  Cardiovascular: S1 normal and S2 normal.  An irregularly irregular rhythm present. Exam reveals no gallop.   No murmur heard. Pulses:      Dorsalis pedis pulses are 2+ on the right side, and 2+ on the left side.  Respiratory: No respiratory distress. He has decreased breath sounds in the right lower field. He has no wheezes. He has no rhonchi. He has no rales.  GI: Soft. Bowel sounds are normal. He exhibits fluid wave. He exhibits no distension. There is no tenderness.  Genitourinary:  Genitourinary Comments: 4+ scrotal and penis swelling  Musculoskeletal:       Right ankle: He exhibits swelling.       Left ankle: He exhibits swelling.  Lymphadenopathy:    He has no cervical adenopathy.  Neurological: He is oriented to person, place, and time. No cranial nerve deficit.  Skin: Skin is warm. Nails show no clubbing.  Large sacral decubitus  With packing with some necrotic material  surrounding.  Marland Kitchen  Psychiatric: He has a normal mood and affect.  Decubitus ulcer exhibits no bleeding signs. Only serous drainage    Data Reviewed: Basic Metabolic Panel:  Recent Labs Lab 01/03/17 0443 01/05/17 0424 01/07/17 0356 01/09/17 0345  NA 131* 132* 132* 131*  K 4.1 3.9 3.6 4.3  CL 97* 97* 97* 96*  CO2 29 31 32 32  GLUCOSE 109* 127* 141* 128*  BUN 32* 25* 23* 22*  CREATININE 1.16 0.93 0.99 0.94  CALCIUM 8.2* 8.0* 7.9* 8.0*  MG 1.7 1.7 1.8  --    Liver Function Tests: No results for input(s): AST, ALT, ALKPHOS, BILITOT, PROT, ALBUMIN in the last 168 hours.  Recent Labs Lab 01/04/17 0847  AMMONIA <9*   CBC:  Recent Labs Lab 01/05/17 0424 01/07/17 1008 01/09/17 0345  WBC 9.8  --  9.4  HGB 9.4* 9.7* 9.5*  HCT 27.2*  --  28.0*  MCV 100.9*  --  102.5*  PLT 128*  --  97*    CBG:  Recent Labs Lab 01/03/17 1645 01/03/17 2122 01/04/17 0731 01/04/17 1120 01/04/17 1647  GLUCAP 120* 162* 114* 121* 158*    Recent Results (from the past 240 hour(s))  Aerobic/Anaerobic Culture (surgical/deep wound)     Status: None   Collection Time: 01/02/17 10:23 AM  Result Value Ref Range Status   Specimen Description DECUBITIS  Final   Special Requests NONE  Final   Gram Stain   Final    MODERATE WBC PRESENT, PREDOMINANTLY PMN FEW GRAM POSITIVE COCCI IN PAIRS FEW GRAM POSITIVE RODS RARE GRAM NEGATIVE RODS    Culture   Final    MODERATE NORMAL SKIN FLORA FEW BACTEROIDES VULGATUS BETA LACTAMASE POSITIVE Performed at [facility], [address] [place], [place] [address]    Report Status 01/07/2017 FINAL  Final  C difficile quick scan w PCR reflex     Status: None   Collection Time: 01/02/17 11:24 AM  Result Value Ref Range Status   C Diff antigen NEGATIVE NEGATIVE Final   C Diff toxin NEGATIVE NEGATIVE Final   C Diff interpretation No C. difficile detected.  Final  CULTURE, BLOOD (ROUTINE X 2) w Reflex to ID Panel     Status: None   Collection  Time: 01/02/17  3:26 PM  Result Value Ref Range Status   Specimen Description BLOOD RIGHT HAND  Final   Special Requests   Final    BOTTLES DRAWN AEROBIC AND ANAEROBIC ANA14ML AER11ML   Culture NO GROWTH 5 DAYS  Final   Report Status 01/07/2017 FINAL  Final  CULTURE, BLOOD (ROUTINE X 2) w Reflex to ID Panel     Status: None   Collection Time: 01/02/17  3:27 PM  Result Value Ref Range Status   Specimen Description BLOOD LEFT ASSIST CONTROL  Final   Special Requests BOTTLES DRAWN AEROBIC AND ANAEROBIC AER9ML ANA8ML  Final   Culture NO GROWTH 5 DAYS  Final   Report Status 01/07/2017 FINAL  Final  Body fluid culture     Status: None   Collection Time: 01/03/17  2:25 PM  Result Value Ref Range Status   Specimen Description PERITONEAL  Final   Special Requests NONE  Final   Gram Stain   Final    RARE WBC PRESENT,BOTH PMN AND MONONUCLEAR NO ORGANISMS SEEN    Culture   Final    NO GROWTH 3 DAYS Performed at [facility], [address] [place], [place] [address]    Report Status 01/06/2017 FINAL  Final      Scheduled Meds: . ammonium lactate   Topical Daily  . citalopram  10 mg Oral QHS  . diphenhydrAMINE  12.5 mg Intravenous Once  . folic acid  1 mg Oral Daily  . furosemide  20 mg Oral Daily  . midodrine  5 mg Oral TID WC  . multivitamin with minerals  1 tablet Oral Daily  . piperacillin-tazobactam (ZOSYN)  IV  3.375 g Intravenous Q8H  . pneumococcal 23 valent vaccine  0.5 mL Intramuscular Tomorrow-1000  . potassium chloride  20 mEq Oral Daily  . protein supplement shake  11 oz Oral BID BM  . sodium chloride flush  10 mL Intravenous Q12H  . sodium chloride flush  3 mL Intravenous Q12H  . sodium hypochlorite   Irrigation BID  . sotalol  80 mg Oral BID    Assessment/Plan:   1. Hemorrhagic shock due to bleeding sacral ulcer while on anticoagulation. Acute hemorrhagic anemia.Hemoglobin has been stable we'll repeat in the morning 2. Infected decubitus ulcer which is at  least a stage III.  Patient seen by Dr. Sampson GoonFitzgerald and he recommended a 6 week course of IV Zosyn. . Seen by surgery and they debrided the wound yesterday, he will need prolonged wound care and IV antibiotics 3. Acute kidney injury on chronic kidney disease stage II continue to monitor with diuresis, renal  function stable 4. Hyponatremia and anasarca. Continue Lasix orally daily 5. Chronic atrial fibrillation on sotalol. Hold anticoagulation secondary to bleeding.  6. History of chronic diastolic congestive heart failure. Lasix for anasarca, start spinal lactone  7. Alcoholic liver cirrhosis with ascites. May need paracentesis again 8. Weakness. Physical therapy recommends rehabilitation. Hopefully LTAC placement within the next few days  Code Status:     Code Status Orders        Start     Ordered   12/27/16 4010  Full code  Continuous     12/27/16 0632    Code Status History    Date Active Date Inactive Code Status Order ID Comments User Context   12/15/2016  8:06 PM 12/21/2016  4:29 PM Full Code [id number]  Altamese Dilling, MD ED   04/17/2016  7:26 PM 04/19/2016  5:50 PM Full Code [id number]  Wyatt Haste, MD ED     Family Communication:  Disposition Plan: Hopefully insurance will cover LTAC facility  Consultants:  Psychiatry  Gen. Surgery  Gastroenterology  Antibiotics:  Zosyn  Time spent: 30 minutes  Raney Antwine, [facility]  [employer]

## 2017-01-10 LAB — CBC
HEMATOCRIT: 27.8 % — AB (ref 40.0–52.0)
Hemoglobin: 9.6 g/dL — ABNORMAL LOW (ref 13.0–18.0)
MCH: 35 pg — ABNORMAL HIGH (ref 26.0–34.0)
MCHC: 34.4 g/dL (ref 32.0–36.0)
MCV: 102 fL — AB (ref 80.0–100.0)
PLATELETS: 80 10*3/uL — AB (ref 150–440)
RBC: 2.73 MIL/uL — AB (ref 4.40–5.90)
RDW: 18.7 % — ABNORMAL HIGH (ref 11.5–14.5)
WBC: 8 10*3/uL (ref 3.8–10.6)

## 2017-01-10 LAB — BASIC METABOLIC PANEL
Anion gap: 2 — ABNORMAL LOW (ref 5–15)
BUN: 20 mg/dL (ref 6–20)
CHLORIDE: 96 mmol/L — AB (ref 101–111)
CO2: 34 mmol/L — ABNORMAL HIGH (ref 22–32)
CREATININE: 0.85 mg/dL (ref 0.61–1.24)
Calcium: 8 mg/dL — ABNORMAL LOW (ref 8.9–10.3)
GFR calc non Af Amer: >60 mL/min (ref >60)
Glucose, Bld: 103 mg/dL — ABNORMAL HIGH (ref 65–99)
POTASSIUM: 4.3 mmol/L (ref 3.5–5.1)
SODIUM: 132 mmol/L — AB (ref 135–145)

## 2017-01-10 MED ORDER — DOCUSATE SODIUM 100 MG PO CAPS
200.0000 mg | ORAL_CAPSULE | Freq: Two times a day (BID) | ORAL | Status: DC
  Administered 2017-01-10 (×2): 200 mg via ORAL
  Filled 2017-01-10 (×2): qty 2

## 2017-01-10 MED ORDER — SENNA 8.6 MG PO TABS
1.0000 | ORAL_TABLET | Freq: Every day | ORAL | Status: DC
  Administered 2017-01-10: 8.6 mg via ORAL
  Filled 2017-01-10: qty 1

## 2017-01-10 MED ORDER — PREMIER PROTEIN SHAKE
11.0000 [oz_av] | Freq: Two times a day (BID) | ORAL | Status: DC
  Administered 2017-01-11 – 2017-01-13 (×5): 11 [oz_av] via ORAL

## 2017-01-10 MED ORDER — POLYETHYLENE GLYCOL 3350 17 G PO PACK
17.0000 g | PACK | Freq: Every day | ORAL | Status: DC
  Filled 2017-01-10: qty 1

## 2017-01-10 MED ORDER — ENSURE ENLIVE PO LIQD
237.0000 mL | ORAL | Status: DC
  Administered 2017-01-11 – 2017-01-12 (×2): 237 mL via ORAL

## 2017-01-10 NOTE — Progress Notes (Signed)
[facility] INFECTIOUS DISEASE PROGRESS NOTE Date of Admission:  12/26/2016     ID: [name] is a 56 y.o. male with infected decub ulcer Principal Problem:   Adjustment disorder with mixed anxiety and depressed mood Active Problems:   Sepsis (HCC)   Alcoholic cirrhosis of liver with ascites (HCC)   Decubitus ulcer of sacral region, stage 3 (HCC)   Subjective: S/p debridement bedside over weekend. No new complaints  ROS  Eleven systems are reviewed and negative except per hpi  Medications:  Antibiotics Given (last 72 hours)    Date/Time Action Medication Dose Rate   01/07/17 1730 Given   piperacillin-tazobactam (ZOSYN) IVPB 3.375 g 3.375 g 12.5 mL/hr   01/08/17 0324 Given   piperacillin-tazobactam (ZOSYN) IVPB 3.375 g 3.375 g 12.5 mL/hr   01/08/17 1237 Given   piperacillin-tazobactam (ZOSYN) IVPB 3.375 g 3.375 g 12.5 mL/hr   01/08/17 2020 Given   piperacillin-tazobactam (ZOSYN) IVPB 3.375 g 3.375 g 12.5 mL/hr   01/09/17 0402 Given   piperacillin-tazobactam (ZOSYN) IVPB 3.375 g 3.375 g 12.5 mL/hr   01/09/17 1249 Given   piperacillin-tazobactam (ZOSYN) IVPB 3.375 g 3.375 g 12.5 mL/hr   01/09/17 2122 Given   piperacillin-tazobactam (ZOSYN) IVPB 3.375 g 3.375 g 12.5 mL/hr   01/10/17 0500 Given   piperacillin-tazobactam (ZOSYN) IVPB 3.375 g 3.375 g 12.5 mL/hr     . ammonium lactate   Topical Daily  . citalopram  10 mg Oral QHS  . diphenhydrAMINE  12.5 mg Intravenous Once  . docusate sodium  200 mg Oral BID  . folic acid  1 mg Oral Daily  . furosemide  20 mg Oral Daily  . midodrine  5 mg Oral TID WC  . multivitamin with minerals  1 tablet Oral Daily  . piperacillin-tazobactam (ZOSYN)  IV  3.375 g Intravenous Q8H  . pneumococcal 23 valent vaccine  0.5 mL Intramuscular Tomorrow-1000  . polyethylene glycol  17 g Oral Daily  . potassium chloride  20 mEq Oral Daily  . protein supplement shake  11 oz Oral BID BM  . senna  1 tablet Oral Daily  . sodium chloride  flush  10 mL Intravenous Q12H  . sodium chloride flush  3 mL Intravenous Q12H  . sodium hypochlorite   Irrigation BID  . sotalol  80 mg Oral BID  . spironolactone  12.5 mg Oral Daily    Objective: Vital signs in last 24 hours: Temp:  [97.4 F (36.3 C)-97.7 F (36.5 C)] 97.7 F (36.5 C) (01/29 1204) Pulse Rate:  [65-70] 65 (01/29 1204) Resp:  [18-20] 18 (01/29 0740) BP: (104-118)/(68-84) 116/84 (01/29 1204) SpO2:  [91 %-95 %] 95 % (01/29 1204) Weight:  [126.6 kg (279 lb)] 126.6 kg (279 lb) (01/29 0500) Constitutional: He is oriented to person, place, and time. Obese, chronically ill appearing HENT: mild icterus Mouth/Throat: Oropharynx is clear and moist. No oropharyngeal exudate.  Cardiovascular: Normal rate, regular rhythm and normal heart sounds. picc rue Pulmonary/Chest: Effort normal and breath sounds normal. No respiratory distress. He has no wheezes.  Abdominal: Soft. Bowel sounds are normal. Distended, but not tense Lymphadenopathy: He has no cervical adenopathy.  Neurological: He is alert and oriented to person, place, and time.  Skin: sacrum wound - quite deep but clean base after debridement. Bone just palpable under thin layer of tissue Psychiatric: anxious  Lab Results  Recent Labs  01/09/17 0345 01/10/17 0408  WBC 9.4 8.0  HGB 9.5* 9.6*  HCT 28.0* 27.8*  NA 131*  132*  K 4.3 4.3  CL 96* 96*  CO2 32 34*  BUN 22* 20  CREATININE 0.94 0.85    Microbiology: Results for orders placed or performed during the hospital encounter of 12/26/16  Culture, blood (routine x 2)     Status: Abnormal   Collection Time: 12/27/16 12:40 AM  Result Value Ref Range Status   Specimen Description BLOOD RIGHT HAND  Final   Special Requests   Final    BOTTLES DRAWN AEROBIC AND ANAEROBIC AERO5ML,ANAERO4ML   Culture  Setup Time   Final    GRAM POSITIVE RODS ANAEROBIC BOTTLE ONLY CRITICAL VALUE NOTED.  VALUE IS CONSISTENT WITH PREVIOUSLY REPORTED AND CALLED VALUE.    Culture  (A)  Final    ANAEROBIC GRAM POSITIVE RODS UNABLE TO FURTHER IDENTIFY. CORRECTED RESULTS CALLED TO: A ROBINSON,RN AT 1610 01/03/17 BY L BENFIELD PREVIOUSLY REPORTED AS DIPTHEROIDS Performed at [facility], [address] [place], [place] [address]    Report Status 01/03/2017 FINAL  Final  Culture, blood (routine x 2)     Status: Abnormal   Collection Time: 12/27/16 12:40 AM  Result Value Ref Range Status   Specimen Description BLOOD LEFT HAND  Final   Special Requests   Final    BOTTLES DRAWN AEROBIC AND ANAEROBIC AERO3ML,ANAERO10ML   Culture  Setup Time   Final    GRAM POSITIVE RODS ANAEROBIC BOTTLE ONLY CRITICAL RESULT CALLED TO, READ BACK BY AND VERIFIED WITH: HANK ZOMPA 12/29/16 0900 SGD    Culture (A)  Final    ANAEROBIC GRAM POSITIVE RODS UNABLE TO FURTHER IDENTIFY. Performed at [facility], [address] [place], [place] [address]    Report Status 01/02/2017 FINAL  Final  Blood Culture ID Panel (Reflexed)     Status: None   Collection Time: 12/27/16 12:40 AM  Result Value Ref Range Status   Enterococcus species NOT DETECTED NOT DETECTED Final   Listeria monocytogenes NOT DETECTED NOT DETECTED Final   Staphylococcus species NOT DETECTED NOT DETECTED Final   Staphylococcus aureus NOT DETECTED NOT DETECTED Final   Streptococcus species NOT DETECTED NOT DETECTED Final   Streptococcus agalactiae NOT DETECTED NOT DETECTED Final   Streptococcus pneumoniae NOT DETECTED NOT DETECTED Final   Streptococcus pyogenes NOT DETECTED NOT DETECTED Final   Acinetobacter baumannii NOT DETECTED NOT DETECTED Final   Enterobacteriaceae species NOT DETECTED NOT DETECTED Final   Enterobacter cloacae complex NOT DETECTED NOT DETECTED Final   Escherichia coli NOT DETECTED NOT DETECTED Final   Klebsiella oxytoca NOT DETECTED NOT DETECTED Final   Klebsiella pneumoniae NOT DETECTED NOT DETECTED Final   Proteus species NOT DETECTED NOT DETECTED Final   Serratia marcescens NOT  DETECTED NOT DETECTED Final   Haemophilus influenzae NOT DETECTED NOT DETECTED Final   Neisseria meningitidis NOT DETECTED NOT DETECTED Final   Pseudomonas aeruginosa NOT DETECTED NOT DETECTED Final   Candida albicans NOT DETECTED NOT DETECTED Final   Candida glabrata NOT DETECTED NOT DETECTED Final   Candida krusei NOT DETECTED NOT DETECTED Final   Candida parapsilosis NOT DETECTED NOT DETECTED Final   Candida tropicalis NOT DETECTED NOT DETECTED Final  MRSA PCR Screening     Status: None   Collection Time: 12/27/16  6:30 AM  Result Value Ref Range Status   MRSA by PCR NEGATIVE NEGATIVE Final    Comment:        The GeneXpert MRSA Assay (FDA approved for NASAL specimens only), is one component of a comprehensive  MRSA colonization surveillance program. It is not intended to diagnose MRSA infection nor to guide or monitor treatment for MRSA infections.   Urine culture     Status: None   Collection Time: 12/27/16 11:32 AM  Result Value Ref Range Status   Specimen Description URINE, CLEAN CATCH  Final   Special Requests NONE  Final   Culture   Final    NO GROWTH Performed at [facility], [address] [place], [place] [address]    Report Status 12/28/2016 FINAL  Final  Aerobic/Anaerobic Culture (surgical/deep wound)     Status: None   Collection Time: 01/02/17 10:23 AM  Result Value Ref Range Status   Specimen Description DECUBITIS  Final   Special Requests NONE  Final   Gram Stain   Final    MODERATE WBC PRESENT, PREDOMINANTLY PMN FEW GRAM POSITIVE COCCI IN PAIRS FEW GRAM POSITIVE RODS RARE GRAM NEGATIVE RODS    Culture   Final    MODERATE NORMAL SKIN FLORA FEW BACTEROIDES VULGATUS BETA LACTAMASE POSITIVE Performed at [facility], [address] [place], [place] [address]    Report Status 01/07/2017 FINAL  Final  C difficile quick scan w PCR reflex     Status: None   Collection Time: 01/02/17 11:24 AM  Result Value Ref Range Status   C Diff antigen  NEGATIVE NEGATIVE Final   C Diff toxin NEGATIVE NEGATIVE Final   C Diff interpretation No C. difficile detected.  Final  CULTURE, BLOOD (ROUTINE X 2) w Reflex to ID Panel     Status: None   Collection Time: 01/02/17  3:26 PM  Result Value Ref Range Status   Specimen Description BLOOD RIGHT HAND  Final   Special Requests   Final    BOTTLES DRAWN AEROBIC AND ANAEROBIC ANA14ML AER11ML   Culture NO GROWTH 5 DAYS  Final   Report Status 01/07/2017 FINAL  Final  CULTURE, BLOOD (ROUTINE X 2) w Reflex to ID Panel     Status: None   Collection Time: 01/02/17  3:27 PM  Result Value Ref Range Status   Specimen Description BLOOD LEFT ASSIST CONTROL  Final   Special Requests BOTTLES DRAWN AEROBIC AND ANAEROBIC AER9ML ANA8ML  Final   Culture NO GROWTH 5 DAYS  Final   Report Status 01/07/2017 FINAL  Final  Body fluid culture     Status: None   Collection Time: 01/03/17  2:25 PM  Result Value Ref Range Status   Specimen Description PERITONEAL  Final   Special Requests NONE  Final   Gram Stain   Final    RARE WBC PRESENT,BOTH PMN AND MONONUCLEAR NO ORGANISMS SEEN    Culture   Final    NO GROWTH 3 DAYS Performed at [facility], [address] [place], [place] [address]    Report Status 01/06/2017 FINAL  Final      Studies/Results: No results found.  Assessment/Plan: [name] is a 56 y.o. male admitted with bleeding per rectum and bleeding sacral wound as well as fever, leukocytosis and bcx + anaerobic gram positive rods in 2/2 . Has been seen by surgery and wound care. CT shows inflammation down to coccyx and lower sacrum.  The wound seems to have worsened since last seen by surgery and wound care and now has odor, drainage and extensive necrotic tissue. Clinically stable however. Has advanced cirrhosis with ascites.  Dr Tonita Congwoodham did bedside debridement 1/27 and wound looks better but still  very impressive.  PICC placed- cx with skin flora  Recommendations Will need IV  zosyn for minimal 6 week course - see abx order sheet 1/23 Best served at [facility] - will need wound care and possible further surgical debridement  Thank you very much for the consult. Will follow with you.  Mansour Balboa P   01/10/2017, 2:21 PM

## 2017-01-10 NOTE — Progress Notes (Signed)
Patient ID: [name], male   DOB: May 06, 1961, 56 y.o.   MRN: [id number]   [employer] PROGRESS NOTE  DAEJON LICH WUJ:[id number] DOB: [birth year] DOA: 12/26/2016 PCP: Corky Downs, MD  HPI/Subjective: [place] of constipation.  Objective: Vitals:   01/10/17 0740 01/10/17 1204  BP: 108/71 116/84  Pulse: 66 65  Resp: 18   Temp: 97.4 F (36.3 C) 97.7 F (36.5 C)    Filed Weights   01/08/17 0335 01/09/17 0500 01/10/17 0500  Weight: (!) 302 lb (137 kg) 278 lb (126.1 kg) 279 lb (126.6 kg)    ROS: Review of Systems  Unable to perform ROS: Other  Sleeping  Exam: Physical Exam  Constitutional: He is oriented to person, place, and time. He is sleeping.  HENT:  Nose: No mucosal edema.  Mouth/Throat: No oropharyngeal exudate or posterior oropharyngeal edema.  Eyes: Conjunctivae, EOM and lids are normal. Pupils are equal, round, and reactive to light.  Neck: No JVD present. Carotid bruit is not present. No edema present. No thyroid mass and no thyromegaly present.  Cardiovascular: S1 normal and S2 normal.  An irregularly irregular rhythm present. Exam reveals no gallop.   No murmur heard. Pulses:      Dorsalis pedis pulses are 2+ on the right side, and 2+ on the left side.  Respiratory: No respiratory distress. He has decreased breath sounds in the right lower field. He has no wheezes. He has no rhonchi. He has no rales.  GI: Soft. Bowel sounds are normal. He exhibits fluid wave. He exhibits no distension. There is no tenderness.  Genitourinary:  Genitourinary Comments: 4+ scrotal and penis swelling  Musculoskeletal:       Right ankle: He exhibits swelling.       Left ankle: He exhibits swelling.  Lymphadenopathy:    He has no cervical adenopathy.  Neurological: He is oriented to person, place, and time. No cranial nerve deficit.  Skin: Skin is warm. Nails show no clubbing.  Large sacral decubitus  With packing with some necrotic material surrounding.  Marland Kitchen   Psychiatric: He has a normal mood and affect.  Decubitus ulcer exhibits no bleeding signs. Only serous drainage    Data Reviewed: Basic Metabolic Panel:  Recent Labs Lab 01/05/17 0424 01/07/17 0356 01/09/17 0345 01/10/17 0408  NA 132* 132* 131* 132*  K 3.9 3.6 4.3 4.3  CL 97* 97* 96* 96*  CO2 31 32 32 34*  GLUCOSE 127* 141* 128* 103*  BUN 25* 23* 22* 20  CREATININE 0.93 0.99 0.94 0.85  CALCIUM 8.0* 7.9* 8.0* 8.0*  MG 1.7 1.8  --   --    Liver Function Tests: No results for input(s): AST, ALT, ALKPHOS, BILITOT, PROT, ALBUMIN in the last 168 hours.  Recent Labs Lab 01/04/17 0847  AMMONIA <9*   CBC:  Recent Labs Lab 01/05/17 0424 01/07/17 1008 01/09/17 0345 01/10/17 0408  WBC 9.8  --  9.4 8.0  HGB 9.4* 9.7* 9.5* 9.6*  HCT 27.2*  --  28.0* 27.8*  MCV 100.9*  --  102.5* 102.0*  PLT 128*  --  97* 80*    CBG:  Recent Labs Lab 01/03/17 1645 01/03/17 2122 01/04/17 0731 01/04/17 1120 01/04/17 1647  GLUCAP 120* 162* 114* 121* 158*    Recent Results (from the past 240 hour(s))  Aerobic/Anaerobic Culture (surgical/deep wound)     Status: None   Collection Time: 01/02/17 10:23 AM  Result Value Ref Range Status   Specimen Description DECUBITIS  Final  Special Requests NONE  Final   Gram Stain   Final    MODERATE WBC PRESENT, PREDOMINANTLY PMN FEW GRAM POSITIVE COCCI IN PAIRS FEW GRAM POSITIVE RODS RARE GRAM NEGATIVE RODS    Culture   Final    MODERATE NORMAL SKIN FLORA FEW BACTEROIDES VULGATUS BETA LACTAMASE POSITIVE Performed at [facility], [address] [place], [place] [address]    Report Status 01/07/2017 FINAL  Final  C difficile quick scan w PCR reflex     Status: None   Collection Time: 01/02/17 11:24 AM  Result Value Ref Range Status   C Diff antigen NEGATIVE NEGATIVE Final   C Diff toxin NEGATIVE NEGATIVE Final   C Diff interpretation No C. difficile detected.  Final  CULTURE, BLOOD (ROUTINE X 2) w Reflex to ID Panel      Status: None   Collection Time: 01/02/17  3:26 PM  Result Value Ref Range Status   Specimen Description BLOOD RIGHT HAND  Final   Special Requests   Final    BOTTLES DRAWN AEROBIC AND ANAEROBIC ANA14ML AER11ML   Culture NO GROWTH 5 DAYS  Final   Report Status 01/07/2017 FINAL  Final  CULTURE, BLOOD (ROUTINE X 2) w Reflex to ID Panel     Status: None   Collection Time: 01/02/17  3:27 PM  Result Value Ref Range Status   Specimen Description BLOOD LEFT ASSIST CONTROL  Final   Special Requests BOTTLES DRAWN AEROBIC AND ANAEROBIC AER9ML ANA8ML  Final   Culture NO GROWTH 5 DAYS  Final   Report Status 01/07/2017 FINAL  Final  Body fluid culture     Status: None   Collection Time: 01/03/17  2:25 PM  Result Value Ref Range Status   Specimen Description PERITONEAL  Final   Special Requests NONE  Final   Gram Stain   Final    RARE WBC PRESENT,BOTH PMN AND MONONUCLEAR NO ORGANISMS SEEN    Culture   Final    NO GROWTH 3 DAYS Performed at [facility], [address] [place], [place] [address]    Report Status 01/06/2017 FINAL  Final      Scheduled Meds: . ammonium lactate   Topical Daily  . citalopram  10 mg Oral QHS  . diphenhydrAMINE  12.5 mg Intravenous Once  . docusate sodium  200 mg Oral BID  . folic acid  1 mg Oral Daily  . furosemide  20 mg Oral Daily  . midodrine  5 mg Oral TID WC  . multivitamin with minerals  1 tablet Oral Daily  . piperacillin-tazobactam (ZOSYN)  IV  3.375 g Intravenous Q8H  . pneumococcal 23 valent vaccine  0.5 mL Intramuscular Tomorrow-1000  . polyethylene glycol  17 g Oral Daily  . potassium chloride  20 mEq Oral Daily  . protein supplement shake  11 oz Oral BID BM  . senna  1 tablet Oral Daily  . sodium chloride flush  10 mL Intravenous Q12H  . sodium chloride flush  3 mL Intravenous Q12H  . sodium hypochlorite   Irrigation BID  . sotalol  80 mg Oral BID  . spironolactone  12.5 mg Oral Daily    Assessment/Plan:   1. Hemorrhagic shock  due to bleeding sacral ulcer while on anticoagulation.Hemoglobin remained stable 2. Infected decubitus ulcer which is at least a stage III.  Patient seen by Dr. Sampson GoonFitzgerald and he recommended a 6 week course of IV Zosyn. . Seen by surgery and they  debrided the wound awaiting possible approval to LTAC for chronic therapy for this 3. Acute kidney injury on chronic kidney disease stage II continue to monitor with diuresis, renal function stable 4. Hyponatremia and anasarca. Continue Lasix orally daily 5. Chronic atrial fibrillation on sotalol. Holding anticoagulation secondary to bleeding.  6. History of chronic diastolic congestive heart failure. Lasix for anasarca, start spinal lactone  7. Alcoholic liver cirrhosis with ascites. May need paracentesis again 8. Weakness. Physical therapy recommends rehabilitation. Hopefully LTAC placement within the next few days  Code Status:     Code Status Orders        Start     Ordered   12/27/16 8119  Full code  Continuous     12/27/16 0632    Code Status History    Date Active Date Inactive Code Status Order ID Comments User Context   12/15/2016  8:06 PM 12/21/2016  4:29 PM Full Code [id number]  Altamese Dilling, MD ED   04/17/2016  7:26 PM 04/19/2016  5:50 PM Full Code [id number]  Wyatt Haste, MD ED     Family Communication:  Disposition Plan: Hopefully insurance will cover LTAC facility  Consultants:  Psychiatry  Gen. Surgery  Gastroenterology  Antibiotics:  Zosyn  Time spent: 30 minutes  Revella Shelton, [facility]  [employer]

## 2017-01-10 NOTE — Progress Notes (Signed)
Nutrition Follow-up  DOCUMENTATION CODES:   Non-severe (moderate) malnutrition in context of acute illness/injury, Obesity unspecified  INTERVENTION:  Continue Premier Protein po BID, each supplement provides 160 kcal and 30 grams of protein.  Provide Ensure Enlive po once daily, each supplement provides 350 kcal and 20 grams of protein.  Continue multivitamins with minerals daily.  Discussed nutrition plan of care with Dr. Posey Pronto. Patient with inadequate intake and weight loss, now meets criteria for malnutrition. Will order 48 hour calorie count to assess for appropriateness of enteral nutrition (tube feeding). Patient would likely only need nocturnal TF to help meet increased calorie and protein needs for wound healing.  NUTRITION DIAGNOSIS:   Increased nutrient needs related to wound healing as evidenced by estimated needs.  Ongoing.  GOAL:   Patient will meet greater than or equal to 90% of their needs  Not met.   MONITOR:   PO intake, Supplement acceptance, Skin  REASON FOR ASSESSMENT:   Rounds    ASSESSMENT:   56 yr old here with Encephalopathy, Sepsis and EtOH cirrhosis with sacral decub.  Patient confused and poor historian but will follow comands.  He states pain in the rectum area and that he thought he had an accident there.   -Patient is s/p debridement of wound on 1/27 by surgery at bedside, now awaiting LTAC placement.   Spoke with patient at bedside. He reports his appetite is improving, but overall it still remains poor compared to baseline. He denies nausea or abdominal pain. Patient had 2 Premier Protein supplements yesterday, but per chart has not had any to drink today. He reports he can still drink both today. Discussed with patient the weight loss he has had during this admission and that though some of if may be contributed to fluid, he has likely had loss of lean body mass as his intake still remains poor. Patient reports he is unable to increase the  number of meals he has per day or snacks. He is amenable to drinking an Ensure daily in addition to [employer] Protein. This RD attempted to stress the importance of adequate intake for wound healing. Discussed that his intake of calories and protein are the building blocks necessary for wound healing and until he is able to meet these needs, the wounds simply cannot heal. Patient is motivated to heal wounds and reports he will increase intake. RD updated estimated needs based on new weight.   Meal Completion: 15-100% per chart In the past 24 hrs patient has had approximately 1245 kcal (55% minimum estimated kcal needs) and 114 grams protein (88% minimum estimated protein needs).  Medications reviewed and include: Colace, folic acid 1 mg daily, Lasix 20 mg daily, multivitamin with minerals daily, Zosyn, Miralax, potassium chloride 20 mEq daily, senna.  Labs reviewed: Sodium 132, Chloride 96, CO2 34, Glucose 103, Anion gap 2.  Weight trend: 279 lbs on 1/29; Patient has now lost 36 lbs (11.4% body weight) over 2 weeks. Weight loss can likely be contributed to both fluid losses (patient has been on Lasix since 1/15 and per chart edema has been improving) and loss of lean body mass in setting of inadequate intake. Even if only 1-2% of weight loss was loss of true body weight, this would be significant.   Patient now meets criteria for moderate acute malnutrition in setting of 11.4% weight loss over 2 weeks (some of which can be contributed to fluid loss), and intake <75% of estimated energy requirements for > 7 days.  Discussed with RN.   Diet Order:  Diet Carb Modified Fluid consistency: Thin; Room service appropriate? Yes  Skin:  Wound (see comment) (Unstageable full thickness to buttocks)  Last BM:  01/10/2017  Height:   Ht Readings from Last 1 Encounters:  12/27/16 _0  (1.93 m)    Weight:   Wt Readings from Last 1 Encounters:  01/10/17 279 lb (126.6 kg)    Ideal Body Weight:  91.8  kg  BMI:  Body mass index is 33.96 kg/m.  Estimated Nutritional Needs:   Kcal:  2280-2530 (18-20 kcal/kg)  Protein:  130-150 grams (1-1.2 grams/kg)  Fluid:  >2.7L/day   EDUCATION NEEDS:   Education needs no appropriate at this time  Willey Blade, MS, RD, LDN Pager: [phone] After Hours Pager: [phone]

## 2017-01-10 NOTE — Progress Notes (Signed)
OT Cancellation Note  Patient Details Name: [name] MRN: [id number] DOB: October 30, 1961   Cancelled Treatment:    Reason Eval/Treat Not Completed: Fatigue/lethargy limiting ability to participate  Olegario MessierElaine Gwendlyn Hanback, MS, OTR/L 01/10/2017, 11:52 AM

## 2017-01-11 DIAGNOSIS — E440 Moderate protein-calorie malnutrition: Secondary | ICD-10-CM | POA: Insufficient documentation

## 2017-01-11 LAB — GASTROINTESTINAL PANEL BY PCR, STOOL (REPLACES STOOL CULTURE)

## 2017-01-11 LAB — BASIC METABOLIC PANEL
Anion gap: 1 — ABNORMAL LOW (ref 5–15)
BUN: 18 mg/dL (ref 6–20)
CALCIUM: 8.3 mg/dL — AB (ref 8.9–10.3)
CHLORIDE: 97 mmol/L — AB (ref 101–111)
CO2: 34 mmol/L — AB (ref 22–32)
CREATININE: 0.86 mg/dL (ref 0.61–1.24)
GFR calc non Af Amer: >60 mL/min (ref >60)
Glucose, Bld: 101 mg/dL — ABNORMAL HIGH (ref 65–99)
Potassium: 4.4 mmol/L (ref 3.5–5.1)
Sodium: 132 mmol/L — ABNORMAL LOW (ref 135–145)

## 2017-01-11 NOTE — Progress Notes (Signed)
PT Cancellation Note  Patient Details Name: [name] MRN: [id number] DOB: 24-May-1961   Cancelled Treatment:    Reason Eval/Treat Not Completed: Other (comment). Treatment attempted; nursing staff just beginning several care procedures for pt. Request return at a later time. Re attempt treatment tomorrow.    Scot DockHeidi E Jaleesa Cervi, PTA 01/11/2017, 3:33 PM

## 2017-01-11 NOTE — Progress Notes (Addendum)
Patient ID: [name], male   DOB: 31-May-1961, 56 y.o.   MRN: [id number]   [employer] PROGRESS NOTE  [name] WUJ:[id number] DOB: 1961-06-10 DOA: 12/26/2016 PCP: Corky Downs, MD  HPI/Subjective: Patient was constipated yesterday and now is having loose bowel movements with diarrhea  had to have a rectal tube placed  Objective: Vitals:   01/11/17 0805 01/11/17 1537  BP: 115/84 112/84  Pulse: 66 64  Resp:    Temp: 97.5 F (36.4 C)     Filed Weights   01/09/17 0500 01/10/17 0500 01/11/17 0500  Weight: 278 lb (126.1 kg) 279 lb (126.6 kg) 293 lb (132.9 kg)    ROS: Review of Systems  Unable to perform ROS: Other  Sleeping  Exam: Physical Exam  Constitutional: He is oriented to person, place, and time. He is sleeping.  HENT:  Nose: No mucosal edema.  Mouth/Throat: No oropharyngeal exudate or posterior oropharyngeal edema.  Eyes: Conjunctivae, EOM and lids are normal. Pupils are equal, round, and reactive to light.  Neck: No JVD present. Carotid bruit is not present. No edema present. No thyroid mass and no thyromegaly present.  Cardiovascular: S1 normal and S2 normal.  An irregularly irregular rhythm present. Exam reveals no gallop.   No murmur heard. Pulses:      Dorsalis pedis pulses are 2+ on the right side, and 2+ on the left side.  Respiratory: No respiratory distress. He has decreased breath sounds in the right lower field. He has no wheezes. He has no rhonchi. He has no rales.  GI: Soft. Bowel sounds are normal. He exhibits fluid wave. He exhibits no distension. There is no tenderness.  Genitourinary:  Genitourinary Comments: 4+ scrotal and penis swelling  Musculoskeletal:       Right ankle: He exhibits swelling.       Left ankle: He exhibits swelling.  Lymphadenopathy:    He has no cervical adenopathy.  Neurological: He is oriented to person, place, and time. No cranial nerve deficit.  Skin: Skin is warm. Nails show no clubbing.  Large sacral  decubitus  With packing with some necrotic material surrounding.  Marland Kitchen  Psychiatric: He has a normal mood and affect.  Decubitus ulcer exhibits no bleeding signs. Only serous drainage    Data Reviewed: Basic Metabolic Panel:  Recent Labs Lab 01/05/17 0424 01/07/17 0356 01/09/17 0345 01/10/17 0408 01/11/17 0615  NA 132* 132* 131* 132* 132*  K 3.9 3.6 4.3 4.3 4.4  CL 97* 97* 96* 96* 97*  CO2 31 32 32 34* 34*  GLUCOSE 127* 141* 128* 103* 101*  BUN 25* 23* 22* 20 18  CREATININE 0.93 0.99 0.94 0.85 0.86  CALCIUM 8.0* 7.9* 8.0* 8.0* 8.3*  MG 1.7 1.8  --   --   --    Liver Function Tests: No results for input(s): AST, ALT, ALKPHOS, BILITOT, PROT, ALBUMIN in the last 168 hours. No results for input(s): AMMONIA in the last 168 hours. CBC:  Recent Labs Lab 01/05/17 0424 01/07/17 1008 01/09/17 0345 01/10/17 0408  WBC 9.8  --  9.4 8.0  HGB 9.4* 9.7* 9.5* 9.6*  HCT 27.2*  --  28.0* 27.8*  MCV 100.9*  --  102.5* 102.0*  PLT 128*  --  97* 80*    CBG:  Recent Labs Lab 01/04/17 1647  GLUCAP 158*    Recent Results (from the past 240 hour(s))  Aerobic/Anaerobic Culture (surgical/deep wound)     Status: None   Collection Time: 01/02/17 10:23 AM  Result Value Ref Range Status   Specimen Description DECUBITIS  Final   Special Requests NONE  Final   Gram Stain   Final    MODERATE WBC PRESENT, PREDOMINANTLY PMN FEW GRAM POSITIVE COCCI IN PAIRS FEW GRAM POSITIVE RODS RARE GRAM NEGATIVE RODS    Culture   Final    MODERATE NORMAL SKIN FLORA FEW BACTEROIDES VULGATUS BETA LACTAMASE POSITIVE Performed at [facility], [address] [place], [place] [address]    Report Status 01/07/2017 FINAL  Final  C difficile quick scan w PCR reflex     Status: None   Collection Time: 01/02/17 11:24 AM  Result Value Ref Range Status   C Diff antigen NEGATIVE NEGATIVE Final   C Diff toxin NEGATIVE NEGATIVE Final   C Diff interpretation No C. difficile detected.  Final  CULTURE,  BLOOD (ROUTINE X 2) w Reflex to ID Panel     Status: None   Collection Time: 01/02/17  3:26 PM  Result Value Ref Range Status   Specimen Description BLOOD RIGHT HAND  Final   Special Requests   Final    BOTTLES DRAWN AEROBIC AND ANAEROBIC ANA14ML AER11ML   Culture NO GROWTH 5 DAYS  Final   Report Status 01/07/2017 FINAL  Final  CULTURE, BLOOD (ROUTINE X 2) w Reflex to ID Panel     Status: None   Collection Time: 01/02/17  3:27 PM  Result Value Ref Range Status   Specimen Description BLOOD LEFT ASSIST CONTROL  Final   Special Requests BOTTLES DRAWN AEROBIC AND ANAEROBIC AER9ML ANA8ML  Final   Culture NO GROWTH 5 DAYS  Final   Report Status 01/07/2017 FINAL  Final  Body fluid culture     Status: None   Collection Time: 01/03/17  2:25 PM  Result Value Ref Range Status   Specimen Description PERITONEAL  Final   Special Requests NONE  Final   Gram Stain   Final    RARE WBC PRESENT,BOTH PMN AND MONONUCLEAR NO ORGANISMS SEEN    Culture   Final    NO GROWTH 3 DAYS Performed at [facility], [address] [place], [place] [address]    Report Status 01/06/2017 FINAL  Final  Gastrointestinal Panel by PCR , Stool     Status: None   Collection Time: 01/11/17 10:34 AM  Result Value Ref Range Status   Campylobacter species NOT DETECTED NOT DETECTED Final   Plesimonas shigelloides NOT DETECTED NOT DETECTED Final   Salmonella species NOT DETECTED NOT DETECTED Final   Yersinia enterocolitica NOT DETECTED NOT DETECTED Final   Vibrio species NOT DETECTED NOT DETECTED Final   Vibrio cholerae NOT DETECTED NOT DETECTED Final   Enteroaggregative E coli (EAEC) NOT DETECTED NOT DETECTED Final   Enteropathogenic E coli (EPEC) NOT DETECTED NOT DETECTED Final   Enterotoxigenic E coli (ETEC) NOT DETECTED NOT DETECTED Final   Shiga like toxin producing E coli (STEC) NOT DETECTED NOT DETECTED Final   Shigella/Enteroinvasive E coli (EIEC) NOT DETECTED NOT DETECTED Final   Cryptosporidium NOT  DETECTED NOT DETECTED Final   Cyclospora cayetanensis NOT DETECTED NOT DETECTED Final   Entamoeba histolytica NOT DETECTED NOT DETECTED Final   Giardia lamblia NOT DETECTED NOT DETECTED Final   Adenovirus F40/41 NOT DETECTED NOT DETECTED Final   Astrovirus NOT DETECTED NOT DETECTED Final   Norovirus GI/GII NOT DETECTED NOT DETECTED Final   Rotavirus A NOT DETECTED NOT DETECTED Final   Sapovirus (I, II, IV, and V)  NOT DETECTED NOT DETECTED Final      Scheduled Meds: . ammonium lactate   Topical Daily  . citalopram  10 mg Oral QHS  . diphenhydrAMINE  12.5 mg Intravenous Once  . feeding supplement (ENSURE ENLIVE)  237 mL Oral Q24H  . folic acid  1 mg Oral Daily  . furosemide  20 mg Oral Daily  . midodrine  5 mg Oral TID WC  . multivitamin with minerals  1 tablet Oral Daily  . piperacillin-tazobactam (ZOSYN)  IV  3.375 g Intravenous Q8H  . pneumococcal 23 valent vaccine  0.5 mL Intramuscular Tomorrow-1000  . potassium chloride  20 mEq Oral Daily  . protein supplement shake  11 oz Oral BID BM  . sodium chloride flush  10 mL Intravenous Q12H  . sodium chloride flush  3 mL Intravenous Q12H  . sodium hypochlorite   Irrigation BID  . sotalol  80 mg Oral BID  . spironolactone  12.5 mg Oral Daily    Assessment/Plan:   1. Hemorrhagic shock due to bleeding sacral ulcer Hemoglobin stable 2. Infected decubitus ulcer stage III.  Patient seen by Dr. Sampson Goon and he recommended a 6 week course of IV Zosyn. . Seen by surgery had debrided the wound awaiting possible approval to LTAC for chronic therapy for this 3. Acute kidney injury on chronic kidney disease stage III continue to monitor with diuresis, renal function stable 4. Hyponatremia and anasarca. Continue Lasix orally daily 5. Chronic atrial fibrillation on sotalol. Holding anticoagulation secondary to bleeding.  6. History of chronic diastolic congestive heart failure. Lasix for anasarca, on spiranoloactone 7. Alcoholic liver  cirrhosis with ascites. May need paracentesis again  8. Weakness. Physical therapy recommends rehabilitation. Hopefully LTAC placement within the next few days 9.   Diarrhea after treating patient for constipation I will stop colace, miralex and sennacot Code Status:     Code Status Orders        Start     Ordered   12/27/16 0632  Full code  Continuous     12/27/16 0632    Code Status History    Date Active Date Inactive Code Status Order ID Comments User Context   12/15/2016  8:06 PM 12/21/2016  4:29 PM Full Code [id number]  Altamese Dilling, MD ED   04/17/2016  7:26 PM 04/19/2016  5:50 PM Full Code [id number]  Wyatt Haste, MD ED     Family Communication:  Disposition Plan: Hopefully insurance will cover LTAC facility  Consultants:  Psychiatry  Gen. Surgery  Gastroenterology  Antibiotics:  Zosyn  Time spent: 30 minutes  Arlee Santosuosso, [facility]  [employer]

## 2017-01-11 NOTE — Care Management (Signed)
Still waiting on insurance approval [facility]. IV Zosyn continues. Needs 6 week course of IV Zosyn. Antibiotic started 01/04/17. Wound Care continues. Multiple loose watery stools. Rectal tube ordered.                                                                                           Gwenette GreetBrenda S Alcee Sipos RN MSN CCM Care Management

## 2017-01-11 NOTE — Progress Notes (Signed)
Pt has had multiple loose, watery BMs. MD notified. Rectal tube and GI panel ordered. Pressure ulcer dressing changed.

## 2017-01-11 NOTE — Progress Notes (Signed)
Calorie Count Note  48 hour calorie count ordered. Day 1 results below:  Meal ticket receipts were not collected and kept in room. Patient reports he did not receive any oral nutrition supplements today or yesterday. He is still amenable to drinking them.   Diet: Carbohydrate Modified Supplements:  -Premier Protein po BID, each supplement provides 160 kcal and 30 grams of protein -Ensure Enlive po once daily, each supplement provides 350 kcal and 20 grams of protein  Dinner 1/29: N/A (patient ate 0% of meal) Breakfast 1/30: 200 kcal, 10 grams of protein (corn flakes in 2% milk) Lunch 1/30: 126 kcal, 2 grams of protein (whipped potatoes with margarine) Supplements: N/A (patient did not drink any)  Total intake: 326 kcal (14% of minimum estimated needs)  12 grams of protein (9% of minimum estimated needs)  Estimated Nutritional Needs:  Kcal:  2280-2530 (18-20 kcal/kg) Protein:  130-150 grams (1-1.2 grams/kg) Fluid:  >2.7L/day   Nutrition Dx: Increased nutrient needs related to wound healing as evidenced by estimated needs.  Goal: Patient will meet greater than or equal to 90% of their needs  Intervention:  -Continue Premier Protein po BID, each supplement provides 160 kcal and 30 grams of protein. -Continue Ensure Enlive po once daily, each supplement provides 350 kcal and 20 grams of protein. -Continue multivitamins with minerals daily. -Continue calorie count. -Discussed nutrition plan of care with Dr. Allena KatzPatel on 1/29. Patient with inadequate intake and weight loss, now meets criteria for malnutrition. Will continue 48 hour calorie count to assess for appropriateness of enteral nutrition (tube feeding). Patient would likely only need nocturnal TF to help meet increased calorie and protein needs for wound healing.  Helane RimaLeanne Dyon Rotert, MS, RD, LDN Pager: [phone] After Hours Pager: [phone]

## 2017-01-12 MED ORDER — ENSURE ENLIVE PO LIQD
237.0000 mL | Freq: Two times a day (BID) | ORAL | Status: DC
  Administered 2017-01-12 – 2017-01-14 (×4): 237 mL via ORAL

## 2017-01-12 MED ORDER — ENSURE ENLIVE PO LIQD: 237.0000 mL | Freq: Two times a day (BID) | ORAL | Status: DC

## 2017-01-12 NOTE — NC FL2 (Signed)
[place] MEDICAID FL2 LEVEL OF CARE SCREENING TOOL     IDENTIFICATION  Patient Name: [name] Birthdate: 12-18-1960 Sex: male Admission Date (Current Location): 12/26/2016  [facility] and [place] Number:  Chiropodist and Address:  [facility], [address] [place], [place] [address]      Provider Number: [id number]  Attending Physician Name and Address:  Houston Siren, MD  Relative Name and Phone Number:       Current Level of Care: Hospital Recommended Level of Care: Skilled Nursing Facility Prior Approval Number:    Date Approved/Denied:   PASRR Number:    Discharge Plan: SNF    Current Diagnoses: Patient Active Problem List   Diagnosis Date Noted  . Malnutrition of moderate degree 01/11/2017  . Decubitus ulcer of sacral region, stage 3 (HCC)   . Adjustment disorder with mixed anxiety and depressed mood 12/30/2016  . Sepsis (HCC) 12/27/2016  . Alcoholic cirrhosis of liver with ascites (HCC)   . Pressure injury of skin 12/16/2016  . Acute renal failure (ARF) (HCC) 12/15/2016  . Anemia 12/15/2016  . 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 Hyponatremia 04/17/2016    Orientation RESPIRATION BLADDER Height & Weight     Self, Time, Situation, Place  Normal Incontinent Weight: 266 lb 3.2 oz (120.7 kg) Height:  6\' 4"  (193 cm)  BEHAVIORAL SYMPTOMS/MOOD NEUROLOGICAL BOWEL NUTRITION STATUS   (none)  (none) Incontinent Diet (carb modified)  AMBULATORY STATUS COMMUNICATION OF NEEDS Skin   Total Care Verbally PU Stage and Appropriate Care                       Personal Care Assistance Level of Assistance  Bathing, Dressing, Feeding Bathing Assistance: Limited assistance Feeding assistance: Limited assistance Dressing Assistance: Limited assistance      Functional Limitations Info  Sight Sight Info: Impaired        SPECIAL CARE FACTORS FREQUENCY  PT (By licensed PT)                    Contractures Contractures Info: Not present    Additional Factors Info  Code Status, Allergies Code Status Info: full Allergies Info: clindamycin, lincomycin           Current Medications (01/12/2017):  This is the current hospital active medication list Current Facility-Administered Medications  Medication Dose Route Frequency Provider Last Rate Last Dose  . acetaminophen (TYLENOL) tablet 650 mg  650 mg Oral Q6H PRN Arnaldo Natal, MD       Or  . acetaminophen (TYLENOL) suppository 650 mg  650 mg Rectal Q6H PRN Arnaldo Natal, MD      . ammonium lactate (LAC-HYDRIN) 12 % lotion   Topical Daily Ricarda Frame, MD   Stopped at 01/10/17 1057  . citalopram (CELEXA) tablet 10 mg  10 mg Oral QHS Alford Highland, MD   10 mg at 01/09/17 2122  . diphenhydrAMINE (BENADRYL) injection 12.5 mg  12.5 mg Intravenous Once Arnaldo Natal, MD      . feeding supplement (ENSURE ENLIVE) (ENSURE ENLIVE) liquid 237 mL  237 mL Oral Q24H Auburn Bilberry, MD   237 mL at 01/12/17 0846  . folic acid (FOLVITE) tablet 1 mg  1 mg Oral Daily Arnaldo Natal, MD   1 mg  at 01/12/17 1134  . furosemide (LASIX) tablet 20 mg  20 mg Oral Daily Katharina Caperima Vaickute, MD   20 mg at 01/12/17 1134  . midodrine (PROAMATINE) tablet 5 mg  5 mg Oral TID WC Auburn BilberryShreyang Patel, MD   5 mg at 01/12/17 1134  . multivitamin with minerals tablet 1 tablet  1 tablet Oral Daily Alford Highlandichard Wieting, MD   1 tablet at 01/12/17 1134  . ondansetron (ZOFRAN) tablet 4 mg  4 mg Oral Q6H PRN Arnaldo NatalMichael S Diamond, MD       Or  . ondansetron [facility](ZOFRAN) injection 4 mg  4 mg Intravenous Q6H PRN Arnaldo NatalMichael S Diamond, MD   4 mg at 01/11/17 0325  . oxyCODONE (Oxy IR/ROXICODONE) immediate release tablet 5 mg  5 mg Oral Q6H PRN Rolm BaptiseHolly N Gilliam, RPH   5 mg at 12/31/16 1254  . piperacillin-tazobactam (ZOSYN) IVPB 3.375 g   3.375 g Intravenous Q8H Mick Sellavid P Fitzgerald, MD   3.375 g at 01/12/17 0543  . pneumococcal 23 valent vaccine (PNU-IMMUNE) injection 0.5 mL  0.5 mL Intramuscular Tomorrow-1000 Shane CrutchPradeep Ramachandran, MD      . potassium chloride SA (K-DUR,KLOR-CON) CR tablet 20 mEq  20 mEq Oral Daily Shaune PollackQing Chen, MD   20 mEq at 01/12/17 1134  . protein supplement (PREMIER PROTEIN) liquid  11 oz Oral BID BM Auburn BilberryShreyang Patel, MD   11 oz at 01/12/17 1141  . sodium chloride flush (NS) 0.9 % injection 10 mL  10 mL Intravenous Q12H Katharina Caperima Vaickute, MD   10 mL at 01/12/17 1135  . sodium chloride flush (NS) 0.9 % injection 3 mL  3 mL Intravenous Q12H Arnaldo NatalMichael S Diamond, MD   3 mL at 01/12/17 1135  . sodium hypochlorite (DAKIN'S 1/4 STRENGTH) topical solution   Irrigation BID Ricarda Frameharles Woodham, MD      . sotalol (BETAPACE) tablet 80 mg  80 mg Oral BID Arnaldo NatalMichael S Diamond, MD   80 mg at 01/12/17 1134  . spironolactone (ALDACTONE) tablet 12.5 mg  12.5 mg Oral Daily Auburn BilberryShreyang Patel, MD   12.5 mg at 01/12/17 1133     Discharge Medications: Please see discharge summary for a list of discharge medications.  Relevant Imaging Results:  Relevant Lab Results:   Additional Information ss: [id number]  York SpanielMonica Marl Seago, LCSW

## 2017-01-12 NOTE — Care Management (Signed)
Notified by Cicero DuckErika from [facility] that [employer] has denied transfer to [facility].  Dr. Cherlynn KaiserSainani to completed Peer to Peer.

## 2017-01-12 NOTE — Progress Notes (Signed)
Physical Therapy Treatment Patient Details Name: [name] RouteMichael J [name] MRN: [id number] DOB: 1961-07-29 Today's Date: 01/12/2017    History of Present Illness Pt. is a a 56 y.o. male who [facility] with rectal bleeding, and septic shock secondary to a necrotic sacral ulcer.    PT Comments    Patient status post bedside debridement of sacral ulcer on previous date; now with rectal tube for stool containment.  Remains globally weak and deconditioned due to acute illness (strength grossly 3- to 4-/5 throughout all extremities); however, marked improvement in alertness and overall cognitive status/orientation.  Completes all functional mobility (bed mobility, sit/stand and side stepping edge of bed) with RW, mod assist +2 for safety. Unable to tolerate additional distance/activity due to fatigue. POC extended for two additional weeks; goals updated to reflect current status.   Follow Up Recommendations  SNF     Equipment Recommendations       Recommendations for Other Services       Precautions / Restrictions Precautions Precautions: Fall Precaution Comments: rectal tube, sacral ulcer Restrictions Weight Bearing Restrictions: No    Mobility  Bed Mobility Overal bed mobility: Needs Assistance Bed Mobility: Rolling;Supine to Sit;Sit to Supine Rolling: Mod assist;Min assist   Supine to sit: Mod assist;+2 for physical assistance Sit to supine: Mod assist;Max assist;+2 for physical assistance      Transfers Overall transfer level: Needs assistance Equipment used: Rolling walker (2 wheeled) Transfers: Sit to/from Stand Sit to Stand: Min assist;Mod assist;+2 physical assistance         General transfer comment: heavy use of momentum and bilat UEs to assist with lift off from elevated bed surface; poor LE power and muscular endurance  Ambulation/Gait Ambulation/Gait assistance: Min assist;Mod assist;+2 physical assistance Ambulation Distance (Feet): 3 Feet Assistive  device: Rolling walker (2 wheeled)       General Gait Details: lateral steps edge of bed, forward trunk flexion with heavy WBing bilat UEs; unable/unwilling to trial additional distance   Stairs            Wheelchair Mobility    Modified Rankin (Stroke Patients Only)       Balance Overall balance assessment: Needs assistance Sitting-balance support: No upper extremity supported;Feet supported Sitting balance-Leahy Scale: Fair     Standing balance support: Bilateral upper extremity supported Standing balance-Leahy Scale: Fair                      Cognition Arousal/Alertness: Awake/alert Behavior During Therapy: WFL for tasks assessed/performed Overall Cognitive Status: Within Functional Limits for tasks assessed                      Exercises Other Exercises Other Exercises: Rolling bilat, min/mod assist for full truncal/pelvic rotation, heavy use of bedrails to assist.  Min/mod assist from therapist for hooklying position to maximize ability of LEs to assist with transitional movements.  Dep assist of second person for hygiene (stool leaking from around rectal tube) Other Exercises: Sit/stand x2 with RW, min/mod assist +2-poor LE power and endurance; heavy use of UEs to assist with lift off from elevated bed surface Other Exercises: Generalized strength re-assessment: bilat UEs at least 4-/5, bilat LEs at least 3-/5; globally deconditioned.    General Comments        Pertinent Vitals/Pain Pain Assessment: No/denies pain    Home Living  Prior Function            PT Goals (current goals can now be found in the care plan section) Acute Rehab PT Goals Patient Stated Goal: return home PT Goal Formulation: With patient Time For Goal Achievement: 01/26/17 Potential to Achieve Goals: Fair Progress towards PT goals: Progressing toward goals    Frequency    Min 2X/week      PT Plan Current plan remains  appropriate    Co-evaluation             End of Session Equipment Utilized During Treatment: Gait belt Activity Tolerance: Patient limited by fatigue Patient left: in bed;with call bell/phone within reach     Time: 1416-1443 PT Time Calculation (min) (ACUTE ONLY): 27 min  Charges:  $Therapeutic Exercise: 8-22 mins $Therapeutic Activity: 8-22 mins                    G Codes:      Adolphus Hanf H. Manson Passey, PT, DPT, NCS 01/12/17, 3:14 PM [phone]

## 2017-01-12 NOTE — Care Management (Signed)
MD peer to peer completed for LTACH denial.  Transfer still denied.  SNF bed search initiated by CSW

## 2017-01-12 NOTE — Progress Notes (Signed)
[employer] [facility] [place] at [facility]   PATIENT NAME: Fernande BrasMichael [place]    MR#:  [id number]  DATE OF BIRTH:  06/29/1961  SUBJECTIVE:   Pt. Here due to Sepsis from infected stage 3-4 decubitus ulcer. Currently on long-term IV antibiotics. No other acute events overnight.  REVIEW OF SYSTEMS:    Review of Systems  Constitutional: Negative for chills and fever.  HENT: Negative for congestion and tinnitus.   Eyes: Negative for blurred vision and double vision.  Respiratory: Negative for cough, shortness of breath and wheezing.   Cardiovascular: Negative for chest pain, orthopnea and PND.  Gastrointestinal: Negative for abdominal pain, diarrhea, nausea and vomiting.  Genitourinary: Negative for dysuria and hematuria.  Neurological: Negative for dizziness, sensory change and focal weakness.  All other systems reviewed and are negative.   Nutrition: Carb modified Tolerating Diet: Yes Tolerating PT: Eval noted.    DRUG ALLERGIES:   Allergies  Allergen Reactions  . Clindamycin/Lincomycin Diarrhea    VITALS:  Blood pressure 110/82, pulse 70, temperature 98.2 F (36.8 C), temperature source Oral, resp. rate 16, height 6\' 4"  (1.93 m), weight 120.7 kg (266 lb 3.2 oz), SpO2 95 %.  PHYSICAL EXAMINATION:   Physical Exam  GENERAL:  56 y.o.-year-old obese patient lying in bed in no acute distress.  EYES: Pupils equal, round, reactive to light and accommodation. No scleral icterus. Extraocular muscles intact.  HEENT: Head atraumatic, normocephalic. Oropharynx and nasopharynx clear.  NECK:  Supple, no jugular venous distention. No thyroid enlargement, no tenderness.  LUNGS: Normal breath sounds bilaterally, no wheezing, rales, rhonchi. No use of accessory muscles of respiration.  CARDIOVASCULAR: S1, S2 normal. No murmurs, rubs, or gallops.  ABDOMEN: Soft, nontender, nondistended. Bowel sounds present. No organomegaly or mass.  EXTREMITIES: No cyanosis, clubbing, +1 edema  b/l, signs of chronic venous stasis b/l NEUROLOGIC: Cranial nerves II through XII are intact. No focal Motor or sensory deficits b/l.  Globally weak PSYCHIATRIC: The patient is alert and oriented x 3.  SKIN: No obvious rash, lesion, Stage 3 Sacral Decub ulcer.    LABORATORY PANEL:   CBC  Recent Labs Lab 01/10/17 0408  WBC 8.0  HGB 9.6*  HCT 27.8*  PLT 80*   ------------------------------------------------------------------------------------------------------------------  Chemistries   Recent Labs Lab 01/07/17 0356  01/11/17 0615  NA 132*  < > 132*  K 3.6  < > 4.4  CL 97*  < > 97*  CO2 32  < > 34*  GLUCOSE 141*  < > 101*  BUN 23*  < > 18  CREATININE 0.99  < > 0.86  CALCIUM 7.9*  < > 8.3*  MG 1.8  --   --   < > = values in this interval not displayed. ------------------------------------------------------------------------------------------------------------------  Cardiac Enzymes No results for input(s): TROPONINI in the last 168 hours. ------------------------------------------------------------------------------------------------------------------  RADIOLOGY:  No results found.   ASSESSMENT AND PLAN:   56 year old male with past medical history of hypertension, CHF, chronic atrial fibrillation, obesity, COPD, alcoholic liver cirrhosis, peripheral vascular disease Who presented to the hospital due to rectal bleeding and shock secondary to bleeding sacral decubitus ulcer.  1. Shock-this was hypovolemic/hemorrhagic shock secondary to bleeding sacral decubitus ulcer. -Patient has been transfused and currently hemoglobin is stable. No evidence of acute bleeding presently. - hemodynamics improved.  2. Infected stage 3-4 decubitus ulcer-patient has been seen by surgery and is status post debridement. -Seen by infectious disease and currently on IV Zosyn and needs a long-term coursework to 6 weeks. -Continue  local wound care and IV antibiotics.  3. Acute on chronic  renal failure-secondary to ATN and hypotension. -Renal function back to baseline now.  4. Chronic A. Fib - rate controlled.  - cont. Sotalol  5. Hx of Diastolic CHF - clinically not in CHF.  - cont. Lasix, Aldactone.   6. Depression - cont. Celexa.   7. 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  - cont. Midodrine. BP stable.   Pt. Was awaiting LTACH approval but insurance has denied it. Discussed w/ CM/Social Work.    All the records are reviewed and case discussed with Care Management/Social Worker. Management plans discussed with the patient, family and they are in agreement.  CODE STATUS: Full  DVT Prophylaxis: Ted's & SCD's.   TOTAL TIME TAKING CARE OF THIS PATIENT: 30 minutes.   POSSIBLE D/C IN 2-3 DAYS, DEPENDING ON CLINICAL CONDITION.   Houston Siren M.D on 01/12/2017 at 2:21 PM  Between 7am to 6pm - Pager - [phone]  After 6pm go to [email or web address] - Social research officer, government  [employer]  Hospitalists  Office  [phone]  CC: Primary care physician; Corky Downs, MD

## 2017-01-12 NOTE — Progress Notes (Signed)
Calorie Count Note  48 hour calorie count ordered. Day 2 results below:  Diet: Carbohydrate Modified Supplements:  -Premier Protein po BID, each supplement provides 160 kcal and 30 grams of protein -Ensure Enlive po once daily, each supplement provides 350 kcal and 20 grams of protein  Dinner 1/30: 221 kcal, 8 grams of protein Breakfast: 354 kcal, 22 grams of protein Lunch: 461 kcal, 19 grams of protein Supplements: 1020 kcal, 100 grams of protein (2 Premier Protein, 2 Ensure Enlive - one today and one last night)  Total intake: 2056 kcal (90% of minimum estimated needs)  149 protein (100% of estimated needs)  Estimated Nutritional Needs: Kcal:2280-2530 (18-20 kcal/kg) Protein:130-150 grams (1-1.2 grams/kg) Fluid:>2.7L/day   Nutrition Dx: Increased nutrient needsrelated to wound healingas evidenced by estimated needs.  Goal: Patient will meet greater than or equal to 90% of their needs  Intervention: -Continue Premier Protein po BID, each supplement provides 160 kcal and 30 grams of protein. -Will increase to Ensure Enlive po BID, each supplement provides 350 kcal and 20 grams of protein as by drinking two Ensure within 24 hours in addition to two Premier Protein, patient was able to meet needs. -Continue multivitamins with minerals daily. -Will discontinue calorie count as 48 hours have been completed.  Helane RimaLeanne Reice [place], MS, RD, LDN Pager: [phone] After Hours Pager: [phone]

## 2017-01-13 LAB — CBC
HEMATOCRIT: 30.9 % — AB (ref 40.0–52.0)
HEMOGLOBIN: 10.5 g/dL — AB (ref 13.0–18.0)
MCH: 34.9 pg — ABNORMAL HIGH (ref 26.0–34.0)
MCHC: 33.9 g/dL (ref 32.0–36.0)
MCV: 102.8 fL — AB (ref 80.0–100.0)
Platelets: 79 10*3/uL — ABNORMAL LOW (ref 150–440)
RBC: 3.01 MIL/uL — ABNORMAL LOW (ref 4.40–5.90)
RDW: 18.3 % — AB (ref 11.5–14.5)
WBC: 7.4 10*3/uL (ref 3.8–10.6)

## 2017-01-13 NOTE — Progress Notes (Signed)
Patient wound dressing done to sacral area. Wound cleaned with saline and gauze moistened with Dakins solution and packed with 3 gauze covered with dry gauze and pink foam. wound bed with pink granulating tissue. Rectal tube remains in place and draining small amount of liquid stool. Patient is alert and oriented no acute distress noted.

## 2017-01-13 NOTE — Progress Notes (Signed)
PT Cancellation Note  Patient Details Name: [name] MRN: [id number] DOB: 26-May-1961   Cancelled Treatment:    Reason Eval/Treat Not Completed: Patient declined, no reason specified. Treatment attempted, pt refuses noting he just finished eating and does not feel able to do anything at this time. Attempted further encouragement for participation with session starting light with exercises and progressing to mobility if able; pt pleasantly, but adamantly refuses any and all therapy attempts. Re attempt at a later date.    Scot DockHeidi E Barnes, PTA 01/13/2017, 2:11 PM

## 2017-01-13 NOTE — Clinical Social Work Note (Signed)
CSW has extended offers to patient and his girlfriend and they have chosen [facility]. CSW has fowarded additional information for [facility] to use for prior authorization to HovenDoug, their admissions coordinator.  York SpanielMonica Arianis Bowditch MSW,LCSW [phone]

## 2017-01-13 NOTE — Progress Notes (Signed)
[employer] - [place] at [facility]   PATIENT NAME: [name]    MR#:  [id number]  DATE OF BIRTH:  1961/07/18  SUBJECTIVE:   Pt. Here due to Sepsis from infected stage 3-4 decubitus ulcer. Remains on IV Zosyn.  No other acute events overnight.   REVIEW OF SYSTEMS:    Review of Systems  Constitutional: Negative for chills and fever.  HENT: Negative for congestion and tinnitus.   Eyes: Negative for blurred vision and double vision.  Respiratory: Negative for cough, shortness of breath and wheezing.   Cardiovascular: Negative for chest pain, orthopnea and PND.  Gastrointestinal: Negative for abdominal pain, diarrhea, nausea and vomiting.  Genitourinary: Negative for dysuria and hematuria.  Neurological: Negative for dizziness, sensory change and focal weakness.  All other systems reviewed and are negative.   Nutrition: Carb modified Tolerating Diet: Yes Tolerating PT: Eval noted.    DRUG ALLERGIES:   Allergies  Allergen Reactions  . Clindamycin/Lincomycin Diarrhea    VITALS:  Blood pressure 108/79, pulse 79, temperature 98.2 F (36.8 C), temperature source Oral, resp. rate 18, height 6\' 4"  (1.93 m), weight 120.7 kg (266 lb 3.2 oz), SpO2 97 %.  PHYSICAL EXAMINATION:   Physical Exam  GENERAL:  56 y.o.-year-old obese patient lying in bed in no acute distress.  EYES: Pupils equal, round, reactive to light. No scleral icterus. Extraocular muscles intact.  HEENT: Head atraumatic, normocephalic. Oropharynx and nasopharynx clear.  NECK:  Supple, no jugular venous distention. No thyroid enlargement, no tenderness.  LUNGS: Normal breath sounds bilaterally, no wheezing, rales, rhonchi. No use of accessory muscles of respiration.  CARDIOVASCULAR: S1, S2 normal. No murmurs, rubs, or gallops.  ABDOMEN: Soft, nontender, nondistended. Bowel sounds present. No organomegaly or mass.  EXTREMITIES: No cyanosis, clubbing, +1 edema b/l, signs of chronic venous stasis  b/l NEUROLOGIC: Cranial nerves II through XII are intact. No focal Motor or sensory deficits b/l.  Globally weak PSYCHIATRIC: The patient is alert and oriented x 3.  SKIN: No obvious rash, lesion, Stage 3 Sacral Decub ulcer.    LABORATORY PANEL:   CBC  Recent Labs Lab 01/13/17 0443  WBC 7.4  HGB 10.5*  HCT 30.9*  PLT 79*   ------------------------------------------------------------------------------------------------------------------  Chemistries   Recent Labs Lab 01/07/17 0356  01/11/17 0615  NA 132*  < > 132*  K 3.6  < > 4.4  CL 97*  < > 97*  CO2 32  < > 34*  GLUCOSE 141*  < > 101*  BUN 23*  < > 18  CREATININE 0.99  < > 0.86  CALCIUM 7.9*  < > 8.3*  MG 1.8  --   --   < > = values in this interval not displayed. ------------------------------------------------------------------------------------------------------------------  Cardiac Enzymes No results for input(s): TROPONINI in the last 168 hours. ------------------------------------------------------------------------------------------------------------------  RADIOLOGY:  No results found.   ASSESSMENT AND PLAN:   55 year old male with past medical history of hypertension, CHF, chronic atrial fibrillation, obesity, COPD, alcoholic liver cirrhosis, peripheral vascular disease Who presented to the hospital due to rectal bleeding and shock secondary to bleeding sacral decubitus ulcer.  1. Shock-this was hypovolemic/hemorrhagic shock secondary to bleeding sacral decubitus ulcer. -Patient has been transfused and currently hemoglobin is stable. No evidence of acute bleeding presently. - hemodynamics improved.  2. Infected stage 3-4 decubitus ulcer-patient has been seen by surgery and is status post debridement. -Seen by infectious disease and currently on IV Zosyn and needs a long-term course up to 6 weeks. -Continue  local wound care and IV antibiotics.  3. Acute on chronic renal failure-secondary to ATN and  hypotension. -Renal function back to baseline now.  4. Chronic A. Fib - rate controlled.  - cont. Sotalol  5. Hx of Diastolic CHF - clinically not in CHF.  - cont. Lasix, Aldactone.   6. Depression - cont. Celexa.   7. 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  - cont. Midodrine. BP stable.   Insurance did not approve LTACH placement.  [facility].   All the records are reviewed and case discussed with Care Management/Social Worker. Management plans discussed with the patient, family and they are in agreement.  CODE STATUS: Full  DVT Prophylaxis: Ted's & SCD's.   TOTAL TIME TAKING CARE OF THIS PATIENT: 25 minutes.   POSSIBLE D/C IN 1-2 DAYS, DEPENDING ON CLINICAL CONDITION.   Houston SirenSAINANI,VIVEK J M.D on 01/13/2017 at 2:54 PM  Between 7am to 6pm - Pager - [phone]  After 6pm go to [email or web address] - Social research officer, governmentpassword [employer]AS [facility]  [employer] [place] [facility]  Office  [phone]  CC: Primary care physician; Corky DownsMASOUD,[name], MD

## 2017-01-13 NOTE — Progress Notes (Signed)
Nutrition Follow-up  DOCUMENTATION CODES:   Non-severe (moderate) malnutrition in context of acute illness/injury, Obesity unspecified  INTERVENTION:  Continue Premier Protein po BID, each supplement provides 160 kcal and 30 grams of protein.  Will increase toEnsure Enlive po BID, each supplement provides 350 kcal and 20 grams of protein.  Continue multivitamins with minerals daily.  NUTRITION DIAGNOSIS:   Increased nutrient needs related to wound healing as evidenced by estimated needs.  Ongoing.   GOAL:   Patient will meet greater than or equal to 90% of their needs  Met with protein, close to met with calories.  MONITOR:   PO intake, Supplement acceptance, Skin  REASON FOR ASSESSMENT:   Rounds    ASSESSMENT:   56 yr old here with Encephalopathy, Sepsis and EtOH cirrhosis with sacral decub.  Patient confused and poor historian but will follow comands.  He states pain in the rectum area and that he thought he had an accident there.   Spoke with patient at bedside. His appetite is improving but still poor overall. He reports he is able to continue 2 Ensure Enlive and 2 Premier Protein daily until appetite improves. Patient still having diarrhea.   Meal Completion: 75-100% per chart and patient report. Patient had 2 Ensure Enlive and 2 Premier Protein. In the past 24 hours patient has had approximately 2014 kcal (88% minimum estimated kcal needs) and 148 grams of protein (100% estimated protein needs).   Medications reviewed and include: folic acid 1 mg daily, multivitamin with minerals daily, Lasix 20 mg daily, Zosyn, potassium chloride 20 mEq daily.  Labs reviewed.  Discussed with RN.   Diet Order:  Diet Carb Modified Fluid consistency: Thin; Room service appropriate? Yes  Skin:  Wound (see comment) (Unstageable full thickness to buttocks)  Last BM:  01/13/2017  Height:   Ht Readings from Last 1 Encounters:  12/27/16 6' 4"  (1.93 m)    Weight:   Wt  Readings from Last 1 Encounters:  01/12/17 266 lb 3.2 oz (120.7 kg)    Ideal Body Weight:  91.8 kg  BMI:  Body mass index is 32.4 kg/m.  Estimated Nutritional Needs:   Kcal:  2280-2530 (18-20 kcal/kg)  Protein:  130-150 grams (1-1.2 grams/kg)  Fluid:  >2.7L/day   EDUCATION NEEDS:   Education needs no appropriate at this time  Willey Blade, MS, RD, LDN Pager: [phone] After Hours Pager: [phone]

## 2017-01-14 MED ORDER — SPIRONOLACTONE 25 MG PO TABS: 12.5000 mg | ORAL_TABLET | Freq: Every day | ORAL | Status: AC

## 2017-01-14 MED ORDER — CITALOPRAM HYDROBROMIDE 10 MG PO TABS: 10.0000 mg | ORAL_TABLET | Freq: Every day | ORAL | Status: AC

## 2017-01-14 MED ORDER — CEFTRIAXONE SODIUM 2 G IJ SOLR: 2.0000 g | INTRAMUSCULAR | Status: DC

## 2017-01-14 MED ORDER — ENSURE ENLIVE PO LIQD: 237.0000 mL | Freq: Two times a day (BID) | ORAL | 12 refills | Status: AC

## 2017-01-14 MED ORDER — PIPERACILLIN-TAZOBACTAM 4.5 G IVPB
4.5000 g | Freq: Three times a day (TID) | INTRAVENOUS | Status: DC
  Filled 2017-01-14 (×2): qty 100

## 2017-01-14 MED ORDER — FUROSEMIDE 40 MG PO TABS: 20.0000 mg | ORAL_TABLET | Freq: Every day | ORAL | Status: AC

## 2017-01-14 MED ORDER — AMMONIUM LACTATE 12 % EX LOTN: TOPICAL_LOTION | Freq: Every day | CUTANEOUS | 0 refills | Status: DC

## 2017-01-14 MED ORDER — CEFTRIAXONE SODIUM-DEXTROSE 2-2.22 GM-% IV SOLR
2.0000 g | INTRAVENOUS | Status: DC
  Administered 2017-01-14: 2 g via INTRAVENOUS
  Filled 2017-01-14 (×2): qty 50

## 2017-01-14 MED ORDER — OXYCODONE HCL 5 MG PO TABS: 5.0000 mg | ORAL_TABLET | Freq: Four times a day (QID) | ORAL | 0 refills | Status: AC | PRN

## 2017-01-14 MED ORDER — DEXTROSE 5 % IV SOLN: 2.0000 g | INTRAVENOUS | Status: AC

## 2017-01-14 MED ORDER — MIDODRINE HCL 5 MG PO TABS: 5.0000 mg | ORAL_TABLET | Freq: Three times a day (TID) | ORAL | Status: AC

## 2017-01-14 MED ORDER — METRONIDAZOLE 500 MG PO TABS: 500.0000 mg | ORAL_TABLET | Freq: Three times a day (TID) | ORAL | 1 refills | Status: AC

## 2017-01-14 NOTE — Progress Notes (Signed)
[facility] [place] at [facility]   PATIENT NAME: [name] BrasMichael Vanetten    MR#:  [id number]  DATE OF BIRTH:  [birth year]  SUBJECTIVE:   Pt. Here due to Sepsis from infected stage 3-4 decubitus ulcer. Remains on IV Zosyn.  No other acute events overnight.   REVIEW OF SYSTEMS:    Review of Systems  Constitutional: Negative for chills and fever.  HENT: Negative for congestion and tinnitus.   Eyes: Negative for blurred vision and double vision.  Respiratory: Negative for cough, shortness of breath and wheezing.   Cardiovascular: Negative for chest pain, orthopnea and PND.  Gastrointestinal: Negative for abdominal pain, diarrhea, nausea and vomiting.  Genitourinary: Negative for dysuria and hematuria.  Neurological: Negative for dizziness, sensory change and focal weakness.  All other systems reviewed and are negative.   Nutrition: Carb modified Tolerating Diet: Yes Tolerating PT: Eval noted.    DRUG ALLERGIES:   Allergies  Allergen Reactions  . Clindamycin/Lincomycin Diarrhea    VITALS:  Blood pressure 116/89, pulse 69, temperature 97.6 F (36.4 C), temperature source Oral, resp. rate 12, height 6\' 4"  (1.93 m), weight 131.5 kg (290 lb), SpO2 92 %.  PHYSICAL EXAMINATION:   Physical Exam  GENERAL:  56 y.o.-year-old obese patient lying in bed in no acute distress.  EYES: Pupils equal, round, reactive to light. No scleral icterus. Extraocular muscles intact.  HEENT: Head atraumatic, normocephalic. Oropharynx and nasopharynx clear.  NECK:  Supple, no jugular venous distention. No thyroid enlargement, no tenderness.  LUNGS: Normal breath sounds bilaterally, no wheezing, rales, rhonchi. No use of accessory muscles of respiration.  CARDIOVASCULAR: S1, S2 normal. No murmurs, rubs, or gallops.  ABDOMEN: Soft, nontender, nondistended. Bowel sounds present. No organomegaly or mass.  EXTREMITIES: No cyanosis, clubbing, +1 edema b/l, signs of chronic venous stasis  b/l NEUROLOGIC: Cranial nerves II through XII are intact. No focal Motor or sensory deficits b/l.  Globally weak PSYCHIATRIC: The patient is alert and oriented x 3.  SKIN: No obvious rash, lesion, Stage 3 Sacral Decub ulcer.    LABORATORY PANEL:   CBC  Recent Labs Lab 01/13/17 0443  WBC 7.4  HGB 10.5*  HCT 30.9*  PLT 79*   ------------------------------------------------------------------------------------------------------------------  Chemistries   Recent Labs Lab 01/11/17 0615  NA 132*  K 4.4  CL 97*  CO2 34*  GLUCOSE 101*  BUN 18  CREATININE 0.86  CALCIUM 8.3*   ------------------------------------------------------------------------------------------------------------------  Cardiac Enzymes No results for input(s): TROPONINI in the last 168 hours. ------------------------------------------------------------------------------------------------------------------  RADIOLOGY:  No results found.   ASSESSMENT AND PLAN:   56 year old male with past medical history of hypertension, CHF, chronic atrial fibrillation, obesity, COPD, alcoholic liver cirrhosis, peripheral vascular disease Who presented to the hospital due to rectal bleeding and shock secondary to bleeding sacral decubitus ulcer.  1. Shock-this was hypovolemic/hemorrhagic shock secondary to bleeding sacral decubitus ulcer. -Patient has been transfused and currently hemoglobin is stable. No evidence of acute bleeding presently.  2. Infected stage 3-4 decubitus ulcer-patient has been seen by surgery and is status post debridement. -Seen by infectious disease and currently on IV Zosyn and needs a long-term course up to 6 weeks. -Continue local wound care and IV antibiotics. May need further debridement down the road.   3. Acute on chronic renal failure-secondary to ATN and hypotension. -Renal function back to baseline now.  4. Chronic A. Fib - rate controlled.  - cont. Sotalol  5. Hx of Diastolic CHF  - clinically not in CHF.  -  cont. Lasix, Aldactone.   6. Depression - cont. Celexa.   7. 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  - cont. Midodrine. BP stable.   Insurance did not approve LTACH placement.  Social Work working on [employer] placement.   All the records are reviewed and case discussed with Care Management/Social Worker. Management plans discussed with the patient, family and they are in agreement.  CODE STATUS: Full  DVT Prophylaxis: Ted's & SCD's.   TOTAL TIME TAKING CARE OF THIS PATIENT: 25 minutes.   POSSIBLE D/C IN 1-2 DAYS, DEPENDING ON CLINICAL CONDITION.   Houston Siren M.D on 01/14/2017 at 1:22 PM  Between 7am to 6pm - Pager - [phone]  After 6pm go to [email or web address] - Social research officer, government  [employer] [place] [facility]  Office  [phone]  CC: Primary care physician; Corky Downs, MD

## 2017-01-14 NOTE — Progress Notes (Signed)
Repot called to  skilled nursing facility report given to Larita Fifeiffany McDowell, Patient is alert and oriented vital signs stable PIcc line double in place site clean dry and intact. Rocephin given as ordered prior to discharge. No acute distress noted. Dressing done prior to discharge.

## 2017-01-14 NOTE — Progress Notes (Signed)
Zosyn  Zosyn dose increased secondary to Actual body weight being > 120 kg per policy.  Demetrius Charityeldrin D. Yesenia Locurto, PharmD

## 2017-01-14 NOTE — Progress Notes (Signed)
Pt refused his celexa reporting that he no longer wished to take it.

## 2017-01-14 NOTE — Discharge Summary (Signed)
[facility] - [place] at [facility]   PATIENT NAME: [name]    MR#:  [id number]  DATE OF BIRTH:  May 14, 1961  DATE OF ADMISSION:  12/26/2016 ADMITTING PHYSICIAN: Arnaldo Natal, MD  DATE OF DISCHARGE: 01/14/2017  PRIMARY CARE PHYSICIAN: MASOUD,[name], MD    ADMISSION DIAGNOSIS:  Sepsis, due to unspecified organism (HCC) [A41.9]  DISCHARGE DIAGNOSIS:  Principal Problem:   Adjustment disorder with mixed anxiety and depressed mood Active Problems:   Sepsis (HCC)   Alcoholic cirrhosis of liver with ascites (HCC)   Decubitus ulcer of sacral region, stage 3 (HCC)   Malnutrition of moderate degree   SECONDARY DIAGNOSIS:   Past Medical History:  Diagnosis Date  . Anal fissure   . Atrial fibrillation (HCC)   . CHF (congestive heart failure) (HCC)   . Heart murmur   . Hypertension   . Lymphadenopathy   . Personal history of colonic polyps     HOSPITAL COURSE:   56 year old male with past medical history of hypertension, CHF, chronic atrial fibrillation, obesity, COPD, alcoholic liver cirrhosis, peripheral vascular disease Who presented to the hospital due to rectal bleeding and shock secondary to bleeding sacral decubitus ulcer.  1. Shock-this was hypovolemic/hemorrhagic shock secondary to bleeding sacral decubitus ulcer. -Patient was aggressively hydrated with IV fluids, given blood transfusions. His Eliquis was stopped.  After blood transfusions and fluids patient's hemoglobin has remained stable, and he is currently hemodynamically stable and therefore being discharged.  2. Infected stage 3-4 decubitus ulcer-patient was been seen by surgery and is status post debridement. - he was also Seen by infectious disease and was on IV Zosyn but that was giving patient Diarrhea.  Pt. is now being discharged on IV Ceftriaxone, Oral Flagyl for 6 weeks.  Stop date on both abx is on 02/15/17.  - pt. May need further debridement down the road.   3. Acute on chronic  renal failure-secondary to ATN and hypotension. After aggressive fluid hydration, blood transfusion in this patient's sepsis and hypotension is improved patient's renal function is back to baseline.  4. Chronic A. Fib - remains rate controlled.  - he will cont. Sotalol.  Will d/c Apixiban for now due to bleeding/shock on admission and this can be readdressed as outpatient by his Cardiologist.   5. Hx of Diastolic CHF - clinically not in CHF presently.  - he will cont. Lasix, Aldactone.   6. Depression - he will cont. Celexa.   7. 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  - he will cont. Low dose Midodrine. BP stable.    Pt. Presently being discharged to SNF for ongoing rehab and chronic wound care.   DISCHARGE CONDITIONS:   Stable.   CONSULTS OBTAINED:  Treatment Team:  Audery Amel, MD Gayla Doss, MD Mick Sell, MD Ricarda Frame, MD  DRUG ALLERGIES:   Allergies  Allergen Reactions  . Clindamycin/Lincomycin Diarrhea    DISCHARGE MEDICATIONS:   Allergies as of 01/14/2017      Reactions   Clindamycin/lincomycin Diarrhea      Medication List    STOP taking these medications   apixaban 5 MG Tabs tablet Commonly known as:  ELIQUIS     TAKE these medications   ALEVE 220 MG tablet Generic drug:  naproxen sodium Take 440 mg by mouth 2 (two) times daily with a meal.   ammonium lactate 12 % lotion Commonly known as:  LAC-HYDRIN Apply topically daily. Start taking on:  01/15/2017   benazepril 40  MG tablet Commonly known as:  LOTENSIN Take 1 tablet (40 mg total) by mouth daily.   cefTRIAXone 2 g in dextrose 5 % 50 mL Inject 2 g into the vein daily.   citalopram 10 MG tablet Commonly known as:  CELEXA Take 1 tablet (10 mg total) by mouth at bedtime.   feeding supplement (ENSURE ENLIVE) Liqd Take 237 mLs by mouth 2 (two) times daily with a meal.   folic acid 1 MG tablet Commonly known as:  FOLVITE Take 1 tablet (1 mg total) by mouth  daily.   furosemide 40 MG tablet Commonly known as:  LASIX Take 0.5 tablets (20 mg total) by mouth daily. What changed:  how much to take   metroNIDAZOLE 500 MG tablet Commonly known as:  FLAGYL Take 1 tablet (500 mg total) by mouth 3 (three) times daily.   midodrine 5 MG tablet Commonly known as:  PROAMATINE Take 1 tablet (5 mg total) by mouth 3 (three) times daily with meals.   oxyCODONE 5 MG immediate release tablet Commonly known as:  Oxy IR/ROXICODONE Take 1 tablet (5 mg total) by mouth every 6 (six) hours as needed for moderate pain or severe pain.   potassium chloride SA 20 MEQ tablet Commonly known as:  K-DUR,KLOR-CON Take 10 mEq by mouth daily.   sotalol 80 MG tablet Commonly known as:  BETAPACE Take 1 tablet (80 mg total) by mouth 2 (two) times daily.   spironolactone 25 MG tablet Commonly known as:  ALDACTONE Take 0.5 tablets (12.5 mg total) by mouth daily. Start taking on:  01/15/2017         DISCHARGE INSTRUCTIONS:   DIET:  Cardiac diet  DISCHARGE CONDITION:  Stable  ACTIVITY:  Activity as tolerated  OXYGEN:  Home Oxygen: No.   Oxygen Delivery: room air  DISCHARGE LOCATION:  nursing home   If you experience worsening of your admission symptoms, develop shortness of breath, life threatening emergency, suicidal or homicidal thoughts you must seek medical attention immediately by calling 911 or calling your MD immediately  if symptoms less severe.  You Must read complete instructions/literature along with all the possible adverse reactions/side effects for all the Medicines you take and that have been prescribed to you. Take any new Medicines after you have completely understood and accpet all the possible adverse reactions/side effects.   Please note  You were cared for by a hospitalist during your hospital stay. If you have any questions about your discharge medications or the care you received while you were in the hospital after you are  discharged, you can call the unit and asked to speak with the hospitalist on call if the hospitalist that took care of you is not available. Once you are discharged, your primary care physician will handle any further medical issues. Please note that NO REFILLS for any discharge medications will be authorized once you are discharged, as it is imperative that you return to your primary care physician (or establish a relationship with a primary care physician if you do not have one) for your aftercare needs so that they can reassess your need for medications and monitor your lab values.   DATA REVIEW:   CBC  Recent Labs Lab 01/13/17 0443  WBC 7.4  HGB 10.5*  HCT 30.9*  PLT 79*    Chemistries   Recent Labs Lab 01/11/17 0615  NA 132*  K 4.4  CL 97*  CO2 34*  GLUCOSE 101*  BUN 18  CREATININE 0.86  CALCIUM 8.3*    Cardiac Enzymes No results for input(s): TROPONINI in the last 168 hours.  Microbiology Results  Results for orders placed or performed during the hospital encounter of 12/26/16  Culture, blood (routine x 2)     Status: Abnormal   Collection Time: 12/27/16 12:40 AM  Result Value Ref Range Status   Specimen Description BLOOD RIGHT HAND  Final   Special Requests   Final    BOTTLES DRAWN AEROBIC AND ANAEROBIC AERO5ML,ANAERO4ML   Culture  Setup Time   Final    GRAM POSITIVE RODS ANAEROBIC BOTTLE ONLY CRITICAL VALUE NOTED.  VALUE IS CONSISTENT WITH PREVIOUSLY REPORTED AND CALLED VALUE.    Culture (A)  Final    ANAEROBIC GRAM POSITIVE RODS UNABLE TO FURTHER IDENTIFY. CORRECTED RESULTS CALLED TO: A ROBINSON,RN AT 8119 01/03/17 BY L BENFIELD PREVIOUSLY REPORTED AS DIPTHEROIDS Performed at [facility], [address], [place], [place] [address]    Report Status 01/03/2017 FINAL  Final  Culture, blood (routine x 2)     Status: Abnormal   Collection Time: 12/27/16 12:40 AM  Result Value Ref Range Status   Specimen Description BLOOD LEFT HAND  Final   Special  Requests   Final    BOTTLES DRAWN AEROBIC AND ANAEROBIC AERO3ML,ANAERO10ML   Culture  Setup Time   Final    GRAM POSITIVE RODS ANAEROBIC BOTTLE ONLY CRITICAL RESULT CALLED TO, READ BACK BY AND VERIFIED WITH: HANK ZOMPA 12/29/16 0900 SGD    Culture (A)  Final    ANAEROBIC GRAM POSITIVE RODS UNABLE TO FURTHER IDENTIFY. Performed at [facility], [address] [place], [place] [address]    Report Status 01/02/2017 FINAL  Final  Blood Culture ID Panel (Reflexed)     Status: None   Collection Time: 12/27/16 12:40 AM  Result Value Ref Range Status   Enterococcus species NOT DETECTED NOT DETECTED Final   Listeria monocytogenes NOT DETECTED NOT DETECTED Final   Staphylococcus species NOT DETECTED NOT DETECTED Final   Staphylococcus aureus NOT DETECTED NOT DETECTED Final   Streptococcus species NOT DETECTED NOT DETECTED Final   Streptococcus agalactiae NOT DETECTED NOT DETECTED Final   Streptococcus pneumoniae NOT DETECTED NOT DETECTED Final   Streptococcus pyogenes NOT DETECTED NOT DETECTED Final   Acinetobacter baumannii NOT DETECTED NOT DETECTED Final   Enterobacteriaceae species NOT DETECTED NOT DETECTED Final   Enterobacter cloacae complex NOT DETECTED NOT DETECTED Final   Escherichia coli NOT DETECTED NOT DETECTED Final   Klebsiella oxytoca NOT DETECTED NOT DETECTED Final   Klebsiella pneumoniae NOT DETECTED NOT DETECTED Final   Proteus species NOT DETECTED NOT DETECTED Final   Serratia marcescens NOT DETECTED NOT DETECTED Final   Haemophilus influenzae NOT DETECTED NOT DETECTED Final   Neisseria meningitidis NOT DETECTED NOT DETECTED Final   Pseudomonas aeruginosa NOT DETECTED NOT DETECTED Final   Candida albicans NOT DETECTED NOT DETECTED Final   Candida glabrata NOT DETECTED NOT DETECTED Final   Candida krusei NOT DETECTED NOT DETECTED Final   Candida parapsilosis NOT DETECTED NOT DETECTED Final   Candida tropicalis NOT DETECTED NOT DETECTED Final  MRSA PCR Screening      Status: None   Collection Time: 12/27/16  6:30 AM  Result Value Ref Range Status   MRSA by PCR NEGATIVE NEGATIVE Final    Comment:        The GeneXpert MRSA Assay (FDA approved for NASAL specimens only), is one component of a comprehensive MRSA colonization surveillance program.  It is not intended to diagnose MRSA infection nor to guide or monitor treatment for MRSA infections.   Urine culture     Status: None   Collection Time: 12/27/16 11:32 AM  Result Value Ref Range Status   Specimen Description URINE, CLEAN CATCH  Final   Special Requests NONE  Final   Culture   Final    NO GROWTH Performed at [facility], [address] [place], [place] [address]    Report Status 12/28/2016 FINAL  Final  Aerobic/Anaerobic Culture (surgical/deep wound)     Status: None   Collection Time: 01/02/17 10:23 AM  Result Value Ref Range Status   Specimen Description DECUBITIS  Final   Special Requests NONE  Final   Gram Stain   Final    MODERATE WBC PRESENT, PREDOMINANTLY PMN FEW GRAM POSITIVE COCCI IN PAIRS FEW GRAM POSITIVE RODS RARE GRAM NEGATIVE RODS    Culture   Final    MODERATE NORMAL SKIN FLORA FEW BACTEROIDES VULGATUS BETA LACTAMASE POSITIVE Performed at [facility], [address], [place], [place] [address]    Report Status 01/07/2017 FINAL  Final  C difficile quick scan w PCR reflex     Status: None   Collection Time: 01/02/17 11:24 AM  Result Value Ref Range Status   C Diff antigen NEGATIVE NEGATIVE Final   C Diff toxin NEGATIVE NEGATIVE Final   C Diff interpretation No C. difficile detected.  Final  CULTURE, BLOOD (ROUTINE X 2) w Reflex to ID Panel     Status: None   Collection Time: 01/02/17  3:26 PM  Result Value Ref Range Status   Specimen Description BLOOD RIGHT HAND  Final   Special Requests   Final    BOTTLES DRAWN AEROBIC AND ANAEROBIC ANA14ML AER11ML   Culture NO GROWTH 5 DAYS  Final   Report Status 01/07/2017 FINAL  Final  CULTURE, BLOOD  (ROUTINE X 2) w Reflex to ID Panel     Status: None   Collection Time: 01/02/17  3:27 PM  Result Value Ref Range Status   Specimen Description BLOOD LEFT ASSIST CONTROL  Final   Special Requests BOTTLES DRAWN AEROBIC AND ANAEROBIC AER9ML ANA8ML  Final   Culture NO GROWTH 5 DAYS  Final   Report Status 01/07/2017 FINAL  Final  Body fluid culture     Status: None   Collection Time: 01/03/17  2:25 PM  Result Value Ref Range Status   Specimen Description PERITONEAL  Final   Special Requests NONE  Final   Gram Stain   Final    RARE WBC PRESENT,BOTH PMN AND MONONUCLEAR NO ORGANISMS SEEN    Culture   Final    NO GROWTH 3 DAYS Performed at [facility], [address] [place], [place] [address]    Report Status 01/06/2017 FINAL  Final  Gastrointestinal Panel by PCR , Stool     Status: None   Collection Time: 01/11/17 10:34 AM  Result Value Ref Range Status   Campylobacter species NOT DETECTED NOT DETECTED Final   Plesimonas shigelloides NOT DETECTED NOT DETECTED Final   Salmonella species NOT DETECTED NOT DETECTED Final   Yersinia enterocolitica NOT DETECTED NOT DETECTED Final   Vibrio species NOT DETECTED NOT DETECTED Final   Vibrio cholerae NOT DETECTED NOT DETECTED Final   Enteroaggregative E coli (EAEC) NOT DETECTED NOT DETECTED Final   Enteropathogenic E coli (EPEC) NOT DETECTED NOT DETECTED Final   Enterotoxigenic E coli (ETEC) NOT  DETECTED NOT DETECTED Final   Shiga like toxin producing E coli (STEC) NOT DETECTED NOT DETECTED Final   Shigella/Enteroinvasive E coli (EIEC) NOT DETECTED NOT DETECTED Final   Cryptosporidium NOT DETECTED NOT DETECTED Final   Cyclospora cayetanensis NOT DETECTED NOT DETECTED Final   Entamoeba histolytica NOT DETECTED NOT DETECTED Final   Giardia lamblia NOT DETECTED NOT DETECTED Final   Adenovirus F40/41 NOT DETECTED NOT DETECTED Final   Astrovirus NOT DETECTED NOT DETECTED Final   Norovirus GI/GII NOT DETECTED NOT DETECTED Final   Rotavirus  A NOT DETECTED NOT DETECTED Final   Sapovirus (I, II, IV, and V) NOT DETECTED NOT DETECTED Final    RADIOLOGY:  No results found.    Management plans discussed with the patient, family and they are in agreement.  CODE STATUS:     Code Status Orders        Start     Ordered   12/27/16 9562  Full code  Continuous     12/27/16 0632    Code Status History    Date Active Date Inactive Code Status Order ID Comments User Context   12/15/2016  8:06 PM 12/21/2016  4:29 PM Full Code [id number]  Altamese Dilling, MD ED   04/17/2016  7:26 PM 04/19/2016  5:50 PM Full Code [id number]  Wyatt Haste, MD ED      TOTAL TIME TAKING CARE OF THIS PATIENT: 40 minutes.    Houston Siren M.D on 01/14/2017 at 2:38 PM  Between 7am to 6pm - Pager - [phone]  After 6pm go to [email or web address] - Social research officer, government  [employer] [place] [facility]  Office  [phone]  CC: Primary care physician; Corky Downs, MD

## 2017-01-14 NOTE — Progress Notes (Addendum)
[facility] Date of Admission:  12/26/2016     ID: [name] RouteMichael J Peppard is a 56 y.o. male with infected decub ulcer Principal Problem:   Adjustment disorder with mixed anxiety and depressed mood Active Problems:   Sepsis (HCC)   Alcoholic cirrhosis of liver with ascites (HCC)   Decubitus ulcer of sacral region, stage 3 (HCC)   Malnutrition of moderate degree   Subjective: Having diarrhea -  had rectal tube but removed this am.  ROS  Eleven systems are reviewed and negative except per hpi  Medications:  Antibiotics Given (last 72 hours)    Date/Time Action Medication Dose Rate   01/11/17 1542 Given   piperacillin-tazobactam (ZOSYN) IVPB 3.375 g 3.375 g 12.5 mL/hr   01/11/17 2356 Given   piperacillin-tazobactam (ZOSYN) IVPB 3.375 g 3.375 g 12.5 mL/hr   01/12/17 0543 Given   piperacillin-tazobactam (ZOSYN) IVPB 3.375 g 3.375 g 12.5 mL/hr   01/12/17 1533 Given   piperacillin-tazobactam (ZOSYN) IVPB 3.375 g 3.375 g 12.5 mL/hr   01/12/17 2229 Given   piperacillin-tazobactam (ZOSYN) IVPB 3.375 g 3.375 g 12.5 mL/hr   01/13/17 0446 Given   piperacillin-tazobactam (ZOSYN) IVPB 3.375 g 3.375 g 12.5 mL/hr   01/13/17 1610 Given   piperacillin-tazobactam (ZOSYN) IVPB 3.375 g 3.375 g 12.5 mL/hr   01/13/17 2252 Given   piperacillin-tazobactam (ZOSYN) IVPB 3.375 g 3.375 g 12.5 mL/hr   01/14/17 0531 Given   piperacillin-tazobactam (ZOSYN) IVPB 3.375 g 3.375 g 12.5 mL/hr     . ammonium lactate   Topical Daily  . cefTRIAXone (ROCEPHIN)  IV  2 g Intravenous Q24H  . citalopram  10 mg Oral QHS  . diphenhydrAMINE  12.5 mg Intravenous Once  . feeding supplement (ENSURE ENLIVE)  237 mL Oral BID WC  . folic acid  1 mg Oral Daily  . furosemide  20 mg Oral Daily  . midodrine  5 mg Oral TID WC  . multivitamin with minerals  1 tablet Oral Daily  . pneumococcal 23 valent vaccine  0.5 mL Intramuscular Tomorrow-1000  . potassium chloride  20 mEq Oral Daily  .  protein supplement shake  11 oz Oral BID BM  . sodium chloride flush  10 mL Intravenous Q12H  . sodium chloride flush  3 mL Intravenous Q12H  . sodium hypochlorite   Irrigation BID  . sotalol  80 mg Oral BID  . spironolactone  12.5 mg Oral Daily    Objective: Vital signs in last 24 hours: Temp:  [97.6 F (36.4 C)-98.2 F (36.8 C)] 97.6 F (36.4 C) (02/02 0817) Pulse Rate:  [61-79] 69 (02/02 0817) Resp:  [12-18] 12 (02/02 0817) BP: (108-127)/(76-89) 116/89 (02/02 0817) SpO2:  [91 %-97 %] 92 % (02/02 0817) Weight:  [131.5 kg (290 lb)] 131.5 kg (290 lb) (02/02 0456) Constitutional: He is oriented to person, place, and time. Obese, chronically ill appearing HENT: mild icterus Mouth/Throat: Oropharynx is clear and moist. No oropharyngeal exudate.  Cardiovascular: Normal rate, regular rhythm and normal heart sounds. picc rue Pulmonary/Chest: Effort normal and breath sounds normal. No respiratory distress. He has no wheezes.  Abdominal: Soft. Bowel sounds are normal. Distended, but not tense Lymphadenopathy: He has no cervical adenopathy.  Neurological: He is alert and oriented to person, place, and time.  Skin: sacrum wound - quite deep but clean base after debridement. Bone just palpable under thin layer of tissue Psychiatric: anxious  Lab Results  Recent Labs  01/13/17 0443  WBC 7.4  HGB 10.5*  HCT 30.9*    Microbiology: Results for orders placed or performed during the hospital encounter of 12/26/16  Culture, blood (routine x 2)     Status: Abnormal   Collection Time: 12/27/16 12:40 AM  Result Value Ref Range Status   Specimen Description BLOOD RIGHT HAND  Final   Special Requests   Final    BOTTLES DRAWN AEROBIC AND ANAEROBIC AERO5ML,ANAERO4ML   Culture  Setup Time   Final    GRAM POSITIVE RODS ANAEROBIC BOTTLE ONLY CRITICAL VALUE NOTED.  VALUE IS CONSISTENT WITH PREVIOUSLY REPORTED AND CALLED VALUE.    Culture (A)  Final    ANAEROBIC GRAM POSITIVE RODS UNABLE TO  FURTHER IDENTIFY. CORRECTED RESULTS CALLED TO: A ROBINSON,RN AT 1610 01/03/17 BY L BENFIELD PREVIOUSLY REPORTED AS DIPTHEROIDS Performed at [facility], [address], [place], [place] [address]    Report Status 01/03/2017 FINAL  Final  Culture, blood (routine x 2)     Status: Abnormal   Collection Time: 12/27/16 12:40 AM  Result Value Ref Range Status   Specimen Description BLOOD LEFT HAND  Final   Special Requests   Final    BOTTLES DRAWN AEROBIC AND ANAEROBIC AERO3ML,ANAERO10ML   Culture  Setup Time   Final    GRAM POSITIVE RODS ANAEROBIC BOTTLE ONLY CRITICAL RESULT CALLED TO, READ BACK BY AND VERIFIED WITH: HANK ZOMPA 12/29/16 0900 SGD    Culture (A)  Final    ANAEROBIC GRAM POSITIVE RODS UNABLE TO FURTHER IDENTIFY. Performed at [facility], [address], [place], [place] [address]    Report Status 01/02/2017 FINAL  Final  Blood Culture ID Panel (Reflexed)     Status: None   Collection Time: 12/27/16 12:40 AM  Result Value Ref Range Status   Enterococcus species NOT DETECTED NOT DETECTED Final   Listeria monocytogenes NOT DETECTED NOT DETECTED Final   Staphylococcus species NOT DETECTED NOT DETECTED Final   Staphylococcus aureus NOT DETECTED NOT DETECTED Final   Streptococcus species NOT DETECTED NOT DETECTED Final   Streptococcus agalactiae NOT DETECTED NOT DETECTED Final   Streptococcus pneumoniae NOT DETECTED NOT DETECTED Final   Streptococcus pyogenes NOT DETECTED NOT DETECTED Final   Acinetobacter baumannii NOT DETECTED NOT DETECTED Final   Enterobacteriaceae species NOT DETECTED NOT DETECTED Final   Enterobacter cloacae complex NOT DETECTED NOT DETECTED Final   Escherichia coli NOT DETECTED NOT DETECTED Final   Klebsiella oxytoca NOT DETECTED NOT DETECTED Final   Klebsiella pneumoniae NOT DETECTED NOT DETECTED Final   Proteus species NOT DETECTED NOT DETECTED Final   Serratia marcescens NOT DETECTED NOT DETECTED Final   Haemophilus influenzae NOT  DETECTED NOT DETECTED Final   Neisseria meningitidis NOT DETECTED NOT DETECTED Final   Pseudomonas aeruginosa NOT DETECTED NOT DETECTED Final   Candida albicans NOT DETECTED NOT DETECTED Final   Candida glabrata NOT DETECTED NOT DETECTED Final   Candida krusei NOT DETECTED NOT DETECTED Final   Candida parapsilosis NOT DETECTED NOT DETECTED Final   Candida tropicalis NOT DETECTED NOT DETECTED Final  MRSA PCR Screening     Status: None   Collection Time: 12/27/16  6:30 AM  Result Value Ref Range Status   MRSA by PCR NEGATIVE NEGATIVE Final    Comment:        The GeneXpert MRSA Assay (FDA approved for NASAL specimens only), is one component of a comprehensive MRSA colonization surveillance program. It is not intended to diagnose MRSA infection nor to guide or monitor treatment for  MRSA infections.   Urine culture     Status: None   Collection Time: 12/27/16 11:32 AM  Result Value Ref Range Status   Specimen Description URINE, CLEAN CATCH  Final   Special Requests NONE  Final   Culture   Final    NO GROWTH Performed at [facility], [address] [place], [place] [address]    Report Status 12/28/2016 FINAL  Final  Aerobic/Anaerobic Culture (surgical/deep wound)     Status: None   Collection Time: 01/02/17 10:23 AM  Result Value Ref Range Status   Specimen Description DECUBITIS  Final   Special Requests NONE  Final   Gram Stain   Final    MODERATE WBC PRESENT, PREDOMINANTLY PMN FEW GRAM POSITIVE COCCI IN PAIRS FEW GRAM POSITIVE RODS RARE GRAM NEGATIVE RODS    Culture   Final    MODERATE NORMAL SKIN FLORA FEW BACTEROIDES VULGATUS BETA LACTAMASE POSITIVE Performed at [facility], [address], [place], [place] [address]    Report Status 01/07/2017 FINAL  Final  C difficile quick scan w PCR reflex     Status: None   Collection Time: 01/02/17 11:24 AM  Result Value Ref Range Status   C Diff antigen NEGATIVE NEGATIVE Final   C Diff toxin NEGATIVE NEGATIVE  Final   C Diff interpretation No C. difficile detected.  Final  CULTURE, BLOOD (ROUTINE X 2) w Reflex to ID Panel     Status: None   Collection Time: 01/02/17  3:26 PM  Result Value Ref Range Status   Specimen Description BLOOD RIGHT HAND  Final   Special Requests   Final    BOTTLES DRAWN AEROBIC AND ANAEROBIC ANA14ML AER11ML   Culture NO GROWTH 5 DAYS  Final   Report Status 01/07/2017 FINAL  Final  CULTURE, BLOOD (ROUTINE X 2) w Reflex to ID Panel     Status: None   Collection Time: 01/02/17  3:27 PM  Result Value Ref Range Status   Specimen Description BLOOD LEFT ASSIST CONTROL  Final   Special Requests BOTTLES DRAWN AEROBIC AND ANAEROBIC AER9ML ANA8ML  Final   Culture NO GROWTH 5 DAYS  Final   Report Status 01/07/2017 FINAL  Final  Body fluid culture     Status: None   Collection Time: 01/03/17  2:25 PM  Result Value Ref Range Status   Specimen Description PERITONEAL  Final   Special Requests NONE  Final   Gram Stain   Final    RARE WBC PRESENT,BOTH PMN AND MONONUCLEAR NO ORGANISMS SEEN    Culture   Final    NO GROWTH 3 DAYS Performed at [facility], [address] [place], [place] [address]    Report Status 01/06/2017 FINAL  Final  Gastrointestinal Panel by PCR , Stool     Status: None   Collection Time: 01/11/17 10:34 AM  Result Value Ref Range Status   Campylobacter species NOT DETECTED NOT DETECTED Final   Plesimonas shigelloides NOT DETECTED NOT DETECTED Final   Salmonella species NOT DETECTED NOT DETECTED Final   Yersinia enterocolitica NOT DETECTED NOT DETECTED Final   Vibrio species NOT DETECTED NOT DETECTED Final   Vibrio cholerae NOT DETECTED NOT DETECTED Final   Enteroaggregative E coli (EAEC) NOT DETECTED NOT DETECTED Final   Enteropathogenic E coli (EPEC) NOT DETECTED NOT DETECTED Final   Enterotoxigenic E coli (ETEC) NOT DETECTED NOT DETECTED Final   Shiga like toxin producing E coli (STEC) NOT DETECTED  NOT DETECTED Final   Shigella/Enteroinvasive  E coli (EIEC) NOT DETECTED NOT DETECTED Final   Cryptosporidium NOT DETECTED NOT DETECTED Final   Cyclospora cayetanensis NOT DETECTED NOT DETECTED Final   Entamoeba histolytica NOT DETECTED NOT DETECTED Final   Giardia lamblia NOT DETECTED NOT DETECTED Final   Adenovirus F40/41 NOT DETECTED NOT DETECTED Final   Astrovirus NOT DETECTED NOT DETECTED Final   Norovirus GI/GII NOT DETECTED NOT DETECTED Final   Rotavirus A NOT DETECTED NOT DETECTED Final   Sapovirus (I, II, IV, and V) NOT DETECTED NOT DETECTED Final      Studies/Results: No results found.  Assessment/Plan: BOHDAN MACHO is a 56 y.o. male admitted with bleeding per rectum and bleeding sacral wound as well as fever, leukocytosis and bcx + anaerobic gram positive rods in 2/2 . Has been seen by surgery and wound care. CT shows inflammation down to coccyx and lower sacrum.  The wound seems to have worsened since last seen by surgery and wound care and now has odor, drainage and extensive necrotic tissue. Clinically stable however. Has advanced cirrhosis with ascites.  Dr Tonita Cong did bedside debridement 1/27 and wound looks better but still very impressive.  PICC placed- cx with skin flora Some diarrhea likely from Zosyn,   Recommendations Having watery stools- may be due to Zosyn Change to ctx and flagyl - stop date 3/6 See abx order sheet  Going to [facility]   Thank you very much for the consult. Will follow with you.  Blakeleigh Domek P   01/14/2017, 2:15 PM

## 2017-01-14 NOTE — Clinical Social Work Note (Signed)
CSW [place] [facility] [place] [facility] that they have received [facility] and that patient can come to them today. MD to discharge patient today and patient and his girlfriend are aware. Discharge information sent and nurse to call report. [place] SpanielMonica Elexis Pollak MSW,LCSW [phone]

## 2017-01-14 NOTE — Consult Note (Signed)
WOC Nurse wound follow up Unstageable pressure injury to coccyx, bilateral buttocks.  We have implemented Dakin's moist gauze for debridement and odor control.  Surgery has performed sharps debridement as well and ordered to continue Dakin's moist gauze for continued debridement. Chronic skin changes to bilateral lower extremities. Will moisturize daily.  Wound type:Unstageable pressure injury.not a surgical candidate at this time.  Pressure Injury POA: Yes Measurement: Coccyx: 4 cm x 3.4 cm 40% necrotic wound bed. Right buttocks 3 cm x 2 cm pale pink wound bed.  Wound bed:  Improving.  Less necrotic tissue.    Periwound:2 areas of medical adhesive related skin injury (MARSI) to periwound. 2 cm x 1 cm x 0.1 cm , resolving Dressing procedure/placement/frequency:Cleanse sacral and buttocks wounds with NS. Gently fill necrotic wound with Dakin's moistened kerlix. Cover with 4x4 gauze and ABD pad/tape. Change twice daily. Cover periwound denuded skin with silicone border foam dressings. Change daily. Turn and reposition every two hours.  Will need mattress replacement with low air loss feature when transferred to the floor.  Will not follow at this time.  Please re-consult if needed. Surgery is following.  S/P sharps debridement.  Maple HudsonKaren Miyeko Mahlum RN BSN CWON Pager [phone]

## 2017-01-14 NOTE — Clinical Social Work Placement (Signed)
   CLINICAL SOCIAL WORK PLACEMENT  NOTE  Date:  01/14/2017  Patient Details  Name: [name] MRN: [id number] Date of Birth: 11-25-1961  Clinical Social Work is seeking post-discharge placement for this patient at the Skilled  Nursing Facility level of care (*CSW will initial, date and re-position this form in  chart as items are completed):  Yes   Patient/family provided with [facility] Clinical Social Work Department's list of facilities offering this level of care within the geographic area requested by the patient (or if unable, by the patient's family).  Yes   Patient/family informed of their freedom to choose among providers that offer the needed level of care, that participate in Medicare, Medicaid or managed care program needed by the patient, have an available bed and are willing to accept the patient.      Patient/family informed of [facility]'s ownership interest in [facility] and [facility], as well as of the fact that they are under no obligation to receive care at these facilities.  PASRR submitted to EDS on 01/20/17     PASRR number received on 01/20/17     Existing PASRR number confirmed on       FL2 transmitted to all facilities in geographic area requested by pt/family on       FL2 transmitted to all facilities within larger geographic area on       Patient informed that his/her managed care company has contracts with or will negotiate with certain facilities, including the following:        Yes   Patient/family informed of bed offers received.  Patient chooses bed at  [facility]     Physician recommends and patient chooses bed at  [facility]    Patient to be transferred to  [facility]) on 01/14/17.  Patient to be transferred to facility by  (EMS)     Patient family notified on 01/14/17 of transfer.  Name of family member notified:        PHYSICIAN       Additional Comment:     _______________________________________________ [name], LCSW 01/14/2017, 2:29 PM

## 2017-01-14 NOTE — Progress Notes (Signed)
Infectious Disease Long Term IV Antibiotic Orders  Diagnosis: Sacral decubitus ulcer and osteomyelitis   Culture results Bacteroides  Allergies:  Allergies  Allergen Reactions  . Clindamycin/Lincomycin Diarrhea    Discharge antibiotics Ceftriaxone 2 grams every        24   Hours  Oral flagyl 500 mg TID until March 6th  PICC Care per protocol Labs weekly while on IV antibiotics      CBC w diff   Comprehensive met panel  CRP     Planned duration of antibiotics  Stop date March 6th 2018  Follow up clinic date approx 4 weeks FAX weekly labs to [phone]  Leonel Ramsay, MD

## 2017-01-14 NOTE — Progress Notes (Signed)
Patient picked up by EMS and taken to [facility] care. Patient is alert and oriented, no acute distress noted. Vital signs stable

## 2017-01-21 ENCOUNTER — Encounter: Payer: Self-pay | Admitting: Emergency Medicine

## 2017-01-21 ENCOUNTER — Inpatient Hospital Stay: Payer: BC Managed Care – PPO

## 2017-01-21 ENCOUNTER — Emergency Department: Payer: BC Managed Care – PPO

## 2017-01-21 ENCOUNTER — Inpatient Hospital Stay
Admission: EM | Admit: 2017-01-21 | Discharge: 2017-02-10 | DRG: 871 | Disposition: E | Payer: BC Managed Care – PPO | Attending: Internal Medicine | Admitting: Internal Medicine

## 2017-01-21 DIAGNOSIS — I248 Other forms of acute ischemic heart disease: Secondary | ICD-10-CM | POA: Diagnosis present

## 2017-01-21 DIAGNOSIS — E43 Unspecified severe protein-calorie malnutrition: Secondary | ICD-10-CM | POA: Insufficient documentation

## 2017-01-21 DIAGNOSIS — R6521 Severe sepsis with septic shock: Secondary | ICD-10-CM | POA: Diagnosis present

## 2017-01-21 DIAGNOSIS — E872 Acidosis: Secondary | ICD-10-CM | POA: Diagnosis present

## 2017-01-21 DIAGNOSIS — Z66 Do not resuscitate: Secondary | ICD-10-CM | POA: Diagnosis not present

## 2017-01-21 DIAGNOSIS — I959 Hypotension, unspecified: Secondary | ICD-10-CM | POA: Diagnosis present

## 2017-01-21 DIAGNOSIS — E871 Hypo-osmolality and hyponatremia: Secondary | ICD-10-CM | POA: Diagnosis present

## 2017-01-21 DIAGNOSIS — Z515 Encounter for palliative care: Secondary | ICD-10-CM | POA: Diagnosis not present

## 2017-01-21 DIAGNOSIS — R112 Nausea with vomiting, unspecified: Secondary | ICD-10-CM

## 2017-01-21 DIAGNOSIS — D696 Thrombocytopenia, unspecified: Secondary | ICD-10-CM | POA: Diagnosis present

## 2017-01-21 DIAGNOSIS — N2581 Secondary hyperparathyroidism of renal origin: Secondary | ICD-10-CM | POA: Diagnosis present

## 2017-01-21 DIAGNOSIS — N179 Acute kidney failure, unspecified: Secondary | ICD-10-CM | POA: Diagnosis present

## 2017-01-21 DIAGNOSIS — R000 Tachycardia, unspecified: Secondary | ICD-10-CM | POA: Diagnosis not present

## 2017-01-21 DIAGNOSIS — Z79899 Other long term (current) drug therapy: Secondary | ICD-10-CM

## 2017-01-21 DIAGNOSIS — Y95 Nosocomial condition: Secondary | ICD-10-CM

## 2017-01-21 DIAGNOSIS — K922 Gastrointestinal hemorrhage, unspecified: Secondary | ICD-10-CM | POA: Diagnosis present

## 2017-01-21 DIAGNOSIS — Z452 Encounter for adjustment and management of vascular access device: Secondary | ICD-10-CM

## 2017-01-21 DIAGNOSIS — J90 Pleural effusion, not elsewhere classified: Secondary | ICD-10-CM | POA: Diagnosis present

## 2017-01-21 DIAGNOSIS — K921 Melena: Secondary | ICD-10-CM | POA: Diagnosis present

## 2017-01-21 DIAGNOSIS — J9811 Atelectasis: Secondary | ICD-10-CM | POA: Diagnosis present

## 2017-01-21 DIAGNOSIS — Z8249 Family history of ischemic heart disease and other diseases of the circulatory system: Secondary | ICD-10-CM

## 2017-01-21 DIAGNOSIS — R601 Generalized edema: Secondary | ICD-10-CM | POA: Diagnosis present

## 2017-01-21 DIAGNOSIS — I739 Peripheral vascular disease, unspecified: Secondary | ICD-10-CM | POA: Diagnosis present

## 2017-01-21 DIAGNOSIS — D539 Nutritional anemia, unspecified: Secondary | ICD-10-CM | POA: Diagnosis present

## 2017-01-21 DIAGNOSIS — K766 Portal hypertension: Secondary | ICD-10-CM | POA: Diagnosis present

## 2017-01-21 DIAGNOSIS — R197 Diarrhea, unspecified: Secondary | ICD-10-CM

## 2017-01-21 DIAGNOSIS — Z791 Long term (current) use of non-steroidal anti-inflammatories (NSAID): Secondary | ICD-10-CM

## 2017-01-21 DIAGNOSIS — R262 Difficulty in walking, not elsewhere classified: Secondary | ICD-10-CM

## 2017-01-21 DIAGNOSIS — G9341 Metabolic encephalopathy: Secondary | ICD-10-CM | POA: Diagnosis present

## 2017-01-21 DIAGNOSIS — J189 Pneumonia, unspecified organism: Secondary | ICD-10-CM | POA: Diagnosis present

## 2017-01-21 DIAGNOSIS — L89154 Pressure ulcer of sacral region, stage 4: Secondary | ICD-10-CM | POA: Diagnosis present

## 2017-01-21 DIAGNOSIS — A419 Sepsis, unspecified organism: Principal | ICD-10-CM

## 2017-01-21 DIAGNOSIS — R680 Hypothermia, not associated with low environmental temperature: Secondary | ICD-10-CM | POA: Diagnosis not present

## 2017-01-21 DIAGNOSIS — M6281 Muscle weakness (generalized): Secondary | ICD-10-CM

## 2017-01-21 DIAGNOSIS — K3189 Other diseases of stomach and duodenum: Secondary | ICD-10-CM | POA: Diagnosis present

## 2017-01-21 DIAGNOSIS — I5032 Chronic diastolic (congestive) heart failure: Secondary | ICD-10-CM | POA: Diagnosis present

## 2017-01-21 DIAGNOSIS — J9690 Respiratory failure, unspecified, unspecified whether with hypoxia or hypercapnia: Secondary | ICD-10-CM

## 2017-01-21 DIAGNOSIS — G4733 Obstructive sleep apnea (adult) (pediatric): Secondary | ICD-10-CM | POA: Diagnosis present

## 2017-01-21 DIAGNOSIS — J9601 Acute respiratory failure with hypoxia: Secondary | ICD-10-CM | POA: Diagnosis present

## 2017-01-21 DIAGNOSIS — R011 Cardiac murmur, unspecified: Secondary | ICD-10-CM | POA: Diagnosis present

## 2017-01-21 DIAGNOSIS — R34 Anuria and oliguria: Secondary | ICD-10-CM | POA: Diagnosis present

## 2017-01-21 DIAGNOSIS — K7031 Alcoholic cirrhosis of liver with ascites: Secondary | ICD-10-CM | POA: Diagnosis not present

## 2017-01-21 DIAGNOSIS — Z881 Allergy status to other antibiotic agents status: Secondary | ICD-10-CM

## 2017-01-21 DIAGNOSIS — K7290 Hepatic failure, unspecified without coma: Secondary | ICD-10-CM | POA: Diagnosis present

## 2017-01-21 DIAGNOSIS — I872 Venous insufficiency (chronic) (peripheral): Secondary | ICD-10-CM | POA: Diagnosis present

## 2017-01-21 DIAGNOSIS — L03317 Cellulitis of buttock: Secondary | ICD-10-CM | POA: Diagnosis present

## 2017-01-21 DIAGNOSIS — K219 Gastro-esophageal reflux disease without esophagitis: Secondary | ICD-10-CM | POA: Diagnosis present

## 2017-01-21 DIAGNOSIS — R1310 Dysphagia, unspecified: Secondary | ICD-10-CM | POA: Diagnosis present

## 2017-01-21 DIAGNOSIS — E8809 Other disorders of plasma-protein metabolism, not elsewhere classified: Secondary | ICD-10-CM | POA: Diagnosis present

## 2017-01-21 DIAGNOSIS — R739 Hyperglycemia, unspecified: Secondary | ICD-10-CM | POA: Diagnosis present

## 2017-01-21 DIAGNOSIS — F17210 Nicotine dependence, cigarettes, uncomplicated: Secondary | ICD-10-CM | POA: Diagnosis present

## 2017-01-21 DIAGNOSIS — Z6837 Body mass index (BMI) 37.0-37.9, adult: Secondary | ICD-10-CM

## 2017-01-21 DIAGNOSIS — R5381 Other malaise: Secondary | ICD-10-CM | POA: Diagnosis not present

## 2017-01-21 DIAGNOSIS — I482 Chronic atrial fibrillation: Secondary | ICD-10-CM | POA: Diagnosis present

## 2017-01-21 DIAGNOSIS — Z7289 Other problems related to lifestyle: Secondary | ICD-10-CM

## 2017-01-21 DIAGNOSIS — I110 Hypertensive heart disease with heart failure: Secondary | ICD-10-CM | POA: Diagnosis present

## 2017-01-21 DIAGNOSIS — Z95818 Presence of other cardiac implants and grafts: Secondary | ICD-10-CM

## 2017-01-21 DIAGNOSIS — R6520 Severe sepsis without septic shock: Secondary | ICD-10-CM | POA: Diagnosis not present

## 2017-01-21 DIAGNOSIS — F4323 Adjustment disorder with mixed anxiety and depressed mood: Secondary | ICD-10-CM | POA: Diagnosis present

## 2017-01-21 DIAGNOSIS — Z86010 Personal history of colonic polyps: Secondary | ICD-10-CM

## 2017-01-21 DIAGNOSIS — R579 Shock, unspecified: Secondary | ICD-10-CM | POA: Diagnosis not present

## 2017-01-21 DIAGNOSIS — I8392 Asymptomatic varicose veins of left lower extremity: Secondary | ICD-10-CM | POA: Diagnosis present

## 2017-01-21 HISTORY — DX: Pressure ulcer of sacral region, unspecified stage: L89.159

## 2017-01-21 LAB — LIPASE, BLOOD: LIPASE: 46 U/L (ref 11–51)

## 2017-01-21 LAB — CBC WITH DIFFERENTIAL/PLATELET
BASOS ABS: 0 10*3/uL (ref 0–0.1)
BASOS PCT: 0 %
EOS PCT: 0 %
Eosinophils Absolute: 0 10*3/uL (ref 0–0.7)
HCT: 39.2 % — ABNORMAL LOW (ref 40.0–52.0)
Hemoglobin: 13.1 g/dL (ref 13.0–18.0)
Lymphocytes Relative: 8 %
Lymphs Abs: 0.7 10*3/uL — ABNORMAL LOW (ref 1.0–3.6)
MCH: 34 pg (ref 26.0–34.0)
MCHC: 33.4 g/dL (ref 32.0–36.0)
MCV: 101.8 fL — ABNORMAL HIGH (ref 80.0–100.0)
MONO ABS: 0.5 10*3/uL (ref 0.2–1.0)
Monocytes Relative: 6 %
Neutro Abs: 6.8 10*3/uL — ABNORMAL HIGH (ref 1.4–6.5)
Neutrophils Relative %: 86 %
PLATELETS: 122 10*3/uL — AB (ref 150–440)
RBC: 3.85 MIL/uL — ABNORMAL LOW (ref 4.40–5.90)
RDW: 16.6 % — AB (ref 11.5–14.5)
WBC: 8 10*3/uL (ref 3.8–10.6)

## 2017-01-21 LAB — URINALYSIS, COMPLETE (UACMP) WITH MICROSCOPIC
Bilirubin Urine: NEGATIVE
Glucose, UA: 50 mg/dL — AB
KETONES UR: 5 mg/dL — AB
Nitrite: NEGATIVE
PROTEIN: 30 mg/dL — AB
Specific Gravity, Urine: 1.025 (ref 1.005–1.030)
pH: 5 (ref 5.0–8.0)

## 2017-01-21 LAB — COMPREHENSIVE METABOLIC PANEL
ALT: 17 U/L (ref 17–63)
AST: 31 U/L (ref 15–41)
Albumin: 2 g/dL — ABNORMAL LOW (ref 3.5–5.0)
Alkaline Phosphatase: 102 U/L (ref 38–126)
Anion gap: 12 (ref 5–15)
BUN: 43 mg/dL — AB (ref 6–20)
CHLORIDE: 99 mmol/L — AB (ref 101–111)
CO2: 27 mmol/L (ref 22–32)
CREATININE: 6.14 mg/dL — AB (ref 0.61–1.24)
Calcium: 8.5 mg/dL — ABNORMAL LOW (ref 8.9–10.3)
GFR calc Af Amer: 11 mL/min — ABNORMAL LOW (ref >60)
GFR, EST NON AFRICAN AMERICAN: 9 mL/min — AB (ref >60)
Glucose, Bld: 100 mg/dL — ABNORMAL HIGH (ref 65–99)
Potassium: 4.5 mmol/L (ref 3.5–5.1)
Sodium: 138 mmol/L (ref 135–145)
Total Bilirubin: 1.1 mg/dL (ref 0.3–1.2)
Total Protein: 6.5 g/dL (ref 6.5–8.1)

## 2017-01-21 LAB — GASTROINTESTINAL PANEL BY PCR, STOOL (REPLACES STOOL CULTURE)
ASTROVIRUS: NOT DETECTED
Adenovirus F40/41: NOT DETECTED
Campylobacter species: NOT DETECTED
Cryptosporidium: NOT DETECTED
Cyclospora cayetanensis: NOT DETECTED
ENTAMOEBA HISTOLYTICA: NOT DETECTED
ENTEROAGGREGATIVE E COLI (EAEC): NOT DETECTED
ENTEROTOXIGENIC E COLI (ETEC): NOT DETECTED
Enteropathogenic E coli (EPEC): NOT DETECTED
GIARDIA LAMBLIA: NOT DETECTED
NOROVIRUS GI/GII: NOT DETECTED
Plesimonas shigelloides: NOT DETECTED
Rotavirus A: NOT DETECTED
SALMONELLA SPECIES: NOT DETECTED
Sapovirus (I, II, IV, and V): NOT DETECTED
Shiga like toxin producing E coli (STEC): NOT DETECTED
Shigella/Enteroinvasive E coli (EIEC): NOT DETECTED
VIBRIO CHOLERAE: NOT DETECTED
VIBRIO SPECIES: NOT DETECTED
Yersinia enterocolitica: NOT DETECTED

## 2017-01-21 LAB — RENAL FUNCTION PANEL
ALBUMIN: 1.8 g/dL — AB (ref 3.5–5.0)
ANION GAP: 11 (ref 5–15)
Albumin: 1.9 g/dL — ABNORMAL LOW (ref 3.5–5.0)
Anion gap: 10 (ref 5–15)
BUN: 43 mg/dL — AB (ref 6–20)
BUN: 42 mg/dL — ABNORMAL HIGH (ref 6–20)
CALCIUM: 7.8 mg/dL — AB (ref 8.9–10.3)
CALCIUM: 7.8 mg/dL — AB (ref 8.9–10.3)
CO2: 24 mmol/L (ref 22–32)
CO2: 26 mmol/L (ref 22–32)
Chloride: 98 mmol/L — ABNORMAL LOW (ref 101–111)
Chloride: 100 mmol/L — ABNORMAL LOW (ref 101–111)
Creatinine, Ser: 5.85 mg/dL — ABNORMAL HIGH (ref 0.61–1.24)
Creatinine, Ser: 5.64 mg/dL — ABNORMAL HIGH (ref 0.61–1.24)
GFR calc Af Amer: 11 mL/min — ABNORMAL LOW (ref >60)
GFR calc non Af Amer: 10 mL/min — ABNORMAL LOW (ref >60)
GFR, EST AFRICAN AMERICAN: 12 mL/min — AB (ref >60)
GFR, EST NON AFRICAN AMERICAN: 10 mL/min — AB (ref >60)
GLUCOSE: 144 mg/dL — AB (ref 65–99)
Glucose, Bld: 140 mg/dL — ABNORMAL HIGH (ref 65–99)
PHOSPHORUS: 8.3 mg/dL — AB (ref 2.5–4.6)
PHOSPHORUS: 7.8 mg/dL — AB (ref 2.5–4.6)
POTASSIUM: 4.1 mmol/L (ref 3.5–5.1)
Potassium: 4.2 mmol/L (ref 3.5–5.1)
SODIUM: 133 mmol/L — AB (ref 135–145)
Sodium: 136 mmol/L (ref 135–145)

## 2017-01-21 LAB — TROPONIN I
TROPONIN I: 0.06 ng/mL — AB (ref <0.03)
TROPONIN I: 0.03 ng/mL — AB (ref <0.03)
TROPONIN I: 0.04 ng/mL — AB (ref <0.03)
Troponin I: 0.04 ng/mL (ref <0.03)

## 2017-01-21 LAB — INFLUENZA PANEL BY PCR (TYPE A & B)
INFLAPCR: NEGATIVE
INFLBPCR: NEGATIVE

## 2017-01-21 LAB — C DIFFICILE QUICK SCREEN W PCR REFLEX
C DIFFICILE (CDIFF) TOXIN: NEGATIVE
C DIFFICLE (CDIFF) ANTIGEN: NEGATIVE
C Diff interpretation: NOT DETECTED

## 2017-01-21 LAB — PROCALCITONIN: PROCALCITONIN: 0.27 ng/mL

## 2017-01-21 LAB — LACTIC ACID, PLASMA
LACTIC ACID, VENOUS: 2.2 mmol/L — AB (ref 0.5–1.9)
Lactic Acid, Venous: 2.4 mmol/L (ref 0.5–1.9)

## 2017-01-21 LAB — MRSA PCR SCREENING: MRSA by PCR: NEGATIVE

## 2017-01-21 LAB — PROTIME-INR
INR: 1.33
PROTHROMBIN TIME: 16.6 s — AB (ref 11.4–15.2)

## 2017-01-21 LAB — AST: AST: 29 U/L (ref 15–41)

## 2017-01-21 LAB — ALT: ALT: 17 U/L (ref 17–63)

## 2017-01-21 LAB — MAGNESIUM
MAGNESIUM: 1.8 mg/dL (ref 1.7–2.4)
MAGNESIUM: 1.9 mg/dL (ref 1.7–2.4)

## 2017-01-21 LAB — GLUCOSE, CAPILLARY: Glucose-Capillary: 98 mg/dL (ref 65–99)

## 2017-01-21 MED ORDER — SODIUM CHLORIDE 0.9 % IV BOLUS (SEPSIS)
1000.0000 mL | Freq: Once | INTRAVENOUS | Status: AC
  Administered 2017-01-21: 1000 mL via INTRAVENOUS

## 2017-01-21 MED ORDER — FLUCONAZOLE IN SODIUM CHLORIDE 400-0.9 MG/200ML-% IV SOLN
400.0000 mg | INTRAVENOUS | Status: DC
  Administered 2017-01-22 – 2017-01-24 (×3): 400 mg via INTRAVENOUS
  Filled 2017-01-21 (×4): qty 200

## 2017-01-21 MED ORDER — CHLORHEXIDINE GLUCONATE CLOTH 2 % EX PADS
6.0000 | MEDICATED_PAD | Freq: Every day | CUTANEOUS | Status: DC
  Administered 2017-01-22 – 2017-01-23 (×2): 6 via TOPICAL

## 2017-01-21 MED ORDER — MUPIROCIN 2 % EX OINT
1.0000 "application " | TOPICAL_OINTMENT | Freq: Two times a day (BID) | CUTANEOUS | Status: DC
  Administered 2017-01-21 – 2017-01-23 (×4): 1 via NASAL
  Filled 2017-01-21: qty 22

## 2017-01-21 MED ORDER — FLUCONAZOLE IN SODIUM CHLORIDE 400-0.9 MG/200ML-% IV SOLN
800.0000 mg | Freq: Once | INTRAVENOUS | Status: AC
  Administered 2017-01-21: 800 mg via INTRAVENOUS
  Filled 2017-01-21: qty 400

## 2017-01-21 MED ORDER — CEFEPIME-DEXTROSE 1 GM/50ML IV SOLR: 1.0000 g | INTRAVENOUS | Status: DC

## 2017-01-21 MED ORDER — FOLIC ACID 5 MG/ML IJ SOLN
1.0000 mg | Freq: Every day | INTRAMUSCULAR | Status: DC
  Administered 2017-01-21 – 2017-01-25 (×5): 1 mg via INTRAVENOUS
  Filled 2017-01-21 (×5): qty 0.2

## 2017-01-21 MED ORDER — MIDODRINE HCL 5 MG PO TABS
5.0000 mg | ORAL_TABLET | Freq: Three times a day (TID) | ORAL | Status: DC
  Administered 2017-01-21: 5 mg via ORAL
  Filled 2017-01-21 (×2): qty 1

## 2017-01-21 MED ORDER — HEPARIN SODIUM (PORCINE) 5000 UNIT/ML IJ SOLN: 5000.0000 [IU] | Freq: Three times a day (TID) | INTRAMUSCULAR | Status: DC

## 2017-01-21 MED ORDER — ONDANSETRON HCL 4 MG/2ML IJ SOLN
4.0000 mg | Freq: Four times a day (QID) | INTRAMUSCULAR | Status: DC | PRN
  Administered 2017-01-21 – 2017-01-22 (×3): 4 mg via INTRAVENOUS
  Filled 2017-01-21 (×3): qty 2

## 2017-01-21 MED ORDER — PIPERACILLIN-TAZOBACTAM 3.375 G IVPB 30 MIN
3.3750 g | Freq: Once | INTRAVENOUS | Status: AC
  Administered 2017-01-21: 3.375 g via INTRAVENOUS
  Filled 2017-01-21: qty 50

## 2017-01-21 MED ORDER — NOREPINEPHRINE BITARTRATE 1 MG/ML IV SOLN
0.0000 ug/min | Freq: Once | INTRAVENOUS | Status: AC
  Administered 2017-01-21: 2 ug/min via INTRAVENOUS
  Filled 2017-01-21: qty 4

## 2017-01-21 MED ORDER — ASPIRIN 81 MG PO CHEW: 324.0000 mg | CHEWABLE_TABLET | ORAL | Status: DC

## 2017-01-21 MED ORDER — DEXTROSE 5 % IV SOLN: 2.0000 g | INTRAVENOUS | Status: DC

## 2017-01-21 MED ORDER — CEFTRIAXONE SODIUM-DEXTROSE 2-2.22 GM-% IV SOLR
2.0000 g | INTRAVENOUS | Status: DC
  Administered 2017-01-22 – 2017-01-26 (×6): 2 g via INTRAVENOUS
  Filled 2017-01-21 (×9): qty 50

## 2017-01-21 MED ORDER — ACETAMINOPHEN 325 MG PO TABS: 650.0000 mg | ORAL_TABLET | ORAL | Status: DC | PRN

## 2017-01-21 MED ORDER — ORAL CARE MOUTH RINSE
15.0000 mL | Freq: Two times a day (BID) | OROMUCOSAL | Status: DC
  Administered 2017-01-21: 15 mL via OROMUCOSAL

## 2017-01-21 MED ORDER — CITALOPRAM HYDROBROMIDE 20 MG PO TABS
10.0000 mg | ORAL_TABLET | Freq: Every day | ORAL | Status: DC
  Filled 2017-01-21: qty 2
  Filled 2017-01-21: qty 1

## 2017-01-21 MED ORDER — METRONIDAZOLE 500 MG PO TABS
500.0000 mg | ORAL_TABLET | Freq: Three times a day (TID) | ORAL | Status: DC
  Administered 2017-01-21 (×2): 500 mg via ORAL
  Filled 2017-01-21 (×2): qty 1

## 2017-01-21 MED ORDER — HEPARIN SODIUM (PORCINE) 1000 UNIT/ML DIALYSIS
1000.0000 [IU] | INTRAMUSCULAR | Status: DC | PRN
  Filled 2017-01-21 (×2): qty 10

## 2017-01-21 MED ORDER — DEXTROSE 5 % IV SOLN
0.0000 ug/min | INTRAVENOUS | Status: DC
  Administered 2017-01-21: 10 ug/min via INTRAVENOUS
  Administered 2017-01-21: 7 ug/min via INTRAVENOUS
  Administered 2017-01-22 (×2): 12 ug/min via INTRAVENOUS
  Administered 2017-01-22: 10 ug/min via INTRAVENOUS
  Administered 2017-01-23: 13 ug/min via INTRAVENOUS
  Administered 2017-01-23 (×2): 12 ug/min via INTRAVENOUS
  Administered 2017-01-24: 7 ug/min via INTRAVENOUS
  Filled 2017-01-21 (×10): qty 4

## 2017-01-21 MED ORDER — VANCOMYCIN HCL IN DEXTROSE 1-5 GM/200ML-% IV SOLN
1000.0000 mg | Freq: Once | INTRAVENOUS | Status: AC
  Administered 2017-01-21: 1000 mg via INTRAVENOUS
  Filled 2017-01-21: qty 200

## 2017-01-21 MED ORDER — CEFTRIAXONE SODIUM 2 G IJ SOLR: 2.0000 g | INTRAMUSCULAR | Status: DC

## 2017-01-21 MED ORDER — ATROPINE SULFATE 1 MG/ML IJ SOLN
0.5000 mg | Freq: Once | INTRAMUSCULAR | Status: DC
  Filled 2017-01-21: qty 0.5

## 2017-01-21 MED ORDER — SODIUM CHLORIDE 0.9 % IV SOLN
250.0000 mL | INTRAVENOUS | Status: DC | PRN
Start: 2017-01-21 — End: 2017-02-09
  Administered 2017-01-25 – 2017-02-01 (×2): 250 mL via INTRAVENOUS

## 2017-01-21 MED ORDER — ATROPINE SULFATE 1 MG/10ML IJ SOSY
PREFILLED_SYRINGE | INTRAMUSCULAR | Status: AC
  Administered 2017-01-21: 0.5 mg
  Filled 2017-01-21: qty 10

## 2017-01-21 MED ORDER — PUREFLOW DIALYSIS SOLUTION
INTRAVENOUS | Status: DC
  Administered 2017-01-21 – 2017-01-23 (×5): via INTRAVENOUS_CENTRAL

## 2017-01-21 MED ORDER — IPRATROPIUM-ALBUTEROL 0.5-2.5 (3) MG/3ML IN SOLN: 3.0000 mL | Freq: Four times a day (QID) | RESPIRATORY_TRACT | Status: DC | PRN

## 2017-01-21 MED ORDER — ASPIRIN 300 MG RE SUPP: 300.0000 mg | RECTAL | Status: DC

## 2017-01-21 NOTE — H&P (Signed)
PULMONARY / CRITICAL CARE MEDICINE   Name: [name] MRN: [id number] DOB: [birth year]    ADMISSION DATE:  01/26/2017   Summary:  Infected stage 3-4 sacral decubitus ulcer, and acute renal failure.  He was discharged to [facility] on 02/2 on IV antibiotic Ceftriaxone and oral Flagyl to infuse via a right upper arm PICC placed on 01/04/17.  Now returns with severe septic shock, hypotension, oliguria.    ASSESSMENT / PLAN:  PULMONARY A: Acute respiratory failure secondary to pulmonary vascular congestion CXR image reviewed; LLL atelectasis.  P:   Supplemental O2 to maintain O2 sats >92% Prn Bronchodilator therapy CXR in am   CARDIOVASCULAR A:  Septic shock secondary to infected sacral decubitus ulcer; with hypotension.  Atrial Fibrillation-rate controlled Mildly elevated troponin's likely secondary to demand ischemia  Hx: HTN, Chronic diastolic CHF, and Heart Murmur P:  Continuous telemetry monitoring Trend troponin's Prn levophed gtt to maintain map >60 Hold outpatient benazepril, lasix, sotalol, and spironolactone Continue outpatient midodrine  --Continue IVF.   RENAL A:   Acute renal failure with oliguria  P:   Trend BMP's Replace electrolytes as indicated Monitor UOP  Nephrology consulted appreciate input CRRT per nephrology recommendations Renal US pending Avoid nephrotoxic medications  GASTROINTESTINAL A:   Cirrhosis with continued drinking and smoking.  Hx: Anal Fissure P:   Renal diet with 1200 ml fluid restriction  HEMATOLOGIC A:   Thrombocytopenia  P:  SCD's for VTE prophylaxis Check PT/INR Trend CBC Monitor for s/sx of bleeding  INFECTIOUS A:   Septic shock secondary to Infected sacral decubitus ulcer P:   Trend WBC's and monitor fever curve Trend PCT's and lactic acid  Continue abx as listed above ID consulted appreciate input Wound consult appreciate input May need surgical consult for debridement of pressure ulcer at  some point  CULTURES: Urine 02/9>> GI Panel 02/9>>negative  Cdiff 02/9>>negative Blood x2 02/9>>  ANTIBIOTICS: Vancomycin 02/9>>x1 dose Zosyn 02/9>>x1 dose  Ceftriaxone 02/9>> Metronidazole 02/9>>  ENDOCRINE A:   No acute issues P:   Monitor serum glucose Hyper/Hypoglycemic protocol  NEUROLOGIC A:   No acute issues  P:   If pt develops acute pain will order prn morphine    -------------------------------------------------------------------------------    CHIEF COMPLAINT:  Hypotension, Nausea, and Sacral Decub Infection  HISTORY OF PRESENT ILLNESS:   This is a 56 yo male with a PMH of Colonic Polyps, Lymphadenopathy, HTN, Heart Murmur, Chronic Atrial fibrillation, Chronic diastolic CHF, Depression, Alcoholic Liver Cirrhosis with ascites, Chronic hypotension, and  Infected stage 3-4 sacral decubitus ulcer s/p debridement.  He presented to [facility] ER 02/9 with hypotension, nausea, and vomiting.  Per ER notes and pts wife the pt was recently discharged from the hospital due to hypovolemia/septic shock secondary to a bleeding and infected stage 3-4 sacral decubitus ulcer, and acute renal failure.  He was discharged to [facility] on 02/2 on IV antibiotic Ceftriaxone and oral Flagyl to infuse via a right upper arm PICC placed on 01/04/17.  According to pts wife she has been concerned about pts low urinary output and significant weight loss since December he previously weighed 343 lbs current weight is 290 lbs.  According to pts wife pt required a paracentesis due to ascites last admission with removal of 13.8L of yellow fluid on 01/03/17. Upon arrival to ER today 02/9 it was noted the pt was hypotensive with bp 52/25 with lactic acidosis ruling in for septic shock and acute renal failure with creatinine 6.14, therefore pt received 4L  NS boluses and started on a Levophed gtt. PCCM contacted to admit pt to ICU with septic shock secondary to infected sacral decubitus ulcer requiring  pressors, lactic acidosis and acute renal failure with oliguria.  PAST MEDICAL HISTORY :  He  has a past medical history of Anal fissure; Atrial fibrillation (HCC); CHF (congestive heart failure) (HCC); Heart murmur; Hypertension; Lymphadenopathy; Personal history of colonic polyps; and Pressure ulcer, sacrum.  PAST SURGICAL HISTORY: He  has a past surgical history that includes Hernia repair (2012); Colonoscopy (2012); larynx-amyloidosis-laser surgery  (2010); and Esophagogastroduodenoscopy (egd) with propofol (N/A, 12/18/2016).  Allergies  Allergen Reactions  . Clindamycin/Lincomycin Diarrhea    No current facility-administered medications on file prior to encounter.    Current Outpatient Prescriptions on File Prior to Encounter  Medication Sig  . benazepril (LOTENSIN) 40 MG tablet Take 1 tablet (40 mg total) by mouth daily.  . cefTRIAXone 2 g in dextrose 5 % 50 mL Inject 2 g into the vein daily.  . citalopram (CELEXA) 10 MG tablet Take 1 tablet (10 mg total) by mouth at bedtime.  . feeding supplement, ENSURE ENLIVE, (ENSURE ENLIVE) LIQD Take 237 mLs by mouth 2 (two) times daily with a meal.  . folic acid (FOLVITE) 1 MG tablet Take 1 tablet (1 mg total) by mouth daily.  . furosemide (LASIX) 40 MG tablet Take 0.5 tablets (20 mg total) by mouth daily.  . metroNIDAZOLE (FLAGYL) 500 MG tablet Take 1 tablet (500 mg total) by mouth 3 (three) times daily. (Patient taking differently: Take 500 mg by mouth 3 (three) times daily. 1610,9604,5409)  . midodrine (PROAMATINE) 5 MG tablet Take 1 tablet (5 mg total) by mouth 3 (three) times daily with meals. (Patient taking differently: Take 5 mg by mouth 3 (three) times daily with meals. 8119,1478,2956)  . naproxen sodium (ALEVE) 220 MG tablet Take 440 mg by mouth 2 (two) times daily with a meal.  . oxyCODONE (OXY IR/ROXICODONE) 5 MG immediate release tablet Take 1 tablet (5 mg total) by mouth every 6 (six) hours as needed for moderate pain or severe pain.   . potassium chloride (K-DUR,KLOR-CON) 10 MEQ tablet Take 10 mEq by mouth daily.   . sotalol (BETAPACE) 80 MG tablet Take 1 tablet (80 mg total) by mouth 2 (two) times daily. (Patient taking differently: Take 80 mg by mouth 2 (two) times daily. 0800,1600)  . spironolactone (ALDACTONE) 25 MG tablet Take 0.5 tablets (12.5 mg total) by mouth daily.    FAMILY HISTORY:  His indicated that the status of his brother is unknown. He indicated that the status of his other is unknown.    SOCIAL HISTORY: He  reports that he has been smoking.  He has a 20.00 pack-year smoking history. He has never used smokeless tobacco. He reports that he drinks alcohol. He reports that he does not use drugs.   SUBJECTIVE:  Pt states he is mildly short of breath and continuing to have bilateral lower extremity edema.  REVIEW OF SYSTEMS:  Positives in BOLD Gen: Denies fever, chills, weight change, fatigue, night sweats HEENT: Denies blurred vision, double vision, hearing loss, tinnitus, sinus congestion, rhinorrhea, sore throat, neck stiffness, dysphagia PULM: shortness of breath, cough, sputum production, hemoptysis, wheezing CV: chest pain, edema, orthopnea, paroxysmal nocturnal dyspnea, palpitations GI: abdominal pain, nausea, vomiting, diarrhea, hematochezia, melena, constipation, change in bowel habits GU: dysuria, hematuria, polyuria, oliguria, urethral discharge Endocrine: Denies hot or cold intolerance, polyuria, polyphagia or appetite change Derm: Denies rash, dry skin, scaling or  peeling skin change Heme: Denies easy bruising, bleeding, bleeding gums Neuro: Denies headache, numbness, weakness, slurred speech, loss of memory or consciousness    VITAL SIGNS: BP 90/72   Pulse (!) 59   Temp (!) 95.3 F (35.2 C) (Rectal)   Resp 12   Ht 6\' 4"  (1.93 m)   Wt 131.5 kg (290 lb)   SpO2 100%   BMI 35.30 kg/m   HEMODYNAMICS:    VENTILATOR SETTINGS:    INTAKE / OUTPUT: No intake/output data  recorded.  PHYSICAL EXAMINATION: General: acutely ill appearing Caucasian male in no acute distress  Neuro: alert and oriented, follows commands, PERRLA HEENT: supple, no JVD Cardiovascular: irregular, irregular, no M/R/G Lungs: faint bilateral crackles at bases no wheezes, rhonchi, or rales present, even, non labored  Abdomen: hypoactive BS x4, soft, non tender, distended Musculoskeletal: moves all extremities, 2+ bilateral lower extremity edema  Skin: tunneled sacral decubitus ulcer with unknown depth measuring 51/2 x 6 with erythema wound edges excoriated, no rashes   LABS:  BMET  Recent Labs Lab 02/01/2017 1027  NA 138  K 4.5  CL 99*  CO2 27  BUN 43*  CREATININE 6.14*  GLUCOSE 100*    Electrolytes  Recent Labs Lab 01/26/2017 1027  CALCIUM 8.5*    CBC  Recent Labs Lab 01/30/2017 1027  WBC 8.0  HGB 13.1  HCT 39.2*  PLT 122*    Coag's No results for input(s): APTT, INR in the last 168 hours.  Sepsis Markers  Recent Labs Lab 02/05/2017 1027  LATICACIDVEN 2.2*    ABG No results for input(s): PHART, PCO2ART, PO2ART in the last 168 hours.  Liver Enzymes  Recent Labs Lab 01/31/2017 1027  AST 31  ALT 17  ALKPHOS 102  BILITOT 1.1  ALBUMIN 2.0*    Cardiac Enzymes  Recent Labs Lab 01/28/2017 1027  TROPONINI 0.06*    Glucose No results for input(s): GLUCAP in the last 168 hours.  Imaging Dg Chest 1 View  Result Date: 01/28/2017 CLINICAL DATA:  Chest pain and vomiting. EXAM: CHEST 1 VIEW COMPARISON:  One-view chest x-ray 12/27/2016 FINDINGS: The heart size is exaggerate by low lung volumes. Moderate pulmonary vascular congestion is present. New left basilar airspace disease is present. The tip of a right-sided PICC line terminates at the cavoatrial junction. IMPRESSION: 1. Borderline cardiomegaly with increasing pulmonary vascular congestion. 2. New left basilar airspace disease likely reflects atelectasis. Infection is not excluded. Te Electronically  Signed   By: Marin Robertshristopher  Mattern M.D.   On: 01/31/2017 10:55   STUDIES: Renal US 02/9>>  SIGNIFICANT EVENTS: 02/9-Pt admitted to [facility] ICU with septic shock secondary to infected sacral decubitus ulcer requiring pressors, lactic acidosis, acute renal failure PCCM contacted to admit pt   LINES/TUBES: Right upper arm PICC 01/23>>  FAMILY  - Updates: Pts wife updated about plan of care and questions answered 01/21/2017  - Inter-disciplinary family meet or Palliative Care meeting due by: 01/28/17   Sonda Rumbleana Blakeney, Juel BurrowAGNP  Pulmonary/Critical Care Pager [phone] (please enter 7 digits) PCCM Consult Pager [phone] (please enter 7 digits)  Pt seen and examined, above note reflects my assessment and plan. Infected stage 3-4 sacral decubitus ulcer, and acute renal failure.  He was discharged to [facility] on 02/2 on IV antibiotic Ceftriaxone and oral Flagyl to infuse via a right upper arm PICC placed on 01/04/17.  Now returns with severe septic shock, hypotension, oliguria. Lungs CTA, abdomen distended, NT.   Continue IVF, levophed, empiric abx for decub ulcers  and septic shock. D/w ID. D/w nephro, proceed with CRRT per nephro recs.   Wells Guiles, M.D.  01/17/2017   Critical Care Attestation.  I have personally obtained a history, examined the patient, evaluated laboratory and imaging results, formulated the assessment and plan and placed orders. The Patient requires high complexity decision making for assessment and support, frequent evaluation and titration of therapies, application of advanced monitoring technologies and extensive interpretation of multiple databases. The patient has critical illness that could lead imminently to failure of 1 or more organ systems and requires the highest level of physician preparedness to intervene.  Critical Care Time devoted to patient care services described in this note is 45 minutes and is exclusive of time spent in  procedures.

## 2017-01-21 NOTE — Progress Notes (Signed)
New orders given to notify NP, if HR drops below 30 bpm and sustains, or is BP drops. Will continue to assess.

## 2017-01-21 NOTE — ED Triage Notes (Addendum)
Pt arrived via EMS from [facility] wit reports of hypotension, abdominal distention and pressure ulcer to sacral area that is new over the past 5 weeks. [facility] Friday to [facility].  Pt has had low blood pressure recently and heart rate in 40s and 50s.  Hx of etoh use but none in the past 4-5 months.  Pt nauseated today and [facility] arrival. Pt has right upper arm PICC present placed over a week ago during last hospital admission. Pt has had multiple paracentesis in the past had to drain 13L off 2 weeks ago.

## 2017-01-21 NOTE — Progress Notes (Signed)
Pharmacy Antibiotic Note  [name] is a 56 y.o. male admitted on 01/26/2017 with sepsis secondary to sacral decubitus ulcer.  Pharmacy has been consulted for fluconazole dosing. Patient to be started on CRRT. Patient discharged to [facility] on 2/2 on ceftriaxone and metronidazole. These have been continued in house.   Plan: If patient started on CRRT will start fluconazole 800mg  IV x 1 followed by fluconazole 400mg  IV Q24hr. Instructed nurse to speak with pharmacy for alternate recommendations if CRRT not initiated.   Height: 6\' 4"  (193 cm) Weight: 294 lb 15.6 oz (133.8 kg) IBW/kg (Calculated) : 86.8  Temp (24hrs), Avg:96 F (35.6 C), Min:95.3 F (35.2 C), Max:97.6 F (36.4 C)   Recent Labs Lab 05/25/2017 1027 05/25/2017 1521  WBC 8.0  --   CREATININE 6.14*  --   LATICACIDVEN 2.2* 2.4*    Estimated Creatinine Clearance: 20.1 mL/min (by C-G formula based on SCr of 6.14 mg/dL (H)).    Allergies  Allergen Reactions  . Clindamycin/Lincomycin Diarrhea    Antimicrobials this admission: Ceftriaxone  Metronidazole  Fluconazole.   Dose adjustments this admission: N/A  Microbiology results: Pending.   Pharmacy will continue to monitor and adjust per consult.   Simpson,Janis L 01/22/2017 5:07 PM

## 2017-01-21 NOTE — ED Notes (Signed)
Second set of BC drawn from PICC line, both caps changed and flushed with 10cc of NS.

## 2017-01-21 NOTE — Consult Note (Signed)
[facility] Infectious Disease     Reason for Consult:sepsis    Referring Physician: Felicie Morn Date of Admission:  01/20/2017   Active Problems:   Sepsis [facility])   HPI: [name] is a 56 y.o. male admitted with ARF, following 1-2 days of NV.  He has a severe sacral decub and was recently   dced 2.9 on ctx and flagyl. He on admit had hypotension and hypthermia.   Past Medical History:  Diagnosis Date  . Anal fissure   . Atrial fibrillation ([place])   . CHF (congestive heart failure) ([place])   . Heart murmur   . Hypertension   . Lymphadenopathy   . Personal history of colonic polyps   . Pressure ulcer, sacrum    Past Surgical History:  Procedure Laterality Date  . COLONOSCOPY  2012  . ESOPHAGOGASTRODUODENOSCOPY (EGD) WITH PROPOFOL N/A 12/18/2016   Procedure: ESOPHAGOGASTRODUODENOSCOPY (EGD) WITH PROPOFOL;  Surgeon: Lin Landsman, MD;  Location: [facility] ENDOSCOPY;  Service: Endoscopy;  Laterality: N/A;  . HERNIA REPAIR  2012  . larynx-amyloidosis-laser surgery   2010   Social History  Substance Use Topics  . Smoking status: Current Every Day Smoker    Packs/day: 1.00    Years: 20.00  . Smokeless tobacco: Never Used  . Alcohol use Yes   Family History  Problem Relation Age of Onset  . Hypertension Other   . Hypertension Brother     Allergies:  Allergies  Allergen Reactions  . Clindamycin/Lincomycin Diarrhea    Current antibiotics: Antibiotics Given (last 72 hours)    None      MEDICATIONS: . atropine  0.5 mg Intravenous Once  . cefTRIAXone  2 g Intravenous Q24H  . citalopram  10 mg Oral QHS  . folic acid  1 mg Intravenous Daily  . metroNIDAZOLE  500 mg Oral TID  . midodrine  5 mg Oral TID WC  . sodium chloride  1,000 mL Intravenous Once    Review of Systems - 11 systems reviewed and negative per HPI   OBJECTIVE: Temp:  [95.3 F (35.2 C)-96 F (35.6 C)] 95.3 F (35.2 C) (02/09 1320) Pulse Rate:  [44-66] 62 (02/09 1518) Resp:  [9-18] 14  (02/09 1518) BP: (51-103)/(25-83) 92/79 (02/09 1518) SpO2:  [90 %-100 %] 95 % (02/09 1518) Weight:  [131.5 kg (290 lb)] 131.5 kg (290 lb) (02/09 1024) Physical Exam  Constitutional: slowed mentation , and time.morbidly obese HENT: anicteric Mouth/Throat: Oropharynx is clear and moist. No oropharyngeal exudate.  Cardiovascular: Normal rate, regular rhythm and normal heart sounds.  Pulmonary/Chest: Effort normal and breath sounds normal. No respiratory distress. He has no wheezes.  Abdominal: Soft.distended  Bowel sounds are normal. He exhibits no distension. There is no tenderness.  Lymphadenopathy: He has no cervical adenopathy.  Neurological: He is alert and oriented to person, place, and time.  Skin: very large and deep sacral decub but it is clean and no odor or drainage. Does have significant surrounding fungal rash  Psychiatric: He has a normal mood and affect. His behavior is normal.     LABS: Results for orders placed or performed during the hospital encounter of 02/06/2017 (from the past 48 hour(s))  CBC with Differential     Status: Abnormal   Collection Time: 01/26/2017 10:27 AM  Result Value Ref Range   WBC 8.0 3.8 - 10.6 K/uL   RBC 3.85 (L) 4.40 - 5.90 MIL/uL   Hemoglobin 13.1 13.0 - 18.0 g/dL   HCT 39.2 (L) 40.0 - 52.0 %  MCV 101.8 (H) 80.0 - 100.0 fL   MCH 34.0 26.0 - 34.0 pg   MCHC 33.4 32.0 - 36.0 g/dL   RDW 16.6 (H) 11.5 - 14.5 %   Platelets 122 (L) 150 - 440 K/uL   Neutrophils Relative % 86 %   Neutro Abs 6.8 (H) 1.4 - 6.5 K/uL   Lymphocytes Relative 8 %   Lymphs Abs 0.7 (L) 1.0 - 3.6 K/uL   Monocytes Relative 6 %   Monocytes Absolute 0.5 0.2 - 1.0 K/uL   Eosinophils Relative 0 %   Eosinophils Absolute 0.0 0 - 0.7 K/uL   Basophils Relative 0 %   Basophils Absolute 0.0 0 - 0.1 K/uL  Comprehensive metabolic panel     Status: Abnormal   Collection Time: 02/05/2017 10:27 AM  Result Value Ref Range   Sodium 138 135 - 145 mmol/L   Potassium 4.5 3.5 - 5.1 mmol/L    Chloride 99 (L) 101 - 111 mmol/L   CO2 27 22 - 32 mmol/L   Glucose, Bld 100 (H) 65 - 99 mg/dL   BUN 43 (H) 6 - 20 mg/dL   Creatinine, Ser 6.14 (H) 0.61 - 1.24 mg/dL   Calcium 8.5 (L) 8.9 - 10.3 mg/dL   Total Protein 6.5 6.5 - 8.1 g/dL   Albumin 2.0 (L) 3.5 - 5.0 g/dL   AST 31 15 - 41 U/L   ALT 17 17 - 63 U/L   Alkaline Phosphatase 102 38 - 126 U/L   Total Bilirubin 1.1 0.3 - 1.2 mg/dL   GFR calc non Af Amer 9 (L) >60 mL/min   GFR calc Af Amer 11 (L) >60 mL/min    Comment: (NOTE) The eGFR has been calculated using the CKD EPI equation. This calculation has not been validated in all clinical situations. eGFR's persistently <60 mL/min signify possible Chronic Kidney Disease.    Anion gap 12 5 - 15  Lipase, blood     Status: None   Collection Time: 01/29/2017 10:27 AM  Result Value Ref Range   Lipase 46 11 - 51 U/L  Troponin I     Status: Abnormal   Collection Time: 02/04/2017 10:27 AM  Result Value Ref Range   Troponin I 0.06 (HH) <0.03 ng/mL    Comment: CRITICAL RESULT CALLED TO, READ BACK BY AND VERIFIED WITH ASHLEY ROSS ON 02/03/2017 AT 1123 MNS   Lactic acid, plasma     Status: Abnormal   Collection Time: 02/08/2017 10:27 AM  Result Value Ref Range   Lactic Acid, Venous 2.2 (HH) 0.5 - 1.9 mmol/L    Comment: CRITICAL RESULT CALLED TO, READ BACK BY AND VERIFIED WITH BRANDY DAVIS AT 1149 02/05/2017 DAS   C difficile quick scan w PCR reflex     Status: None   Collection Time: 01/29/2017 11:28 AM  Result Value Ref Range   C Diff antigen NEGATIVE NEGATIVE   C Diff toxin NEGATIVE NEGATIVE   C Diff interpretation No C. difficile detected.   Gastrointestinal Panel by PCR , Stool     Status: None   Collection Time: 01/18/2017 11:28 AM  Result Value Ref Range   Campylobacter species NOT DETECTED NOT DETECTED   Plesimonas shigelloides NOT DETECTED NOT DETECTED   Salmonella species NOT DETECTED NOT DETECTED   Yersinia enterocolitica NOT DETECTED NOT DETECTED   Vibrio species NOT DETECTED NOT  DETECTED   Vibrio cholerae NOT DETECTED NOT DETECTED   Enteroaggregative E coli (EAEC) NOT DETECTED NOT DETECTED   Enteropathogenic  E coli (EPEC) NOT DETECTED NOT DETECTED   Enterotoxigenic E coli (ETEC) NOT DETECTED NOT DETECTED   Shiga like toxin producing E coli (STEC) NOT DETECTED NOT DETECTED   Shigella/Enteroinvasive E coli (EIEC) NOT DETECTED NOT DETECTED   Cryptosporidium NOT DETECTED NOT DETECTED   Cyclospora cayetanensis NOT DETECTED NOT DETECTED   Entamoeba histolytica NOT DETECTED NOT DETECTED   Giardia lamblia NOT DETECTED NOT DETECTED   Adenovirus F40/41 NOT DETECTED NOT DETECTED   Astrovirus NOT DETECTED NOT DETECTED   Norovirus GI/GII NOT DETECTED NOT DETECTED   Rotavirus A NOT DETECTED NOT DETECTED   Sapovirus (I, II, IV, and V) NOT DETECTED NOT DETECTED  Urinalysis, Complete w Microscopic     Status: Abnormal   Collection Time: 01/15/2017 12:10 PM  Result Value Ref Range   Color, Urine AMBER (A) YELLOW    Comment: BIOCHEMICALS MAY BE AFFECTED BY COLOR   APPearance CLOUDY (A) CLEAR   Specific Gravity, Urine 1.025 1.005 - 1.030   pH 5.0 5.0 - 8.0   Glucose, UA 50 (A) NEGATIVE mg/dL   Hgb urine dipstick SMALL (A) NEGATIVE   Bilirubin Urine NEGATIVE NEGATIVE   Ketones, ur 5 (A) NEGATIVE mg/dL   Protein, ur 30 (A) NEGATIVE mg/dL   Nitrite NEGATIVE NEGATIVE   Leukocytes, UA SMALL (A) NEGATIVE   RBC / HPF 0-5 0 - 5 RBC/hpf   WBC, UA 6-30 0 - 5 WBC/hpf   Bacteria, UA MANY (A) NONE SEEN   Squamous Epithelial / LPF 0-5 (A) NONE SEEN   Amorphous Crystal PRESENT   Glucose, capillary     Status: None   Collection Time: 01/14/2017  3:36 PM  Result Value Ref Range   Glucose-Capillary 98 65 - 99 mg/dL   No components found for: ESR, C REACTIVE PROTEIN MICRO: Recent Results (from the past 720 hour(s))  Culture, blood (routine x 2)     Status: Abnormal   Collection Time: 12/27/16 12:40 AM  Result Value Ref Range Status   Specimen Description BLOOD RIGHT HAND  Final    Special Requests   Final    BOTTLES DRAWN AEROBIC AND ANAEROBIC AERO5ML,ANAERO4ML   Culture  Setup Time   Final    GRAM POSITIVE RODS ANAEROBIC BOTTLE ONLY CRITICAL VALUE NOTED.  VALUE IS CONSISTENT WITH PREVIOUSLY REPORTED AND CALLED VALUE.    Culture (A)  Final    ANAEROBIC GRAM POSITIVE RODS UNABLE TO FURTHER IDENTIFY. CORRECTED RESULTS CALLED TO: A ROBINSON,RN AT 8841 01/03/17 BY L BENFIELD PREVIOUSLY REPORTED AS DIPTHEROIDS Performed at [facility], [place] [address] [place], [place] [address]    Report Status 01/03/2017 FINAL  Final  Culture, blood (routine x 2)     Status: Abnormal   Collection Time: 12/27/16 12:40 AM  Result Value Ref Range Status   Specimen Description BLOOD LEFT HAND  Final   Special Requests   Final    BOTTLES DRAWN AEROBIC AND ANAEROBIC AERO3ML,ANAERO10ML   Culture  Setup Time   Final    GRAM POSITIVE RODS ANAEROBIC BOTTLE ONLY CRITICAL RESULT CALLED TO, READ BACK BY AND VERIFIED WITH: HANK ZOMPA 12/29/16 0900 SGD    Culture (A)  Final    ANAEROBIC GRAM POSITIVE RODS UNABLE TO FURTHER IDENTIFY. Performed at [facility], [place] [address], [place], [place] [address]    Report Status 01/02/2017 FINAL  Final  Blood Culture ID Panel (Reflexed)     Status: None   Collection Time: 12/27/16 12:40 AM  Result Value  Ref Range Status   Enterococcus species NOT DETECTED NOT DETECTED Final   Listeria monocytogenes NOT DETECTED NOT DETECTED Final   Staphylococcus species NOT DETECTED NOT DETECTED Final   Staphylococcus aureus NOT DETECTED NOT DETECTED Final   Streptococcus species NOT DETECTED NOT DETECTED Final   Streptococcus agalactiae NOT DETECTED NOT DETECTED Final   Streptococcus pneumoniae NOT DETECTED NOT DETECTED Final   Streptococcus pyogenes NOT DETECTED NOT DETECTED Final   Acinetobacter baumannii NOT DETECTED NOT DETECTED Final   Enterobacteriaceae species NOT DETECTED NOT DETECTED Final   Enterobacter cloacae complex NOT DETECTED NOT  DETECTED Final   Escherichia coli NOT DETECTED NOT DETECTED Final   Klebsiella oxytoca NOT DETECTED NOT DETECTED Final   Klebsiella pneumoniae NOT DETECTED NOT DETECTED Final   Proteus species NOT DETECTED NOT DETECTED Final   Serratia marcescens NOT DETECTED NOT DETECTED Final   Haemophilus influenzae NOT DETECTED NOT DETECTED Final   Neisseria meningitidis NOT DETECTED NOT DETECTED Final   Pseudomonas aeruginosa NOT DETECTED NOT DETECTED Final   Candida albicans NOT DETECTED NOT DETECTED Final   Candida glabrata NOT DETECTED NOT DETECTED Final   Candida krusei NOT DETECTED NOT DETECTED Final   Candida parapsilosis NOT DETECTED NOT DETECTED Final   Candida tropicalis NOT DETECTED NOT DETECTED Final  MRSA PCR Screening     Status: None   Collection Time: 12/27/16  6:30 AM  Result Value Ref Range Status   MRSA by PCR NEGATIVE NEGATIVE Final    Comment:        The GeneXpert MRSA Assay (FDA approved for NASAL specimens only), is one component of a comprehensive MRSA colonization surveillance program. It is not intended to diagnose MRSA infection nor to guide or monitor treatment for MRSA infections.   Urine culture     Status: None   Collection Time: 12/27/16 11:32 AM  Result Value Ref Range Status   Specimen Description URINE, CLEAN CATCH  Final   Special Requests NONE  Final   Culture   Final    NO GROWTH Performed at [facility], [address], [place], [place] [address]    Report Status 12/28/2016 FINAL  Final  Aerobic/Anaerobic Culture (surgical/deep wound)     Status: None   Collection Time: 01/02/17 10:23 AM  Result Value Ref Range Status   Specimen Description DECUBITIS  Final   Special Requests NONE  Final   Gram Stain   Final    MODERATE WBC PRESENT, PREDOMINANTLY PMN FEW GRAM POSITIVE COCCI IN PAIRS FEW GRAM POSITIVE RODS RARE GRAM NEGATIVE RODS    Culture   Final    MODERATE NORMAL SKIN FLORA FEW BACTEROIDES VULGATUS BETA LACTAMASE  POSITIVE Performed at [facility], [place] [address] [place], [place] [address]    Report Status 01/07/2017 FINAL  Final  C difficile quick scan w PCR reflex     Status: None   Collection Time: 01/02/17 11:24 AM  Result Value Ref Range Status   C Diff antigen NEGATIVE NEGATIVE Final   C Diff toxin NEGATIVE NEGATIVE Final   C Diff interpretation No C. difficile detected.  Final  CULTURE, BLOOD (ROUTINE X 2) w Reflex to ID Panel     Status: None   Collection Time: 01/02/17  3:26 PM  Result Value Ref Range Status   Specimen Description BLOOD RIGHT HAND  Final   Special Requests   Final    BOTTLES DRAWN AEROBIC AND ANAEROBIC ANA14ML AER11ML   Culture NO GROWTH 5  DAYS  Final   Report Status 01/07/2017 FINAL  Final  CULTURE, BLOOD (ROUTINE X 2) w Reflex to ID Panel     Status: None   Collection Time: 01/02/17  3:27 PM  Result Value Ref Range Status   Specimen Description BLOOD LEFT ASSIST CONTROL  Final   Special Requests BOTTLES DRAWN AEROBIC AND ANAEROBIC AER9ML ANA8ML  Final   Culture NO GROWTH 5 DAYS  Final   Report Status 01/07/2017 FINAL  Final  Body fluid culture     Status: None   Collection Time: 01/03/17  2:25 PM  Result Value Ref Range Status   Specimen Description PERITONEAL  Final   Special Requests NONE  Final   Gram Stain   Final    RARE WBC PRESENT,BOTH PMN AND MONONUCLEAR NO ORGANISMS SEEN    Culture   Final    NO GROWTH 3 DAYS Performed at [facility], [place] [address], [place], [place] [address]    Report Status 01/06/2017 FINAL  Final  Gastrointestinal Panel by PCR , Stool     Status: None   Collection Time: 01/11/17 10:34 AM  Result Value Ref Range Status   Campylobacter species NOT DETECTED NOT DETECTED Final   Plesimonas shigelloides NOT DETECTED NOT DETECTED Final   Salmonella species NOT DETECTED NOT DETECTED Final   Yersinia enterocolitica NOT DETECTED NOT DETECTED Final   Vibrio species NOT DETECTED NOT DETECTED Final   Vibrio cholerae NOT  DETECTED NOT DETECTED Final   Enteroaggregative E coli (EAEC) NOT DETECTED NOT DETECTED Final   Enteropathogenic E coli (EPEC) NOT DETECTED NOT DETECTED Final   Enterotoxigenic E coli (ETEC) NOT DETECTED NOT DETECTED Final   Shiga like toxin producing E coli (STEC) NOT DETECTED NOT DETECTED Final   Shigella/Enteroinvasive E coli (EIEC) NOT DETECTED NOT DETECTED Final   Cryptosporidium NOT DETECTED NOT DETECTED Final   Cyclospora cayetanensis NOT DETECTED NOT DETECTED Final   Entamoeba histolytica NOT DETECTED NOT DETECTED Final   Giardia lamblia NOT DETECTED NOT DETECTED Final   Adenovirus F40/41 NOT DETECTED NOT DETECTED Final   Astrovirus NOT DETECTED NOT DETECTED Final   Norovirus GI/GII NOT DETECTED NOT DETECTED Final   Rotavirus A NOT DETECTED NOT DETECTED Final   Sapovirus (I, II, IV, and V) NOT DETECTED NOT DETECTED Final  C difficile quick scan w PCR reflex     Status: None   Collection Time: 01/17/2017 11:28 AM  Result Value Ref Range Status   C Diff antigen NEGATIVE NEGATIVE Final   C Diff toxin NEGATIVE NEGATIVE Final   C Diff interpretation No C. difficile detected.  Final  Gastrointestinal Panel by PCR , Stool     Status: None   Collection Time: 02/04/2017 11:28 AM  Result Value Ref Range Status   Campylobacter species NOT DETECTED NOT DETECTED Final   Plesimonas shigelloides NOT DETECTED NOT DETECTED Final   Salmonella species NOT DETECTED NOT DETECTED Final   Yersinia enterocolitica NOT DETECTED NOT DETECTED Final   Vibrio species NOT DETECTED NOT DETECTED Final   Vibrio cholerae NOT DETECTED NOT DETECTED Final   Enteroaggregative E coli (EAEC) NOT DETECTED NOT DETECTED Final   Enteropathogenic E coli (EPEC) NOT DETECTED NOT DETECTED Final   Enterotoxigenic E coli (ETEC) NOT DETECTED NOT DETECTED Final   Shiga like toxin producing E coli (STEC) NOT DETECTED NOT DETECTED Final   Shigella/Enteroinvasive E coli (EIEC) NOT DETECTED NOT DETECTED Final   Cryptosporidium NOT  DETECTED NOT DETECTED Final  Cyclospora cayetanensis NOT DETECTED NOT DETECTED Final   Entamoeba histolytica NOT DETECTED NOT DETECTED Final   Giardia lamblia NOT DETECTED NOT DETECTED Final   Adenovirus F40/41 NOT DETECTED NOT DETECTED Final   Astrovirus NOT DETECTED NOT DETECTED Final   Norovirus GI/GII NOT DETECTED NOT DETECTED Final   Rotavirus A NOT DETECTED NOT DETECTED Final   Sapovirus (I, II, IV, and V) NOT DETECTED NOT DETECTED Final    IMAGING: Dg Chest 1 View  Result Date: 01/23/2017 CLINICAL DATA:  Chest pain and vomiting. EXAM: CHEST 1 VIEW COMPARISON:  One-view chest x-ray 12/27/2016 FINDINGS: The heart size is exaggerate by low lung volumes. Moderate pulmonary vascular congestion is present. New left basilar airspace disease is present. The tip of a right-sided PICC line terminates at the cavoatrial junction. IMPRESSION: 1. Borderline cardiomegaly with increasing pulmonary vascular congestion. 2. New left basilar airspace disease likely reflects atelectasis. Infection is not excluded. Te Electronically Signed   By: San Morelle M.D.   On: 01/13/2017 10:55   Ct Abdomen Pelvis W Contrast  Result Date: 12/27/2016 CLINICAL DATA:  56 year old hypertensive male with bleeding decubitus ulcer presenting to emergency room with hypotension and tachycardia. Alcoholic cirrhosis. Congestive heart. Atrial fibrillation on anticoagulation. Subsequent encounter. EXAM: CT ABDOMEN AND PELVIS WITH CONTRAST TECHNIQUE: Multidetector CT imaging of the abdomen and pelvis was performed using the standard protocol following bolus administration of intravenous contrast. CONTRAST:  50m ISOVUE-300 IOPAMIDOL (ISOVUE-300) INJECTION 61% COMPARISON:  No comparison CT of the abdomen and pelvis. Ultrasound for paracentesis 12/17/2016. FINDINGS: Exam is motion degraded. Lower chest: Minimal lung base atelectasis. Aortic root and mitral valve calcification. Coronary artery calcification. Heart size top-normal.  Hepatobiliary: Cirrhotic liver without mass identified on this motion degraded exam. Main portal vein appears patent. No calcified gallstone. Pancreas: Motion degraded exam without pancreatic mass identified. Splenic vein appears patent. Spleen: No splenic mass or enlargement. Adrenals/Urinary Tract: Left lower pole 4 mm nonobstructing stone. No hydronephrosis. 5 mm left renal cyst may be present but too small to adequately characterize. No worrisome renal or adrenal lesion. Stomach/Bowel: Evaluation limited by prominent ascites and underdistention. Mild thickening of the antrum/probable may be related to under distension rather than gastritis. Tiny duodenal diverticulum. Scattered colonic diverticula most notable descending colon Vascular/Lymphatic: Atherosclerotic changes aorta, iliac arteries and femoral arteries. Mild ectasia of the abdominal aorta without focal aneurysm. Narrowing aortic branch vessels without large vessel occlusion. Top-normal size lymph nodes most notable in inguinal region without adenopathy. Reproductive: Partial calcification prostate gland. Foley catheter in place with decompressed urinary bladder. Other: Prominent ascites and third spacing of fluid. Sacral decubitus ulcer with gas extending to the coccyx/lower sacrum. Inflammatory process/ gas extends into the right ischial rectal/ischial anal fossa with mild impression upon the right puborectalis muscle and minimal flattening of the right aspect of the rectum without clear fistula/communication with the rectum. Gas extends to the right operator internus muscle. Musculoskeletal: Remote T12 superior endplate compression fracture with anterior wedging. Gas within the T11-12 disc. Mild sclerosis upper sacrum. IMPRESSION: Cirrhosis, ascites and third spacing of fluid. Sacral decubitus ulcer with gas extending to the coccyx/lower sacrum. Inflammatory process/ gas extends into the right ischial rectal/ischial anal fossa with mild impression  upon the right puborectalis muscle and minimal flattening of the right aspect of the rectum without clear fistula/communication with the rectum. Gas extends to the right operator internus muscle. Limited evaluation of bowel secondary to motion, ascites and underdistention. The slight thickening of the gastric antrum/ pylorus may be related  to this underdistention but cannot exclude inflammation. Scattered colonic diverticula. Atherosclerotic changes as detailed above. Electronically Signed   By: Genia Del M.D.   On: 12/27/2016 09:27   US Paracentesis  Result Date: 01/03/2017 INDICATION: Cirrhosis and ascites. EXAM: ULTRASOUND GUIDED PARACENTESIS MEDICATIONS: None. COMPLICATIONS: None immediate. PROCEDURE: Informed written consent was obtained from the patient after a discussion of the risks, benefits and alternatives to treatment. A timeout was performed prior to the initiation of the procedure. Initial ultrasound was used to localize ascites. The right lower abdomen was prepped and draped in the usual sterile fashion. 1% lidocaine was used for local anesthesia. Following this, a 6 Fr Safe-T-Centesis catheter was introduced. An ultrasound image was saved for documentation purposes. The paracentesis was performed. The catheter was removed and a dressing was applied. The patient tolerated the procedure well without immediate post procedural complication. FINDINGS: A total of approximately 13.8 L of yellow fluid was removed. IMPRESSION: Successful ultrasound-guided paracentesis yielding 13.8 liters of peritoneal fluid. Electronically Signed   By: Aletta Edouard M.D.   On: 01/03/2017 17:01   Dg Chest Portable 1 View  Result Date: 12/27/2016 CLINICAL DATA:  Central line placement EXAM: PORTABLE CHEST 1 VIEW COMPARISON:  Chest radiograph 12/26/2016 FINDINGS: Right subclavian approach central venous catheter tip overlies the lower SVC. The remainder the examination is unchanged. IMPRESSION: Right subclavian  central venous catheter tip overlying the lower superior vena cava. Electronically Signed   By: Ulyses Jarred M.D.   On: 12/27/2016 05:18   Dg Chest Portable 1 View  Result Date: 12/27/2016 CLINICAL DATA:  Cough EXAM: PORTABLE CHEST 1 VIEW COMPARISON:  Chest radiograph 12/15/2016 FINDINGS: Unchanged cardiomegaly. No focal airspace consolidation or pulmonary edema. No pneumothorax or sizable pleural effusion. IMPRESSION: Unchanged cardiomegaly without overt pulmonary edema. Electronically Signed   By: Ulyses Jarred M.D.   On: 12/27/2016 00:10    Assessment:   KHYLIN GUTRIDGE is a 55 y.o. male readmitted with sepsis and ARF following DC a week ago on IV ctx and oral flagyl for a severe sacral decub. Currently the wound appears clean and I think would be unlikley source of his presentation. Main issue seems to be his ARF which may be related to his recent NV  Recommendations Continue vanco Change back to ceftraixone and flagyl  Await bcx Wu renal failure Thank you very much for allowing me to participate in the care of this patient. Please call with questions.   Cheral Marker. Ola Spurr, MD

## 2017-01-21 NOTE — Progress Notes (Signed)
Pt HR dropped in to 40's briefly. Pt HR increased to 50's. Pt currently remains in Sinus [place] FoleyBrady. At 57. NP paged to notify will continue to assess.

## 2017-01-21 NOTE — ED Provider Notes (Signed)
[facility] Emergency Department Provider Note  ____________________________________________   First MD Initiated Contact with Patient 01-28-2017 1019     (approximate)  I have reviewed the triage vital signs and the nursing notes.   HISTORY  Chief Complaint Hypotension; Nausea; and Wound Infection   HPI [name] is a 56 y.o. male With a history of atrial fibrillation as well as a sacral decubitus ulcer which she has a PICC line and receiving antibiotics for his lengthy emergency department with hypertension as well as nausea and vomiting. He denies any pain at this time. However, he said that he had mild chest pain before when he was actively vomiting. No blood in the vomitus or diarrhea. Denies any worsening abdominal distention or pain in his abdomen.   Past Medical History:  Diagnosis Date  . Anal fissure   . Atrial fibrillation (HCC)   . CHF (congestive heart failure) (HCC)   . Heart murmur   . Hypertension   . Lymphadenopathy   . Personal history of colonic polyps   . Pressure ulcer, sacrum     Patient Active Problem List   Diagnosis Date Noted  . Malnutrition of moderate degree 01/11/2017  . Decubitus ulcer of sacral region, stage 3 (HCC)   . Adjustment disorder with mixed anxiety and depressed mood 12/30/2016  . Sepsis (HCC) 12/27/2016  . Alcoholic cirrhosis of liver with ascites (HCC)   . Pressure injury of skin 12/16/2016  . Acute renal failure (ARF) (HCC) 12/15/2016  . Anemia 12/15/2016  . 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 Hyponatremia 04/17/2016    Past Surgical History:  Procedure Laterality Date  . COLONOSCOPY  2012  . ESOPHAGOGASTRODUODENOSCOPY (EGD) WITH PROPOFOL N/A 12/18/2016   Procedure: ESOPHAGOGASTRODUODENOSCOPY (EGD) WITH PROPOFOL;  Surgeon: Toney Reil, MD;  Location: [facility] ENDOSCOPY;  Service: Endoscopy;  Laterality: N/A;  . HERNIA REPAIR  2012  . larynx-amyloidosis-laser surgery   2010    Prior to Admission medications   Medication Sig Start Date End Date Taking? Authorizing Provider  benazepril (LOTENSIN) 40 MG tablet Take 1 tablet (40 mg total) by mouth daily. 12/21/16  Yes Vipul Sherryll Burger, MD  cefTRIAXone 2 g in dextrose 5 % 50 mL Inject 2 g into the vein daily. 01/14/17 02/15/17 Yes Houston Siren, MD  citalopram (CELEXA) 10 MG tablet Take 1 tablet (10 mg total) by mouth at bedtime. 01/14/17  Yes Houston Siren, MD  DAKINS EX Apply 1 application topically. Everyday and every evening   Yes Historical Provider, MD  feeding supplement, ENSURE ENLIVE, (ENSURE ENLIVE) LIQD Take 237 mLs by mouth 2 (two) times daily with a meal. 01/14/17  Yes Houston Siren, MD  folic acid (FOLVITE) 1 MG tablet Take 1 tablet (1 mg total) by mouth daily. 12/21/16  Yes Vipul Sherryll Burger, MD  furosemide (LASIX) 40 MG tablet Take 0.5 tablets (20 mg total) by mouth daily. 01/14/17  Yes Houston Siren, MD  metroNIDAZOLE (FLAGYL) 500 MG tablet Take 1 tablet (500 mg total) by mouth 3 (three) times daily. Patient taking differently: Take 500 mg by mouth 3 (three) times daily. [id number] 01/14/17 02/15/17 Yes Houston Siren, MD  midodrine (PROAMATINE) 5 MG tablet Take 1 tablet (5 mg total) by mouth 3 (three) times daily with meals. Patient taking differently: Take 5 mg by  mouth 3 (three) times daily with meals. [id number] 01/14/17  Yes Houston Siren, MD  naproxen sodium (ALEVE) 220 MG tablet Take 440 mg by mouth 2 (two) times daily with a meal.   Yes Historical Provider, MD  oxyCODONE (OXY IR/ROXICODONE) 5 MG immediate release tablet Take 1 tablet (5 mg total) by mouth every 6 (six) hours as needed for moderate pain or severe pain. 01/14/17  Yes Houston Siren, MD  potassium chloride (K-DUR,KLOR-CON) 10 MEQ tablet Take 10 mEq by mouth daily.    Yes Historical Provider, MD    promethazine (PHENERGAN) 25 MG/ML injection Inject 25 mg into the vein once.   Yes Historical Provider, MD  sotalol (BETAPACE) 80 MG tablet Take 1 tablet (80 mg total) by mouth 2 (two) times daily. Patient taking differently: Take 80 mg by mouth 2 (two) times daily. [id number] 12/21/16  Yes Delfino Lovett, MD  spironolactone (ALDACTONE) 25 MG tablet Take 0.5 tablets (12.5 mg total) by mouth daily. 01/15/17  Yes Houston Siren, MD    Allergies Clindamycin/lincomycin  Family History  Problem Relation Age of Onset  . Hypertension Other   . Hypertension Brother     Social History Social History  Substance Use Topics  . Smoking status: Current Every Day Smoker    Packs/day: 1.00    Years: 20.00  . Smokeless tobacco: Never Used  . Alcohol use Yes    Review of Systems Constitutional: No fever/chills Eyes: No visual changes. ENT: No sore throat. Cardiovascular: Denies chest pain. Respiratory: Denies shortness of breath. Gastrointestinal: No abdominal pain.   No diarrhea.  No constipation. Genitourinary: Negative for dysuria. Musculoskeletal: Negative for back pain. Skin: Negative for rash. Neurological: Negative for headaches, focal weakness or numbness.  10-point ROS otherwise negative.  ____________________________________________   PHYSICAL EXAM:  VITAL SIGNS: ED Triage Vitals  Enc Vitals Group     BP 02/01/17 1021 94/83     Pulse Rate February 01, 2017 1021 (!) 44     Resp 2017/02/01 1021 14     Temp --      Temp src --      SpO2 February 01, 2017 1021 92 %     Weight 02/01/17 1024 290 lb (131.5 kg)     Height 02/01/17 1024 6\' 4"  (1.93 m)     Head Circumference --      Peak Flow --      Pain Score --      Pain Loc --      Pain Edu? --      Excl. in GC? --     Constitutional: Alert and oriented. Well appearing and in no acute distress. Eyes: Conjunctivae are normal. PERRL. EOMI. Head: Atraumatic. Nose: No congestion/rhinnorhea. Mouth/Throat: Mucous membranes are moist.   Neck: No  stridor.   Cardiovascular: ardiac with an irregularly irregular rhythm. Grossly normal heart sounds.   Respiratory: Normal respiratory effort.  No retractions. Lungs CTAB. Gastrointestinal: Soft and nontender. Mildly distended but without being tense.No distention. Musculoskeletal: bilateral lower extremity edema which is mild to severe with changes that appear to be from chronic stasis to the skin The skin is thickened and leather like. There is no erythema or induration to the skin of the lower extremities.  No joint effusions. Neurologic:  Normal speech and language. No gross focal neurologic deficits are appreciated.  Skin:  Sacral wound which is deep but not to bone. It is about 4 cm in the vertical plane as well as 4 cm in a horizontal plane  at about 4cm deep as well. It appears to be pink to red without any pus drainage at this time. It was contaminated with a small amount of stoolon the dressing/packing. There is bright red erythema surrounding the wound but with only minimal tenderness without any induration. No crepitus. Psychiatric: Mood and affect are normal. Speech and behavior are normal.  ____________________________________________   LABS (all labs ordered are listed, but only abnormal results are displayed)  Labs Reviewed  CBC WITH DIFFERENTIAL/PLATELET - Abnormal; Notable for the following:       Result Value   RBC 3.85 (*)    HCT 39.2 (*)    MCV 101.8 (*)    RDW 16.6 (*)    Platelets 122 (*)    Neutro Abs 6.8 (*)    Lymphs Abs 0.7 (*)    All other components within normal limits  COMPREHENSIVE METABOLIC PANEL - Abnormal; Notable for the following:    Chloride 99 (*)    Glucose, Bld 100 (*)    BUN 43 (*)    Creatinine, Ser 6.14 (*)    Calcium 8.5 (*)    Albumin 2.0 (*)    GFR calc non Af Amer 9 (*)    GFR calc Af Amer 11 (*)    All other components within normal limits  URINALYSIS, COMPLETE (UACMP) WITH MICROSCOPIC - Abnormal; Notable for the following:    Color,  Urine AMBER (*)    APPearance CLOUDY (*)    Glucose, UA 50 (*)    Hgb urine dipstick SMALL (*)    Ketones, ur 5 (*)    Protein, ur 30 (*)    Leukocytes, UA SMALL (*)    Bacteria, UA MANY (*)    Squamous Epithelial / LPF 0-5 (*)    All other components within normal limits  TROPONIN I - Abnormal; Notable for the following:    Troponin I 0.06 (*)    All other components within normal limits  LACTIC ACID, PLASMA - Abnormal; Notable for the following:    Lactic Acid, Venous 2.2 (*)    All other components within normal limits  C DIFFICILE QUICK SCREEN W PCR REFLEX  GASTROINTESTINAL PANEL BY PCR, STOOL (REPLACES STOOL CULTURE)  CULTURE, BLOOD (ROUTINE X 2)  CULTURE, BLOOD (ROUTINE X 2)  URINE CULTURE  LIPASE, BLOOD  LACTIC ACID, PLASMA   ____________________________________________  EKG  ED ECG REPORT I, Keren Alverio,  Teena Iraniavid M, the attending physician, personally viewed and interpreted this ECG.   Date: 01/20/2017  EKG Time: 1026  Rate: 50  Rhythm: atrial fibrillation, rate 50  Axis: normal  Intervals:nonspecific intraventricular conduction delay  ST&T Change: No ST elevation or depression. No abnormal T-wave inversion.  ____________________________________________  RADIOLOGY    DG Chest 1 View (Final result)  Result time 02/03/2017 10:55:54  Final result by Marin Robertshristopher Mattern, MD (01/22/2017 10:55:54)           Narrative:   CLINICAL DATA: Chest pain and vomiting.  EXAM: CHEST 1 VIEW  COMPARISON: One-view chest x-ray 12/27/2016  FINDINGS: The heart size is exaggerate by low lung volumes. Moderate pulmonary vascular congestion is present. New left basilar airspace disease is present. The tip of a right-sided PICC line terminates at the cavoatrial junction.  IMPRESSION: 1. Borderline cardiomegaly with increasing pulmonary vascular congestion. 2. New left basilar airspace disease likely reflects atelectasis. Infection is not excluded. Te   Electronically  Signed By: Marin Robertshristopher Mattern M.D. On: 01/28/2017 10:55  ____________________________________________   PROCEDURES  Procedure(s) performed:   Procedures  Critical Care performed:   CRITICAL CARE Performed by: Arelia Longest   Total critical care time: 35 minutes  Critical care time was exclusive of separately billable procedures and treating other patients.  Critical care was necessary to treat or prevent imminent or life-threatening deterioration.  Critical care was time spent personally by me on the following activities: development of treatment plan with patient and/or surrogate as well as nursing, discussions with consultants, evaluation of patient's response to treatment, examination of patient, obtaining history from patient or surrogate, ordering and performing treatments and interventions, ordering and review of laboratory studies, ordering and review of radiographic studies, pulse oximetry and re-evaluation of patient's condition.  ____________________________________________   INITIAL IMPRESSION / ASSESSMENT AND PLAN / ED COURSE  Pertinent labs & imaging results that were available during my care of the patient were reviewed by me and considered in my medical decision making (see chart for details).  ----------------------------------------- 11:18 AM on 01/20/2017 -----------------------------------------  Patient initially with systolic blood pressure in the 90s. Blood pressure began trending down and is now in the 60s over 40s. Sepsis protocol initiated with fluid boluses. We'll also give broad-spectrum anabiotic's. The patient is on ceftriaxone as well as Flagyl at his skilled nursing facility. Patient wit lab work but withchest x-ray with possible new left basilar airspace disease.    ----------------------------------------- 1:10 PM on 01/13/2017 -----------------------------------------  Sepsis alert called on the patient after  blood pressure had returned to the 60s. Broad-spectrum anabiotic's ordered. Fluid boluses started and pressure had come up to the 80s. However, it then decreased back down to the 60s. The patient's heart rate was also persistently in the 40s. Atropine of 0.5 mg was given which brought the heart rate up to 66. However, the blood pressures persistently low now dropping down to the 50s. The patient has a right upper extremity PICC line will be starting Levophed. I discussed the case with the ICU intensivist, Dr. Nicholos Johns will be admitting the patient to the ICU. Patient continues to [facility] well. Updated him and his wife as to the diagnosis as well as need for pressors and admission to the hospital. They're understanding and willing to comply.  ----------------------------------------- 1:42 PM on 01/17/2017 -----------------------------------------  On Levophed now the patient's blood pressure is 100/63. We will have a goal map of 65. Nurse to titrate. ____________________________________________   FINAL CLINICAL IMPRESSION(S) / ED DIAGNOSES  Final diagnoses:  Cellulitis of buttock  Septic shock (HCC)  Acute kidney injury (HCC)  Nausea and vomiting, intractability of vomiting not specified, unspecified vomiting type  Diarrhea, unspecified type   Hospital-acquired pneumonia.   NEW MEDICATIONS STARTED DURING THIS VISIT:  New Prescriptions   No medications on file     Note:  This document was prepared using Dragon voice recognition software and may include unintentional dictation errors.    Myrna Blazer, MD 01/14/2017 [phone]

## 2017-01-21 NOTE — Consult Note (Signed)
PHARMACY - CRITICAL CARE PROGRESS NOTE  Pharmacy Consult for medication adjustment for CRRT Indication: CRRT   Allergies  Allergen Reactions  . Clindamycin/Lincomycin Diarrhea    Patient Measurements: Height: 6\' 4"  (193 cm) Weight: 294 lb 15.6 oz (133.8 kg) IBW/kg (Calculated) : 86.8 Adjusted Body Weight:   Vital Signs: Temp: 97.6 F (36.4 C) (02/09 1600) Temp Source: Oral (02/09 1600) BP: 87/62 (02/09 1800) Pulse Rate: 62 (02/09 1800) Intake/Output from previous day: No intake/output data recorded. Intake/Output from this shift: No intake/output data recorded. Vent settings for last 24 hours: FiO2 (%):  [3 %] 3 %  Labs:  Recent Labs  01/14/2017 1027  WBC 8.0  HGB 13.1  HCT 39.2*  PLT 122*  CREATININE 6.14*  ALBUMIN 2.0*  PROT 6.5  AST 31  ALT 17  ALKPHOS 102  BILITOT 1.1   Estimated Creatinine Clearance: 20.1 mL/min (by C-G formula based on SCr of 6.14 mg/dL (H)).   Recent Labs  01/22/2017 1536  GLUCAP 98    Microbiology: Recent Results (from the past 720 hour(s))  Culture, blood (routine x 2)     Status: Abnormal   Collection Time: 12/27/16 12:40 AM  Result Value Ref Range Status   Specimen Description BLOOD RIGHT HAND  Final   Special Requests   Final    BOTTLES DRAWN AEROBIC AND ANAEROBIC AERO5ML,ANAERO4ML   Culture  Setup Time   Final    GRAM POSITIVE RODS ANAEROBIC BOTTLE ONLY CRITICAL VALUE NOTED.  VALUE IS CONSISTENT WITH PREVIOUSLY REPORTED AND CALLED VALUE.    Culture (A)  Final    ANAEROBIC GRAM POSITIVE RODS UNABLE TO FURTHER IDENTIFY. CORRECTED RESULTS CALLED TO: A ROBINSON,RN AT [id number] 01/03/17 BY L BENFIELD PREVIOUSLY REPORTED AS DIPTHEROIDS Performed at [facility], [address], [place], [place] [address]    Report Status 01/03/2017 FINAL  Final  Culture, blood (routine x 2)     Status: Abnormal   Collection Time: 12/27/16 12:40 AM  Result Value Ref Range Status   Specimen Description BLOOD LEFT HAND  Final   Special  Requests   Final    BOTTLES DRAWN AEROBIC AND ANAEROBIC AERO3ML,ANAERO10ML   Culture  Setup Time   Final    GRAM POSITIVE RODS ANAEROBIC BOTTLE ONLY CRITICAL RESULT CALLED TO, READ BACK BY AND VERIFIED WITH: HANK ZOMPA 12/29/16 0900 SGD    Culture (A)  Final    ANAEROBIC GRAM POSITIVE RODS UNABLE TO FURTHER IDENTIFY. Performed at [facility], [address], [place], [place] [address]    Report Status 01/02/2017 FINAL  Final  Blood Culture ID Panel (Reflexed)     Status: None   Collection Time: 12/27/16 12:40 AM  Result Value Ref Range Status   Enterococcus species NOT DETECTED NOT DETECTED Final   Listeria monocytogenes NOT DETECTED NOT DETECTED Final   Staphylococcus species NOT DETECTED NOT DETECTED Final   Staphylococcus aureus NOT DETECTED NOT DETECTED Final   Streptococcus species NOT DETECTED NOT DETECTED Final   Streptococcus agalactiae NOT DETECTED NOT DETECTED Final   Streptococcus pneumoniae NOT DETECTED NOT DETECTED Final   Streptococcus pyogenes NOT DETECTED NOT DETECTED Final   Acinetobacter baumannii NOT DETECTED NOT DETECTED Final   Enterobacteriaceae species NOT DETECTED NOT DETECTED Final   Enterobacter cloacae complex NOT DETECTED NOT DETECTED Final   Escherichia coli NOT DETECTED NOT DETECTED Final   Klebsiella oxytoca NOT DETECTED NOT DETECTED Final   Klebsiella pneumoniae NOT DETECTED NOT DETECTED Final   Proteus species  NOT DETECTED NOT DETECTED Final   Serratia marcescens NOT DETECTED NOT DETECTED Final   Haemophilus influenzae NOT DETECTED NOT DETECTED Final   Neisseria meningitidis NOT DETECTED NOT DETECTED Final   Pseudomonas aeruginosa NOT DETECTED NOT DETECTED Final   Candida albicans NOT DETECTED NOT DETECTED Final   Candida glabrata NOT DETECTED NOT DETECTED Final   Candida krusei NOT DETECTED NOT DETECTED Final   Candida parapsilosis NOT DETECTED NOT DETECTED Final   Candida tropicalis NOT DETECTED NOT DETECTED Final  MRSA PCR Screening      Status: None   Collection Time: 12/27/16  6:30 AM  Result Value Ref Range Status   MRSA by PCR NEGATIVE NEGATIVE Final    Comment:        The GeneXpert MRSA Assay (FDA approved for NASAL specimens only), is one component of a comprehensive MRSA colonization surveillance program. It is not intended to diagnose MRSA infection nor to guide or monitor treatment for MRSA infections.   Urine culture     Status: None   Collection Time: 12/27/16 11:32 AM  Result Value Ref Range Status   Specimen Description URINE, CLEAN CATCH  Final   Special Requests NONE  Final   Culture   Final    NO GROWTH Performed at [facility], [address] [place], [place] [address]    Report Status 12/28/2016 FINAL  Final  Aerobic/Anaerobic Culture (surgical/deep wound)     Status: None   Collection Time: 01/02/17 10:23 AM  Result Value Ref Range Status   Specimen Description DECUBITIS  Final   Special Requests NONE  Final   Gram Stain   Final    MODERATE WBC PRESENT, PREDOMINANTLY PMN FEW GRAM POSITIVE COCCI IN PAIRS FEW GRAM POSITIVE RODS RARE GRAM NEGATIVE RODS    Culture   Final    MODERATE NORMAL SKIN FLORA FEW BACTEROIDES VULGATUS BETA LACTAMASE POSITIVE Performed at [facility], [address] [place], [place] [address]    Report Status 01/07/2017 FINAL  Final  C difficile quick scan w PCR reflex     Status: None   Collection Time: 01/02/17 11:24 AM  Result Value Ref Range Status   C Diff antigen NEGATIVE NEGATIVE Final   C Diff toxin NEGATIVE NEGATIVE Final   C Diff interpretation No C. difficile detected.  Final  CULTURE, BLOOD (ROUTINE X 2) w Reflex to ID Panel     Status: None   Collection Time: 01/02/17  3:26 PM  Result Value Ref Range Status   Specimen Description BLOOD RIGHT HAND  Final   Special Requests   Final    BOTTLES DRAWN AEROBIC AND ANAEROBIC ANA14ML AER11ML   Culture NO GROWTH 5 DAYS  Final   Report Status 01/07/2017 FINAL  Final  CULTURE, BLOOD  (ROUTINE X 2) w Reflex to ID Panel     Status: None   Collection Time: 01/02/17  3:27 PM  Result Value Ref Range Status   Specimen Description BLOOD LEFT ASSIST CONTROL  Final   Special Requests BOTTLES DRAWN AEROBIC AND ANAEROBIC AER9ML ANA8ML  Final   Culture NO GROWTH 5 DAYS  Final   Report Status 01/07/2017 FINAL  Final  Body fluid culture     Status: None   Collection Time: 01/03/17  2:25 PM  Result Value Ref Range Status   Specimen Description PERITONEAL  Final   Special Requests NONE  Final   Gram Stain   Final    RARE WBC PRESENT,BOTH PMN  AND MONONUCLEAR NO ORGANISMS SEEN    Culture   Final    NO GROWTH 3 DAYS Performed at [facility], [address], [place], [place] [address]    Report Status 01/06/2017 FINAL  Final  Gastrointestinal Panel by PCR , Stool     Status: None   Collection Time: 01/11/17 10:34 AM  Result Value Ref Range Status   Campylobacter species NOT DETECTED NOT DETECTED Final   Plesimonas shigelloides NOT DETECTED NOT DETECTED Final   Salmonella species NOT DETECTED NOT DETECTED Final   Yersinia enterocolitica NOT DETECTED NOT DETECTED Final   Vibrio species NOT DETECTED NOT DETECTED Final   Vibrio cholerae NOT DETECTED NOT DETECTED Final   Enteroaggregative E coli (EAEC) NOT DETECTED NOT DETECTED Final   Enteropathogenic E coli (EPEC) NOT DETECTED NOT DETECTED Final   Enterotoxigenic E coli (ETEC) NOT DETECTED NOT DETECTED Final   Shiga like toxin producing E coli (STEC) NOT DETECTED NOT DETECTED Final   Shigella/Enteroinvasive E coli (EIEC) NOT DETECTED NOT DETECTED Final   Cryptosporidium NOT DETECTED NOT DETECTED Final   Cyclospora cayetanensis NOT DETECTED NOT DETECTED Final   Entamoeba histolytica NOT DETECTED NOT DETECTED Final   Giardia lamblia NOT DETECTED NOT DETECTED Final   Adenovirus F40/41 NOT DETECTED NOT DETECTED Final   Astrovirus NOT DETECTED NOT DETECTED Final   Norovirus GI/GII NOT DETECTED NOT DETECTED Final   Rotavirus  A NOT DETECTED NOT DETECTED Final   Sapovirus (I, II, IV, and V) NOT DETECTED NOT DETECTED Final  C difficile quick scan w PCR reflex     Status: None   Collection Time: 02/07/2017 11:28 AM  Result Value Ref Range Status   C Diff antigen NEGATIVE NEGATIVE Final   C Diff toxin NEGATIVE NEGATIVE Final   C Diff interpretation No C. difficile detected.  Final  Gastrointestinal Panel by PCR , Stool     Status: None   Collection Time: 02/04/2017 11:28 AM  Result Value Ref Range Status   Campylobacter species NOT DETECTED NOT DETECTED Final   Plesimonas shigelloides NOT DETECTED NOT DETECTED Final   Salmonella species NOT DETECTED NOT DETECTED Final   Yersinia enterocolitica NOT DETECTED NOT DETECTED Final   Vibrio species NOT DETECTED NOT DETECTED Final   Vibrio cholerae NOT DETECTED NOT DETECTED Final   Enteroaggregative E coli (EAEC) NOT DETECTED NOT DETECTED Final   Enteropathogenic E coli (EPEC) NOT DETECTED NOT DETECTED Final   Enterotoxigenic E coli (ETEC) NOT DETECTED NOT DETECTED Final   Shiga like toxin producing E coli (STEC) NOT DETECTED NOT DETECTED Final   Shigella/Enteroinvasive E coli (EIEC) NOT DETECTED NOT DETECTED Final   Cryptosporidium NOT DETECTED NOT DETECTED Final   Cyclospora cayetanensis NOT DETECTED NOT DETECTED Final   Entamoeba histolytica NOT DETECTED NOT DETECTED Final   Giardia lamblia NOT DETECTED NOT DETECTED Final   Adenovirus F40/41 NOT DETECTED NOT DETECTED Final   Astrovirus NOT DETECTED NOT DETECTED Final   Norovirus GI/GII NOT DETECTED NOT DETECTED Final   Rotavirus A NOT DETECTED NOT DETECTED Final   Sapovirus (I, II, IV, and V) NOT DETECTED NOT DETECTED Final    Medications:  Scheduled:  . atropine  0.5 mg Intravenous Once  . cefTRIAXone  2 g Intravenous Q24H  . [START ON 01/22/2017] Chlorhexidine Gluconate Cloth  6 each Topical Q0600  . citalopram  10 mg Oral QHS  . fluconazole (DIFLUCAN) IV  800 mg Intravenous Once   Followed by  . [START ON  01/22/2017] fluconazole (DIFLUCAN) IV  400 mg Intravenous Q24H  . folic acid  1 mg Intravenous Daily  . mouth rinse  15 mL Mouth Rinse BID  . metroNIDAZOLE  500 mg Oral TID  . midodrine  5 mg Oral TID WC  . mupirocin ointment  1 application Nasal BID    Assessment: Pt is a 56 year old male who was admitted w/ an infected sacral decubitius ulcer found to be in renal failure. Pt was d/c to [employer] of 2/2 on ceftiaxone and flagyl. Pt now in septic shock, hypotension and oliguira. Pt being initiated on CRRT. Pharmacy to adjust meds as needed.   Plan:  All medications are currently adjusted for CRRT. Fluconazole already adjusted for CRRT, no other meds currently require adjustment. Pharmacy to continue to monitor  Olene Floss, Pharm.D, BCPS Clinical Pharmacist  02/08/2017,7:24 PM

## 2017-01-21 NOTE — ED Notes (Signed)
NP at bedside talking to family about CCU admission

## 2017-01-21 NOTE — Consult Note (Signed)
[facility]  CONSULT NOTE    Date: 02/03/2017                  Patient Name:  [name]  MRN: [id number]  DOB: May 20, 1961  Age / Sex: 56 y.o., male         PCP: Corky Downs, MD                 Service Requesting Consult: Dr. Nicholos Johns                 Reason for Consult: Acute renal failure            History of Present Illness: [name] is a 56 y.o. white male with decubitus ulcer, hypertension, congestive heart failure, atrial fibrillation, depression who was admitted to [facility] from 1/14 to 2/2 for sepsis. He was discharged with metronidazole and ceftriaxone for a total of six week. patient was admitted to [facility] on 2017-02-03 for Cellulitis of buttock [L03.317] Acute renal failure (ARF) (HCC) [N17.9] Acute kidney injury (HCC) [N17.9] Hospital acquired PNA [J18.9] Septic shock (HCC) [A41.9, R65.21] Diarrhea, unspecified type [R19.7] Nausea and vomiting, intractability of vomiting not specified, unspecified vomiting type [R11.2]   Creatinine elevated at 6.14 from baseline of 0.86. He has been placed on norepinephrine and a renal consult placed.    Medications: Outpatient medications: Prescriptions Prior to Admission  Medication Sig Dispense Refill Last Dose  . benazepril (LOTENSIN) 40 MG tablet Take 1 tablet (40 mg total) by mouth daily. 30 tablet 0 01/20/2017 at 0800  . cefTRIAXone 2 g in dextrose 5 % 50 mL Inject 2 g into the vein daily.   02/03/17 at 0000  . citalopram (CELEXA) 10 MG tablet Take 1 tablet (10 mg total) by mouth at bedtime.   01/20/2017 at 0800  . DAKINS EX Apply 1 application topically. Everyday and every evening   01/20/2017 at pm  . feeding supplement, ENSURE ENLIVE, (ENSURE ENLIVE) LIQD Take 237 mLs by mouth 2 (two) times daily with a meal. 237 mL 12 01/20/2017 at pm  . folic acid (FOLVITE) 1 MG tablet Take 1 tablet (1 mg total) by mouth daily. 30 tablet 0 01/20/2017 at 0800  . furosemide (LASIX) 40 MG tablet Take 0.5  tablets (20 mg total) by mouth daily. 30 tablet  01/20/2017 at 0800  . metroNIDAZOLE (FLAGYL) 500 MG tablet Take 1 tablet (500 mg total) by mouth 3 (three) times daily. (Patient taking differently: Take 500 mg by mouth 3 (three) times daily. 4098,1191,4782) 90 tablet 1 01/20/2017 at 2000  . midodrine (PROAMATINE) 5 MG tablet Take 1 tablet (5 mg total) by mouth 3 (three) times daily with meals. (Patient taking differently: Take 5 mg by mouth 3 (three) times daily with meals. 0800,1200,1700)   01/20/2017 at 1700  . naproxen sodium (ALEVE) 220 MG tablet Take 440 mg by mouth 2 (two) times daily with a meal.   01/18/2017 at 0800  . oxyCODONE (OXY IR/ROXICODONE) 5 MG immediate release tablet Take 1 tablet (5 mg total) by mouth every 6 (six) hours as needed for moderate pain or severe pain. 30 tablet 0 prn at prn  . potassium chloride (K-DUR,KLOR-CON) 10 MEQ tablet Take 10 mEq by mouth daily.    01/20/2017 at 0800  . promethazine (PHENERGAN) 25 MG/ML injection Inject 25 mg into the vein once.   prn at prn  . sotalol (BETAPACE) 80 MG tablet Take 1 tablet (80 mg total) by mouth 2 (two)  times daily. (Patient taking differently: Take 80 mg by mouth 2 (two) times daily. 0800,1600) 60 tablet 0 01/20/2017 at 1600  . spironolactone (ALDACTONE) 25 MG tablet Take 0.5 tablets (12.5 mg total) by mouth daily.   01/20/2017 at 0800    Current medications: Current Facility-Administered Medications  Medication Dose Route Frequency Provider Last Rate Last Dose  . 0.9 %  sodium chloride infusion  250 mL Intravenous PRN Eugenie Norrieana G Blakeney, NP      . atropine injection 0.5 mg  0.5 mg Intravenous Once Myrna Blazeravid Matthew Schaevitz, MD      . cefTRIAXone (ROCEPHIN) IVPB 2 g  2 g Intravenous Q24H Shane CrutchPradeep Ramachandran, MD      . Melene Muller[START ON 01/22/2017] Chlorhexidine Gluconate Cloth 2 % PADS 6 each  6 each Topical Q0600 Eugenie Norrieana G Blakeney, NP      . citalopram (CELEXA) tablet 10 mg  10 mg Oral QHS Eugenie Norrieana G Blakeney, NP      . folic acid injection 1 mg  1 mg  Intravenous Daily Eugenie Norrieana G Blakeney, NP   1 mg at June 15, 2017 1647  . ipratropium-albuterol (DUONEB) 0.5-2.5 (3) MG/3ML nebulizer solution 3 mL  3 mL Nebulization Q6H PRN Eugenie Norrieana G Blakeney, NP      . metroNIDAZOLE (FLAGYL) tablet 500 mg  500 mg Oral TID Eugenie Norrieana G Blakeney, NP   500 mg at June 15, 2017 1655  . midodrine (PROAMATINE) tablet 5 mg  5 mg Oral TID WC Eugenie Norrieana G Blakeney, NP   5 mg at June 15, 2017 1654  . mupirocin ointment (BACTROBAN) 2 % 1 application  1 application Nasal BID Eugenie Norrieana G Blakeney, NP      . norepinephrine (LEVOPHED) 4 mg in dextrose 5 % 250 mL (0.016 mg/mL) infusion  0-40 mcg/min Intravenous Titrated Eugenie Norrieana G Blakeney, NP 37.5 mL/hr at June 15, 2017 1636 10 mcg/min at June 15, 2017 1636  . ondansetron (ZOFRAN) injection 4 mg  4 mg Intravenous Q6H PRN Eugenie Norrieana G Blakeney, NP          Allergies: Allergies  Allergen Reactions  . Clindamycin/Lincomycin Diarrhea      Past Medical History: Past Medical History:  Diagnosis Date  . Anal fissure   . Atrial fibrillation (HCC)   . CHF (congestive heart failure) (HCC)   . Heart murmur   . Hypertension   . Lymphadenopathy   . Personal history of colonic polyps   . Pressure ulcer, sacrum      Past Surgical History: Past Surgical History:  Procedure Laterality Date  . COLONOSCOPY  2012  . ESOPHAGOGASTRODUODENOSCOPY (EGD) WITH PROPOFOL N/A 12/18/2016   Procedure: ESOPHAGOGASTRODUODENOSCOPY (EGD) WITH PROPOFOL;  Surgeon: Toney Reilohini Reddy Vanga, MD;  Location: [facility] ENDOSCOPY;  Service: Endoscopy;  Laterality: N/A;  . HERNIA REPAIR  2012  . larynx-amyloidosis-laser surgery   2010     Family History: Family History  Problem Relation Age of Onset  . Hypertension Other   . Hypertension Brother      Social History: Social History   Social History  . Marital status: Divorced    Spouse name: N/A  . Number of children: N/A  . Years of education: N/A   Occupational History  . Not on file.   Social History Main Topics  . Smoking status: Current Every  Day Smoker    Packs/day: 1.00    Years: 20.00  . Smokeless tobacco: Never Used  . Alcohol use Yes  . Drug use: No  . Sexual activity: Not on file   Other Topics Concern  . Not on  file   Social History Narrative  . No narrative on file     Review of Systems: Review of Systems  Unable to perform ROS: Critical illness    Vital Signs: Blood pressure (!) 88/57, pulse 62, temperature 97.6 F (36.4 C), temperature source Oral, resp. rate 13, height 6\' 4"  (1.93 m), weight 133.8 kg (294 lb 15.6 oz), SpO2 98 %.  Weight trends: Filed Weights   02/03/2017 1024 02/07/2017 1600  Weight: 131.5 kg (290 lb) 133.8 kg (294 lb 15.6 oz)    Physical Exam: General: Critically ill  Head: Dry mucosal membranes  Eyes: Anicteric, PERRL  Neck: Supple, trachea midline  Lungs:  Clear to auscultation  Heart: Regular rate and rhythm  Abdomen:  Soft, nontender,   Extremities: 1+ peripheral edema.  Neurologic: Slow to answer commands  Skin: No lesions  GU Foley catheter placed     Lab results: Basic Metabolic Panel:  Recent Labs Lab 02/01/2017 1027  NA 138  K 4.5  CL 99*  CO2 27  GLUCOSE 100*  BUN 43*  CREATININE 6.14*  CALCIUM 8.5*    Liver Function Tests:  Recent Labs Lab 01/15/2017 1027  AST 31  ALT 17  ALKPHOS 102  BILITOT 1.1  PROT 6.5  ALBUMIN 2.0*    Recent Labs Lab 01/19/2017 1027  LIPASE 46   No results for input(s): AMMONIA in the last 168 hours.  CBC:  Recent Labs Lab 01/14/2017 1027  WBC 8.0  NEUTROABS 6.8*  HGB 13.1  HCT 39.2*  MCV 101.8*  PLT 122*    Cardiac Enzymes:  Recent Labs Lab 02/07/2017 1027  TROPONINI 0.06*    BNP: Invalid input(s): POCBNP  CBG:  Recent Labs Lab 02/02/2017 1536  GLUCAP 98    Microbiology: Results for orders placed or performed during the hospital encounter of 01/14/2017  C difficile quick scan w PCR reflex     Status: None   Collection Time: 01/24/2017 11:28 AM  Result Value Ref Range Status   C Diff antigen  NEGATIVE NEGATIVE Final   C Diff toxin NEGATIVE NEGATIVE Final   C Diff interpretation No C. difficile detected.  Final  Gastrointestinal Panel by PCR , Stool     Status: None   Collection Time: 02/07/2017 11:28 AM  Result Value Ref Range Status   Campylobacter species NOT DETECTED NOT DETECTED Final   Plesimonas shigelloides NOT DETECTED NOT DETECTED Final   Salmonella species NOT DETECTED NOT DETECTED Final   Yersinia enterocolitica NOT DETECTED NOT DETECTED Final   Vibrio species NOT DETECTED NOT DETECTED Final   Vibrio cholerae NOT DETECTED NOT DETECTED Final   Enteroaggregative E coli (EAEC) NOT DETECTED NOT DETECTED Final   Enteropathogenic E coli (EPEC) NOT DETECTED NOT DETECTED Final   Enterotoxigenic E coli (ETEC) NOT DETECTED NOT DETECTED Final   Shiga like toxin producing E coli (STEC) NOT DETECTED NOT DETECTED Final   Shigella/Enteroinvasive E coli (EIEC) NOT DETECTED NOT DETECTED Final   Cryptosporidium NOT DETECTED NOT DETECTED Final   Cyclospora cayetanensis NOT DETECTED NOT DETECTED Final   Entamoeba histolytica NOT DETECTED NOT DETECTED Final   Giardia lamblia NOT DETECTED NOT DETECTED Final   Adenovirus F40/41 NOT DETECTED NOT DETECTED Final   Astrovirus NOT DETECTED NOT DETECTED Final   Norovirus GI/GII NOT DETECTED NOT DETECTED Final   Rotavirus A NOT DETECTED NOT DETECTED Final   Sapovirus (I, II, IV, and V) NOT DETECTED NOT DETECTED Final    Coagulation Studies: No results for input(s):  LABPROT, INR in the last 72 hours.  Urinalysis:  Recent Labs  2017-02-10 1210  COLORURINE AMBER*  LABSPEC 1.025  PHURINE 5.0  GLUCOSEU 50*  HGBUR SMALL*  BILIRUBINUR NEGATIVE  KETONESUR 5*  PROTEINUR 30*  NITRITE NEGATIVE  LEUKOCYTESUR SMALL*      Imaging: Dg Chest 1 View  Result Date: Feb 10, 2017 CLINICAL DATA:  Chest pain and vomiting. EXAM: CHEST 1 VIEW COMPARISON:  One-view chest x-ray 12/27/2016 FINDINGS: The heart size is exaggerate by low lung volumes.  Moderate pulmonary vascular congestion is present. New left basilar airspace disease is present. The tip of a right-sided PICC line terminates at the cavoatrial junction. IMPRESSION: 1. Borderline cardiomegaly with increasing pulmonary vascular congestion. 2. New left basilar airspace disease likely reflects atelectasis. Infection is not excluded. Te Electronically Signed   By: Marin Roberts M.D.   On: 2017/02/10 10:55      Assessment & Plan: [name] is a 56 y.o. white male with decubitus ulcer, hypertension, congestive heart failure, atrial fibrillation, depression who was admitted to [facility] from 1/14 to 2/2 for sepsis. He was discharged with metronidazole and ceftriaxone for a total of six week. patient was admitted to [facility] on 02-10-2017   1. Acute renal failure: oliguric to anuric.  Will need renal replacement therapy. Discussed case with critical care team. Will get dialysis access and initiate dialysis.  Discussed case with common law wife. Prognosis is guarded at this time.   2. Sepsis: hypotension.  - empiric antibiotics - vasopressors  3. Cirrhosis with thrombocytopenia:      LOS: 0 Drianna Chandran 03-01-20184:58 PM

## 2017-01-21 NOTE — NC FL2 (Signed)
[place] MEDICAID FL2 LEVEL OF CARE SCREENING TOOL     IDENTIFICATION  Patient Name: [name] Birthdate: [birth year] Sex: male Admission Date (Current Location): 02/05/2017  [place] and [place] Number:  Chiropodist and Address:  [facility], [address], [place], [place] [address]      Provider Number: [id number]  Attending Physician Name and Address:  Shane Crutch, MD  Relative Name and Phone Number:       Current Level of Care: Hospital Recommended Level of Care: Skilled Nursing Facility Prior Approval Number:    Date Approved/Denied:   PASRR Number:   [id number]   Discharge Plan: SNF    Current Diagnoses: Patient Active Problem List   Diagnosis Date Noted  . Malnutrition of moderate degree 01/11/2017  . Decubitus ulcer of sacral region, stage 3 (HCC)   . Adjustment disorder with mixed anxiety and depressed mood 12/30/2016  . Sepsis (HCC) 12/27/2016  . Alcoholic cirrhosis of liver with ascites (HCC)   . Pressure injury of skin 12/16/2016  . Acute renal failure (ARF) (HCC) 12/15/2016  . Anemia 12/15/2016  . 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 Hyponatremia 04/17/2016    Orientation RESPIRATION BLADDER Height & Weight     Time, Situation, Place  Normal Incontinent Weight: 290 lb (131.5 kg) Height:  6\' 4"  (193 cm)  BEHAVIORAL SYMPTOMS/MOOD NEUROLOGICAL BOWEL NUTRITION STATUS      Incontinent Diet (Carb modified)  AMBULATORY STATUS COMMUNICATION OF NEEDS Skin   Total Care Verbally PU Stage and Appropriate Care                       Personal Care Assistance Level of Assistance  Bathing, Feeding, Dressing Bathing Assistance: Limited assistance Feeding assistance: Limited assistance Dressing Assistance: Limited assistance     Functional  Limitations Info  Sight, Hearing, Speech Sight Info: Impaired Hearing Info: Adequate Speech Info: Adequate    SPECIAL CARE FACTORS FREQUENCY  PT (By licensed PT)     PT Frequency: x5              Contractures Contractures Info: Not present    Additional Factors Info  Code Status, Allergies Code Status Info: full code Allergies Info: clindamycin,lincomycin           Current Medications (01/27/2017):  This is the current hospital active medication list Current Facility-Administered Medications  Medication Dose Route Frequency Provider Last Rate Last Dose  . 0.9 %  sodium chloride infusion  250 mL Intravenous PRN Eugenie Norrie, NP      . atropine injection 0.5 mg  0.5 mg Intravenous Once Myrna Blazer, MD      . ceFEPIme (MAXIPIME) 1 GM / 50mL IVPB premix  1 g Intravenous Q24H Olene Floss, RPH      . citalopram (CELEXA) tablet 10 mg  10 mg Oral QHS Eugenie Norrie, NP      . folic acid injection 1 mg  1 mg Intravenous Daily Eugenie Norrie, NP      . heparin injection 5,000 Units  5,000 Units Subcutaneous Q8H Eugenie Norrie, NP      . ipratropium-albuterol (DUONEB) 0.5-2.5 (3) MG/3ML nebulizer solution 3 mL  3 mL Nebulization Q6H PRN Eugenie Norrie, NP      . midodrine (PROAMATINE) tablet  5 mg  5 mg Oral TID WC Eugenie Norrieana G Blakeney, NP      . norepinephrine (LEVOPHED) 4 mg in dextrose 5 % 250 mL (0.016 mg/mL) infusion  0-40 mcg/min Intravenous Titrated Eugenie Norrieana G Blakeney, NP      . ondansetron [facility](ZOFRAN) injection 4 mg  4 mg Intravenous Q6H PRN Eugenie Norrieana G Blakeney, NP      . sodium chloride 0.9 % bolus 1,000 mL  1,000 mL Intravenous Once Myrna Blazeravid Matthew Schaevitz, MD       Current Outpatient Prescriptions  Medication Sig Dispense Refill  . benazepril (LOTENSIN) 40 MG tablet Take 1 tablet (40 mg total) by mouth daily. 30 tablet 0  . cefTRIAXone 2 g in dextrose 5 % 50 mL Inject 2 g into the vein daily.    . citalopram (CELEXA) 10 MG tablet Take 1 tablet (10 mg total) by  mouth at bedtime.    Marland Kitchen. DAKINS EX Apply 1 application topically. Everyday and every evening    . feeding supplement, ENSURE ENLIVE, (ENSURE ENLIVE) LIQD Take 237 mLs by mouth 2 (two) times daily with a meal. 237 mL 12  . folic acid (FOLVITE) 1 MG tablet Take 1 tablet (1 mg total) by mouth daily. 30 tablet 0  . furosemide (LASIX) 40 MG tablet Take 0.5 tablets (20 mg total) by mouth daily. 30 tablet   . metroNIDAZOLE (FLAGYL) 500 MG tablet Take 1 tablet (500 mg total) by mouth 3 (three) times daily. (Patient taking differently: Take 500 mg by mouth 3 (three) times daily. 0800,1400,2000) 90 tablet 1  . midodrine (PROAMATINE) 5 MG tablet Take 1 tablet (5 mg total) by mouth 3 (three) times daily with meals. (Patient taking differently: Take 5 mg by mouth 3 (three) times daily with meals. 9147,8295,62130800,1200,1700)    . naproxen sodium (ALEVE) 220 MG tablet Take 440 mg by mouth 2 (two) times daily with a meal.    . oxyCODONE (OXY IR/ROXICODONE) 5 MG immediate release tablet Take 1 tablet (5 mg total) by mouth every 6 (six) hours as needed for moderate pain or severe pain. 30 tablet 0  . potassium chloride (K-DUR,KLOR-CON) 10 MEQ tablet Take 10 mEq by mouth daily.     . promethazine (PHENERGAN) 25 MG/ML injection Inject 25 mg into the vein once.    . sotalol (BETAPACE) 80 MG tablet Take 1 tablet (80 mg total) by mouth 2 (two) times daily. (Patient taking differently: Take 80 mg by mouth 2 (two) times daily. 0800,1600) 60 tablet 0  . spironolactone (ALDACTONE) 25 MG tablet Take 0.5 tablets (12.5 mg total) by mouth daily.       Discharge Medications: Please see discharge summary for a list of discharge medications.  Relevant Imaging Results:  Relevant Lab Results:   Additional Information SSN [id number]   Cheron SchaumannBandi, Chemere Steffler M, [place]

## 2017-01-21 NOTE — Procedures (Signed)
Hemodialysis Catheter Insertion Procedure Note [name] RouteMichael J [name] [id number] [birth year]  Procedure: Insertion of Hemodialysis Catheter Indications: Dialysis Access   Procedure Details Consent: Risks of procedure as well as the alternatives and risks of each were explained to the (patient/caregiver).  Consent for procedure obtained. Time Out: Verified patient identification, verified procedure, site/side was marked, verified correct patient position, special equipment/implants available, medications/allergies/relevent history reviewed, required imaging and test results available.  Performed  Maximum sterile technique was used including antiseptics, cap, gloves, gown, hand hygiene, mask and sheet. Skin prep: Chlorhexidine; local anesthetic administered Double lumen hemodialysis catheter was inserted into right internal jugular vein using the Seldinger technique.  Evaluation Blood flow good Complications: No apparent complications Patient did tolerate procedure well. Chest X-ray ordered to verify placement.  CXR: pending.   Right internal jugular dual lumen dialysis catheter placed utilizing ultrasound no complications noted during or after procedure.  [name], [name]  Pulmonary/Critical Care Pager [phone] (please enter 7 digits) PCCM Consult Pager [phone] (please enter 7 digits)

## 2017-01-21 NOTE — Progress Notes (Signed)
Spoke with pts brother regarding pts request for the pts girlfriend [name] [name] to sign all informed consents. The pts brother who is next of kin states he is in agreement with this plan and confirms the pt does not have any children.  [name], [name]  Pulmonary/Critical Care Pager [phone] (please enter 7 digits) PCCM Consult Pager [phone] (please enter 7 digits)

## 2017-01-21 NOTE — Clinical Social Work Note (Addendum)
Clinical Social Work Assessment  Patient Details  Name: [name] RouteMichael J Andreatta MRN: [id number] Date of Birth: [birth year]  Date of referral:  2017-02-16               Reason for consult:  Facility Placement                Permission sought to share information with:    Carlos [name] [phone] Permission granted to share information::   All facilities  Name::        Agency::     Relationship::     Contact Information:     Housing/Transportation Living arrangements for the past 2 months:  Skilled Nursing Facility Source of Information:  Patient, Adult Children Patient Interpreter Needed:  None Criminal Activity/Legal Involvement Pertinent to Current Situation/Hospitalization:  No - Comment as needed Significant Relationships:  Spouse Lives with:  Significant Other Do you feel safe going back to the place where you live?    Need for family participation in patient care:  Yes (Comment)  Care giving concerns:  Would like a higher care facility for him   Social Worker assessment / plan: LCSW attempted to meet with patient but he was asleep. ED RN jostled patient awake, he was aware of my presence and nodded his head yes to speak to [name]  [phone]. LCSW was informed by ED RN he will be going to CCU. LCSW reviewed recent Fl2 and case notes and copied forward. Oriented x4, he struggles with walking since end of December, PT therapist has him sitting up but not walking yet. [facility] and has been declining and has very bad open wound ( tunnel wound) ( very deep and infected). He was brought to ED  Because of low blood pressure, and had nausea. Patient is incontinent due to immobility and used bed pan or diaper. Needs assistance with all ADL's and has been on high protein shakes ( recent  Weight loss from 343- down to 290)Kidney function is low and patient not urinating a lot( As per Spouse). Patient has [employer] PPO  Employment status:  Disabled (Comment on  whether or not currently receiving Disability) Insurance information:  Managed Care PT Recommendations:  Skilled Nursing Facility Information / Referral to community resources:     Patient/Family's Response to care:  Girlfriend  really wants a new facility and for him to be transfered  Patient/Family's Understanding of and Emotional Response to Diagnosis, Current Treatment, and Prognosis: She would really would like his open wounds to be healed and for him to go to LTAC.  Emotional Assessment Appearance:  Appears older than stated age Attitude/Demeanor/Rapport:   (polite, cooperative) Affect (typically observed):  Anxious Orientation:  Oriented to Self, Oriented to Place, Oriented to  Time, Oriented to Situation Alcohol / Substance use:  Alcohol Use Psych involvement (Current and /or in the community):  No (Comment)  Discharge Needs  Concerns to be addressed:  Care Coordination Readmission within the last 30 days: yes Current discharge risk:  None Barriers to Discharge:  Continued Medical Work up   [place] LakeBandi, Garlandlaudine M, LCSW 01/19/2017, 3:01 PM

## 2017-01-22 ENCOUNTER — Inpatient Hospital Stay: Admit: 2017-01-22 | Payer: BC Managed Care – PPO

## 2017-01-22 ENCOUNTER — Inpatient Hospital Stay: Payer: BC Managed Care – PPO

## 2017-01-22 DIAGNOSIS — I959 Hypotension, unspecified: Secondary | ICD-10-CM

## 2017-01-22 LAB — RENAL FUNCTION PANEL
ALBUMIN: 1.7 g/dL — AB (ref 3.5–5.0)
ALBUMIN: 1.8 g/dL — AB (ref 3.5–5.0)
ALBUMIN: 1.9 g/dL — AB (ref 3.5–5.0)
ANION GAP: 8 (ref 5–15)
ANION GAP: 7 (ref 5–15)
Albumin: 1.8 g/dL — ABNORMAL LOW (ref 3.5–5.0)
Albumin: 1.8 g/dL — ABNORMAL LOW (ref 3.5–5.0)
Albumin: 1.8 g/dL — ABNORMAL LOW (ref 3.5–5.0)
Anion gap: 9 (ref 5–15)
Anion gap: 10 (ref 5–15)
Anion gap: 10 (ref 5–15)
Anion gap: 8 (ref 5–15)
BUN: 38 mg/dL — AB (ref 6–20)
BUN: 37 mg/dL — ABNORMAL HIGH (ref 6–20)
BUN: 33 mg/dL — AB (ref 6–20)
BUN: 32 mg/dL — ABNORMAL HIGH (ref 6–20)
BUN: 31 mg/dL — AB (ref 6–20)
BUN: 29 mg/dL — AB (ref 6–20)
CALCIUM: 8.2 mg/dL — AB (ref 8.9–10.3)
CHLORIDE: 102 mmol/L (ref 101–111)
CHLORIDE: 98 mmol/L — AB (ref 101–111)
CHLORIDE: 102 mmol/L (ref 101–111)
CHLORIDE: 105 mmol/L (ref 101–111)
CO2: 25 mmol/L (ref 22–32)
CO2: 25 mmol/L (ref 22–32)
CO2: 24 mmol/L (ref 22–32)
CO2: 25 mmol/L (ref 22–32)
CO2: 27 mmol/L (ref 22–32)
CO2: 24 mmol/L (ref 22–32)
CREATININE: 4.08 mg/dL — AB (ref 0.61–1.24)
CREATININE: 4.22 mg/dL — AB (ref 0.61–1.24)
CREATININE: 3.77 mg/dL — AB (ref 0.61–1.24)
CREATININE: 3.29 mg/dL — AB (ref 0.61–1.24)
Calcium: 8 mg/dL — ABNORMAL LOW (ref 8.9–10.3)
Calcium: 7.9 mg/dL — ABNORMAL LOW (ref 8.9–10.3)
Calcium: 7.8 mg/dL — ABNORMAL LOW (ref 8.9–10.3)
Calcium: 8.1 mg/dL — ABNORMAL LOW (ref 8.9–10.3)
Calcium: 7.8 mg/dL — ABNORMAL LOW (ref 8.9–10.3)
Chloride: 103 mmol/L (ref 101–111)
Chloride: 103 mmol/L (ref 101–111)
Creatinine, Ser: 5.34 mg/dL — ABNORMAL HIGH (ref 0.61–1.24)
Creatinine, Ser: 5.03 mg/dL — ABNORMAL HIGH (ref 0.61–1.24)
GFR calc Af Amer: 13 mL/min — ABNORMAL LOW (ref >60)
GFR calc Af Amer: 19 mL/min — ABNORMAL LOW (ref >60)
GFR calc Af Amer: 23 mL/min — ABNORMAL LOW (ref >60)
GFR calc non Af Amer: 11 mL/min — ABNORMAL LOW (ref >60)
GFR calc non Af Amer: 12 mL/min — ABNORMAL LOW (ref >60)
GFR calc non Af Amer: 17 mL/min — ABNORMAL LOW (ref >60)
GFR calc non Af Amer: 19 mL/min — ABNORMAL LOW (ref >60)
GFR, EST AFRICAN AMERICAN: 14 mL/min — AB (ref >60)
GFR, EST AFRICAN AMERICAN: 17 mL/min — AB (ref >60)
GFR, EST AFRICAN AMERICAN: 17 mL/min — AB (ref >60)
GFR, EST NON AFRICAN AMERICAN: 15 mL/min — AB (ref >60)
GFR, EST NON AFRICAN AMERICAN: 14 mL/min — AB (ref >60)
GLUCOSE: 150 mg/dL — AB (ref 65–99)
GLUCOSE: 159 mg/dL — AB (ref 65–99)
GLUCOSE: 157 mg/dL — AB (ref 65–99)
Glucose, Bld: 147 mg/dL — ABNORMAL HIGH (ref 65–99)
Glucose, Bld: 336 mg/dL — ABNORMAL HIGH (ref 65–99)
Glucose, Bld: 162 mg/dL — ABNORMAL HIGH (ref 65–99)
PHOSPHORUS: 7 mg/dL — AB (ref 2.5–4.6)
PHOSPHORUS: 5.6 mg/dL — AB (ref 2.5–4.6)
PHOSPHORUS: 5.6 mg/dL — AB (ref 2.5–4.6)
PHOSPHORUS: 5 mg/dL — AB (ref 2.5–4.6)
POTASSIUM: 4 mmol/L (ref 3.5–5.1)
POTASSIUM: 3.7 mmol/L (ref 3.5–5.1)
POTASSIUM: 3.5 mmol/L (ref 3.5–5.1)
Phosphorus: 7.2 mg/dL — ABNORMAL HIGH (ref 2.5–4.6)
Phosphorus: 4.4 mg/dL (ref 2.5–4.6)
Potassium: 4 mmol/L (ref 3.5–5.1)
Potassium: 3.8 mmol/L (ref 3.5–5.1)
Potassium: 3.8 mmol/L (ref 3.5–5.1)
SODIUM: 137 mmol/L (ref 135–145)
Sodium: 136 mmol/L (ref 135–145)
Sodium: 136 mmol/L (ref 135–145)
Sodium: 132 mmol/L — ABNORMAL LOW (ref 135–145)
Sodium: 137 mmol/L (ref 135–145)
Sodium: 137 mmol/L (ref 135–145)

## 2017-01-22 LAB — APTT: APTT: 43 s — AB (ref 24–36)

## 2017-01-22 LAB — MAGNESIUM
MAGNESIUM: 1.1 mg/dL — AB (ref 1.7–2.4)
Magnesium: 1.7 mg/dL (ref 1.7–2.4)
Magnesium: 1.8 mg/dL (ref 1.7–2.4)
Magnesium: 1.8 mg/dL (ref 1.7–2.4)
Magnesium: 1.7 mg/dL (ref 1.7–2.4)
Magnesium: 1.5 mg/dL — ABNORMAL LOW (ref 1.7–2.4)

## 2017-01-22 LAB — PROCALCITONIN: Procalcitonin: 0.34 ng/mL

## 2017-01-22 LAB — CBC
HCT: 38.5 % — ABNORMAL LOW (ref 40.0–52.0)
HEMOGLOBIN: 12.5 g/dL — AB (ref 13.0–18.0)
MCH: 33.6 pg (ref 26.0–34.0)
MCHC: 32.5 g/dL (ref 32.0–36.0)
MCV: 103.4 fL — AB (ref 80.0–100.0)
Platelets: 125 10*3/uL — ABNORMAL LOW (ref 150–440)
RBC: 3.72 MIL/uL — AB (ref 4.40–5.90)
RDW: 16.7 % — ABNORMAL HIGH (ref 11.5–14.5)
WBC: 12.9 10*3/uL — AB (ref 3.8–10.6)

## 2017-01-22 LAB — AMMONIA: AMMONIA: 56 umol/L — AB (ref 9–35)

## 2017-01-22 LAB — URINE CULTURE: CULTURE: NO GROWTH

## 2017-01-22 LAB — TROPONIN I: TROPONIN I: 0.03 ng/mL — AB (ref <0.03)

## 2017-01-22 MED ORDER — ALTEPLASE 2 MG IJ SOLR
2.0000 mg | INTRAMUSCULAR | Status: AC | PRN
  Administered 2017-01-22 (×2): 2 mg
  Filled 2017-01-22 (×3): qty 2

## 2017-01-22 MED ORDER — STERILE WATER FOR INJECTION IJ SOLN
INTRAMUSCULAR | Status: AC
  Administered 2017-01-22: 10 mL
  Filled 2017-01-22: qty 10

## 2017-01-22 MED ORDER — METRONIDAZOLE IN NACL 5-0.79 MG/ML-% IV SOLN
500.0000 mg | Freq: Three times a day (TID) | INTRAVENOUS | Status: DC
  Administered 2017-01-22 – 2017-01-28 (×19): 500 mg via INTRAVENOUS
  Filled 2017-01-22 (×21): qty 100

## 2017-01-22 NOTE — Consult Note (Signed)
PHARMACY - CRITICAL CARE PROGRESS NOTE  Pharmacy Consult for medication adjustment for CRRT Indication: CRRT   Allergies  Allergen Reactions  . Clindamycin/Lincomycin Diarrhea    Patient Measurements: Height: 6\' 4"  (193 cm) Weight: (!) 300 lb 0.7 oz (136.1 kg) IBW/kg (Calculated) : 86.8 Adjusted Body Weight:   Vital Signs: Temp: 97.4 F (36.3 C) (02/10 0720) Temp Source: Axillary (02/10 0720) BP: 92/74 (02/10 0930) Pulse Rate: 47 (02/10 0900) Intake/Output from previous day: 02/09 0701 - 02/10 0700 In: 2972 [I.V.:672; IV Piggyback:2300] Out: 336 [Urine:20] Intake/Output from this shift: Total I/O In: 45 [I.V.:45] Out: 117 [Urine:20; Other:97] Vent settings for last 24 hours: FiO2 (%):  [3 %] 3 %  Labs:  Recent Labs  23-Jan-2017 1027  01-23-2017 1946 01/23/2017 2110 01/23/17 2248 01/22/17 0239 01/22/17 0535  WBC 8.0  --   --   --   --   --  12.9*  HGB 13.1  --   --   --   --   --  12.5*  HCT 39.2*  --   --   --   --   --  38.5*  PLT 122*  --   --   --   --   --  125*  APTT  --   --   --   --   --   --  43*  INR  --   --   --  1.33  --   --   --   CREATININE 6.14*  --  5.85*  --  5.64* 5.34* 5.03*  MG  --   < > 1.8  --  1.9 1.7 1.8  PHOS  --   < > 8.3*  --  7.8* 7.2* 7.0*  ALBUMIN 2.0*  --  1.8*  --  1.9* 1.8* 1.8*  PROT 6.5  --   --   --   --   --   --   AST 31  --  29  --   --   --   --   ALT 17  --  17  --   --   --   --   ALKPHOS 102  --   --   --   --   --   --   BILITOT 1.1  --   --   --   --   --   --   < > = values in this interval not displayed. Estimated Creatinine Clearance: 24.7 mL/min (by C-G formula based on SCr of 5.03 mg/dL (H)).   Recent Labs  01-23-2017 1536  GLUCAP 98    Microbiology: Recent Results (from the past 720 hour(s))  Culture, blood (routine x 2)     Status: Abnormal   Collection Time: 12/27/16 12:40 AM  Result Value Ref Range Status   Specimen Description BLOOD RIGHT HAND  Final   Special Requests   Final    BOTTLES DRAWN  AEROBIC AND ANAEROBIC AERO5ML,ANAERO4ML   Culture  Setup Time   Final    GRAM POSITIVE RODS ANAEROBIC BOTTLE ONLY CRITICAL VALUE NOTED.  VALUE IS CONSISTENT WITH PREVIOUSLY REPORTED AND CALLED VALUE.    Culture (A)  Final    ANAEROBIC GRAM POSITIVE RODS UNABLE TO FURTHER IDENTIFY. CORRECTED RESULTS CALLED TO: A ROBINSON,RN AT 1610 01/03/17 BY L BENFIELD PREVIOUSLY REPORTED AS DIPTHEROIDS Performed at [facility], [address], [place], [place] [address]    Report Status 01/03/2017 FINAL  Final  Culture, blood (  routine x 2)     Status: Abnormal   Collection Time: 12/27/16 12:40 AM  Result Value Ref Range Status   Specimen Description BLOOD LEFT HAND  Final   Special Requests   Final    BOTTLES DRAWN AEROBIC AND ANAEROBIC AERO3ML,ANAERO10ML   Culture  Setup Time   Final    GRAM POSITIVE RODS ANAEROBIC BOTTLE ONLY CRITICAL RESULT CALLED TO, READ BACK BY AND VERIFIED WITH: HANK ZOMPA 12/29/16 0900 SGD    Culture (A)  Final    ANAEROBIC GRAM POSITIVE RODS UNABLE TO FURTHER IDENTIFY. Performed at [facility], [address], [place], [place] [address]    Report Status 01/02/2017 FINAL  Final  Blood Culture ID Panel (Reflexed)     Status: None   Collection Time: 12/27/16 12:40 AM  Result Value Ref Range Status   Enterococcus species NOT DETECTED NOT DETECTED Final   Listeria monocytogenes NOT DETECTED NOT DETECTED Final   Staphylococcus species NOT DETECTED NOT DETECTED Final   Staphylococcus aureus NOT DETECTED NOT DETECTED Final   Streptococcus species NOT DETECTED NOT DETECTED Final   Streptococcus agalactiae NOT DETECTED NOT DETECTED Final   Streptococcus pneumoniae NOT DETECTED NOT DETECTED Final   Streptococcus pyogenes NOT DETECTED NOT DETECTED Final   Acinetobacter baumannii NOT DETECTED NOT DETECTED Final   Enterobacteriaceae species NOT DETECTED NOT DETECTED Final   Enterobacter cloacae complex NOT DETECTED NOT DETECTED Final   Escherichia coli NOT  DETECTED NOT DETECTED Final   Klebsiella oxytoca NOT DETECTED NOT DETECTED Final   Klebsiella pneumoniae NOT DETECTED NOT DETECTED Final   Proteus species NOT DETECTED NOT DETECTED Final   Serratia marcescens NOT DETECTED NOT DETECTED Final   Haemophilus influenzae NOT DETECTED NOT DETECTED Final   Neisseria meningitidis NOT DETECTED NOT DETECTED Final   Pseudomonas aeruginosa NOT DETECTED NOT DETECTED Final   Candida albicans NOT DETECTED NOT DETECTED Final   Candida glabrata NOT DETECTED NOT DETECTED Final   Candida krusei NOT DETECTED NOT DETECTED Final   Candida parapsilosis NOT DETECTED NOT DETECTED Final   Candida tropicalis NOT DETECTED NOT DETECTED Final  MRSA PCR Screening     Status: None   Collection Time: 12/27/16  6:30 AM  Result Value Ref Range Status   MRSA by PCR NEGATIVE NEGATIVE Final    Comment:        The GeneXpert MRSA Assay (FDA approved for NASAL specimens only), is one component of a comprehensive MRSA colonization surveillance program. It is not intended to diagnose MRSA infection nor to guide or monitor treatment for MRSA infections.   Urine culture     Status: None   Collection Time: 12/27/16 11:32 AM  Result Value Ref Range Status   Specimen Description URINE, CLEAN CATCH  Final   Special Requests NONE  Final   Culture   Final    NO GROWTH Performed at [facility], [address] [place], [place] [address]    Report Status 12/28/2016 FINAL  Final  Aerobic/Anaerobic Culture (surgical/deep wound)     Status: None   Collection Time: 01/02/17 10:23 AM  Result Value Ref Range Status   Specimen Description DECUBITIS  Final   Special Requests NONE  Final   Gram Stain   Final    MODERATE WBC PRESENT, PREDOMINANTLY PMN FEW GRAM POSITIVE COCCI IN PAIRS FEW GRAM POSITIVE RODS RARE GRAM NEGATIVE RODS    Culture   Final    MODERATE NORMAL SKIN FLORA FEW BACTEROIDES VULGATUS BETA  LACTAMASE POSITIVE Performed at [facility], [address] [place], [place] [address]    Report Status 01/07/2017 FINAL  Final  C difficile quick scan w PCR reflex     Status: None   Collection Time: 01/02/17 11:24 AM  Result Value Ref Range Status   C Diff antigen NEGATIVE NEGATIVE Final   C Diff toxin NEGATIVE NEGATIVE Final   C Diff interpretation No C. difficile detected.  Final  CULTURE, BLOOD (ROUTINE X 2) w Reflex to ID Panel     Status: None   Collection Time: 01/02/17  3:26 PM  Result Value Ref Range Status   Specimen Description BLOOD RIGHT HAND  Final   Special Requests   Final    BOTTLES DRAWN AEROBIC AND ANAEROBIC ANA14ML AER11ML   Culture NO GROWTH 5 DAYS  Final   Report Status 01/07/2017 FINAL  Final  CULTURE, BLOOD (ROUTINE X 2) w Reflex to ID Panel     Status: None   Collection Time: 01/02/17  3:27 PM  Result Value Ref Range Status   Specimen Description BLOOD LEFT ASSIST CONTROL  Final   Special Requests BOTTLES DRAWN AEROBIC AND ANAEROBIC AER9ML ANA8ML  Final   Culture NO GROWTH 5 DAYS  Final   Report Status 01/07/2017 FINAL  Final  Body fluid culture     Status: None   Collection Time: 01/03/17  2:25 PM  Result Value Ref Range Status   Specimen Description PERITONEAL  Final   Special Requests NONE  Final   Gram Stain   Final    RARE WBC PRESENT,BOTH PMN AND MONONUCLEAR NO ORGANISMS SEEN    Culture   Final    NO GROWTH 3 DAYS Performed at [facility], [address] [place], [place] [address]    Report Status 01/06/2017 FINAL  Final  Gastrointestinal Panel by PCR , Stool     Status: None   Collection Time: 01/11/17 10:34 AM  Result Value Ref Range Status   Campylobacter species NOT DETECTED NOT DETECTED Final   Plesimonas shigelloides NOT DETECTED NOT DETECTED Final   Salmonella species NOT DETECTED NOT DETECTED Final   Yersinia enterocolitica NOT DETECTED NOT DETECTED Final   Vibrio species NOT DETECTED NOT DETECTED Final   Vibrio cholerae NOT DETECTED NOT DETECTED Final   Enteroaggregative E  coli (EAEC) NOT DETECTED NOT DETECTED Final   Enteropathogenic E coli (EPEC) NOT DETECTED NOT DETECTED Final   Enterotoxigenic E coli (ETEC) NOT DETECTED NOT DETECTED Final   Shiga like toxin producing E coli (STEC) NOT DETECTED NOT DETECTED Final   Shigella/Enteroinvasive E coli (EIEC) NOT DETECTED NOT DETECTED Final   Cryptosporidium NOT DETECTED NOT DETECTED Final   Cyclospora cayetanensis NOT DETECTED NOT DETECTED Final   Entamoeba histolytica NOT DETECTED NOT DETECTED Final   Giardia lamblia NOT DETECTED NOT DETECTED Final   Adenovirus F40/41 NOT DETECTED NOT DETECTED Final   Astrovirus NOT DETECTED NOT DETECTED Final   Norovirus GI/GII NOT DETECTED NOT DETECTED Final   Rotavirus A NOT DETECTED NOT DETECTED Final   Sapovirus (I, II, IV, and V) NOT DETECTED NOT DETECTED Final  Blood Culture (routine x 2)     Status: None (Preliminary result)   Collection Time: 02/06/2017 10:27 AM  Result Value Ref Range Status   Specimen Description BLOOD LEFT ARM  Final   Special Requests BOTTLES DRAWN AEROBIC AND ANAEROBIC ANA2ML AER3ML  Final   Culture NO GROWTH < 24 HOURS  Final  Report Status PENDING  Incomplete  C difficile quick scan w PCR reflex     Status: None   Collection Time: 01/14/2017 11:28 AM  Result Value Ref Range Status   C Diff antigen NEGATIVE NEGATIVE Final   C Diff toxin NEGATIVE NEGATIVE Final   C Diff interpretation No C. difficile detected.  Final  Gastrointestinal Panel by PCR , Stool     Status: None   Collection Time: 01/22/2017 11:28 AM  Result Value Ref Range Status   Campylobacter species NOT DETECTED NOT DETECTED Final   Plesimonas shigelloides NOT DETECTED NOT DETECTED Final   Salmonella species NOT DETECTED NOT DETECTED Final   Yersinia enterocolitica NOT DETECTED NOT DETECTED Final   Vibrio species NOT DETECTED NOT DETECTED Final   Vibrio cholerae NOT DETECTED NOT DETECTED Final   Enteroaggregative E coli (EAEC) NOT DETECTED NOT DETECTED Final   Enteropathogenic  E coli (EPEC) NOT DETECTED NOT DETECTED Final   Enterotoxigenic E coli (ETEC) NOT DETECTED NOT DETECTED Final   Shiga like toxin producing E coli (STEC) NOT DETECTED NOT DETECTED Final   Shigella/Enteroinvasive E coli (EIEC) NOT DETECTED NOT DETECTED Final   Cryptosporidium NOT DETECTED NOT DETECTED Final   Cyclospora cayetanensis NOT DETECTED NOT DETECTED Final   Entamoeba histolytica NOT DETECTED NOT DETECTED Final   Giardia lamblia NOT DETECTED NOT DETECTED Final   Adenovirus F40/41 NOT DETECTED NOT DETECTED Final   Astrovirus NOT DETECTED NOT DETECTED Final   Norovirus GI/GII NOT DETECTED NOT DETECTED Final   Rotavirus A NOT DETECTED NOT DETECTED Final   Sapovirus (I, II, IV, and V) NOT DETECTED NOT DETECTED Final  Blood Culture (routine x 2)     Status: None (Preliminary result)   Collection Time: 01/13/2017 11:28 AM  Result Value Ref Range Status   Specimen Description BLOOD RIGHT PICC  Final   Special Requests BOTTLES DRAWN AEROBIC AND ANAEROBIC ANA7ML AER9ML  Final   Culture NO GROWTH < 24 HOURS  Final   Report Status PENDING  Incomplete  MRSA PCR Screening     Status: None   Collection Time: 01/29/2017  6:07 PM  Result Value Ref Range Status   MRSA by PCR NEGATIVE NEGATIVE Final    Comment:        The GeneXpert MRSA Assay (FDA approved for NASAL specimens only), is one component of a comprehensive MRSA colonization surveillance program. It is not intended to diagnose MRSA infection nor to guide or monitor treatment for MRSA infections.     Medications:  Scheduled:  . atropine  0.5 mg Intravenous Once  . cefTRIAXone  2 g Intravenous Q24H  . Chlorhexidine Gluconate Cloth  6 each Topical Q0600  . citalopram  10 mg Oral QHS  . fluconazole (DIFLUCAN) IV  400 mg Intravenous Q24H  . folic acid  1 mg Intravenous Daily  . metronidazole  500 mg Intravenous Q8H  . mupirocin ointment  1 application Nasal BID    Assessment: Pt is a 56 year old male who was admitted w/ an  infected sacral decubitius ulcer found to be in renal failure. Pt was d/c to [employer] of 2/2 on ceftiaxone and flagyl. Pt now in septic shock, hypotension and oliguira. Pt being initiated on CRRT. Pharmacy to adjust meds as needed.   Plan:  All medications are currently adjusted for CRRT. Fluconazole already adjusted for CRRT, no other meds currently require adjustment. Pharmacy to continue to monitor  [name], PharmD  Clinical Pharmacist  01/22/2017,10:02 AM

## 2017-01-22 NOTE — Progress Notes (Signed)
[facility]  ROUNDING NOTE   Subjective:   CRRT- clotted once overnight.  Unable to take PO midodrine.  Started on norepinephrine gtt.  UF 267mL.   Objective:  Vital signs in last 24 hours:  Temp:  [94.5 F (34.7 C)-97.6 F (36.4 C)] 97.4 F (36.3 C) (02/10 0720) Pulse Rate:  [44-66] 54 (02/10 1000) Resp:  [9-18] 11 (02/10 1000) BP: (51-103)/(25-83) 80/66 (02/10 1000) SpO2:  [90 %-100 %] 96 % (02/10 1000) FiO2 (%):  [3 %] 3 % (02/09 1600) Weight:  [131.5 kg (290 lb)-136.1 kg (300 lb 0.7 oz)] 136.1 kg (300 lb 0.7 oz) (02/10 0500)  Weight change:  Filed Weights   01/30/2017 1024 02/03/2017 1600 01/22/17 0500  Weight: 131.5 kg (290 lb) 133.8 kg (294 lb 15.6 oz) (!) 136.1 kg (300 lb 0.7 oz)    Intake/Output: I/O last 3 completed shifts: In: 2972 [I.V.:672; IV Piggyback:2300] Out: 336 [Urine:20; Other:316]   Intake/Output this shift:  Total I/O In: 135 [I.V.:135] Out: 190 [Urine:20; Other:170]  Physical Exam: General: Critically ill   Head: Normocephalic, atraumatic. Moist oral mucosal membranes  Eyes: Anicteric, PERRL  Neck: Supple, trachea midline  Lungs:  Clear to auscultation  Heart: Regular rate and rhythm  Abdomen:  Soft, nontender,   Extremities:  1+ peripheral edema.  Neurologic: Nonfocal, moving all four extremities  Skin: No lesions  Access: Right IJ temp HD catheter 2/9    Basic Metabolic Panel:  Recent Labs Lab 02/03/2017 1027 01/29/2017 1946 01/20/2017 2248 01/22/17 0239 01/22/17 0535  NA 138 133* 136 136 136  K 4.5 4.2 4.1 4.0 4.0  CL 99* 98* 100* 102 103  CO2 27 24 26 25 25   GLUCOSE 100* 144* 140* 150* 147*  BUN 43* 43* 42* 38* 37*  CREATININE 6.14* 5.85* 5.64* 5.34* 5.03*  CALCIUM 8.5* 7.8* 7.8* 8.0* 7.9*  MG  --  1.8 1.9 1.7 1.8  PHOS  --  8.3* 7.8* 7.2* 7.0*    Liver Function Tests:  Recent Labs Lab 01/19/2017 1027 01/30/2017 1946 02/01/2017 2248 01/22/17 0239 01/22/17 0535  AST 31 29  --   --   --   ALT 17 17  --   --    --   ALKPHOS 102  --   --   --   --   BILITOT 1.1  --   --   --   --   PROT 6.5  --   --   --   --   ALBUMIN 2.0* 1.8* 1.9* 1.8* 1.8*    Recent Labs Lab 02/06/2017 1027  LIPASE 46   No results for input(s): AMMONIA in the last 168 hours.  CBC:  Recent Labs Lab 01/29/2017 1027 01/22/17 0535  WBC 8.0 12.9*  NEUTROABS 6.8*  --   HGB 13.1 12.5*  HCT 39.2* 38.5*  MCV 101.8* 103.4*  PLT 122* 125*    Cardiac Enzymes:  Recent Labs Lab 01/28/2017 1027 01/15/2017 1642 02/07/2017 1946 01/23/2017 2248 01/22/17 0239  TROPONINI 0.06* 0.03* 0.04* 0.04* 0.03*    BNP: Invalid input(s): POCBNP  CBG:  Recent Labs Lab 01/29/2017 1536  GLUCAP 98    Microbiology: Results for orders placed or performed during the hospital encounter of 02/01/2017  Blood Culture (routine x 2)     Status: None (Preliminary result)   Collection Time: 01/18/2017 10:27 AM  Result Value Ref Range Status   Specimen Description BLOOD LEFT ARM  Final   Special Requests BOTTLES DRAWN AEROBIC AND ANAEROBIC  ANA2ML AER3ML  Final   Culture NO GROWTH < 24 HOURS  Final   Report Status PENDING  Incomplete  C difficile quick scan w PCR reflex     Status: None   Collection Time: 02-12-17 11:28 AM  Result Value Ref Range Status   C Diff antigen NEGATIVE NEGATIVE Final   C Diff toxin NEGATIVE NEGATIVE Final   C Diff interpretation No C. difficile detected.  Final  Gastrointestinal Panel by PCR , Stool     Status: None   Collection Time: 02-12-2017 11:28 AM  Result Value Ref Range Status   Campylobacter species NOT DETECTED NOT DETECTED Final   Plesimonas shigelloides NOT DETECTED NOT DETECTED Final   Salmonella species NOT DETECTED NOT DETECTED Final   Yersinia enterocolitica NOT DETECTED NOT DETECTED Final   Vibrio species NOT DETECTED NOT DETECTED Final   Vibrio cholerae NOT DETECTED NOT DETECTED Final   Enteroaggregative E coli (EAEC) NOT DETECTED NOT DETECTED Final   Enteropathogenic E coli (EPEC) NOT DETECTED NOT  DETECTED Final   Enterotoxigenic E coli (ETEC) NOT DETECTED NOT DETECTED Final   Shiga like toxin producing E coli (STEC) NOT DETECTED NOT DETECTED Final   Shigella/Enteroinvasive E coli (EIEC) NOT DETECTED NOT DETECTED Final   Cryptosporidium NOT DETECTED NOT DETECTED Final   Cyclospora cayetanensis NOT DETECTED NOT DETECTED Final   Entamoeba histolytica NOT DETECTED NOT DETECTED Final   Giardia lamblia NOT DETECTED NOT DETECTED Final   Adenovirus F40/41 NOT DETECTED NOT DETECTED Final   Astrovirus NOT DETECTED NOT DETECTED Final   Norovirus GI/GII NOT DETECTED NOT DETECTED Final   Rotavirus A NOT DETECTED NOT DETECTED Final   Sapovirus (I, II, IV, and V) NOT DETECTED NOT DETECTED Final  Blood Culture (routine x 2)     Status: None (Preliminary result)   Collection Time: 02-12-17 11:28 AM  Result Value Ref Range Status   Specimen Description BLOOD RIGHT PICC  Final   Special Requests BOTTLES DRAWN AEROBIC AND ANAEROBIC ANA7ML AER9ML  Final   Culture NO GROWTH < 24 HOURS  Final   Report Status PENDING  Incomplete  MRSA PCR Screening     Status: None   Collection Time: 2017-02-12  6:07 PM  Result Value Ref Range Status   MRSA by PCR NEGATIVE NEGATIVE Final    Comment:        The GeneXpert MRSA Assay (FDA approved for NASAL specimens only), is one component of a comprehensive MRSA colonization surveillance program. It is not intended to diagnose MRSA infection nor to guide or monitor treatment for MRSA infections.     Coagulation Studies:  Recent Labs  12-Feb-2017 2110  LABPROT 16.6*  INR 1.33    Urinalysis:  Recent Labs  2017/02/12 1210  COLORURINE AMBER*  LABSPEC 1.025  PHURINE 5.0  GLUCOSEU 50*  HGBUR SMALL*  BILIRUBINUR NEGATIVE  KETONESUR 5*  PROTEINUR 30*  NITRITE NEGATIVE  LEUKOCYTESUR SMALL*      Imaging: Dg Chest 1 View  Result Date: February 12, 2017 CLINICAL DATA:  Chest pain and vomiting. EXAM: CHEST 1 VIEW COMPARISON:  One-view chest x-ray 12/27/2016  FINDINGS: The heart size is exaggerate by low lung volumes. Moderate pulmonary vascular congestion is present. New left basilar airspace disease is present. The tip of a right-sided PICC line terminates at the cavoatrial junction. IMPRESSION: 1. Borderline cardiomegaly with increasing pulmonary vascular congestion. 2. New left basilar airspace disease likely reflects atelectasis. Infection is not excluded. Te Electronically Signed   By: Virl Son.D.  On: 02/03/2017 10:55   Dg Chest Port 1 View  Result Date: 01/22/2017 CLINICAL DATA:  Acute respiratory failure. EXAM: PORTABLE CHEST 1 VIEW COMPARISON:  01/31/2017. FINDINGS: PICC line and central venous IJ introducer remains stable, distal SVC. There is difficulty including the entire chest on the radiograph due to body habitus. The heart is enlarged. Diffuse hazy opacity RIGHT lung appears to be positional/technique related. LEFT basilar atelectasis appears stable. Early consolidation not excluded. LEFT pleural effusion. IMPRESSION: Stable exam.  Stable support apparatus. Electronically Signed   By: Elsie Stain M.D.   On: 01/22/2017 06:53   Dg Chest Port 1 View  Result Date: 01/22/2017 CLINICAL DATA:  Central line placement. EXAM: PORTABLE CHEST 1 VIEW COMPARISON:  02/08/2017 FINDINGS: The tip of the right PICC is not well visualized due to superimposed mediastinal and spine structures as well as underpenetration, though it likely terminates in the right atrium. A new right jugular catheter terminates over the lower SVC. The cardiac silhouette is mildly enlarged. Pulmonary vascular congestion is stable to mildly decreased. Lung volumes remain diminished with similar appearance of left basilar opacity. No large pleural effusion or pneumothorax is identified. IMPRESSION: 1. New right jugular catheter terminating over the lower SVC. 2. Stable to slightly improved pulmonary vascular congestion. 3. Unchanged left basilar atelectasis versus pneumonia.  Electronically Signed   By: Sebastian Ache M.D.   On: 01/28/2017 18:52     Medications:   . norepinephrine (LEVOPHED) Adult infusion 12 mcg/min (01/22/17 0910)  . pureflow 2,000 mL/hr at 01/30/2017 2114   . atropine  0.5 mg Intravenous Once  . cefTRIAXone  2 g Intravenous Q24H  . Chlorhexidine Gluconate Cloth  6 each Topical Q0600  . citalopram  10 mg Oral QHS  . fluconazole (DIFLUCAN) IV  400 mg Intravenous Q24H  . folic acid  1 mg Intravenous Daily  . metronidazole  500 mg Intravenous Q8H  . mupirocin ointment  1 application Nasal BID   sodium chloride, alteplase, heparin, ipratropium-albuterol, ondansetron (ZOFRAN) IV  Assessment/ Plan:  Mr. ELDIN BONSELL is a 56 y.o. white male with decubitus ulcer, hypertension, congestive heart failure, atrial fibrillation, depression who was admitted to [facility] from 1/14 to 2/2 for sepsis. He was discharged with metronidazole and ceftriaxone for a total of six weeks. Patient was admitted to [facility] on 02/07/2017 with sepsis.   1. Acute renal failure:  anuric. On vasopressors  Initiated CRRT on 2/9. CVVHD 2K bath. Therapy rate 2000, BFR 300 - increase UF to 75.  - renally dose all medications - monitor renal function, electrolytes, urine output and volume status.   2. Sepsis: hypotension. On vasopressor: norepineprine - empiric antibiotics: ceftriaxone and metronidazole. - since unable to take PO, discontinue midodrine.    LOS: 1 Zain Bingman 2/10/201810:20 AM

## 2017-01-22 NOTE — Plan of Care (Signed)
Problem: Education: Goal: Knowledge of [facility] Education information/materials will improve Outcome: Progressing Pt family was provided with welcome book. Pt family had questions answered and establised point person, and password.  Problem: Pain Managment: Goal: General experience of comfort will improve Outcome: Progressing Pt has had no complaints of pain on my shift. Pt's nausea relieved by Zofran x2.

## 2017-01-22 NOTE — Progress Notes (Signed)
Pharmacy Antibiotic Note  [name] is a 56 y.o. male admitted on 01/15/2017 with sepsis secondary to sacral decubitus ulcer.  Pharmacy has been consulted for fluconazole dosing. Patient  started on CRRT. Patient discharged to [facility] on 2/2 on ceftriaxone and metronidazole. These have been continued in house.   Plan: Will continue Fluconazole 400 mg IV Q24hr.  Height: 6\' 4"  (193 cm) Weight: (!) 300 lb 0.7 oz (136.1 kg) IBW/kg (Calculated) : 86.8  Temp (24hrs), Avg:96.2 F (35.7 C), Min:94.5 F (34.7 C), Max:97.6 F (36.4 C)   Recent Labs Lab 01/20/2017 1027 01/25/2017 1521 01/31/2017 1946 02/03/2017 2248 01/22/17 0239 01/22/17 0535  WBC 8.0  --   --   --   --  12.9*  CREATININE 6.14*  --  5.85* 5.64* 5.34* 5.03*  LATICACIDVEN 2.2* 2.4*  --   --   --   --     Estimated Creatinine Clearance: 24.7 mL/min (by C-G formula based on SCr of 5.03 mg/dL (H)).    Allergies  Allergen Reactions  . Clindamycin/Lincomycin Diarrhea    Antimicrobials this admission: Ceftriaxone  Metronidazole  Fluconazole.   Dose adjustments this admission: N/A  Microbiology results: Pending.   Pharmacy will continue to monitor and adjust per consult.   Shawnette Augello D 01/22/2017 10:03 AM

## 2017-01-22 NOTE — H&P (Signed)
PULMONARY / CRITICAL CARE MEDICINE   Name: [name] MRN: [id number] DOB: 1961-09-08    ADMISSION DATE:  01/25/2017   Summary:  Infected stage 3-4 sacral decubitus ulcer, and acute renal failure.  He was discharged to [facility] on 02/2 on IV antibiotic Ceftriaxone and oral Flagyl to infuse via a right upper arm PICC placed on 01/04/17.  Now returns with severe septic shock, hypotension, oliguria.    ASSESSMENT / PLAN:  PULMONARY A: Acute respiratory failure secondary to pulmonary vascular congestion, currently doing well from respiratory standpoint, on 3L .  CXR image reviewed; LLL atelectasis.   P:   Supplemental O2 to maintain O2 sats >92% Prn Bronchodilator therapy   CARDIOVASCULAR A:  Septic shock secondary to infected sacral decubitus ulcer; with hypotension.  Atrial Fibrillation-rate controlled Mildly elevated troponin's likely secondary to demand ischemia  Hx: HTN, Chronic diastolic CHF, and Heart Murmur P:  Continuous telemetry monitoring Trend troponin's Prn levophed gtt to maintain map >60, currently at 12 mics. Hold outpatient benazepril, lasix, sotalol, and spironolactone Continue outpatient midodrine  --Continue IVF.   RENAL A:   Acute renal failure with oliguria  P:   Trend BMP's Replace electrolytes as indicated Monitor UOP  Nephrology consulted appreciate input CRRT per nephrology recommendations Renal US pending Avoid nephrotoxic medications  GASTROINTESTINAL A:   Cirrhosis with continued drinking and smoking.  Hx: Anal Fissure P:   Renal diet with 1200 ml fluid restriction  HEMATOLOGIC A:   Thrombocytopenia  P:  SCD's for VTE prophylaxis Check PT/INR Trend CBC Monitor for s/sx of bleeding  INFECTIOUS A:   Septic shock secondary to Infected sacral decubitus ulcer P:   Trend WBC's and monitor fever curve Trend PCT's and lactic acid  Continue abx as listed above ID consulted appreciate input Wound consult  appreciate input May need surgical consult for debridement of pressure ulcer at some point  CULTURES: Urine 02/9>> GI Panel 02/9>>negative  Cdiff 02/9>>negative Blood x2 02/9>>  ANTIBIOTICS: Vancomycin 02/9>>x1 dose Zosyn 02/9>>x1 dose  Ceftriaxone 02/9>> Metronidazole 02/9>>  ENDOCRINE A:   No acute issues P:   Monitor serum glucose Hyper/Hypoglycemic protocol  NEUROLOGIC A:   No acute issues  P:   If pt develops acute pain will order prn morphine    -------------------------------------------------------------------------------  SUBJECTIVE:  Pt states he is mildly short of breath, no new complaints today.   REVIEW OF SYSTEMS:  Positives in BOLD Gen: Denies fever, chills, weight change, fatigue, night sweats HEENT: Denies blurred vision, double vision, hearing loss, tinnitus, sinus congestion, rhinorrhea, sore throat, neck stiffness, dysphagia PULM: shortness of breath, cough, sputum production, hemoptysis, wheezing CV: chest pain, edema, orthopnea, paroxysmal nocturnal dyspnea, palpitations GI: abdominal pain, nausea, vomiting, diarrhea, hematochezia, melena, constipation, change in bowel habits GU: dysuria, hematuria, polyuria, oliguria, urethral discharge Endocrine: Denies hot or cold intolerance, polyuria, polyphagia or appetite change Derm: Denies rash, dry skin, scaling or peeling skin change Heme: Denies easy bruising, bleeding, bleeding gums Neuro: Denies headache, numbness, weakness, slurred speech, loss of memory or consciousness    VITAL SIGNS: BP (!) 84/60   Pulse (!) 53   Temp 97.4 F (36.3 C) (Axillary)   Resp 12   Ht 6\' 4"  (1.93 m)   Wt (!) 300 lb 0.7 oz (136.1 kg)   SpO2 92%   BMI 36.52 kg/m   HEMODYNAMICS:    VENTILATOR SETTINGS: FiO2 (%):  [3 %] 3 %  INTAKE / OUTPUT: I/O last 3 completed shifts: In: 2972 [I.V.:672; IV Piggyback:2300]  Out: 336 [Urine:20; Other:316]  PHYSICAL EXAMINATION: General: acutely ill appearing  Caucasian male in no acute distress  Neuro: alert and oriented, follows commands, PERRLA HEENT: supple, no JVD Cardiovascular: irregular, irregular, no M/R/G Lungs: faint bilateral crackles at bases no wheezes, rhonchi, or rales present, even, non labored  Abdomen: hypoactive BS x4, soft, non tender, distended Musculoskeletal: moves all extremities, 2+ bilateral lower extremity edema  Skin: tunneled sacral decubitus ulcer with unknown depth measuring 51/2 x 6 with erythema wound edges excoriated, no rashes   LABS:  BMET  Recent Labs Lab 09-Feb-2017 2248 01/22/17 0239 01/22/17 0535  NA 136 136 136  K 4.1 4.0 4.0  CL 100* 102 103  CO2 26 25 25   BUN 42* 38* 37*  CREATININE 5.64* 5.34* 5.03*  GLUCOSE 140* 150* 147*    Electrolytes  Recent Labs Lab 02/06/2017 2248 01/22/17 0239 01/22/17 0535  CALCIUM 7.8* 8.0* 7.9*  MG 1.9 1.7 1.8  PHOS 7.8* 7.2* 7.0*    CBC  Recent Labs Lab February 09, 2017 1027 01/22/17 0535  WBC 8.0 12.9*  HGB 13.1 12.5*  HCT 39.2* 38.5*  PLT 122* 125*    Coag's  Recent Labs Lab 09-Feb-2017 2110 01/22/17 0535  APTT  --  43*  INR 1.33  --     Sepsis Markers  Recent Labs Lab 02/08/2017 1027 02/04/2017 1521 01/15/2017 1642 01/22/17 0239  LATICACIDVEN 2.2* 2.4*  --   --   PROCALCITON  --   --  0.27 0.34    ABG No results for input(s): PHART, PCO2ART, PO2ART in the last 168 hours.  Liver Enzymes  Recent Labs Lab February 09, 2017 1027 01/30/2017 1946 09-Feb-2017 2248 01/22/17 0239 01/22/17 0535  AST 31 29  --   --   --   ALT 17 17  --   --   --   ALKPHOS 102  --   --   --   --   BILITOT 1.1  --   --   --   --   ALBUMIN 2.0* 1.8* 1.9* 1.8* 1.8*    Cardiac Enzymes  Recent Labs Lab February 09, 2017 1946 01/29/2017 2248 01/22/17 0239  TROPONINI 0.04* 0.04* 0.03*    Glucose  Recent Labs Lab February 09, 2017 1536  GLUCAP 98    Imaging Dg Chest 1 View  Result Date: Feb 09, 2017 CLINICAL DATA:  Chest pain and vomiting. EXAM: CHEST 1 VIEW COMPARISON:  One-view  chest x-ray 12/27/2016 FINDINGS: The heart size is exaggerate by low lung volumes. Moderate pulmonary vascular congestion is present. New left basilar airspace disease is present. The tip of a right-sided PICC line terminates at the cavoatrial junction. IMPRESSION: 1. Borderline cardiomegaly with increasing pulmonary vascular congestion. 2. New left basilar airspace disease likely reflects atelectasis. Infection is not excluded. Te Electronically Signed   By: Marin Roberts M.D.   On: 01/29/2017 10:55   Dg Chest Port 1 View  Result Date: 01/22/2017 CLINICAL DATA:  Acute respiratory failure. EXAM: PORTABLE CHEST 1 VIEW COMPARISON:  09-Feb-2017. FINDINGS: PICC line and central venous IJ introducer remains stable, distal SVC. There is difficulty including the entire chest on the radiograph due to body habitus. The heart is enlarged. Diffuse hazy opacity RIGHT lung appears to be positional/technique related. LEFT basilar atelectasis appears stable. Early consolidation not excluded. LEFT pleural effusion. IMPRESSION: Stable exam.  Stable support apparatus. Electronically Signed   By: Elsie Stain M.D.   On: 01/22/2017 06:53   Dg Chest Port 1 View  Result Date: 02/08/2017 CLINICAL DATA:  Central  line placement. EXAM: PORTABLE CHEST 1 VIEW COMPARISON:  01/30/2017 FINDINGS: The tip of the right PICC is not well visualized due to superimposed mediastinal and spine structures as well as underpenetration, though it likely terminates in the right atrium. A new right jugular catheter terminates over the lower SVC. The cardiac silhouette is mildly enlarged. Pulmonary vascular congestion is stable to mildly decreased. Lung volumes remain diminished with similar appearance of left basilar opacity. No large pleural effusion or pneumothorax is identified. IMPRESSION: 1. New right jugular catheter terminating over the lower SVC. 2. Stable to slightly improved pulmonary vascular congestion. 3. Unchanged left basilar  atelectasis versus pneumonia. Electronically Signed   By: Sebastian AcheAllen  Grady M.D.   On: 01/22/2017 18:52   STUDIES: Renal US 02/9>>  SIGNIFICANT EVENTS: 02/9-[facility] with septic shock secondary to infected sacral decubitus ulcer requiring pressors, lactic acidosis, acute renal failure PCCM contacted to admit pt   LINES/TUBES: Right upper arm PICC 01/23>> LIJ HD catheter 2/10>>  FAMILY  - Updates: Pts wife updated about plan of care and questions answered 01/21/2017  - Inter-disciplinary family meet or Palliative Care meeting due by: 01/28/17   Wells Guileseep Yichen Gilardi, M.D. Pulmonary/Critical Care PCCM Consult Pager [phone] (please enter 7 digits)  01/22/2017   Critical Care Attestation.  I have personally obtained a history, examined the patient, evaluated laboratory and imaging results, formulated the assessment and plan and placed orders. The Patient requires high complexity decision making for assessment and support, frequent evaluation and titration of therapies, application of advanced monitoring technologies and extensive interpretation of multiple databases. The patient has critical illness that could lead imminently to failure of 1 or more organ systems and requires the highest level of physician preparedness to intervene.  Critical Care Time devoted to patient care services described in this note is 45 minutes and is exclusive of time spent in procedures.

## 2017-01-22 NOTE — Progress Notes (Signed)
CRRT machine alarmed at 0230. New cartridge set up. Discovered line clotted. NP called and updated. New orders given for Alteplase, PRN. Lines packed, will continue to assess.

## 2017-01-22 NOTE — Progress Notes (Signed)
Spoke with MD about central tele calling to say pt HR in the 30s. Have not observed that. Levo down to 10 mcg. Maintaining MAP above 65. Charge nurse wanted to know if pt needed a dopamine gtt and MD said not at this time, would rather watch pt since pt is stable. Also informed MD that ECHO called to report that pt had an ECHO last month with an EF of 45%. Will not repeat ECHO and told ECHO tech that it will not need to be repeated.

## 2017-01-22 NOTE — Progress Notes (Signed)
CRRT successfully restarted at 0513.

## 2017-01-23 LAB — RENAL FUNCTION PANEL
ALBUMIN: 1.9 g/dL — AB (ref 3.5–5.0)
ALBUMIN: 1.8 g/dL — AB (ref 3.5–5.0)
ALBUMIN: 1.7 g/dL — AB (ref 3.5–5.0)
ALBUMIN: 1.8 g/dL — AB (ref 3.5–5.0)
ANION GAP: 7 (ref 5–15)
ANION GAP: 5 (ref 5–15)
Albumin: 1.9 g/dL — ABNORMAL LOW (ref 3.5–5.0)
Albumin: 1.8 g/dL — ABNORMAL LOW (ref 3.5–5.0)
Anion gap: 7 (ref 5–15)
Anion gap: 5 (ref 5–15)
Anion gap: 6 (ref 5–15)
Anion gap: 5 (ref 5–15)
BUN: 29 mg/dL — AB (ref 6–20)
BUN: 28 mg/dL — ABNORMAL HIGH (ref 6–20)
BUN: 27 mg/dL — AB (ref 6–20)
BUN: 27 mg/dL — AB (ref 6–20)
BUN: 25 mg/dL — ABNORMAL HIGH (ref 6–20)
BUN: 26 mg/dL — AB (ref 6–20)
CALCIUM: 8.4 mg/dL — AB (ref 8.9–10.3)
CALCIUM: 8.1 mg/dL — AB (ref 8.9–10.3)
CHLORIDE: 103 mmol/L (ref 101–111)
CO2: 25 mmol/L (ref 22–32)
CO2: 26 mmol/L (ref 22–32)
CO2: 26 mmol/L (ref 22–32)
CO2: 27 mmol/L (ref 22–32)
CO2: 28 mmol/L (ref 22–32)
CO2: 26 mmol/L (ref 22–32)
CREATININE: 2.94 mg/dL — AB (ref 0.61–1.24)
CREATININE: 2.63 mg/dL — AB (ref 0.61–1.24)
Calcium: 8.3 mg/dL — ABNORMAL LOW (ref 8.9–10.3)
Calcium: 8.4 mg/dL — ABNORMAL LOW (ref 8.9–10.3)
Calcium: 8.4 mg/dL — ABNORMAL LOW (ref 8.9–10.3)
Calcium: 8.2 mg/dL — ABNORMAL LOW (ref 8.9–10.3)
Chloride: 104 mmol/L (ref 101–111)
Chloride: 102 mmol/L (ref 101–111)
Chloride: 99 mmol/L — ABNORMAL LOW (ref 101–111)
Chloride: 104 mmol/L (ref 101–111)
Chloride: 105 mmol/L (ref 101–111)
Creatinine, Ser: 3.2 mg/dL — ABNORMAL HIGH (ref 0.61–1.24)
Creatinine, Ser: 3.19 mg/dL — ABNORMAL HIGH (ref 0.61–1.24)
Creatinine, Ser: 2.84 mg/dL — ABNORMAL HIGH (ref 0.61–1.24)
Creatinine, Ser: 2.87 mg/dL — ABNORMAL HIGH (ref 0.61–1.24)
GFR calc Af Amer: 23 mL/min — ABNORMAL LOW (ref >60)
GFR calc Af Amer: 23 mL/min — ABNORMAL LOW (ref >60)
GFR calc Af Amer: 26 mL/min — ABNORMAL LOW (ref >60)
GFR calc Af Amer: 27 mL/min — ABNORMAL LOW (ref >60)
GFR calc Af Amer: 30 mL/min — ABNORMAL LOW (ref >60)
GFR calc non Af Amer: 20 mL/min — ABNORMAL LOW (ref >60)
GFR calc non Af Amer: 22 mL/min — ABNORMAL LOW (ref >60)
GFR calc non Af Amer: 23 mL/min — ABNORMAL LOW (ref >60)
GFR calc non Af Amer: 23 mL/min — ABNORMAL LOW (ref >60)
GFR, EST AFRICAN AMERICAN: 27 mL/min — AB (ref >60)
GFR, EST NON AFRICAN AMERICAN: 20 mL/min — AB (ref >60)
GFR, EST NON AFRICAN AMERICAN: 26 mL/min — AB (ref >60)
GLUCOSE: 159 mg/dL — AB (ref 65–99)
GLUCOSE: 353 mg/dL — AB (ref 65–99)
GLUCOSE: 150 mg/dL — AB (ref 65–99)
Glucose, Bld: 159 mg/dL — ABNORMAL HIGH (ref 65–99)
Glucose, Bld: 155 mg/dL — ABNORMAL HIGH (ref 65–99)
Glucose, Bld: 155 mg/dL — ABNORMAL HIGH (ref 65–99)
PHOSPHORUS: 4 mg/dL (ref 2.5–4.6)
PHOSPHORUS: 3.6 mg/dL (ref 2.5–4.6)
PHOSPHORUS: 3.2 mg/dL (ref 2.5–4.6)
POTASSIUM: 3.4 mmol/L — AB (ref 3.5–5.1)
POTASSIUM: 3.6 mmol/L (ref 3.5–5.1)
Phosphorus: 4.3 mg/dL (ref 2.5–4.6)
Phosphorus: 3.6 mg/dL (ref 2.5–4.6)
Phosphorus: 3.3 mg/dL (ref 2.5–4.6)
Potassium: 3.6 mmol/L (ref 3.5–5.1)
Potassium: 3.5 mmol/L (ref 3.5–5.1)
Potassium: 3.8 mmol/L (ref 3.5–5.1)
Potassium: 3.8 mmol/L (ref 3.5–5.1)
SODIUM: 130 mmol/L — AB (ref 135–145)
SODIUM: 137 mmol/L (ref 135–145)
Sodium: 136 mmol/L (ref 135–145)
Sodium: 135 mmol/L (ref 135–145)
Sodium: 136 mmol/L (ref 135–145)
Sodium: 136 mmol/L (ref 135–145)

## 2017-01-23 LAB — CBC
HCT: 34 % — ABNORMAL LOW (ref 40.0–52.0)
HEMATOCRIT: 36.3 % — AB (ref 40.0–52.0)
HEMOGLOBIN: 12.2 g/dL — AB (ref 13.0–18.0)
Hemoglobin: 11.5 g/dL — ABNORMAL LOW (ref 13.0–18.0)
MCH: 34 pg (ref 26.0–34.0)
MCH: 34.1 pg — ABNORMAL HIGH (ref 26.0–34.0)
MCHC: 33.5 g/dL (ref 32.0–36.0)
MCHC: 33.7 g/dL (ref 32.0–36.0)
MCV: 101.4 fL — AB (ref 80.0–100.0)
MCV: 101.2 fL — ABNORMAL HIGH (ref 80.0–100.0)
Platelets: 94 10*3/uL — ABNORMAL LOW (ref 150–440)
Platelets: 88 10*3/uL — ABNORMAL LOW (ref 150–440)
RBC: 3.58 MIL/uL — ABNORMAL LOW (ref 4.40–5.90)
RBC: 3.36 MIL/uL — ABNORMAL LOW (ref 4.40–5.90)
RDW: 16.6 % — ABNORMAL HIGH (ref 11.5–14.5)
RDW: 16.4 % — ABNORMAL HIGH (ref 11.5–14.5)
WBC: 12.8 10*3/uL — ABNORMAL HIGH (ref 3.8–10.6)
WBC: 12.7 10*3/uL — ABNORMAL HIGH (ref 3.8–10.6)

## 2017-01-23 LAB — MAGNESIUM
MAGNESIUM: 1.7 mg/dL (ref 1.7–2.4)
MAGNESIUM: 1.6 mg/dL — AB (ref 1.7–2.4)
Magnesium: 1.7 mg/dL (ref 1.7–2.4)
Magnesium: 1.5 mg/dL — ABNORMAL LOW (ref 1.7–2.4)
Magnesium: 1.7 mg/dL (ref 1.7–2.4)
Magnesium: 1.6 mg/dL — ABNORMAL LOW (ref 1.7–2.4)

## 2017-01-23 LAB — BASIC METABOLIC PANEL
ANION GAP: 6 (ref 5–15)
BUN: 29 mg/dL — ABNORMAL HIGH (ref 6–20)
CHLORIDE: 102 mmol/L (ref 101–111)
CO2: 27 mmol/L (ref 22–32)
Calcium: 8.4 mg/dL — ABNORMAL LOW (ref 8.9–10.3)
Creatinine, Ser: 3.22 mg/dL — ABNORMAL HIGH (ref 0.61–1.24)
GFR calc non Af Amer: 20 mL/min — ABNORMAL LOW (ref >60)
GFR, EST AFRICAN AMERICAN: 23 mL/min — AB (ref >60)
GLUCOSE: 164 mg/dL — AB (ref 65–99)
Potassium: 3.5 mmol/L (ref 3.5–5.1)
Sodium: 135 mmol/L (ref 135–145)

## 2017-01-23 LAB — PHOSPHORUS: Phosphorus: 4.2 mg/dL (ref 2.5–4.6)

## 2017-01-23 LAB — OCCULT BLOOD X 1 CARD TO LAB, STOOL: Fecal Occult Bld: POSITIVE — AB

## 2017-01-23 LAB — PROCALCITONIN: Procalcitonin: 0.34 ng/mL

## 2017-01-23 LAB — APTT: APTT: 41 s — AB (ref 24–36)

## 2017-01-23 MED ORDER — HYDROCERIN EX CREA
TOPICAL_CREAM | Freq: Two times a day (BID) | CUTANEOUS | Status: DC
  Administered 2017-01-23 – 2017-01-25 (×4): via TOPICAL
  Administered 2017-01-25: 2 via TOPICAL
  Administered 2017-01-26 – 2017-02-03 (×10): via TOPICAL
  Administered 2017-02-04: 1 via TOPICAL
  Administered 2017-02-05 – 2017-02-06 (×2): via TOPICAL
  Administered 2017-02-07: 1 via TOPICAL
  Administered 2017-02-07 – 2017-02-08 (×3): via TOPICAL
  Filled 2017-01-23 (×2): qty 113

## 2017-01-23 MED ORDER — SODIUM CHLORIDE 0.9% FLUSH
10.0000 mL | Freq: Two times a day (BID) | INTRAVENOUS | Status: DC
  Administered 2017-01-23 – 2017-01-25 (×4): 10 mL
  Administered 2017-01-25: 30 mL
  Administered 2017-01-26: 10 mL
  Administered 2017-01-26: 20 mL
  Administered 2017-01-27: 10 mL
  Administered 2017-01-27: 40 mL
  Administered 2017-01-28 – 2017-01-31 (×7): 10 mL
  Administered 2017-02-01: 20 mL
  Administered 2017-02-01 – 2017-02-03 (×3): 10 mL
  Administered 2017-02-04: 20 mL
  Administered 2017-02-04: 10 mL
  Administered 2017-02-05: 20 mL
  Administered 2017-02-05 – 2017-02-06 (×2): 10 mL
  Administered 2017-02-06 – 2017-02-07 (×2): 20 mL
  Administered 2017-02-07 – 2017-02-09 (×4): 10 mL

## 2017-01-23 MED ORDER — PUREFLOW DIALYSIS SOLUTION
INTRAVENOUS | Status: DC
  Administered 2017-01-23: 3 via INTRAVENOUS_CENTRAL
  Administered 2017-01-23: 08:00:00 via INTRAVENOUS_CENTRAL
  Administered 2017-01-24: 3 via INTRAVENOUS_CENTRAL
  Administered 2017-01-24: 13:00:00 via INTRAVENOUS_CENTRAL
  Administered 2017-01-24 – 2017-01-25 (×2): 1000 mL via INTRAVENOUS_CENTRAL

## 2017-01-23 MED ORDER — ALUM & MAG HYDROXIDE-SIMETH 200-200-20 MG/5ML PO SUSP: 30.0000 mL | ORAL | Status: DC | PRN

## 2017-01-23 MED ORDER — ORAL CARE MOUTH RINSE
15.0000 mL | Freq: Two times a day (BID) | OROMUCOSAL | Status: DC
  Administered 2017-01-23 – 2017-02-08 (×18): 15 mL via OROMUCOSAL

## 2017-01-23 MED ORDER — MAGNESIUM SULFATE 2 GM/50ML IV SOLN
2.0000 g | Freq: Once | INTRAVENOUS | Status: AC
  Administered 2017-01-24: 2 g via INTRAVENOUS
  Filled 2017-01-23: qty 50

## 2017-01-23 MED ORDER — ENSURE ENLIVE PO LIQD
237.0000 mL | Freq: Three times a day (TID) | ORAL | Status: DC
  Administered 2017-01-23 – 2017-01-24 (×3): 237 mL via ORAL

## 2017-01-23 MED ORDER — SODIUM CHLORIDE 0.9% FLUSH: 10.0000 mL | INTRAVENOUS | Status: DC | PRN

## 2017-01-23 NOTE — Consult Note (Signed)
WOC Nurse wound consult note Reason for Consult: Stage 4 pressure injury to coccyx, areas previously noted by my partner K. Sanders on 01/14/17 on the bilateral buttocks have resolved and medical adhesive related skin injury (MARSI) to the periwound has resolved as well. Wound type:Pressure Pressure Injury POA: Yes Measurement: 6cm x 5.5cm x 4cm Wound bed: Beefy red, clean (70%), yellow/white/gray necrotic slough (30%) Drainage (amount, consistency, odor) serous to serosanguinous exudate on old dressing, small amount Periwound: intact, dry Dressing procedure/placement/frequency: I will suggest maintaining patient on a therapeutic mattress with low air loss feature as he does not initiate repositioning efforts independently. He is already on this surface as this is the standard ICU bed; I have requested we continue the therapy upon transfer to floor. I have provided Nursing with [facility] to be at or below a 30-degree angle to prevent injury due to shear forces. If he sits in a chair at his extended care facility, it is imperative that he not "slouch down" as that position has been known to contribute to this presentation of deep tissue injuries at the coccyx. Mr. Melissa NoonDameron has agreed to the provision of bilateral pressure redistribution boots for heel pressure injury prevention; bilateral heels are currently intact. The bilateral LEs are very dry and with indications of chronic venous insufficiency (hemosiderin staining), Clinical CEAP 4a.  I have provided Eucerin cream for twice daily moisturizing. As the wound is still > 25% with non-viable tissue, I will continue the twice daily NS gauze dressings. Perhaps at a later time, consideration for adjunctive therapy i.e., negative pressure wound therapy could be considered. Also, as surgery has followed in the past, if you wish to have them follow Mr. Cohenour during this admission, please consult. Thank you for inviting us to see this patient during this  admission. The WOC nursing team will not follow, but will remain available to this patient, the nursing, surgical and medical teams.  Please re-consult if needed. Thanks, Ladona MowLaurie Brightyn Mozer, MSN, RN, GNP, Hans EdenCWOCN, CWON-AP, FAAN  Pager# [phone]

## 2017-01-23 NOTE — Progress Notes (Signed)
Pt had 6 beat run of VTACH. MD paged to notify. Will continue to assess

## 2017-01-23 NOTE — Progress Notes (Signed)
Dr. Wynelle LinkKolluru returned page and was informed of low K. Changed K bath to 4K. New bags hung.

## 2017-01-23 NOTE — Progress Notes (Signed)
Pharmacy Antibiotic Note  [name] is a 56 y.o. male admitted on 02/04/2017 with sepsis secondary to sacral decubitus ulcer.  Pharmacy has been consulted for fluconazole dosing. Patient  started on CRRT. Patient discharged to [facility] on 2/2 on ceftriaxone and metronidazole. These have been continued in house.   Plan: Will continue Fluconazole 400 mg IV Q24hr.  Height: 6\' 4"  (193 cm) Weight: 298 lb 11.6 oz (135.5 kg) IBW/kg (Calculated) : 86.8  Temp (24hrs), Avg:96.5 F (35.8 C), Min:94.7 F (34.8 C), Max:97.4 F (36.3 C)   Recent Labs Lab Sep 22, 2017 1027 Sep 22, 2017 1521  01/22/17 0535  01/22/17 1844 01/22/17 2259 01/23/17 0240 01/23/17 0438 01/23/17 0618  WBC 8.0  --   --  12.9*  --   --   --   --  12.8*  --   CREATININE 6.14*  --   < > 5.03*  < > 3.77* 3.29* 3.20* 3.22* 3.19*  LATICACIDVEN 2.2* 2.4*  --   --   --   --   --   --   --   --   < > = values in this interval not displayed.  Estimated Creatinine Clearance: 38.9 mL/min (by C-G formula based on SCr of 3.19 mg/dL (H)).    Allergies  Allergen Reactions  . Clindamycin/Lincomycin Diarrhea    Antimicrobials this admission: Ceftriaxone  Metronidazole  Fluconazole.   Dose adjustments this admission: N/A  Microbiology results: Pending.   Pharmacy will continue to monitor and adjust per consult.   Solange Emry D 01/23/2017 11:13 AM

## 2017-01-23 NOTE — Plan of Care (Signed)
Problem: Pain Managment: Goal: General experience of comfort will improve Outcome: Progressing Patient has remained free from pain on my shift.   Problem: Skin Integrity: Goal: Risk for impaired skin integrity will decrease Outcome: Not Progressing Pt is refusing to turn every 2 hours. Pt has MASD on buttocks, and stage 4 Pressure Ulcer on sacrum.

## 2017-01-23 NOTE — Progress Notes (Signed)
PULMONARY / CRITICAL CARE MEDICINE   Name: [name] MRN: [id number] DOB: Jun 18, 1961    ADMISSION DATE:  02/13/17   Summary:  Infected stage 3-4 sacral decubitus ulcer, and acute renal failure.  He was discharged to [facility] on 02/2 on IV antibiotic Ceftriaxone and oral Flagyl to infuse via a right upper arm PICC placed on 01/04/17.  Now returns with severe septic shock, hypotension, oliguria.    ASSESSMENT / PLAN:  PULMONARY A: Acute respiratory failure secondary to pulmonary vascular congestion, currently doing well from respiratory standpoint, on 2L [place].    P:   Supplemental O2 to maintain O2 sats >92% Prn Bronchodilator therapy   CARDIOVASCULAR A:  Septic shock secondary to infected sacral decubitus ulcer; with hypotension.  Now on levophed @ 13 mcs.  Atrial Fibrillation-rate controlled Mildly elevated troponin's likely secondary to demand ischemia  Hx: HTN, Chronic diastolic CHF, and Heart Murmur P:  Continuous telemetry monitoring Trend troponin's Prn levophed gtt to maintain map >60. Hold outpatient benazepril, lasix, sotalol, and spironolactone Continue outpatient midodrine  --Continue IVF per nephro.    RENAL A:   Acute renal failure with oliguria  P:   Trend BMP's Replace electrolytes as indicated Monitor UOP  Nephrology consulted appreciate input CRRT per nephrology recommendations Renal US pending Avoid nephrotoxic medications  GASTROINTESTINAL A:   Cirrhosis with continued drinking and smoking.  Hx: Anal Fissure P:   Renal diet with 1200 ml fluid restriction  HEMATOLOGIC A:   Thrombocytopenia  P:  SCD's for VTE prophylaxis Trend CBC Monitor for s/sx of bleeding  INFECTIOUS A:   Septic shock secondary to Infected sacral decubitus ulcer P:   Trend WBC's and monitor fever curve Trend PCT's and lactic acid  Continue abx as listed above ID consulted appreciate input Wound consult appreciate input May need surgical  consult for debridement of pressure ulcer at some point  CULTURES: Urine 02/9>> GI Panel 02/9>>negative  Cdiff 02/9>>negative Blood x2 02/9>>  ANTIBIOTICS: Vancomycin 02/9>>x1 dose Zosyn 02/9>>x1 dose  Ceftriaxone 02/9>> Metronidazole 02/9>>  ENDOCRINE A:   No acute issues P:   Monitor serum glucose Hyper/Hypoglycemic protocol  NEUROLOGIC A:   No acute issues  P:   --  STUDIES: Renal US 02/9>>  SIGNIFICANT EVENTS: 02/9-Pt admitted to [facility] ICU with septic shock secondary to infected sacral decubitus ulcer requiring pressors, lactic acidosis, acute renal failure PCCM contacted to admit pt   LINES/TUBES: Right upper arm PICC 01/23>> LIJ HD catheter 2/10>>  -------------------------------------------------------------------------------  SUBJECTIVE:  Pt appears comfortable, no new complaints today.   REVIEW OF SYSTEMS:  Positives in BOLD Gen: Denies fever, chills, weight change, fatigue, night sweats HEENT: Denies blurred vision, double vision, hearing loss, tinnitus, sinus congestion, rhinorrhea, sore throat, neck stiffness, dysphagia PULM: shortness of breath, cough, sputum production, hemoptysis, wheezing CV: chest pain, edema, orthopnea, paroxysmal nocturnal dyspnea, palpitations GI: abdominal pain, nausea, vomiting, diarrhea, hematochezia, melena, constipation, change in bowel habits GU: dysuria, hematuria, polyuria, oliguria, urethral discharge Endocrine: Denies hot or cold intolerance, polyuria, polyphagia or appetite change Derm: Denies rash, dry skin, scaling or peeling skin change Heme: Denies easy bruising, bleeding, bleeding gums Neuro: Denies headache, numbness, weakness, slurred speech, loss of memory or consciousness    VITAL SIGNS: BP 96/77   Pulse (!) 53   Temp (!) 95.7 F (35.4 C) (Axillary)   Resp 11   Ht 6\' 4"  (1.93 m)   Wt 298 lb 11.6 oz (135.5 kg)   SpO2 100%   BMI 36.36 kg/m  HEMODYNAMICS:    VENTILATOR SETTINGS:    INTAKE /  OUTPUT: I/O last 3 completed shifts: In: 1771.1 [I.V.:1471.1; IV Piggyback:300] Out: 2054 [Urine:60; Other:1994]  PHYSICAL EXAMINATION: General: acutely ill appearing Caucasian male in no acute distress  Neuro: alert and oriented, follows commands, PERRLA HEENT: supple, no JVD Cardiovascular: irregular, irregular, no M/R/G Lungs: faint bilateral crackles at bases no wheezes, rhonchi, or rales present, even, non labored  Abdomen: hypoactive BS x4, soft, non tender, distended Musculoskeletal: moves all extremities, 2+ bilateral lower extremity edema  Skin: tunneled sacral decubitus ulcer with unknown depth measuring 51/2 x 6 with erythema wound edges excoriated, no rashes   LABS:  BMET  Recent Labs Lab 01/23/17 0240 01/23/17 0438 01/23/17 0618  NA 136 135 135  K 3.6 3.5 3.4*  CL 104 102 102  CO2 25 27 26   BUN 29* 29* 28*  CREATININE 3.20* 3.22* 3.19*  GLUCOSE 159* 164* 159*    Electrolytes  Recent Labs Lab 01/22/17 2259 01/23/17 0240 01/23/17 0438 01/23/17 0618  CALCIUM 7.8* 8.4* 8.4* 8.3*  MG 1.5* 1.7  --  1.7  PHOS 4.4 4.3 4.2 4.0    CBC  Recent Labs Lab Feb 14, 2017 1027 01/22/17 0535 01/23/17 0438  WBC 8.0 12.9* 12.8*  HGB 13.1 12.5* 12.2*  HCT 39.2* 38.5* 36.3*  PLT 122* 125* 94*    Coag's  Recent Labs Lab 14-Feb-2017 2110 01/22/17 0535 01/23/17 0438  APTT  --  43* 41*  INR 1.33  --   --     Sepsis Markers  Recent Labs Lab 02/14/17 1027 02-14-2017 1521 02-14-2017 1642 01/22/17 0239 01/23/17 0438  LATICACIDVEN 2.2* 2.4*  --   --   --   PROCALCITON  --   --  0.27 0.34 0.34    ABG No results for input(s): PHART, PCO2ART, PO2ART in the last 168 hours.  Liver Enzymes  Recent Labs Lab February 14, 2017 1027 02-14-17 1946  01/22/17 2259 01/23/17 0240 01/23/17 0618  AST 31 29  --   --   --   --   ALT 17 17  --   --   --   --   ALKPHOS 102  --   --   --   --   --   BILITOT 1.1  --   --   --   --   --   ALBUMIN 2.0* 1.8*  < > 1.8* 1.9* 1.8*  <  > = values in this interval not displayed.  Cardiac Enzymes  Recent Labs Lab 02-14-2017 1946 14-Feb-2017 2248 01/22/17 0239  TROPONINI 0.04* 0.04* 0.03*    Glucose  Recent Labs Lab Feb 14, 2017 1536  GLUCAP 98    Imaging US Renal  Result Date: 01/22/2017 CLINICAL DATA:  Acute renal failure EXAM: RENAL / URINARY TRACT ULTRASOUND COMPLETE COMPARISON:  CT 12/27/2016 FINDINGS: Right Kidney: Length: 13.1 cm. Mildly increased echotexture. No mass or hydronephrosis. Left Kidney: Length: 12.3 cm. Mildly increased echotexture. No mass or hydronephrosis. Bladder: Decompressed with Foley catheter in place. IMPRESSION: Mildly increased echotexture in the kidneys compatible with chronic medical renal disease. No hydronephrosis. Electronically Signed   By: Charlett Nose M.D.   On: 01/22/2017 11:30       Deep Nicholos Johns, M.D. Pulmonary/Critical Care PCCM Consult Pager [phone] (please enter 7 [phone])  01/23/2017   Critical Care Attestation.  I have personally obtained a history, examined the patient, evaluated laboratory and imaging results, formulated the assessment and plan and placed orders. The Patient requires high complexity decision  making for assessment and support, frequent evaluation and titration of therapies, application of advanced monitoring technologies and extensive interpretation of multiple databases. The patient has critical illness that could lead imminently to failure of 1 or more organ systems and requires the highest level of physician preparedness to intervene.  Critical Care Time devoted to patient care services described in this note is 45 minutes and is exclusive of time spent in procedures.

## 2017-01-23 NOTE — Progress Notes (Signed)
[facility]  ROUNDING NOTE   Subjective:   CRRT - tolerating treatment well. UF 1655. Switched to [employer] bath this morning.  Girlfriend at bedside.   Norepinephrine gtt  Objective:  Vital signs in last 24 hours:  Temp:  [94.7 F (34.8 C)-97.4 F (36.3 C)] 97.4 F (36.3 C) (02/11 1000) Pulse Rate:  [46-60] 53 (02/11 1100) Resp:  [9-18] 12 (02/11 1100) BP: (68-156)/(52-96) 85/63 (02/11 1100) SpO2:  [87 %-100 %] 99 % (02/11 1100) Weight:  [135.5 kg (298 lb 11.6 oz)] 135.5 kg (298 lb 11.6 oz) (02/11 0400)  Weight change: 3.957 kg (8 lb 11.6 oz) Filed Weights   02-15-17 1600 01/22/17 0500 01/23/17 0400  Weight: 133.8 kg (294 lb 15.6 oz) (!) 136.1 kg (300 lb 0.7 oz) 135.5 kg (298 lb 11.6 oz)    Intake/Output: I/O last 3 completed shifts: In: 2208.6 [I.V.:1608.6; IV Piggyback:600] Out: 2054 [Urine:60; Other:1994]   Intake/Output this shift:  Total I/O In: 146.4 [I.V.:146.4] Out: 307 [Urine:20; Other:287]  Physical Exam: General: Critically ill   Head: Normocephalic, atraumatic. Moist oral mucosal membranes  Eyes: Anicteric, PERRL  Neck: Supple, trachea midline  Lungs:  Clear to auscultation  Heart: Regular rate and rhythm  Abdomen:  Soft, nontender, obese  Extremities:  1+ peripheral edema.  Neurologic: Nonfocal, moving all four extremities  Skin: No lesions  Access: Right IJ temp HD catheter 2/9    Basic Metabolic Panel:  Recent Labs Lab 01/22/17 1442 01/22/17 1844 01/22/17 2259 01/23/17 0240 01/23/17 0438 01/23/17 0618  NA 137 137 137 136 135 135  K 3.8 3.8 3.5 3.6 3.5 3.4*  CL 102 103 105 104 102 102  CO2 25 27 24 25 27 26   GLUCOSE 162* 159* 157* 159* 164* 159*  BUN 32* 31* 29* 29* 29* 28*  CREATININE 4.22* 3.77* 3.29* 3.20* 3.22* 3.19*  CALCIUM 8.1* 8.2* 7.8* 8.4* 8.4* 8.3*  MG 1.8 1.7 1.5* 1.7  --  1.7  PHOS 5.6* 5.0* 4.4 4.3 4.2 4.0    Liver Function Tests:  Recent Labs Lab 2017-02-15 1027 15-Feb-2017 1946  01/22/17 1442  01/22/17 1844 01/22/17 2259 01/23/17 0240 01/23/17 0618  AST 31 29  --   --   --   --   --   --   ALT 17 17  --   --   --   --   --   --   ALKPHOS 102  --   --   --   --   --   --   --   BILITOT 1.1  --   --   --   --   --   --   --   PROT 6.5  --   --   --   --   --   --   --   ALBUMIN 2.0* 1.8*  < > 1.8* 1.9* 1.8* 1.9* 1.8*  < > = values in this interval not displayed.  Recent Labs Lab 02/15/2017 1027  LIPASE 46    Recent Labs Lab 01/22/17 1034  AMMONIA 56*    CBC:  Recent Labs Lab February 15, 2017 1027 01/22/17 0535 01/23/17 0438  WBC 8.0 12.9* 12.8*  NEUTROABS 6.8*  --   --   HGB 13.1 12.5* 12.2*  HCT 39.2* 38.5* 36.3*  MCV 101.8* 103.4* 101.4*  PLT 122* 125* 94*    Cardiac Enzymes:  Recent Labs Lab February 15, 2017 1027 02-15-2017 1642 02/15/2017 1946 2017-02-15 2248 01/22/17 0239  TROPONINI 0.06* 0.03*  0.04* 0.04* 0.03*    BNP: Invalid input(s): POCBNP  CBG:  Recent Labs Lab 02/08/2017 1536  GLUCAP 98    Microbiology: Results for orders placed or performed during the hospital encounter of 01/19/2017  Blood Culture (routine x 2)     Status: None (Preliminary result)   Collection Time: 02/05/2017 10:27 AM  Result Value Ref Range Status   Specimen Description BLOOD LEFT ARM  Final   Special Requests BOTTLES DRAWN AEROBIC AND ANAEROBIC ANA2ML AER3ML  Final   Culture NO GROWTH 2 DAYS  Final   Report Status PENDING  Incomplete  C difficile quick scan w PCR reflex     Status: None   Collection Time: 01/30/2017 11:28 AM  Result Value Ref Range Status   C Diff antigen NEGATIVE NEGATIVE Final   C Diff toxin NEGATIVE NEGATIVE Final   C Diff interpretation No C. difficile detected.  Final  Gastrointestinal Panel by PCR , Stool     Status: None   Collection Time: 01/30/2017 11:28 AM  Result Value Ref Range Status   Campylobacter species NOT DETECTED NOT DETECTED Final   Plesimonas shigelloides NOT DETECTED NOT DETECTED Final   Salmonella species NOT DETECTED NOT DETECTED  Final   Yersinia enterocolitica NOT DETECTED NOT DETECTED Final   Vibrio species NOT DETECTED NOT DETECTED Final   Vibrio cholerae NOT DETECTED NOT DETECTED Final   Enteroaggregative E coli (EAEC) NOT DETECTED NOT DETECTED Final   Enteropathogenic E coli (EPEC) NOT DETECTED NOT DETECTED Final   Enterotoxigenic E coli (ETEC) NOT DETECTED NOT DETECTED Final   Shiga like toxin producing E coli (STEC) NOT DETECTED NOT DETECTED Final   Shigella/Enteroinvasive E coli (EIEC) NOT DETECTED NOT DETECTED Final   Cryptosporidium NOT DETECTED NOT DETECTED Final   Cyclospora cayetanensis NOT DETECTED NOT DETECTED Final   Entamoeba histolytica NOT DETECTED NOT DETECTED Final   Giardia lamblia NOT DETECTED NOT DETECTED Final   Adenovirus F40/41 NOT DETECTED NOT DETECTED Final   Astrovirus NOT DETECTED NOT DETECTED Final   Norovirus GI/GII NOT DETECTED NOT DETECTED Final   Rotavirus A NOT DETECTED NOT DETECTED Final   Sapovirus (I, II, IV, and V) NOT DETECTED NOT DETECTED Final  Blood Culture (routine x 2)     Status: None (Preliminary result)   Collection Time: 02/07/2017 11:28 AM  Result Value Ref Range Status   Specimen Description BLOOD RIGHT PICC  Final   Special Requests BOTTLES DRAWN AEROBIC AND ANAEROBIC ANA7ML AER9ML  Final   Culture NO GROWTH 2 DAYS  Final   Report Status PENDING  Incomplete  Urine culture     Status: None   Collection Time: 01/13/2017 12:10 PM  Result Value Ref Range Status   Specimen Description URINE, RANDOM  Final   Special Requests NONE  Final   Culture   Final    NO GROWTH Performed at [facility], [address] [place], [place] [address]    Report Status 01/22/2017 FINAL  Final  MRSA PCR Screening     Status: None   Collection Time: 01/25/2017  6:07 PM  Result Value Ref Range Status   MRSA by PCR NEGATIVE NEGATIVE Final    Comment:        The GeneXpert MRSA Assay (FDA approved for NASAL specimens only), is one component of a comprehensive MRSA  colonization surveillance program. It is not intended to diagnose MRSA infection nor to guide or monitor treatment for MRSA infections.     Coagulation Studies:  Recent Labs  14-Feb-2017 2110  LABPROT 16.6*  INR 1.33    Urinalysis:  Recent Labs  2017-02-14 1210  COLORURINE AMBER*  LABSPEC 1.025  PHURINE 5.0  GLUCOSEU 50*  HGBUR SMALL*  BILIRUBINUR NEGATIVE  KETONESUR 5*  PROTEINUR 30*  NITRITE NEGATIVE  LEUKOCYTESUR SMALL*      Imaging: US Renal  Result Date: 01/22/2017 CLINICAL DATA:  Acute renal failure EXAM: RENAL / URINARY TRACT ULTRASOUND COMPLETE COMPARISON:  CT 12/27/2016 FINDINGS: Right Kidney: Length: 13.1 cm. Mildly increased echotexture. No mass or hydronephrosis. Left Kidney: Length: 12.3 cm. Mildly increased echotexture. No mass or hydronephrosis. Bladder: Decompressed with Foley catheter in place. IMPRESSION: Mildly increased echotexture in the kidneys compatible with chronic medical renal disease. No hydronephrosis. Electronically Signed   By: Charlett Nose M.D.   On: 01/22/2017 11:30   Dg Chest Port 1 View  Result Date: 01/22/2017 CLINICAL DATA:  Acute respiratory failure. EXAM: PORTABLE CHEST 1 VIEW COMPARISON:  2017/02/14. FINDINGS: PICC line and central venous IJ introducer remains stable, distal SVC. There is difficulty including the entire chest on the radiograph due to body habitus. The heart is enlarged. Diffuse hazy opacity RIGHT lung appears to be positional/technique related. LEFT basilar atelectasis appears stable. Early consolidation not excluded. LEFT pleural effusion. IMPRESSION: Stable exam.  Stable support apparatus. Electronically Signed   By: Elsie Stain M.D.   On: 01/22/2017 06:53   Dg Chest Port 1 View  Result Date: Feb 14, 2017 CLINICAL DATA:  Central line placement. EXAM: PORTABLE CHEST 1 VIEW COMPARISON:  Feb 14, 2017 FINDINGS: The tip of the right PICC is not well visualized due to superimposed mediastinal and spine structures as well as  underpenetration, though it likely terminates in the right atrium. A new right jugular catheter terminates over the lower SVC. The cardiac silhouette is mildly enlarged. Pulmonary vascular congestion is stable to mildly decreased. Lung volumes remain diminished with similar appearance of left basilar opacity. No large pleural effusion or pneumothorax is identified. IMPRESSION: 1. New right jugular catheter terminating over the lower SVC. 2. Stable to slightly improved pulmonary vascular congestion. 3. Unchanged left basilar atelectasis versus pneumonia. Electronically Signed   By: Sebastian Ache M.D.   On: 02-14-17 18:52     Medications:   . norepinephrine (LEVOPHED) Adult infusion 12 mcg/min (01/23/17 1041)  . pureflow 2,000 mL/hr at 01/23/17 0811   . atropine  0.5 mg Intravenous Once  . cefTRIAXone  2 g Intravenous Q24H  . Chlorhexidine Gluconate Cloth  6 each Topical Q0600  . citalopram  10 mg Oral QHS  . feeding supplement (ENSURE ENLIVE)  237 mL Oral TID BM  . fluconazole (DIFLUCAN) IV  400 mg Intravenous Q24H  . folic acid  1 mg Intravenous Daily  . metronidazole  500 mg Intravenous Q8H  . mupirocin ointment  1 application Nasal BID   sodium chloride, alteplase, heparin, ipratropium-albuterol, ondansetron (ZOFRAN) IV  Assessment/ Plan:  Mr. [name] is a 56 y.o. white male with decubitus ulcer, hypertension, congestive heart failure, atrial fibrillation, depression who was admitted to [facility] from 1/14 to 2/2 for sepsis. He was discharged with metronidazole and ceftriaxone for a total of six weeks. Patient was admitted to [facility] on 14-Feb-2017 with sepsis.   1. Acute renal failure:  anuric. On vasopressors. Baseline creatinine of 0.86 on 01/11/17 Initiated on CRRT on 2/9. CVVHD 4K bath. Therapy rate 2000, BFR 300, UF 75 - renally dose all medications - monitor renal function, electrolytes, urine output and volume status.  2. Sepsis: hypotension. On vasopressor: norepineprine.  Secondary to sacral decubitus ulcer infection.  - empiric antibiotics: ceftriaxone and metronidazole. - Continue supportive care.   3. Thrombocytopenia: monitor platelets.   4. Hyponatremia: improved with CRRT.    LOS: 2 Kahleb Mcclane 2/11/201811:30 AM

## 2017-01-23 NOTE — Progress Notes (Signed)
Spoke with Infection Control and pt does not have ESBL, only has positive beta lactamase in decub. Contact precautions discontinued.

## 2017-01-23 NOTE — Progress Notes (Signed)
Pt remains on CRRT. Pt had 2 BM that are dark green and tarry. Pt remains on Levophed at 13 mcg/kg. Pt had no output in foley cathter on my shift. Pt had no complaints of pain on my shift. Full assessment can be found in EPIC. Report given to oncoming nurse.

## 2017-01-23 NOTE — Consult Note (Signed)
PHARMACY - CRITICAL CARE PROGRESS NOTE  Pharmacy Consult for medication adjustment for CRRT Indication: CRRT   Allergies  Allergen Reactions  . Clindamycin/Lincomycin Diarrhea    Patient Measurements: Height: 6\' 4"  (193 cm) Weight: 298 lb 11.6 oz (135.5 kg) IBW/kg (Calculated) : 86.8 Adjusted Body Weight:   Vital Signs: Temp: 97.4 F (36.3 C) (02/11 1000) Temp Source: Axillary (02/11 1000) BP: 90/67 (02/11 1030) Pulse Rate: 55 (02/11 1030) Intake/Output from previous day: 02/10 0701 - 02/11 0700 In: 1554.3 [I.V.:1004.3; IV Piggyback:550] Out: 1718 [Urine:40] Intake/Output from this shift: Total I/O In: 146.4 [I.V.:146.4] Out: 234 [Urine:20; Other:214] Vent settings for last 24 hours:    Labs:  Recent Labs  01/23/2017 1027 01/17/2017 1946 01/20/2017 2110  01/22/17 0535  01/22/17 2259 01/23/17 0240 01/23/17 0438 01/23/17 0618  WBC 8.0  --   --   --  12.9*  --   --   --  12.8*  --   HGB 13.1  --   --   --  12.5*  --   --   --  12.2*  --   HCT 39.2*  --   --   --  38.5*  --   --   --  36.3*  --   PLT 122*  --   --   --  125*  --   --   --  94*  --   APTT  --   --   --   --  43*  --   --   --  41*  --   INR  --   --  1.33  --   --   --   --   --   --   --   CREATININE 6.14* 5.85*  --   < > 5.03*  < > 3.29* 3.20* 3.22* 3.19*  MG  --  1.8  --   < > 1.8  < > 1.5* 1.7  --  1.7  PHOS  --  8.3*  --   < > 7.0*  < > 4.4 4.3 4.2 4.0  ALBUMIN 2.0* 1.8*  --   < > 1.8*  < > 1.8* 1.9*  --  1.8*  PROT 6.5  --   --   --   --   --   --   --   --   --   AST 31 29  --   --   --   --   --   --   --   --   ALT 17 17  --   --   --   --   --   --   --   --   ALKPHOS 102  --   --   --   --   --   --   --   --   --   BILITOT 1.1  --   --   --   --   --   --   --   --   --   < > = values in this interval not displayed. Estimated Creatinine Clearance: 38.9 mL/min (by C-G formula based on SCr of 3.19 mg/dL (H)).   Recent Labs  01/15/2017 1536  GLUCAP 98    Microbiology: Recent Results  (from the past 720 hour(s))  Culture, blood (routine x 2)     Status: Abnormal   Collection Time: 12/27/16 12:40 AM  Result Value Ref Range Status   Specimen  Description BLOOD RIGHT HAND  Final   Special Requests   Final    BOTTLES DRAWN AEROBIC AND ANAEROBIC AERO5ML,ANAERO4ML   Culture  Setup Time   Final    GRAM POSITIVE RODS ANAEROBIC BOTTLE ONLY CRITICAL VALUE NOTED.  VALUE IS CONSISTENT WITH PREVIOUSLY REPORTED AND CALLED VALUE.    Culture (A)  Final    ANAEROBIC GRAM POSITIVE RODS UNABLE TO FURTHER IDENTIFY. CORRECTED RESULTS CALLED TO: A ROBINSON,RN AT 1610 01/03/17 BY L BENFIELD PREVIOUSLY REPORTED AS DIPTHEROIDS Performed at [facility], [address] [place], [place] [address]    Report Status 01/03/2017 FINAL  Final  Culture, blood (routine x 2)     Status: Abnormal   Collection Time: 12/27/16 12:40 AM  Result Value Ref Range Status   Specimen Description BLOOD LEFT HAND  Final   Special Requests   Final    BOTTLES DRAWN AEROBIC AND ANAEROBIC AERO3ML,ANAERO10ML   Culture  Setup Time   Final    GRAM POSITIVE RODS ANAEROBIC BOTTLE ONLY CRITICAL RESULT CALLED TO, READ BACK BY AND VERIFIED WITH: HANK ZOMPA 12/29/16 0900 SGD    Culture (A)  Final    ANAEROBIC GRAM POSITIVE RODS UNABLE TO FURTHER IDENTIFY. Performed at [facility], [address] [place], [place] [address]    Report Status 01/02/2017 FINAL  Final  Blood Culture ID Panel (Reflexed)     Status: None   Collection Time: 12/27/16 12:40 AM  Result Value Ref Range Status   Enterococcus species NOT DETECTED NOT DETECTED Final   Listeria monocytogenes NOT DETECTED NOT DETECTED Final   Staphylococcus species NOT DETECTED NOT DETECTED Final   Staphylococcus aureus NOT DETECTED NOT DETECTED Final   Streptococcus species NOT DETECTED NOT DETECTED Final   Streptococcus agalactiae NOT DETECTED NOT DETECTED Final   Streptococcus pneumoniae NOT DETECTED NOT DETECTED Final   Streptococcus pyogenes NOT  DETECTED NOT DETECTED Final   Acinetobacter baumannii NOT DETECTED NOT DETECTED Final   Enterobacteriaceae species NOT DETECTED NOT DETECTED Final   Enterobacter cloacae complex NOT DETECTED NOT DETECTED Final   Escherichia coli NOT DETECTED NOT DETECTED Final   Klebsiella oxytoca NOT DETECTED NOT DETECTED Final   Klebsiella pneumoniae NOT DETECTED NOT DETECTED Final   Proteus species NOT DETECTED NOT DETECTED Final   Serratia marcescens NOT DETECTED NOT DETECTED Final   Haemophilus influenzae NOT DETECTED NOT DETECTED Final   Neisseria meningitidis NOT DETECTED NOT DETECTED Final   Pseudomonas aeruginosa NOT DETECTED NOT DETECTED Final   Candida albicans NOT DETECTED NOT DETECTED Final   Candida glabrata NOT DETECTED NOT DETECTED Final   Candida krusei NOT DETECTED NOT DETECTED Final   Candida parapsilosis NOT DETECTED NOT DETECTED Final   Candida tropicalis NOT DETECTED NOT DETECTED Final  MRSA PCR Screening     Status: None   Collection Time: 12/27/16  6:30 AM  Result Value Ref Range Status   MRSA by PCR NEGATIVE NEGATIVE Final    Comment:        The GeneXpert MRSA Assay (FDA approved for NASAL specimens only), is one component of a comprehensive MRSA colonization surveillance program. It is not intended to diagnose MRSA infection nor to guide or monitor treatment for MRSA infections.   Urine culture     Status: None   Collection Time: 12/27/16 11:32 AM  Result Value Ref Range Status   Specimen Description URINE, CLEAN CATCH  Final   Special Requests NONE  Final   Culture   Final  NO GROWTH Performed at [facility], [address] [place], [place] [address]    Report Status 12/28/2016 FINAL  Final  Aerobic/Anaerobic Culture (surgical/deep wound)     Status: None   Collection Time: 01/02/17 10:23 AM  Result Value Ref Range Status   Specimen Description DECUBITIS  Final   Special Requests NONE  Final   Gram Stain   Final    MODERATE WBC PRESENT,  PREDOMINANTLY PMN FEW GRAM POSITIVE COCCI IN PAIRS FEW GRAM POSITIVE RODS RARE GRAM NEGATIVE RODS    Culture   Final    MODERATE NORMAL SKIN FLORA FEW BACTEROIDES VULGATUS BETA LACTAMASE POSITIVE Performed at [facility], [address] [place], [place] [address]    Report Status 01/07/2017 FINAL  Final  C difficile quick scan w PCR reflex     Status: None   Collection Time: 01/02/17 11:24 AM  Result Value Ref Range Status   C Diff antigen NEGATIVE NEGATIVE Final   C Diff toxin NEGATIVE NEGATIVE Final   C Diff interpretation No C. difficile detected.  Final  CULTURE, BLOOD (ROUTINE X 2) w Reflex to ID Panel     Status: None   Collection Time: 01/02/17  3:26 PM  Result Value Ref Range Status   Specimen Description BLOOD RIGHT HAND  Final   Special Requests   Final    BOTTLES DRAWN AEROBIC AND ANAEROBIC ANA14ML AER11ML   Culture NO GROWTH 5 DAYS  Final   Report Status 01/07/2017 FINAL  Final  CULTURE, BLOOD (ROUTINE X 2) w Reflex to ID Panel     Status: None   Collection Time: 01/02/17  3:27 PM  Result Value Ref Range Status   Specimen Description BLOOD LEFT ASSIST CONTROL  Final   Special Requests BOTTLES DRAWN AEROBIC AND ANAEROBIC AER9ML ANA8ML  Final   Culture NO GROWTH 5 DAYS  Final   Report Status 01/07/2017 FINAL  Final  Body fluid culture     Status: None   Collection Time: 01/03/17  2:25 PM  Result Value Ref Range Status   Specimen Description PERITONEAL  Final   Special Requests NONE  Final   Gram Stain   Final    RARE WBC PRESENT,BOTH PMN AND MONONUCLEAR NO ORGANISMS SEEN    Culture   Final    NO GROWTH 3 DAYS Performed at [facility], [address] [place], [place] [address]    Report Status 01/06/2017 FINAL  Final  Gastrointestinal Panel by PCR , Stool     Status: None   Collection Time: 01/11/17 10:34 AM  Result Value Ref Range Status   Campylobacter species NOT DETECTED NOT DETECTED Final   Plesimonas shigelloides NOT DETECTED NOT  DETECTED Final   Salmonella species NOT DETECTED NOT DETECTED Final   Yersinia enterocolitica NOT DETECTED NOT DETECTED Final   Vibrio species NOT DETECTED NOT DETECTED Final   Vibrio cholerae NOT DETECTED NOT DETECTED Final   Enteroaggregative E coli (EAEC) NOT DETECTED NOT DETECTED Final   Enteropathogenic E coli (EPEC) NOT DETECTED NOT DETECTED Final   Enterotoxigenic E coli (ETEC) NOT DETECTED NOT DETECTED Final   Shiga like toxin producing E coli (STEC) NOT DETECTED NOT DETECTED Final   Shigella/Enteroinvasive E coli (EIEC) NOT DETECTED NOT DETECTED Final   Cryptosporidium NOT DETECTED NOT DETECTED Final   Cyclospora cayetanensis NOT DETECTED NOT DETECTED Final   Entamoeba histolytica NOT DETECTED NOT DETECTED Final   Giardia lamblia NOT DETECTED NOT DETECTED Final  Adenovirus F40/41 NOT DETECTED NOT DETECTED Final   Astrovirus NOT DETECTED NOT DETECTED Final   Norovirus GI/GII NOT DETECTED NOT DETECTED Final   Rotavirus A NOT DETECTED NOT DETECTED Final   Sapovirus (I, II, IV, and V) NOT DETECTED NOT DETECTED Final  Blood Culture (routine x 2)     Status: None (Preliminary result)   Collection Time: 01/13/2017 10:27 AM  Result Value Ref Range Status   Specimen Description BLOOD LEFT ARM  Final   Special Requests BOTTLES DRAWN AEROBIC AND ANAEROBIC ANA2ML AER3ML  Final   Culture NO GROWTH 2 DAYS  Final   Report Status PENDING  Incomplete  C difficile quick scan w PCR reflex     Status: None   Collection Time: 02/07/2017 11:28 AM  Result Value Ref Range Status   C Diff antigen NEGATIVE NEGATIVE Final   C Diff toxin NEGATIVE NEGATIVE Final   C Diff interpretation No C. difficile detected.  Final  Gastrointestinal Panel by PCR , Stool     Status: None   Collection Time: 01/17/2017 11:28 AM  Result Value Ref Range Status   Campylobacter species NOT DETECTED NOT DETECTED Final   Plesimonas shigelloides NOT DETECTED NOT DETECTED Final   Salmonella species NOT DETECTED NOT DETECTED  Final   Yersinia enterocolitica NOT DETECTED NOT DETECTED Final   Vibrio species NOT DETECTED NOT DETECTED Final   Vibrio cholerae NOT DETECTED NOT DETECTED Final   Enteroaggregative E coli (EAEC) NOT DETECTED NOT DETECTED Final   Enteropathogenic E coli (EPEC) NOT DETECTED NOT DETECTED Final   Enterotoxigenic E coli (ETEC) NOT DETECTED NOT DETECTED Final   Shiga like toxin producing E coli (STEC) NOT DETECTED NOT DETECTED Final   Shigella/Enteroinvasive E coli (EIEC) NOT DETECTED NOT DETECTED Final   Cryptosporidium NOT DETECTED NOT DETECTED Final   Cyclospora cayetanensis NOT DETECTED NOT DETECTED Final   Entamoeba histolytica NOT DETECTED NOT DETECTED Final   Giardia lamblia NOT DETECTED NOT DETECTED Final   Adenovirus F40/41 NOT DETECTED NOT DETECTED Final   Astrovirus NOT DETECTED NOT DETECTED Final   Norovirus GI/GII NOT DETECTED NOT DETECTED Final   Rotavirus A NOT DETECTED NOT DETECTED Final   Sapovirus (I, II, IV, and V) NOT DETECTED NOT DETECTED Final  Blood Culture (routine x 2)     Status: None (Preliminary result)   Collection Time: 01/26/2017 11:28 AM  Result Value Ref Range Status   Specimen Description BLOOD RIGHT PICC  Final   Special Requests BOTTLES DRAWN AEROBIC AND ANAEROBIC ANA7ML AER9ML  Final   Culture NO GROWTH 2 DAYS  Final   Report Status PENDING  Incomplete  Urine culture     Status: None   Collection Time: 02/08/2017 12:10 PM  Result Value Ref Range Status   Specimen Description URINE, RANDOM  Final   Special Requests NONE  Final   Culture   Final    NO GROWTH Performed at [facility], [address], [place], [place] [address]    Report Status 01/22/2017 FINAL  Final  MRSA PCR Screening     Status: None   Collection Time: 01/24/2017  6:07 PM  Result Value Ref Range Status   MRSA by PCR NEGATIVE NEGATIVE Final    Comment:        The GeneXpert MRSA Assay (FDA approved for NASAL specimens only), is one component of a comprehensive MRSA  colonization surveillance program. It is not intended to diagnose MRSA infection nor to guide or monitor treatment  for MRSA infections.     Medications:  Scheduled:  . atropine  0.5 mg Intravenous Once  . cefTRIAXone  2 g Intravenous Q24H  . Chlorhexidine Gluconate Cloth  6 each Topical Q0600  . citalopram  10 mg Oral QHS  . feeding supplement (ENSURE ENLIVE)  237 mL Oral TID BM  . fluconazole (DIFLUCAN) IV  400 mg Intravenous Q24H  . folic acid  1 mg Intravenous Daily  . metronidazole  500 mg Intravenous Q8H  . mupirocin ointment  1 application Nasal BID    Assessment: Pt is a 56 year old male who was admitted w/ an infected sacral decubitius ulcer found to be in renal failure. Pt was d/c to [employer] of 2/2 on ceftiaxone and flagyl. Pt now in septic shock, hypotension and oliguira. Pt being initiated on CRRT. Pharmacy to adjust meds as needed.   Plan:  All medications are currently adjusted for CRRT. Fluconazole already adjusted for CRRT, no other meds currently require adjustment. Pharmacy to continue to monitor  Demetrius Charity, PharmD  Clinical Pharmacist  01/23/2017,11:14 AM

## 2017-01-23 NOTE — Progress Notes (Signed)
Paging MD second time, to notify of [facility]

## 2017-01-23 NOTE — Progress Notes (Signed)
Pt continue on CRRT. Does not like to be turned off left side. Has been turned on and off but returns to left side. Wound care nurse saw pt today. Nephrologist following pt and intensivist. Has been pain free, and no indications of pain or discomfort. Girlfriend and brother visited today and pt was aware of them. Remains alert but drowsy at times. HR has been in the 50s mostly for the day. Blood pressure still low and up and down. Levo being titrated accordingly. Prevalon boots and Eucerin cream added to treatment. Continue to monitor.

## 2017-01-24 DIAGNOSIS — R6520 Severe sepsis without septic shock: Secondary | ICD-10-CM

## 2017-01-24 LAB — ALBUMIN: Albumin: 1.7 g/dL — ABNORMAL LOW (ref 3.5–5.0)

## 2017-01-24 LAB — RENAL FUNCTION PANEL
ALBUMIN: 2.1 g/dL — AB (ref 3.5–5.0)
ALBUMIN: 2.1 g/dL — AB (ref 3.5–5.0)
ANION GAP: 4 — AB (ref 5–15)
ANION GAP: 6 (ref 5–15)
ANION GAP: 4 — AB (ref 5–15)
ANION GAP: 5 (ref 5–15)
Albumin: 1.7 g/dL — ABNORMAL LOW (ref 3.5–5.0)
Albumin: 1.7 g/dL — ABNORMAL LOW (ref 3.5–5.0)
Albumin: 2.1 g/dL — ABNORMAL LOW (ref 3.5–5.0)
Anion gap: 7 (ref 5–15)
BUN: 24 mg/dL — ABNORMAL HIGH (ref 6–20)
BUN: 25 mg/dL — ABNORMAL HIGH (ref 6–20)
BUN: 23 mg/dL — ABNORMAL HIGH (ref 6–20)
BUN: 22 mg/dL — ABNORMAL HIGH (ref 6–20)
BUN: 23 mg/dL — AB (ref 6–20)
CALCIUM: 8.6 mg/dL — AB (ref 8.9–10.3)
CALCIUM: 8.4 mg/dL — AB (ref 8.9–10.3)
CALCIUM: 8.6 mg/dL — AB (ref 8.9–10.3)
CHLORIDE: 105 mmol/L (ref 101–111)
CHLORIDE: 106 mmol/L (ref 101–111)
CHLORIDE: 104 mmol/L (ref 101–111)
CO2: 27 mmol/L (ref 22–32)
CO2: 26 mmol/L (ref 22–32)
CO2: 26 mmol/L (ref 22–32)
CO2: 24 mmol/L (ref 22–32)
CO2: 23 mmol/L (ref 22–32)
CREATININE: 2.12 mg/dL — AB (ref 0.61–1.24)
Calcium: 8.2 mg/dL — ABNORMAL LOW (ref 8.9–10.3)
Calcium: 8.7 mg/dL — ABNORMAL LOW (ref 8.9–10.3)
Chloride: 106 mmol/L (ref 101–111)
Chloride: 105 mmol/L (ref 101–111)
Creatinine, Ser: 2.49 mg/dL — ABNORMAL HIGH (ref 0.61–1.24)
Creatinine, Ser: 2.51 mg/dL — ABNORMAL HIGH (ref 0.61–1.24)
Creatinine, Ser: 2.21 mg/dL — ABNORMAL HIGH (ref 0.61–1.24)
Creatinine, Ser: 2.24 mg/dL — ABNORMAL HIGH (ref 0.61–1.24)
GFR calc Af Amer: 31 mL/min — ABNORMAL LOW (ref >60)
GFR calc non Af Amer: 27 mL/min — ABNORMAL LOW (ref >60)
GFR calc non Af Amer: 27 mL/min — ABNORMAL LOW (ref >60)
GFR calc non Af Amer: 32 mL/min — ABNORMAL LOW (ref >60)
GFR, EST AFRICAN AMERICAN: 32 mL/min — AB (ref >60)
GFR, EST AFRICAN AMERICAN: 37 mL/min — AB (ref >60)
GFR, EST AFRICAN AMERICAN: 36 mL/min — AB (ref >60)
GFR, EST AFRICAN AMERICAN: 38 mL/min — AB (ref >60)
GFR, EST NON AFRICAN AMERICAN: 31 mL/min — AB (ref >60)
GFR, EST NON AFRICAN AMERICAN: 33 mL/min — AB (ref >60)
Glucose, Bld: 146 mg/dL — ABNORMAL HIGH (ref 65–99)
Glucose, Bld: 147 mg/dL — ABNORMAL HIGH (ref 65–99)
Glucose, Bld: 163 mg/dL — ABNORMAL HIGH (ref 65–99)
Glucose, Bld: 178 mg/dL — ABNORMAL HIGH (ref 65–99)
Glucose, Bld: 180 mg/dL — ABNORMAL HIGH (ref 65–99)
PHOSPHORUS: 2.9 mg/dL (ref 2.5–4.6)
PHOSPHORUS: 2.9 mg/dL (ref 2.5–4.6)
PHOSPHORUS: 2.9 mg/dL (ref 2.5–4.6)
POTASSIUM: 3.7 mmol/L (ref 3.5–5.1)
POTASSIUM: 4.3 mmol/L (ref 3.5–5.1)
POTASSIUM: 4.4 mmol/L (ref 3.5–5.1)
Phosphorus: 2.8 mg/dL (ref 2.5–4.6)
Phosphorus: 2.9 mg/dL (ref 2.5–4.6)
Potassium: 3.8 mmol/L (ref 3.5–5.1)
Potassium: 4.5 mmol/L (ref 3.5–5.1)
SODIUM: 138 mmol/L (ref 135–145)
SODIUM: 136 mmol/L (ref 135–145)
SODIUM: 134 mmol/L — AB (ref 135–145)
SODIUM: 134 mmol/L — AB (ref 135–145)
Sodium: 136 mmol/L (ref 135–145)

## 2017-01-24 LAB — CBC
HCT: 33 % — ABNORMAL LOW (ref 40.0–52.0)
HEMOGLOBIN: 11 g/dL — AB (ref 13.0–18.0)
MCH: 34 pg (ref 26.0–34.0)
MCHC: 33.2 g/dL (ref 32.0–36.0)
MCV: 102.4 fL — AB (ref 80.0–100.0)
Platelets: 80 10*3/uL — ABNORMAL LOW (ref 150–440)
RBC: 3.23 MIL/uL — ABNORMAL LOW (ref 4.40–5.90)
RDW: 16.8 % — ABNORMAL HIGH (ref 11.5–14.5)
WBC: 11.7 10*3/uL — ABNORMAL HIGH (ref 3.8–10.6)

## 2017-01-24 LAB — BASIC METABOLIC PANEL
Anion gap: 6 (ref 5–15)
BUN: 24 mg/dL — ABNORMAL HIGH (ref 6–20)
CHLORIDE: 105 mmol/L (ref 101–111)
CO2: 25 mmol/L (ref 22–32)
CREATININE: 2.42 mg/dL — AB (ref 0.61–1.24)
Calcium: 8.3 mg/dL — ABNORMAL LOW (ref 8.9–10.3)
GFR calc non Af Amer: 28 mL/min — ABNORMAL LOW (ref >60)
GFR, EST AFRICAN AMERICAN: 33 mL/min — AB (ref >60)
Glucose, Bld: 143 mg/dL — ABNORMAL HIGH (ref 65–99)
Potassium: 3.7 mmol/L (ref 3.5–5.1)
Sodium: 136 mmol/L (ref 135–145)

## 2017-01-24 LAB — MAGNESIUM
MAGNESIUM: 2.1 mg/dL (ref 1.7–2.4)
MAGNESIUM: 1.7 mg/dL (ref 1.7–2.4)
Magnesium: 2 mg/dL (ref 1.7–2.4)
Magnesium: 1.9 mg/dL (ref 1.7–2.4)
Magnesium: 1.7 mg/dL (ref 1.7–2.4)

## 2017-01-24 LAB — CORTISOL: Cortisol, Plasma: 24.3 ug/dL

## 2017-01-24 LAB — APTT: APTT: 44 s — AB (ref 24–36)

## 2017-01-24 MED ORDER — MIDODRINE HCL 5 MG PO TABS
10.0000 mg | ORAL_TABLET | Freq: Two times a day (BID) | ORAL | Status: DC
  Administered 2017-01-24 – 2017-01-25 (×3): 10 mg via ORAL
  Filled 2017-01-24 (×3): qty 2

## 2017-01-24 MED ORDER — STERILE WATER FOR INJECTION IJ SOLN
INTRAMUSCULAR | Status: AC
  Administered 2017-01-24: 10 mL
  Filled 2017-01-24: qty 10

## 2017-01-24 MED ORDER — VASOPRESSIN 20 UNIT/ML IV SOLN
0.0300 [IU]/min | INTRAVENOUS | Status: DC
  Administered 2017-01-24: 0.03 [IU]/min via INTRAVENOUS
  Filled 2017-01-24 (×2): qty 2

## 2017-01-24 MED ORDER — IPRATROPIUM-ALBUTEROL 0.5-2.5 (3) MG/3ML IN SOLN
RESPIRATORY_TRACT | Status: AC
  Filled 2017-01-24: qty 3

## 2017-01-24 MED ORDER — PANTOPRAZOLE SODIUM 40 MG IV SOLR
40.0000 mg | INTRAVENOUS | Status: DC
  Administered 2017-01-24: 40 mg via INTRAVENOUS
  Filled 2017-01-24: qty 40

## 2017-01-24 MED ORDER — ALBUMIN HUMAN 25 % IV SOLN
12.5000 g | Freq: Two times a day (BID) | INTRAVENOUS | Status: AC
  Administered 2017-01-24 – 2017-01-27 (×6): 12.5 g via INTRAVENOUS
  Filled 2017-01-24 (×7): qty 50

## 2017-01-24 MED ORDER — NOREPINEPHRINE BITARTRATE 1 MG/ML IV SOLN
0.0000 ug/min | INTRAVENOUS | Status: DC
  Administered 2017-01-24: 25 ug/min via INTRAVENOUS
  Administered 2017-01-24: 18 ug/min via INTRAVENOUS
  Administered 2017-01-24: 25 ug/min via INTRAVENOUS
  Administered 2017-01-25: 15 ug/min via INTRAVENOUS
  Administered 2017-01-27: 5 ug/min via INTRAVENOUS
  Filled 2017-01-24 (×5): qty 16

## 2017-01-24 MED ORDER — HYDROCORTISONE NA SUCCINATE PF 100 MG IJ SOLR
50.0000 mg | Freq: Three times a day (TID) | INTRAMUSCULAR | Status: DC
  Administered 2017-01-24 – 2017-01-25 (×4): 50 mg via INTRAVENOUS
  Filled 2017-01-24 (×4): qty 2

## 2017-01-24 MED ORDER — IPRATROPIUM-ALBUTEROL 0.5-2.5 (3) MG/3ML IN SOLN
3.0000 mL | Freq: Four times a day (QID) | RESPIRATORY_TRACT | Status: DC
  Administered 2017-01-24 – 2017-01-28 (×15): 3 mL via RESPIRATORY_TRACT
  Filled 2017-01-24 (×16): qty 3

## 2017-01-24 NOTE — Progress Notes (Signed)
Dr. Bard HerbertSimmonds notified of Pt sustained hypotension. Systolic remaining in the 50's and 60's. Increased Levophed to 30 mcg/kg. Will continue to assess.

## 2017-01-24 NOTE — Progress Notes (Signed)
Remains on high dose vasopressors. No overt distress. + F/C but poorly oriented  Vitals:   01/24/17 1430 01/24/17 1444 01/24/17 1500 01/24/17 1600  BP: 118/84  110/87 105/76  Pulse: 90  90 89  Resp: 11  13 13   Temp:      TempSrc:      SpO2: 93% 95% 99% 92%  Weight:      Height:       Chronically ill appearing, NAD HEENT WNL Bilateral wheezes RRR s M NABS, soft Ext warm, Severe LE edema with chronic stasis changes  BMP Latest Ref Rng & Units 01/24/2017 01/24/2017 01/24/2017  Glucose 65 - 99 mg/dL 295(M163(H) 841(L147(H) 244(W143(H)  BUN 6 - 20 mg/dL 10(U23(H) 72(Z25(H) 36(U24(H)  Creatinine 0.61 - 1.24 mg/dL 4.40(H2.21(H) 4.74(Q2.51(H) 5.95(G2.42(H)  Sodium 135 - 145 mmol/L 136 138 136  Potassium 3.5 - 5.1 mmol/L 4.3 3.8 3.7  Chloride 101 - 111 mmol/L 106 106 105  CO2 22 - 32 mmol/L 26 26 25   Calcium 8.9 - 10.3 mg/dL 3.8(V8.7(L) 5.6(E8.6(L) 8.3(L)   CBC Latest Ref Rng & Units 01/24/2017 01/23/2017 01/23/2017  WBC 3.8 - 10.6 K/uL 11.7(H) 12.7(H) 12.8(H)  Hemoglobin 13.0 - 18.0 g/dL 11.0(L) 11.5(L) 12.2(L)  Hematocrit 40.0 - 52.0 % 33.0(L) 34.0(L) 36.3(L)  Platelets 150 - 440 K/uL 80(L) 88(L) 94(L)   CXR (02/10): appearance c/w R effusion  IMPRESSION: 1) Hypoxic respiratory failure 2) Probable R pleural effusion 3) Wheezing 4) Shock - likely septic in origin - increasing vasopressor requirements 5) Severe sepsis due to sacral pressure ulcer 6)  AKI, anuric. On CRRT 7) Alcoholic cirrhosis 8) Mild anemia 9) thrombocytopenia 10) Acute encephalopathy 11) Chronic, severe debilitation  PLAN/REC: 1) Continue supplemental O2 2) Change bronchodilators to scheduled 3) Begin midodrine, hydrocortisone, vasopressin 4) Wean norepinephrine to off for MAP > 65 mmHg 5) CRRT per Renal Service 6) Abx per ID service 7) I will meet with family tomorrow AM to address goals of care. They seem to have indicated to RN that they wish to limit the aggressiveness of our interventions  Billy Fischeravid Simonds, MD PCCM service Mobile [phone] Pager  [phone] 01/24/2017

## 2017-01-24 NOTE — Progress Notes (Signed)
Pharmacy Antibiotic Note/ CRRT Medication Dosing  [name] RouteMichael J Quirino is a 56 y.o. male admitted on 01/20/2017 with sepsis secondary to sacral decubitus ulcer.  Pharmacy has been consulted for fluconazole dosing. Patient  started on CRRT. Patient discharged to [facility] on 2/2 on ceftriaxone and metronidazole. These have been continued in house.   Plan: 1. Will continue Fluconazole 400 mg IV Q24hr.   2. No medications require renal adjustment at present. Will continue to follow.   Height: 6\' 4"  (193 cm) Weight: 292 lb 5.3 oz (132.6 kg) IBW/kg (Calculated) : 86.8  Temp (24hrs), Avg:96.8 F (36 C), Min:95.9 F (35.5 C), Max:97.4 F (36.3 C)   Recent Labs Lab September 06, 2017 1027 September 06, 2017 1521  01/22/17 0535  01/23/17 0438  01/23/17 1633 01/23/17 1818 01/23/17 2228 01/23/17 2229 01/24/17 0304 01/24/17 0630 01/24/17 1116  WBC 8.0  --   --  12.9*  --  12.8*  --   --   --   --  12.7*  --  11.7*  --   CREATININE 6.14*  --   < > 5.03*  < > 3.22*  < > 2.84* 2.87* 2.63*  --  2.49*  --  2.51*  LATICACIDVEN 2.2* 2.4*  --   --   --   --   --   --   --   --   --   --   --   --   < > = values in this interval not displayed.  Estimated Creatinine Clearance: 48.9 mL/min (by C-G formula based on SCr of 2.51 mg/dL (H)).    Allergies  Allergen Reactions  . Clindamycin/Lincomycin Diarrhea    Anti-infectives    Start     Dose/Rate Route Frequency Ordered Stop   01/22/17 1845  fluconazole (DIFLUCAN) IVPB 400 mg     400 mg 100 mL/hr over 120 Minutes Intravenous Every 24 hours September 06, 2017 1706     01/22/17 1800  ceFEPIme (MAXIPIME) 1 GM / 50mL IVPB premix  Status:  Discontinued     1 g 100 mL/hr over 30 Minutes Intravenous Every 24 hours September 06, 2017 1502 September 06, 2017 1513   01/22/17 1000  metroNIDAZOLE (FLAGYL) IVPB 500 mg     500 mg 100 mL/hr over 60 Minutes Intravenous Every 8 hours 01/22/17 0926     September 06, 2017 1800  cefTRIAXone (ROCEPHIN) 2 g in dextrose 5 % 50 mL IVPB  Status:  Discontinued     2 g 100 mL/hr over 30 Minutes Intravenous Every 24 hours September 06, 2017 1519 September 06, 2017 1537   September 06, 2017 1800  cefTRIAXone (ROCEPHIN) IVPB 2 g     2 g 100 mL/hr over 30 Minutes Intravenous Every 24 hours September 06, 2017 1538     September 06, 2017 1800  fluconazole (DIFLUCAN) IVPB 800 mg     800 mg 200 mL/hr over 120 Minutes Intravenous  Once September 06, 2017 1706 September 06, 2017 2335   September 06, 2017 1600  metroNIDAZOLE (FLAGYL) tablet 500 mg  Status:  Discontinued     500 mg Oral 3 times daily September 06, 2017 1515 01/22/17 0925   September 06, 2017 1515  cefTRIAXone (ROCEPHIN) 2 g in dextrose 5 % 50 mL IVPB  Status:  Discontinued     2 g 100 mL/hr over 30 Minutes Intravenous Every 24 hours September 06, 2017 1513 September 06, 2017 1519   September 06, 2017 1500  ceFEPIme (MAXIPIME) 1 GM / 50mL IVPB premix  Status:  Discontinued     1 g 100 mL/hr over 30 Minutes Intravenous Every 24 hours September 06, 2017 1410 September 06, 2017 1502   September 06, 2017 1115  piperacillin-tazobactam (ZOSYN)  IVPB 3.375 g     3.375 g 100 mL/hr over 30 Minutes Intravenous  Once 01/25/2017 1113 01/17/2017 1256   01/15/2017 1115  vancomycin (VANCOCIN) IVPB 1000 mg/200 mL premix     1,000 mg 200 mL/hr over 60 Minutes Intravenous  Once 01/18/2017 1113 01/20/2017 1333     Recent Results (from the past 240 hour(s))  Blood Culture (routine x 2)     Status: None (Preliminary result)   Collection Time: 02/04/2017 10:27 AM  Result Value Ref Range Status   Specimen Description BLOOD LEFT ARM  Final   Special Requests BOTTLES DRAWN AEROBIC AND ANAEROBIC ANA2ML AER3ML  Final   Culture NO GROWTH 3 DAYS  Final   Report Status PENDING  Incomplete  C difficile quick scan w PCR reflex     Status: None   Collection Time: 01/13/2017 11:28 AM  Result Value Ref Range Status   C Diff antigen NEGATIVE NEGATIVE Final   C Diff toxin NEGATIVE NEGATIVE Final   C Diff interpretation No C. difficile detected.  Final  Gastrointestinal Panel by PCR , Stool     Status: None   Collection Time: 01/31/2017 11:28 AM  Result Value Ref Range Status   Campylobacter  species NOT DETECTED NOT DETECTED Final   Plesimonas shigelloides NOT DETECTED NOT DETECTED Final   Salmonella species NOT DETECTED NOT DETECTED Final   Yersinia enterocolitica NOT DETECTED NOT DETECTED Final   Vibrio species NOT DETECTED NOT DETECTED Final   Vibrio cholerae NOT DETECTED NOT DETECTED Final   Enteroaggregative E coli (EAEC) NOT DETECTED NOT DETECTED Final   Enteropathogenic E coli (EPEC) NOT DETECTED NOT DETECTED Final   Enterotoxigenic E coli (ETEC) NOT DETECTED NOT DETECTED Final   Shiga like toxin producing E coli (STEC) NOT DETECTED NOT DETECTED Final   Shigella/Enteroinvasive E coli (EIEC) NOT DETECTED NOT DETECTED Final   Cryptosporidium NOT DETECTED NOT DETECTED Final   Cyclospora cayetanensis NOT DETECTED NOT DETECTED Final   Entamoeba histolytica NOT DETECTED NOT DETECTED Final   Giardia lamblia NOT DETECTED NOT DETECTED Final   Adenovirus F40/41 NOT DETECTED NOT DETECTED Final   Astrovirus NOT DETECTED NOT DETECTED Final   Norovirus GI/GII NOT DETECTED NOT DETECTED Final   Rotavirus A NOT DETECTED NOT DETECTED Final   Sapovirus (I, II, IV, and V) NOT DETECTED NOT DETECTED Final  Blood Culture (routine x 2)     Status: None (Preliminary result)   Collection Time: 02/06/2017 11:28 AM  Result Value Ref Range Status   Specimen Description BLOOD RIGHT PICC  Final   Special Requests BOTTLES DRAWN AEROBIC AND ANAEROBIC ANA7ML AER9ML  Final   Culture NO GROWTH 3 DAYS  Final   Report Status PENDING  Incomplete  Urine culture     Status: None   Collection Time: 01/31/2017 12:10 PM  Result Value Ref Range Status   Specimen Description URINE, RANDOM  Final   Special Requests NONE  Final   Culture   Final    NO GROWTH Performed at [facility], [address], [place], [place] [address]    Report Status 01/22/2017 FINAL  Final  MRSA PCR Screening     Status: None   Collection Time: 02/02/2017  6:07 PM  Result Value Ref Range Status   MRSA by PCR NEGATIVE NEGATIVE  Final    Comment:        The GeneXpert MRSA Assay (FDA approved for NASAL specimens only), is one component of a comprehensive MRSA colonization  surveillance program. It is not intended to diagnose MRSA infection nor to guide or monitor treatment for MRSA infections.      Pharmacy will continue to monitor and adjust per consult.   Luisa Hart D 01/24/2017 1:40 PM

## 2017-01-24 NOTE — Progress Notes (Signed)
[facility]  ROUNDING NOTE   Subjective:    remains on CRRT UOP 30 c Net UF 1600 cc with CRRT Currently UF 75 cc/hr BP is low in 70's Vasopressors and Norepinephrine   Objective:  Vital signs in last 24 hours:  Temp:  [95.9 F (35.5 C)-97.4 F (36.3 C)] 96.6 F (35.9 C) (02/12 0400) Pulse Rate:  [53-90] 67 (02/12 1000) Resp:  [0-21] 12 (02/12 1000) BP: (64-102)/(43-80) 86/71 (02/12 1000) SpO2:  [92 %-100 %] 92 % (02/12 1000) Weight:  [132.6 kg (292 lb 5.3 oz)] 132.6 kg (292 lb 5.3 oz) (02/12 0500)  Weight change: -2.9 kg (-6 lb 6.3 oz) Filed Weights   01/22/17 0500 01/23/17 0400 01/24/17 0500  Weight: (!) 136.1 kg (300 lb 0.7 oz) 135.5 kg (298 lb 11.6 oz) 132.6 kg (292 lb 5.3 oz)    Intake/Output: I/O last 3 completed shifts: In: 2306.4 [I.V.:1556.4; IV Piggyback:750] Out: 2568 [Urine:30; Other:2538]   Intake/Output this shift:  Total I/O In: -  Out: 143 [Other:143]  Physical Exam: General: Critically ill   Head: Normocephalic, atraumatic. Moist oral mucosal membranes  Eyes: Anicteric,    Neck: Supple,   Lungs:  Clear to auscultation  Heart: Regular rate and rhythm  Abdomen:  Soft, nontender, obese, distended   Extremities:  2-3+ dependent peripheral edema.  Neurologic: Lethargic but arousable, answers yes/no to simple questions  Skin: Scattered bruising  Access: Right IJ temp HD catheter 2/9    Basic Metabolic Panel:  Recent Labs Lab 01/23/17 1136 01/23/17 1633 01/23/17 1818 01/23/17 2228 01/23/17 2229 01/24/17 0304  NA 130* 136 137 136  --  136  K 3.5 3.6 3.8 3.8  --  3.7  CL 99* 103 104 105  --  105  CO2 26 27 28 26   --  27  GLUCOSE 353* 150* 155* 155*  --  146*  BUN 27* 27* 25* 26*  --  24*  CREATININE 2.94* 2.84* 2.87* 2.63*  --  2.49*  CALCIUM 8.1* 8.4* 8.4* 8.2*  --  8.2*  MG 1.5* 1.7 1.6*  --  1.6* 2.1  PHOS 3.6 3.6 3.3 3.2  --  2.9    Liver Function Tests:  Recent Labs Lab 04-28-2017 1027 04-28-2017 1946   01/23/17 1136 01/23/17 1633 01/23/17 1818 01/23/17 2228 01/24/17 0304  AST 31 29  --   --   --   --   --   --   ALT 17 17  --   --   --   --   --   --   ALKPHOS 102  --   --   --   --   --   --   --   BILITOT 1.1  --   --   --   --   --   --   --   PROT 6.5  --   --   --   --   --   --   --   ALBUMIN 2.0* 1.8*  < > 1.7* 1.9* 1.8* 1.8* 1.7*  < > = values in this interval not displayed.  Recent Labs Lab 04-28-2017 1027  LIPASE 46    Recent Labs Lab 01/22/17 1034  AMMONIA 56*    CBC:  Recent Labs Lab 04-28-2017 1027 01/22/17 0535 01/23/17 0438 01/23/17 2229 01/24/17 0630  WBC 8.0 12.9* 12.8* 12.7* 11.7*  NEUTROABS 6.8*  --   --   --   --   HGB 13.1 12.5*  12.2* 11.5* 11.0*  HCT 39.2* 38.5* 36.3* 34.0* 33.0*  MCV 101.8* 103.4* 101.4* 101.2* 102.4*  PLT 122* 125* 94* 88* 80*    Cardiac Enzymes:  Recent Labs Lab 02/01/2017 1027 02/04/2017 1642 02/08/2017 1946 01/24/2017 2248 01/22/17 0239  TROPONINI 0.06* 0.03* 0.04* 0.04* 0.03*    BNP: Invalid input(s): POCBNP  CBG:  Recent Labs Lab 01/20/2017 1536  GLUCAP 98    Microbiology: Results for orders placed or performed during the hospital encounter of 01/26/2017  Blood Culture (routine x 2)     Status: None (Preliminary result)   Collection Time: 02/03/2017 10:27 AM  Result Value Ref Range Status   Specimen Description BLOOD LEFT ARM  Final   Special Requests BOTTLES DRAWN AEROBIC AND ANAEROBIC ANA2ML AER3ML  Final   Culture NO GROWTH 3 DAYS  Final   Report Status PENDING  Incomplete  C difficile quick scan w PCR reflex     Status: None   Collection Time: 01/27/2017 11:28 AM  Result Value Ref Range Status   C Diff antigen NEGATIVE NEGATIVE Final   C Diff toxin NEGATIVE NEGATIVE Final   C Diff interpretation No C. difficile detected.  Final  Gastrointestinal Panel by PCR , Stool     Status: None   Collection Time: 01/15/2017 11:28 AM  Result Value Ref Range Status   Campylobacter species NOT DETECTED NOT DETECTED  Final   Plesimonas shigelloides NOT DETECTED NOT DETECTED Final   Salmonella species NOT DETECTED NOT DETECTED Final   Yersinia enterocolitica NOT DETECTED NOT DETECTED Final   Vibrio species NOT DETECTED NOT DETECTED Final   Vibrio cholerae NOT DETECTED NOT DETECTED Final   Enteroaggregative E coli (EAEC) NOT DETECTED NOT DETECTED Final   Enteropathogenic E coli (EPEC) NOT DETECTED NOT DETECTED Final   Enterotoxigenic E coli (ETEC) NOT DETECTED NOT DETECTED Final   Shiga like toxin producing E coli (STEC) NOT DETECTED NOT DETECTED Final   Shigella/Enteroinvasive E coli (EIEC) NOT DETECTED NOT DETECTED Final   Cryptosporidium NOT DETECTED NOT DETECTED Final   Cyclospora cayetanensis NOT DETECTED NOT DETECTED Final   Entamoeba histolytica NOT DETECTED NOT DETECTED Final   Giardia lamblia NOT DETECTED NOT DETECTED Final   Adenovirus F40/41 NOT DETECTED NOT DETECTED Final   Astrovirus NOT DETECTED NOT DETECTED Final   Norovirus GI/GII NOT DETECTED NOT DETECTED Final   Rotavirus A NOT DETECTED NOT DETECTED Final   Sapovirus (I, II, IV, and V) NOT DETECTED NOT DETECTED Final  Blood Culture (routine x 2)     Status: None (Preliminary result)   Collection Time: 01/25/2017 11:28 AM  Result Value Ref Range Status   Specimen Description BLOOD RIGHT PICC  Final   Special Requests BOTTLES DRAWN AEROBIC AND ANAEROBIC ANA7ML AER9ML  Final   Culture NO GROWTH 3 DAYS  Final   Report Status PENDING  Incomplete  Urine culture     Status: None   Collection Time: 01/16/2017 12:10 PM  Result Value Ref Range Status   Specimen Description URINE, RANDOM  Final   Special Requests NONE  Final   Culture   Final    NO GROWTH Performed at [facility], [address], [place], [place] [address]    Report Status 01/22/2017 FINAL  Final  MRSA PCR Screening     Status: None   Collection Time: 01/28/2017  6:07 PM  Result Value Ref Range Status   MRSA by PCR NEGATIVE NEGATIVE Final    Comment:  The  GeneXpert MRSA Assay (FDA approved for NASAL specimens only), is one component of a comprehensive MRSA colonization surveillance program. It is not intended to diagnose MRSA infection nor to guide or monitor treatment for MRSA infections.     Coagulation Studies:  Recent Labs  01/22/2017 2110  LABPROT 16.6*  INR 1.33    Urinalysis:  Recent Labs  02/01/2017 1210  COLORURINE AMBER*  LABSPEC 1.025  PHURINE 5.0  GLUCOSEU 50*  HGBUR SMALL*  BILIRUBINUR NEGATIVE  KETONESUR 5*  PROTEINUR 30*  NITRITE NEGATIVE  LEUKOCYTESUR SMALL*      Imaging: US Renal  Result Date: 01/22/2017 CLINICAL DATA:  Acute renal failure EXAM: RENAL / URINARY TRACT ULTRASOUND COMPLETE COMPARISON:  CT 12/27/2016 FINDINGS: Right Kidney: Length: 13.1 cm. Mildly increased echotexture. No mass or hydronephrosis. Left Kidney: Length: 12.3 cm. Mildly increased echotexture. No mass or hydronephrosis. Bladder: Decompressed with Foley catheter in place. IMPRESSION: Mildly increased echotexture in the kidneys compatible with chronic medical renal disease. No hydronephrosis. Electronically Signed   By: Charlett Nose M.D.   On: 01/22/2017 11:30     Medications:   . norepinephrine (LEVOPHED) Adult infusion 18 mcg/min (01/24/17 1004)  . pureflow 3 each (01/24/17 0932)  . vasopressin (PITRESSIN) infusion - *FOR SHOCK* 0.03 Units/min (01/24/17 1005)   . cefTRIAXone  2 g Intravenous Q24H  . feeding supplement (ENSURE ENLIVE)  237 mL Oral TID BM  . fluconazole (DIFLUCAN) IV  400 mg Intravenous Q24H  . folic acid  1 mg Intravenous Daily  . hydrocerin   Topical BID  . hydrocortisone sod succinate (SOLU-CORTEF) inj  50 mg Intravenous Q8H  . ipratropium-albuterol  3 mL Nebulization Q6H  . ipratropium-albuterol      . mouth rinse  15 mL Mouth Rinse BID  . metronidazole  500 mg Intravenous Q8H  . midodrine  10 mg Oral BID WC  . sodium chloride flush  10-40 mL Intracatheter Q12H   sodium chloride, alteplase, alum &  mag hydroxide-simeth, heparin, ondansetron (ZOFRAN) IV, sodium chloride flush  Assessment/ Plan:  Mr. QUADARIUS HENTON is a 56 y.o. white male with decubitus ulcer, hypertension, congestive heart failure, atrial fibrillation, depression who was admitted to [facility] from 1/14 to 2/2 for sepsis. He was discharged with metronidazole and ceftriaxone for a total of six weeks. Patient was admitted to [facility] on 01/17/2017 with sepsis.   1. Acute renal failure:  anuric. On vasopressors. Baseline creatinine of 0.86 on 01/11/17 Initiated on CRRT on 2/9. CVVHD 4K bath. Therapy rate 2000, BFR 300, UF 75 - renally dose all medications - monitor renal function, electrolytes, urine output and volume status.   2. Sepsis: hypotension. On vasopressors. Secondary to sacral decubitus ulcer infection.  - empiric antibiotics: ceftriaxone and metronidazole. - Continue supportive care.   3. Anasarca/Edema - 3rd spacing from low albumin - iv albumin supplementation x 3 days  4. Hyponatremia: improved with CRRT.    LOS: 3 Sid Greener 2/12/201810:28 AM

## 2017-01-24 NOTE — Progress Notes (Signed)
[facility] CLINIC INFECTIOUS DISEASE PROGRESS NOTE Date of Admission:  February 15, 2017     ID: [name] is a 56 y.o. male with hypotension, decub ulcer, ARF Active Problems:   Sepsis (HCC)   Subjective: Started CRRT, remains hypotensive on pressors, hypothermic, less responsive   ROS  Unable to obtain  Medications:  Antibiotics Given (last 72 hours)    Date/Time Action Medication Dose Rate   02-15-2017 1655 Given   metroNIDAZOLE (FLAGYL) tablet 500 mg 500 mg    02-15-2017 2246 Given   metroNIDAZOLE (FLAGYL) tablet 500 mg 500 mg    01/22/17 0943 Given   metroNIDAZOLE (FLAGYL) IVPB 500 mg 500 mg 100 mL/hr   01/22/17 1735 Given   metroNIDAZOLE (FLAGYL) IVPB 500 mg 500 mg 100 mL/hr   01/22/17 2044 Given   cefTRIAXone (ROCEPHIN) IVPB 2 g 2 g 100 mL/hr   01/23/17 0243 Given   metroNIDAZOLE (FLAGYL) IVPB 500 mg 500 mg 100 mL/hr   01/23/17 0956 Given   metroNIDAZOLE (FLAGYL) IVPB 500 mg 500 mg 100 mL/hr   01/23/17 1746 Given   metroNIDAZOLE (FLAGYL) IVPB 500 mg 500 mg 100 mL/hr   01/23/17 2051 Given   cefTRIAXone (ROCEPHIN) IVPB 2 g 2 g 100 mL/hr   01/24/17 8119 Given   metroNIDAZOLE (FLAGYL) IVPB 500 mg 500 mg 100 mL/hr   01/24/17 1052 Given   metroNIDAZOLE (FLAGYL) IVPB 500 mg 500 mg 100 mL/hr     . albumin human  12.5 g Intravenous BID  . cefTRIAXone  2 g Intravenous Q24H  . feeding supplement (ENSURE ENLIVE)  237 mL Oral TID BM  . fluconazole (DIFLUCAN) IV  400 mg Intravenous Q24H  . folic acid  1 mg Intravenous Daily  . hydrocerin   Topical BID  . hydrocortisone sod succinate (SOLU-CORTEF) inj  50 mg Intravenous Q8H  . ipratropium-albuterol  3 mL Nebulization Q6H  . ipratropium-albuterol      . mouth rinse  15 mL Mouth Rinse BID  . metronidazole  500 mg Intravenous Q8H  . midodrine  10 mg Oral BID WC  . pantoprazole (PROTONIX) IV  40 mg Intravenous Q24H  . sodium chloride flush  10-40 mL Intracatheter Q12H    Objective: Vital signs in last 24 hours: Temp:  [95.9 F  (35.5 C)-97.4 F (36.3 C)] 97.4 F (36.3 C) (02/12 1200) Pulse Rate:  [56-92] 90 (02/12 1500) Resp:  [7-19] 13 (02/12 1500) BP: (63-118)/(43-87) 110/87 (02/12 1500) SpO2:  [89 %-100 %] 99 % (02/12 1500) Weight:  [132.6 kg (292 lb 5.3 oz)] 132.6 kg (292 lb 5.3 oz) (02/12 0500) Constitutional: minimally responsive, ill appearlying .morbidly obese HENT: anicteric Mouth/Throat: Oropharynx is clear and dry . No oropharyngeal exudate.  Cardiovascular: Normal rate, regular rhythm and normal heart sounds.  Pulmonary/Chest: Effort normal and breath sounds normal. No respiratory distress. He has no wheezes.  Abdominal: Soft.distended  obese Bowel sounds are normal. . There is no tenderness.  Lymphadenopathy: He has no cervical adenopathy.  Neurological: lethargic  Skin: woudn unable to be examined Psychiatric: lethargic Loine R neck IJ - on CRRT  Lab Results  Recent Labs  01/23/17 2229  01/24/17 0630 01/24/17 1116 01/24/17 1505  WBC 12.7*  --  11.7*  --   --   HGB 11.5*  --  11.0*  --   --   HCT 34.0*  --  33.0*  --   --   NA  --   < > 136 138 136  K  --   < >  3.7 3.8 4.3  CL  --   < > 105 106 106  CO2  --   < > 25 26 26   BUN  --   < > 24* 25* 23*  CREATININE  --   < > 2.42* 2.51* 2.21*  < > = values in this interval not displayed.  Microbiology: Results for orders placed or performed during the hospital encounter of 01/28/17  Blood Culture (routine x 2)     Status: None (Preliminary result)   Collection Time: 01-28-2017 10:27 AM  Result Value Ref Range Status   Specimen Description BLOOD LEFT ARM  Final   Special Requests BOTTLES DRAWN AEROBIC AND ANAEROBIC ANA2ML AER3ML  Final   Culture NO GROWTH 3 DAYS  Final   Report Status PENDING  Incomplete  C difficile quick scan w PCR reflex     Status: None   Collection Time: Jan 28, 2017 11:28 AM  Result Value Ref Range Status   C Diff antigen NEGATIVE NEGATIVE Final   C Diff toxin NEGATIVE NEGATIVE Final   C Diff interpretation No C.  difficile detected.  Final  Gastrointestinal Panel by PCR , Stool     Status: None   Collection Time: 01/28/17 11:28 AM  Result Value Ref Range Status   Campylobacter species NOT DETECTED NOT DETECTED Final   Plesimonas shigelloides NOT DETECTED NOT DETECTED Final   Salmonella species NOT DETECTED NOT DETECTED Final   Yersinia enterocolitica NOT DETECTED NOT DETECTED Final   Vibrio species NOT DETECTED NOT DETECTED Final   Vibrio cholerae NOT DETECTED NOT DETECTED Final   Enteroaggregative E coli (EAEC) NOT DETECTED NOT DETECTED Final   Enteropathogenic E coli (EPEC) NOT DETECTED NOT DETECTED Final   Enterotoxigenic E coli (ETEC) NOT DETECTED NOT DETECTED Final   Shiga like toxin producing E coli (STEC) NOT DETECTED NOT DETECTED Final   Shigella/Enteroinvasive E coli (EIEC) NOT DETECTED NOT DETECTED Final   Cryptosporidium NOT DETECTED NOT DETECTED Final   Cyclospora cayetanensis NOT DETECTED NOT DETECTED Final   Entamoeba histolytica NOT DETECTED NOT DETECTED Final   Giardia lamblia NOT DETECTED NOT DETECTED Final   Adenovirus F40/41 NOT DETECTED NOT DETECTED Final   Astrovirus NOT DETECTED NOT DETECTED Final   Norovirus GI/GII NOT DETECTED NOT DETECTED Final   Rotavirus A NOT DETECTED NOT DETECTED Final   Sapovirus (I, II, IV, and V) NOT DETECTED NOT DETECTED Final  Blood Culture (routine x 2)     Status: None (Preliminary result)   Collection Time: 2017-01-28 11:28 AM  Result Value Ref Range Status   Specimen Description BLOOD RIGHT PICC  Final   Special Requests BOTTLES DRAWN AEROBIC AND ANAEROBIC ANA7ML AER9ML  Final   Culture NO GROWTH 3 DAYS  Final   Report Status PENDING  Incomplete  Urine culture     Status: None   Collection Time: Jan 28, 2017 12:10 PM  Result Value Ref Range Status   Specimen Description URINE, RANDOM  Final   Special Requests NONE  Final   Culture   Final    NO GROWTH Performed at [facility], [address] [place], [place] [address]    Report  Status 01/22/2017 FINAL  Final  MRSA PCR Screening     Status: None   Collection Time: 01-28-2017  6:07 PM  Result Value Ref Range Status   MRSA by PCR NEGATIVE NEGATIVE Final    Comment:        The GeneXpert MRSA Assay (FDA approved for NASAL specimens  only), is one component of a comprehensive MRSA colonization surveillance program. It is not intended to diagnose MRSA infection nor to guide or monitor treatment for MRSA infections.     Studies/Results: No results found.  Assessment/Plan: [name] RouteMichael J Toral is a 56 y.o. male readmitted with sepsis and ARF following DC a week ago on IV ctx and oral flagyl for a severe sacral decub. On admission the wound appears clean and I think would be unlikley source of his presentation. Main issue seems to be his ARF which may be related to his recent NV  So far BCX UCx, MRSA PCR and C diff, Stool PCR neg. However remains critically ill and worsening.   Rec Cont ctx and flagyl.   Can stop vanco since cx neg and MRSA PCR neg  Poor prognosis and would suggest palliative care consult   Thank you very much for the consult. Will follow with you.  Rolene Andrades P   01/24/2017, 4:47 PM

## 2017-01-24 NOTE — Progress Notes (Signed)
Pt is in stable condition at this time. Pt has required increase in vasopressors. Pt girlfriend called and requested family meeting tomorrow at 1130 AM. Dr.Simmonds is aware. Full assessment in EPIC. Report given to [place] City[place]le, Charity fundraiserN.

## 2017-01-24 NOTE — Progress Notes (Signed)
Pt had 13 beat run of VTACH. Dr.Simmonds notified. Will continue to assess.

## 2017-01-25 ENCOUNTER — Inpatient Hospital Stay: Payer: BC Managed Care – PPO

## 2017-01-25 LAB — RENAL FUNCTION PANEL
ALBUMIN: 2.6 g/dL — AB (ref 3.5–5.0)
ALBUMIN: 2 g/dL — AB (ref 3.5–5.0)
ANION GAP: 5 (ref 5–15)
ANION GAP: 6 (ref 5–15)
ANION GAP: 4 — AB (ref 5–15)
Albumin: 2.1 g/dL — ABNORMAL LOW (ref 3.5–5.0)
Albumin: 2.2 g/dL — ABNORMAL LOW (ref 3.5–5.0)
Anion gap: 5 (ref 5–15)
BUN: 22 mg/dL — ABNORMAL HIGH (ref 6–20)
BUN: 23 mg/dL — ABNORMAL HIGH (ref 6–20)
BUN: 23 mg/dL — AB (ref 6–20)
BUN: 21 mg/dL — ABNORMAL HIGH (ref 6–20)
CALCIUM: 8.4 mg/dL — AB (ref 8.9–10.3)
CHLORIDE: 105 mmol/L (ref 101–111)
CHLORIDE: 106 mmol/L (ref 101–111)
CO2: 24 mmol/L (ref 22–32)
CO2: 24 mmol/L (ref 22–32)
CO2: 24 mmol/L (ref 22–32)
CO2: 25 mmol/L (ref 22–32)
CREATININE: 1.94 mg/dL — AB (ref 0.61–1.24)
CREATININE: 2.12 mg/dL — AB (ref 0.61–1.24)
CREATININE: 2.04 mg/dL — AB (ref 0.61–1.24)
Calcium: 8.3 mg/dL — ABNORMAL LOW (ref 8.9–10.3)
Calcium: 8.4 mg/dL — ABNORMAL LOW (ref 8.9–10.3)
Calcium: 8.4 mg/dL — ABNORMAL LOW (ref 8.9–10.3)
Chloride: 104 mmol/L (ref 101–111)
Chloride: 105 mmol/L (ref 101–111)
Creatinine, Ser: 1.86 mg/dL — ABNORMAL HIGH (ref 0.61–1.24)
GFR calc Af Amer: 43 mL/min — ABNORMAL LOW (ref >60)
GFR calc Af Amer: 40 mL/min — ABNORMAL LOW (ref >60)
GFR calc non Af Amer: 33 mL/min — ABNORMAL LOW (ref >60)
GFR calc non Af Amer: 39 mL/min — ABNORMAL LOW (ref >60)
GFR, EST AFRICAN AMERICAN: 38 mL/min — AB (ref >60)
GFR, EST AFRICAN AMERICAN: 45 mL/min — AB (ref >60)
GFR, EST NON AFRICAN AMERICAN: 37 mL/min — AB (ref >60)
GFR, EST NON AFRICAN AMERICAN: 35 mL/min — AB (ref >60)
GLUCOSE: 264 mg/dL — AB (ref 65–99)
GLUCOSE: 179 mg/dL — AB (ref 65–99)
Glucose, Bld: 244 mg/dL — ABNORMAL HIGH (ref 65–99)
Glucose, Bld: 219 mg/dL — ABNORMAL HIGH (ref 65–99)
PHOSPHORUS: 1.4 mg/dL — AB (ref 2.5–4.6)
PHOSPHORUS: 2 mg/dL — AB (ref 2.5–4.6)
POTASSIUM: 3.7 mmol/L (ref 3.5–5.1)
Phosphorus: 1.7 mg/dL — ABNORMAL LOW (ref 2.5–4.6)
Phosphorus: 3.3 mg/dL (ref 2.5–4.6)
Potassium: 3.8 mmol/L (ref 3.5–5.1)
Potassium: 4 mmol/L (ref 3.5–5.1)
Potassium: 4.5 mmol/L (ref 3.5–5.1)
SODIUM: 134 mmol/L — AB (ref 135–145)
Sodium: 134 mmol/L — ABNORMAL LOW (ref 135–145)
Sodium: 134 mmol/L — ABNORMAL LOW (ref 135–145)
Sodium: 135 mmol/L (ref 135–145)

## 2017-01-25 LAB — CBC
HEMATOCRIT: 29.6 % — AB (ref 40.0–52.0)
Hemoglobin: 9.8 g/dL — ABNORMAL LOW (ref 13.0–18.0)
MCH: 34 pg (ref 26.0–34.0)
MCHC: 33.2 g/dL (ref 32.0–36.0)
MCV: 102.4 fL — ABNORMAL HIGH (ref 80.0–100.0)
Platelets: 77 10*3/uL — ABNORMAL LOW (ref 150–440)
RBC: 2.89 MIL/uL — ABNORMAL LOW (ref 4.40–5.90)
RDW: 17 % — AB (ref 11.5–14.5)
WBC: 11.1 10*3/uL — AB (ref 3.8–10.6)

## 2017-01-25 LAB — COMPREHENSIVE METABOLIC PANEL
ALT: 19 U/L (ref 17–63)
ANION GAP: 4 — AB (ref 5–15)
AST: 39 U/L (ref 15–41)
Albumin: 2.1 g/dL — ABNORMAL LOW (ref 3.5–5.0)
Alkaline Phosphatase: 79 U/L (ref 38–126)
BILIRUBIN TOTAL: 0.8 mg/dL (ref 0.3–1.2)
BUN: 23 mg/dL — AB (ref 6–20)
CHLORIDE: 106 mmol/L (ref 101–111)
CO2: 26 mmol/L (ref 22–32)
Calcium: 8.8 mg/dL — ABNORMAL LOW (ref 8.9–10.3)
Creatinine, Ser: 2.15 mg/dL — ABNORMAL HIGH (ref 0.61–1.24)
GFR calc Af Amer: 38 mL/min — ABNORMAL LOW (ref >60)
GFR calc non Af Amer: 33 mL/min — ABNORMAL LOW (ref >60)
Glucose, Bld: 215 mg/dL — ABNORMAL HIGH (ref 65–99)
POTASSIUM: 4.4 mmol/L (ref 3.5–5.1)
SODIUM: 136 mmol/L (ref 135–145)
TOTAL PROTEIN: 5.7 g/dL — AB (ref 6.5–8.1)

## 2017-01-25 LAB — PHOSPHORUS: PHOSPHORUS: 2.3 mg/dL — AB (ref 2.5–4.6)

## 2017-01-25 LAB — MAGNESIUM
MAGNESIUM: 1.7 mg/dL (ref 1.7–2.4)
MAGNESIUM: 1.7 mg/dL (ref 1.7–2.4)
MAGNESIUM: 1.8 mg/dL (ref 1.7–2.4)
Magnesium: 1.8 mg/dL (ref 1.7–2.4)
Magnesium: 1.7 mg/dL (ref 1.7–2.4)

## 2017-01-25 LAB — GLUCOSE, CAPILLARY
GLUCOSE-CAPILLARY: 170 mg/dL — AB (ref 65–99)
Glucose-Capillary: 151 mg/dL — ABNORMAL HIGH (ref 65–99)
Glucose-Capillary: >600 mg/dL (ref 65–99)

## 2017-01-25 LAB — HIV ANTIBODY (ROUTINE TESTING W REFLEX): HIV SCREEN 4TH GENERATION: NONREACTIVE

## 2017-01-25 LAB — GLUCOSE, RANDOM: GLUCOSE: 262 mg/dL — AB (ref 65–99)

## 2017-01-25 LAB — APTT: APTT: 43 s — AB (ref 24–36)

## 2017-01-25 MED ORDER — MIDODRINE HCL 5 MG PO TABS
10.0000 mg | ORAL_TABLET | Freq: Three times a day (TID) | ORAL | Status: DC
  Administered 2017-01-25 – 2017-02-08 (×39): 10 mg via ORAL
  Filled 2017-01-25 (×11): qty 2
  Filled 2017-01-25: qty 1
  Filled 2017-01-25 (×29): qty 2

## 2017-01-25 MED ORDER — ENSURE ENLIVE PO LIQD
237.0000 mL | Freq: Three times a day (TID) | ORAL | Status: DC
  Administered 2017-01-25 – 2017-01-27 (×5): 237 mL via ORAL

## 2017-01-25 MED ORDER — PRO-STAT SUGAR FREE PO LIQD
30.0000 mL | Freq: Two times a day (BID) | ORAL | Status: DC
  Administered 2017-01-25 – 2017-01-28 (×5): 30 mL via ORAL

## 2017-01-25 MED ORDER — PANTOPRAZOLE SODIUM 40 MG PO TBEC
40.0000 mg | DELAYED_RELEASE_TABLET | Freq: Every day | ORAL | Status: DC
  Administered 2017-01-25: 40 mg via ORAL
  Filled 2017-01-25: qty 1

## 2017-01-25 MED ORDER — FOLIC ACID 1 MG PO TABS
1.0000 mg | ORAL_TABLET | Freq: Every day | ORAL | Status: DC
  Administered 2017-01-26 – 2017-02-08 (×12): 1 mg via ORAL
  Filled 2017-01-25 (×13): qty 1

## 2017-01-25 MED ORDER — INSULIN ASPART 100 UNIT/ML ~~LOC~~ SOLN
0.0000 [IU] | Freq: Three times a day (TID) | SUBCUTANEOUS | Status: DC
  Administered 2017-01-25: 3 [IU] via SUBCUTANEOUS
  Administered 2017-01-25: 8 [IU] via SUBCUTANEOUS
  Administered 2017-01-26 (×2): 3 [IU] via SUBCUTANEOUS
  Administered 2017-01-26 – 2017-01-29 (×6): 2 [IU] via SUBCUTANEOUS
  Administered 2017-01-30: 3 [IU] via SUBCUTANEOUS
  Filled 2017-01-25 (×2): qty 2
  Filled 2017-01-25: qty 3
  Filled 2017-01-25: qty 2
  Filled 2017-01-25: qty 3
  Filled 2017-01-25: qty 8
  Filled 2017-01-25: qty 2
  Filled 2017-01-25 (×2): qty 3
  Filled 2017-01-25 (×2): qty 2

## 2017-01-25 MED ORDER — HYDROCORTISONE NA SUCCINATE PF 100 MG IJ SOLR
50.0000 mg | Freq: Two times a day (BID) | INTRAMUSCULAR | Status: DC
  Administered 2017-01-25 – 2017-01-27 (×4): 50 mg via INTRAVENOUS
  Filled 2017-01-25 (×4): qty 2

## 2017-01-25 MED ORDER — DEXTROSE 5 % IV SOLN
30.0000 mmol | Freq: Once | INTRAVENOUS | Status: AC
  Administered 2017-01-25: 30 mmol via INTRAVENOUS
  Filled 2017-01-25: qty 10

## 2017-01-25 MED ORDER — INSULIN GLARGINE 100 UNIT/ML ~~LOC~~ SOLN
10.0000 [IU] | Freq: Every day | SUBCUTANEOUS | Status: DC
  Administered 2017-01-25 – 2017-01-31 (×7): 10 [IU] via SUBCUTANEOUS
  Filled 2017-01-25 (×8): qty 0.1

## 2017-01-25 MED ORDER — INSULIN ASPART 100 UNIT/ML ~~LOC~~ SOLN
0.0000 [IU] | Freq: Every day | SUBCUTANEOUS | Status: DC
  Administered 2017-01-30: 2 [IU] via SUBCUTANEOUS
  Filled 2017-01-25: qty 2

## 2017-01-25 NOTE — Progress Notes (Signed)
PHARMACIST - PHYSICIAN COMMUNICATION  DR:   Sung AmabileSimonds  CONCERNING: IV to Oral Route Change Policy  RECOMMENDATION: This patient is receiving folic acid by the intravenous route.  Based on criteria approved by the Pharmacy and Therapeutics Committee, the intravenous medication(s) is/are being converted to the equivalent oral dose form(s).   DESCRIPTION: These criteria include:  The patient is eating (either orally or via tube) and/or has been taking other orally administered medications for a least 24 hours  The patient has no evidence of active gastrointestinal bleeding or impaired GI absorption (gastrectomy, short bowel, patient on TNA or NPO).  If you have questions about this conversion, please contact the Pharmacy Department  []   [phone]( [phone] )  [facility] [x]   [phone] )  [facility] []   [phone]( [phone] )  [facility] []   [phone]( [phone] )  [facility] []   [phone]( [phone] )  [facility]   Valentina GuChristy, Ocean Schildt D, [facility] Center[facility]PH 01/25/2017 11:35 AM

## 2017-01-25 NOTE — Progress Notes (Signed)
Initial Nutrition Assessment  DOCUMENTATION CODES:   Severe malnutrition in context of acute illness/injury, Obesity unspecified  INTERVENTION:  -Recommend increasing EnsureEnlive to QID -Recommend Prostat BID daily -Corporate treasurer Cup at [employer] [place] liberalizing diet to optimize nutritional intake as pt with increased needs related to acute and chronic illness (CRRT, chronic wound, sepsis); MD Ashby Dawes agreeable to liberalizing diet to Regular with Fluid Restriction.  -Pt would benefit from addition of renal MVI   NUTRITION DIAGNOSIS:   Malnutrition related to acute illness as evidenced by energy intake < or equal to 50% for > or equal to 5 days, severe fluid accumulation, mild depletion of body fat, percent weight loss, mild depletion of muscle mass.  GOAL:   Patient will meet greater than or equal to 90% of their needs  MONITOR:   PO intake, Supplement acceptance, Labs, Weight trends  REASON FOR ASSESSMENT:   Rounds    ASSESSMENT:   56 yo male admitted with septic shock secondary to infected decubitus ulcer, acute respiratory failure due to pulmonary congestion, ARF with oligruia21 yo male admitted with septic shock secondary to infected decubitus ulcer, acute respiratory failure due to pulmonary congestion, ARF with oligruia  Pt currently on CRRT, UF 1.7 L in previous 24 hours, levophed at 15 mcg/min, off vasopressin  Pt alert on visit today, girlfriend at bedside. Pt very weak.  Pt reports last meal was a bowl of chicken soup on Thursday 2/8 in which pt only drank the broth. Pt reports he was in a "coma" until the middle of the night last night when he woke up and rank 2 Ensure. Appetite improved at present. Pt ate some eggs, a blueberry muffin, bites of grits, 1/2 milk and juice at breakfast this AM, has not received lunch tray.   Pt with recent prolonged hospital stay in last month in which pt with inadequate oral intake with calorie count and  consideration of nutrition support and pt met clinical characteristics for moderate malnutrition in context of acute illness at that time.   Per weight encounters, pt with 10.8% wt loss in 1 month (324 pounds to 289 pounds). Noted wt trend may be off due to edema.   Nutrition-Focused physical exam completed. Findings are mild fat depletion, mild muscle depletion, and severe edema. Difficult to assess fat and muscle wasting in UE/LE due to edema   Labs: phosphorus 1.7 (L), potassium 3.7 (wdl), sodium 134, magnesium wdl, albumin 2.1, corrected calcium 9.83, CBG 151 Meds: reviewed  Diet Order: Renal Diet Order with Fluid restriction  Skin:  Wound (see comment) (unstageable, full thickeness tissue loss on buttock)  Last BM:  01/25/17  Height:   Ht Readings from Last 1 Encounters:  02/03/2017 6' 4" (1.93 m)    Weight:   Wt Readings from Last 1 Encounters:  01/25/17 289 lb 14.5 oz (131.5 kg)   Filed Weights   01/23/17 0400 01/24/17 0500 01/25/17 0400  Weight: 298 lb 11.6 oz (135.5 kg) 292 lb 5.3 oz (132.6 kg) 289 lb 14.5 oz (131.5 kg)    Ideal Body Weight:  91.8 kg  BMI:  Body mass index is 35.29 kg/m.  Estimated Nutritional Needs:   Kcal:  2500-2800 kcals  Protein:  180-230 g  Fluid:  1000 mL plus UOP or per MD  EDUCATION NEEDS:   No education needs identified at this time  Kerman Passey [place], [place], [place] [phone] Pager  [phone] Weekend/On-Call Pager

## 2017-01-25 NOTE — Progress Notes (Signed)
Inpatient Diabetes Program Recommendations  AACE/ADA: New Consensus Statement on Inpatient Glycemic Control (2015)  Target Ranges:  Prepandial:   less than 140 mg/dL      Peak postprandial:   less than 180 mg/dL (1-2 hours)      Critically ill patients:  140 - 180 mg/dL   Results for [name] RouteDAMERON, Boruch J (MRN [id number]) as of 01/25/2017 08:56  Ref. Range 01/24/2017 11:16 01/24/2017 15:05 01/24/2017 18:48 01/24/2017 23:02 01/25/2017 04:58  Glucose Latest Ref Range: 65 - 99 mg/dL 657147 (H) 846163 (H) 962178 (H) 180 (H) 215 (H)   Results for [name] RouteDAMERON, Amor J (MRN [id number]) as of 01/25/2017 08:56  Ref. Range 12/26/2016 23:13  Hemoglobin A1C Latest Ref Range: 4.8 - 5.6 % 4.6 (L)   Review of Glycemic Control  Diabetes history: No Outpatient Diabetes medications: NA Current orders for Inpatient glycemic control: None  Inpatient Diabetes Program Recommendations: Correction (SSI): If steroids are continued, please consider ordeirng CBGs with Novolog 0-9 units ACHS.  NOTE: Ordered Solucortef 50 mg Q8H which is likely cause of hyperglycemia.  Thanks, Orlando PennerMarie Sander Speckman, RN, MSN, CDE Diabetes Coordinator Inpatient Diabetes Program [phone] (Team Pager from 8am to 5pm)

## 2017-01-25 NOTE — Progress Notes (Signed)
Pharmacy Antibiotic Note/ CRRT Medication Dosing  [name] is a 56 y.o. male admitted on 11-Feb-2017 with sepsis secondary to sacral decubitus ulcer. Patient discharged to [facility] on 2/2 on ceftriaxone and metronidazole. These have been continued in house. Pharmacy has been consulted for fluconazole dosing as well as medication adjustment for CRRT.  Plan: 1. Will continue Fluconazole 400 mg IV Q24hr.   2. No medications require renal adjustment at present. Will continue to follow.   Height: 6\' 4"  (193 cm) Weight: 289 lb 14.5 oz (131.5 kg) IBW/kg (Calculated) : 86.8  Temp (24hrs), Avg:97.7 F (36.5 C), Min:97.4 F (36.3 C), Max:98 F (36.7 C)   Recent Labs Lab February 11, 2017 1027 11-Feb-2017 1521  01/22/17 0535  01/23/17 0438  01/23/17 2229  01/24/17 0630 01/24/17 1116 01/24/17 1505 01/24/17 1848 01/24/17 2302 01/25/17 0458  WBC 8.0  --   --  12.9*  --  12.8*  --  12.7*  --  11.7*  --   --   --   --  11.1*  CREATININE 6.14*  --   < > 5.03*  < > 3.22*  < >  --   < > 2.42* 2.51* 2.21* 2.24* 2.12* 2.15*  LATICACIDVEN 2.2* 2.4*  --   --   --   --   --   --   --   --   --   --   --   --   --   < > = values in this interval not displayed.  Estimated Creatinine Clearance: 56.8 mL/min (by C-G formula based on SCr of 2.15 mg/dL (H)).    Allergies  Allergen Reactions  . Clindamycin/Lincomycin Diarrhea    Anti-infectives    Start     Dose/Rate Route Frequency Ordered Stop   01/22/17 1845  fluconazole (DIFLUCAN) IVPB 400 mg     400 mg 100 mL/hr over 120 Minutes Intravenous Every 24 hours 11-Feb-2017 1706     01/22/17 1800  ceFEPIme (MAXIPIME) 1 GM / 50mL IVPB premix  Status:  Discontinued     1 g 100 mL/hr over 30 Minutes Intravenous Every 24 hours February 11, 2017 1502 11-Feb-2017 1513   01/22/17 1000  metroNIDAZOLE (FLAGYL) IVPB 500 mg     500 mg 100 mL/hr over 60 Minutes Intravenous Every 8 hours 01/22/17 0926     02-11-2017 1800  cefTRIAXone (ROCEPHIN) 2 g in dextrose 5 % 50 mL  IVPB  Status:  Discontinued     2 g 100 mL/hr over 30 Minutes Intravenous Every 24 hours 11-Feb-2017 1519 February 11, 2017 1537   02-11-17 1800  cefTRIAXone (ROCEPHIN) IVPB 2 g     2 g 100 mL/hr over 30 Minutes Intravenous Every 24 hours 2017-02-11 1538     2017-02-11 1800  fluconazole (DIFLUCAN) IVPB 800 mg     800 mg 200 mL/hr over 120 Minutes Intravenous  Once 02-11-17 1706 02-11-2017 2335   11-Feb-2017 1600  metroNIDAZOLE (FLAGYL) tablet 500 mg  Status:  Discontinued     500 mg Oral 3 times daily 02/11/2017 1515 01/22/17 0925   2017-02-11 1515  cefTRIAXone (ROCEPHIN) 2 g in dextrose 5 % 50 mL IVPB  Status:  Discontinued     2 g 100 mL/hr over 30 Minutes Intravenous Every 24 hours February 11, 2017 1513 2017/02/11 1519   2017-02-11 1500  ceFEPIme (MAXIPIME) 1 GM / 50mL IVPB premix  Status:  Discontinued     1 g 100 mL/hr over 30 Minutes Intravenous Every 24 hours 2017/02/11 1410 Feb 11, 2017 1502  01/19/2017 1115  piperacillin-tazobactam (ZOSYN) IVPB 3.375 g     3.375 g 100 mL/hr over 30 Minutes Intravenous  Once 02/08/2017 1113 01/23/2017 1256   02/07/2017 1115  vancomycin (VANCOCIN) IVPB 1000 mg/200 mL premix     1,000 mg 200 mL/hr over 60 Minutes Intravenous  Once 02/05/2017 1113 02/06/2017 1333     Recent Results (from the past 240 hour(s))  Blood Culture (routine x 2)     Status: None (Preliminary result)   Collection Time: 02/04/2017 10:27 AM  Result Value Ref Range Status   Specimen Description BLOOD LEFT ARM  Final   Special Requests BOTTLES DRAWN AEROBIC AND ANAEROBIC ANA2ML AER3ML  Final   Culture NO GROWTH 4 DAYS  Final   Report Status PENDING  Incomplete  C difficile quick scan w PCR reflex     Status: None   Collection Time: 01/16/2017 11:28 AM  Result Value Ref Range Status   C Diff antigen NEGATIVE NEGATIVE Final   C Diff toxin NEGATIVE NEGATIVE Final   C Diff interpretation No C. difficile detected.  Final  Gastrointestinal Panel by PCR , Stool     Status: None   Collection Time: 01/18/2017 11:28 AM  Result Value  Ref Range Status   Campylobacter species NOT DETECTED NOT DETECTED Final   Plesimonas shigelloides NOT DETECTED NOT DETECTED Final   Salmonella species NOT DETECTED NOT DETECTED Final   Yersinia enterocolitica NOT DETECTED NOT DETECTED Final   Vibrio species NOT DETECTED NOT DETECTED Final   Vibrio cholerae NOT DETECTED NOT DETECTED Final   Enteroaggregative E coli (EAEC) NOT DETECTED NOT DETECTED Final   Enteropathogenic E coli (EPEC) NOT DETECTED NOT DETECTED Final   Enterotoxigenic E coli (ETEC) NOT DETECTED NOT DETECTED Final   Shiga like toxin producing E coli (STEC) NOT DETECTED NOT DETECTED Final   Shigella/Enteroinvasive E coli (EIEC) NOT DETECTED NOT DETECTED Final   Cryptosporidium NOT DETECTED NOT DETECTED Final   Cyclospora cayetanensis NOT DETECTED NOT DETECTED Final   Entamoeba histolytica NOT DETECTED NOT DETECTED Final   Giardia lamblia NOT DETECTED NOT DETECTED Final   Adenovirus F40/41 NOT DETECTED NOT DETECTED Final   Astrovirus NOT DETECTED NOT DETECTED Final   Norovirus GI/GII NOT DETECTED NOT DETECTED Final   Rotavirus A NOT DETECTED NOT DETECTED Final   Sapovirus (I, II, IV, and V) NOT DETECTED NOT DETECTED Final  Blood Culture (routine x 2)     Status: None (Preliminary result)   Collection Time: 01/13/2017 11:28 AM  Result Value Ref Range Status   Specimen Description BLOOD RIGHT PICC  Final   Special Requests BOTTLES DRAWN AEROBIC AND ANAEROBIC ANA7ML AER9ML  Final   Culture NO GROWTH 4 DAYS  Final   Report Status PENDING  Incomplete  Urine culture     Status: None   Collection Time: 01/25/2017 12:10 PM  Result Value Ref Range Status   Specimen Description URINE, RANDOM  Final   Special Requests NONE  Final   Culture   Final    NO GROWTH Performed at [facility], [address] [place], [place] [address]    Report Status 01/22/2017 FINAL  Final  MRSA PCR Screening     Status: None   Collection Time: 01/22/2017  6:07 PM  Result Value Ref Range Status    MRSA by PCR NEGATIVE NEGATIVE Final    Comment:        The GeneXpert MRSA Assay (FDA approved for NASAL specimens only), is one component  of a comprehensive MRSA colonization surveillance program. It is not intended to diagnose MRSA infection nor to guide or monitor treatment for MRSA infections.      Pharmacy will continue to monitor and adjust per consult.   Luisa Hart D 01/25/2017 11:23 AM

## 2017-01-25 NOTE — Progress Notes (Signed)
eLink Physician-Brief Progress Note Patient Name: [name] RouteMichael J Maalouf DOB: 01-Apr-1961 MRN: [id number]   Date of Service  01/25/2017  HPI/Events of Note  Repeat blood glucose = 264.  eICU Interventions  Continue blood glucose coverage per current Novolog SSI.     Intervention Category Intermediate Interventions: Hyperglycemia - evaluation and treatment  Sommer,Steven Dennard Nipugene 01/25/2017, 6:57 PM

## 2017-01-25 NOTE — Progress Notes (Signed)
eLink Physician-Brief Progress Note Patient Name: [name] RouteMichael J Spagnuolo DOB: 11/06/1961 MRN: [id number]   Date of Service  01/25/2017  HPI/Events of Note  Blood glucose = 151 >> >600.   eICU Interventions  Will send glucose to lab STAT to verify.     Intervention Category Major Interventions: Hyperglycemia - active titration of insulin therapy  Sommer,Steven Dennard Nipugene 01/25/2017, 6:05 PM

## 2017-01-25 NOTE — Progress Notes (Signed)
[facility] CLINIC INFECTIOUS DISEASE PROGRESS NOTE Date of Admission:  01/13/2017     ID: [name] is a 56 y.o. male with hypotension, decub ulcer, ARF Active Problems:   Sepsis (HCC)   Subjective: Much more alert today, decreasing vasopressor need, no fevers ROS  Unable to obtain  Medications:  Antibiotics Given (last 72 hours)    Date/Time Action Medication Dose Rate   01/22/17 1735 Given   metroNIDAZOLE (FLAGYL) IVPB 500 mg 500 mg 100 mL/hr   01/22/17 2044 Given   cefTRIAXone (ROCEPHIN) IVPB 2 g 2 g 100 mL/hr   01/23/17 0243 Given   metroNIDAZOLE (FLAGYL) IVPB 500 mg 500 mg 100 mL/hr   01/23/17 0956 Given   metroNIDAZOLE (FLAGYL) IVPB 500 mg 500 mg 100 mL/hr   01/23/17 1746 Given   metroNIDAZOLE (FLAGYL) IVPB 500 mg 500 mg 100 mL/hr   01/23/17 2051 Given   cefTRIAXone (ROCEPHIN) IVPB 2 g 2 g 100 mL/hr   01/24/17 1610 Given   metroNIDAZOLE (FLAGYL) IVPB 500 mg 500 mg 100 mL/hr   01/24/17 1052 Given   metroNIDAZOLE (FLAGYL) IVPB 500 mg 500 mg 100 mL/hr   01/24/17 1739 Given   metroNIDAZOLE (FLAGYL) IVPB 500 mg 500 mg 100 mL/hr   01/24/17 2041 Given   cefTRIAXone (ROCEPHIN) IVPB 2 g 2 g 100 mL/hr   01/25/17 9604 Given   metroNIDAZOLE (FLAGYL) IVPB 500 mg 500 mg 100 mL/hr   01/25/17 1047 Given   metroNIDAZOLE (FLAGYL) IVPB 500 mg 500 mg 100 mL/hr     . albumin human  12.5 g Intravenous BID  . cefTRIAXone  2 g Intravenous Q24H  . feeding supplement (ENSURE ENLIVE)  237 mL Oral TID WC & HS  . feeding supplement (PRO-STAT SUGAR FREE 64)  30 mL Oral BID  . fluconazole (DIFLUCAN) IV  400 mg Intravenous Q24H  . [START ON 01/26/2017] folic acid  1 mg Oral Daily  . hydrocerin   Topical BID  . hydrocortisone sod succinate (SOLU-CORTEF) inj  50 mg Intravenous Q12H  . insulin aspart  0-15 Units Subcutaneous TID WC  . insulin aspart  0-5 Units Subcutaneous QHS  . insulin glargine  10 Units Subcutaneous QHS  . ipratropium-albuterol  3 mL Nebulization Q6H  . mouth rinse  15  mL Mouth Rinse BID  . metronidazole  500 mg Intravenous Q8H  . midodrine  10 mg Oral TID WC  . pantoprazole  40 mg Oral Q1200  . potassium phosphate IVPB (mmol)  30 mmol Intravenous Once  . sodium chloride flush  10-40 mL Intracatheter Q12H    Objective: Vital signs in last 24 hours: Temp:  [97.6 F (36.4 C)-98 F (36.7 C)] 97.6 F (36.4 C) (02/13 0700) Pulse Rate:  [63-93] 63 (02/13 0700) Resp:  [10-13] 11 (02/13 0700) BP: (95-121)/(63-91) 104/75 (02/13 0700) SpO2:  [92 %-100 %] 95 % (02/13 1415) Weight:  [131.5 kg (289 lb 14.5 oz)] 131.5 kg (289 lb 14.5 oz) (02/13 0400) Constitutional: sitting up in bed, more alert.morbidly obese HENT: anicteric Mouth/Throat: Oropharynx is clear and dry . No oropharyngeal exudate.  Cardiovascular: Normal rate, regular rhythm and normal heart sounds.  Pulmonary/Chest: Effort normal and breath sounds normal. No respiratory distress. He has no wheezes.  Abdominal: Soft.distended  obese Bowel sounds are normal. . There is no tenderness.  Lymphadenopathy: He has no cervical adenopathy.  Neurological: more alert and interactive Skin: wound covered Psychiatric: interactive Loine R neck IJ - on CRRT  Lab Results  Recent Labs  01/24/17 0630  01/25/17 0458 01/25/17 1119 01/25/17 1415  WBC 11.7*  --  11.1*  --   --   HGB 11.0*  --  9.8*  --   --   HCT 33.0*  --  29.6*  --   --   NA 136  < > 136 134* 134*  K 3.7  < > 4.4 3.7 3.8  CL 105  < > 106 105 104  CO2 25  < > 26 24 24   BUN 24*  < > 23* 22* 23*  CREATININE 2.42*  < > 2.15* 1.94* 2.12*  < > = values in this interval not displayed.  Microbiology: Results for orders placed or performed during the hospital encounter of 02-12-2017  Blood Culture (routine x 2)     Status: None (Preliminary result)   Collection Time: February 12, 2017 10:27 AM  Result Value Ref Range Status   Specimen Description BLOOD LEFT ARM  Final   Special Requests BOTTLES DRAWN AEROBIC AND ANAEROBIC ANA2ML AER3ML  Final    Culture NO GROWTH 4 DAYS  Final   Report Status PENDING  Incomplete  C difficile quick scan w PCR reflex     Status: None   Collection Time: 02-12-17 11:28 AM  Result Value Ref Range Status   C Diff antigen NEGATIVE NEGATIVE Final   C Diff toxin NEGATIVE NEGATIVE Final   C Diff interpretation No C. difficile detected.  Final  Gastrointestinal Panel by PCR , Stool     Status: None   Collection Time: 02/12/2017 11:28 AM  Result Value Ref Range Status   Campylobacter species NOT DETECTED NOT DETECTED Final   Plesimonas shigelloides NOT DETECTED NOT DETECTED Final   Salmonella species NOT DETECTED NOT DETECTED Final   Yersinia enterocolitica NOT DETECTED NOT DETECTED Final   Vibrio species NOT DETECTED NOT DETECTED Final   Vibrio cholerae NOT DETECTED NOT DETECTED Final   Enteroaggregative E coli (EAEC) NOT DETECTED NOT DETECTED Final   Enteropathogenic E coli (EPEC) NOT DETECTED NOT DETECTED Final   Enterotoxigenic E coli (ETEC) NOT DETECTED NOT DETECTED Final   Shiga like toxin producing E coli (STEC) NOT DETECTED NOT DETECTED Final   Shigella/Enteroinvasive E coli (EIEC) NOT DETECTED NOT DETECTED Final   Cryptosporidium NOT DETECTED NOT DETECTED Final   Cyclospora cayetanensis NOT DETECTED NOT DETECTED Final   Entamoeba histolytica NOT DETECTED NOT DETECTED Final   Giardia lamblia NOT DETECTED NOT DETECTED Final   Adenovirus F40/41 NOT DETECTED NOT DETECTED Final   Astrovirus NOT DETECTED NOT DETECTED Final   Norovirus GI/GII NOT DETECTED NOT DETECTED Final   Rotavirus A NOT DETECTED NOT DETECTED Final   Sapovirus (I, II, IV, and V) NOT DETECTED NOT DETECTED Final  Blood Culture (routine x 2)     Status: None (Preliminary result)   Collection Time: 2017/02/12 11:28 AM  Result Value Ref Range Status   Specimen Description BLOOD RIGHT PICC  Final   Special Requests BOTTLES DRAWN AEROBIC AND ANAEROBIC ANA7ML AER9ML  Final   Culture NO GROWTH 4 DAYS  Final   Report Status PENDING   Incomplete  Urine culture     Status: None   Collection Time: February 12, 2017 12:10 PM  Result Value Ref Range Status   Specimen Description URINE, RANDOM  Final   Special Requests NONE  Final   Culture   Final    NO GROWTH Performed at [facility], [address], [place], [place] [address]    Report Status 01/22/2017  FINAL  Final  MRSA PCR Screening     Status: None   Collection Time: 01/31/2017  6:07 PM  Result Value Ref Range Status   MRSA by PCR NEGATIVE NEGATIVE Final    Comment:        The GeneXpert MRSA Assay (FDA approved for NASAL specimens only), is one component of a comprehensive MRSA colonization surveillance program. It is not intended to diagnose MRSA infection nor to guide or monitor treatment for MRSA infections.     Studies/Results: Dg Chest Port 1 View  Result Date: 01/25/2017 CLINICAL DATA:  Respiratory failure, sepsis, CHF. EXAM: PORTABLE CHEST 1 VIEW COMPARISON:  Portable chest x-ray of January 22, 2017 FINDINGS: The lungs are adequately inflated. The retrocardiac region on the left is more dense today with total obscuration of the hemidiaphragm. There is increased density in the right infrahilar region but the hemidiaphragm remains visible. The cardiac silhouette is enlarged. The central pulmonary vascularity is prominent. The right-sided PICC line tip in the right sided large caliber internal jugular venous catheter tips project over the midportion of the SVC. IMPRESSION: Worsening of left lower lobe atelectasis or pneumonia. Probable small left pleural effusion. Electronically Signed   By: Emylee Decelle  [place] M.D.   On: 01/25/2017 06:40    Assessment/Plan: [name] is a 56 y.o. male readmitted with sepsis and ARF following D/C a week ago on IV ctx and oral flagyl for a severe sacral decub. On admission the wound appeared  clean and I think would be unlikley source of his presentation. Main issue seems to be his ARF which may be related to his recent  NV So far BCX UCx, MRSA PCR and C diff, Stool PCR neg.  2/13 - is more alert and weaning off pressors today  Rec Cont ctx and flagyl.   Continue care per CCM Can dc fluconazole Thank you very much for the consult. Will follow with you.  Shantina Chronister P   01/25/2017, 3:46 PM

## 2017-01-25 NOTE — Progress Notes (Signed)
[facility]  ROUNDING NOTE   Subjective:   remains on CRRT UOP 20 c Net UF 1700 cc with CRRT Currently UF 75 cc/hr BP is low in 90's Vasopressin is off Norepinephrine lower dose at 10 mcg Sitting up in bed eating breakfast   Objective:  Vital signs in last 24 hours:  Temp:  [97.4 F (36.3 C)-98 F (36.7 C)] 97.6 F (36.4 C) (02/13 0700) Pulse Rate:  [63-93] 63 (02/13 0700) Resp:  [7-14] 11 (02/13 0700) BP: (63-121)/(49-91) 104/75 (02/13 0700) SpO2:  [89 %-100 %] 98 % (02/13 0808) Weight:  [131.5 kg (289 lb 14.5 oz)] 131.5 kg (289 lb 14.5 oz) (02/13 0400)  Weight change: -1.1 kg (-2 lb 6.8 oz) Filed Weights   01/23/17 0400 01/24/17 0500 01/25/17 0400  Weight: 135.5 kg (298 lb 11.6 oz) 132.6 kg (292 lb 5.3 oz) 131.5 kg (289 lb 14.5 oz)    Intake/Output: I/O last 3 completed shifts: In: 2887.9 [P.O.:474; I.V.:1363.9; IV Piggyback:1050] Out: 2567 [Urine:30; Other:2537]   Intake/Output this shift:  Total I/O In: -  Out: 73 [Other:73]  Physical Exam: General: Critically ill , sitting up in bed  Head: Normocephalic, atraumatic. Moist oral mucosal membranes  Eyes: Anicteric,    Neck: Supple,   Lungs:  Clear to auscultation  Heart: Regular rate and rhythm  Abdomen:  Soft, nontender, obese, distended   Extremities:  2+ dependent peripheral edema.  Neurologic: Alert and oriented  Skin: Scattered bruising  Access: Right IJ temp HD catheter 2/9    Basic Metabolic Panel:  Recent Labs Lab 01/24/17 0630 01/24/17 1116 01/24/17 1505 01/24/17 1848 01/24/17 2302 01/25/17 0458  NA 136 138 136 134* 134* 136  K 3.7 3.8 4.3 4.4 4.5 4.4  CL 105 106 106 105 104 106  CO2 25 26 26 24 23 26   GLUCOSE 143* 147* 163* 178* 180* 215*  BUN 24* 25* 23* 22* 23* 23*  CREATININE 2.42* 2.51* 2.21* 2.24* 2.12* 2.15*  CALCIUM 8.3* 8.6* 8.7* 8.4* 8.6* 8.8*  MG 2.0 1.9  --  1.7 1.7 1.8  PHOS  --  2.9 2.8 2.9 2.9 2.3*    Liver Function Tests:  Recent Labs Lab  02/04/2017 1027 01/20/2017 1946  01/24/17 1116 01/24/17 1505 01/24/17 1848 01/24/17 2302 01/25/17 0458  AST 31 29  --   --   --   --   --  39  ALT 17 17  --   --   --   --   --  19  ALKPHOS 102  --   --   --   --   --   --  79  BILITOT 1.1  --   --   --   --   --   --  0.8  PROT 6.5  --   --   --   --   --   --  5.7*  ALBUMIN 2.0* 1.8*  < > 1.7* 2.1* 2.1* 2.1* 2.1*  < > = values in this interval not displayed.  Recent Labs Lab 02/03/2017 1027  LIPASE 46    Recent Labs Lab 01/22/17 1034  AMMONIA 56*    CBC:  Recent Labs Lab 01/24/2017 1027 01/22/17 0535 01/23/17 0438 01/23/17 2229 01/24/17 0630 01/25/17 0458  WBC 8.0 12.9* 12.8* 12.7* 11.7* 11.1*  NEUTROABS 6.8*  --   --   --   --   --   HGB 13.1 12.5* 12.2* 11.5* 11.0* 9.8*  HCT 39.2* 38.5* 36.3* 34.0*  33.0* 29.6*  MCV 101.8* 103.4* 101.4* 101.2* 102.4* 102.4*  PLT 122* 125* 94* 88* 80* 77*    Cardiac Enzymes:  Recent Labs Lab 01/26/2017 1027 02/08/2017 1642 02/05/2017 1946 01/19/2017 2248 01/22/17 0239  TROPONINI 0.06* 0.03* 0.04* 0.04* 0.03*    BNP: Invalid input(s): POCBNP  CBG:  Recent Labs Lab 01/20/2017 1536  GLUCAP 98    Microbiology: Results for orders placed or performed during the hospital encounter of 01/28/2017  Blood Culture (routine x 2)     Status: None (Preliminary result)   Collection Time: 01/20/2017 10:27 AM  Result Value Ref Range Status   Specimen Description BLOOD LEFT ARM  Final   Special Requests BOTTLES DRAWN AEROBIC AND ANAEROBIC ANA2ML AER3ML  Final   Culture NO GROWTH 4 DAYS  Final   Report Status PENDING  Incomplete  C difficile quick scan w PCR reflex     Status: None   Collection Time: 02/01/2017 11:28 AM  Result Value Ref Range Status   C Diff antigen NEGATIVE NEGATIVE Final   C Diff toxin NEGATIVE NEGATIVE Final   C Diff interpretation No C. difficile detected.  Final  Gastrointestinal Panel by PCR , Stool     Status: None   Collection Time: 01/16/2017 11:28 AM  Result Value  Ref Range Status   Campylobacter species NOT DETECTED NOT DETECTED Final   Plesimonas shigelloides NOT DETECTED NOT DETECTED Final   Salmonella species NOT DETECTED NOT DETECTED Final   Yersinia enterocolitica NOT DETECTED NOT DETECTED Final   Vibrio species NOT DETECTED NOT DETECTED Final   Vibrio cholerae NOT DETECTED NOT DETECTED Final   Enteroaggregative E coli (EAEC) NOT DETECTED NOT DETECTED Final   Enteropathogenic E coli (EPEC) NOT DETECTED NOT DETECTED Final   Enterotoxigenic E coli (ETEC) NOT DETECTED NOT DETECTED Final   Shiga like toxin producing E coli (STEC) NOT DETECTED NOT DETECTED Final   Shigella/Enteroinvasive E coli (EIEC) NOT DETECTED NOT DETECTED Final   Cryptosporidium NOT DETECTED NOT DETECTED Final   Cyclospora cayetanensis NOT DETECTED NOT DETECTED Final   Entamoeba histolytica NOT DETECTED NOT DETECTED Final   Giardia lamblia NOT DETECTED NOT DETECTED Final   Adenovirus F40/41 NOT DETECTED NOT DETECTED Final   Astrovirus NOT DETECTED NOT DETECTED Final   Norovirus GI/GII NOT DETECTED NOT DETECTED Final   Rotavirus A NOT DETECTED NOT DETECTED Final   Sapovirus (I, II, IV, and V) NOT DETECTED NOT DETECTED Final  Blood Culture (routine x 2)     Status: None (Preliminary result)   Collection Time: 01/15/2017 11:28 AM  Result Value Ref Range Status   Specimen Description BLOOD RIGHT PICC  Final   Special Requests BOTTLES DRAWN AEROBIC AND ANAEROBIC ANA7ML AER9ML  Final   Culture NO GROWTH 4 DAYS  Final   Report Status PENDING  Incomplete  Urine culture     Status: None   Collection Time: 02/02/2017 12:10 PM  Result Value Ref Range Status   Specimen Description URINE, RANDOM  Final   Special Requests NONE  Final   Culture   Final    NO GROWTH Performed at [facility], [address] [place], [place] [address]    Report Status 01/22/2017 FINAL  Final  MRSA PCR Screening     Status: None   Collection Time: 02/02/2017  6:07 PM  Result Value Ref Range Status    MRSA by PCR NEGATIVE NEGATIVE Final    Comment:        The GeneXpert  MRSA Assay (FDA approved for NASAL specimens only), is one component of a comprehensive MRSA colonization surveillance program. It is not intended to diagnose MRSA infection nor to guide or monitor treatment for MRSA infections.     Coagulation Studies: No results for input(s): LABPROT, INR in the last 72 hours.  Urinalysis: No results for input(s): COLORURINE, LABSPEC, PHURINE, GLUCOSEU, HGBUR, BILIRUBINUR, KETONESUR, PROTEINUR, UROBILINOGEN, NITRITE, LEUKOCYTESUR in the last 72 hours.  Invalid input(s): APPERANCEUR    Imaging: Dg Chest Port 1 View  Result Date: 01/25/2017 CLINICAL DATA:  Respiratory failure, sepsis, CHF. EXAM: PORTABLE CHEST 1 VIEW COMPARISON:  Portable chest x-ray of January 22, 2017 FINDINGS: The lungs are adequately inflated. The retrocardiac region on the left is more dense today with total obscuration of the hemidiaphragm. There is increased density in the right infrahilar region but the hemidiaphragm remains visible. The cardiac silhouette is enlarged. The central pulmonary vascularity is prominent. The right-sided PICC line tip in the right sided large caliber internal jugular venous catheter tips project over the midportion of the SVC. IMPRESSION: Worsening of left lower lobe atelectasis or pneumonia. Probable small left pleural effusion. Electronically Signed   By: [name]  [place] M.D.   On: 01/25/2017 06:40     Medications:   . norepinephrine (LEVOPHED) Adult infusion 10 mcg/min (01/25/17 0747)  . pureflow 1,000 mL (01/25/17 0031)  . vasopressin (PITRESSIN) infusion - *FOR SHOCK* Stopped (01/25/17 0802)   . albumin human  12.5 g Intravenous BID  . cefTRIAXone  2 g Intravenous Q24H  . feeding supplement (ENSURE ENLIVE)  237 mL Oral TID BM  . fluconazole (DIFLUCAN) IV  400 mg Intravenous Q24H  . folic acid  1 mg Intravenous Daily  . hydrocerin   Topical BID  . hydrocortisone sod  succinate (SOLU-CORTEF) inj  50 mg Intravenous Q8H  . ipratropium-albuterol  3 mL Nebulization Q6H  . mouth rinse  15 mL Mouth Rinse BID  . metronidazole  500 mg Intravenous Q8H  . midodrine  10 mg Oral BID WC  . pantoprazole (PROTONIX) IV  40 mg Intravenous Q24H  . sodium chloride flush  10-40 mL Intracatheter Q12H   sodium chloride, alteplase, alum & mag hydroxide-simeth, heparin, ondansetron (ZOFRAN) IV, sodium chloride flush  Assessment/ Plan:  Mr. [name] is a 56 y.o. white male with decubitus ulcer, hypertension, congestive heart failure, atrial fibrillation, depression who was admitted to [facility] from 1/14 to 2/2 for sepsis. He was discharged with metronidazole and ceftriaxone for a total of six weeks. Patient was admitted to [facility] on 2017-02-11 with sepsis.   1. Acute renal failure:  anuric. On vasopressors. Baseline creatinine of 0.86 on 01/11/17 Initiated on CRRT on 2/9. CVVHD 4K bath. Therapy rate 2000, BFR 300, UF 75 - renally dose all medications - monitor renal function, electrolytes, urine output and volume status.  - UF 1700 cc last 24 hrs -  Still on levophed; may be able to d/c CRRT in the next 24 hrs  2. Sepsis: hypotension. On vasopressors. Secondary to sacral decubitus ulcer infection.  - empiric antibiotics: ceftriaxone and metronidazole. - Continue supportive care.   3. Anasarca/Edema - 3rd spacing from low albumin - iv albumin supplementation x 3 days  4. Hyponatremia: improved with CRRT.    LOS: 4 Jetaun Colbath 2/13/20189:07 AM

## 2017-01-25 NOTE — Progress Notes (Signed)
Pt has remained alert and oriented x 4 with no c/o pain. NSR on cardiac monitor. 2 runs of V tach-Dr Sung AmabileSimonds made aware. P+ replaced at 1.7. [id number], SpO2 >90%. RR even and unlabored. Regular diet ordered instead of renal to acct for P+ depletion. Pt has had 1 loose, tarry stool this shift. Pt has remained on CRRT-no complications to note.

## 2017-01-25 NOTE — Progress Notes (Addendum)
Much more awake and alert. Oriented. No distress. Vasopressor requirements improving  Vitals:   01/25/17 0500 01/25/17 0600 01/25/17 0700 01/25/17 0808  BP: 95/63 95/75 104/75   Pulse: 87 67 63   Resp: 13 11 11    Temp:   97.6 F (36.4 C)   TempSrc:   Oral   SpO2: 95% 100% 98% 98%  Weight:      Height:       Chronically ill appearing, NAD HEENT WNL Clear anteriorly RRR s M NABS, soft Ext warm, Severe LE edema with chronic stasis changes  BMP Latest Ref Rng & Units 01/25/2017 01/25/2017 01/24/2017  Glucose 65 - 99 mg/dL 045(W244(H) 098(J215(H) 191(Y180(H)  BUN 6 - 20 mg/dL 78(G22(H) 95(A23(H) 21(H23(H)  Creatinine 0.61 - 1.24 mg/dL 0.86(V1.94(H) 7.84(O2.15(H) 9.62(X2.12(H)  Sodium 135 - 145 mmol/L 134(L) 136 134(L)  Potassium 3.5 - 5.1 mmol/L 3.7 4.4 4.5  Chloride 101 - 111 mmol/L 105 106 104  CO2 22 - 32 mmol/L 24 26 23   Calcium 8.9 - 10.3 mg/dL 8.3(L) 8.8(L) 8.6(L)   CBC Latest Ref Rng & Units 01/25/2017 01/24/2017 01/23/2017  WBC 3.8 - 10.6 K/uL 11.1(H) 11.7(H) 12.7(H)  Hemoglobin 13.0 - 18.0 g/dL 5.2(W9.8(L) 11.0(L) 11.5(L)  Hematocrit 40.0 - 52.0 % 29.6(L) 33.0(L) 34.0(L)  Platelets 150 - 440 K/uL 77(L) 80(L) 88(L)   CXR: probable bilateral effusions  IMPRESSION: 1) Hypoxic respiratory failure, improving 2) Probable B pleural effusion - likely due to general volume overload 3) Wheezing, resolved 4) Shock - likely septic in origin - vasopressor requirements improving 5) Severe sepsis due to sacral pressure ulcer 6)  AKI, anuric. On CRRT - will likely need long term HD 7) Alcoholic cirrhosis 8) Mild anemia 9) Thrombocytopenia 10) Acute encephalopathy - much improved 11) Chronic, severe debilitation  PLAN/REC: 1) Continue supplemental O2 2) Cont scheduled bronchodilators 3) Cont midodrine - dose adjusted 02/13. Cont hydrocortisone - dose adjusted 02/13 4) Wean norepinephrine to off for MAP > 60 mmHg 5) CRRT per Renal Service 6) Abx per ID service 7) I will meet with pt and HCPOA today.  Billy Fischeravid Virlee Stroschein, MD PCCM  service Mobile [phone] Pager [phone] 01/25/2017   ADD: I spoke with pt and his GF who is HCPOA. We all agree that we should continue our current efforts but not escalate our level of care significantly. He will consider the issue of long term HD as decisions re: that question approach. If all else was getting better, he would consider undertaking long term HD. However, he wishes to forgo ACLS and mechanical ventilation in the event of cardiac or respiratory arrest.  Billy Fischeravid Alonah Lineback, MD PCCM service Mobile [phone] Pager [phone] 01/25/2017

## 2017-01-26 DIAGNOSIS — K921 Melena: Secondary | ICD-10-CM

## 2017-01-26 LAB — PROTIME-INR
INR: 1.79
Prothrombin Time: 21 seconds — ABNORMAL HIGH (ref 11.4–15.2)

## 2017-01-26 LAB — RENAL FUNCTION PANEL
ALBUMIN: 2.2 g/dL — AB (ref 3.5–5.0)
ANION GAP: 4 — AB (ref 5–15)
ANION GAP: 3 — AB (ref 5–15)
Albumin: 2.4 g/dL — ABNORMAL LOW (ref 3.5–5.0)
Albumin: 2.8 g/dL — ABNORMAL LOW (ref 3.5–5.0)
Albumin: 2.2 g/dL — ABNORMAL LOW (ref 3.5–5.0)
Anion gap: 4 — ABNORMAL LOW (ref 5–15)
Anion gap: 4 — ABNORMAL LOW (ref 5–15)
BUN: 22 mg/dL — AB (ref 6–20)
BUN: 20 mg/dL (ref 6–20)
BUN: 20 mg/dL (ref 6–20)
BUN: 19 mg/dL (ref 6–20)
CALCIUM: 8.4 mg/dL — AB (ref 8.9–10.3)
CALCIUM: 7.6 mg/dL — AB (ref 8.9–10.3)
CHLORIDE: 106 mmol/L (ref 101–111)
CHLORIDE: 107 mmol/L (ref 101–111)
CO2: 27 mmol/L (ref 22–32)
CO2: 25 mmol/L (ref 22–32)
CO2: 24 mmol/L (ref 22–32)
CO2: 24 mmol/L (ref 22–32)
CREATININE: 1.92 mg/dL — AB (ref 0.61–1.24)
CREATININE: 1.86 mg/dL — AB (ref 0.61–1.24)
Calcium: 8.7 mg/dL — ABNORMAL LOW (ref 8.9–10.3)
Calcium: 8.6 mg/dL — ABNORMAL LOW (ref 8.9–10.3)
Chloride: 107 mmol/L (ref 101–111)
Chloride: 108 mmol/L (ref 101–111)
Creatinine, Ser: 1.86 mg/dL — ABNORMAL HIGH (ref 0.61–1.24)
Creatinine, Ser: 1.6 mg/dL — ABNORMAL HIGH (ref 0.61–1.24)
GFR calc Af Amer: 43 mL/min — ABNORMAL LOW (ref >60)
GFR calc Af Amer: 54 mL/min — ABNORMAL LOW (ref >60)
GFR calc non Af Amer: 39 mL/min — ABNORMAL LOW (ref >60)
GFR calc non Af Amer: 39 mL/min — ABNORMAL LOW (ref >60)
GFR calc non Af Amer: 47 mL/min — ABNORMAL LOW (ref >60)
GFR, EST AFRICAN AMERICAN: 45 mL/min — AB (ref >60)
GFR, EST AFRICAN AMERICAN: 45 mL/min — AB (ref >60)
GFR, EST NON AFRICAN AMERICAN: 37 mL/min — AB (ref >60)
GLUCOSE: 152 mg/dL — AB (ref 65–99)
GLUCOSE: 216 mg/dL — AB (ref 65–99)
Glucose, Bld: 228 mg/dL — ABNORMAL HIGH (ref 65–99)
Glucose, Bld: 194 mg/dL — ABNORMAL HIGH (ref 65–99)
PHOSPHORUS: 2.4 mg/dL — AB (ref 2.5–4.6)
PHOSPHORUS: 2.1 mg/dL — AB (ref 2.5–4.6)
POTASSIUM: 4 mmol/L (ref 3.5–5.1)
POTASSIUM: 3.9 mmol/L (ref 3.5–5.1)
POTASSIUM: 3.5 mmol/L (ref 3.5–5.1)
Phosphorus: 2.1 mg/dL — ABNORMAL LOW (ref 2.5–4.6)
Phosphorus: 2.3 mg/dL — ABNORMAL LOW (ref 2.5–4.6)
Potassium: 3.8 mmol/L (ref 3.5–5.1)
SODIUM: 138 mmol/L (ref 135–145)
SODIUM: 135 mmol/L (ref 135–145)
Sodium: 135 mmol/L (ref 135–145)
Sodium: 135 mmol/L (ref 135–145)

## 2017-01-26 LAB — CULTURE, BLOOD (ROUTINE X 2)
Culture: NO GROWTH
Culture: NO GROWTH

## 2017-01-26 LAB — GLUCOSE, CAPILLARY
GLUCOSE-CAPILLARY: 164 mg/dL — AB (ref 65–99)
Glucose-Capillary: 125 mg/dL — ABNORMAL HIGH (ref 65–99)
Glucose-Capillary: 176 mg/dL — ABNORMAL HIGH (ref 65–99)
Glucose-Capillary: 193 mg/dL — ABNORMAL HIGH (ref 65–99)

## 2017-01-26 LAB — CBC
HCT: 26.8 % — ABNORMAL LOW (ref 40.0–52.0)
Hemoglobin: 8.6 g/dL — ABNORMAL LOW (ref 13.0–18.0)
MCH: 33.6 pg (ref 26.0–34.0)
MCHC: 32.2 g/dL (ref 32.0–36.0)
MCV: 104.2 fL — ABNORMAL HIGH (ref 80.0–100.0)
PLATELETS: 61 10*3/uL — AB (ref 150–440)
RBC: 2.57 MIL/uL — AB (ref 4.40–5.90)
RDW: 17.2 % — ABNORMAL HIGH (ref 11.5–14.5)
WBC: 12.7 10*3/uL — ABNORMAL HIGH (ref 3.8–10.6)

## 2017-01-26 LAB — MAGNESIUM
MAGNESIUM: 1.7 mg/dL (ref 1.7–2.4)
MAGNESIUM: 1.4 mg/dL — AB (ref 1.7–2.4)
Magnesium: 1.8 mg/dL (ref 1.7–2.4)
Magnesium: 1.6 mg/dL — ABNORMAL LOW (ref 1.7–2.4)
Magnesium: 1.4 mg/dL — ABNORMAL LOW (ref 1.7–2.4)

## 2017-01-26 LAB — IRON AND TIBC: Iron: 74 ug/dL (ref 45–182)

## 2017-01-26 LAB — OCCULT BLOOD X 1 CARD TO LAB, STOOL: FECAL OCCULT BLD: POSITIVE — AB

## 2017-01-26 LAB — FERRITIN: Ferritin: 1235 ng/mL — ABNORMAL HIGH (ref 24–336)

## 2017-01-26 LAB — APTT: aPTT: 43 seconds — ABNORMAL HIGH (ref 24–36)

## 2017-01-26 MED ORDER — AMIODARONE HCL IN DEXTROSE 360-4.14 MG/200ML-% IV SOLN: 30.0000 mg/h | INTRAVENOUS | Status: DC

## 2017-01-26 MED ORDER — AMIODARONE IV BOLUS ONLY 150 MG/100ML
150.0000 mg | Freq: Once | INTRAVENOUS | Status: AC
Start: 2017-01-26 — End: 2017-01-26
  Administered 2017-01-26: 150 mg via INTRAVENOUS
  Filled 2017-01-26: qty 100

## 2017-01-26 MED ORDER — SODIUM PHOSPHATES 45 MMOLE/15ML IV SOLN
20.0000 mmol | Freq: Once | INTRAVENOUS | Status: AC
  Administered 2017-01-26: 20 mmol via INTRAVENOUS
  Filled 2017-01-26: qty 6.67

## 2017-01-26 MED ORDER — STERILE WATER FOR INJECTION IJ SOLN
INTRAMUSCULAR | Status: AC
  Administered 2017-01-26: 10 mL
  Filled 2017-01-26: qty 10

## 2017-01-26 MED ORDER — PANTOPRAZOLE SODIUM 40 MG IV SOLR
40.0000 mg | Freq: Two times a day (BID) | INTRAVENOUS | Status: DC
  Administered 2017-01-26 – 2017-01-27 (×3): 40 mg via INTRAVENOUS
  Filled 2017-01-26 (×3): qty 40

## 2017-01-26 MED ORDER — SODIUM CHLORIDE 0.9 % IV SOLN
50.0000 ug/h | INTRAVENOUS | Status: DC
  Administered 2017-01-26 – 2017-02-01 (×11): 50 ug/h via INTRAVENOUS
  Filled 2017-01-26 (×22): qty 1

## 2017-01-26 MED ORDER — VITAMIN K1 10 MG/ML IJ SOLN
10.0000 mg | Freq: Every day | INTRAMUSCULAR | Status: AC
  Administered 2017-01-26 – 2017-01-28 (×3): 10 mg via SUBCUTANEOUS
  Filled 2017-01-26 (×3): qty 1

## 2017-01-26 MED ORDER — OCTREOTIDE LOAD VIA INFUSION
50.0000 ug | Freq: Once | INTRAVENOUS | Status: AC
  Administered 2017-01-26: 50 ug via INTRAVENOUS
  Filled 2017-01-26: qty 25

## 2017-01-26 MED ORDER — SUCRALFATE 1 G PO TABS
1.0000 g | ORAL_TABLET | Freq: Three times a day (TID) | ORAL | Status: DC
  Administered 2017-01-26 – 2017-02-08 (×50): 1 g via ORAL
  Filled 2017-01-26 (×53): qty 1

## 2017-01-26 MED ORDER — AMIODARONE HCL IN DEXTROSE 360-4.14 MG/200ML-% IV SOLN
60.0000 mg/h | INTRAVENOUS | Status: DC
  Administered 2017-01-26: 60 mg/h via INTRAVENOUS
  Filled 2017-01-26: qty 200

## 2017-01-26 NOTE — Progress Notes (Signed)
INR elevated- vitamin K ordered  Dr Wyline MoodKiran Nola Botkins  Gastroenterology/Hepatology Pager: [phone]

## 2017-01-26 NOTE — Progress Notes (Signed)
eLink Physician-Brief Progress Note Patient Name: [name] RouteMichael J [name] DOB: 12/13/1961 MRN: [id number]   Date of Service  01/26/2017  HPI/Events of Note  PO4--- = 2.3 and Creatinine = 1.86.  eICU Interventions  Will cautiously replace PO4---.     Intervention Category Major Interventions: Electrolyte abnormality - evaluation and management  Sommer,Steven Eugene 01/26/2017, 6:06 PM

## 2017-01-26 NOTE — Consult Note (Addendum)
Wyline MoodKiran Fedra Lanter MD  [address] [place], [place] [address] Phone: [phone] Fax : [phone]  Consultation  Referring Provider:     No ref. provider found Primary Care Physician:  Corky DownsMASOUD,[name], MD Primary Gastroenterologist:  None          Reason for Consultation:     Melena  Date of Admission:  02/08/2017 Date of Consultation:  01/26/2017         HPI:   [name] is a 56 y.o. male with history of alcoholic liver cirrhosis and recently stopped drinking . He has a few recent hospital admission for infected decubitus ulcers. He was also admitted in 12/18/16 when we were consulted for an UGI bleed. He underwent an egd and was found to have no gastric varices or esophageal varices and only had moderate  portal hypertensive gastropathy.   This admission he was admitted on 01/22/17 with acute respiratory failure , shock and renal failure.   He has been having 1-2 dark bowel movements a day last few days , he complains of chest discomfort when he swallows food. Denies any hematemesis. He is on pressors, has left lower lobe infiltrates on x ray and has low blood pressure.     Past Medical History:  Diagnosis Date  . Anal fissure   . Atrial fibrillation (HCC)   . CHF (congestive heart failure) (HCC)   . Heart murmur   . Hypertension   . Lymphadenopathy   . Personal history of colonic polyps   . Pressure ulcer, sacrum     Past Surgical History:  Procedure Laterality Date  . COLONOSCOPY  2012  . ESOPHAGOGASTRODUODENOSCOPY (EGD) WITH PROPOFOL N/A 12/18/2016   Procedure: ESOPHAGOGASTRODUODENOSCOPY (EGD) WITH PROPOFOL;  Surgeon: Toney Reilohini Reddy Vanga, MD;  Location: [facility] ENDOSCOPY;  Service: Endoscopy;  Laterality: N/A;  . HERNIA REPAIR  2012  . larynx-amyloidosis-laser surgery   2010    Prior to Admission medications   Medication Sig Start Date End Date Taking? Authorizing Provider  benazepril (LOTENSIN) 40 MG tablet Take 1 tablet (40 mg total) by mouth daily. 12/21/16  Yes Vipul Sherryll BurgerShah,  MD  cefTRIAXone 2 g in dextrose 5 % 50 mL Inject 2 g into the vein daily. 01/14/17 02/15/17 Yes Houston SirenVivek J Sainani, MD  citalopram (CELEXA) 10 MG tablet Take 1 tablet (10 mg total) by mouth at bedtime. 01/14/17  Yes Houston SirenVivek J Sainani, MD  DAKINS EX Apply 1 application topically. Everyday and every evening   Yes Historical Provider, MD  feeding supplement, ENSURE ENLIVE, (ENSURE ENLIVE) LIQD Take 237 mLs by mouth 2 (two) times daily with a meal. 01/14/17  Yes Houston SirenVivek J Sainani, MD  folic acid (FOLVITE) 1 MG tablet Take 1 tablet (1 mg total) by mouth daily. 12/21/16  Yes Vipul Sherryll BurgerShah, MD  furosemide (LASIX) 40 MG tablet Take 0.5 tablets (20 mg total) by mouth daily. 01/14/17  Yes Houston SirenVivek J Sainani, MD  metroNIDAZOLE (FLAGYL) 500 MG tablet Take 1 tablet (500 mg total) by mouth 3 (three) times daily. Patient taking differently: Take 500 mg by mouth 3 (three) times daily. [id number] 01/14/17 02/15/17 Yes Houston SirenVivek J Sainani, MD  midodrine (PROAMATINE) 5 MG tablet Take 1 tablet (5 mg total) by mouth 3 (three) times daily with meals. Patient taking differently: Take 5 mg by mouth 3 (three) times daily with meals. [id number] 01/14/17  Yes Houston SirenVivek J Sainani, MD  naproxen sodium (ALEVE) 220 MG tablet Take 440 mg by mouth 2 (two) times daily with a meal.   Yes Historical  Provider, MD  oxyCODONE (OXY IR/ROXICODONE) 5 MG immediate release tablet Take 1 tablet (5 mg total) by mouth every 6 (six) hours as needed for moderate pain or severe pain. 01/14/17  Yes Houston Siren, MD  potassium chloride (K-DUR,KLOR-CON) 10 MEQ tablet Take 10 mEq by mouth daily.    Yes Historical Provider, MD  promethazine (PHENERGAN) 25 MG/ML injection Inject 25 mg into the vein once.   Yes Historical Provider, MD  sotalol (BETAPACE) 80 MG tablet Take 1 tablet (80 mg total) by mouth 2 (two) times daily. Patient taking differently: Take 80 mg by mouth 2 (two) times daily. [id number],9604 12/21/16  Yes Delfino Lovett, MD  spironolactone (ALDACTONE) 25 MG tablet Take 0.5 tablets  (12.5 mg total) by mouth daily. 01/15/17  Yes Houston Siren, MD    Family History  Problem Relation Age of Onset  . Hypertension Other   . Hypertension Brother      Social History  Substance Use Topics  . Smoking status: Current Every Day Smoker    Packs/day: 1.00    Years: 20.00  . Smokeless tobacco: Never Used  . Alcohol use Yes    Allergies as of 02/12/2017 - Review Complete February 12, 2017  Allergen Reaction Noted  . Clindamycin/lincomycin Diarrhea 04/17/2016    Review of Systems:    All systems reviewed and negative except where noted in HPI.   Physical Exam:  Vital signs in last 24 hours: Temp:  [97.5 F (36.4 C)-98.6 F (37 C)] 97.5 F (36.4 C) (02/14 0800) Pulse Rate:  [68-97] 97 (02/14 0900) Resp:  [9-23] 14 (02/14 0900) BP: (69-121)/(52-102) 86/68 (02/14 0900) SpO2:  [93 %-97 %] 94 % (02/14 0900) Weight:  [297 lb 6.4 oz (134.9 kg)] 297 lb 6.4 oz (134.9 kg) (02/14 0400) Last BM Date: 01/25/17 General:   Pleasant, cooperative in NAD, sitting up in bed and talking  Head:  Normocephalic and atraumatic. Eyes:   No icterus.   Conjunctiva pink. PERRLA. Ears:  Normal auditory acuity. Neck:  Supple; no masses or thyroidomegaly Lungs:decreased air entry b/l  Heart:  Regular rate and rhythm;  Without murmur, clicks, rubs or gallops Abdomen:  Soft, generalized mildly distended, nontender. Normal bowel sounds. No appreciable masses or hepatomegaly.  No rebound or guarding.  Msk:  Symmetrical without gross deformities.  Extremities:  B/l severe pedal edema  Neurologic:  Alert and oriented x3;  grossly normal neurologically. Skin:  Intact without significant lesions or rashes. Cervical Nodes:  No significant cervical adenopathy. Psych:  Alert and cooperative. Normal affect.  LAB RESULTS:  Recent Labs  01/24/17 0630 01/25/17 0458 01/26/17 0422  WBC 11.7* 11.1* 12.7*  HGB 11.0* 9.8* 8.6*  HCT 33.0* 29.6* 26.8*  PLT 80* 77* 61*   BMET  Recent Labs   01/25/17 1808 01/25/17 2235 01/26/17 0422  NA 134* 135 138  K 4.0 4.5 4.0  CL 105 106 107  CO2 24 25 27   GLUCOSE 264*  262* 179* 152*  BUN 23* 21* 22*  CREATININE 2.04* 1.86* 1.92*  CALCIUM 8.4* 8.4* 8.7*   LFT  Recent Labs  01/25/17 0458  01/26/17 0422  PROT 5.7*  --   --   ALBUMIN 2.1*  < > 2.2*  AST 39  --   --   ALT 19  --   --   ALKPHOS 79  --   --   BILITOT 0.8  --   --   < > = values in this interval not displayed. PT/INR No  results for input(s): LABPROT, INR in the last 72 hours.  STUDIES: Dg Chest Port 1 View  Result Date: 01/25/2017 CLINICAL DATA:  Respiratory failure, sepsis, CHF. EXAM: PORTABLE CHEST 1 VIEW COMPARISON:  Portable chest x-ray of January 22, 2017 FINDINGS: The lungs are adequately inflated. The retrocardiac region on the left is more dense today with total obscuration of the hemidiaphragm. There is increased density in the right infrahilar region but the hemidiaphragm remains visible. The cardiac silhouette is enlarged. The central pulmonary vascularity is prominent. The right-sided PICC line tip in the right sided large caliber internal jugular venous catheter tips project over the midportion of the SVC. IMPRESSION: Worsening of left lower lobe atelectasis or pneumonia. Probable small left pleural effusion. Electronically Signed   By: [name]  [place] M.D.   On: 01/25/2017 06:40      Impression / Plan:   SLATER MCMANAMAN is a 56 y.o. 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 Admitted with shock, sepsis, AKI, respiratory failure a few days back. I have been consulted for anemia and tarry black stool .   It is very likely he is bleeding slowly from the portal hypertensive gastropathy .   Plan  1. Continue IV PPI, oral sucralfate . No NSAID;s 2. Low blood pressure would not permit use of betablocker 3. Trial of  octreotide IV 4. Check INR and if elevated can attempt to correct with Vitamin K  5. At this time his low blood pressure and respiratory status would not permit anesthesia for any endoscopy at this time , can be considered once his blood pressure and respiratory status improves. .  6. Monitor CBC and transfuse as needed  7. Will order iron studies    Thank you for involving me in the care of this patient.      LOS: 5 days   Wyline Mood, MD  01/26/2017, 10:33 AM

## 2017-01-26 NOTE — Progress Notes (Signed)
Pt has remained alert and oriented with no c/o pain. NSR on cardiac monitor. Pt remains on Levo currently at 5 mcgs. RR even and unlabored on RA. Lung sounds clear to auscultation. Pt has remained on CRRT. Filter changed x1 d/t clotting-no complications since. Access pressures remain unavailable. Pt remains anuric. Pt has been changed to a partial code - No to intubation, yes to ACLS.  GI consulted for persistent black, tarry stool x 1 this shift. Pt with c/o fullness in lower chest, esp with eating. Pt with c/o swallowing. Swallow eval appropriate.

## 2017-01-26 NOTE — Progress Notes (Signed)
[facility]  ROUNDING NOTE   Subjective:   remains on CRRT, System has had clotting issues today.  UOP 45cc  Net UF 1300 cc with CRRT Currently UF 75 cc/hr BP is in 100's Vasopressin is off Norepinephrine lower dose at 10 mcg Sitting up in bed. Complains of dysphagia and having black tarry stools. GI following.   Objective:  Vital signs in last 24 hours:  Temp:  [97.5 F (36.4 C)-98.6 F (37 C)] 97.5 F (36.4 C) (02/14 0800) Pulse Rate:  [68-97] 97 (02/14 0900) Resp:  [9-23] 14 (02/14 0900) BP: (69-121)/(52-102) 86/68 (02/14 0900) SpO2:  [93 %-97 %] 94 % (02/14 0900) Weight:  [134.9 kg (297 lb 6.4 oz)] 134.9 kg (297 lb 6.4 oz) (02/14 0400)  Weight change: 3.4 kg (7 lb 7.9 oz) Filed Weights   01/24/17 0500 01/25/17 0400 01/26/17 0400  Weight: 132.6 kg (292 lb 5.3 oz) 131.5 kg (289 lb 14.5 oz) 134.9 kg (297 lb 6.4 oz)    Intake/Output: I/O last 3 completed shifts: In: 3458.5 [P.O.:1394; I.V.:714.5; IV Piggyback:1350] Out: 2239 [Urine:65; Other:2174]   Intake/Output this shift:  Total I/O In: 260 [P.O.:240; I.V.:20] Out: 217 [Other:217]  Physical Exam: General: Critically ill , sitting up in bed  Head: Normocephalic, atraumatic. Moist oral mucosal membranes  Eyes: Anicteric,    Neck: Supple,   Lungs:  Clear to auscultation  Heart: Regular rate and rhythm, tachycardic  Abdomen:  Soft, nontender, obese, distended   Extremities:  2+ dependent peripheral edema.  Neurologic: Alert and oriented  Skin: Scattered bruising  Access: Right IJ temp HD catheter 2/9    Basic Metabolic Panel:  Recent Labs Lab 01/25/17 1119 01/25/17 1415 01/25/17 1808 01/25/17 2235 01/26/17 0422 01/26/17 0938  NA 134* 134* 134* 135 138  --   K 3.7 3.8 4.0 4.5 4.0  --   CL 105 104 105 106 107  --   CO2 24 24 24 25 27   --   GLUCOSE 244* 219* 264*  262* 179* 152*  --   BUN 22* 23* 23* 21* 22*  --   CREATININE 1.94* 2.12* 2.04* 1.86* 1.92*  --   CALCIUM 8.3* 8.4* 8.4*  8.4* 8.7*  --   MG 1.7 1.7 1.7 1.8 1.8 1.6*  PHOS 1.7* 1.4* 2.0* 3.3 2.4*  --     Liver Function Tests:  Recent Labs Lab 01/20/2017 1027 01/20/2017 1946  01/25/17 0458 01/25/17 1119 01/25/17 1415 01/25/17 1808 01/25/17 2235 01/26/17 0422  AST 31 29  --  39  --   --   --   --   --   ALT 17 17  --  19  --   --   --   --   --   ALKPHOS 102  --   --  79  --   --   --   --   --   BILITOT 1.1  --   --  0.8  --   --   --   --   --   PROT 6.5  --   --  5.7*  --   --   --   --   --   ALBUMIN 2.0* 1.8*  < > 2.1* 2.1* 2.6* 2.0* 2.2* 2.2*  < > = values in this interval not displayed.  Recent Labs Lab 01/24/2017 1027  LIPASE 46    Recent Labs Lab 01/22/17 1034  AMMONIA 56*    CBC:  Recent Labs Lab 01/24/2017 1027  01/23/17 0438 01/23/17 2229 01/24/17 0630 01/25/17 0458 01/26/17 0422  WBC 8.0  < > 12.8* 12.7* 11.7* 11.1* 12.7*  NEUTROABS 6.8*  --   --   --   --   --   --   HGB 13.1  < > 12.2* 11.5* 11.0* 9.8* 8.6*  HCT 39.2*  < > 36.3* 34.0* 33.0* 29.6* 26.8*  MCV 101.8*  < > 101.4* 101.2* 102.4* 102.4* 104.2*  PLT 122*  < > 94* 88* 80* 77* 61*  < > = values in this interval not displayed.  Cardiac Enzymes:  Recent Labs Lab 02/05/2017 1027 01/15/2017 1642 01/24/2017 1946 01/31/2017 2248 01/22/17 0239  TROPONINI 0.06* 0.03* 0.04* 0.04* 0.03*    BNP: Invalid input(s): POCBNP  CBG:  Recent Labs Lab 01/17/2017 1536 01/25/17 1318 01/25/17 1752 01/25/17 2119 01/26/17 0751  GLUCAP 98 151* >600* 170* 125*    Microbiology: Results for orders placed or performed during the hospital encounter of 01/13/2017  Blood Culture (routine x 2)     Status: None   Collection Time: 01/30/2017 10:27 AM  Result Value Ref Range Status   Specimen Description BLOOD LEFT ARM  Final   Special Requests BOTTLES DRAWN AEROBIC AND ANAEROBIC ANA2ML AER3ML  Final   Culture NO GROWTH 5 DAYS  Final   Report Status 01/26/2017 FINAL  Final  C difficile quick scan w PCR reflex     Status: None    Collection Time: 01/31/2017 11:28 AM  Result Value Ref Range Status   C Diff antigen NEGATIVE NEGATIVE Final   C Diff toxin NEGATIVE NEGATIVE Final   C Diff interpretation No C. difficile detected.  Final  Gastrointestinal Panel by PCR , Stool     Status: None   Collection Time: 02/08/2017 11:28 AM  Result Value Ref Range Status   Campylobacter species NOT DETECTED NOT DETECTED Final   Plesimonas shigelloides NOT DETECTED NOT DETECTED Final   Salmonella species NOT DETECTED NOT DETECTED Final   Yersinia enterocolitica NOT DETECTED NOT DETECTED Final   Vibrio species NOT DETECTED NOT DETECTED Final   Vibrio cholerae NOT DETECTED NOT DETECTED Final   Enteroaggregative E coli (EAEC) NOT DETECTED NOT DETECTED Final   Enteropathogenic E coli (EPEC) NOT DETECTED NOT DETECTED Final   Enterotoxigenic E coli (ETEC) NOT DETECTED NOT DETECTED Final   Shiga like toxin producing E coli (STEC) NOT DETECTED NOT DETECTED Final   Shigella/Enteroinvasive E coli (EIEC) NOT DETECTED NOT DETECTED Final   Cryptosporidium NOT DETECTED NOT DETECTED Final   Cyclospora cayetanensis NOT DETECTED NOT DETECTED Final   Entamoeba histolytica NOT DETECTED NOT DETECTED Final   Giardia lamblia NOT DETECTED NOT DETECTED Final   Adenovirus F40/41 NOT DETECTED NOT DETECTED Final   Astrovirus NOT DETECTED NOT DETECTED Final   Norovirus GI/GII NOT DETECTED NOT DETECTED Final   Rotavirus A NOT DETECTED NOT DETECTED Final   Sapovirus (I, II, IV, and V) NOT DETECTED NOT DETECTED Final  Blood Culture (routine x 2)     Status: None   Collection Time: 01/15/2017 11:28 AM  Result Value Ref Range Status   Specimen Description BLOOD RIGHT PICC  Final   Special Requests BOTTLES DRAWN AEROBIC AND ANAEROBIC ANA7ML AER9ML  Final   Culture NO GROWTH 5 DAYS  Final   Report Status 01/26/2017 FINAL  Final  Urine culture     Status: None   Collection Time: 01/23/2017 12:10 PM  Result Value Ref Range Status   Specimen Description URINE, RANDOM  Final   Special Requests NONE  Final   Culture   Final    NO GROWTH Performed at [facility], [address], [place], [place] [address]    Report Status 01/22/2017 FINAL  Final  MRSA PCR Screening     Status: None   Collection Time: 01/14/2017  6:07 PM  Result Value Ref Range Status   MRSA by PCR NEGATIVE NEGATIVE Final    Comment:        The GeneXpert MRSA Assay (FDA approved for NASAL specimens only), is one component of a comprehensive MRSA colonization surveillance program. It is not intended to diagnose MRSA infection nor to guide or monitor treatment for MRSA infections.     Coagulation Studies: No results for input(s): LABPROT, INR in the last 72 hours.  Urinalysis: No results for input(s): COLORURINE, LABSPEC, PHURINE, GLUCOSEU, HGBUR, BILIRUBINUR, KETONESUR, PROTEINUR, UROBILINOGEN, NITRITE, LEUKOCYTESUR in the last 72 hours.  Invalid input(s): APPERANCEUR    Imaging: Dg Chest Port 1 View  Result Date: 01/25/2017 CLINICAL DATA:  Respiratory failure, sepsis, CHF. EXAM: PORTABLE CHEST 1 VIEW COMPARISON:  Portable chest x-ray of January 22, 2017 FINDINGS: The lungs are adequately inflated. The retrocardiac region on the left is more dense today with total obscuration of the hemidiaphragm. There is increased density in the right infrahilar region but the hemidiaphragm remains visible. The cardiac silhouette is enlarged. The central pulmonary vascularity is prominent. The right-sided PICC line tip in the right sided large caliber internal jugular venous catheter tips project over the midportion of the SVC. IMPRESSION: Worsening of left lower lobe atelectasis or pneumonia. Probable small left pleural effusion. Electronically Signed   By: [name]  [place] M.D.   On: 01/25/2017 06:40     Medications:   . norepinephrine (LEVOPHED) Adult infusion 7 mcg/min (01/26/17 0954)  . octreotide  (SANDOSTATIN)    IV infusion    . pureflow 1,000 mL (01/25/17 0031)   . albumin  human  12.5 g Intravenous BID  . cefTRIAXone  2 g Intravenous Q24H  . feeding supplement (ENSURE ENLIVE)  237 mL Oral TID WC & HS  . feeding supplement (PRO-STAT SUGAR FREE 64)  30 mL Oral BID  . folic acid  1 mg Oral Daily  . hydrocerin   Topical BID  . hydrocortisone sod succinate (SOLU-CORTEF) inj  50 mg Intravenous Q12H  . insulin aspart  0-15 Units Subcutaneous TID WC  . insulin aspart  0-5 Units Subcutaneous QHS  . insulin glargine  10 Units Subcutaneous QHS  . ipratropium-albuterol  3 mL Nebulization Q6H  . mouth rinse  15 mL Mouth Rinse BID  . metronidazole  500 mg Intravenous Q8H  . midodrine  10 mg Oral TID WC  . octreotide  50 mcg Intravenous Once  . pantoprazole  40 mg Oral Q1200  . sodium chloride flush  10-40 mL Intracatheter Q12H  . sucralfate  1 g Oral TID WC & HS   sodium chloride, alteplase, alum & mag hydroxide-simeth, heparin, ondansetron (ZOFRAN) IV, sodium chloride flush  Assessment/ Plan:  Mr. [name] is a 56 y.o. white male with decubitus ulcer, hypertension, congestive heart failure, atrial fibrillation, depression who was admitted to [facility] from 1/14 to 2/2 for sepsis. He was discharged with metronidazole and ceftriaxone for a total of six weeks. Patient was admitted to [facility] on 02/01/2017 with sepsis.   1. Acute renal failure:  anuric. On vasopressors. Baseline creatinine of 0.86 on 01/11/17 Initiated on CRRT on 2/9. CVVHD  4K bath. Therapy rate 2000, BFR 300, UF 75 - renally dose all medications - monitor renal function, electrolytes, urine output and volume status.  - UF 1700 cc last 24 hrs -  Still on levophed - D/c CRRT within the next 24 hours. If CRRT system continues to clot d/c sooner and start HD tomorrow.  - will monitor UOP closely  2. Sepsis: hypotension. On vasopressors. Secondary to sacral decubitus ulcer infection.  - empiric antibiotics: ceftriaxone and metronidazole. - Continue supportive care.   3. Anasarca/Edema - 3rd spacing  from low albumin - iv albumin supplementation x 3 days     LOS: 5 Kerryann Allaire 2/14/201811:20 AM

## 2017-01-26 NOTE — Progress Notes (Signed)
Pharmacy CRRT Medication Dosing  POLO MCMARTIN is a 56 y.o. male admitted on 01/16/2017 with sepsis secondary to sacral decubitus ulcer. Patient discharged to [facility] on 2/2 on ceftriaxone and metronidazole. These have been continued in house. Pharmacy has been consulted  medication adjustment for CRRT.  Plan: No medications require renal adjustment at present. Will continue to follow.   Height: 6\' 4"  (193 cm) Weight: 297 lb 6.4 oz (134.9 kg) IBW/kg (Calculated) : 86.8  Temp (24hrs), Avg:97.8 F (36.6 C), Min:97.5 F (36.4 C), Max:98.2 F (36.8 C)   Recent Labs Lab 01/24/2017 1027 02/06/2017 1521  01/23/17 0438  01/23/17 2229  01/24/17 0630  01/25/17 0458  01/25/17 1415 01/25/17 1808 01/25/17 2235 01/26/17 0422 01/26/17 0642  WBC 8.0  --   < > 12.8*  --  12.7*  --  11.7*  --  11.1*  --   --   --   --  12.7*  --   CREATININE 6.14*  --   < > 3.22*  < >  --   < > 2.42*  < > 2.15*  < > 2.12* 2.04* 1.86* 1.92* 1.86*  LATICACIDVEN 2.2* 2.4*  --   --   --   --   --   --   --   --   --   --   --   --   --   --   < > = values in this interval not displayed.  Estimated Creatinine Clearance: 66.5 mL/min (by C-G formula based on SCr of 1.86 mg/dL (H)).    Allergies  Allergen Reactions  . Clindamycin/Lincomycin Diarrhea    Anti-infectives    Start     Dose/Rate Route Frequency Ordered Stop   01/22/17 1845  fluconazole (DIFLUCAN) IVPB 400 mg  Status:  Discontinued     400 mg 100 mL/hr over 120 Minutes Intravenous Every 24 hours 01/19/2017 1706 01/25/17 1550   01/22/17 1800  ceFEPIme (MAXIPIME) 1 GM / 50mL IVPB premix  Status:  Discontinued     1 g 100 mL/hr over 30 Minutes Intravenous Every 24 hours 01/26/2017 1502 01/26/2017 1513   01/22/17 1000  metroNIDAZOLE (FLAGYL) IVPB 500 mg     500 mg 100 mL/hr over 60 Minutes Intravenous Every 8 hours 01/22/17 0926     02/06/2017 1800  cefTRIAXone (ROCEPHIN) 2 g in dextrose 5 % 50 mL IVPB  Status:  Discontinued     2 g 100 mL/hr  over 30 Minutes Intravenous Every 24 hours 01/31/2017 1519 01/23/2017 1537   01/24/2017 1800  cefTRIAXone (ROCEPHIN) IVPB 2 g     2 g 100 mL/hr over 30 Minutes Intravenous Every 24 hours 01/22/2017 1538     02/05/2017 1800  fluconazole (DIFLUCAN) IVPB 800 mg     800 mg 200 mL/hr over 120 Minutes Intravenous  Once 01/13/2017 1706 01/22/2017 2335   01/20/2017 1600  metroNIDAZOLE (FLAGYL) tablet 500 mg  Status:  Discontinued     500 mg Oral 3 times daily 01/27/2017 1515 01/22/17 0925   01/29/2017 1515  cefTRIAXone (ROCEPHIN) 2 g in dextrose 5 % 50 mL IVPB  Status:  Discontinued     2 g 100 mL/hr over 30 Minutes Intravenous Every 24 hours 01/31/2017 1513 01/15/2017 1519   01/31/2017 1500  ceFEPIme (MAXIPIME) 1 GM / 50mL IVPB premix  Status:  Discontinued     1 g 100 mL/hr over 30 Minutes Intravenous Every 24 hours 02/01/2017 1410 01/15/2017 1502   01/27/2017 1115  piperacillin-tazobactam (ZOSYN) IVPB 3.375 g     3.375 g 100 mL/hr over 30 Minutes Intravenous  Once 01/26/2017 1113 01/19/2017 1256   01/13/2017 1115  vancomycin (VANCOCIN) IVPB 1000 mg/200 mL premix     1,000 mg 200 mL/hr over 60 Minutes Intravenous  Once 01/28/2017 1113 01/14/2017 1333     Recent Results (from the past 240 hour(s))  Blood Culture (routine x 2)     Status: None   Collection Time: 01/29/2017 10:27 AM  Result Value Ref Range Status   Specimen Description BLOOD LEFT ARM  Final   Special Requests BOTTLES DRAWN AEROBIC AND ANAEROBIC ANA2ML AER3ML  Final   Culture NO GROWTH 5 DAYS  Final   Report Status 01/26/2017 FINAL  Final  C difficile quick scan w PCR reflex     Status: None   Collection Time: 02/02/2017 11:28 AM  Result Value Ref Range Status   C Diff antigen NEGATIVE NEGATIVE Final   C Diff toxin NEGATIVE NEGATIVE Final   C Diff interpretation No C. difficile detected.  Final  Gastrointestinal Panel by PCR , Stool     Status: None   Collection Time: 02/01/2017 11:28 AM  Result Value Ref Range Status   Campylobacter species NOT DETECTED NOT DETECTED  Final   Plesimonas shigelloides NOT DETECTED NOT DETECTED Final   Salmonella species NOT DETECTED NOT DETECTED Final   Yersinia enterocolitica NOT DETECTED NOT DETECTED Final   Vibrio species NOT DETECTED NOT DETECTED Final   Vibrio cholerae NOT DETECTED NOT DETECTED Final   Enteroaggregative E coli (EAEC) NOT DETECTED NOT DETECTED Final   Enteropathogenic E coli (EPEC) NOT DETECTED NOT DETECTED Final   Enterotoxigenic E coli (ETEC) NOT DETECTED NOT DETECTED Final   Shiga like toxin producing E coli (STEC) NOT DETECTED NOT DETECTED Final   Shigella/Enteroinvasive E coli (EIEC) NOT DETECTED NOT DETECTED Final   Cryptosporidium NOT DETECTED NOT DETECTED Final   Cyclospora cayetanensis NOT DETECTED NOT DETECTED Final   Entamoeba histolytica NOT DETECTED NOT DETECTED Final   Giardia lamblia NOT DETECTED NOT DETECTED Final   Adenovirus F40/41 NOT DETECTED NOT DETECTED Final   Astrovirus NOT DETECTED NOT DETECTED Final   Norovirus GI/GII NOT DETECTED NOT DETECTED Final   Rotavirus A NOT DETECTED NOT DETECTED Final   Sapovirus (I, II, IV, and V) NOT DETECTED NOT DETECTED Final  Blood Culture (routine x 2)     Status: None   Collection Time: 01/25/2017 11:28 AM  Result Value Ref Range Status   Specimen Description BLOOD RIGHT PICC  Final   Special Requests BOTTLES DRAWN AEROBIC AND ANAEROBIC ANA7ML AER9ML  Final   Culture NO GROWTH 5 DAYS  Final   Report Status 01/26/2017 FINAL  Final  Urine culture     Status: None   Collection Time: 02/07/2017 12:10 PM  Result Value Ref Range Status   Specimen Description URINE, RANDOM  Final   Special Requests NONE  Final   Culture   Final    NO GROWTH Performed at [facility], [address], [place], [place] [address]    Report Status 01/22/2017 FINAL  Final  MRSA PCR Screening     Status: None   Collection Time: 01/20/2017  6:07 PM  Result Value Ref Range Status   MRSA by PCR NEGATIVE NEGATIVE Final    Comment:        The GeneXpert MRSA Assay  (FDA approved for NASAL specimens only), is one component of a comprehensive MRSA colonization  surveillance program. It is not intended to diagnose MRSA infection nor to guide or monitor treatment for MRSA infections.      Pharmacy will continue to monitor and adjust per consult.   Luisa HartChristy, Simranjit Thayer D 01/26/2017 12:16 PM

## 2017-01-26 NOTE — Progress Notes (Signed)
Much more awake and alert. Oriented. No distress. Vasopressor requirements cont to improve. RN reports black, melenotic stool  Vitals:   01/26/17 0600 01/26/17 0700 01/26/17 0800 01/26/17 0900  BP: (!) 81/65 (!) 69/52 98/69 (!) 86/68  Pulse: 78 70 68 97  Resp: 10 10 (!) 9 14  Temp:   97.5 F (36.4 C)   TempSrc:   Oral   SpO2: 96% 95% 94% 94%  Weight:      Height:       Chronically ill appearing, NAD HEENT WNL Clear anteriorly RRR s M NABS, soft Ext warm, Severe LE edema with chronic stasis changes  BMP Latest Ref Rng & Units 01/26/2017 01/26/2017 01/25/2017  Glucose 65 - 99 mg/dL 409(W216(H) 119(J152(H) 478(G179(H)  BUN 6 - 20 mg/dL 20 95(A22(H) 21(H21(H)  Creatinine 0.61 - 1.24 mg/dL 0.86(V1.86(H) 7.84(O1.92(H) 9.62(X1.86(H)  Sodium 135 - 145 mmol/L 135 138 135  Potassium 3.5 - 5.1 mmol/L 3.8 4.0 4.5  Chloride 101 - 111 mmol/L 106 107 106  CO2 22 - 32 mmol/L 25 27 25   Calcium 8.9 - 10.3 mg/dL 5.2(W8.6(L) 4.1(L8.7(L) 2.4(M8.4(L)   CBC Latest Ref Rng & Units 01/26/2017 01/25/2017 01/24/2017  WBC 3.8 - 10.6 K/uL 12.7(H) 11.1(H) 11.7(H)  Hemoglobin 13.0 - 18.0 g/dL 0.1(U8.6(L) 2.7(O9.8(L) 11.0(L)  Hematocrit 40.0 - 52.0 % 26.8(L) 29.6(L) 33.0(L)  Platelets 150 - 440 K/uL 61(L) 77(L) 80(L)   CXR: probable bilateral effusions  IMPRESSION: 1) Hypoxic respiratory failure, improving 2) Probable B pleural effusion - likely due to general volume overload 3) Wheezing, resolved 4) Shock - likely septic in origin - vasopressor requirements improving 5) Severe sepsis due to sacral pressure ulcer 6)  AKI, anuric. On CRRT - will likely need long term HD 7) Alcoholic cirrhosis 8) Worsening anemia - suspect slow GIB. Underwent endoscopy last month > portal gastropathy.  9) Thrombocytopenia 10) Acute encephalopathy - resolved 11) Chronic, severe debilitation 12) DNR/DNI in event of cardiopulmonary arrest  PLAN/REC: 1) Continue supplemental O2 2) Cont scheduled bronchodilators 3) Cont midodrine - Cont hydrocortisone - dose adjusted 02/13 4) Wean  norepinephrine to off for MAP > 60 mmHg 5) CRRT per Renal Service 6) Abx per ID service 7) GI consult 02/14. Octreotide gtt initiated  Billy Fischeravid Kasim Mccorkle, MD PCCM service Mobile [phone] Pager [phone] 01/26/2017

## 2017-01-27 LAB — BASIC METABOLIC PANEL
ANION GAP: 4 — AB (ref 5–15)
BUN: 16 mg/dL (ref 6–20)
CO2: 27 mmol/L (ref 22–32)
Calcium: 8.6 mg/dL — ABNORMAL LOW (ref 8.9–10.3)
Chloride: 106 mmol/L (ref 101–111)
Creatinine, Ser: 1.73 mg/dL — ABNORMAL HIGH (ref 0.61–1.24)
GFR, EST AFRICAN AMERICAN: 49 mL/min — AB (ref >60)
GFR, EST NON AFRICAN AMERICAN: 42 mL/min — AB (ref >60)
Glucose, Bld: 171 mg/dL — ABNORMAL HIGH (ref 65–99)
POTASSIUM: 4 mmol/L (ref 3.5–5.1)
SODIUM: 137 mmol/L (ref 135–145)

## 2017-01-27 LAB — RENAL FUNCTION PANEL
ALBUMIN: 2.4 g/dL — AB (ref 3.5–5.0)
Anion gap: 11 (ref 5–15)
BUN: 18 mg/dL (ref 6–20)
CALCIUM: 8.3 mg/dL — AB (ref 8.9–10.3)
CO2: 24 mmol/L (ref 22–32)
CREATININE: 1.68 mg/dL — AB (ref 0.61–1.24)
Chloride: 100 mmol/L — ABNORMAL LOW (ref 101–111)
GFR calc non Af Amer: 44 mL/min — ABNORMAL LOW (ref >60)
GFR, EST AFRICAN AMERICAN: 51 mL/min — AB (ref >60)
Glucose, Bld: 340 mg/dL — ABNORMAL HIGH (ref 65–99)
Phosphorus: 11.8 mg/dL — ABNORMAL HIGH (ref 2.5–4.6)
Potassium: 4 mmol/L (ref 3.5–5.1)
SODIUM: 135 mmol/L (ref 135–145)

## 2017-01-27 LAB — CBC
HCT: 24.6 % — ABNORMAL LOW (ref 40.0–52.0)
Hemoglobin: 7.9 g/dL — ABNORMAL LOW (ref 13.0–18.0)
MCH: 33.9 pg (ref 26.0–34.0)
MCHC: 32.2 g/dL (ref 32.0–36.0)
MCV: 105.1 fL — ABNORMAL HIGH (ref 80.0–100.0)
PLATELETS: 50 10*3/uL — AB (ref 150–440)
RBC: 2.34 MIL/uL — AB (ref 4.40–5.90)
RDW: 17.8 % — ABNORMAL HIGH (ref 11.5–14.5)
WBC: 11.6 10*3/uL — AB (ref 3.8–10.6)

## 2017-01-27 LAB — GLUCOSE, CAPILLARY
GLUCOSE-CAPILLARY: 140 mg/dL — AB (ref 65–99)
Glucose-Capillary: 123 mg/dL — ABNORMAL HIGH (ref 65–99)
Glucose-Capillary: 116 mg/dL — ABNORMAL HIGH (ref 65–99)
Glucose-Capillary: 170 mg/dL — ABNORMAL HIGH (ref 65–99)

## 2017-01-27 MED ORDER — DEXTROSE 5 % IV SOLN
2.0000 g | INTRAVENOUS | Status: DC
  Administered 2017-01-27 – 2017-02-08 (×13): 2 g via INTRAVENOUS
  Filled 2017-01-27 (×16): qty 2

## 2017-01-27 MED ORDER — PANTOPRAZOLE SODIUM 40 MG PO TBEC
40.0000 mg | DELAYED_RELEASE_TABLET | Freq: Two times a day (BID) | ORAL | Status: DC
  Administered 2017-01-27 – 2017-02-08 (×22): 40 mg via ORAL
  Filled 2017-01-27 (×23): qty 1

## 2017-01-27 MED ORDER — MAGNESIUM SULFATE 2 GM/50ML IV SOLN
2.0000 g | Freq: Once | INTRAVENOUS | Status: AC
  Administered 2017-01-27: 2 g via INTRAVENOUS
  Filled 2017-01-27: qty 50

## 2017-01-27 MED ORDER — NEPRO/CARBSTEADY PO LIQD
237.0000 mL | Freq: Two times a day (BID) | ORAL | Status: DC
  Administered 2017-01-28: 237 mL via ORAL

## 2017-01-27 MED ORDER — ALBUMIN HUMAN 25 % IV SOLN
12.5000 g | Freq: Once | INTRAVENOUS | Status: AC
  Administered 2017-01-27: 12.5 g via INTRAVENOUS
  Filled 2017-01-27: qty 50

## 2017-01-27 MED ORDER — HYDROCORTISONE NA SUCCINATE PF 100 MG IJ SOLR
25.0000 mg | Freq: Two times a day (BID) | INTRAMUSCULAR | Status: DC
  Administered 2017-01-27 – 2017-01-30 (×6): 25 mg via INTRAVENOUS
  Filled 2017-01-27 (×6): qty 2

## 2017-01-27 MED ORDER — STERILE WATER FOR INJECTION IJ SOLN
INTRAMUSCULAR | Status: AC
  Administered 2017-01-27: 12:00:00
  Filled 2017-01-27: qty 10

## 2017-01-27 NOTE — Progress Notes (Signed)
[facility]  ROUNDING NOTE   Subjective:   remains on CRRT UOP 0 cc  Net UF 1507 cc with CRRT Currently UF 75 cc/hr BP is in 90's Off vasopressors  Sitting up in bed alert with no acute complaints. No nausea or vomiting. Diffuse extremity swelling.   Objective:  Vital signs in last 24 hours:  Temp:  [97.5 F (36.4 C)-98 F (36.7 C)] 97.5 F (36.4 C) (02/15 0800) Pulse Rate:  [69-125] 71 (02/15 0800) Resp:  [9-18] 18 (02/15 0900) BP: (69-160)/(28-119) 90/64 (02/15 0900) SpO2:  [91 %-100 %] 97 % (02/15 0832) FiO2 (%):  [24 %] 24 % (02/15 0832) Weight:  [134.4 kg (296 lb 4.8 oz)] 134.4 kg (296 lb 4.8 oz) (02/15 0515)  Weight change: -0.5 kg (-1 lb 1.6 oz) Filed Weights   01/25/17 0400 01/26/17 0400 01/27/17 0515  Weight: 131.5 kg (289 lb 14.5 oz) 134.9 kg (297 lb 6.4 oz) 134.4 kg (296 lb 4.8 oz)    Intake/Output: I/O last 3 completed shifts: In: 1144.5 [P.O.:240; I.V.:804.5; IV Piggyback:100] Out: 2275 [Urine:45; Other:2230]   Intake/Output this shift:  Total I/O In: 114.4 [I.V.:114.4] Out: 217 [Other:217]  Physical Exam: General: Critically ill , sitting up in bed  Head: Normocephalic, atraumatic. Moist oral mucosal membranes  Eyes: Anicteric,    Neck: Supple,   Lungs:  Clear to auscultation  Heart: Regular rate and rhythm  Abdomen:  Soft, nontender, obese, distended   Extremities:  2+ dependent peripheral edema. Anasarca  Neurologic: Alert and oriented  Skin: Scattered bruising  Access: Right IJ temp HD catheter 2/9    Basic Metabolic Panel:  Recent Labs Lab 01/26/17 0422 01/26/17 0642 01/26/17 0938 01/26/17 1302 01/26/17 1752 01/26/17 2245 01/27/17 0004 01/27/17 0600  NA 138 135  --  135 135  --  135 137  K 4.0 3.8  --  3.9 3.5  --  4.0 4.0  CL 107 106  --  107 108  --  100* 106  CO2 27 25  --  24 24  --  24 27  GLUCOSE 152* 216*  --  228* 194*  --  340* 171*  BUN 22* 20  --  20 19  --  18 16  CREATININE 1.92* 1.86*  --  1.86*  1.60*  --  1.68* 1.73*  CALCIUM 8.7* 8.6*  --  8.4* 7.6*  --  8.3* 8.6*  MG 1.8  --  1.6* 1.7 1.4* 1.4*  --   --   PHOS 2.4* 2.1*  --  2.3* 2.1*  --  11.8*  --     Liver Function Tests:  Recent Labs Lab 02-04-2017 1027 04-Feb-2017 1946  01/25/17 0458  01/26/17 0422 01/26/17 0642 01/26/17 1302 01/26/17 1752 01/27/17 0004  AST 31 29  --  39  --   --   --   --   --   --   ALT 17 17  --  19  --   --   --   --   --   --   ALKPHOS 102  --   --  79  --   --   --   --   --   --   BILITOT 1.1  --   --  0.8  --   --   --   --   --   --   PROT 6.5  --   --  5.7*  --   --   --   --   --   --  ALBUMIN 2.0* 1.8*  < > 2.1*  < > 2.2* 2.4* 2.8* 2.2* 2.4*  < > = values in this interval not displayed.  Recent Labs Lab 01/17/2017 1027  LIPASE 46    Recent Labs Lab 01/22/17 1034  AMMONIA 56*    CBC:  Recent Labs Lab 01/22/2017 1027  01/23/17 2229 01/24/17 0630 01/25/17 0458 01/26/17 0422 01/27/17 0600  WBC 8.0  < > 12.7* 11.7* 11.1* 12.7* 11.6*  NEUTROABS 6.8*  --   --   --   --   --   --   HGB 13.1  < > 11.5* 11.0* 9.8* 8.6* 7.9*  HCT 39.2*  < > 34.0* 33.0* 29.6* 26.8* 24.6*  MCV 101.8*  < > 101.2* 102.4* 102.4* 104.2* 105.1*  PLT 122*  < > 88* 80* 77* 61* 50*  < > = values in this interval not displayed.  Cardiac Enzymes:  Recent Labs Lab 01/25/2017 1027 01/15/2017 1642 01/17/2017 1946 01/24/2017 2248 01/22/17 0239  TROPONINI 0.06* 0.03* 0.04* 0.04* 0.03*    BNP: Invalid input(s): POCBNP  CBG:  Recent Labs Lab 01/26/17 0751 01/26/17 1310 01/26/17 1611 01/26/17 2136 01/27/17 0735  GLUCAP 125* 176* 193* 164* 123*    Microbiology: Results for orders placed or performed during the hospital encounter of 01/24/2017  Blood Culture (routine x 2)     Status: None   Collection Time: 01/31/2017 10:27 AM  Result Value Ref Range Status   Specimen Description BLOOD LEFT ARM  Final   Special Requests BOTTLES DRAWN AEROBIC AND ANAEROBIC ANA2ML AER3ML  Final   Culture NO GROWTH 5  DAYS  Final   Report Status 01/26/2017 FINAL  Final  C difficile quick scan w PCR reflex     Status: None   Collection Time: 01/27/2017 11:28 AM  Result Value Ref Range Status   C Diff antigen NEGATIVE NEGATIVE Final   C Diff toxin NEGATIVE NEGATIVE Final   C Diff interpretation No C. difficile detected.  Final  Gastrointestinal Panel by PCR , Stool     Status: None   Collection Time: 01/31/2017 11:28 AM  Result Value Ref Range Status   Campylobacter species NOT DETECTED NOT DETECTED Final   Plesimonas shigelloides NOT DETECTED NOT DETECTED Final   Salmonella species NOT DETECTED NOT DETECTED Final   Yersinia enterocolitica NOT DETECTED NOT DETECTED Final   Vibrio species NOT DETECTED NOT DETECTED Final   Vibrio cholerae NOT DETECTED NOT DETECTED Final   Enteroaggregative E coli (EAEC) NOT DETECTED NOT DETECTED Final   Enteropathogenic E coli (EPEC) NOT DETECTED NOT DETECTED Final   Enterotoxigenic E coli (ETEC) NOT DETECTED NOT DETECTED Final   Shiga like toxin producing E coli (STEC) NOT DETECTED NOT DETECTED Final   Shigella/Enteroinvasive E coli (EIEC) NOT DETECTED NOT DETECTED Final   Cryptosporidium NOT DETECTED NOT DETECTED Final   Cyclospora cayetanensis NOT DETECTED NOT DETECTED Final   Entamoeba histolytica NOT DETECTED NOT DETECTED Final   Giardia lamblia NOT DETECTED NOT DETECTED Final   Adenovirus F40/41 NOT DETECTED NOT DETECTED Final   Astrovirus NOT DETECTED NOT DETECTED Final   Norovirus GI/GII NOT DETECTED NOT DETECTED Final   Rotavirus A NOT DETECTED NOT DETECTED Final   Sapovirus (I, II, IV, and V) NOT DETECTED NOT DETECTED Final  Blood Culture (routine x 2)     Status: None   Collection Time: 02/02/2017 11:28 AM  Result Value Ref Range Status   Specimen Description BLOOD RIGHT PICC  Final   Special Requests  BOTTLES DRAWN AEROBIC AND ANAEROBIC ANA7ML AER9ML  Final   Culture NO GROWTH 5 DAYS  Final   Report Status 01/26/2017 FINAL  Final  Urine culture     Status:  None   Collection Time: 01/14/2017 12:10 PM  Result Value Ref Range Status   Specimen Description URINE, RANDOM  Final   Special Requests NONE  Final   Culture   Final    NO GROWTH Performed at [facility], [address] [place], [place] [address]    Report Status 01/22/2017 FINAL  Final  MRSA PCR Screening     Status: None   Collection Time: 01/15/2017  6:07 PM  Result Value Ref Range Status   MRSA by PCR NEGATIVE NEGATIVE Final    Comment:        The GeneXpert MRSA Assay (FDA approved for NASAL specimens only), is one component of a comprehensive MRSA colonization surveillance program. It is not intended to diagnose MRSA infection nor to guide or monitor treatment for MRSA infections.     Coagulation Studies:  Recent Labs  01/26/17 1302  LABPROT 21.0*  INR 1.79    Urinalysis: No results for input(s): COLORURINE, LABSPEC, PHURINE, GLUCOSEU, HGBUR, BILIRUBINUR, KETONESUR, PROTEINUR, UROBILINOGEN, NITRITE, LEUKOCYTESUR in the last 72 hours.  Invalid input(s): APPERANCEUR    Imaging: No results found.   Medications:   . norepinephrine (LEVOPHED) Adult infusion Stopped (01/27/17 0901)  . octreotide  (SANDOSTATIN)    IV infusion 50 mcg/hr (01/27/17 1000)  . pureflow 1,000 mL (01/25/17 0031)   . cefTRIAXone  2 g Intravenous Q24H  . feeding supplement (ENSURE ENLIVE)  237 mL Oral TID WC & HS  . feeding supplement (PRO-STAT SUGAR FREE 64)  30 mL Oral BID  . folic acid  1 mg Oral Daily  . hydrocerin   Topical BID  . hydrocortisone sod succinate (SOLU-CORTEF) inj  50 mg Intravenous Q12H  . insulin aspart  0-15 Units Subcutaneous TID WC  . insulin aspart  0-5 Units Subcutaneous QHS  . insulin glargine  10 Units Subcutaneous QHS  . ipratropium-albuterol  3 mL Nebulization Q6H  . mouth rinse  15 mL Mouth Rinse BID  . metronidazole  500 mg Intravenous Q8H  . midodrine  10 mg Oral TID WC  . pantoprazole (PROTONIX) IV  40 mg Intravenous Q12H  . phytonadione  10  mg Subcutaneous Daily  . sodium chloride flush  10-40 mL Intracatheter Q12H  . sucralfate  1 g Oral TID WC & HS   sodium chloride, alteplase, alum & mag hydroxide-simeth, heparin, ondansetron (ZOFRAN) IV, sodium chloride flush  Assessment/ Plan:  Mr. [name] is a 56 y.o. white male with decubitus ulcer, hypertension, congestive heart failure, atrial fibrillation, Alcoholic cirrhosis, depression who was admitted to [facility] from 1/14 to 2/2 for sepsis. He was discharged with metronidazole and ceftriaxone for a total of six weeks. Patient was admitted to [facility] on 02/08/2017 with sepsis.   1. Acute renal failure:  anuric. Off vasopressors. Baseline creatinine of 0.86 on 01/11/17 Initiated on CRRT on 2/9. CVVHD 4K bath. Therapy rate 2000, BFR 300, UF 75 - renally dose all medications - monitor renal function, electrolytes, urine output and volume status.  - UF 1500 cc last 24 hrs - Discontinue CRRT today. Will re-evaluate for need of HD on a daily basis. Possible treatment tomorrow.   - will monitor UOP closely  2. Sepsis: hypotension. Off vasopressors now. Secondary to sacral decubitus ulcer infection.  - empiric  antibiotics: ceftriaxone and metronidazole. - Continue supportive care.   3. Anasarca/Edema - 3rd spacing from low albumin, Alcoholic cirrhosis -Finished 3 day iv albumin supplementation yesterday. -Still edematous today. Will order IV albumin today.   4. Hyperphosphatemia - change ensure to NEPRO     LOS: 6 Saundra Gin 2/15/201810:38 AM

## 2017-01-27 NOTE — Progress Notes (Signed)
CRRT stopped at 1220. Access packed with Heparin per protocol. Pt in stable condition. Pt tolerated well. Will continue to assess.

## 2017-01-27 NOTE — Plan of Care (Signed)
Problem: Pain Managment: Goal: General experience of comfort will improve Outcome: Progressing Patient has had no complaints of pain, nausea or vomiting. Turning Q2 hr to offload pressure.   Problem: Skin Integrity: Goal: Risk for impaired skin integrity will decrease Outcome: Progressing Q2 hr turn, using pillows, and floating hills. Pressure ulcer dressing x2 today. Barrier cream used for moisture under buttocks. Boots on bilateral feet to decrease pressure.

## 2017-01-27 NOTE — Progress Notes (Signed)
Remains awake and alert. Oriented. No distress. Off vasopressors. No new complaints  Vitals:   01/27/17 0700 01/27/17 0800 01/27/17 0832 01/27/17 0900  BP: (!) 79/69 (!) 134/111  90/64  Pulse:  71    Resp: (!) 9 13  18   Temp:  97.5 F (36.4 C)    TempSrc:  Oral    SpO2:  98% 97%   Weight:      Height:      Room air  Chronically ill appearing, NAD, RASS 0 HEENT WNL Clear anteriorly RRR s M NABS, soft Ext warm, Symmetric LE edema with chronic stasis changes  BMP Latest Ref Rng & Units 01/27/2017 01/27/2017 01/26/2017  Glucose 65 - 99 mg/dL 409(W171(H) 119(J340(H) 478(G194(H)  BUN 6 - 20 mg/dL 16 18 19   Creatinine 0.61 - 1.24 mg/dL 9.56(O1.73(H) 1.30(Q1.68(H) 6.57(Q1.60(H)  Sodium 135 - 145 mmol/L 137 135 135  Potassium 3.5 - 5.1 mmol/L 4.0 4.0 3.5  Chloride 101 - 111 mmol/L 106 100(L) 108  CO2 22 - 32 mmol/L 27 24 24   Calcium 8.9 - 10.3 mg/dL 4.6(N8.6(L) 8.3(L) 7.6(L)   CBC Latest Ref Rng & Units 01/27/2017 01/26/2017 01/25/2017  WBC 3.8 - 10.6 K/uL 11.6(H) 12.7(H) 11.1(H)  Hemoglobin 13.0 - 18.0 g/dL 7.9(L) 8.6(L) 9.8(L)  Hematocrit 40.0 - 52.0 % 24.6(L) 26.8(L) 29.6(L)  Platelets 150 - 440 K/uL 50(L) 61(L) 77(L)   CXR: No new film  IMPRESSION: 1) Hypoxic respiratory failure, resolved 2) Probable B pleural effusion - likely due to general volume overload 3) Wheezing, resolved 4) Shock, suspected septic in origin - appears to be resolved 5) Severe sepsis due to sacral pressure ulcer 6)  AKI, anuric. On CRRT still 7) Alcoholic cirrhosis with portal gastropathy 8) Worsening anemia - suspect slow GIB 9) Chronic thrombocytopenia, now worsening 10) Acute encephalopathy, resolved 11) Chronic, severe debilitation 12) DNR/DNI in event of cardiopulmonary arrest. Full aggressive medical care short of that  PLAN/REC: 1) Continue supplemental O2 2) Cont scheduled bronchodilators 3) Cont midodrine -  4) Wean hydrocortisone to off 5) DC CRRT per Renal Service. Trial of intermittent HD 02/16. If tolerates that, he can  leave ICU to med-surg floor and to Hospitalist Service 6) Abx per ID service 7) GI consult 02/14. Octreotide gtt initiated. Cont PPI  8) monitor CBC and transfuse for Hgb < 7.0.   Billy Fischeravid Simonds, MD PCCM service Mobile [phone] Pager [phone] 01/27/2017

## 2017-01-27 NOTE — Progress Notes (Signed)
Dressing change due to sacrum.  Pt declined to have dressing changed.  Stated he "hadn't done anything".

## 2017-01-27 NOTE — Progress Notes (Signed)
Pt is in stable condition at this time. No complaints of pain on my shift. Pressure ulcer dressing changed x 2 per WOC nurse. Pt remains alert and oriented. [facility] to [facility], Charity fundraiserN. Full assessment in EPIC.

## 2017-01-28 DIAGNOSIS — E43 Unspecified severe protein-calorie malnutrition: Secondary | ICD-10-CM | POA: Insufficient documentation

## 2017-01-28 LAB — CBC
HCT: 26 % — ABNORMAL LOW (ref 40.0–52.0)
Hemoglobin: 8.7 g/dL — ABNORMAL LOW (ref 13.0–18.0)
MCH: 35 pg — ABNORMAL HIGH (ref 26.0–34.0)
MCHC: 33.4 g/dL (ref 32.0–36.0)
MCV: 104.7 fL — ABNORMAL HIGH (ref 80.0–100.0)
PLATELETS: 46 10*3/uL — AB (ref 150–440)
RBC: 2.48 MIL/uL — AB (ref 4.40–5.90)
RDW: 18 % — AB (ref 11.5–14.5)
WBC: 11.3 10*3/uL — AB (ref 3.8–10.6)

## 2017-01-28 LAB — RENAL FUNCTION PANEL
ALBUMIN: 2.4 g/dL — AB (ref 3.5–5.0)
Anion gap: 3 — ABNORMAL LOW (ref 5–15)
BUN: 22 mg/dL — AB (ref 6–20)
CO2: 27 mmol/L (ref 22–32)
CREATININE: 2.34 mg/dL — AB (ref 0.61–1.24)
Calcium: 8.9 mg/dL (ref 8.9–10.3)
Chloride: 108 mmol/L (ref 101–111)
GFR calc non Af Amer: 29 mL/min — ABNORMAL LOW (ref >60)
GFR, EST AFRICAN AMERICAN: 34 mL/min — AB (ref >60)
Glucose, Bld: 138 mg/dL — ABNORMAL HIGH (ref 65–99)
PHOSPHORUS: 3.4 mg/dL (ref 2.5–4.6)
POTASSIUM: 4.2 mmol/L (ref 3.5–5.1)
Sodium: 138 mmol/L (ref 135–145)

## 2017-01-28 LAB — GLUCOSE, CAPILLARY
GLUCOSE-CAPILLARY: 114 mg/dL — AB (ref 65–99)
GLUCOSE-CAPILLARY: 121 mg/dL — AB (ref 65–99)
Glucose-Capillary: 142 mg/dL — ABNORMAL HIGH (ref 65–99)
Glucose-Capillary: 164 mg/dL — ABNORMAL HIGH (ref 65–99)
Glucose-Capillary: 182 mg/dL — ABNORMAL HIGH (ref 65–99)

## 2017-01-28 MED ORDER — ALBUMIN HUMAN 25 % IV SOLN
25.0000 g | Freq: Once | INTRAVENOUS | Status: AC
  Administered 2017-01-28: 25 g via INTRAVENOUS
  Filled 2017-01-28 (×2): qty 100

## 2017-01-28 MED ORDER — METRONIDAZOLE 500 MG PO TABS
500.0000 mg | ORAL_TABLET | Freq: Three times a day (TID) | ORAL | Status: DC
  Administered 2017-01-28 – 2017-02-08 (×35): 500 mg via ORAL
  Filled 2017-01-28 (×35): qty 1

## 2017-01-28 MED ORDER — IPRATROPIUM-ALBUTEROL 0.5-2.5 (3) MG/3ML IN SOLN
3.0000 mL | Freq: Four times a day (QID) | RESPIRATORY_TRACT | Status: DC | PRN
  Administered 2017-01-30 – 2017-02-03 (×2): 3 mL via RESPIRATORY_TRACT
  Filled 2017-01-28 (×2): qty 3

## 2017-01-28 MED ORDER — PRO-STAT SUGAR FREE PO LIQD
30.0000 mL | Freq: Two times a day (BID) | ORAL | Status: DC
  Administered 2017-01-29 – 2017-02-07 (×10): 30 mL via ORAL

## 2017-01-28 MED ORDER — NEPRO/CARBSTEADY PO LIQD
237.0000 mL | Freq: Three times a day (TID) | ORAL | Status: DC
  Administered 2017-01-29 – 2017-01-31 (×8): 237 mL via ORAL

## 2017-01-28 NOTE — Progress Notes (Signed)
PT Cancellation Note  Patient Details Name: [name] MRN: [id number] DOB: Sep 03, 1961   Cancelled Treatment:    Reason Eval/Treat Not Completed: Patient declined, no reason specified. Patient with visitor in the room, declines mobility stating he's not ready for mobility, would prefer for therapy to try again tomorrow.   Alva GarnetPatrick McNamara PT, DPT, CSCS    01/28/2017, 12:42 PM

## 2017-01-28 NOTE — Progress Notes (Signed)
HD COMPLETED  

## 2017-01-28 NOTE — Progress Notes (Signed)
HD STARTED  

## 2017-01-28 NOTE — Progress Notes (Signed)
PRE DIALYSIS ASSESSMENT 

## 2017-01-28 NOTE — Care Management (Signed)
Referral to [facility] for LTAC as it was denied last visit and patient became frustrated with authorization waiting period. Cicero DuckErika with [facility] will meet with patient again today. RNCM to continue to follow.

## 2017-01-28 NOTE — Progress Notes (Signed)
Spoke with Dr. Thedore MinsSingh on the phone about patient's stable vital signs and assessment post-dialysis. MD gave order that patient could transfer to any medical surgical floor with off unit cardiac monitoring.

## 2017-01-28 NOTE — Progress Notes (Signed)
Nutrition Follow-up  DOCUMENTATION CODES:   Severe malnutrition in context of acute illness/injury, Obesity unspecified  INTERVENTION:  -Increase Nepro to TID -Continue Prostat BID  NUTRITION DIAGNOSIS:   Malnutrition related to acute illness as evidenced by energy intake < or equal to 50% for > or equal to 5 days, severe fluid accumulation, mild depletion of body fat, percent weight loss, mild depletion of muscle mass.  GOAL:   Patient will meet greater than or equal to 90% of their needs  MONITOR:   PO intake, Supplement acceptance, Labs, Weight trends  REASON FOR ASSESSMENT:   Rounds    ASSESSMENT:   56 yo male admitted with septic shock secondary to infected decubitus ulcer, acute respiratory failure due to pulmonary congestion, ARF with oligruia  Pt off CRRT, off vasopressors, 1st intermittent HD today. UOP remains poor Pt appetite improving, recorded po intake 88 % of meals on average Pt drinking Nepro, taking Prostat supplements. Nepro ordered by MD BID due to electrolyte imbalances 2/15 and EnsureEnlive QID. Pt reports he likes Ensure and Nepro the same, just prefers vanilla or chocolate Pt does not like Magic Cup and was discontinued from meal trays.  Labs: potassium and phosphorus wdl Meds: ss novolog, lantus  Diet Order:  Diet regular Room service appropriate? Yes; Fluid consistency: Thin; Fluid restriction: 1200 mL Fluid  Skin:  Wound (see comment) (unstageable, full thickeness tissue loss on buttock)  Last BM:  01/28/17  Height:   Ht Readings from Last 1 Encounters:  10-26-17 6\' 4"  (1.93 m)    Weight:   Wt Readings from Last 1 Encounters:  01/28/17 (!) 304 lb 3.8 oz (138 kg)    Ideal Body Weight:  91.8 kg  BMI:  Body mass index is 37.03 kg/m.  Estimated Nutritional Needs:   Kcal:  2500-2800 kcals  Protein:  138-180 g  Fluid:  1000 mL plus UOP or per MD  EDUCATION NEEDS:   No education needs identified at this time  Romelle StarcherCate Shanekia Latella MS,  RD, LDN [phone] Pager  [phone] Weekend/On-Call Pager

## 2017-01-28 NOTE — Progress Notes (Signed)
Pulmonary / Critical Note   Remains awake and alert. Oriented. No distress. Off vasopressors. No new complaints. Hemodialysis has been DC'd.  Vitals:   01/28/17 0400 01/28/17 0406 01/28/17 0500 01/28/17 0600  BP: (!) 89/75  116/83 99/77  Pulse: 69  97 86  Resp: 14  13 14   Temp:   97.6 F (36.4 C)   TempSrc:   Oral   SpO2: 91%  90% 92%  Weight:  135 kg (297 lb 11.2 oz)    Height:      Room air  Chronically ill appearing, NAD, RASS 0 HEENT WNL Clear anteriorly RRR s M NABS, soft Ext warm, Symmetric LE edema with chronic stasis changes  BMP Latest Ref Rng & Units 01/28/2017 01/27/2017 01/27/2017  Glucose 65 - 99 mg/dL 454(U138(H) 981(X171(H) 914(N340(H)  BUN 6 - 20 mg/dL 82(N22(H) 16 18  Creatinine 0.61 - 1.24 mg/dL 5.62(Z2.34(H) 3.08(M1.73(H) 5.78(I1.68(H)  Sodium 135 - 145 mmol/L 138 137 135  Potassium 3.5 - 5.1 mmol/L 4.2 4.0 4.0  Chloride 101 - 111 mmol/L 108 106 100(L)  CO2 22 - 32 mmol/L 27 27 24   Calcium 8.9 - 10.3 mg/dL 8.9 6.9(G8.6(L) 8.3(L)   CBC Latest Ref Rng & Units 01/28/2017 01/27/2017 01/26/2017  WBC 3.8 - 10.6 K/uL 11.3(H) 11.6(H) 12.7(H)  Hemoglobin 13.0 - 18.0 g/dL 2.9(B8.7(L) 7.9(L) 8.6(L)  Hematocrit 40.0 - 52.0 % 26.0(L) 24.6(L) 26.8(L)  Platelets 150 - 440 K/uL 46(L) 50(L) 61(L)   CXR: No new film  IMPRESSION: 1) Hypoxic respiratory failure, resolved 2) Probable B pleural effusion - likely due to general volume overload 3) Wheezing, resolved 4) Shock Stable hemodynamics off of pressors 5) Severe sepsis due to sacral pressure ulcer 6)  [facility] hemodialysis has been DC'd, positive urine output 7) Alcoholic cirrhosis with portal gastropathy 8) anemia, hemoglobin 8.7, no clear evidence of active bleeding at this time 9) Chronic thrombocytopenia, likely decreased from yesterday to 46 10) Acute encephalopathy, resolved 11) Chronic, severe debilitation 12) DNR/DNI in event of cardiopulmonary arrest. Full aggressive medical care short of that  PLAN/REC: 1) Continue supplemental O2 2) Cont scheduled  bronchodilators 3) Cont midodrine -  4) Wean hydrocortisone to off 5) DC CRRT per Renal Service. Trial of intermittent HD 02/16. If tolerates that, he can leave ICU to med-surg floor and to Hospitalist Service 6) Abx per ID service 7) GI consult 02/14. Octreotide gtt initiated. Cont PPI  8) monitor CBC and transfuse for Hgb < 7.0.    Tora KindredJohn Merrily Tegeler, DO PCCM Service 01/28/2017

## 2017-01-28 NOTE — Progress Notes (Signed)
POST DIALYSIS ASSESSMENT 

## 2017-01-28 NOTE — Progress Notes (Signed)
HD STARTED AT 1435HRS

## 2017-01-28 NOTE — Progress Notes (Signed)
[facility]  ROUNDING NOTE   Subjective:   CRRT discontinued on 2/15 Awake, alert, UOP still low Able to eat some. No nausea/Comiting reported   Objective:  Vital signs in last 24 hours:  Temp:  [96.8 F (36 C)-98.7 F (37.1 C)] 97.6 F (36.4 C) (02/16 0500) Pulse Rate:  [69-111] 86 (02/16 0600) Resp:  [9-27] 14 (02/16 0600) BP: (67-116)/(52-92) 99/77 (02/16 0600) SpO2:  [90 %-100 %] 92 % (02/16 0600) FiO2 (%):  [21 %] 21 % (02/15 1341) Weight:  [135 kg (297 lb 11.2 oz)] 135 kg (297 lb 11.2 oz) (02/16 0406)  Weight change: 0.636 kg (1 lb 6.4 oz) Filed Weights   01/26/17 0400 01/27/17 0515 01/28/17 0406  Weight: 134.9 kg (297 lb 6.4 oz) 134.4 kg (296 lb 4.8 oz) 135 kg (297 lb 11.2 oz)    Intake/Output: I/O last 3 completed shifts: In: 2844.2 [I.V.:2394.2; IV Piggyback:450] Out: 1276 [Urine:50; Other:1225; Stool:1]   Intake/Output this shift:  No intake/output data recorded.  Physical Exam: General: Critically ill , sitting up in bed  Head: Normocephalic, atraumatic. Moist oral mucosal membranes  Eyes: Anicteric,    Neck: Supple,   Lungs:  Clear to auscultation  Heart: Regular rate and rhythm  Abdomen:  Soft, nontender, obese, distended   Extremities:  2+ dependent peripheral edema. Anasarca  Neurologic: Alert and oriented  Skin: Scattered bruising  Access: Right IJ temp HD catheter 2/9    Basic Metabolic Panel:  Recent Labs Lab 01/26/17 0422 01/26/17 0642 01/26/17 0938 01/26/17 1302 01/26/17 1752 01/26/17 2245 01/27/17 0004 01/27/17 0600 01/28/17 0515  NA 138 135  --  135 135  --  135 137 138  K 4.0 3.8  --  3.9 3.5  --  4.0 4.0 4.2  CL 107 106  --  107 108  --  100* 106 108  CO2 27 25  --  24 24  --  24 27 27   GLUCOSE 152* 216*  --  228* 194*  --  340* 171* 138*  BUN 22* 20  --  20 19  --  18 16 22*  CREATININE 1.92* 1.86*  --  1.86* 1.60*  --  1.68* 1.73* 2.34*  CALCIUM 8.7* 8.6*  --  8.4* 7.6*  --  8.3* 8.6* 8.9  MG 1.8  --   1.6* 1.7 1.4* 1.4*  --   --   --   PHOS 2.4* 2.1*  --  2.3* 2.1*  --  11.8*  --  3.4    Liver Function Tests:  Recent Labs Lab 01/28/2017 1946  01/25/17 0458  01/26/17 0642 01/26/17 1302 01/26/17 1752 01/27/17 0004 01/28/17 0515  AST 29  --  39  --   --   --   --   --   --   ALT 17  --  19  --   --   --   --   --   --   ALKPHOS  --   --  79  --   --   --   --   --   --   BILITOT  --   --  0.8  --   --   --   --   --   --   PROT  --   --  5.7*  --   --   --   --   --   --   ALBUMIN 1.8*  < > 2.1*  < > 2.4* 2.8*  2.2* 2.4* 2.4*  < > = values in this interval not displayed. No results for input(s): LIPASE, AMYLASE in the last 168 hours.  Recent Labs Lab 01/22/17 1034  AMMONIA 56*    CBC:  Recent Labs Lab 01/24/17 0630 01/25/17 0458 01/26/17 0422 01/27/17 0600 01/28/17 0553  WBC 11.7* 11.1* 12.7* 11.6* 11.3*  HGB 11.0* 9.8* 8.6* 7.9* 8.7*  HCT 33.0* 29.6* 26.8* 24.6* 26.0*  MCV 102.4* 102.4* 104.2* 105.1* 104.7*  PLT 80* 77* 61* 50* 46*    Cardiac Enzymes:  Recent Labs Lab 02/01/2017 1642 02/03/2017 1946 01/16/2017 2248 01/22/17 0239  TROPONINI 0.03* 0.04* 0.04* 0.03*    BNP: Invalid input(s): POCBNP  CBG:  Recent Labs Lab 01/27/17 0735 01/27/17 1118 01/27/17 1620 01/27/17 2222 01/28/17 0706  GLUCAP 123* 116* 140* 170* 142*    Microbiology: Results for orders placed or performed during the hospital encounter of 01/19/2017  Blood Culture (routine x 2)     Status: None   Collection Time: 01/25/2017 10:27 AM  Result Value Ref Range Status   Specimen Description BLOOD LEFT ARM  Final   Special Requests BOTTLES DRAWN AEROBIC AND ANAEROBIC ANA2ML AER3ML  Final   Culture NO GROWTH 5 DAYS  Final   Report Status 01/26/2017 FINAL  Final  C difficile quick scan w PCR reflex     Status: None   Collection Time: 01/16/2017 11:28 AM  Result Value Ref Range Status   C Diff antigen NEGATIVE NEGATIVE Final   C Diff toxin NEGATIVE NEGATIVE Final   C Diff interpretation  No C. difficile detected.  Final  Gastrointestinal Panel by PCR , Stool     Status: None   Collection Time: 01/28/2017 11:28 AM  Result Value Ref Range Status   Campylobacter species NOT DETECTED NOT DETECTED Final   Plesimonas shigelloides NOT DETECTED NOT DETECTED Final   Salmonella species NOT DETECTED NOT DETECTED Final   Yersinia enterocolitica NOT DETECTED NOT DETECTED Final   Vibrio species NOT DETECTED NOT DETECTED Final   Vibrio cholerae NOT DETECTED NOT DETECTED Final   Enteroaggregative E coli (EAEC) NOT DETECTED NOT DETECTED Final   Enteropathogenic E coli (EPEC) NOT DETECTED NOT DETECTED Final   Enterotoxigenic E coli (ETEC) NOT DETECTED NOT DETECTED Final   Shiga like toxin producing E coli (STEC) NOT DETECTED NOT DETECTED Final   Shigella/Enteroinvasive E coli (EIEC) NOT DETECTED NOT DETECTED Final   Cryptosporidium NOT DETECTED NOT DETECTED Final   Cyclospora cayetanensis NOT DETECTED NOT DETECTED Final   Entamoeba histolytica NOT DETECTED NOT DETECTED Final   Giardia lamblia NOT DETECTED NOT DETECTED Final   Adenovirus F40/41 NOT DETECTED NOT DETECTED Final   Astrovirus NOT DETECTED NOT DETECTED Final   Norovirus GI/GII NOT DETECTED NOT DETECTED Final   Rotavirus A NOT DETECTED NOT DETECTED Final   Sapovirus (I, II, IV, and V) NOT DETECTED NOT DETECTED Final  Blood Culture (routine x 2)     Status: None   Collection Time: 02/08/2017 11:28 AM  Result Value Ref Range Status   Specimen Description BLOOD RIGHT PICC  Final   Special Requests BOTTLES DRAWN AEROBIC AND ANAEROBIC ANA7ML AER9ML  Final   Culture NO GROWTH 5 DAYS  Final   Report Status 01/26/2017 FINAL  Final  Urine culture     Status: None   Collection Time: 01/27/2017 12:10 PM  Result Value Ref Range Status   Specimen Description URINE, RANDOM  Final   Special Requests NONE  Final   Culture  Final    NO GROWTH Performed at [facility], [address] [place], [place] [address]    Report Status  01/22/2017 FINAL  Final  MRSA PCR Screening     Status: None   Collection Time: 01/24/2017  6:07 PM  Result Value Ref Range Status   MRSA by PCR NEGATIVE NEGATIVE Final    Comment:        The GeneXpert MRSA Assay (FDA approved for NASAL specimens only), is one component of a comprehensive MRSA colonization surveillance program. It is not intended to diagnose MRSA infection nor to guide or monitor treatment for MRSA infections.     Coagulation Studies:  Recent Labs  01/26/17 1302  LABPROT 21.0*  INR 1.79    Urinalysis: No results for input(s): COLORURINE, LABSPEC, PHURINE, GLUCOSEU, HGBUR, BILIRUBINUR, KETONESUR, PROTEINUR, UROBILINOGEN, NITRITE, LEUKOCYTESUR in the last 72 hours.  Invalid input(s): APPERANCEUR    Imaging: No results found.   Medications:   . octreotide  (SANDOSTATIN)    IV infusion 50 mcg/hr (01/28/17 0406)   . albumin human  25 g Intravenous Once  . cefTRIAXone (ROCEPHIN) IVPB 2 gram/50 mL D5W (Pyxis)  2 g Intravenous Q24H  . feeding supplement (NEPRO CARB STEADY)  237 mL Oral BID BM  . feeding supplement (PRO-STAT SUGAR FREE 64)  30 mL Oral BID  . folic acid  1 mg Oral Daily  . hydrocerin   Topical BID  . hydrocortisone sod succinate (SOLU-CORTEF) inj  25 mg Intravenous Q12H  . insulin aspart  0-15 Units Subcutaneous TID WC  . insulin aspart  0-5 Units Subcutaneous QHS  . insulin glargine  10 Units Subcutaneous QHS  . ipratropium-albuterol  3 mL Nebulization Q6H  . mouth rinse  15 mL Mouth Rinse BID  . metronidazole  500 mg Intravenous Q8H  . midodrine  10 mg Oral TID WC  . pantoprazole  40 mg Oral BID AC  . phytonadione  10 mg Subcutaneous Daily  . sodium chloride flush  10-40 mL Intracatheter Q12H  . sucralfate  1 g Oral TID WC & HS   sodium chloride, alteplase, ondansetron (ZOFRAN) IV, sodium chloride flush  Assessment/ Plan:  [name] is a 56 y.o. white male with decubitus ulcer, hypertension, congestive heart failure,  atrial fibrillation, Alcoholic cirrhosis, depression who was [facility] from 1/14 to 2/2 for sepsis. He was discharged with metronidazole and ceftriaxone for a total of six weeks. Patient was [facility] on 01/27/2017 with sepsis.   1. Acute renal failure:  anuric. Off vasopressors. Baseline creatinine of 0.86 on 01/11/17 CRRT on 2/9-2/15. -  dose all medications for Cr Cl < 10 - monitor renal function, electrolytes, urine output and volume status.  - UF only dialysis session with iv albumin to attempt some volume removal  2.  Alcoholic cirrhosis, Anasarca/Edema - 3rd spacing from low albumin,   -Still edematous  3. Hyperphosphatemia - [facility] - improved     LOS: 7 Kloey Cazarez 2/16/201810:42 AM

## 2017-01-29 DIAGNOSIS — J9601 Acute respiratory failure with hypoxia: Secondary | ICD-10-CM

## 2017-01-29 LAB — RENAL FUNCTION PANEL
ALBUMIN: 2.5 g/dL — AB (ref 3.5–5.0)
Anion gap: 6 (ref 5–15)
BUN: 32 mg/dL — ABNORMAL HIGH (ref 6–20)
CALCIUM: 8.8 mg/dL — AB (ref 8.9–10.3)
CO2: 24 mmol/L (ref 22–32)
CREATININE: 3.15 mg/dL — AB (ref 0.61–1.24)
Chloride: 104 mmol/L (ref 101–111)
GFR calc non Af Amer: 21 mL/min — ABNORMAL LOW (ref >60)
GFR, EST AFRICAN AMERICAN: 24 mL/min — AB (ref >60)
GLUCOSE: 129 mg/dL — AB (ref 65–99)
PHOSPHORUS: 3.4 mg/dL (ref 2.5–4.6)
Potassium: 4 mmol/L (ref 3.5–5.1)
SODIUM: 134 mmol/L — AB (ref 135–145)

## 2017-01-29 LAB — GLUCOSE, CAPILLARY
GLUCOSE-CAPILLARY: 89 mg/dL (ref 65–99)
Glucose-Capillary: 114 mg/dL — ABNORMAL HIGH (ref 65–99)
Glucose-Capillary: 130 mg/dL — ABNORMAL HIGH (ref 65–99)
Glucose-Capillary: 129 mg/dL — ABNORMAL HIGH (ref 65–99)

## 2017-01-29 NOTE — Progress Notes (Signed)
[facility]  ROUNDING NOTE   Subjective:   CRRT discontinued on 2/15 Awake, alert, UOP still low Able to eat some. No nausea/Vomiting reported Reports GERD symptoms especially with citrus foods   Objective:  Vital signs in last 24 hours:  Temp:  [94.6 F (34.8 C)-97.8 F (36.6 C)] 97.6 F (36.4 C) (02/17 1302) Pulse Rate:  [72-99] 72 (02/17 1302) Resp:  [10-28] 20 (02/17 1302) BP: (88-120)/(66-87) 120/87 (02/17 1302) SpO2:  [84 %-97 %] 97 % (02/17 1302) Weight:  [137.5 kg (303 lb 3.2 oz)-138.7 kg (305 lb 12.8 oz)] 138.7 kg (305 lb 12.8 oz) (02/17 0500)  Weight change: 2.964 kg (6 lb 8.6 oz) Filed Weights   01/28/17 1410 01/28/17 2125 01/29/17 0500  Weight: (!) 138 kg (304 lb 3.8 oz) (!) 137.5 kg (303 lb 3.2 oz) (!) 138.7 kg (305 lb 12.8 oz)    Intake/Output: I/O last 3 completed shifts: In: 2671.8 [P.O.:480; I.V.:1991.8; IV Piggyback:200] Out: 1147 [Urine:145; Other:1000; Stool:2]   Intake/Output this shift:  No intake/output data recorded.  Physical Exam: General: Critically ill , sitting up in bed  Head: Normocephalic, atraumatic. Moist oral mucosal membranes  Eyes: Anicteric,    Neck: Supple,   Lungs:  Clear to auscultation  Heart: Regular rate and rhythm  Abdomen:  Soft, nontender, obese, distended   Extremities:  2+ dependent peripheral edema. Anasarca  Neurologic: Alert and oriented  Skin: Scattered bruising  Access: Right IJ temp HD catheter 2/9    Basic Metabolic Panel:  Recent Labs Lab 01/26/17 0422  01/26/17 0938 01/26/17 1302 01/26/17 1752 01/26/17 2245 01/27/17 0004 01/27/17 0600 01/28/17 0515 01/29/17 1311  NA 138  < >  --  135 135  --  135 137 138 134*  K 4.0  < >  --  3.9 3.5  --  4.0 4.0 4.2 4.0  CL 107  < >  --  107 108  --  100* 106 108 104  CO2 27  < >  --  24 24  --  24 27 27 24   GLUCOSE 152*  < >  --  228* 194*  --  340* 171* 138* 129*  BUN 22*  < >  --  20 19  --  18 16 22* 32*  CREATININE 1.92*  < >  --  1.86*  1.60*  --  1.68* 1.73* 2.34* 3.15*  CALCIUM 8.7*  < >  --  8.4* 7.6*  --  8.3* 8.6* 8.9 8.8*  MG 1.8  --  1.6* 1.7 1.4* 1.4*  --   --   --   --   PHOS 2.4*  < >  --  2.3* 2.1*  --  11.8*  --  3.4 3.4  < > = values in this interval not displayed.  Liver Function Tests:  Recent Labs Lab 01/25/17 0458  01/26/17 1302 01/26/17 1752 01/27/17 0004 01/28/17 0515 01/29/17 1311  AST 39  --   --   --   --   --   --   ALT 19  --   --   --   --   --   --   ALKPHOS 79  --   --   --   --   --   --   BILITOT 0.8  --   --   --   --   --   --   PROT 5.7*  --   --   --   --   --   --  ALBUMIN 2.1*  < > 2.8* 2.2* 2.4* 2.4* 2.5*  < > = values in this interval not displayed. No results for input(s): LIPASE, AMYLASE in the last 168 hours. No results for input(s): AMMONIA in the last 168 hours.  CBC:  Recent Labs Lab 01/24/17 0630 01/25/17 0458 01/26/17 0422 01/27/17 0600 01/28/17 0553  WBC 11.7* 11.1* 12.7* 11.6* 11.3*  HGB 11.0* 9.8* 8.6* 7.9* 8.7*  HCT 33.0* 29.6* 26.8* 24.6* 26.0*  MCV 102.4* 102.4* 104.2* 105.1* 104.7*  PLT 80* 77* 61* 50* 46*    Cardiac Enzymes: No results for input(s): CKTOTAL, CKMB, CKMBINDEX, TROPONINI in the last 168 hours.  BNP: Invalid input(s): POCBNP  CBG:  Recent Labs Lab 01/28/17 1638 01/28/17 1936 01/28/17 2148 01/29/17 0727 01/29/17 1130  GLUCAP 121* 164* 182* 89 114*    Microbiology: Results for orders placed or performed during the hospital encounter of February 06, 2017  Blood Culture (routine x 2)     Status: None   Collection Time: February 06, 2017 10:27 AM  Result Value Ref Range Status   Specimen Description BLOOD LEFT ARM  Final   Special Requests BOTTLES DRAWN AEROBIC AND ANAEROBIC ANA2ML AER3ML  Final   Culture NO GROWTH 5 DAYS  Final   Report Status 01/26/2017 FINAL  Final  C difficile quick scan w PCR reflex     Status: None   Collection Time: 02/06/17 11:28 AM  Result Value Ref Range Status   C Diff antigen NEGATIVE NEGATIVE Final   C  Diff toxin NEGATIVE NEGATIVE Final   C Diff interpretation No C. difficile detected.  Final  Gastrointestinal Panel by PCR , Stool     Status: None   Collection Time: 06-Feb-2017 11:28 AM  Result Value Ref Range Status   Campylobacter species NOT DETECTED NOT DETECTED Final   Plesimonas shigelloides NOT DETECTED NOT DETECTED Final   Salmonella species NOT DETECTED NOT DETECTED Final   Yersinia enterocolitica NOT DETECTED NOT DETECTED Final   Vibrio species NOT DETECTED NOT DETECTED Final   Vibrio cholerae NOT DETECTED NOT DETECTED Final   Enteroaggregative E coli (EAEC) NOT DETECTED NOT DETECTED Final   Enteropathogenic E coli (EPEC) NOT DETECTED NOT DETECTED Final   Enterotoxigenic E coli (ETEC) NOT DETECTED NOT DETECTED Final   Shiga like toxin producing E coli (STEC) NOT DETECTED NOT DETECTED Final   Shigella/Enteroinvasive E coli (EIEC) NOT DETECTED NOT DETECTED Final   Cryptosporidium NOT DETECTED NOT DETECTED Final   Cyclospora cayetanensis NOT DETECTED NOT DETECTED Final   Entamoeba histolytica NOT DETECTED NOT DETECTED Final   Giardia lamblia NOT DETECTED NOT DETECTED Final   Adenovirus F40/41 NOT DETECTED NOT DETECTED Final   Astrovirus NOT DETECTED NOT DETECTED Final   Norovirus GI/GII NOT DETECTED NOT DETECTED Final   Rotavirus A NOT DETECTED NOT DETECTED Final   Sapovirus (I, II, IV, and V) NOT DETECTED NOT DETECTED Final  Blood Culture (routine x 2)     Status: None   Collection Time: 02-06-2017 11:28 AM  Result Value Ref Range Status   Specimen Description BLOOD RIGHT PICC  Final   Special Requests BOTTLES DRAWN AEROBIC AND ANAEROBIC ANA7ML AER9ML  Final   Culture NO GROWTH 5 DAYS  Final   Report Status 01/26/2017 FINAL  Final  Urine culture     Status: None   Collection Time: 02/06/2017 12:10 PM  Result Value Ref Range Status   Specimen Description URINE, RANDOM  Final   Special Requests NONE  Final   Culture  Final    NO GROWTH Performed at [facility],  [address] [place], [place] [address]    Report Status 01/22/2017 FINAL  Final  MRSA PCR Screening     Status: None   Collection Time: 02-10-17  6:07 PM  Result Value Ref Range Status   MRSA by PCR NEGATIVE NEGATIVE Final    Comment:        The GeneXpert MRSA Assay (FDA approved for NASAL specimens only), is one component of a comprehensive MRSA colonization surveillance program. It is not intended to diagnose MRSA infection nor to guide or monitor treatment for MRSA infections.     Coagulation Studies: No results for input(s): LABPROT, INR in the last 72 hours.  Urinalysis: No results for input(s): COLORURINE, LABSPEC, PHURINE, GLUCOSEU, HGBUR, BILIRUBINUR, KETONESUR, PROTEINUR, UROBILINOGEN, NITRITE, LEUKOCYTESUR in the last 72 hours.  Invalid input(s): APPERANCEUR    Imaging: No results found.   Medications:   . octreotide  (SANDOSTATIN)    IV infusion 50 mcg/hr (01/29/17 1040)   . cefTRIAXone (ROCEPHIN) IVPB 2 gram/50 mL D5W (Pyxis)  2 g Intravenous Q24H  . feeding supplement (NEPRO CARB STEADY)  237 mL Oral TID BM  . feeding supplement (PRO-STAT SUGAR FREE 64)  30 mL Oral BID  . folic acid  1 mg Oral Daily  . hydrocerin   Topical BID  . hydrocortisone sod succinate (SOLU-CORTEF) inj  25 mg Intravenous Q12H  . insulin aspart  0-15 Units Subcutaneous TID WC  . insulin aspart  0-5 Units Subcutaneous QHS  . insulin glargine  10 Units Subcutaneous QHS  . mouth rinse  15 mL Mouth Rinse BID  . metroNIDAZOLE  500 mg Oral Q8H  . midodrine  10 mg Oral TID WC  . pantoprazole  40 mg Oral BID AC  . sodium chloride flush  10-40 mL Intracatheter Q12H  . sucralfate  1 g Oral TID WC & HS   sodium chloride, ipratropium-albuterol, ondansetron (ZOFRAN) IV, sodium chloride flush  Assessment/ Plan:  Mr. JAQUESE IRVING is a 56 y.o. white male with decubitus ulcer, hypertension, congestive heart failure, atrial fibrillation, Alcoholic cirrhosis, depression who was admitted to  [facility] from 1/14 to 2/2 for sepsis. He was discharged with metronidazole and ceftriaxone for a total of six weeks. Patient was admitted to [facility] on 02/10/17 with sepsis.   1. Acute renal failure:  anuric. Off vasopressors. Baseline creatinine of 0.86 on 01/11/17 CRRT on 2/9-2/15. -  dose all medications for Cr Cl < 10 - monitor renal function, electrolytes, urine output and volume status.  - UF only dialysis session with iv albumin done on 2/16, 1000 cc volume removed - K 4.0. Electrolytes and Volume status are acceptable No acute indication for Dialysis at present   2.  Alcoholic cirrhosis, Anasarca/Edema - 3rd spacing from low albumin,   -Still edematous  3. Hyperphosphatemia - change ensure to [facility] - improved     LOS: 8 Niaomi Cartaya 2/17/20182:16 PM

## 2017-01-29 NOTE — Evaluation (Signed)
Physical Therapy Evaluation Patient Details Name: [name] MRN: [id number] DOB: 23-Aug-1961 Today's Date: 01/29/2017   History of Present Illness  presented to ER from STR secondary to hypotension, nausea/vomiting; admitted with septic shock secondary to infected sacral decubitus (stage IV), renal failure. Initially requiring high-dose pressors (now weaned) and CRRT (2/9-2/15) now on intermittent HD via R IJ temp dialysis access.  Clinical Impression  Patient known to therapist from previous admissions, as this is his third admission in recent six weeks.  Patient globally weak and deconditioned with significant pitting edema throughout all extremities (distal > proximal, LE > UE).  Currently requiring mod assist for rolling in bed due to profound functional weakness; refuses further attempts at supine/sit or OOB despite encouragement. Anticipate very slow, gradual recovery due to extent of weakness, medical complexities. Will continue mobility assessment and progression as tolerated.  Will be somewhat limited due to temporary HD catheter and sitting restrictions related to sacral decub. Would benefit from skilled PT to address above deficits and promote optimal return to PLOF; [facility] for [facility] therapy and long-term medical monitoring.    Follow Up Recommendations LTACH    Equipment Recommendations       Recommendations for Other Services       Precautions / Restrictions Precautions Precautions: Fall Precaution Comments: Sacral decub, R IJ temp dialysis catheter; HOB <30 degrees per wound care Restrictions Weight Bearing Restrictions: No      Mobility  Bed Mobility Overal bed mobility: Needs Assistance Bed Mobility: Rolling Rolling: Mod assist         General bed mobility comments: refused supine/sit or OOB attempts  Transfers                 General transfer comment: refused supine/sit or OOB  attempts  Ambulation/Gait             General Gait Details: refused supine/sit or OOB attempts  Stairs            Wheelchair Mobility    Modified Rankin (Stroke Patients Only)       Balance                                             Pertinent Vitals/Pain Pain Assessment: 0-10 Pain Score: 4  Pain Location: generalized soreness, soreness in sacral area Pain Descriptors / Indicators: Aching;Grimacing Pain Intervention(s): Limited activity within patient's tolerance;Monitored during session;Repositioned    Home Living Family/patient expects to be discharged to::  (Planning to pursue LTAC upon discharge from acute care) Living Arrangements: Spouse/significant other Available Help at Discharge: Family;Available 24 hours/day Type of Home: Apartment       Home Layout: Two level;1/2 bath on main level Home Equipment: Bedside commode;Toilet riser Additional Comments: Has been sleeping in recliner/lift chair in recent weeks    Prior Function           Comments: Limited household mobility; assist from girlfriend for ADLs (mod assist for LB bathing, dressing, don/doffing compression stockings, max assist for wound care), mobility as needed. Was driving prior to the past few weeks  Progressive functional decline due to multiple recent hospitalizations     Hand Dominance        Extremity/Trunk Assessment   Upper Extremity Assessment Upper Extremity Assessment: Generalized weakness (grossly 3+/5, generalized pitting edema distal > proximal)    Lower Extremity Assessment  Lower Extremity Assessment: Generalized weakness (grossly 3+/5, generalized pitting edema distal > proximal)       Communication   Communication: No difficulties  Cognition Arousal/Alertness: Awake/alert Behavior During Therapy: WFL for tasks assessed/performed Overall Cognitive Status: Within Functional Limits for tasks assessed                      General  Comments      Exercises Other Exercises Other Exercises: Supine LE therex, 1x10, active ROM with manual resistance as appropriate: ankle pumps, SAQ, heel slides with resisted extension, hip abduct/adduct, unilateral bridging.  Min cuing for technique and consistent active effort   Assessment/Plan    PT Assessment Patient needs continued PT services  PT Problem List Decreased strength;Decreased mobility;Decreased range of motion;Decreased activity tolerance;Decreased balance;Pain;Obesity;Cardiopulmonary status limiting activity;Decreased cognition;Decreased knowledge of use of DME;Decreased safety awareness;Decreased knowledge of precautions;Decreased skin integrity          PT Treatment Interventions DME instruction;Gait training;Stair training;Functional mobility training;Therapeutic activities;Therapeutic exercise;Balance training;Patient/family education    PT Goals (Current goals can be found in the Care Plan section)  Acute Rehab PT Goals Patient Stated Goal: to get stronger and get this wound taken care of PT Goal Formulation: With patient Time For Goal Achievement: 02/12/17 Potential to Achieve Goals: Fair Additional Goals Additional Goal #1: Assess and establish goals for OOB activities as appropriate.    Frequency Min 2X/week   Barriers to discharge Inaccessible home environment      Co-evaluation               End of Session     Patient left: with call bell/phone within reach;in bed;with bed alarm set           Time: 1610-9604 PT Time Calculation (min) (ACUTE ONLY): 24 min   Charges:   PT Evaluation $PT Eval Low Complexity: 1 Procedure PT Treatments $Therapeutic Exercise: 8-22 mins   PT G Codes:        Wrenly Lauritsen H. Manson Passey, PT, DPT, NCS 01/29/17, 11:18 AM [phone]

## 2017-01-29 NOTE — Progress Notes (Addendum)
Pulmonary / Critical Note  Mr. [name] has been transferred to the floor. He states he is doing quite well. Received dialysis yesterday. Only complaints are of-her esophageal reflux disease. Has been working with physical therapy. Being followed by nephrology.  Vitals:   01/28/17 2125 01/29/17 0500 01/29/17 0557 01/29/17 0821  BP:   115/74 108/79  Pulse:   82 83  Resp:   16 20  Temp:   97.4 F (36.3 C) 97.8 F (36.6 C)  TempSrc:   Oral Oral  SpO2:   95% 94%  Weight: (!) 137.5 kg (303 lb 3.2 oz) (!) 138.7 kg (305 lb 12.8 oz)    Height: 6\' 4"  (1.93 m)     Room air  Chronically ill appearing, NAD, RASS 0 HEENT WNL Clear anteriorly RRR s M NABS, soft Ext warm, Symmetric LE edema with chronic stasis changes  BMP Latest Ref Rng & Units 01/28/2017 01/27/2017 01/27/2017  Glucose 65 - 99 mg/dL 409(W138(H) 119(J171(H) 478(G340(H)  BUN 6 - 20 mg/dL 95(A22(H) 16 18  Creatinine 0.61 - 1.24 mg/dL 2.13(Y2.34(H) 8.65(H1.73(H) 8.46(N1.68(H)  Sodium 135 - 145 mmol/L 138 137 135  Potassium 3.5 - 5.1 mmol/L 4.2 4.0 4.0  Chloride 101 - 111 mmol/L 108 106 100(L)  CO2 22 - 32 mmol/L 27 27 24   Calcium 8.9 - 10.3 mg/dL 8.9 6.2(X8.6(L) 8.3(L)   CBC Latest Ref Rng & Units 01/28/2017 01/27/2017 01/26/2017  WBC 3.8 - 10.6 K/uL 11.3(H) 11.6(H) 12.7(H)  Hemoglobin 13.0 - 18.0 g/dL 5.2(W8.7(L) 7.9(L) 8.6(L)  Hematocrit 40.0 - 52.0 % 26.0(L) 24.6(L) 26.8(L)  Platelets 150 - 440 K/uL 46(L) 50(L) 61(L)   CXR: No new film  IMPRESSION: 1) Hypoxic respiratory failure, resolved Presently on room air 2)  AKI post hemodialysis, being followed by nephrology 3) Alcoholic cirrhosis with portal gastropathy 4) Anemia, hemoglobin 8.7, no clear evidence of active bleeding at this time 5) Chronic thrombocytopenia, likely decreased from yesterday to 46 6) Chronic, severe debilitation 7) DNR/DNI in event of cardiopulmonary arrest. Full aggressive medical care short of that  We'll transfer to the hospitalist service for tomorrow Dr. Judithann SheenSparks notified Dr. Juliene PinaMody  Attending Tora KindredJohn Garrie Woodin, DO PCCM Service 01/29/2017

## 2017-01-30 ENCOUNTER — Encounter: Payer: Self-pay | Admitting: *Deleted

## 2017-01-30 LAB — GLUCOSE, CAPILLARY
Glucose-Capillary: 110 mg/dL — ABNORMAL HIGH (ref 65–99)
Glucose-Capillary: 97 mg/dL (ref 65–99)
Glucose-Capillary: 162 mg/dL — ABNORMAL HIGH (ref 65–99)
Glucose-Capillary: 219 mg/dL — ABNORMAL HIGH (ref 65–99)

## 2017-01-30 LAB — BASIC METABOLIC PANEL
Anion gap: 7 (ref 5–15)
BUN: 40 mg/dL — ABNORMAL HIGH (ref 6–20)
CALCIUM: 8.6 mg/dL — AB (ref 8.9–10.3)
CO2: 23 mmol/L (ref 22–32)
CREATININE: 3.72 mg/dL — AB (ref 0.61–1.24)
Chloride: 104 mmol/L (ref 101–111)
GFR, EST AFRICAN AMERICAN: 19 mL/min — AB (ref >60)
GFR, EST NON AFRICAN AMERICAN: 17 mL/min — AB (ref >60)
GLUCOSE: 130 mg/dL — AB (ref 65–99)
Potassium: 3.9 mmol/L (ref 3.5–5.1)
Sodium: 134 mmol/L — ABNORMAL LOW (ref 135–145)

## 2017-01-30 NOTE — Progress Notes (Signed)
[facility]  ROUNDING NOTE   Subjective:   CRRT discontinued on 2/15 Awake, alert, UOP still low Able to eat some. No nausea/Vomiting reported No c/o SOB    Objective:  Vital signs in last 24 hours:  Temp:  [97.6 F (36.4 C)-98.4 F (36.9 C)] 98.4 F (36.9 C) (02/18 0739) Pulse Rate:  [72-86] 72 (02/18 0739) Resp:  [16-20] 18 (02/18 0739) BP: (97-120)/(75-87) 120/87 (02/18 0739) SpO2:  [93 %-97 %] 93 % (02/18 0436) Weight:  [141.7 kg (312 lb 4.8 oz)] 141.7 kg (312 lb 4.8 oz) (02/18 0500)  Weight change: 3.658 kg (8 lb 1 oz) Filed Weights   01/28/17 2125 01/29/17 0500 01/30/17 0500  Weight: (!) 137.5 kg (303 lb 3.2 oz) (!) 138.7 kg (305 lb 12.8 oz) (!) 141.7 kg (312 lb 4.8 oz)    Intake/Output: I/O last 3 completed shifts: In: 2060.1 [P.O.:960; I.V.:1100.1] Out: 106 [Urine:105; Stool:1]   Intake/Output this shift:  Total I/O In: -  Out: 75 [Urine:75]  Physical Exam: General: Critically ill , sitting up in bed  Head: Normocephalic, atraumatic. Moist oral mucosal membranes  Eyes: Anicteric,    Neck: Supple,   Lungs:  Clear to auscultation  Heart: Regular rate and rhythm  Abdomen:  Soft, nontender, obese, distended   Extremities:  2+ dependent peripheral edema. Anasarca  Neurologic: Alert and oriented  Skin: Scattered bruising  Access: Right IJ temp HD catheter 2/9    Basic Metabolic Panel:  Recent Labs Lab 01/26/17 0422  01/26/17 0938 01/26/17 1302 01/26/17 1752 01/26/17 2245 01/27/17 0004 01/27/17 0600 01/28/17 0515 01/29/17 1311  NA 138  < >  --  135 135  --  135 137 138 134*  K 4.0  < >  --  3.9 3.5  --  4.0 4.0 4.2 4.0  CL 107  < >  --  107 108  --  100* 106 108 104  CO2 27  < >  --  24 24  --  24 27 27 24   GLUCOSE 152*  < >  --  228* 194*  --  340* 171* 138* 129*  BUN 22*  < >  --  20 19  --  18 16 22* 32*  CREATININE 1.92*  < >  --  1.86* 1.60*  --  1.68* 1.73* 2.34* 3.15*  CALCIUM 8.7*  < >  --  8.4* 7.6*  --  8.3* 8.6* 8.9  8.8*  MG 1.8  --  1.6* 1.7 1.4* 1.4*  --   --   --   --   PHOS 2.4*  < >  --  2.3* 2.1*  --  11.8*  --  3.4 3.4  < > = values in this interval not displayed.  Liver Function Tests:  Recent Labs Lab 01/25/17 0458  01/26/17 1302 01/26/17 1752 01/27/17 0004 01/28/17 0515 01/29/17 1311  AST 39  --   --   --   --   --   --   ALT 19  --   --   --   --   --   --   ALKPHOS 79  --   --   --   --   --   --   BILITOT 0.8  --   --   --   --   --   --   PROT 5.7*  --   --   --   --   --   --   ALBUMIN  2.1*  < > 2.8* 2.2* 2.4* 2.4* 2.5*  < > = values in this interval not displayed. No results for input(s): LIPASE, AMYLASE in the last 168 hours. No results for input(s): AMMONIA in the last 168 hours.  CBC:  Recent Labs Lab 01/24/17 0630 01/25/17 0458 01/26/17 0422 01/27/17 0600 01/28/17 0553  WBC 11.7* 11.1* 12.7* 11.6* 11.3*  HGB 11.0* 9.8* 8.6* 7.9* 8.7*  HCT 33.0* 29.6* 26.8* 24.6* 26.0*  MCV 102.4* 102.4* 104.2* 105.1* 104.7*  PLT 80* 77* 61* 50* 46*    Cardiac Enzymes: No results for input(s): CKTOTAL, CKMB, CKMBINDEX, TROPONINI in the last 168 hours.  BNP: Invalid input(s): POCBNP  CBG:  Recent Labs Lab 01/29/17 1130 01/29/17 1647 01/29/17 2211 01/30/17 0723 01/30/17 1110  GLUCAP 114* 130* 129* 110* 97    Microbiology: Results for orders placed or performed during the hospital encounter of February 12, 2017  Blood Culture (routine x 2)     Status: None   Collection Time: 12-Feb-2017 10:27 AM  Result Value Ref Range Status   Specimen Description BLOOD LEFT ARM  Final   Special Requests BOTTLES DRAWN AEROBIC AND ANAEROBIC ANA2ML AER3ML  Final   Culture NO GROWTH 5 DAYS  Final   Report Status 01/26/2017 FINAL  Final  C difficile quick scan w PCR reflex     Status: None   Collection Time: Feb 12, 2017 11:28 AM  Result Value Ref Range Status   C Diff antigen NEGATIVE NEGATIVE Final   C Diff toxin NEGATIVE NEGATIVE Final   C Diff interpretation No C. difficile detected.   Final  Gastrointestinal Panel by PCR , Stool     Status: None   Collection Time: February 12, 2017 11:28 AM  Result Value Ref Range Status   Campylobacter species NOT DETECTED NOT DETECTED Final   Plesimonas shigelloides NOT DETECTED NOT DETECTED Final   Salmonella species NOT DETECTED NOT DETECTED Final   Yersinia enterocolitica NOT DETECTED NOT DETECTED Final   Vibrio species NOT DETECTED NOT DETECTED Final   Vibrio cholerae NOT DETECTED NOT DETECTED Final   Enteroaggregative E coli (EAEC) NOT DETECTED NOT DETECTED Final   Enteropathogenic E coli (EPEC) NOT DETECTED NOT DETECTED Final   Enterotoxigenic E coli (ETEC) NOT DETECTED NOT DETECTED Final   Shiga like toxin producing E coli (STEC) NOT DETECTED NOT DETECTED Final   Shigella/Enteroinvasive E coli (EIEC) NOT DETECTED NOT DETECTED Final   Cryptosporidium NOT DETECTED NOT DETECTED Final   Cyclospora cayetanensis NOT DETECTED NOT DETECTED Final   Entamoeba histolytica NOT DETECTED NOT DETECTED Final   Giardia lamblia NOT DETECTED NOT DETECTED Final   Adenovirus F40/41 NOT DETECTED NOT DETECTED Final   Astrovirus NOT DETECTED NOT DETECTED Final   Norovirus GI/GII NOT DETECTED NOT DETECTED Final   Rotavirus A NOT DETECTED NOT DETECTED Final   Sapovirus (I, II, IV, and V) NOT DETECTED NOT DETECTED Final  Blood Culture (routine x 2)     Status: None   Collection Time: 12-Feb-2017 11:28 AM  Result Value Ref Range Status   Specimen Description BLOOD RIGHT PICC  Final   Special Requests BOTTLES DRAWN AEROBIC AND ANAEROBIC ANA7ML AER9ML  Final   Culture NO GROWTH 5 DAYS  Final   Report Status 01/26/2017 FINAL  Final  Urine culture     Status: None   Collection Time: 12-Feb-2017 12:10 PM  Result Value Ref Range Status   Specimen Description URINE, RANDOM  Final   Special Requests NONE  Final   Culture   Final  NO GROWTH Performed at [facility], [address], [place], [place] [address]    Report Status 01/22/2017 FINAL  Final  MRSA  PCR Screening     Status: None   Collection Time: 01/19/2017  6:07 PM  Result Value Ref Range Status   MRSA by PCR NEGATIVE NEGATIVE Final    Comment:        The GeneXpert MRSA Assay (FDA approved for NASAL specimens only), is one component of a comprehensive MRSA colonization surveillance program. It is not intended to diagnose MRSA infection nor to guide or monitor treatment for MRSA infections.     Coagulation Studies: No results for input(s): LABPROT, INR in the last 72 hours.  Urinalysis: No results for input(s): COLORURINE, LABSPEC, PHURINE, GLUCOSEU, HGBUR, BILIRUBINUR, KETONESUR, PROTEINUR, UROBILINOGEN, NITRITE, LEUKOCYTESUR in the last 72 hours.  Invalid input(s): APPERANCEUR    Imaging: No results found.   Medications:   . octreotide  (SANDOSTATIN)    IV infusion 50 mcg/hr (01/30/17 0939)   . cefTRIAXone (ROCEPHIN) IVPB 2 gram/50 mL D5W (Pyxis)  2 g Intravenous Q24H  . feeding supplement (NEPRO CARB STEADY)  237 mL Oral TID BM  . feeding supplement (PRO-STAT SUGAR FREE 64)  30 mL Oral BID  . folic acid  1 mg Oral Daily  . hydrocerin   Topical BID  . insulin aspart  0-15 Units Subcutaneous TID WC  . insulin aspart  0-5 Units Subcutaneous QHS  . insulin glargine  10 Units Subcutaneous QHS  . mouth rinse  15 mL Mouth Rinse BID  . metroNIDAZOLE  500 mg Oral Q8H  . midodrine  10 mg Oral TID WC  . pantoprazole  40 mg Oral BID AC  . sodium chloride flush  10-40 mL Intracatheter Q12H  . sucralfate  1 g Oral TID WC & HS   sodium chloride, ipratropium-albuterol, ondansetron (ZOFRAN) IV, sodium chloride flush  Assessment/ Plan:  Mr. [name] is a 57 y.o. white male with decubitus ulcer, hypertension, congestive heart failure, atrial fibrillation, Alcoholic cirrhosis, depression who was admitted to [facility] from 1/14 to 2/2 for sepsis. He was discharged with metronidazole and ceftriaxone for a total of six weeks. Patient was admitted to [facility] on 01/16/2017 with  sepsis.   1. Acute renal failure:  anuric. Off vasopressors. Baseline creatinine of 0.86 on 01/11/17 CRRT on 2/9-2/15. -  dose all medications for Cr Cl < 10 - monitor renal function, electrolytes, urine output and volume status.  - UF only dialysis session with iv albumin done on 2/16, 1000 cc volume removed - K 4.0. Electrolytes and Volume status are acceptable No acute indication for Dialysis at present  - Monitor Labs daily - primary team looking in to LTAC  2.  Alcoholic cirrhosis, Anasarca/Edema - 3rd spacing from low albumin,   -Still edematous  3. Hyperphosphatemia - change ensure to [facility] - improved     LOS: 9 [name] 2/18/201812:05 PM

## 2017-01-30 NOTE — Progress Notes (Signed)
[employer] [facility] [place] [facility]   PATIENT NAME: [name]    MR#:  [id number]  DATE OF BIRTH:  Apr 17, 1961  SUBJECTIVE:   Doing well this am no SOB  REVIEW OF SYSTEMS:    Review of Systems  Constitutional: Negative.  Negative for chills, fever and malaise/fatigue.  HENT: Negative.  Negative for ear discharge, ear pain, hearing loss, nosebleeds and sore throat.   Eyes: Negative.  Negative for blurred vision and pain.  Respiratory: Negative.  Negative for cough, hemoptysis, shortness of breath and wheezing.   Cardiovascular: Negative.  Negative for chest pain, palpitations and leg swelling.  Gastrointestinal: Negative.  Negative for abdominal pain, blood in stool, diarrhea, nausea and vomiting.  Genitourinary: Negative.  Negative for dysuria.  Musculoskeletal: Negative.  Negative for back pain.  Skin: Negative.   Neurological: Negative for dizziness, tremors, speech change, focal weakness, seizures and headaches.  Endo/Heme/Allergies: Negative.  Does not bruise/bleed easily.  Psychiatric/Behavioral: Negative.  Negative for depression, hallucinations and suicidal ideas.    Tolerating Diet: yes      DRUG ALLERGIES:   Allergies  Allergen Reactions  . Clindamycin/Lincomycin Diarrhea    VITALS:  Blood pressure 120/87, pulse 72, temperature 98.4 F (36.9 C), temperature source Oral, resp. rate 18, height 6\' 4"  (1.93 m), weight (!) 141.7 kg (312 lb 4.8 oz), SpO2 93 %.  PHYSICAL EXAMINATION:   Physical Exam  Constitutional: He is oriented to person, place, and time and well-developed, well-nourished, and in no distress. No distress.  HENT:  Head: Normocephalic.  Right IJ  Eyes: No scleral icterus.  Neck: Normal range of motion. Neck supple. No JVD present. No tracheal deviation present.  Cardiovascular: Normal rate, regular rhythm and normal heart sounds.  Exam reveals no gallop and no friction rub.   No murmur heard. Pulmonary/Chest: Effort  normal and breath sounds normal. No respiratory distress. He has no wheezes. He has no rales. He exhibits no tenderness.  Abdominal: Soft. Bowel sounds are normal. He exhibits distension. He exhibits no mass. There is no tenderness. There is no rebound and no guarding.  Musculoskeletal: Normal range of motion. He exhibits edema.  Neurological: He is alert and oriented to person, place, and time.  Skin: Skin is warm. No rash noted. No erythema.  Psychiatric: Affect and judgment normal.      LABORATORY PANEL:   CBC  Recent Labs Lab 01/28/17 0553  WBC 11.3*  HGB 8.7*  HCT 26.0*  PLT 46*   ------------------------------------------------------------------------------------------------------------------  Chemistries   Recent Labs Lab 01/25/17 0458  01/26/17 2245  01/29/17 1311  NA 136  < >  --   < > 134*  K 4.4  < >  --   < > 4.0  CL 106  < >  --   < > 104  CO2 26  < >  --   < > 24  GLUCOSE 215*  < >  --   < > 129*  BUN 23*  < >  --   < > 32*  CREATININE 2.15*  < >  --   < > 3.15*  CALCIUM 8.8*  < >  --   < > 8.8*  MG 1.8  < > 1.4*  --   --   AST 39  --   --   --   --   ALT 19  --   --   --   --   ALKPHOS 79  --   --   --   --  BILITOT 0.8  --   --   --   --   < > = values in this interval not displayed. ------------------------------------------------------------------------------------------------------------------  Cardiac Enzymes No results for input(s): TROPONINI in the last 168 hours. ------------------------------------------------------------------------------------------------------------------  RADIOLOGY:  No results found.   ASSESSMENT AND PLAN:    56 year old male with EtOH related liver cirrhosis and portal gastropathy who was recently discharged for sacral decubitus presents with septic shock due to infected sacral decubitus and acute kidney failure due to sepsis.   1. Acute hypoxic respiratory failure in the setting of pulmonary vascular  congestion due to renal failure Currently maintaining oxygenation  2. Acute kidney failure with anuria received CRT from February 9 3 for very 15th with improved urine output No dialysis is recommended as per nephrology at this time. Monitor urine output and BMP  3. EtOH liver cirrhosis with anasarca and portal hypertensive gastropathy with slow GI bleed: Continue IV PPI and Carafate. Continue octreotide Hemoglobin has remained stable and no indication for blood transfusion Follow-up on GI recommendations   4. Chronic thrombocytopenia due to problem #3  5. Septic shock from infected sacral decubitus: Continue ceftriaxone and Flagyl as per ID recommendations    Management plans discussed with the patient and he is in agreement.  CODE STATUS: LIMITED  TOTAL TIME TAKING CARE OF THIS PATIENT: 30 minutes.     POSSIBLE D/C LTAC 1-2 days, DEPENDING ON CLINICAL CONDITION.   Marlia Schewe M.D on 01/30/2017 at 10:36 AM  Between 7am to 6pm - Pager - [phone] After 6pm go to [email or web address] - password [email or web address]  [facility]  Office  [phone]  CC: Primary care physician; Corky DownsMASOUD,[name], MD  Note: This dictation was prepared with Dragon dictation along with smaller phrase technology. Any transcriptional errors that result from this process are unintentional.

## 2017-01-31 LAB — GLUCOSE, CAPILLARY
GLUCOSE-CAPILLARY: 76 mg/dL (ref 65–99)
GLUCOSE-CAPILLARY: 108 mg/dL — AB (ref 65–99)
GLUCOSE-CAPILLARY: 106 mg/dL — AB (ref 65–99)
Glucose-Capillary: 87 mg/dL (ref 65–99)

## 2017-01-31 LAB — BASIC METABOLIC PANEL
ANION GAP: 5 (ref 5–15)
BUN: 48 mg/dL — ABNORMAL HIGH (ref 6–20)
CHLORIDE: 104 mmol/L (ref 101–111)
CO2: 24 mmol/L (ref 22–32)
Calcium: 8.5 mg/dL — ABNORMAL LOW (ref 8.9–10.3)
Creatinine, Ser: 4.32 mg/dL — ABNORMAL HIGH (ref 0.61–1.24)
GFR calc Af Amer: 16 mL/min — ABNORMAL LOW (ref >60)
GFR calc non Af Amer: 14 mL/min — ABNORMAL LOW (ref >60)
GLUCOSE: 75 mg/dL (ref 65–99)
Potassium: 3.5 mmol/L (ref 3.5–5.1)
Sodium: 133 mmol/L — ABNORMAL LOW (ref 135–145)

## 2017-01-31 LAB — CBC
HCT: 26.8 % — ABNORMAL LOW (ref 40.0–52.0)
HEMOGLOBIN: 8.8 g/dL — AB (ref 13.0–18.0)
MCH: 34.4 pg — AB (ref 26.0–34.0)
MCHC: 32.7 g/dL (ref 32.0–36.0)
MCV: 105.4 fL — ABNORMAL HIGH (ref 80.0–100.0)
Platelets: 70 10*3/uL — ABNORMAL LOW (ref 150–440)
RBC: 2.55 MIL/uL — AB (ref 4.40–5.90)
RDW: 20.9 % — ABNORMAL HIGH (ref 11.5–14.5)
WBC: 12.2 10*3/uL — AB (ref 3.8–10.6)

## 2017-01-31 LAB — PHOSPHORUS: Phosphorus: 4 mg/dL (ref 2.5–4.6)

## 2017-01-31 NOTE — Progress Notes (Signed)
Pre HD  

## 2017-01-31 NOTE — Progress Notes (Signed)
  End of hd 

## 2017-01-31 NOTE — Progress Notes (Signed)
   01/31/2017 1153  Pressure Injury 12/27/16 Unstageable - Full thickness tissue loss in which the base of the ulcer is covered by slough (yellow, tan, gray, green or brown) and/or eschar (tan, brown or black) in the wound bed. necrotic  Date First Assessed/Time First Assessed: 12/27/16 1700   Location: Buttocks  Location Orientation: Right;Lower  Staging: Unstageable - Full thickness tissue loss in which the base of the ulcer is covered by slough (yellow, tan, gray, green or brown) a...  Dressing Type Gauze (Comment);Moist to moist (pink foam to cover)  Dressing Changed  State of Healing Eschar  % Wound base Red or Granulating 75%  % Wound base Yellow/Fibrinous Exudate 15%  % Wound base Black/Eschar 5%  % Wound base Other/Granulation Tissue (Comment) 5%  Peri-wound Assessment Erythema (blanchable) (erythema, )  Wound Length (cm) 3 cm  Wound Width (cm) 1.8 cm  Wound Depth (cm) 6.6 cm  Drainage Amount Scant  Drainage Description Serosanguineous;No odor  Treatment Cleansed (with NS)

## 2017-01-31 NOTE — Progress Notes (Signed)
[facility] [place] [facility]   PATIENT NAME: [name] BrasMichael Egger    MR#:  [id number]  DATE OF BIRTH:  03/04/1961  SUBJECTIVE:   Patient seen while in dialysis this morning   blood pressure low this morning and after being given midodrine blood pressure improved  Still with profound anasarca  REVIEW OF SYSTEMS:    Review of Systems  Constitutional: Negative.  Negative for chills, fever and malaise/fatigue.  HENT: Negative.  Negative for ear discharge, ear pain, hearing loss, nosebleeds and sore throat.   Eyes: Negative.  Negative for blurred vision and pain.  Respiratory: Negative.  Negative for cough, hemoptysis, shortness of breath and wheezing.   Cardiovascular: Negative.  Negative for chest pain, palpitations and leg swelling.  Gastrointestinal: Negative.  Negative for abdominal pain, blood in stool, diarrhea, nausea and vomiting.  Genitourinary: Negative.  Negative for dysuria.  Musculoskeletal: Negative.  Negative for back pain.  Skin: Negative.   Neurological: Negative for dizziness, tremors, speech change, focal weakness, seizures and headaches.  Endo/Heme/Allergies: Negative.  Does not bruise/bleed easily.  Psychiatric/Behavioral: Negative.  Negative for depression, hallucinations and suicidal ideas.    Tolerating Diet: yes      DRUG ALLERGIES:   Allergies  Allergen Reactions  . Clindamycin/Lincomycin Diarrhea    VITALS:  Blood pressure 91/64, pulse 91, temperature 97.8 F (36.6 C), temperature source Oral, resp. rate 17, height 6\' 4"  (1.93 m), weight (!) 143 kg (315 lb 4.1 oz), SpO2 91 %.  PHYSICAL EXAMINATION:   Physical Exam  Constitutional: He is oriented to person, place, and time and well-developed, well-nourished, and in no distress. No distress.  HENT:  Head: Normocephalic.  Right IJ  Eyes: No scleral icterus.  Neck: Normal range of motion. Neck supple. No JVD present. No tracheal deviation present.  Cardiovascular: Normal  rate, regular rhythm and normal heart sounds.  Exam reveals no gallop and no friction rub.   No murmur heard. Pulmonary/Chest: Effort normal and breath sounds normal. No respiratory distress. He has no wheezes. He has no rales. He exhibits no tenderness.  Abdominal: Soft. Bowel sounds are normal. He exhibits distension. He exhibits no mass. There is no tenderness. There is no rebound and no guarding.  Musculoskeletal: Normal range of motion. He exhibits edema.  Neurological: He is alert and oriented to person, place, and time.  Skin: Skin is warm. No rash noted. No erythema.  Psychiatric: Affect and judgment normal.      LABORATORY PANEL:   CBC  Recent Labs Lab 01/31/17 0454  WBC 12.2*  HGB 8.8*  HCT 26.8*  PLT 70*   ------------------------------------------------------------------------------------------------------------------  Chemistries   Recent Labs Lab 01/25/17 0458  01/26/17 2245  01/31/17 1020  NA 136  < >  --   < > 133*  K 4.4  < >  --   < > 3.5  CL 106  < >  --   < > 104  CO2 26  < >  --   < > 24  GLUCOSE 215*  < >  --   < > 75  BUN 23*  < >  --   < > 48*  CREATININE 2.15*  < >  --   < > 4.32*  CALCIUM 8.8*  < >  --   < > 8.5*  MG 1.8  < > 1.4*  --   --   AST 39  --   --   --   --   ALT 19  --   --   --   --  ALKPHOS 79  --   --   --   --   BILITOT 0.8  --   --   --   --   < > = values in this interval not displayed. ------------------------------------------------------------------------------------------------------------------  Cardiac Enzymes No results for input(s): TROPONINI in the last 168 hours. ------------------------------------------------------------------------------------------------------------------  RADIOLOGY:  No results found.   ASSESSMENT AND PLAN:    56 year old male with EtOH related liver cirrhosis and portal gastropathy who was recently discharged for sacral decubitus presents with septic shock due to infected sacral  decubitus and acute kidney failure due to sepsis.   1. Acute hypoxic respiratory failure in the setting of pulmonary vascular congestion due to renal failure Currently maintaining oxygenation  2. Acute kidney failure with anuria received CRT from February 9 - 15th with improved urine output Patient is receiving dialysis today due to increasing creatinine and poor urine output  Monitor urine output and BMP  3. EtOH liver cirrhosis with anasarca and portal hypertensive gastropathy with slow GI bleed: Continue IV PPI and Carafate. Continue octreotide for today with discontinuation in the next 1-2 days  Hemoglobin has remained stable and no indication for blood transfusion Follow-up on GI recommendations   4. Chronic thrombocytopenia due to problem #3  5. Septic shock from infected sacral decubitus: Continue ceftriaxone and Flagyl as per ID recommendations    Management plans discussed with the patient and he is in agreement.  CODE STATUS: LIMITED  TOTAL TIME TAKING CARE OF THIS PATIENT: 27 minutes.     POSSIBLE D/C LTAC referral sent by social worker, DEPENDING ON CLINICAL CONDITION.   Quintavius Niebuhr M.D on 01/31/2017 at 11:28 AM  Between 7am to 6pm - Pager - [phone] After 6pm go to [email or web address] - password [email or web address]  [facility]  Office  [phone]  CC: Primary care physician; Corky Downs, MD  Note: This dictation was prepared with Dragon dictation along with smaller phrase technology. Any transcriptional errors that result from this process are unintentional.

## 2017-01-31 NOTE — Progress Notes (Signed)
   01/23/17 0800  Pressure Injury 12/27/16 Unstageable - Full thickness tissue loss in which the base of the ulcer is covered by slough (yellow, tan, gray, green or brown) and/or eschar (tan, brown or black) in the wound bed. necrotic  Date First Assessed/Time First Assessed: 12/27/16 1700   Location: Buttocks  Location Orientation: Right;Lower  Staging: Unstageable - Full thickness tissue loss in which the base of the ulcer is covered by slough (yellow, tan, gray, green or brown) a...  Dressing Type Gauze (Comment)  Dressing Changed  Dressing Change Frequency Twice a day  State of Healing Eschar  Site / Wound Assessment Clean;Bleeding  % Wound base Red or Granulating 60%  % Wound base Yellow/Fibrinous Exudate 10%  % Wound base Black/Eschar 30%  Peri-wound Assessment Bleeding  Wound Length (cm) 8 cm  Wound Width (cm) 4 cm  Wound Depth (cm) 4 cm  Tunneling (cm) 0  Undermining (cm) 0  Margins Unattached edges (unapproximated)  Drainage Amount Moderate  Drainage Description Serosanguineous  Treatment Packing (Saline gauze)

## 2017-01-31 NOTE — Progress Notes (Signed)
Post hd vitals 

## 2017-01-31 NOTE — Progress Notes (Signed)
PT Cancellation Note  Patient Details Name: [name] RouteMichael J Gouin MRN: [id number] DOB: Nov 17, 1961   Cancelled Treatment:    Reason Eval/Treat Not Completed: Patient declined, no reason specified;Other (comment). Treatment attempted; pt refused. Pt had just returned from dialysis. Complains of fatigue and low BP (89/73) per chart review; pt also currently attempting to eat lunch. Offered to return in a short while; pt refuses PT today and wishes to attempt in the morning. Re attempt tomorrow as the schedule allows.    Scot DockHeidi E Keela Rubert, PTA 01/31/2017, 2:44 PM

## 2017-01-31 NOTE — Progress Notes (Signed)
[facility]  ROUNDING NOTE   Subjective:  Overall urine output remains low. Patient seen and evaluated during hemodialysis today. He appears to be tolerating this well at the moment. Abdomen still distended a bit.    Objective:  Vital signs in last 24 hours:  Temp:  [97.5 F (36.4 C)-98.3 F (36.8 C)] 97.8 F (36.6 C) (02/19 1012) Pulse Rate:  [69-95] 81 (02/19 1141) Resp:  [15-18] 15 (02/19 1141) BP: (72-112)/(47-78) 79/59 (02/19 1141) SpO2:  [91 %-98 %] 97 % (02/19 1141) Weight:  [142.6 kg (314 lb 4.8 oz)-143 kg (315 lb 4.1 oz)] 143 kg (315 lb 4.1 oz) (02/19 1012)  Weight change: 0.907 kg (2 lb) Filed Weights   01/30/17 0500 01/31/17 0500 01/31/17 1012  Weight: (!) 141.7 kg (312 lb 4.8 oz) (!) 142.6 kg (314 lb 4.8 oz) (!) 143 kg (315 lb 4.1 oz)    Intake/Output: I/O last 3 completed shifts: In: 1673 [P.O.:600; I.V.:1073] Out: 325 [Urine:325]   Intake/Output this shift:  Total I/O In: 258 [I.V.:258] Out: -   Physical Exam: General: No acute distress  Head: Normocephalic, atraumatic. Moist oral mucosal membranes  Eyes: Anicteric  Neck: Supple  Lungs:  Clear to auscultation  Heart: Regular rate and rhythm  Abdomen:  Soft, nontender, obese, distended   Extremities:  2+ dependent peripheral edema. Anasarca  Neurologic: Alert and oriented  Skin: Scattered bruising  Access: Right IJ temp HD catheter 2/9    Basic Metabolic Panel:  Recent Labs Lab 01/26/17 0422  01/26/17 0938 01/26/17 1302 01/26/17 1752 01/26/17 2245 01/27/17 0004 01/27/17 0600 01/28/17 0515 01/29/17 1311 01/30/17 0702 01/31/17 1020  NA 138  < >  --  135 135  --  135 137 138 134* 134* 133*  K 4.0  < >  --  3.9 3.5  --  4.0 4.0 4.2 4.0 3.9 3.5  CL 107  < >  --  107 108  --  100* 106 108 104 104 104  CO2 27  < >  --  24 24  --  24 27 27 24 23 24   GLUCOSE 152*  < >  --  228* 194*  --  340* 171* 138* 129* 130* 75  BUN 22*  < >  --  20 19  --  18 16 22* 32* 40* 48*   CREATININE 1.92*  < >  --  1.86* 1.60*  --  1.68* 1.73* 2.34* 3.15* 3.72* 4.32*  CALCIUM 8.7*  < >  --  8.4* 7.6*  --  8.3* 8.6* 8.9 8.8* 8.6* 8.5*  MG 1.8  --  1.6* 1.7 1.4* 1.4*  --   --   --   --   --   --   PHOS 2.4*  < >  --  2.3* 2.1*  --  11.8*  --  3.4 3.4  --  4.0  < > = values in this interval not displayed.  Liver Function Tests:  Recent Labs Lab 01/25/17 0458  01/26/17 1302 01/26/17 1752 01/27/17 0004 01/28/17 0515 01/29/17 1311  AST 39  --   --   --   --   --   --   ALT 19  --   --   --   --   --   --   ALKPHOS 79  --   --   --   --   --   --   BILITOT 0.8  --   --   --   --   --   --  PROT 5.7*  --   --   --   --   --   --   ALBUMIN 2.1*  < > 2.8* 2.2* 2.4* 2.4* 2.5*  < > = values in this interval not displayed. No results for input(s): LIPASE, AMYLASE in the last 168 hours. No results for input(s): AMMONIA in the last 168 hours.  CBC:  Recent Labs Lab 01/25/17 0458 01/26/17 0422 01/27/17 0600 01/28/17 0553 01/31/17 0454  WBC 11.1* 12.7* 11.6* 11.3* 12.2*  HGB 9.8* 8.6* 7.9* 8.7* 8.8*  HCT 29.6* 26.8* 24.6* 26.0* 26.8*  MCV 102.4* 104.2* 105.1* 104.7* 105.4*  PLT 77* 61* 50* 46* 70*    Cardiac Enzymes: No results for input(s): CKTOTAL, CKMB, CKMBINDEX, TROPONINI in the last 168 hours.  BNP: Invalid input(s): POCBNP  CBG:  Recent Labs Lab 01/30/17 0723 01/30/17 1110 01/30/17 1628 01/30/17 2128 01/31/17 0730  GLUCAP 110* 97 162* 219* 87    Microbiology: Results for orders placed or performed during the hospital encounter of 2017-07-01  Blood Culture (routine x 2)     Status: None   Collection Time: 2017-07-01 10:27 AM  Result Value Ref Range Status   Specimen Description BLOOD LEFT ARM  Final   Special Requests BOTTLES DRAWN AEROBIC AND ANAEROBIC ANA2ML AER3ML  Final   Culture NO GROWTH 5 DAYS  Final   Report Status 01/26/2017 FINAL  Final  C difficile quick scan w PCR reflex     Status: None   Collection Time: 2017-07-01 11:28 AM   Result Value Ref Range Status   C Diff antigen NEGATIVE NEGATIVE Final   C Diff toxin NEGATIVE NEGATIVE Final   C Diff interpretation No C. difficile detected.  Final  Gastrointestinal Panel by PCR , Stool     Status: None   Collection Time: 2017-07-01 11:28 AM  Result Value Ref Range Status   Campylobacter species NOT DETECTED NOT DETECTED Final   Plesimonas shigelloides NOT DETECTED NOT DETECTED Final   Salmonella species NOT DETECTED NOT DETECTED Final   Yersinia enterocolitica NOT DETECTED NOT DETECTED Final   Vibrio species NOT DETECTED NOT DETECTED Final   Vibrio cholerae NOT DETECTED NOT DETECTED Final   Enteroaggregative E coli (EAEC) NOT DETECTED NOT DETECTED Final   Enteropathogenic E coli (EPEC) NOT DETECTED NOT DETECTED Final   Enterotoxigenic E coli (ETEC) NOT DETECTED NOT DETECTED Final   Shiga like toxin producing E coli (STEC) NOT DETECTED NOT DETECTED Final   Shigella/Enteroinvasive E coli (EIEC) NOT DETECTED NOT DETECTED Final   Cryptosporidium NOT DETECTED NOT DETECTED Final   Cyclospora cayetanensis NOT DETECTED NOT DETECTED Final   Entamoeba histolytica NOT DETECTED NOT DETECTED Final   Giardia lamblia NOT DETECTED NOT DETECTED Final   Adenovirus F40/41 NOT DETECTED NOT DETECTED Final   Astrovirus NOT DETECTED NOT DETECTED Final   Norovirus GI/GII NOT DETECTED NOT DETECTED Final   Rotavirus A NOT DETECTED NOT DETECTED Final   Sapovirus (I, II, IV, and V) NOT DETECTED NOT DETECTED Final  Blood Culture (routine x 2)     Status: None   Collection Time: 2017-07-01 11:28 AM  Result Value Ref Range Status   Specimen Description BLOOD RIGHT PICC  Final   Special Requests BOTTLES DRAWN AEROBIC AND ANAEROBIC ANA7ML AER9ML  Final   Culture NO GROWTH 5 DAYS  Final   Report Status 01/26/2017 FINAL  Final  Urine culture     Status: None   Collection Time: 2017-07-01 12:10 PM  Result Value Ref Range  Status   Specimen Description URINE, RANDOM  Final   Special Requests NONE   Final   Culture   Final    NO GROWTH Performed at [facility], [address], [place], [place] [address]    Report Status 01/22/2017 FINAL  Final  MRSA PCR Screening     Status: None   Collection Time: 02/18/2017  6:07 PM  Result Value Ref Range Status   MRSA by PCR NEGATIVE NEGATIVE Final    Comment:        The GeneXpert MRSA Assay (FDA approved for NASAL specimens only), is one component of a comprehensive MRSA colonization surveillance program. It is not intended to diagnose MRSA infection nor to guide or monitor treatment for MRSA infections.     Coagulation Studies: No results for input(s): LABPROT, INR in the last 72 hours.  Urinalysis: No results for input(s): COLORURINE, LABSPEC, PHURINE, GLUCOSEU, HGBUR, BILIRUBINUR, KETONESUR, PROTEINUR, UROBILINOGEN, NITRITE, LEUKOCYTESUR in the last 72 hours.  Invalid input(s): APPERANCEUR    Imaging: No results found.   Medications:   . octreotide  (SANDOSTATIN)    IV infusion Stopped (01/31/17 0908)   . cefTRIAXone (ROCEPHIN) IVPB 2 gram/50 mL D5W (Pyxis)  2 g Intravenous Q24H  . feeding supplement (NEPRO CARB STEADY)  237 mL Oral TID BM  . feeding supplement (PRO-STAT SUGAR FREE 64)  30 mL Oral BID  . folic acid  1 mg Oral Daily  . hydrocerin   Topical BID  . insulin aspart  0-15 Units Subcutaneous TID WC  . insulin aspart  0-5 Units Subcutaneous QHS  . insulin glargine  10 Units Subcutaneous QHS  . mouth rinse  15 mL Mouth Rinse BID  . metroNIDAZOLE  500 mg Oral Q8H  . midodrine  10 mg Oral TID WC  . pantoprazole  40 mg Oral BID AC  . sodium chloride flush  10-40 mL Intracatheter Q12H  . sucralfate  1 g Oral TID WC & HS   sodium chloride, ipratropium-albuterol, ondansetron (ZOFRAN) IV, sodium chloride flush  Assessment/ Plan:  Mr. [name] is a 56 y.o. white male with decubitus ulcer, hypertension, congestive heart failure, atrial fibrillation, Alcoholic cirrhosis, depression who was admitted to  [facility] from 1/14 to 2/2 for sepsis. He was discharged with metronidazole and ceftriaxone for a total of six weeks. Patient was admitted to [facility] on 02/18/2017 with sepsis.   1. Acute renal failure:  Oliguric. Off vasopressors. Baseline creatinine of 0.86 on 01/11/17 CRRT on 2/9-2/15. -  Overall renal function remains quite low. Urine output was only 325 cc over the preceding 24 hours. Therefore we elected to perform hemodialysis today. Patient seen and evaluated during the dialysis treatment. Blood pressure remains quite low and we will plan to continue midodrine with dialysis sessions.  2.  Alcoholic cirrhosis, Anasarca/Edema - 3rd spacing from low albumin,   -Consider use of albumin with dialysis sessions.  3. Secondary hyperparathyroidism.  Phosphorus 4.0 and acceptable today.  4. Hypotension. Likely secondary to liver disease. Continue midodrine 10 mg by mouth 3 times a day.     LOS: 10 [name] 2/19/201811:45 AM

## 2017-01-31 NOTE — Progress Notes (Signed)
HD initiated via R HD cath without issue. Patient currently has no complaints.

## 2017-01-31 NOTE — Progress Notes (Signed)
Post hd assessment 

## 2017-01-31 NOTE — Progress Notes (Signed)
   01/25/17 1200  Pressure Injury 12/27/16 Unstageable - Full thickness tissue loss in which the base of the ulcer is covered by slough (yellow, tan, gray, green or brown) and/or eschar (tan, brown or black) in the wound bed. necrotic  Date First Assessed/Time First Assessed: 12/27/16 1700   Location: Buttocks  Location Orientation: Right;Lower  Staging: Unstageable - Full thickness tissue loss in which the base of the ulcer is covered by slough (yellow, tan, gray, green or brown) a...  Dressing Type Foam (NS kerlix->dry gauze->pink foam)  Dressing Changed  Dressing Change Frequency Twice a day  State of Healing Non-healing  Site / Wound Assessment Red;Granulation tissue  % Wound base Red or Granulating 60%  % Wound base Yellow/Fibrinous Exudate 10%  % Wound base Black/Eschar 30%  % Wound base Other/Granulation Tissue (Comment) 5%  Peri-wound Assessment Maceration;Pink  Wound Length (cm) 8 cm  Wound Width (cm) 4 cm  Wound Depth (cm) 4 cm  Tunneling (cm) 0  Undermining (cm) 0  Margins Unattached edges (unapproximated)  Drainage Amount Minimal  Drainage Description Serosanguineous  Treatment Cleansed (cleansed with NS->NS kerlix->dry gauze->pink foam)

## 2017-02-01 LAB — BASIC METABOLIC PANEL
ANION GAP: 4 — AB (ref 5–15)
BUN: 29 mg/dL — ABNORMAL HIGH (ref 6–20)
CALCIUM: 7.9 mg/dL — AB (ref 8.9–10.3)
CO2: 27 mmol/L (ref 22–32)
Chloride: 104 mmol/L (ref 101–111)
Creatinine, Ser: 3.25 mg/dL — ABNORMAL HIGH (ref 0.61–1.24)
GFR, EST AFRICAN AMERICAN: 23 mL/min — AB (ref >60)
GFR, EST NON AFRICAN AMERICAN: 20 mL/min — AB (ref >60)
GLUCOSE: 85 mg/dL (ref 65–99)
Potassium: 3.5 mmol/L (ref 3.5–5.1)
SODIUM: 135 mmol/L (ref 135–145)

## 2017-02-01 LAB — GLUCOSE, CAPILLARY
GLUCOSE-CAPILLARY: 69 mg/dL (ref 65–99)
GLUCOSE-CAPILLARY: 132 mg/dL — AB (ref 65–99)
Glucose-Capillary: 115 mg/dL — ABNORMAL HIGH (ref 65–99)
Glucose-Capillary: 95 mg/dL (ref 65–99)
Glucose-Capillary: 148 mg/dL — ABNORMAL HIGH (ref 65–99)

## 2017-02-01 LAB — CBC
HCT: 28 % — ABNORMAL LOW (ref 40.0–52.0)
Hemoglobin: 9.2 g/dL — ABNORMAL LOW (ref 13.0–18.0)
MCH: 35.3 pg — ABNORMAL HIGH (ref 26.0–34.0)
MCHC: 33 g/dL (ref 32.0–36.0)
MCV: 107.1 fL — ABNORMAL HIGH (ref 80.0–100.0)
Platelets: 63 10*3/uL — ABNORMAL LOW (ref 150–440)
RBC: 2.61 MIL/uL — AB (ref 4.40–5.90)
RDW: 21 % — ABNORMAL HIGH (ref 11.5–14.5)
WBC: 9.6 10*3/uL (ref 3.8–10.6)

## 2017-02-01 LAB — HEPATITIS B SURFACE ANTIGEN: HEP B S AG: NEGATIVE

## 2017-02-01 MED ORDER — INSULIN ASPART 100 UNIT/ML ~~LOC~~ SOLN
0.0000 [IU] | Freq: Three times a day (TID) | SUBCUTANEOUS | Status: DC
Start: 2017-02-01 — End: 2017-02-09
  Administered 2017-02-02: 2 [IU] via SUBCUTANEOUS
  Administered 2017-02-03 – 2017-02-04 (×3): 1 [IU] via SUBCUTANEOUS
  Administered 2017-02-04 – 2017-02-05 (×2): 2 [IU] via SUBCUTANEOUS
  Administered 2017-02-05 – 2017-02-07 (×4): 1 [IU] via SUBCUTANEOUS
  Administered 2017-02-07 – 2017-02-08 (×2): 2 [IU] via SUBCUTANEOUS
  Administered 2017-02-08 (×2): 1 [IU] via SUBCUTANEOUS
  Filled 2017-02-01: qty 1
  Filled 2017-02-01: qty 2
  Filled 2017-02-01 (×2): qty 1
  Filled 2017-02-01: qty 2
  Filled 2017-02-01: qty 1
  Filled 2017-02-01 (×2): qty 2
  Filled 2017-02-01 (×2): qty 1
  Filled 2017-02-01 (×2): qty 2
  Filled 2017-02-01 (×2): qty 1

## 2017-02-01 MED ORDER — PSYLLIUM 95 % PO PACK
1.0000 | PACK | Freq: Every day | ORAL | Status: DC
  Filled 2017-02-01 (×2): qty 1

## 2017-02-01 MED ORDER — ENSURE ENLIVE PO LIQD
237.0000 mL | Freq: Three times a day (TID) | ORAL | Status: DC
  Administered 2017-02-01 – 2017-02-08 (×13): 237 mL via ORAL

## 2017-02-01 NOTE — Progress Notes (Signed)
Physical Therapy Treatment Patient Details Name: [name] MRN: [id number] DOB: 1961-02-28 Today's Date: 02/01/2017    History of Present Illness presented to ER from STR secondary to hypotension, nausea/vomiting; admitted with septic shock secondary to infected sacral decubitus (stage IV), renal failure. Initially requiring high-dose pressors (now weaned) and CRRT (2/9-2/15) now on intermittent HD via R IJ temp dialysis access.    PT Comments    Pt agrees to supine exercises in left sidelying. Pt declines offer and encouragement to reposition in supine for exercises to allow for full ROM and exercise of LE's  "I'll do my arms and that's it"  Participated in self limiting exercises as described below.  Pt refuses EOB activity.    Reviewed HEP with pt.  Pt stated he does HEP during the day but has poor recall of what exercises he does except for pushing his feet into footboard of his bed.  Reviewed exercises.  Stated he has difficulty with LE exercises without assist.  Will continue as appropriate.   Follow Up Recommendations  LTACH     Equipment Recommendations       Recommendations for Other Services       Precautions / Restrictions Precautions Precautions: Fall Precaution Comments: Sacral decub, R IJ temp dialysis catheter; HOB <30 degrees per wound care Restrictions Weight Bearing Restrictions: No    Mobility  Bed Mobility               General bed mobility comments: refused supine/sit or OOB attempts  Transfers                 General transfer comment: refused supine/sit or OOB attempts  Ambulation/Gait                 Stairs            Wheelchair Mobility    Modified Rankin (Stroke Patients Only)       Balance                                    Cognition Arousal/Alertness: Awake/alert Behavior During Therapy: WFL for tasks assessed/performed Overall Cognitive Status: Within Functional Limits for tasks  assessed                      Exercises Other Exercises Other Exercises: Particiated in minimal RUE exercises for shoulder, elbow, wrist and hand and limited LUE wrist, hand, elbow 2 x 10    General Comments        Pertinent Vitals/Pain Pain Assessment: 0-10 Pain Score: 4  Pain Location: generalized soreness, soreness in sacral area Pain Descriptors / Indicators: Aching;Sore Pain Intervention(s): Limited activity within patient's tolerance    Home Living                      Prior Function            PT Goals (current goals can now be found in the care plan section) Progress towards PT goals: Not progressing toward goals - comment    Frequency    Min 2X/week      PT Plan Current plan remains appropriate    Co-evaluation             End of Session Equipment Utilized During Treatment: Oxygen Activity Tolerance: Patient limited by fatigue;Other (comment) Patient left: with call bell/phone within reach;in bed;with bed alarm set  Time: 1057-1105 PT Time Calculation (min) (ACUTE ONLY): 8 min  Charges:  $Therapeutic Exercise: 8-22 mins                    G Codes:       Danielle DessSarah Maycie Luera, PTA 02/01/17, 11:14 AM

## 2017-02-01 NOTE — Progress Notes (Signed)
Inpatient Diabetes Program Recommendations  AACE/ADA: New Consensus Statement on Inpatient Glycemic Control (2015)  Target Ranges:  Prepandial:   less than 140 mg/dL      Peak postprandial:   less than 180 mg/dL (1-2 hours)      Critically ill patients:  140 - 180 mg/dL  Results for [name], [name] (MRN [id number]) as of 02/01/2017 11:07  Ref. Range 01/31/2017 07:30 01/31/2017 15:08 01/31/2017 16:41 01/31/2017 21:31 02/01/2017 07:25 02/01/2017 08:17  Glucose-Capillary Latest Ref Range: 65 - 99 mg/dL 87 76 409108 (H) 811106 (H) 69 132 (H)    Review of Glycemic Control  Diabetes history: NO Outpatient Diabetes medications: NA Current orders for Inpatient glycemic control: Lantus 10 units QHS, Novolog 0-15 units TID with meals, Novolog 0-5 units QHS  Inpatient Diabetes Program Recommendations:  Insulin - Basal: Noted steroids have been discontinued and patient does not have a history of DM. Fasting glucose 69 mg/dl this morning. Please discontinue Lantus at this time and just use Novolog correction if needed. Correction (SSI): Please decrease Novolog correction to sensitive scale (0-9 units) ACHS.  Thanks, Orlando PennerMarie Kiel Cockerell, RN, MSN, CDE Diabetes Coordinator Inpatient Diabetes Program [phone] (Team Pager from 8am to 5pm)

## 2017-02-01 NOTE — Progress Notes (Signed)
[facility]  ROUNDING NOTE   Subjective:  Patient continues to have oliguria at this point in time. He did have dialysis yesterday.  We will plan for dialysis again if oliguria persists tomorrow.    Objective:  Vital signs in last 24 hours:  Temp:  [97.5 F (36.4 C)-98.3 F (36.8 C)] 98 F (36.7 C) (02/20 0751) Pulse Rate:  [68-94] 87 (02/20 0751) Resp:  [10-18] 18 (02/20 0751) BP: (79-101)/(49-80) 101/65 (02/20 0751) SpO2:  [93 %-98 %] 93 % (02/20 0751) Weight:  [141.3 kg (311 lb 9.6 oz)-143.1 kg (315 lb 7.7 oz)] 141.3 kg (311 lb 9.6 oz) (02/20 0453)  Weight change: 0.434 kg (15.3 oz) Filed Weights   01/31/17 1012 01/31/17 1351 02/01/17 0453  Weight: (!) 143 kg (315 lb 4.1 oz) (!) 143.1 kg (315 lb 7.7 oz) (!) 141.3 kg (311 lb 9.6 oz)    Intake/Output: I/O last 3 completed shifts: In: 918 [P.O.:360; I.V.:558] Out: 108 [Urine:350]   Intake/Output this shift:  No intake/output data recorded.  Physical Exam: General: No acute distress  Head: Normocephalic, atraumatic. Moist oral mucosal membranes  Eyes: Anicteric  Neck: Supple  Lungs:  Clear to auscultation  Heart: Regular rate and rhythm  Abdomen:  Soft, nontender, obese, distended   Extremities:  2+ dependent peripheral edema. Anasarca  Neurologic: Alert and oriented  Skin: Scattered bruising  Access: Right IJ temp HD catheter 2/9    Basic Metabolic Panel:  Recent Labs Lab 01/26/17 0422  01/26/17 1610 01/26/17 1302 01/26/17 1752 01/26/17 2245 01/27/17 0004  01/28/17 0515 01/29/17 1311 01/30/17 0702 01/31/17 1020 02/01/17 0311  NA 138  < >  --  135 135  --  135  < > 138 134* 134* 133* 135  K 4.0  < >  --  3.9 3.5  --  4.0  < > 4.2 4.0 3.9 3.5 3.5  CL 107  < >  --  107 108  --  100*  < > 108 104 104 104 104  CO2 27  < >  --  24 24  --  24  < > 27 24 23 24 27   GLUCOSE 152*  < >  --  228* 194*  --  340*  < > 138* 129* 130* 75 85  BUN 22*  < >  --  20 19  --  18  < > 22* 32* 40* 48* 29*   CREATININE 1.92*  < >  --  1.86* 1.60*  --  1.68*  < > 2.34* 3.15* 3.72* 4.32* 3.25*  CALCIUM 8.7*  < >  --  8.4* 7.6*  --  8.3*  < > 8.9 8.8* 8.6* 8.5* 7.9*  MG 1.8  --  1.6* 1.7 1.4* 1.4*  --   --   --   --   --   --   --   PHOS 2.4*  < >  --  2.3* 2.1*  --  11.8*  --  3.4 3.4  --  4.0  --   < > = values in this interval not displayed.  Liver Function Tests:  Recent Labs Lab 01/26/17 1302 01/26/17 1752 01/27/17 0004 01/28/17 0515 01/29/17 1311  ALBUMIN 2.8* 2.2* 2.4* 2.4* 2.5*   No results for input(s): LIPASE, AMYLASE in the last 168 hours. No results for input(s): AMMONIA in the last 168 hours.  CBC:  Recent Labs Lab 01/26/17 0422 01/27/17 0600 01/28/17 0553 01/31/17 0454 02/01/17 0311  WBC 12.7* 11.6* 11.3* 12.2*  9.6  HGB 8.6* 7.9* 8.7* 8.8* 9.2*  HCT 26.8* 24.6* 26.0* 26.8* 28.0*  MCV 104.2* 105.1* 104.7* 105.4* 107.1*  PLT 61* 50* 46* 70* 63*    Cardiac Enzymes: No results for input(s): CKTOTAL, CKMB, CKMBINDEX, TROPONINI in the last 168 hours.  BNP: Invalid input(s): POCBNP  CBG:  Recent Labs Lab 01/31/17 1641 01/31/17 2131 02/01/17 0725 02/01/17 0817 02/01/17 1111  GLUCAP 108* 106* 69 132* 115*    Microbiology: Results for orders placed or performed during the hospital encounter of 01/24/2017  Blood Culture (routine x 2)     Status: None   Collection Time: 02/02/2017 10:27 AM  Result Value Ref Range Status   Specimen Description BLOOD LEFT ARM  Final   Special Requests BOTTLES DRAWN AEROBIC AND ANAEROBIC ANA2ML AER3ML  Final   Culture NO GROWTH 5 DAYS  Final   Report Status 01/26/2017 FINAL  Final  C difficile quick scan w PCR reflex     Status: None   Collection Time: 01/25/2017 11:28 AM  Result Value Ref Range Status   C Diff antigen NEGATIVE NEGATIVE Final   C Diff toxin NEGATIVE NEGATIVE Final   C Diff interpretation No C. difficile detected.  Final  Gastrointestinal Panel by PCR , Stool     Status: None   Collection Time: 01/20/2017 11:28  AM  Result Value Ref Range Status   Campylobacter species NOT DETECTED NOT DETECTED Final   Plesimonas shigelloides NOT DETECTED NOT DETECTED Final   Salmonella species NOT DETECTED NOT DETECTED Final   Yersinia enterocolitica NOT DETECTED NOT DETECTED Final   Vibrio species NOT DETECTED NOT DETECTED Final   Vibrio cholerae NOT DETECTED NOT DETECTED Final   Enteroaggregative E coli (EAEC) NOT DETECTED NOT DETECTED Final   Enteropathogenic E coli (EPEC) NOT DETECTED NOT DETECTED Final   Enterotoxigenic E coli (ETEC) NOT DETECTED NOT DETECTED Final   Shiga like toxin producing E coli (STEC) NOT DETECTED NOT DETECTED Final   Shigella/Enteroinvasive E coli (EIEC) NOT DETECTED NOT DETECTED Final   Cryptosporidium NOT DETECTED NOT DETECTED Final   Cyclospora cayetanensis NOT DETECTED NOT DETECTED Final   Entamoeba histolytica NOT DETECTED NOT DETECTED Final   Giardia lamblia NOT DETECTED NOT DETECTED Final   Adenovirus F40/41 NOT DETECTED NOT DETECTED Final   Astrovirus NOT DETECTED NOT DETECTED Final   Norovirus GI/GII NOT DETECTED NOT DETECTED Final   Rotavirus A NOT DETECTED NOT DETECTED Final   Sapovirus (I, II, IV, and V) NOT DETECTED NOT DETECTED Final  Blood Culture (routine x 2)     Status: None   Collection Time: 01/16/2017 11:28 AM  Result Value Ref Range Status   Specimen Description BLOOD RIGHT PICC  Final   Special Requests BOTTLES DRAWN AEROBIC AND ANAEROBIC ANA7ML AER9ML  Final   Culture NO GROWTH 5 DAYS  Final   Report Status 01/26/2017 FINAL  Final  Urine culture     Status: None   Collection Time: 02/02/2017 12:10 PM  Result Value Ref Range Status   Specimen Description URINE, RANDOM  Final   Special Requests NONE  Final   Culture   Final    NO GROWTH Performed at [facility], [address] [place], [place] [address]    Report Status 01/22/2017 FINAL  Final  MRSA PCR Screening     Status: None   Collection Time: 01/20/2017  6:07 PM  Result Value Ref Range  Status   MRSA by PCR NEGATIVE NEGATIVE Final  Comment:        The GeneXpert MRSA Assay (FDA approved for NASAL specimens only), is one component of a comprehensive MRSA colonization surveillance program. It is not intended to diagnose MRSA infection nor to guide or monitor treatment for MRSA infections.     Coagulation Studies: No results for input(s): LABPROT, INR in the last 72 hours.  Urinalysis: No results for input(s): COLORURINE, LABSPEC, PHURINE, GLUCOSEU, HGBUR, BILIRUBINUR, KETONESUR, PROTEINUR, UROBILINOGEN, NITRITE, LEUKOCYTESUR in the last 72 hours.  Invalid input(s): APPERANCEUR    Imaging: No results found.   Medications:    . cefTRIAXone (ROCEPHIN) IVPB 2 gram/50 mL D5W (Pyxis)  2 g Intravenous Q24H  . feeding supplement (NEPRO CARB STEADY)  237 mL Oral TID BM  . feeding supplement (PRO-STAT SUGAR FREE 64)  30 mL Oral BID  . folic acid  1 mg Oral Daily  . hydrocerin   Topical BID  . insulin aspart  0-9 Units Subcutaneous TID WC  . mouth rinse  15 mL Mouth Rinse BID  . metroNIDAZOLE  500 mg Oral Q8H  . midodrine  10 mg Oral TID WC  . pantoprazole  40 mg Oral BID AC  . sodium chloride flush  10-40 mL Intracatheter Q12H  . sucralfate  1 g Oral TID WC & HS   sodium chloride, ipratropium-albuterol, ondansetron (ZOFRAN) IV, sodium chloride flush  Assessment/ Plan:  Mr. KAYLEM GIDNEY is a 56 y.o. white male with decubitus ulcer, hypertension, congestive heart failure, atrial fibrillation, Alcoholic cirrhosis, depression who was admitted to [facility] from 1/14 to 2/2 for sepsis. He was discharged with metronidazole and ceftriaxone for a total of six weeks. Patient was admitted to [facility] on 01/27/2017 with sepsis.   1. Acute renal failure:  Oliguric. Off vasopressors. Baseline creatinine of 0.86 on 01/11/17 CRRT on 2/9-2/15. -  Unfortunately oliguria persists at this time. Urine output was only 150 cc over the preceding 24 hours. If this persists into tomorrow we  will likely need to consider an additional dialysis treatment. We may need to begin looking for an outpatient dialysis center under the diagnosis of acute renal failure for the patient.  2.  Alcoholic cirrhosis, Anasarca/Edema - 3rd spacing from low albumin,   -Consider use of albumin with dialysis sessions.  3. Secondary hyperparathyroidism.  Recheck phosphorus with dialysis tomorrow.  4. Hypotension. Likely secondary to liver disease. Blood pressure 101/65 today. Continue midodrine 10 mg by mouth 3 times a day.    LOS: 11 Joandy Burget 2/20/201812:05 PM

## 2017-02-01 NOTE — Care Management (Signed)
LTACH auth pending

## 2017-02-01 NOTE — Plan of Care (Signed)
Problem: Skin Integrity: Goal: Risk for impaired skin integrity will decrease Outcome: Progressing Patient is having q shift dressing changes for wound care.  Problem: Activity: Goal: Risk for activity intolerance will decrease Outcome: Not Progressing Patient has weakness.

## 2017-02-01 NOTE — Progress Notes (Signed)
Nutrition Follow-up  DOCUMENTATION CODES:   Severe malnutrition in context of acute illness/injury, Obesity unspecified  INTERVENTION:  Will discontinue Nepro TID.  Provide Ensure Enlive po TID, each supplement provides 350 kcal and 20 grams of protein.  Continue Pro-Stat 30 ml BID, each supplement provides 100 kcal and 15 grams of protein.  Ordered yogurt BID as snack between meal for patient.  Encouraged patient to order a sandwich at dialysis if he misses his regular meal while there.  Consider use of psyllium (metamucil) once daily if medically appropriate in setting of frequent bowel movements affecting patient's PO intake.  NUTRITION DIAGNOSIS:   Malnutrition related to acute illness as evidenced by energy intake < or equal to 50% for > or equal to 5 days, severe fluid accumulation, mild depletion of body fat, percent weight loss, mild depletion of muscle mass.  Ongoing.  GOAL:   Patient will meet greater than or equal to 90% of their needs  Not met.   MONITOR:   PO intake, Supplement acceptance, Labs, Weight trends  REASON FOR ASSESSMENT:   Rounds    ASSESSMENT:   56 yo male admitted with septic shock secondary to infected decubitus ulcer, acute respiratory failure due to pulmonary congestion, ARF with oligruia  Spoke with patient at bedside. He reports he has not been eating because of his bowel movements and loose stools. Discussed with patient the importance of meeting calorie and protein needs to prevent further weight loss and help heal wounds. Patient is amenable to eating yogurt BID between meals. As Nepro high in fat will switch patient to Ensure Enlive to see if this helps. Will also discuss possibility of adding psyllium fiber (metamucil) with MD in setting of loose stools if medically appropriate.  Meal Completion: 0% of breakfast this morning. Patient reports eating part of his roast beef, potato, and green beans for lunch yesterday, but he only had  bites of gelatin for dinner last night. He reports drinking 2 Nepro and 2 Pro-Stat yesterday. In the past 24 hours patient has had approximately 1379 kcal (55% minimum estimated kcal needs) and 79 grams of protein (57% minimum estimated protein needs).   Medications reviewed and include: ceftriaxone 2 grams IV in 50 ml D5, folic acid 1 mg daily, Novolog sliding scale TID with meals, Flagyl 500 mg Q8hrs, pantoprazole, Carafate.  Labs reviewed: CBG 69-132 past 24 hrs, BUN 29 (trending down), Creatinine 3.25 (trending down). Potassium WNL today and Phosphorus WNL on 2/19.   Per chart patient had 5 bowl movements yesterday. Last stool charted was type 6.   UOP 150 ml yesterday. Patient had HD 2/19 and per chart plan is for him to continue with dialysis if oliguria persists.   Weight trend: 141.3 kg on 2/20 (+9.8 kg from admission - likely in setting of fluid).   Discussed with RN.   Diet Order:  Diet regular Room service appropriate? Yes; Fluid consistency: Thin; Fluid restriction: 1200 mL Fluid  Skin:  Wound (see comment) (unstageable, full thickeness tissue loss on buttock)  Last BM:  02/01/2017  Height:   Ht Readings from Last 1 Encounters:  01/28/17 6' 4"  (1.93 m)    Weight:   Wt Readings from Last 1 Encounters:  02/01/17 (!) 311 lb 9.6 oz (141.3 kg)    Ideal Body Weight:  91.8 kg  BMI:  Body mass index is 37.93 kg/m.  Estimated Nutritional Needs:   Kcal:  2500-2800 kcals  Protein:  138-180 g  Fluid:  1000 mL plus  UOP or per MD  EDUCATION NEEDS:   No education needs identified at this time  Willey Blade, MS, RD, LDN Pager: [phone] After Hours Pager: [phone]

## 2017-02-01 NOTE — Plan of Care (Signed)
Problem: Skin Integrity: Goal: Risk for impaired skin integrity will decrease Outcome: Progressing Patient cooperating with position changes for the most part.  Ref[facility] [place] [facility] at this time.

## 2017-02-01 NOTE — Progress Notes (Signed)
[facility] [place] [facility]   PATIENT NAME: [name]    MR#:  [id number]  DATE OF BIRTH:  Jul 03, 1961  SUBJECTIVE:   No acute events overnight. Patient is doing well.  REVIEW OF SYSTEMS:    Review of Systems  Constitutional: Negative.  Negative for chills, fever and malaise/fatigue.  HENT: Negative.  Negative for ear discharge, ear pain, hearing loss, nosebleeds and sore throat.   Eyes: Negative.  Negative for blurred vision and pain.  Respiratory: Negative.  Negative for cough, hemoptysis, shortness of breath and wheezing.   Cardiovascular: Negative.  Negative for chest pain, palpitations and leg swelling.  Gastrointestinal: Negative.  Negative for abdominal pain, blood in stool, diarrhea, nausea and vomiting.  Genitourinary: Negative.  Negative for dysuria.  Musculoskeletal: Negative.  Negative for back pain.  Skin: Negative.   Neurological: Negative for dizziness, tremors, speech change, focal weakness, seizures and headaches.  Endo/Heme/Allergies: Negative.  Does not bruise/bleed easily.  Psychiatric/Behavioral: Negative.  Negative for depression, hallucinations and suicidal ideas.    Tolerating Diet: yes      DRUG ALLERGIES:   Allergies  Allergen Reactions  . Clindamycin/Lincomycin Diarrhea    VITALS:  Blood pressure 101/65, pulse 87, temperature 98 F (36.7 C), temperature source Oral, resp. rate 18, height 6\' 4"  (1.93 m), weight (!) 141.3 kg (311 lb 9.6 oz), SpO2 93 %.  PHYSICAL EXAMINATION:   Physical Exam  Constitutional: He is oriented to person, place, and time and well-developed, well-nourished, and in no distress. No distress.  HENT:  Head: Normocephalic.  Right IJ  Eyes: No scleral icterus.  Neck: Normal range of motion. Neck supple. No JVD present. No tracheal deviation present.  Cardiovascular: Normal rate, regular rhythm and normal heart sounds.  Exam reveals no gallop and no friction rub.   No murmur  heard. Pulmonary/Chest: Effort normal and breath sounds normal. No respiratory distress. He has no wheezes. He has no rales. He exhibits no tenderness.  Abdominal: Soft. Bowel sounds are normal. He exhibits no distension and no mass. There is no tenderness. There is no rebound and no guarding.  Musculoskeletal: Normal range of motion. He exhibits edema.  Neurological: He is alert and oriented to person, place, and time.  Skin: Skin is warm. No rash noted. No erythema.  Psychiatric: Affect and judgment normal.      LABORATORY PANEL:   CBC  Recent Labs Lab 02/01/17 0311  WBC 9.6  HGB 9.2*  HCT 28.0*  PLT 63*   ------------------------------------------------------------------------------------------------------------------  Chemistries   Recent Labs Lab 01/26/17 2245  02/01/17 0311  NA  --   < > 135  K  --   < > 3.5  CL  --   < > 104  CO2  --   < > 27  GLUCOSE  --   < > 85  BUN  --   < > 29*  CREATININE  --   < > 3.25*  CALCIUM  --   < > 7.9*  MG 1.4*  --   --   < > = values in this interval not displayed. ------------------------------------------------------------------------------------------------------------------  Cardiac Enzymes No results for input(s): TROPONINI in the last 168 hours. ------------------------------------------------------------------------------------------------------------------  RADIOLOGY:  No results found.   ASSESSMENT AND PLAN:    56 year old male with EtOH related liver cirrhosis and portal gastropathy who was recently discharged for sacral decubitus presents with septic shock due to infected sacral decubitus and acute kidney failure due to sepsis.   1. Acute  hypoxic respiratory failure in the setting of pulmonary vascular congestion due to renal failure Resolved  2. Acute kidney failure with anuria received CRT from February 9 - 15th. Patient is receiving dialysis today due to increasing creatinine and poor urine output   Monitor urine output and BMP  3. EtOH liver cirrhosis with anasarca and portal hypertensive gastropathy with slow GI bleed: Continue IV PPI and Carafate.  Plan to discontinue octreotide and monitor hemoglobin . Plan discussed with gastroenterology  4. Chronic thrombocytopenia due to problem #3  5. Septic shock from infected sacral decubitus: Continue ceftriaxone and Flagyl as per ID recommendations    Management plans discussed with the patient and he is in agreement.  CODE STATUS: LIMITED  TOTAL TIME TAKING CARE OF THIS PATIENT: 24minutes.     POSSIBLE D/C LTAC referral sent by social worker, DEPENDING ON CLINICAL CONDITION.   Sola Margolis M.D on 02/01/2017 at 10:58 AM  Between 7am to 6pm - Pager - [phone] After 6pm go to [email or web address] - password [email or web address]  [facility]  Office  [phone]  CC: Primary care physician; Corky DownsMASOUD,[name], MD  Note: This dictation was prepared with Dragon dictation along with smaller phrase technology. Any transcriptional errors that result from this process are unintentional.

## 2017-02-02 LAB — GLUCOSE, CAPILLARY
GLUCOSE-CAPILLARY: 115 mg/dL — AB (ref 65–99)
Glucose-Capillary: 153 mg/dL — ABNORMAL HIGH (ref 65–99)

## 2017-02-02 LAB — PHOSPHORUS: PHOSPHORUS: 3.9 mg/dL (ref 2.5–4.6)

## 2017-02-02 NOTE — Care Management (Signed)
Notified from [facility]Erika at [facility] that patient was denied LTACH.  Awaiting formal denial letter for MD to complete peer to peer

## 2017-02-02 NOTE — Progress Notes (Signed)
HD completed without issue. Total UF 1.4L. BP drop during last 30 minutes of treatment. 12 pm midodrine dose was given. [facility]. Patient currently has no complaints.

## 2017-02-02 NOTE — Plan of Care (Signed)
Problem: Pain Managment: Goal: General experience of comfort will improve Outcome: Progressing Patient has had no complaints of pain throughout shift.

## 2017-02-02 NOTE — Progress Notes (Signed)
HD initiated without issue. No heparin treatment. Lab sent per order. Patient currently lying on Left side to decrease pressure on sacral wound. Notified patient to let RN know when wants to change positions so to avoid tangling of lines and pulling on catheter. Patient currently has no complaints.

## 2017-02-02 NOTE — Progress Notes (Signed)
Pre hd assessment  

## 2017-02-02 NOTE — Progress Notes (Signed)
Post HD assessment unchanged  

## 2017-02-02 NOTE — Progress Notes (Signed)
Patient slightly hypotensive; Dr. Juliene PinaMody present in room when VS taken.  Midodrine given as scheduled.  No new orders.

## 2017-02-02 NOTE — Progress Notes (Signed)
Pre HD  

## 2017-02-02 NOTE — Progress Notes (Signed)
[name] Mood MD [address] [place], [place] [address] Phone: [phone] Fax : [phone]  [name] is being followed for Melena Day   Subjective: Awake, denies any abdominal pain, no hematemesis, says he had a bowel movement but not sure of the color. Per nursing no concerns for bleed.  Objective: Vital signs in last 24 hours: Vitals:   02/02/17 0747 02/02/17 0915 02/02/17 0916 02/02/17 0930  BP: (!) 87/55 97/73 91/76  94/65  Pulse: 84 91 89 83  Resp: 18 16 16 15   Temp: 97.9 F (36.6 C) 98 F (36.7 C)    TempSrc: Oral Oral    SpO2: 90% 91% 92% 94%  Weight:  (!) 310 lb 13.6 oz (141 kg)    Height:       Weight change: -3 lb 12.1 oz (-1.704 kg)  Intake/Output Summary (Last 24 hours) at 02/02/17 0955 Last data filed at 02/02/17 1308  Gross per 24 hour  Intake              120 ml  Output               53 ml  Net               67 ml     Exam: Heart:: Regular rate and rhythm, S1S2 present or without murmur or extra heart sounds Lungs: normal, clear to auscultation and clear to auscultation and percussion Abdomen: soft, nontender, normal bowel sounds   Lab Results: CBC Latest Ref Rng & Units 02/01/2017 01/31/2017 01/28/2017  WBC 3.8 - 10.6 K/uL 9.6 12.2(H) 11.3(H)  Hemoglobin 13.0 - 18.0 g/dL 6.5(H) 8.4(O) 9.6(E)  Hematocrit 40.0 - 52.0 % 28.0(L) 26.8(L) 26.0(L)  Platelets 150 - 440 K/uL 63(L) 70(L) 46(L)    Micro Results: No results found for this or any previous visit (from the past 240 hour(s)). Studies/Results: No results found. Medications: I have reviewed the patient's current medications. Scheduled Meds: . cefTRIAXone (ROCEPHIN) IVPB 2 gram/50 mL D5W (Pyxis)  2 g Intravenous Q24H  . feeding supplement (ENSURE ENLIVE)  237 mL Oral TID BM  . feeding supplement (PRO-STAT SUGAR FREE 64)  30 mL Oral BID  . folic acid  1 mg Oral Daily  . hydrocerin   Topical BID  . insulin aspart  0-9 Units Subcutaneous TID WC  . mouth rinse  15 mL Mouth Rinse  BID  . metroNIDAZOLE  500 mg Oral Q8H  . midodrine  10 mg Oral TID WC  . pantoprazole  40 mg Oral BID AC  . psyllium  1 packet Oral Daily  . sodium chloride flush  10-40 mL Intracatheter Q12H  . sucralfate  1 g Oral TID WC & HS   Continuous Infusions: PRN Meds:.sodium chloride, ipratropium-albuterol, ondansetron (ZOFRAN) IV, sodium chloride flush   Assessment: Active Problems:   Sepsis (HCC)   Protein-calorie malnutrition, severe [name] is a 56 y.o. 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 Admitted with shock, sepsis, AKI, respiratory failure  I have been consulted for anemia and tarry black stool . It is very likely he is bleeding slowly from the portal hypertensive gastropathy . Presently off octreotide and Hb has been stable   CBC Latest Ref Rng & Units 02/01/2017 01/31/2017 01/28/2017  WBC 3.8 - 10.6 K/uL 9.6 12.2(H) 11.3(H)  Hemoglobin 13.0 - 18.0 g/dL 9.5(M) 8.4(X) 3.2(G)  Hematocrit 40.0 - 52.0 % 28.0(L) 26.8(L) 26.0(L)  Platelets 150 - 440 K/uL 63(L) 70(L) 46(L)    Plan: 1. Conside commencing on nadolol once his cardio respiratory status permits.  2. PPI 3. Hb from yesterday stable.  I will sign off.  Please call me if any further GI concerns or questions.  We would like to thank you for the opportunity to participate in the care of [name].    LOS: 12 days   [name] MoodKiran Jamarious [name] 02/02/2017, 9:55 AM

## 2017-02-02 NOTE — Progress Notes (Addendum)
[employer] - [place] at [facility]   PATIENT NAME: [name]    MR#:  [id number]  DATE OF BIRTH:  1961-03-29  SUBJECTIVE:   Patient still anuric  REVIEW OF SYSTEMS:    Review of Systems  Constitutional: Negative.  Negative for chills, fever and malaise/fatigue.  HENT: Negative.  Negative for ear discharge, ear pain, hearing loss, nosebleeds and sore throat.   Eyes: Negative.  Negative for blurred vision and pain.  Respiratory: Negative.  Negative for cough, hemoptysis, shortness of breath and wheezing.   Cardiovascular: Positive for leg swelling. Negative for chest pain and palpitations.  Gastrointestinal: Negative.  Negative for abdominal pain, blood in stool, diarrhea, nausea and vomiting.  Genitourinary: Negative for dysuria.       Not much UOP  Musculoskeletal: Negative.  Negative for back pain.  Skin: Negative.   Neurological: Negative for dizziness, tremors, speech change, focal weakness, seizures and headaches.  Endo/Heme/Allergies: Negative.  Does not bruise/bleed easily.  Psychiatric/Behavioral: Negative.  Negative for depression, hallucinations and suicidal ideas.    Tolerating Diet: yes      DRUG ALLERGIES:   Allergies  Allergen Reactions  . Clindamycin/Lincomycin Diarrhea    VITALS:  Blood pressure 94/65, pulse 83, temperature 98 F (36.7 C), temperature source Oral, resp. rate 15, height 6\' 4"  (1.93 m), weight (!) 141 kg (310 lb 13.6 oz), SpO2 94 %.  PHYSICAL EXAMINATION:   Physical Exam  Constitutional: He is oriented to person, place, and time and well-developed, well-nourished, and in no distress. No distress.  anasarca  HENT:  Head: Normocephalic.  Right IJ and PICC  Eyes: No scleral icterus.  Neck: Normal range of motion. Neck supple. No JVD present. No tracheal deviation present.  Cardiovascular: Normal rate, regular rhythm and normal heart sounds.  Exam reveals no gallop and no friction rub.   No murmur  heard. Pulmonary/Chest: Effort normal and breath sounds normal. No respiratory distress. He has no wheezes. He has no rales. He exhibits no tenderness.  Abdominal: Soft. Bowel sounds are normal. He exhibits no distension and no mass. There is no tenderness. There is no rebound and no guarding.  Musculoskeletal: Normal range of motion. He exhibits edema.  Neurological: He is alert and oriented to person, place, and time.  Skin: Skin is warm. No rash noted. No erythema.  Psychiatric: Affect and judgment normal.      LABORATORY PANEL:   CBC  Recent Labs Lab 02/01/17 0311  WBC 9.6  HGB 9.2*  HCT 28.0*  PLT 63*   ------------------------------------------------------------------------------------------------------------------  Chemistries   Recent Labs Lab 01/26/17 2245  02/01/17 0311  NA  --   < > 135  K  --   < > 3.5  CL  --   < > 104  CO2  --   < > 27  GLUCOSE  --   < > 85  BUN  --   < > 29*  CREATININE  --   < > 3.25*  CALCIUM  --   < > 7.9*  MG 1.4*  --   --   < > = values in this interval not displayed. ------------------------------------------------------------------------------------------------------------------  Cardiac Enzymes No results for input(s): TROPONINI in the last 168 hours. ------------------------------------------------------------------------------------------------------------------  RADIOLOGY:  No results found.   ASSESSMENT AND PLAN:    56 year old male with EtOH related liver cirrhosis and portal gastropathy who was recently discharged for sacral decubitus presents with septic shock due to infected sacral decubitus and acute kidney failure due  to sepsis.   1. Acute hypoxic respiratory failure in the setting of pulmonary vascular congestion due to renal failure Resolved  2. Acute kidney failure with anuria received CRT from February 9 - 15th. Patient will need HD today as he remains anuric.  Monitor urine output and BMP Appreciate  Nephrology consult.  3. EtOH liver cirrhosis with anasarca and portal hypertensive gastropathy with slow GI bleed: Continue  PPI and Carafate.  Octreotide stopped and monitor hemoglobin . Plan discussed with gastroenterology  4. Chronic thrombocytopenia due to problem #3  5. Septic shock from infected sacral decubitus (d/c from hospital on 2/9 with CTX and flagyl): Continue ceftriaxone and Flagyl as per ID recommendations  Shock has resolved  Management plans discussed with the patient and he is in agreement.  CODE STATUS: LIMITED  TOTAL TIME TAKING CARE OF THIS PATIENT: 24 minutes.     He has been denied LTAC I will cal for peer-peer review   Ashford Clouse M.D on 02/02/2017 at 9:37 AM  Between 7am to 6pm - Pager - [phone] After 6pm go to [email or web address] - password [facility]  [facility] [place] [facility]  Office  [phone]  CC: Primary care physician; Corky DownsMASOUD,[name], MD  Note: This dictation was prepared with Dragon dictation along with smaller phrase technology. Any transcriptional errors that result from this process are unintentional.

## 2017-02-02 NOTE — Progress Notes (Signed)
[facility]  ROUNDING NOTE   Subjective:  Patient completed hemodialysis today. He continues to have very little urine output. He was denied LTAC placement.  Objective:  Vital signs in last 24 hours:  Temp:  [97.5 F (36.4 C)-98.1 F (36.7 C)] 97.6 F (36.4 C) (02/21 1323) Pulse Rate:  [83-111] 95 (02/21 1323) Resp:  [13-20] 15 (02/21 1323) BP: (72-104)/(53-76) 92/65 (02/21 1323) SpO2:  [90 %-97 %] 92 % (02/21 1323) Weight:  [139.8 kg (308 lb 3.3 oz)-141.3 kg (311 lb 8 oz)] 139.8 kg (308 lb 3.3 oz) (02/21 1246)  Weight change: -1.704 kg (-3 lb 12.1 oz) Filed Weights   02/02/17 0506 02/02/17 0915 02/02/17 1246  Weight: (!) 141.3 kg (311 lb 8 oz) (!) 141 kg (310 lb 13.6 oz) (!) 139.8 kg (308 lb 3.3 oz)    Intake/Output: I/O last 3 completed shifts: In: 360 [P.O.:360] Out: 103 [Urine:100; Stool:3]   Intake/Output this shift:  Total I/O In: -  Out: 1351 [Other:1349; Stool:2]  Physical Exam: General: No acute distress  Head: Normocephalic, atraumatic. Moist oral mucosal membranes  Eyes: Anicteric  Neck: Supple  Lungs:  Clear to auscultation  Heart: Regular rate and rhythm  Abdomen:  Soft, nontender, obese, distended   Extremities:  2+ dependent peripheral edema. Anasarca  Neurologic: Alert and oriented  Skin: Scattered bruising  Access: Right IJ temp HD catheter 2/9    Basic Metabolic Panel:  Recent Labs Lab 01/26/17 1752 01/26/17 2245 01/27/17 0004  01/28/17 0515 01/29/17 1311 01/30/17 0702 01/31/17 1020 02/01/17 0311 02/02/17 0855  NA 135  --  135  < > 138 134* 134* 133* 135  --   K 3.5  --  4.0  < > 4.2 4.0 3.9 3.5 3.5  --   CL 108  --  100*  < > 108 104 104 104 104  --   CO2 24  --  24  < > 27 24 23 24 27   --   GLUCOSE 194*  --  340*  < > 138* 129* 130* 75 85  --   BUN 19  --  18  < > 22* 32* 40* 48* 29*  --   CREATININE 1.60*  --  1.68*  < > 2.34* 3.15* 3.72* 4.32* 3.25*  --   CALCIUM 7.6*  --  8.3*  < > 8.9 8.8* 8.6* 8.5* 7.9*  --    MG 1.4* 1.4*  --   --   --   --   --   --   --   --   PHOS 2.1*  --  11.8*  --  3.4 3.4  --  4.0  --  3.9  < > = values in this interval not displayed.  Liver Function Tests:  Recent Labs Lab 01/26/17 1752 01/27/17 0004 01/28/17 0515 01/29/17 1311  ALBUMIN 2.2* 2.4* 2.4* 2.5*   No results for input(s): LIPASE, AMYLASE in the last 168 hours. No results for input(s): AMMONIA in the last 168 hours.  CBC:  Recent Labs Lab 01/27/17 0600 01/28/17 0553 01/31/17 0454 02/01/17 0311  WBC 11.6* 11.3* 12.2* 9.6  HGB 7.9* 8.7* 8.8* 9.2*  HCT 24.6* 26.0* 26.8* 28.0*  MCV 105.1* 104.7* 105.4* 107.1*  PLT 50* 46* 70* 63*    Cardiac Enzymes: No results for input(s): CKTOTAL, CKMB, CKMBINDEX, TROPONINI in the last 168 hours.  BNP: Invalid input(s): POCBNP  CBG:  Recent Labs Lab 02/01/17 1610 02/01/17 1111 02/01/17 1611 02/01/17 2121 02/02/17 9604  GLUCAP 132* 115* 95 148* 115*    Microbiology: Results for orders placed or performed during the hospital encounter of 05-31-17  Blood Culture (routine x 2)     Status: None   Collection Time: 05-31-17 10:27 AM  Result Value Ref Range Status   Specimen Description BLOOD LEFT ARM  Final   Special Requests BOTTLES DRAWN AEROBIC AND ANAEROBIC ANA2ML AER3ML  Final   Culture NO GROWTH 5 DAYS  Final   Report Status 01/26/2017 FINAL  Final  C difficile quick scan w PCR reflex     Status: None   Collection Time: 05-31-17 11:28 AM  Result Value Ref Range Status   C Diff antigen NEGATIVE NEGATIVE Final   C Diff toxin NEGATIVE NEGATIVE Final   C Diff interpretation No C. difficile detected.  Final  Gastrointestinal Panel by PCR , Stool     Status: None   Collection Time: 05-31-17 11:28 AM  Result Value Ref Range Status   Campylobacter species NOT DETECTED NOT DETECTED Final   Plesimonas shigelloides NOT DETECTED NOT DETECTED Final   Salmonella species NOT DETECTED NOT DETECTED Final   Yersinia enterocolitica NOT DETECTED NOT  DETECTED Final   Vibrio species NOT DETECTED NOT DETECTED Final   Vibrio cholerae NOT DETECTED NOT DETECTED Final   Enteroaggregative E coli (EAEC) NOT DETECTED NOT DETECTED Final   Enteropathogenic E coli (EPEC) NOT DETECTED NOT DETECTED Final   Enterotoxigenic E coli (ETEC) NOT DETECTED NOT DETECTED Final   Shiga like toxin producing E coli (STEC) NOT DETECTED NOT DETECTED Final   Shigella/Enteroinvasive E coli (EIEC) NOT DETECTED NOT DETECTED Final   Cryptosporidium NOT DETECTED NOT DETECTED Final   Cyclospora cayetanensis NOT DETECTED NOT DETECTED Final   Entamoeba histolytica NOT DETECTED NOT DETECTED Final   Giardia lamblia NOT DETECTED NOT DETECTED Final   Adenovirus F40/41 NOT DETECTED NOT DETECTED Final   Astrovirus NOT DETECTED NOT DETECTED Final   Norovirus GI/GII NOT DETECTED NOT DETECTED Final   Rotavirus A NOT DETECTED NOT DETECTED Final   Sapovirus (I, II, IV, and V) NOT DETECTED NOT DETECTED Final  Blood Culture (routine x 2)     Status: None   Collection Time: 05-31-17 11:28 AM  Result Value Ref Range Status   Specimen Description BLOOD RIGHT PICC  Final   Special Requests BOTTLES DRAWN AEROBIC AND ANAEROBIC ANA7ML AER9ML  Final   Culture NO GROWTH 5 DAYS  Final   Report Status 01/26/2017 FINAL  Final  Urine culture     Status: None   Collection Time: 05-31-17 12:10 PM  Result Value Ref Range Status   Specimen Description URINE, RANDOM  Final   Special Requests NONE  Final   Culture   Final    NO GROWTH Performed at [facility], [address] [place], [place] [address]    Report Status 01/22/2017 FINAL  Final  MRSA PCR Screening     Status: None   Collection Time: 05-31-17  6:07 PM  Result Value Ref Range Status   MRSA by PCR NEGATIVE NEGATIVE Final    Comment:        The GeneXpert MRSA Assay (FDA approved for NASAL specimens only), is one component of a comprehensive MRSA colonization surveillance program. It is not intended to diagnose  MRSA infection nor to guide or monitor treatment for MRSA infections.     Coagulation Studies: No results for input(s): LABPROT, INR in the last 72 hours.  Urinalysis: No results for input(s):  COLORURINE, LABSPEC, PHURINE, GLUCOSEU, HGBUR, BILIRUBINUR, KETONESUR, PROTEINUR, UROBILINOGEN, NITRITE, LEUKOCYTESUR in the last 72 hours.  Invalid input(s): APPERANCEUR    Imaging: No results found.   Medications:    . cefTRIAXone (ROCEPHIN) IVPB 2 gram/50 mL D5W (Pyxis)  2 g Intravenous Q24H  . feeding supplement (ENSURE ENLIVE)  237 mL Oral TID BM  . feeding supplement (PRO-STAT SUGAR FREE 64)  30 mL Oral BID  . folic acid  1 mg Oral Daily  . hydrocerin   Topical BID  . insulin aspart  0-9 Units Subcutaneous TID WC  . mouth rinse  15 mL Mouth Rinse BID  . metroNIDAZOLE  500 mg Oral Q8H  . midodrine  10 mg Oral TID WC  . pantoprazole  40 mg Oral BID AC  . psyllium  1 packet Oral Daily  . sodium chloride flush  10-40 mL Intracatheter Q12H  . sucralfate  1 g Oral TID WC & HS   sodium chloride, ipratropium-albuterol, ondansetron (ZOFRAN) IV, sodium chloride flush  Assessment/ Plan:  [name] is a 56 y.o. white male with decubitus ulcer, hypertension, congestive heart failure, atrial fibrillation, Alcoholic cirrhosis, depression who was admitted to [facility] from 1/14 to 2/2 for sepsis. He was discharged with metronidazole and ceftriaxone for a total of six weeks. Patient was admitted to [facility] on 02/06/17 with sepsis.   1. Acute renal failure:  Oliguric. Off vasopressors. Baseline creatinine of 0.86 on 01/11/17 CRRT on 2/9-2/15. -  Renal failure unfortunately persists. If his acute renal failure is prolonged he will likely need treatment and outpatient dialysis center. Discussed with care management.  2.  Alcoholic cirrhosis, Anasarca/Edema - 3rd spacing from low albumin,   -Continue midodrine and ultrafiltration with dialysis.  3. Secondary hyperparathyroidism.   Phosphorus currently 3.9 and acceptable. Continue to monitor bone mineral metabolism parameters.  4. Hypotension. Continue midodrine 10 mg by mouth 3 times a day.    LOS: 12 Verlin Duke 2/21/20184:32 PM

## 2017-02-02 NOTE — Care Management (Signed)
Peer to Peer arranged with Dr. Juliene PinaMody and Dr. Alvester MorinNewton at [facility]

## 2017-02-03 LAB — BASIC METABOLIC PANEL
ANION GAP: 5 (ref 5–15)
BUN: 28 mg/dL — ABNORMAL HIGH (ref 6–20)
CO2: 28 mmol/L (ref 22–32)
Calcium: 7.8 mg/dL — ABNORMAL LOW (ref 8.9–10.3)
Chloride: 101 mmol/L (ref 101–111)
Creatinine, Ser: 3.47 mg/dL — ABNORMAL HIGH (ref 0.61–1.24)
GFR calc Af Amer: 21 mL/min — ABNORMAL LOW (ref >60)
GFR, EST NON AFRICAN AMERICAN: 18 mL/min — AB (ref >60)
Glucose, Bld: 137 mg/dL — ABNORMAL HIGH (ref 65–99)
POTASSIUM: 3.7 mmol/L (ref 3.5–5.1)
SODIUM: 134 mmol/L — AB (ref 135–145)

## 2017-02-03 LAB — C DIFFICILE QUICK SCREEN W PCR REFLEX
C DIFFICLE (CDIFF) ANTIGEN: NEGATIVE
C Diff interpretation: NOT DETECTED
C Diff toxin: NEGATIVE

## 2017-02-03 LAB — GLUCOSE, CAPILLARY
GLUCOSE-CAPILLARY: 130 mg/dL — AB (ref 65–99)
GLUCOSE-CAPILLARY: 124 mg/dL — AB (ref 65–99)
Glucose-Capillary: 174 mg/dL — ABNORMAL HIGH (ref 65–99)
Glucose-Capillary: 113 mg/dL — ABNORMAL HIGH (ref 65–99)
Glucose-Capillary: 118 mg/dL — ABNORMAL HIGH (ref 65–99)

## 2017-02-03 LAB — CBC
HCT: 27.2 % — ABNORMAL LOW (ref 40.0–52.0)
Hemoglobin: 9.2 g/dL — ABNORMAL LOW (ref 13.0–18.0)
MCH: 35.5 pg — ABNORMAL HIGH (ref 26.0–34.0)
MCHC: 33.6 g/dL (ref 32.0–36.0)
MCV: 105.6 fL — ABNORMAL HIGH (ref 80.0–100.0)
PLATELETS: 65 10*3/uL — AB (ref 150–440)
RBC: 2.58 MIL/uL — AB (ref 4.40–5.90)
RDW: 20.4 % — ABNORMAL HIGH (ref 11.5–14.5)
WBC: 10.1 10*3/uL (ref 3.8–10.6)

## 2017-02-03 MED ORDER — LOPERAMIDE HCL 2 MG PO CAPS
2.0000 mg | ORAL_CAPSULE | ORAL | Status: DC | PRN
  Administered 2017-02-04 – 2017-02-07 (×3): 2 mg via ORAL
  Filled 2017-02-03 (×3): qty 1

## 2017-02-03 MED ORDER — ZOLPIDEM TARTRATE 5 MG PO TABS: 5.0000 mg | ORAL_TABLET | Freq: Every evening | ORAL | Status: DC | PRN

## 2017-02-03 NOTE — Progress Notes (Signed)
I spoke with Dr Alvester Morinnewton from [employer] [facility]  She was unaware that patient had required dialysis and is currently anuric and receiving HD. I discussed Mr Spicher's medical course and plan. He still requires further HD and treatment for the sacral decubitus. In my opinion he should [facility] to an LTAC and not to SNF as his previous discharge which brought him back again here within a week's time with sepsis and acute renal failure.  According to her the chosen LTAC does not have contract with [employer]. She is agreeable to LTAC to one that is contracted with [employer] I.e. [facility] but the referral and process should be restarted.  Discussed with CM, Judeth CornfieldStephanie who will look into this and speak with [employer] representative.

## 2017-02-03 NOTE — Progress Notes (Signed)
[employer] - [place] at [facility]   PATIENT NAME: [name]    MR#:  [id number]  DATE OF BIRTH:  May 21, 1961  SUBJECTIVE:   Weakness.  REVIEW OF SYSTEMS:    Review of Systems  Constitutional: Negative.  Negative for chills, fever and malaise/fatigue.  HENT: Negative.  Negative for ear discharge, ear pain, hearing loss, nosebleeds and sore throat.   Eyes: Negative.  Negative for blurred vision and pain.  Respiratory: Negative.  Negative for cough, hemoptysis, shortness of breath and wheezing.   Cardiovascular: Positive for leg swelling. Negative for chest pain and palpitations.  Gastrointestinal: Negative.  Negative for abdominal pain, blood in stool, diarrhea, nausea and vomiting.  Genitourinary: Negative for dysuria.  Musculoskeletal: Negative.  Negative for back pain.  Skin: Negative.   Neurological: Negative for dizziness, tremors, speech change, focal weakness, seizures and headaches.  Endo/Heme/Allergies: Negative.  Does not bruise/bleed easily.  Psychiatric/Behavioral: Negative.  Negative for depression, hallucinations and suicidal ideas.    Tolerating Diet: yes   DRUG ALLERGIES:   Allergies  Allergen Reactions  . Clindamycin/Lincomycin Diarrhea    VITALS:  Blood pressure (!) 80/60, pulse 88, temperature 97.4 F (36.3 C), temperature source Oral, resp. rate 19, height 6\' 4"  (1.93 m), weight (!) 308 lb 3.2 oz (139.8 kg), SpO2 96 %.  PHYSICAL EXAMINATION:   Physical Exam  Constitutional: He is oriented to person, place, and time and well-developed, well-nourished, and in no distress. No distress.  Anasarca, morbid obese.  HENT:  Head: Normocephalic.  Right IJ and PICC  Eyes: No scleral icterus.  Neck: Normal range of motion. Neck supple. No JVD present. No tracheal deviation present.  Cardiovascular: Normal rate, regular rhythm and normal heart sounds.  Exam reveals no gallop and no friction rub.   No murmur heard. Pulmonary/Chest:  Effort normal and breath sounds normal. No respiratory distress. He has no wheezes. He has no rales. He exhibits no tenderness.  Abdominal: Soft. Bowel sounds are normal. He exhibits no distension and no mass. There is no tenderness. There is no rebound and no guarding.  Musculoskeletal: Normal range of motion. He exhibits edema.  Neurological: He is alert and oriented to person, place, and time.  Skin: Skin is warm. No rash noted. No erythema.  Psychiatric: Affect and judgment normal.      LABORATORY PANEL:   CBC  Recent Labs Lab 02/03/17 0330  WBC 10.1  HGB 9.2*  HCT 27.2*  PLT 65*   ------------------------------------------------------------------------------------------------------------------  Chemistries   Recent Labs Lab 02/03/17 0330  NA 134*  K 3.7  CL 101  CO2 28  GLUCOSE 137*  BUN 28*  CREATININE 3.47*  CALCIUM 7.8*   ------------------------------------------------------------------------------------------------------------------  Cardiac Enzymes No results for input(s): TROPONINI in the last 168 hours. ------------------------------------------------------------------------------------------------------------------  RADIOLOGY:  No results found.   ASSESSMENT AND PLAN:    56 year old male with EtOH related liver cirrhosis and portal gastropathy who was recently discharged for sacral decubitus presents with septic shock due to infected sacral decubitus and acute kidney failure due to sepsis.   1. Acute hypoxic respiratory failure in the setting of pulmonary vascular congestion due to renal failure Resolved  2. Acute kidney failure with anuria received CRT from February 9 - 15th. Renal failure unfortunately persists. If his acute renal failure is prolonged he will likely need treatment and outpatient dialysis center per Dr. Cherylann Ratel.  3. EtOH liver cirrhosis with anasarca and portal hypertensive gastropathy with slow GI bleed: Continue  PPI and  Carafate.  Octreotide stopped and hemoglobin is stable .  4. Chronic thrombocytopenia due to problem #3  5. Septic shock from infected sacral decubitus (d/c from hospital on 2/9 with CTX and flagyl): Continue ceftriaxone and Flagyl as per ID recommendations   Hypotension. Continue midodrine.   Possible discharge to LTAC that is contracted with [employer] I.[facility] in 2 days per SW and CM. Management plans discussed with the patient and he is in agreement.  CODE STATUS: LIMITED  TOTAL TIME TAKING CARE OF THIS PATIENT: 35 minutes.   Shaune Pollackhen, Quintasha Gren M.D on 02/03/2017 at 5:29 PM  Between 7am to 6pm - Pager - [phone] After 6pm go to [email or web address] - password [facility]  [facility] [place] [facility]  Office  [phone]  CC: Primary care physician; Corky DownsMASOUD,[name], MD  Note: This dictation was prepared with Dragon dictation along with smaller phrase technology. Any transcriptional errors that result from this process are unintentional.

## 2017-02-03 NOTE — Progress Notes (Signed)
PT Cancellation Note  Patient Details Name: [name] MRN: [id number] DOB: September 06, 1961   Cancelled Treatment:    Reason Eval/Treat Not Completed: Patient declined, no reason specified;Other (comment)   Pt encouraged to participate in session this pm  Pt stated he did sit up with nursing at the edge of the bed earlier today for about 2 minutes, he refused further mobility.  Initially agreed to exercises but when asked to reposition in bed to allow for ROM, he refused stating he can not breath on his back.  Encouraged partial sidelying but when attempting to uncross his le's he stated "No, just leave them alone"  Pt was able to verbalize affects of immobility and how it is affecting him but then stated "If I could only get some sleep I would be better"  Will continue as appropriate.  Danielle DessSarah Aser Nylund 02/03/2017, 1:25 PM

## 2017-02-03 NOTE — Progress Notes (Signed)
[facility]  ROUNDING NOTE   Subjective:  Renal failure persists. Pt continues to have poor urine outpt.   He will be due for dialysis again tomorrow.   Objective:  Vital signs in last 24 hours:  Temp:  [97.4 F (36.3 C)-98.9 F (37.2 C)] 97.4 F (36.3 C) (02/22 1200) Pulse Rate:  [88-96] 88 (02/22 1200) Resp:  [15-19] 19 (02/22 0539) BP: (77-145)/(48-123) 80/60 (02/22 1200) SpO2:  [90 %-96 %] 96 % (02/22 1200) Weight:  [138 kg (304 lb 3.8 oz)-139.8 kg (308 lb 3.2 oz)] 139.8 kg (308 lb 3.2 oz) (02/22 0539)  Weight change: -0.295 kg (-10.4 oz) Filed Weights   02/02/17 1246 02/03/17 0500 02/03/17 0539  Weight: (!) 139.8 kg (308 lb 3.3 oz) (!) 138 kg (304 lb 3.8 oz) (!) 139.8 kg (308 lb 3.2 oz)    Intake/Output: I/O last 3 completed shifts: In: 10 [I.V.:10] Out: 1600 [Urine:245; MVHQI:6962; Stool:6]   Intake/Output this shift:  Total I/O In: 1524.2 [P.O.:200; I.V.:1024.2; IV Piggyback:300] Out: 0   Physical Exam: General: No acute distress  Head: Normocephalic, atraumatic. Moist oral mucosal membranes  Eyes: Anicteric  Neck: Supple  Lungs:  Clear to auscultation, normal effort  Heart: Regular rate and rhythm  Abdomen:  Soft, nontender, obese, distended   Extremities:  2+ dependent peripheral edema. Anasarca  Neurologic: Alert and oriented  Skin: Scattered bruising  Access: Right IJ temp HD catheter 2/9    Basic Metabolic Panel:  Recent Labs Lab 01/28/17 0515 01/29/17 1311 01/30/17 0702 01/31/17 1020 02/01/17 0311 02/02/17 0855 02/03/17 0330  NA 138 134* 134* 133* 135  --  134*  K 4.2 4.0 3.9 3.5 3.5  --  3.7  CL 108 104 104 104 104  --  101  CO2 27 24 23 24 27   --  28  GLUCOSE 138* 129* 130* 75 85  --  137*  BUN 22* 32* 40* 48* 29*  --  28*  CREATININE 2.34* 3.15* 3.72* 4.32* 3.25*  --  3.47*  CALCIUM 8.9 8.8* 8.6* 8.5* 7.9*  --  7.8*  PHOS 3.4 3.4  --  4.0  --  3.9  --     Liver Function Tests:  Recent Labs Lab 01/28/17 0515  01/29/17 1311  ALBUMIN 2.4* 2.5*   No results for input(s): LIPASE, AMYLASE in the last 168 hours. No results for input(s): AMMONIA in the last 168 hours.  CBC:  Recent Labs Lab 01/28/17 0553 01/31/17 0454 02/01/17 0311 02/03/17 0330  WBC 11.3* 12.2* 9.6 10.1  HGB 8.7* 8.8* 9.2* 9.2*  HCT 26.0* 26.8* 28.0* 27.2*  MCV 104.7* 105.4* 107.1* 105.6*  PLT 46* 70* 63* 65*    Cardiac Enzymes: No results for input(s): CKTOTAL, CKMB, CKMBINDEX, TROPONINI in the last 168 hours.  BNP: Invalid input(s): POCBNP  CBG:  Recent Labs Lab 02/02/17 0729 02/02/17 1645 02/02/17 2117 02/03/17 0720 02/03/17 1128  GLUCAP 115* 174* 153* 130* 124*    Microbiology: Results for orders placed or performed during the hospital encounter of 02/04/2017  Blood Culture (routine x 2)     Status: None   Collection Time: 01/13/2017 10:27 AM  Result Value Ref Range Status   Specimen Description BLOOD LEFT ARM  Final   Special Requests BOTTLES DRAWN AEROBIC AND ANAEROBIC ANA2ML AER3ML  Final   Culture NO GROWTH 5 DAYS  Final   Report Status 01/26/2017 FINAL  Final  C difficile quick scan w PCR reflex     Status: None  Collection Time: 02/07/2017 11:28 AM  Result Value Ref Range Status   C Diff antigen NEGATIVE NEGATIVE Final   C Diff toxin NEGATIVE NEGATIVE Final   C Diff interpretation No C. difficile detected.  Final  Gastrointestinal Panel by PCR , Stool     Status: None   Collection Time: 02/05/2017 11:28 AM  Result Value Ref Range Status   Campylobacter species NOT DETECTED NOT DETECTED Final   Plesimonas shigelloides NOT DETECTED NOT DETECTED Final   Salmonella species NOT DETECTED NOT DETECTED Final   Yersinia enterocolitica NOT DETECTED NOT DETECTED Final   Vibrio species NOT DETECTED NOT DETECTED Final   Vibrio cholerae NOT DETECTED NOT DETECTED Final   Enteroaggregative E coli (EAEC) NOT DETECTED NOT DETECTED Final   Enteropathogenic E coli (EPEC) NOT DETECTED NOT DETECTED Final    Enterotoxigenic E coli (ETEC) NOT DETECTED NOT DETECTED Final   Shiga like toxin producing E coli (STEC) NOT DETECTED NOT DETECTED Final   Shigella/Enteroinvasive E coli (EIEC) NOT DETECTED NOT DETECTED Final   Cryptosporidium NOT DETECTED NOT DETECTED Final   Cyclospora cayetanensis NOT DETECTED NOT DETECTED Final   Entamoeba histolytica NOT DETECTED NOT DETECTED Final   Giardia lamblia NOT DETECTED NOT DETECTED Final   Adenovirus F40/41 NOT DETECTED NOT DETECTED Final   Astrovirus NOT DETECTED NOT DETECTED Final   Norovirus GI/GII NOT DETECTED NOT DETECTED Final   Rotavirus A NOT DETECTED NOT DETECTED Final   Sapovirus (I, II, IV, and V) NOT DETECTED NOT DETECTED Final  Blood Culture (routine x 2)     Status: None   Collection Time: 01/29/2017 11:28 AM  Result Value Ref Range Status   Specimen Description BLOOD RIGHT PICC  Final   Special Requests BOTTLES DRAWN AEROBIC AND ANAEROBIC ANA7ML AER9ML  Final   Culture NO GROWTH 5 DAYS  Final   Report Status 01/26/2017 FINAL  Final  Urine culture     Status: None   Collection Time: 01/22/2017 12:10 PM  Result Value Ref Range Status   Specimen Description URINE, RANDOM  Final   Special Requests NONE  Final   Culture   Final    NO GROWTH Performed at [facility], [address] [place], [place] [address]    Report Status 01/22/2017 FINAL  Final  MRSA PCR Screening     Status: None   Collection Time: 02/08/2017  6:07 PM  Result Value Ref Range Status   MRSA by PCR NEGATIVE NEGATIVE Final    Comment:        The GeneXpert MRSA Assay (FDA approved for NASAL specimens only), is one component of a comprehensive MRSA colonization surveillance program. It is not intended to diagnose MRSA infection nor to guide or monitor treatment for MRSA infections.     Coagulation Studies: No results for input(s): LABPROT, INR in the last 72 hours.  Urinalysis: No results for input(s): COLORURINE, LABSPEC, PHURINE, GLUCOSEU, HGBUR,  BILIRUBINUR, KETONESUR, PROTEINUR, UROBILINOGEN, NITRITE, LEUKOCYTESUR in the last 72 hours.  Invalid input(s): APPERANCEUR    Imaging: No results found.   Medications:    . cefTRIAXone (ROCEPHIN) IVPB 2 gram/50 mL D5W (Pyxis)  2 g Intravenous Q24H  . feeding supplement (ENSURE ENLIVE)  237 mL Oral TID BM  . feeding supplement (PRO-STAT SUGAR FREE 64)  30 mL Oral BID  . folic acid  1 mg Oral Daily  . hydrocerin   Topical BID  . insulin aspart  0-9 Units Subcutaneous TID WC  . mouth rinse  15 mL Mouth Rinse BID  . metroNIDAZOLE  500 mg Oral Q8H  . midodrine  10 mg Oral TID WC  . pantoprazole  40 mg Oral BID AC  . psyllium  1 packet Oral Daily  . sodium chloride flush  10-40 mL Intracatheter Q12H  . sucralfate  1 g Oral TID WC & HS   sodium chloride, ipratropium-albuterol, ondansetron (ZOFRAN) IV, sodium chloride flush  Assessment/ Plan:  Mr. [name] is a 56 y.o. white male with decubitus ulcer, hypertension, congestive heart failure, atrial fibrillation, Alcoholic cirrhosis, depression who was admitted to [facility] from 1/14 to 2/2 for sepsis. He was discharged with metronidazole and ceftriaxone for a total of six weeks. Patient was admitted to [facility] on 01/14/2017 with sepsis.   1. Acute renal failure:  Oliguric. Off vasopressors. Baseline creatinine of 0.86 on 01/11/17 CRRT on 2/9-2/15. -  Renal failure unfortunately persists. If his acute renal failure is prolonged he will likely need treatment and outpatient dialysis center. Discussed with care management.  2.  Alcoholic cirrhosis, Anasarca/Edema, hypotension - 3rd spacing from low albumin,   -Continues to have considerable edema, continue UF with HD.   3. Secondary hyperparathyroidism.  Follow up phosphorus in the AM.   4. Hypotension. Continue midodrine 10 mg by mouth 3 times a day, blood pressure 80/60.     LOS: 13 [name] 2/22/20182:52 PM

## 2017-02-04 LAB — CBC
HCT: 24.2 % — ABNORMAL LOW (ref 40.0–52.0)
HCT: 25.9 % — ABNORMAL LOW (ref 40.0–52.0)
HEMOGLOBIN: 8.1 g/dL — AB (ref 13.0–18.0)
HEMOGLOBIN: 8.6 g/dL — AB (ref 13.0–18.0)
MCH: 35.7 pg — ABNORMAL HIGH (ref 26.0–34.0)
MCH: 35.4 pg — AB (ref 26.0–34.0)
MCHC: 33.5 g/dL (ref 32.0–36.0)
MCHC: 33.2 g/dL (ref 32.0–36.0)
MCV: 106.8 fL — ABNORMAL HIGH (ref 80.0–100.0)
MCV: 106.6 fL — ABNORMAL HIGH (ref 80.0–100.0)
Platelets: 68 10*3/uL — ABNORMAL LOW (ref 150–440)
Platelets: 78 10*3/uL — ABNORMAL LOW (ref 150–440)
RBC: 2.27 MIL/uL — AB (ref 4.40–5.90)
RBC: 2.43 MIL/uL — ABNORMAL LOW (ref 4.40–5.90)
RDW: 19.7 % — ABNORMAL HIGH (ref 11.5–14.5)
RDW: 20 % — ABNORMAL HIGH (ref 11.5–14.5)
WBC: 10.8 10*3/uL — AB (ref 3.8–10.6)
WBC: 11 10*3/uL — ABNORMAL HIGH (ref 3.8–10.6)

## 2017-02-04 LAB — BASIC METABOLIC PANEL
ANION GAP: 7 (ref 5–15)
BUN: 44 mg/dL — ABNORMAL HIGH (ref 6–20)
CHLORIDE: 102 mmol/L (ref 101–111)
CO2: 26 mmol/L (ref 22–32)
CREATININE: 4.57 mg/dL — AB (ref 0.61–1.24)
Calcium: 8 mg/dL — ABNORMAL LOW (ref 8.9–10.3)
GFR calc Af Amer: 15 mL/min — ABNORMAL LOW (ref >60)
GFR calc non Af Amer: 13 mL/min — ABNORMAL LOW (ref >60)
Glucose, Bld: 114 mg/dL — ABNORMAL HIGH (ref 65–99)
POTASSIUM: 3.7 mmol/L (ref 3.5–5.1)
SODIUM: 135 mmol/L (ref 135–145)

## 2017-02-04 LAB — GLUCOSE, CAPILLARY
GLUCOSE-CAPILLARY: 121 mg/dL — AB (ref 65–99)
GLUCOSE-CAPILLARY: 127 mg/dL — AB (ref 65–99)
GLUCOSE-CAPILLARY: 162 mg/dL — AB (ref 65–99)
Glucose-Capillary: 77 mg/dL (ref 65–99)
Glucose-Capillary: 146 mg/dL — ABNORMAL HIGH (ref 65–99)

## 2017-02-04 LAB — OCCULT BLOOD X 1 CARD TO LAB, STOOL: FECAL OCCULT BLD: POSITIVE — AB

## 2017-02-04 LAB — PHOSPHORUS: PHOSPHORUS: 3.1 mg/dL (ref 2.5–4.6)

## 2017-02-04 MED ORDER — SODIUM CHLORIDE 0.9 % IV BOLUS (SEPSIS)
500.0000 mL | Freq: Once | INTRAVENOUS | Status: AC
  Administered 2017-02-04: 500 mL via INTRAVENOUS

## 2017-02-04 MED ORDER — SODIUM CHLORIDE 0.9 % IV BOLUS (SEPSIS)
250.0000 mL | Freq: Once | INTRAVENOUS | Status: AC
  Administered 2017-02-04: 250 mL via INTRAVENOUS

## 2017-02-04 MED ORDER — NOREPINEPHRINE BITARTRATE 1 MG/ML IV SOLN
0.0000 ug/min | INTRAVENOUS | Status: DC
  Administered 2017-02-04: 10 ug/min via INTRAVENOUS
  Administered 2017-02-04 – 2017-02-06 (×3): 5 ug/min via INTRAVENOUS
  Filled 2017-02-04 (×5): qty 4

## 2017-02-04 NOTE — Progress Notes (Signed)
[facility] [place] [facility]   PATIENT NAME: [name] BrasMichael Glover    MR#:  [id number]  DATE OF BIRTH:  [birth year]  SUBJECTIVE:   Weakness and SOB. BP down to 70/51. BP was low last night, given NS bolus, but still hypotension. Hypoxia, put on O2 [place] 3L.  REVIEW OF SYSTEMS:    Review of Systems  Constitutional: Positive for malaise/fatigue. Negative for chills and fever.  HENT: Negative.  Negative for ear discharge, ear pain, hearing loss, nosebleeds and sore throat.   Eyes: Negative.  Negative for blurred vision and pain.  Respiratory: Positive for shortness of breath. Negative for cough, hemoptysis and wheezing.   Cardiovascular: Positive for leg swelling. Negative for chest pain and palpitations.  Gastrointestinal: Negative.  Negative for abdominal pain, blood in stool, diarrhea, nausea and vomiting.  Genitourinary: Negative for dysuria.  Musculoskeletal: Negative.  Negative for back pain.  Skin: Negative.   Neurological: Positive for weakness. Negative for dizziness, tremors, speech change, focal weakness, seizures and headaches.  Endo/Heme/Allergies: Negative.  Does not bruise/bleed easily.  Psychiatric/Behavioral: Negative.  Negative for depression, hallucinations and suicidal ideas.    Tolerating Diet: yes   DRUG ALLERGIES:   Allergies  Allergen Reactions  . Clindamycin/Lincomycin Diarrhea    VITALS:  Blood pressure (!) 74/50, pulse 95, temperature 97.7 F (36.5 C), temperature source Oral, resp. rate 19, height 6\' 4"  (1.93 m), weight (!) 302 lb 1.6 oz (137 kg), SpO2 92 %.  PHYSICAL EXAMINATION:   Physical Exam  Constitutional: He is oriented to person, place, and time and well-developed, well-nourished, and in no distress. No distress.  Anasarca, morbid obese.  HENT:  Head: Normocephalic.  Right IJ and PICC  Eyes: No scleral icterus.  Neck: Normal range of motion. Neck supple. No JVD present. No tracheal deviation present.  Cardiovascular:  Normal rate, regular rhythm and normal heart sounds.  Exam reveals no gallop and no friction rub.   No murmur heard. Pulmonary/Chest: Effort normal and breath sounds normal. No respiratory distress. He has no wheezes. He has no rales. He exhibits no tenderness.  Abdominal: Soft. Bowel sounds are normal. He exhibits distension. He exhibits no mass. There is no tenderness. There is no rebound and no guarding.  Musculoskeletal: Normal range of motion. He exhibits edema.  Neurological: He is alert and oriented to person, place, and time.  Bilateral edema with chronic skin changes.  Skin: Skin is warm. No rash noted. No erythema.  Psychiatric: Affect and judgment normal.      LABORATORY PANEL:   CBC  Recent Labs Lab 02/04/17 0500  WBC 10.8*  HGB 8.1*  HCT 24.2*  PLT 68*   ------------------------------------------------------------------------------------------------------------------  Chemistries   Recent Labs Lab 02/04/17 0500  NA 135  K 3.7  CL 102  CO2 26  GLUCOSE 114*  BUN 44*  CREATININE 4.57*  CALCIUM 8.0*   ------------------------------------------------------------------------------------------------------------------  Cardiac Enzymes No results for input(s): TROPONINI in the last 168 hours. ------------------------------------------------------------------------------------------------------------------  RADIOLOGY:  No results found.   ASSESSMENT AND PLAN:    56 year old male with EtOH related liver cirrhosis and portal gastropathy who was recently discharged for sacral decubitus presents with septic shock due to infected sacral decubitus and acute kidney failure due to sepsis.   1. Acute hypoxic respiratory failure in the setting of pulmonary vascular congestion due to renal failure Resolved but hypoxia again, on OO2 [place] 3L.  2. Acute kidney failure with anuria received CRT from February 9 - 15th. Renal failure unfortunately persists.  If his acute  renal failure is prolonged he will likely need treatment and outpatient dialysis center per Dr. Cherylann Ratel.  3. EtOH liver cirrhosis with anasarca and portal hypertensive gastropathy with slow GI bleed: Continue  PPI and Carafate. FOTB positive. Octreotide stopped.  hemoglobin down to 8.1 today.  4. Chronic thrombocytopenia due to problem #3, stable.  5. Septic shock from infected sacral decubitus (d/c from hospital on 2/9 with CTX and flagyl): Continue ceftriaxone and Flagyl as per ID recommendations   Hypotension. NS bolus, levophed drip prn.  Continue midodrine.   Possible discharge to LTAC that is contracted with [employer] I.e. [facility] in ? days per SW and CM. Management plans discussed with the patient and he is in agreement.  CODE STATUS: LIMITED  TOTAL CRITICAL TIME TAKING CARE OF THIS PATIENT: 43 minutes.   Shaune Pollack M.D on 02/04/2017 at 9:01 AM  Between 7am to 6pm - Pager - [phone] After 6pm go to [email or web address] - password [email or web address]  [facility]  Office  [phone]  CC: Primary care physician; Corky Downs, MD  Note: This dictation was prepared with Dragon dictation along with smaller phrase technology. Any transcriptional errors that result from this process are unintentional.

## 2017-02-04 NOTE — Progress Notes (Signed)
Pt.'s current BP is 70/51 automatically, RN rechecked BP manually and was 74/50, RN asked charge nurse Christene Slatesrika Hopkins to recheck manually also and she received 70/50. ICU charge nurse Geanie BerlinStaci was called to come lay eyes on pt. Pt is currently on 02- 3L at 93%, O2 was placed last night 02/03/2017, pt is stating that "he feels SOB and does not feel good". Pt.'s current HR is 110-120. ICU charge nurse stated that they have a bed available and to go ahead and transfer pt. Dr. Imogene Burnhen was notified of pt.'s current's condition.  Dr. Imogene Burnhen agrees with transfer. Pt and pt.'s belongings were transferred to bed 15 in stepdown.  Dejanae Helser [place]

## 2017-02-04 NOTE — Care Management (Signed)
I met with patient and his girlfriend today. He is aware that LTAC has again been denied and will call [employer] to discuss (patient has copy of insurance card/telephone numbers/reference number). His HD schedule is T, Th, Sat at [place] [place] but he must be able to tolerate sitting for dialysis time. O2 is acute. History of [facility] for home health. Used a walker to ambulate in December- states he was able to walk in January "but not sure he can walk anymore as that was in January". Patient does not have a PT order- discussed with RN.

## 2017-02-04 NOTE — Progress Notes (Signed)
Nutrition Follow-up  DOCUMENTATION CODES:   Severe malnutrition in context of acute illness/injury, Obesity unspecified  INTERVENTION:  Recommend discontinuing Renal diet and returning patient to Regular diet as he has been on this admission. Patient with very poor PO intake and potassium and phosphorus are WNL.  Discussed nutrition plan of care with Dr. Bridgett Larsson. Recommend consideration of short-term vs long-term access for enteral nutrition as patient is unable to meet his needs with PO intake. He is not receiving enough substrate to support wound healing or prevent further weight loss.   Continue Ensure Enlive po TID, each supplement provides 350 kcal and 20 grams of protein.  Continue Pro-Stat 30 ml BID, each supplement provides 100 kcal and 15 grams of protein.  Continue yogurt BID as snack between meal for patient.  NUTRITION DIAGNOSIS:   Malnutrition related to acute illness as evidenced by energy intake < or equal to 50% for > or equal to 5 days, severe fluid accumulation, mild depletion of body fat, percent weight loss, mild depletion of muscle mass.  Ongoing.  GOAL:   Patient will meet greater than or equal to 90% of their needs  Not met.   MONITOR:   PO intake, Supplement acceptance, Labs, Weight trends  REASON FOR ASSESSMENT:   Rounds    ASSESSMENT:   56 yo male admitted with septic shock secondary to infected decubitus ulcer, acute respiratory failure due to pulmonary congestion, ARF with oligruia  -Patient placed on 3L [place] evening of 2/22 due to SOB. Became hypotensive today and required transfer to stepdown.   Spoke with patient and his girlfriend at bedside. Patient did not have anything to eat yesterday and is working on his first meal today (honey biscuit and macaroni and cheese from [place]). He had only taken a few bites of macaroni and had 1/2 of biscuit. Patient has had one Ensure Enlive and is working on drinking a Pro-Stat mixed in juice.   In the  past 24 hours patient has had approximately 585 kcal (23% minimum estimated kcal needs) and 35 grams of protein (25% minimum estimated protein needs).   Medications reviewed and include: ceftriaxone, folic acid 1 mg daily, Novolog sliding scale TID with meals, pantoprazole, Carafate, norepinephrine gtt. Psyllium ordered to help with loose bowel movements was discontinued.   Labs reviewed: CBG 77-127 past 24 hrs, BUN 44, Creatinine 4.56.   Weight trend: 139.8 kg on 2/23 (+12.8 kg from admission - likely due to fluid retention as patient has ascites, bilateral edema in arms and legs)  Diet Order:  Diet renal with fluid restriction Fluid restriction: 1200 mL Fluid; Room service appropriate? Yes; Fluid consistency: Thin  Skin:  Wound (see comment) (unstageable, full thickeness tissue loss on buttock)  Last BM:  02/01/2017  Height:   Ht Readings from Last 1 Encounters:  02/04/17 6' 4"  (1.93 m)    Weight:   Wt Readings from Last 1 Encounters:  02/04/17 (!) 318 lb 2 oz (144.3 kg)    Ideal Body Weight:  91.8 kg  BMI:  Body mass index is 38.72 kg/m.  Estimated Nutritional Needs:   Kcal:  [phone] kcals  Protein:  138-180 g  Fluid:  1000 mL plus UOP or per MD  EDUCATION NEEDS:   No education needs identified at this time  Willey Blade, MS, RD, LDN Pager: [phone] After Hours Pager: [phone]

## 2017-02-04 NOTE — Progress Notes (Signed)
POST DIALYSIS ASSESSMENT 

## 2017-02-04 NOTE — Progress Notes (Signed)
[facility]  ROUNDING NOTE   Subjective:  Patient transitioned to critical care unit for hypotension. Currently however he is awake, alert, and following commands. He will be due for dialysis today. Next line he is currently receiving an IV fluid bolus. Next line he was also having loose bowels with blood mixed in.  Objective:  Vital signs in last 24 hours:  Temp:  [97.4 F (36.3 C)-98 F (36.7 C)] 97.5 F (36.4 C) (02/23 0900) Pulse Rate:  [84-120] 95 (02/23 0900) Resp:  [17-20] 19 (02/23 0900) BP: (62-106)/(41-88) 74/50 (02/23 0819) SpO2:  [92 %-96 %] 92 % (02/23 0900) Weight:  [137 kg (302 lb 1.6 oz)-139.8 kg (308 lb 3.3 oz)] 139.8 kg (308 lb 3.3 oz) (02/23 0900)  Weight change: -3.968 kg (-8 lb 12 oz) Filed Weights   02/03/17 0539 02/04/17 0500 02/04/17 0900  Weight: (!) 139.8 kg (308 lb 3.2 oz) (!) 137 kg (302 lb 1.6 oz) (!) 139.8 kg (308 lb 3.3 oz)    Intake/Output: I/O last 3 completed shifts: In: 2029.2 [P.O.:378; I.V.:1034.2; Other:30; IV Piggyback:587] Out: 362 [Urine:360; Stool:2]   Intake/Output this shift:  No intake/output data recorded.  Physical Exam: General: No acute distress  Head: Normocephalic, atraumatic. Moist oral mucosal membranes  Eyes: Anicteric  Neck: Supple  Lungs:  Clear to auscultation, normal effort  Heart: Regular rate and rhythm  Abdomen:  Soft, nontender, obese, distended   Extremities: 2+ dependent peripheral edema. Anasarca  Neurologic: Alert and oriented, following commands  Skin: Scattered bruising  Access: Right IJ temp HD catheter 2/9    Basic Metabolic Panel:  Recent Labs Lab 01/29/17 1311 01/30/17 0702 01/31/17 1020 02/01/17 0311 02/02/17 0855 02/03/17 0330 02/04/17 0500  NA 134* 134* 133* 135  --  134* 135  K 4.0 3.9 3.5 3.5  --  3.7 3.7  CL 104 104 104 104  --  101 102  CO2 24 23 24 27   --  28 26  GLUCOSE 129* 130* 75 85  --  137* 114*  BUN 32* 40* 48* 29*  --  28* 44*  CREATININE 3.15* 3.72*  4.32* 3.25*  --  3.47* 4.57*  CALCIUM 8.8* 8.6* 8.5* 7.9*  --  7.8* 8.0*  PHOS 3.4  --  4.0  --  3.9  --   --     Liver Function Tests:  Recent Labs Lab 01/29/17 1311  ALBUMIN 2.5*   No results for input(s): LIPASE, AMYLASE in the last 168 hours. No results for input(s): AMMONIA in the last 168 hours.  CBC:  Recent Labs Lab 01/31/17 0454 02/01/17 0311 02/03/17 0330 02/04/17 0500  WBC 12.2* 9.6 10.1 10.8*  HGB 8.8* 9.2* 9.2* 8.1*  HCT 26.8* 28.0* 27.2* 24.2*  MCV 105.4* 107.1* 105.6* 106.8*  PLT 70* 63* 65* 68*    Cardiac Enzymes: No results for input(s): CKTOTAL, CKMB, CKMBINDEX, TROPONINI in the last 168 hours.  BNP: Invalid input(s): POCBNP  CBG:  Recent Labs Lab 02/03/17 1128 02/03/17 1636 02/03/17 2122 02/04/17 0732 02/04/17 0849  GLUCAP 124* 113* 118* 121* 127*    Microbiology: Results for orders placed or performed during the hospital encounter of 02/19/17  Blood Culture (routine x 2)     Status: None   Collection Time: 02-19-17 10:27 AM  Result Value Ref Range Status   Specimen Description BLOOD LEFT ARM  Final   Special Requests BOTTLES DRAWN AEROBIC AND ANAEROBIC ANA2ML AER3ML  Final   Culture NO GROWTH 5 DAYS  Final  Report Status 01/26/2017 FINAL  Final  C difficile quick scan w PCR reflex     Status: None   Collection Time: 01/18/2017 11:28 AM  Result Value Ref Range Status   C Diff antigen NEGATIVE NEGATIVE Final   C Diff toxin NEGATIVE NEGATIVE Final   C Diff interpretation No C. difficile detected.  Final  Gastrointestinal Panel by PCR , Stool     Status: None   Collection Time: 02/08/2017 11:28 AM  Result Value Ref Range Status   Campylobacter species NOT DETECTED NOT DETECTED Final   Plesimonas shigelloides NOT DETECTED NOT DETECTED Final   Salmonella species NOT DETECTED NOT DETECTED Final   Yersinia enterocolitica NOT DETECTED NOT DETECTED Final   Vibrio species NOT DETECTED NOT DETECTED Final   Vibrio cholerae NOT DETECTED NOT  DETECTED Final   Enteroaggregative E coli (EAEC) NOT DETECTED NOT DETECTED Final   Enteropathogenic E coli (EPEC) NOT DETECTED NOT DETECTED Final   Enterotoxigenic E coli (ETEC) NOT DETECTED NOT DETECTED Final   Shiga like toxin producing E coli (STEC) NOT DETECTED NOT DETECTED Final   Shigella/Enteroinvasive E coli (EIEC) NOT DETECTED NOT DETECTED Final   Cryptosporidium NOT DETECTED NOT DETECTED Final   Cyclospora cayetanensis NOT DETECTED NOT DETECTED Final   Entamoeba histolytica NOT DETECTED NOT DETECTED Final   Giardia lamblia NOT DETECTED NOT DETECTED Final   Adenovirus F40/41 NOT DETECTED NOT DETECTED Final   Astrovirus NOT DETECTED NOT DETECTED Final   Norovirus GI/GII NOT DETECTED NOT DETECTED Final   Rotavirus A NOT DETECTED NOT DETECTED Final   Sapovirus (I, II, IV, and V) NOT DETECTED NOT DETECTED Final  Blood Culture (routine x 2)     Status: None   Collection Time: 02/08/2017 11:28 AM  Result Value Ref Range Status   Specimen Description BLOOD RIGHT PICC  Final   Special Requests BOTTLES DRAWN AEROBIC AND ANAEROBIC ANA7ML AER9ML  Final   Culture NO GROWTH 5 DAYS  Final   Report Status 01/26/2017 FINAL  Final  Urine culture     Status: None   Collection Time: 02/01/2017 12:10 PM  Result Value Ref Range Status   Specimen Description URINE, RANDOM  Final   Special Requests NONE  Final   Culture   Final    NO GROWTH Performed at [facility], [address], [place], [place] [address]    Report Status 01/22/2017 FINAL  Final  MRSA PCR Screening     Status: None   Collection Time: 02/05/2017  6:07 PM  Result Value Ref Range Status   MRSA by PCR NEGATIVE NEGATIVE Final    Comment:        The GeneXpert MRSA Assay (FDA approved for NASAL specimens only), is one component of a comprehensive MRSA colonization surveillance program. It is not intended to diagnose MRSA infection nor to guide or monitor treatment for MRSA infections.   C difficile quick scan w PCR  reflex     Status: None   Collection Time: 02/03/17  4:26 PM  Result Value Ref Range Status   C Diff antigen NEGATIVE NEGATIVE Final   C Diff toxin NEGATIVE NEGATIVE Final   C Diff interpretation No C. difficile detected.  Final    Coagulation Studies: No results for input(s): LABPROT, INR in the last 72 hours.  Urinalysis: No results for input(s): COLORURINE, LABSPEC, PHURINE, GLUCOSEU, HGBUR, BILIRUBINUR, KETONESUR, PROTEINUR, UROBILINOGEN, NITRITE, LEUKOCYTESUR in the last 72 hours.  Invalid input(s): APPERANCEUR    Imaging: No  results found.   Medications:    . cefTRIAXone (ROCEPHIN) IVPB 2 gram/50 mL D5W (Pyxis)  2 g Intravenous Q24H  . feeding supplement (ENSURE ENLIVE)  237 mL Oral TID BM  . feeding supplement (PRO-STAT SUGAR FREE 64)  30 mL Oral BID  . folic acid  1 mg Oral Daily  . hydrocerin   Topical BID  . insulin aspart  0-9 Units Subcutaneous TID WC  . mouth rinse  15 mL Mouth Rinse BID  . metroNIDAZOLE  500 mg Oral Q8H  . midodrine  10 mg Oral TID WC  . pantoprazole  40 mg Oral BID AC  . sodium chloride  500 mL Intravenous Once  . sodium chloride flush  10-40 mL Intracatheter Q12H  . sucralfate  1 g Oral TID WC & HS   sodium chloride, ipratropium-albuterol, loperamide, ondansetron (ZOFRAN) IV, sodium chloride flush, zolpidem  Assessment/ Plan:  Mr. [name] RouteMichael J Degan is a 56 y.o. white male with decubitus ulcer, hypertension, congestive heart failure, atrial fibrillation, Alcoholic cirrhosis, depression who was admitted to [facility] from 1/14 to 2/2 for sepsis. He was discharged with metronidazole and ceftriaxone for a total of six weeks. [facility] with sepsis.   1. Acute renal failure:  Oliguric. Off vasopressors. Baseline creatinine of 0.86 on 01/11/17 CRRT on 2/9-2/15. -  Patient is due for hemodialysis again today. We may need to limit ultrafiltration as he is a bit hypotensive. Consider adding pressor therapy during  dialysis.  2.  Alcoholic cirrhosis, Anasarca/Edema, hypotension - 3rd spacing from low albumin,   - Continues to have anasarca overall. We will need to perform ultrafiltration with dialysis gently today if possible.  3. Secondary hyperparathyroidism.  Most recent phosphorus was 3.9. We plan to follow this up today.  4. Hypotension. Blood pressure lower today at 74/50. He has been administered IV fluid bolus. Consider adding pressor therapy to maintain a map of 65 or greater particularly during dialysis. For now continue the patient on midodrine 10 mg by mouth 3 times a day.    LOS: 14 Lessie Manigo 2/23/20189:56 AM

## 2017-02-04 NOTE — Progress Notes (Signed)
PRE DIALYSIS ASSESSMENT 

## 2017-02-04 NOTE — Progress Notes (Signed)
HD COMPLETED  

## 2017-02-04 NOTE — Progress Notes (Signed)
HD STARTED  

## 2017-02-04 NOTE — Care Management (Signed)
Patient back to ICU SDU today. [facility] is not in network with [employer] however they are sometimes able to do single case agreement with [employer] as patient requested [employer] [facility] for [facility]- which was denied- peer to peer done- denied. The next step if for patient to contact his insurance about denial and I will share this with patient if he is medically able to contact [employer]. Reference number for [employer] [id number].

## 2017-02-04 NOTE — Progress Notes (Signed)
PT Cancellation Note  Patient Details Name: [name] MRN: [id number] DOB: November 05, 1961   Cancelled Treatment:    Reason Eval/Treat Not Completed: Medical issues which prohibited therapy (Per chart review, patient transferred to CCU seondary to hypotension, not responsive to fluid bolus.  Supine BP currently 83/66.  Not appropriate for continued PT at this time.  Per policy, will require new orders to resume care; please re-consult as medically appropriate.)  Of note, patient currently with R IJ temporary dialysis catheter; no firm plans noted for transition to perm-cath at this time.  Will need clearance from nephrology or conversion to perm-cath prior to any OOB attempts.  Per RN, care management requesting patient OOB to chair as appropriate to evaluate ability to sit in chair for outpatient dialysis; therapist with concern over prolonged sitting and positioning given current stage IV sacral decubitus ulcer (will need to consider positioning and pressure-relieving surface as able).   Derica Leiber H. Manson PasseyBrown, PT, DPT, NCS 02/04/17, 12:08 PM [phone]

## 2017-02-05 LAB — GLUCOSE, CAPILLARY
GLUCOSE-CAPILLARY: 129 mg/dL — AB (ref 65–99)
GLUCOSE-CAPILLARY: 134 mg/dL — AB (ref 65–99)
Glucose-Capillary: 147 mg/dL — ABNORMAL HIGH (ref 65–99)
Glucose-Capillary: 107 mg/dL — ABNORMAL HIGH (ref 65–99)

## 2017-02-05 NOTE — Progress Notes (Signed)
Pt care assumed at this time.  Pt found laying in bed, NAD noted.  Denies need at this time.  Resp even and unlabored.

## 2017-02-05 NOTE — Progress Notes (Signed)
[facility]  ROUNDING NOTE   Subjective:  Patient completed hemodialysis yesterday. He remains oliguric at this point in time. Blood pressure overall still low, most recent blood pressure 100/77.  Objective:  Vital signs in last 24 hours:  Temp:  [97.6 F (36.4 C)-98.9 F (37.2 C)] 98.9 F (37.2 C) (02/24 0400) Pulse Rate:  [63-116] 91 (02/24 0600) Resp:  [9-21] 17 (02/24 0600) BP: (77-124)/(51-103) 100/77 (02/24 0600) SpO2:  [97 %-100 %] 100 % (02/24 0600) Weight:  [144.3 kg (318 lb 2 oz)-146.6 kg (323 lb 3.1 oz)] 146.6 kg (323 lb 3.1 oz) (02/24 0500)  Weight change: 2.768 kg (6 lb 1.7 oz) Filed Weights   02/04/17 0900 02/04/17 1615 02/05/17 0500  Weight: (!) 139.8 kg (308 lb 3.3 oz) (!) 144.3 kg (318 lb 2 oz) (!) 146.6 kg (323 lb 3.1 oz)    Intake/Output: I/O last 3 completed shifts: In: 2089.8 [P.O.:828; I.V.:344.8; Other:30; IV Piggyback:887] Out: 1575 [Urine:75; Other:1500]   Intake/Output this shift:  No intake/output data recorded.  Physical Exam: General: No acute distress  Head: Normocephalic, atraumatic. Moist oral mucosal membranes  Eyes: Anicteric  Neck: Supple  Lungs:  Clear to auscultation, normal effort  Heart: Regular rate and rhythm  Abdomen:  Soft, nontender, obese, distended   Extremities: 2+ dependent peripheral edema. Anasarca  Neurologic: Alert and oriented, following commands  Skin: Scattered bruising  Access: Right IJ temp HD catheter 2/9    Basic Metabolic Panel:  Recent Labs Lab 01/29/17 1311 01/30/17 0702 01/31/17 1020 02/01/17 0311 02/02/17 0855 02/03/17 0330 02/04/17 0500 02/04/17 1630  NA 134* 134* 133* 135  --  134* 135  --   K 4.0 3.9 3.5 3.5  --  3.7 3.7  --   CL 104 104 104 104  --  101 102  --   CO2 24 23 24 27   --  28 26  --   GLUCOSE 129* 130* 75 85  --  137* 114*  --   BUN 32* 40* 48* 29*  --  28* 44*  --   CREATININE 3.15* 3.72* 4.32* 3.25*  --  3.47* 4.57*  --   CALCIUM 8.8* 8.6* 8.5* 7.9*  --   7.8* 8.0*  --   PHOS 3.4  --  4.0  --  3.9  --   --  3.1    Liver Function Tests:  Recent Labs Lab 01/29/17 1311  ALBUMIN 2.5*   No results for input(s): LIPASE, AMYLASE in the last 168 hours. No results for input(s): AMMONIA in the last 168 hours.  CBC:  Recent Labs Lab 01/31/17 0454 02/01/17 0311 02/03/17 0330 02/04/17 0500 02/04/17 1630  WBC 12.2* 9.6 10.1 10.8* 11.0*  HGB 8.8* 9.2* 9.2* 8.1* 8.6*  HCT 26.8* 28.0* 27.2* 24.2* 25.9*  MCV 105.4* 107.1* 105.6* 106.8* 106.6*  PLT 70* 63* 65* 68* 78*    Cardiac Enzymes: No results for input(s): CKTOTAL, CKMB, CKMBINDEX, TROPONINI in the last 168 hours.  BNP: Invalid input(s): POCBNP  CBG:  Recent Labs Lab 02/04/17 0849 02/04/17 1142 02/04/17 1749 02/04/17 2056 02/05/17 0733  GLUCAP 127* 77 162* 146* 129*    Microbiology: Results for orders placed or performed during the hospital encounter of 01/15/2017  Blood Culture (routine x 2)     Status: None   Collection Time: 02/03/2017 10:27 AM  Result Value Ref Range Status   Specimen Description BLOOD LEFT ARM  Final   Special Requests BOTTLES DRAWN AEROBIC AND ANAEROBIC ANA2ML AER3ML  Final   Culture NO GROWTH 5 DAYS  Final   Report Status 01/26/2017 FINAL  Final  C difficile quick scan w PCR reflex     Status: None   Collection Time: 01/19/2017 11:28 AM  Result Value Ref Range Status   C Diff antigen NEGATIVE NEGATIVE Final   C Diff toxin NEGATIVE NEGATIVE Final   C Diff interpretation No C. difficile detected.  Final  Gastrointestinal Panel by PCR , Stool     Status: None   Collection Time: 02/04/2017 11:28 AM  Result Value Ref Range Status   Campylobacter species NOT DETECTED NOT DETECTED Final   Plesimonas shigelloides NOT DETECTED NOT DETECTED Final   Salmonella species NOT DETECTED NOT DETECTED Final   Yersinia enterocolitica NOT DETECTED NOT DETECTED Final   Vibrio species NOT DETECTED NOT DETECTED Final   Vibrio cholerae NOT DETECTED NOT DETECTED Final    Enteroaggregative E coli (EAEC) NOT DETECTED NOT DETECTED Final   Enteropathogenic E coli (EPEC) NOT DETECTED NOT DETECTED Final   Enterotoxigenic E coli (ETEC) NOT DETECTED NOT DETECTED Final   Shiga like toxin producing E coli (STEC) NOT DETECTED NOT DETECTED Final   Shigella/Enteroinvasive E coli (EIEC) NOT DETECTED NOT DETECTED Final   Cryptosporidium NOT DETECTED NOT DETECTED Final   Cyclospora cayetanensis NOT DETECTED NOT DETECTED Final   Entamoeba histolytica NOT DETECTED NOT DETECTED Final   Giardia lamblia NOT DETECTED NOT DETECTED Final   Adenovirus F40/41 NOT DETECTED NOT DETECTED Final   Astrovirus NOT DETECTED NOT DETECTED Final   Norovirus GI/GII NOT DETECTED NOT DETECTED Final   Rotavirus A NOT DETECTED NOT DETECTED Final   Sapovirus (I, II, IV, and V) NOT DETECTED NOT DETECTED Final  Blood Culture (routine x 2)     Status: None   Collection Time: 01/19/2017 11:28 AM  Result Value Ref Range Status   Specimen Description BLOOD RIGHT PICC  Final   Special Requests BOTTLES DRAWN AEROBIC AND ANAEROBIC ANA7ML AER9ML  Final   Culture NO GROWTH 5 DAYS  Final   Report Status 01/26/2017 FINAL  Final  Urine culture     Status: None   Collection Time: 01/29/2017 12:10 PM  Result Value Ref Range Status   Specimen Description URINE, RANDOM  Final   Special Requests NONE  Final   Culture   Final    NO GROWTH Performed at [facility], [address], [place], [place] [address]    Report Status 01/22/2017 FINAL  Final  MRSA PCR Screening     Status: None   Collection Time: 01/24/2017  6:07 PM  Result Value Ref Range Status   MRSA by PCR NEGATIVE NEGATIVE Final    Comment:        The GeneXpert MRSA Assay (FDA approved for NASAL specimens only), is one component of a comprehensive MRSA colonization surveillance program. It is not intended to diagnose MRSA infection nor to guide or monitor treatment for MRSA infections.   C difficile quick scan w PCR reflex     Status:  None   Collection Time: 02/03/17  4:26 PM  Result Value Ref Range Status   C Diff antigen NEGATIVE NEGATIVE Final   C Diff toxin NEGATIVE NEGATIVE Final   C Diff interpretation No C. difficile detected.  Final    Coagulation Studies: No results for input(s): LABPROT, INR in the last 72 hours.  Urinalysis: No results for input(s): COLORURINE, LABSPEC, PHURINE, GLUCOSEU, HGBUR, BILIRUBINUR, KETONESUR, PROTEINUR, UROBILINOGEN, NITRITE, LEUKOCYTESUR in the  last 72 hours.  Invalid input(s): APPERANCEUR    Imaging: No results found.   Medications:   . norepinephrine (LEVOPHED) Adult infusion 3 mcg/min (02/05/17 0825)   . cefTRIAXone (ROCEPHIN) IVPB 2 gram/50 mL D5W (Pyxis)  2 g Intravenous Q24H  . feeding supplement (ENSURE ENLIVE)  237 mL Oral TID BM  . feeding supplement (PRO-STAT SUGAR FREE 64)  30 mL Oral BID  . folic acid  1 mg Oral Daily  . hydrocerin   Topical BID  . insulin aspart  0-9 Units Subcutaneous TID WC  . mouth rinse  15 mL Mouth Rinse BID  . metroNIDAZOLE  500 mg Oral Q8H  . midodrine  10 mg Oral TID WC  . pantoprazole  40 mg Oral BID AC  . sodium chloride flush  10-40 mL Intracatheter Q12H  . sucralfate  1 g Oral TID WC & HS   sodium chloride, ipratropium-albuterol, loperamide, ondansetron (ZOFRAN) IV, sodium chloride flush, zolpidem  Assessment/ Plan:  Mr. [name] RouteMichael J [name] is a 56 y.o. white male with decubitus ulcer, hypertension, congestive heart failure, atrial fibrillation, Alcoholic cirrhosis, depression who was admitted to [facility] from 1/14 to 2/2 for sepsis. He was discharged with metronidazole and ceftriaxone for a total of six weeks. [facility] on [facility]2017 with sepsis.   1. Acute renal failure:  Oliguric. Off vasopressors. Baseline creatinine of 0.86 on 01/11/17 CRRT on 2/9-2/15. -  Patient due for hemodialysis again on Monday.  He completed hemodialysis yesterday.  2.  Alcoholic cirrhosis, Anasarca/Edema - 3rd spacing from low  albumin  - Last albumin was low at 2.5. This is leading to third spacing of fluid. Continue supportive care.  3. Secondary hyperparathyroidism.  Continue to monitor phos.   4. Hypotension. Blood pressure improved today at 100/77.  Continue norepinephrine as well as midodrine at the moment.    LOS: 15 Nathasha Fiorillo 2/24/20189:31 AM

## 2017-02-05 NOTE — Progress Notes (Signed)
[employer] [facility] [place] [facility]   PATIENT NAME: [name]    MR#:  [id number]  DATE OF BIRTH:  1960/12/26  SUBJECTIVE:   Weakness and poor oral intake.  On levophed drip.  REVIEW OF SYSTEMS:    Review of Systems  Constitutional: Positive for malaise/fatigue. Negative for chills and fever.  HENT: Negative.  Negative for ear discharge, ear pain, hearing loss, nosebleeds and sore throat.   Eyes: Negative.  Negative for blurred vision and pain.  Respiratory: Negative for cough, hemoptysis, shortness of breath and wheezing.   Cardiovascular: Positive for leg swelling. Negative for chest pain and palpitations.  Gastrointestinal: Negative.  Negative for abdominal pain, blood in stool, diarrhea, nausea and vomiting.  Genitourinary: Negative for dysuria.  Musculoskeletal: Negative.  Negative for back pain.  Skin: Negative.   Neurological: Positive for weakness. Negative for dizziness, tremors, speech change, focal weakness, seizures and headaches.  Endo/Heme/Allergies: Negative.  Does not bruise/bleed easily.  Psychiatric/Behavioral: Negative.  Negative for depression, hallucinations and suicidal ideas.    Tolerating Diet: yes   DRUG ALLERGIES:   Allergies  Allergen Reactions  . Clindamycin/Lincomycin Diarrhea    VITALS:  Blood pressure 100/77, pulse 91, temperature 98.9 F (37.2 C), resp. rate 17, height 6\' 4"  (1.93 m), weight (!) 323 lb 3.1 oz (146.6 kg), SpO2 100 %.  PHYSICAL EXAMINATION:   Physical Exam  Constitutional: He is oriented to person, place, and time and well-developed, well-nourished, and in no distress. No distress.  Anasarca, morbid obese.  HENT:  Head: Normocephalic.  Right IJ and PICC  Eyes: No scleral icterus.  Neck: Normal range of motion. Neck supple. No JVD present. No tracheal deviation present.  Cardiovascular: Normal rate, regular rhythm and normal heart sounds.  Exam reveals no gallop and no friction rub.   No murmur  heard. Pulmonary/Chest: Effort normal and breath sounds normal. No respiratory distress. He has no wheezes. He has no rales. He exhibits no tenderness.  Abdominal: Soft. Bowel sounds are normal. He exhibits distension. He exhibits no mass. There is no tenderness. There is no rebound and no guarding.  Musculoskeletal: Normal range of motion. He exhibits edema.  Neurological: He is alert and oriented to person, place, and time.  Bilateral edema with chronic skin changes.  Skin: Skin is warm. No rash noted. No erythema.  Psychiatric: Affect and judgment normal.      LABORATORY PANEL:   CBC  Recent Labs Lab 02/04/17 1630  WBC 11.0*  HGB 8.6*  HCT 25.9*  PLT 78*   ------------------------------------------------------------------------------------------------------------------  Chemistries   Recent Labs Lab 02/04/17 0500  NA 135  K 3.7  CL 102  CO2 26  GLUCOSE 114*  BUN 44*  CREATININE 4.57*  CALCIUM 8.0*   ------------------------------------------------------------------------------------------------------------------  Cardiac Enzymes No results for input(s): TROPONINI in the last 168 hours. ------------------------------------------------------------------------------------------------------------------  RADIOLOGY:  No results found.   ASSESSMENT AND PLAN:    56 year old male with EtOH related liver cirrhosis and portal gastropathy who was recently discharged for sacral decubitus presents with septic shock due to infected sacral decubitus and acute kidney failure due to sepsis.   1. Acute hypoxic respiratory failure in the setting of pulmonary vascular congestion due to renal failure Resolved but hypoxia again, try to wean off O2 [place].  2. Acute kidney failure with anuria received CRT from February 9 - 15th. Renal failure unfortunately persists. If his acute renal failure is prolonged he will likely need treatment and outpatient dialysis center per Dr. Cherylann Ratel.  Continue HD.  3. EtOH liver cirrhosis with anasarca and portal hypertensive gastropathy with slow GI bleed: Continue  PPI and Carafate. FOTB positive. Octreotide stopped.  hemoglobin down to 8.6 today.  4. Chronic thrombocytopenia due to problem #3, stable.  5. Septic shock from infected sacral decubitus (d/c from hospital on 2/9 with CTX and flagyl): Continue ceftriaxone and Flagyl as per ID recommendations   Hypotension. Try to wean off levophed drip.  Continue midodrine.    LTAC disposition is declined by insurance per SW and CM. Management plans discussed with the patient and he is in agreement.  CODE STATUS: LIMITED  TOTAL CRITICAL TIME TAKING CARE OF THIS PATIENT: 37 minutes.   Shaune Pollackhen, Keontay Vora M.D on 02/05/2017 at 8:39 AM  Between 7am to 6pm - Pager - [phone] After 6pm go to [email or web address] - password [email or web address]  [facility]  Office  [phone]  CC: Primary care physician; Corky DownsMASOUD,[name], MD  Note: This dictation was prepared with Dragon dictation along with smaller phrase technology. Any transcriptional errors that result from this process are unintentional.

## 2017-02-05 NOTE — Consult Note (Signed)
Vascular Surgery  Patient has temporary HD catheter that has been working well. Once patient stable, off pressors, no longer septic will reassess for long term permcath for HD prior to discharge.

## 2017-02-05 NOTE — Progress Notes (Signed)
Attempted to stop patients Levophed gtt, however patients MAP dropped below 5150mm/hg for consecutive blood pressures.  Levophed was restarted at 2mcg.

## 2017-02-06 LAB — GLUCOSE, CAPILLARY
GLUCOSE-CAPILLARY: 122 mg/dL — AB (ref 65–99)
GLUCOSE-CAPILLARY: 146 mg/dL — AB (ref 65–99)
Glucose-Capillary: 117 mg/dL — ABNORMAL HIGH (ref 65–99)
Glucose-Capillary: 178 mg/dL — ABNORMAL HIGH (ref 65–99)

## 2017-02-06 LAB — RENAL FUNCTION PANEL
ALBUMIN: 2.1 g/dL — AB (ref 3.5–5.0)
ALBUMIN: 2.2 g/dL — AB (ref 3.5–5.0)
Albumin: 2.1 g/dL — ABNORMAL LOW (ref 3.5–5.0)
Anion gap: 6 (ref 5–15)
Anion gap: 6 (ref 5–15)
Anion gap: 6 (ref 5–15)
BUN: 31 mg/dL — AB (ref 6–20)
BUN: 30 mg/dL — AB (ref 6–20)
BUN: 27 mg/dL — ABNORMAL HIGH (ref 6–20)
CALCIUM: 8.3 mg/dL — AB (ref 8.9–10.3)
CALCIUM: 8 mg/dL — AB (ref 8.9–10.3)
CHLORIDE: 100 mmol/L — AB (ref 101–111)
CO2: 27 mmol/L (ref 22–32)
CO2: 27 mmol/L (ref 22–32)
CO2: 26 mmol/L (ref 22–32)
CREATININE: 3.61 mg/dL — AB (ref 0.61–1.24)
CREATININE: 3.32 mg/dL — AB (ref 0.61–1.24)
Calcium: 8 mg/dL — ABNORMAL LOW (ref 8.9–10.3)
Chloride: 99 mmol/L — ABNORMAL LOW (ref 101–111)
Chloride: 100 mmol/L — ABNORMAL LOW (ref 101–111)
Creatinine, Ser: 3.62 mg/dL — ABNORMAL HIGH (ref 0.61–1.24)
GFR calc Af Amer: 20 mL/min — ABNORMAL LOW (ref >60)
GFR calc Af Amer: 20 mL/min — ABNORMAL LOW (ref >60)
GFR calc non Af Amer: 19 mL/min — ABNORMAL LOW (ref >60)
GFR, EST AFRICAN AMERICAN: 22 mL/min — AB (ref >60)
GFR, EST NON AFRICAN AMERICAN: 17 mL/min — AB (ref >60)
GFR, EST NON AFRICAN AMERICAN: 17 mL/min — AB (ref >60)
GLUCOSE: 161 mg/dL — AB (ref 65–99)
GLUCOSE: 190 mg/dL — AB (ref 65–99)
Glucose, Bld: 175 mg/dL — ABNORMAL HIGH (ref 65–99)
PHOSPHORUS: 2.8 mg/dL (ref 2.5–4.6)
POTASSIUM: 3.5 mmol/L (ref 3.5–5.1)
Phosphorus: 3.1 mg/dL (ref 2.5–4.6)
Phosphorus: 2.6 mg/dL (ref 2.5–4.6)
Potassium: 3.5 mmol/L (ref 3.5–5.1)
Potassium: 3.3 mmol/L — ABNORMAL LOW (ref 3.5–5.1)
SODIUM: 132 mmol/L — AB (ref 135–145)
SODIUM: 133 mmol/L — AB (ref 135–145)
SODIUM: 132 mmol/L — AB (ref 135–145)

## 2017-02-06 LAB — CBC
HCT: 25.8 % — ABNORMAL LOW (ref 40.0–52.0)
Hemoglobin: 8.8 g/dL — ABNORMAL LOW (ref 13.0–18.0)
MCH: 35.7 pg — ABNORMAL HIGH (ref 26.0–34.0)
MCHC: 34 g/dL (ref 32.0–36.0)
MCV: 105.1 fL — AB (ref 80.0–100.0)
PLATELETS: 66 10*3/uL — AB (ref 150–440)
RBC: 2.46 MIL/uL — AB (ref 4.40–5.90)
RDW: 19.8 % — AB (ref 11.5–14.5)
WBC: 10.4 10*3/uL (ref 3.8–10.6)

## 2017-02-06 MED ORDER — DEXTROSE 5 % IV SOLN
0.0000 ug/min | INTRAVENOUS | Status: DC
  Administered 2017-02-07: 4 ug/min via INTRAVENOUS
  Administered 2017-02-08: 20 ug/min via INTRAVENOUS
  Administered 2017-02-08: 35 ug/min via INTRAVENOUS
  Administered 2017-02-09: 40 ug/min via INTRAVENOUS
  Filled 2017-02-06 (×6): qty 16

## 2017-02-06 MED ORDER — PUREFLOW DIALYSIS SOLUTION
INTRAVENOUS | Status: DC
  Administered 2017-02-06: 13:00:00 via INTRAVENOUS_CENTRAL
  Administered 2017-02-06 – 2017-02-07 (×2): 500 via INTRAVENOUS_CENTRAL
  Administered 2017-02-07: 20:00:00 via INTRAVENOUS_CENTRAL
  Administered 2017-02-08: 3 via INTRAVENOUS_CENTRAL
  Administered 2017-02-08 (×4): via INTRAVENOUS_CENTRAL
  Administered 2017-02-09: 3 via INTRAVENOUS_CENTRAL

## 2017-02-06 MED ORDER — HEPARIN SODIUM (PORCINE) 1000 UNIT/ML DIALYSIS: 1000.0000 [IU] | INTRAMUSCULAR | Status: DC | PRN

## 2017-02-06 NOTE — Progress Notes (Signed)
CRRT started per MD orders.

## 2017-02-06 NOTE — Progress Notes (Signed)
[employer] [facility] - [place] at [facility]   PATIENT NAME: [name]    MR#:  [id number]  DATE OF BIRTH:  [birth year]  SUBJECTIVE:   Weakness and better oral intake.  Unable to wean off levophed drip. Poor urine output.  REVIEW OF SYSTEMS:    Review of Systems  Constitutional: Positive for malaise/fatigue. Negative for chills and fever.  HENT: Negative.  Negative for ear discharge, ear pain, hearing loss, nosebleeds and sore throat.   Eyes: Negative.  Negative for blurred vision and pain.  Respiratory: Negative for cough, hemoptysis, shortness of breath and wheezing.   Cardiovascular: Positive for leg swelling. Negative for chest pain and palpitations.  Gastrointestinal: Negative.  Negative for abdominal pain, blood in stool, diarrhea, nausea and vomiting.  Genitourinary: Negative for dysuria.  Musculoskeletal: Negative.  Negative for back pain.  Skin: Negative.   Neurological: Positive for weakness. Negative for dizziness, tremors, speech change, focal weakness, seizures and headaches.  Endo/Heme/Allergies: Negative.  Does not bruise/bleed easily.  Psychiatric/Behavioral: Negative.  Negative for depression, hallucinations and suicidal ideas.    Tolerating Diet: yes   DRUG ALLERGIES:   Allergies  Allergen Reactions  . Clindamycin/Lincomycin Diarrhea    VITALS:  Blood pressure (!) 80/65, pulse 97, temperature 98.3 F (36.8 C), resp. rate 15, height 6\' 4"  (1.93 m), weight (!) 309 lb 1.4 oz (140.2 kg), SpO2 100 %.  PHYSICAL EXAMINATION:   Physical Exam  Constitutional: He is oriented to person, place, and time and well-developed, well-nourished, and in no distress. No distress.  Anasarca, morbid obese.  HENT:  Head: Normocephalic.  Right IJ and PICC  Eyes: No scleral icterus.  Neck: Normal range of motion. Neck supple. No JVD present. No tracheal deviation present.  Cardiovascular: Normal rate, regular rhythm and normal heart sounds.  Exam reveals no  gallop and no friction rub.   No murmur heard. Pulmonary/Chest: Effort normal and breath sounds normal. No respiratory distress. He has no wheezes. He has no rales. He exhibits no tenderness.  Abdominal: Soft. Bowel sounds are normal. He exhibits distension. He exhibits no mass. There is no tenderness. There is no rebound and no guarding.  Musculoskeletal: Normal range of motion. He exhibits edema.  Neurological: He is alert and oriented to person, place, and time.  Bilateral edema with chronic skin changes.  Skin: Skin is warm. No rash noted. No erythema.  Psychiatric: Affect and judgment normal.      LABORATORY PANEL:   CBC  Recent Labs Lab 02/06/17 0616  WBC 10.4  HGB 8.8*  HCT 25.8*  PLT 66*   ------------------------------------------------------------------------------------------------------------------  Chemistries   Recent Labs Lab 02/04/17 0500  NA 135  K 3.7  CL 102  CO2 26  GLUCOSE 114*  BUN 44*  CREATININE 4.57*  CALCIUM 8.0*   ------------------------------------------------------------------------------------------------------------------  Cardiac Enzymes No results for input(s): TROPONINI in the last 168 hours. ------------------------------------------------------------------------------------------------------------------  RADIOLOGY:  No results found.   ASSESSMENT AND PLAN:    56 year old male with EtOH related liver cirrhosis and portal gastropathy who was recently discharged for sacral decubitus presents with septic shock due to infected sacral decubitus and acute kidney failure due to sepsis.   1. Acute hypoxic respiratory failure in the setting of pulmonary vascular congestion due to renal failure Resolved but hypoxia again, try to wean off O2 [place].  2. Acute kidney failure with anuria received CRT from February 9 - 15th. Renal failure unfortunately persists. If his acute renal failure is prolonged he will likely need treatment  and  outpatient dialysis center per Dr. Cherylann RatelLateef. Continue HD tomorrow.  3. EtOH liver cirrhosis with anasarca and portal hypertensive gastropathy with slow GI bleed: Continue  PPI and Carafate. FOTB positive. Octreotide stopped.  hemoglobin down to 8.8 today.  4. Chronic thrombocytopenia due to problem #3, stable.  5. Septic shock from infected sacral decubitus (d/c from hospital on 2/9 with CTX and flagyl): Continue ceftriaxone and Flagyl as per ID recommendations   Hypotension. Try to wean off levophed drip.  Continue midodrine.    LTAC disposition is declined by insurance per SW and CM. Management plans discussed with the patient and he is in agreement.  CODE STATUS: LIMITED  TOTAL CRITICAL TIME TAKING CARE OF THIS PATIENT: 37 minutes.   Shaune Pollackhen, Chelsey Kimberley M.D on 02/06/2017 at 10:01 AM  Between 7am to 6pm - Pager - [phone] After 6pm go to [email or web address] - password [facility]  [facility] [place] [facility]  Office  [phone]  CC: Primary care physician; Corky DownsMASOUD,[name], MD  Note: This dictation was prepared with Dragon dictation along with smaller phrase technology. Any transcriptional errors that result from this process are unintentional.

## 2017-02-06 NOTE — Progress Notes (Signed)
[facility]  ROUNDING NOTE   Subjective:  Patient continues to have very low blood pressure. Blood pressure this a.m. was 80/65. Next line he continues to require pressors. We have been unable to remove significant amounts of volume given his underlying hypotension. This was discussed with nursing and we will plan to transition the patient to CRRT this a.m.  Objective:  Vital signs in last 24 hours:  Temp:  [98.3 F (36.8 C)-98.8 F (37.1 C)] 98.3 F (36.8 C) (02/25 0400) Pulse Rate:  [77-115] 97 (02/25 0500) Resp:  [9-22] 15 (02/25 0500) BP: (65-152)/(41-127) 80/65 (02/25 0500) SpO2:  [96 %-100 %] 100 % (02/25 0500) Weight:  [140.2 kg (309 lb 1.4 oz)] 140.2 kg (309 lb 1.4 oz) (02/25 0418)  Weight change: 0.4 kg (14.1 oz) Filed Weights   02/04/17 1615 02/05/17 0500 02/06/17 0418  Weight: (!) 144.3 kg (318 lb 2 oz) (!) 146.6 kg (323 lb 3.1 oz) (!) 140.2 kg (309 lb 1.4 oz)    Intake/Output: I/O last 3 completed shifts: In: 245.1 [I.V.:245.1] Out: 1500 [Other:1500]   Intake/Output this shift:  Total I/O In: 240 [P.O.:240] Out: -   Physical Exam: General: No acute distress  Head: Normocephalic, atraumatic. Moist oral mucosal membranes  Eyes: Anicteric  Neck: Supple  Lungs:  Clear to auscultation, normal effort  Heart: Regular rate and rhythm  Abdomen:  Soft, nontender, obese, distended   Extremities: 3+ dependent peripheral edema. Anasarca  Neurologic: Alert and oriented, following commands  Skin: Scattered bruising  Access: Right IJ temp HD catheter 2/9    Basic Metabolic Panel:  Recent Labs Lab 01/31/17 1020 02/01/17 0311 02/02/17 0855 02/03/17 0330 02/04/17 0500 02/04/17 1630  NA 133* 135  --  134* 135  --   K 3.5 3.5  --  3.7 3.7  --   CL 104 104  --  101 102  --   CO2 24 27  --  28 26  --   GLUCOSE 75 85  --  137* 114*  --   BUN 48* 29*  --  28* 44*  --   CREATININE 4.32* 3.25*  --  3.47* 4.57*  --   CALCIUM 8.5* 7.9*  --  7.8* 8.0*   --   PHOS 4.0  --  3.9  --   --  3.1    Liver Function Tests: No results for input(s): AST, ALT, ALKPHOS, BILITOT, PROT, ALBUMIN in the last 168 hours. No results for input(s): LIPASE, AMYLASE in the last 168 hours. No results for input(s): AMMONIA in the last 168 hours.  CBC:  Recent Labs Lab 02/01/17 0311 02/03/17 0330 02/04/17 0500 02/04/17 1630 02/06/17 0616  WBC 9.6 10.1 10.8* 11.0* 10.4  HGB 9.2* 9.2* 8.1* 8.6* 8.8*  HCT 28.0* 27.2* 24.2* 25.9* 25.8*  MCV 107.1* 105.6* 106.8* 106.6* 105.1*  PLT 63* 65* 68* 78* 66*    Cardiac Enzymes: No results for input(s): CKTOTAL, CKMB, CKMBINDEX, TROPONINI in the last 168 hours.  BNP: Invalid input(s): POCBNP  CBG:  Recent Labs Lab 02/05/17 0733 02/05/17 1151 02/05/17 1610 02/05/17 2123 02/06/17 0759  GLUCAP 129* 147* 107* 134* 122*    Microbiology: Results for orders placed or performed during the hospital encounter of 01/29/2017  Blood Culture (routine x 2)     Status: None   Collection Time: 01/26/2017 10:27 AM  Result Value Ref Range Status   Specimen Description BLOOD LEFT ARM  Final   Special Requests BOTTLES DRAWN AEROBIC AND ANAEROBIC ANA2ML  AER3ML  Final   Culture NO GROWTH 5 DAYS  Final   Report Status 01/26/2017 FINAL  Final  C difficile quick scan w PCR reflex     Status: None   Collection Time: 02-15-2017 11:28 AM  Result Value Ref Range Status   C Diff antigen NEGATIVE NEGATIVE Final   C Diff toxin NEGATIVE NEGATIVE Final   C Diff interpretation No C. difficile detected.  Final  Gastrointestinal Panel by PCR , Stool     Status: None   Collection Time: 02-15-2017 11:28 AM  Result Value Ref Range Status   Campylobacter species NOT DETECTED NOT DETECTED Final   Plesimonas shigelloides NOT DETECTED NOT DETECTED Final   Salmonella species NOT DETECTED NOT DETECTED Final   Yersinia enterocolitica NOT DETECTED NOT DETECTED Final   Vibrio species NOT DETECTED NOT DETECTED Final   Vibrio cholerae NOT DETECTED  NOT DETECTED Final   Enteroaggregative E coli (EAEC) NOT DETECTED NOT DETECTED Final   Enteropathogenic E coli (EPEC) NOT DETECTED NOT DETECTED Final   Enterotoxigenic E coli (ETEC) NOT DETECTED NOT DETECTED Final   Shiga like toxin producing E coli (STEC) NOT DETECTED NOT DETECTED Final   Shigella/Enteroinvasive E coli (EIEC) NOT DETECTED NOT DETECTED Final   Cryptosporidium NOT DETECTED NOT DETECTED Final   Cyclospora cayetanensis NOT DETECTED NOT DETECTED Final   Entamoeba histolytica NOT DETECTED NOT DETECTED Final   Giardia lamblia NOT DETECTED NOT DETECTED Final   Adenovirus F40/41 NOT DETECTED NOT DETECTED Final   Astrovirus NOT DETECTED NOT DETECTED Final   Norovirus GI/GII NOT DETECTED NOT DETECTED Final   Rotavirus A NOT DETECTED NOT DETECTED Final   Sapovirus (I, II, IV, and V) NOT DETECTED NOT DETECTED Final  Blood Culture (routine x 2)     Status: None   Collection Time: 2017/02/15 11:28 AM  Result Value Ref Range Status   Specimen Description BLOOD RIGHT PICC  Final   Special Requests BOTTLES DRAWN AEROBIC AND ANAEROBIC ANA7ML AER9ML  Final   Culture NO GROWTH 5 DAYS  Final   Report Status 01/26/2017 FINAL  Final  Urine culture     Status: None   Collection Time: 2017-02-15 12:10 PM  Result Value Ref Range Status   Specimen Description URINE, RANDOM  Final   Special Requests NONE  Final   Culture   Final    NO GROWTH Performed at [facility], [address] [place], [place] [address]    Report Status 01/22/2017 FINAL  Final  MRSA PCR Screening     Status: None   Collection Time: 2017/02/15  6:07 PM  Result Value Ref Range Status   MRSA by PCR NEGATIVE NEGATIVE Final    Comment:        The GeneXpert MRSA Assay (FDA approved for NASAL specimens only), is one component of a comprehensive MRSA colonization surveillance program. It is not intended to diagnose MRSA infection nor to guide or monitor treatment for MRSA infections.   C difficile quick scan w PCR  reflex     Status: None   Collection Time: 02/03/17  4:26 PM  Result Value Ref Range Status   C Diff antigen NEGATIVE NEGATIVE Final   C Diff toxin NEGATIVE NEGATIVE Final   C Diff interpretation No C. difficile detected.  Final    Coagulation Studies: No results for input(s): LABPROT, INR in the last 72 hours.  Urinalysis: No results for input(s): COLORURINE, LABSPEC, PHURINE, GLUCOSEU, HGBUR, BILIRUBINUR, KETONESUR, PROTEINUR, UROBILINOGEN, NITRITE, LEUKOCYTESUR  in the last 72 hours.  Invalid input(s): APPERANCEUR    Imaging: No results found.   Medications:   . norepinephrine (LEVOPHED) Adult infusion 5 mcg/min (02/06/17 0601)  . pureflow     . cefTRIAXone (ROCEPHIN) IVPB 2 gram/50 mL D5W (Pyxis)  2 g Intravenous Q24H  . feeding supplement (ENSURE ENLIVE)  237 mL Oral TID BM  . feeding supplement (PRO-STAT SUGAR FREE 64)  30 mL Oral BID  . folic acid  1 mg Oral Daily  . hydrocerin   Topical BID  . insulin aspart  0-9 Units Subcutaneous TID WC  . mouth rinse  15 mL Mouth Rinse BID  . metroNIDAZOLE  500 mg Oral Q8H  . midodrine  10 mg Oral TID WC  . pantoprazole  40 mg Oral BID AC  . sodium chloride flush  10-40 mL Intracatheter Q12H  . sucralfate  1 g Oral TID WC & HS   sodium chloride, heparin, ipratropium-albuterol, loperamide, ondansetron (ZOFRAN) IV, sodium chloride flush, zolpidem  Assessment/ Plan:  Mr. [name] RouteMichael J Ferris is a 56 y.o. white male with decubitus ulcer, hypertension, congestive heart failure, atrial fibrillation, Alcoholic cirrhosis, depression who [facility] to 2/2 for sepsis. He was discharged with metronidazole and ceftriaxone for a total of six weeks. [facility] with sepsis.   1. Acute renal failure:  Baseline creatinine of 0.86 on 01/11/17 CRRT on 2/9-2/15, followed by IHD.  Back on CRRT 02/06/17.  -  Over the past week we have been performing intermittent hemodialysis. Unfortunately the patient  continues to have significant anasarca. As such we will transition the patient back to continuous renal placement therapy which he was on previously. We will plan for ultrafiltration target of 75 cc per hour with this therapy. With this can be increased. We may need to increase pressors therapy as tolerated.  2.  Alcoholic cirrhosis, Anasarca/Edema - 3rd spacing from low albumin  - As above we are starting the patient back on CRRT.  3. Secondary hyperparathyroidism.  Phosphorus 3.1 and acceptable yesterday. Continue to periodically monitor.  4. Hypotension. Patient is still requiring levo fed to maintain his blood pressure. We may need to increase this during continuous renal placement therapy as above.    LOS: 16 Milee Qualls 2/25/201810:31 AM

## 2017-02-06 NOTE — Progress Notes (Signed)
MEDICATION RELATED CONSULT NOTE - INITIAL   Pharmacy Consult for daily review of medications for adjustment for CRRT Indication: CVVHD  Allergies  Allergen Reactions  . Clindamycin/Lincomycin Diarrhea    Patient Measurements: Height: 6\' 4"  (193 cm) Weight: (!) 309 lb 1.4 oz (140.2 kg) IBW/kg (Calculated) : 86.8  Vital Signs: Temp: 98.3 F (36.8 C) (02/25 0400) BP: 80/65 (02/25 0500) Pulse Rate: 97 (02/25 0500) Intake/Output from previous day: No intake/output data recorded. Intake/Output from this shift: Total I/O In: 240 [P.O.:240] Out: -   Labs:  Recent Labs  02/04/17 0500 02/04/17 1630 02/06/17 0616  WBC 10.8* 11.0* 10.4  HGB 8.1* 8.6* 8.8*  HCT 24.2* 25.9* 25.8*  PLT 68* 78* 66*  CREATININE 4.57*  --   --   PHOS  --  3.1  --    Estimated Creatinine Clearance: 27.6 mL/min (by C-G formula based on SCr of 4.57 mg/dL (H)).  Medications:  Scheduled:  . cefTRIAXone (ROCEPHIN) IVPB 2 gram/50 mL D5W (Pyxis)  2 g Intravenous Q24H  . feeding supplement (ENSURE ENLIVE)  237 mL Oral TID BM  . feeding supplement (PRO-STAT SUGAR FREE 64)  30 mL Oral BID  . folic acid  1 mg Oral Daily  . hydrocerin   Topical BID  . insulin aspart  0-9 Units Subcutaneous TID WC  . mouth rinse  15 mL Mouth Rinse BID  . metroNIDAZOLE  500 mg Oral Q8H  . midodrine  10 mg Oral TID WC  . pantoprazole  40 mg Oral BID AC  . sodium chloride flush  10-40 mL Intracatheter Q12H  . sucralfate  1 g Oral TID WC & HS   Infusions:  . norepinephrine (LEVOPHED) Adult infusion 5 mcg/min (02/06/17 0601)  . pureflow     PRN: sodium chloride, heparin, ipratropium-albuterol, loperamide, ondansetron (ZOFRAN) IV, sodium chloride flush, zolpidem  Assessment: Pharmacy consulted to monitor medications daily to assess for the need to renally adjust while on CRRT. Patient was receiving intermittent HD, but CVVHD will be started today.  Plan:  At this time, no medications require dose  adjustment.  Metronidazole can be dose as 500 mg q 6-12 hours while on CRRT, recommend continuing current dose and monitoring for clinical response to therapy.  Cindi CarbonMary M Jordani Nunn, PharmD Clinical Pharmacist 02/06/2017,10:12 AM

## 2017-02-07 LAB — RENAL FUNCTION PANEL
ALBUMIN: 1.9 g/dL — AB (ref 3.5–5.0)
ALBUMIN: 2.1 g/dL — AB (ref 3.5–5.0)
ALBUMIN: 2.1 g/dL — AB (ref 3.5–5.0)
ANION GAP: 3 — AB (ref 5–15)
ANION GAP: 5 (ref 5–15)
Albumin: 2 g/dL — ABNORMAL LOW (ref 3.5–5.0)
Albumin: 2.1 g/dL — ABNORMAL LOW (ref 3.5–5.0)
Albumin: 2.4 g/dL — ABNORMAL LOW (ref 3.5–5.0)
Anion gap: 6 (ref 5–15)
Anion gap: 7 (ref 5–15)
Anion gap: 5 (ref 5–15)
Anion gap: 5 (ref 5–15)
BUN: 25 mg/dL — ABNORMAL HIGH (ref 6–20)
BUN: 24 mg/dL — ABNORMAL HIGH (ref 6–20)
BUN: 23 mg/dL — AB (ref 6–20)
BUN: 22 mg/dL — AB (ref 6–20)
BUN: 22 mg/dL — AB (ref 6–20)
BUN: 22 mg/dL — ABNORMAL HIGH (ref 6–20)
CALCIUM: 8 mg/dL — AB (ref 8.9–10.3)
CALCIUM: 8.2 mg/dL — AB (ref 8.9–10.3)
CALCIUM: 7.8 mg/dL — AB (ref 8.9–10.3)
CALCIUM: 8.1 mg/dL — AB (ref 8.9–10.3)
CHLORIDE: 99 mmol/L — AB (ref 101–111)
CO2: 26 mmol/L (ref 22–32)
CO2: 28 mmol/L (ref 22–32)
CO2: 27 mmol/L (ref 22–32)
CO2: 26 mmol/L (ref 22–32)
CO2: 26 mmol/L (ref 22–32)
CO2: 26 mmol/L (ref 22–32)
CREATININE: 2.91 mg/dL — AB (ref 0.61–1.24)
CREATININE: 2.76 mg/dL — AB (ref 0.61–1.24)
Calcium: 8 mg/dL — ABNORMAL LOW (ref 8.9–10.3)
Calcium: 7.8 mg/dL — ABNORMAL LOW (ref 8.9–10.3)
Chloride: 99 mmol/L — ABNORMAL LOW (ref 101–111)
Chloride: 103 mmol/L (ref 101–111)
Chloride: 98 mmol/L — ABNORMAL LOW (ref 101–111)
Chloride: 99 mmol/L — ABNORMAL LOW (ref 101–111)
Chloride: 101 mmol/L (ref 101–111)
Creatinine, Ser: 3.17 mg/dL — ABNORMAL HIGH (ref 0.61–1.24)
Creatinine, Ser: 3 mg/dL — ABNORMAL HIGH (ref 0.61–1.24)
Creatinine, Ser: 2.64 mg/dL — ABNORMAL HIGH (ref 0.61–1.24)
Creatinine, Ser: 2.55 mg/dL — ABNORMAL HIGH (ref 0.61–1.24)
GFR calc Af Amer: 24 mL/min — ABNORMAL LOW (ref >60)
GFR calc Af Amer: 26 mL/min — ABNORMAL LOW (ref >60)
GFR calc Af Amer: 28 mL/min — ABNORMAL LOW (ref >60)
GFR calc Af Amer: 29 mL/min — ABNORMAL LOW (ref >60)
GFR calc non Af Amer: 20 mL/min — ABNORMAL LOW (ref >60)
GFR calc non Af Amer: 22 mL/min — ABNORMAL LOW (ref >60)
GFR calc non Af Amer: 24 mL/min — ABNORMAL LOW (ref >60)
GFR calc non Af Amer: 27 mL/min — ABNORMAL LOW (ref >60)
GFR, EST AFRICAN AMERICAN: 25 mL/min — AB (ref >60)
GFR, EST AFRICAN AMERICAN: 31 mL/min — AB (ref >60)
GFR, EST NON AFRICAN AMERICAN: 23 mL/min — AB (ref >60)
GFR, EST NON AFRICAN AMERICAN: 25 mL/min — AB (ref >60)
GLUCOSE: 249 mg/dL — AB (ref 65–99)
GLUCOSE: 248 mg/dL — AB (ref 65–99)
Glucose, Bld: 136 mg/dL — ABNORMAL HIGH (ref 65–99)
Glucose, Bld: 123 mg/dL — ABNORMAL HIGH (ref 65–99)
Glucose, Bld: 188 mg/dL — ABNORMAL HIGH (ref 65–99)
Glucose, Bld: 152 mg/dL — ABNORMAL HIGH (ref 65–99)
PHOSPHORUS: 2.5 mg/dL (ref 2.5–4.6)
PHOSPHORUS: 2.2 mg/dL — AB (ref 2.5–4.6)
POTASSIUM: 3.5 mmol/L (ref 3.5–5.1)
POTASSIUM: 3.5 mmol/L (ref 3.5–5.1)
POTASSIUM: 3.5 mmol/L (ref 3.5–5.1)
Phosphorus: 2.6 mg/dL (ref 2.5–4.6)
Phosphorus: 2 mg/dL — ABNORMAL LOW (ref 2.5–4.6)
Phosphorus: 1.8 mg/dL — ABNORMAL LOW (ref 2.5–4.6)
Phosphorus: 1.8 mg/dL — ABNORMAL LOW (ref 2.5–4.6)
Potassium: 3.4 mmol/L — ABNORMAL LOW (ref 3.5–5.1)
Potassium: 3.5 mmol/L (ref 3.5–5.1)
Potassium: 3.6 mmol/L (ref 3.5–5.1)
SODIUM: 134 mmol/L — AB (ref 135–145)
SODIUM: 130 mmol/L — AB (ref 135–145)
SODIUM: 132 mmol/L — AB (ref 135–145)
Sodium: 131 mmol/L — ABNORMAL LOW (ref 135–145)
Sodium: 132 mmol/L — ABNORMAL LOW (ref 135–145)
Sodium: 130 mmol/L — ABNORMAL LOW (ref 135–145)

## 2017-02-07 LAB — MAGNESIUM: MAGNESIUM: 1.7 mg/dL (ref 1.7–2.4)

## 2017-02-07 LAB — GLUCOSE, CAPILLARY
GLUCOSE-CAPILLARY: 134 mg/dL — AB (ref 65–99)
Glucose-Capillary: 103 mg/dL — ABNORMAL HIGH (ref 65–99)
Glucose-Capillary: 157 mg/dL — ABNORMAL HIGH (ref 65–99)
Glucose-Capillary: 154 mg/dL — ABNORMAL HIGH (ref 65–99)

## 2017-02-07 MED ORDER — ALBUMIN HUMAN 25 % IV SOLN
12.5000 g | Freq: Three times a day (TID) | INTRAVENOUS | Status: DC
  Administered 2017-02-07 – 2017-02-08 (×5): 12.5 g via INTRAVENOUS
  Filled 2017-02-07 (×8): qty 50

## 2017-02-07 MED ORDER — ALBUMIN HUMAN 5 % IV SOLN
12.5000 g | Freq: Three times a day (TID) | INTRAVENOUS | Status: DC
  Filled 2017-02-07: qty 250

## 2017-02-07 MED ORDER — MAGNESIUM SULFATE 2 GM/50ML IV SOLN
2.0000 g | INTRAVENOUS | Status: AC
  Administered 2017-02-07: 2 g via INTRAVENOUS
  Filled 2017-02-07: qty 50

## 2017-02-07 NOTE — Progress Notes (Signed)
[employer] - [place] at [facility]   PATIENT NAME: [name]    MR#:  [id number]  DATE OF BIRTH:  1961/08/21  SUBJECTIVE:   Weakness and good oral intake.  Still on levophed drip. oliguria.  REVIEW OF SYSTEMS:    Review of Systems  Constitutional: Positive for malaise/fatigue. Negative for chills and fever.  HENT: Negative.  Negative for ear discharge, ear pain, hearing loss, nosebleeds and sore throat.   Eyes: Negative.  Negative for blurred vision and pain.  Respiratory: Negative for cough, hemoptysis, shortness of breath and wheezing.   Cardiovascular: Positive for leg swelling. Negative for chest pain and palpitations.  Gastrointestinal: Negative.  Negative for abdominal pain, blood in stool, diarrhea, nausea and vomiting.  Genitourinary: Negative for dysuria.  Musculoskeletal: Negative.  Negative for back pain.  Skin: Negative.   Neurological: Positive for weakness. Negative for dizziness, tremors, speech change, focal weakness, seizures and headaches.  Endo/Heme/Allergies: Negative.  Does not bruise/bleed easily.  Psychiatric/Behavioral: Negative.  Negative for depression, hallucinations and suicidal ideas.    Tolerating Diet: yes   DRUG ALLERGIES:   Allergies  Allergen Reactions  . Clindamycin/Lincomycin Diarrhea    VITALS:  Blood pressure (!) 71/43, pulse 97, temperature 98 F (36.7 C), temperature source Oral, resp. rate (!) 24, height 6\' 4"  (1.93 m), weight (!) 306 lb 10.6 oz (139.1 kg), SpO2 97 %.  PHYSICAL EXAMINATION:   Physical Exam  Constitutional: He is oriented to person, place, and time and well-developed, well-nourished, and in no distress. No distress.  Anasarca, morbid obese.  HENT:  Head: Normocephalic.  Right IJ and PICC  Eyes: No scleral icterus.  Neck: Normal range of motion. Neck supple. No JVD present. No tracheal deviation present.  Cardiovascular: Normal rate, regular rhythm and normal heart sounds.  Exam reveals  no gallop and no friction rub.   No murmur heard. Pulmonary/Chest: Effort normal and breath sounds normal. No respiratory distress. He has no wheezes. He has no rales. He exhibits no tenderness.  Abdominal: Soft. Bowel sounds are normal. He exhibits distension. He exhibits no mass. There is no tenderness. There is no rebound and no guarding.  Musculoskeletal: Normal range of motion. He exhibits edema.  Anasarca.  Neurological: He is alert and oriented to person, place, and time.  Bilateral edema with chronic skin changes.  Skin: Skin is warm. No rash noted. No erythema.  Psychiatric: Affect and judgment normal.   LABORATORY PANEL:   CBC  Recent Labs Lab 02/06/17 0616  WBC 10.4  HGB 8.8*  HCT 25.8*  PLT 66*   ------------------------------------------------------------------------------------------------------------------  Chemistries   Recent Labs Lab 02/07/17 0629  NA 134*  K 3.5  CL 103  CO2 28  GLUCOSE 123*  BUN 24*  CREATININE 3.00*  CALCIUM 8.0*   ------------------------------------------------------------------------------------------------------------------  Cardiac Enzymes No results for input(s): TROPONINI in the last 168 hours. ------------------------------------------------------------------------------------------------------------------  RADIOLOGY:  No results found.   ASSESSMENT AND PLAN:    56 year old male with EtOH related liver cirrhosis and portal gastropathy who was recently discharged for sacral decubitus presents with septic shock due to infected sacral decubitus and acute kidney failure due to sepsis.   1. Acute hypoxic respiratory failure in the setting of pulmonary vascular congestion due to renal failure Resolved but hypoxia again, try to wean off O2 [name].  2. Acute kidney failure with anuria received CRT from February 9 - 15th. Renal failure unfortunately persists. If his acute renal failure is prolonged he will likely need  treatment  and outpatient dialysis center. Per Dr. Wynelle LinkKolluru, continue HD today.  3. EtOH liver cirrhosis with anasarca and portal hypertensive gastropathy with slow GI bleed: Continue  PPI and Carafate. FOTB positive. Octreotide stopped.  hemoglobin is stable.  Ascites. Need paracentesis if hypotension is better.  4. Chronic thrombocytopenia due to problem #3, stable.  5. Septic shock from infected sacral decubitus (d/c from hospital on 2/9 with CTX and flagyl): Continue ceftriaxone and Flagyl as per ID recommendations   Hypotension. Still on levophed drip.  Continue midodrine.    LTAC disposition is declined by insurance per SW and CM. Management plans discussed with the patient and he is in agreement.  CODE STATUS: LIMITED  TOTAL CRITICAL TIME TAKING CARE OF THIS PATIENT: 37 minutes.   Shaune Pollackhen, Harmonie Verrastro M.D on 02/07/2017 at 11:54 AM  Between 7am to 6pm - Pager - [phone] After 6pm go to [email or web address] - password [facility] [facility]  [facility] [place] [facility]  Office  [phone]  CC: Primary care physician; Corky DownsMASOUD,[name], MD  Note: This dictation was prepared with Dragon dictation along with smaller phrase technology. Any transcriptional errors that result from this process are unintentional.

## 2017-02-07 NOTE — Progress Notes (Signed)
MEDICATION RELATED CONSULT NOTE - INITIAL   Pharmacy Consult for daily review of medications for adjustment for CRRT Indication: CVVHD  Allergies  Allergen Reactions  . Clindamycin/Lincomycin Diarrhea    Patient Measurements: Height: 6\' 4"  (193 cm) Weight: (!) 306 lb 10.6 oz (139.1 kg) IBW/kg (Calculated) : 86.8  Vital Signs: Temp: 98.6 F (37 C) (02/26 1200) Temp Source: Oral (02/26 1200) BP: 99/67 (02/26 1400) Pulse Rate: 105 (02/26 1400) Intake/Output from previous day: 02/25 0701 - 02/26 0700 In: 1602.7 [P.O.:540; I.V.:1012.7; IV Piggyback:50] Out: 1428 [Urine:10] Intake/Output from this shift: Total I/O In: 600 [P.O.:480; I.V.:20; IV Piggyback:100] Out: 477 [Other:477]  Labs:  Recent Labs  02/04/17 1630 02/06/17 0616  02/07/17 0629 02/07/17 1046 02/07/17 1341 02/07/17 1344  WBC 11.0* 10.4  --   --   --   --   --   HGB 8.6* 8.8*  --   --   --   --   --   HCT 25.9* 25.8*  --   --   --   --   --   PLT 78* 66*  --   --   --   --   --   CREATININE  --   --   < > 3.00* 2.91*  --  2.76*  MG  --   --   --   --   --  1.7  --   PHOS 3.1  --   < > 2.5 2.2*  --  2.0*  ALBUMIN  --   --   < > 1.9* 2.1*  --  2.1*  < > = values in this interval not displayed. Estimated Creatinine Clearance: 45.5 mL/min (by C-G formula based on SCr of 2.76 mg/dL (H)).  Medications:  Scheduled:  . albumin human  12.5 g Intravenous Q8H  . cefTRIAXone (ROCEPHIN) IVPB 2 gram/50 mL D5W (Pyxis)  2 g Intravenous Q24H  . feeding supplement (ENSURE ENLIVE)  237 mL Oral TID BM  . feeding supplement (PRO-STAT SUGAR FREE 64)  30 mL Oral BID  . folic acid  1 mg Oral Daily  . hydrocerin   Topical BID  . insulin aspart  0-9 Units Subcutaneous TID WC  . mouth rinse  15 mL Mouth Rinse BID  . metroNIDAZOLE  500 mg Oral Q8H  . midodrine  10 mg Oral TID WC  . pantoprazole  40 mg Oral BID AC  . sodium chloride flush  10-40 mL Intracatheter Q12H  . sucralfate  1 g Oral TID WC & HS   Infusions:  .  norepinephrine (LEVOPHED) Adult infusion 13 mcg/min (02/07/17 1256)  . pureflow 500 each (02/07/17 [id number])   PRN: sodium chloride, heparin, ipratropium-albuterol, loperamide, ondansetron (ZOFRAN) IV, sodium chloride flush, zolpidem  Assessment: Pharmacy consulted to monitor medications daily to assess for the need to renally adjust while on CRRT. Patient was receiving intermittent HD, but CVVHD will be started today.  Plan:  At this time, no medications require dose adjustment. Valentina Guhristy, Eulis Salazar D, PharmD Clinical Pharmacist 02/07/2017,3:31 PM

## 2017-02-07 NOTE — Progress Notes (Signed)
[facility]  ROUNDING NOTE   Subjective:   On CRRT.  UF 1343 Na 134 Creatinine 3 (3.17)  Midodrine Ceftriaxone  2 L [place]  Objective:  Vital signs in last 24 hours:  Temp:  [97.6 F (36.4 C)-98 F (36.7 C)] 98 F (36.7 C) (02/26 0800) Pulse Rate:  [63-101] 97 (02/26 0900) Resp:  [9-24] 24 (02/26 0900) BP: (71-107)/(43-89) 71/43 (02/26 0900) SpO2:  [92 %-100 %] 97 % (02/26 0900) Weight:  [139.1 kg (306 lb 10.6 oz)] 139.1 kg (306 lb 10.6 oz) (02/26 0419)  Weight change: -1.1 kg (-2 lb 6.8 oz) Filed Weights   02/05/17 0500 02/06/17 0418 02/07/17 0419  Weight: (!) 146.6 kg (323 lb 3.1 oz) (!) 140.2 kg (309 lb 1.4 oz) (!) 139.1 kg (306 lb 10.6 oz)    Intake/Output: I/O last 3 completed shifts: In: 1602.7 [P.O.:540; I.V.:1012.7; IV Piggyback:50] Out: 1428 [Urine:10; Other:1418]   Intake/Output this shift:  Total I/O In: 240 [P.O.:240] Out: 143 [Other:143]  Physical Exam: General: Critically ill  Head: Normocephalic, atraumatic. Moist oral mucosal membranes  Eyes: Anicteric  Neck: Supple  Lungs:  Clear to auscultation, normal effort  Heart: Regular rate and rhythm  Abdomen:  Soft, nontender, obese, distended   Extremities: 3+ dependent peripheral edema. Anasarca  Neurologic: Alert and oriented, following commands  Skin: Scattered bruising  Access: Right IJ temp HD catheter 2/9    Basic Metabolic Panel:  Recent Labs Lab 02/06/17 1608 02/06/17 1754 02/06/17 2151 02/07/17 0309 02/07/17 0629  NA 132* 133* 132* 131* 134*  K 3.5 3.5 3.3* 3.4* 3.5  CL 99* 100* 100* 99* 103  CO2 27 27 26 26 28   GLUCOSE 175* 161* 190* 136* 123*  BUN 31* 30* 27* 25* 24*  CREATININE 3.61* 3.62* 3.32* 3.17* 3.00*  CALCIUM 8.0* 8.3* 8.0* 8.0* 8.0*  PHOS 3.1 2.8 2.6 2.6 2.5    Liver Function Tests:  Recent Labs Lab 02/06/17 1608 02/06/17 1754 02/06/17 2151 02/07/17 0309 02/07/17 0629  ALBUMIN 2.1* 2.2* 2.1* 2.0* 1.9*   No results for input(s): LIPASE,  AMYLASE in the last 168 hours. No results for input(s): AMMONIA in the last 168 hours.  CBC:  Recent Labs Lab 02/01/17 0311 02/03/17 0330 02/04/17 0500 02/04/17 1630 02/06/17 0616  WBC 9.6 10.1 10.8* 11.0* 10.4  HGB 9.2* 9.2* 8.1* 8.6* 8.8*  HCT 28.0* 27.2* 24.2* 25.9* 25.8*  MCV 107.1* 105.6* 106.8* 106.6* 105.1*  PLT 63* 65* 68* 78* 66*    Cardiac Enzymes: No results for input(s): CKTOTAL, CKMB, CKMBINDEX, TROPONINI in the last 168 hours.  BNP: Invalid input(s): POCBNP  CBG:  Recent Labs Lab 02/06/17 0759 02/06/17 1201 02/06/17 1653 02/06/17 2201 02/07/17 0734  GLUCAP 122* 117* 146* 178* 103*    Microbiology: Results for orders placed or performed during the hospital encounter of 02/01/2017  Blood Culture (routine x 2)     Status: None   Collection Time: 02/05/2017 10:27 AM  Result Value Ref Range Status   Specimen Description BLOOD LEFT ARM  Final   Special Requests BOTTLES DRAWN AEROBIC AND ANAEROBIC ANA2ML AER3ML  Final   Culture NO GROWTH 5 DAYS  Final   Report Status 01/26/2017 FINAL  Final  C difficile quick scan w PCR reflex     Status: None   Collection Time: 01/19/2017 11:28 AM  Result Value Ref Range Status   C Diff antigen NEGATIVE NEGATIVE Final   C Diff toxin NEGATIVE NEGATIVE Final   C Diff interpretation No C.  difficile detected.  Final  Gastrointestinal Panel by PCR , Stool     Status: None   Collection Time: 2017/01/22 11:28 AM  Result Value Ref Range Status   Campylobacter species NOT DETECTED NOT DETECTED Final   Plesimonas shigelloides NOT DETECTED NOT DETECTED Final   Salmonella species NOT DETECTED NOT DETECTED Final   Yersinia enterocolitica NOT DETECTED NOT DETECTED Final   Vibrio species NOT DETECTED NOT DETECTED Final   Vibrio cholerae NOT DETECTED NOT DETECTED Final   Enteroaggregative E coli (EAEC) NOT DETECTED NOT DETECTED Final   Enteropathogenic E coli (EPEC) NOT DETECTED NOT DETECTED Final   Enterotoxigenic E coli (ETEC) NOT  DETECTED NOT DETECTED Final   Shiga like toxin producing E coli (STEC) NOT DETECTED NOT DETECTED Final   Shigella/Enteroinvasive E coli (EIEC) NOT DETECTED NOT DETECTED Final   Cryptosporidium NOT DETECTED NOT DETECTED Final   Cyclospora cayetanensis NOT DETECTED NOT DETECTED Final   Entamoeba histolytica NOT DETECTED NOT DETECTED Final   Giardia lamblia NOT DETECTED NOT DETECTED Final   Adenovirus F40/41 NOT DETECTED NOT DETECTED Final   Astrovirus NOT DETECTED NOT DETECTED Final   Norovirus GI/GII NOT DETECTED NOT DETECTED Final   Rotavirus A NOT DETECTED NOT DETECTED Final   Sapovirus (I, II, IV, and V) NOT DETECTED NOT DETECTED Final  Blood Culture (routine x 2)     Status: None   Collection Time: 01/22/17 11:28 AM  Result Value Ref Range Status   Specimen Description BLOOD RIGHT PICC  Final   Special Requests BOTTLES DRAWN AEROBIC AND ANAEROBIC ANA7ML AER9ML  Final   Culture NO GROWTH 5 DAYS  Final   Report Status 01/26/2017 FINAL  Final  Urine culture     Status: None   Collection Time: 22-Jan-2017 12:10 PM  Result Value Ref Range Status   Specimen Description URINE, RANDOM  Final   Special Requests NONE  Final   Culture   Final    NO GROWTH Performed at [facility], [address], [place], [place] [address]    Report Status 01/22/2017 FINAL  Final  MRSA PCR Screening     Status: None   Collection Time: 01-22-2017  6:07 PM  Result Value Ref Range Status   MRSA by PCR NEGATIVE NEGATIVE Final    Comment:        The GeneXpert MRSA Assay (FDA approved for NASAL specimens only), is one component of a comprehensive MRSA colonization surveillance program. It is not intended to diagnose MRSA infection nor to guide or monitor treatment for MRSA infections.   C difficile quick scan w PCR reflex     Status: None   Collection Time: 02/03/17  4:26 PM  Result Value Ref Range Status   C Diff antigen NEGATIVE NEGATIVE Final   C Diff toxin NEGATIVE NEGATIVE Final   C Diff  interpretation No C. difficile detected.  Final    Coagulation Studies: No results for input(s): LABPROT, INR in the last 72 hours.  Urinalysis: No results for input(s): COLORURINE, LABSPEC, PHURINE, GLUCOSEU, HGBUR, BILIRUBINUR, KETONESUR, PROTEINUR, UROBILINOGEN, NITRITE, LEUKOCYTESUR in the last 72 hours.  Invalid input(s): APPERANCEUR    Imaging: No results found.   Medications:   . norepinephrine (LEVOPHED) Adult infusion 6 mcg/min (02/07/17 0927)  . pureflow 500 each (02/07/17 6045)   . cefTRIAXone (ROCEPHIN) IVPB 2 gram/50 mL D5W (Pyxis)  2 g Intravenous Q24H  . feeding supplement (ENSURE ENLIVE)  237 mL Oral TID BM  . feeding supplement (PRO-STAT  SUGAR FREE 64)  30 mL Oral BID  . folic acid  1 mg Oral Daily  . hydrocerin   Topical BID  . insulin aspart  0-9 Units Subcutaneous TID WC  . mouth rinse  15 mL Mouth Rinse BID  . metroNIDAZOLE  500 mg Oral Q8H  . midodrine  10 mg Oral TID WC  . pantoprazole  40 mg Oral BID AC  . sodium chloride flush  10-40 mL Intracatheter Q12H  . sucralfate  1 g Oral TID WC & HS   sodium chloride, heparin, ipratropium-albuterol, loperamide, ondansetron (ZOFRAN) IV, sodium chloride flush, zolpidem  Assessment/ Plan:  Mr. [name] is a 56 y.o. white male with decubitus ulcer, hypertension, congestive heart failure, atrial fibrillation, Alcoholic cirrhosis, depression who was admitted to [facility] from 1/14 to 2/2 for sepsis. He was discharged with metronidazole and ceftriaxone for a total of six weeks. [facility] with sepsis.   1. Acute renal failure:  Baseline creatinine of 0.86 on 01/11/17 CRRT on 2/9-2/15, followed by IHD.  Back on CRRT 02/06/17.  - Increase ultrafiltration to 110400mL/hr - Continue CRRT for now. Monitor urine output, volume status and renal function.   2.  Alcoholic cirrhosis, Anasarca/Edema - 3rd spacing with hypoalbuminemia.  - IV albumin ordered  3. Hyponatremia: secondary to  volume, liver failure and renal failure. Not a candidate for tolvaptan.   4. Hypotension with sepsis - on vasopressors: norepinephrine and midodrine - cefriaxone.   5. Anemia with renal failure: macrocytic. With low platelets. Secondary to ongoing alcohol.    LOS: 17 [place] 2/26/201810:28 AM

## 2017-02-07 NOTE — Progress Notes (Signed)
Pt has remained alert and oriented with no c/o pain. NSR/ST on cardiac monitor. An event of torsade de pointe this afternoon. Mg+ was drawn- 1.7. Mg+ 2g ordered.   Lung sounds have remained diminished. 2LNC-SpO2 > 95%. CRRT has remained running-no issues to report.

## 2017-02-08 DIAGNOSIS — E43 Unspecified severe protein-calorie malnutrition: Secondary | ICD-10-CM

## 2017-02-08 LAB — RENAL FUNCTION PANEL
ALBUMIN: 2.5 g/dL — AB (ref 3.5–5.0)
ALBUMIN: 2.5 g/dL — AB (ref 3.5–5.0)
ALBUMIN: 2.6 g/dL — AB (ref 3.5–5.0)
ANION GAP: 6 (ref 5–15)
Albumin: 2.4 g/dL — ABNORMAL LOW (ref 3.5–5.0)
Albumin: 2.1 g/dL — ABNORMAL LOW (ref 3.5–5.0)
Anion gap: 5 (ref 5–15)
Anion gap: 4 — ABNORMAL LOW (ref 5–15)
Anion gap: 5 (ref 5–15)
Anion gap: 8 (ref 5–15)
BUN: 21 mg/dL — AB (ref 6–20)
BUN: 20 mg/dL (ref 6–20)
BUN: 20 mg/dL (ref 6–20)
BUN: 20 mg/dL (ref 6–20)
BUN: 20 mg/dL (ref 6–20)
CALCIUM: 8.2 mg/dL — AB (ref 8.9–10.3)
CALCIUM: 7.9 mg/dL — AB (ref 8.9–10.3)
CHLORIDE: 101 mmol/L (ref 101–111)
CHLORIDE: 102 mmol/L (ref 101–111)
CO2: 26 mmol/L (ref 22–32)
CO2: 28 mmol/L (ref 22–32)
CO2: 26 mmol/L (ref 22–32)
CO2: 24 mmol/L (ref 22–32)
CO2: 23 mmol/L (ref 22–32)
CREATININE: 2.43 mg/dL — AB (ref 0.61–1.24)
CREATININE: 2.38 mg/dL — AB (ref 0.61–1.24)
CREATININE: 2.33 mg/dL — AB (ref 0.61–1.24)
Calcium: 8.1 mg/dL — ABNORMAL LOW (ref 8.9–10.3)
Calcium: 8.4 mg/dL — ABNORMAL LOW (ref 8.9–10.3)
Calcium: 8.1 mg/dL — ABNORMAL LOW (ref 8.9–10.3)
Chloride: 102 mmol/L (ref 101–111)
Chloride: 101 mmol/L (ref 101–111)
Chloride: 103 mmol/L (ref 101–111)
Creatinine, Ser: 2.34 mg/dL — ABNORMAL HIGH (ref 0.61–1.24)
Creatinine, Ser: 2.26 mg/dL — ABNORMAL HIGH (ref 0.61–1.24)
GFR calc Af Amer: 33 mL/min — ABNORMAL LOW (ref >60)
GFR calc Af Amer: 36 mL/min — ABNORMAL LOW (ref >60)
GFR calc non Af Amer: 29 mL/min — ABNORMAL LOW (ref >60)
GFR calc non Af Amer: 31 mL/min — ABNORMAL LOW (ref >60)
GFR, EST AFRICAN AMERICAN: 33 mL/min — AB (ref >60)
GFR, EST AFRICAN AMERICAN: 34 mL/min — AB (ref >60)
GFR, EST AFRICAN AMERICAN: 34 mL/min — AB (ref >60)
GFR, EST NON AFRICAN AMERICAN: 28 mL/min — AB (ref >60)
GFR, EST NON AFRICAN AMERICAN: 29 mL/min — AB (ref >60)
GFR, EST NON AFRICAN AMERICAN: 30 mL/min — AB (ref >60)
GLUCOSE: 176 mg/dL — AB (ref 65–99)
GLUCOSE: 162 mg/dL — AB (ref 65–99)
Glucose, Bld: 142 mg/dL — ABNORMAL HIGH (ref 65–99)
Glucose, Bld: 148 mg/dL — ABNORMAL HIGH (ref 65–99)
Glucose, Bld: 258 mg/dL — ABNORMAL HIGH (ref 65–99)
PHOSPHORUS: 1.8 mg/dL — AB (ref 2.5–4.6)
PHOSPHORUS: 1.6 mg/dL — AB (ref 2.5–4.6)
PHOSPHORUS: 2.1 mg/dL — AB (ref 2.5–4.6)
POTASSIUM: 4.1 mmol/L (ref 3.5–5.1)
Phosphorus: 1.7 mg/dL — ABNORMAL LOW (ref 2.5–4.6)
Phosphorus: 1.6 mg/dL — ABNORMAL LOW (ref 2.5–4.6)
Potassium: 3.5 mmol/L (ref 3.5–5.1)
Potassium: 3.7 mmol/L (ref 3.5–5.1)
Potassium: 3.7 mmol/L (ref 3.5–5.1)
Potassium: 3.7 mmol/L (ref 3.5–5.1)
SODIUM: 132 mmol/L — AB (ref 135–145)
SODIUM: 134 mmol/L — AB (ref 135–145)
SODIUM: 134 mmol/L — AB (ref 135–145)
SODIUM: 130 mmol/L — AB (ref 135–145)
Sodium: 134 mmol/L — ABNORMAL LOW (ref 135–145)

## 2017-02-08 LAB — URINALYSIS, COMPLETE (UACMP) WITH MICROSCOPIC
BACTERIA UA: NONE SEEN
Specific Gravity, Urine: 1.028 (ref 1.005–1.030)
Squamous Epithelial / LPF: NONE SEEN
WBC UA: NONE SEEN WBC/hpf (ref 0–5)

## 2017-02-08 LAB — HEMOGLOBIN AND HEMATOCRIT, BLOOD
HEMATOCRIT: 19.9 % — AB (ref 40.0–52.0)
HEMOGLOBIN: 6.6 g/dL — AB (ref 13.0–18.0)

## 2017-02-08 LAB — GLUCOSE, CAPILLARY
GLUCOSE-CAPILLARY: 130 mg/dL — AB (ref 65–99)
GLUCOSE-CAPILLARY: 131 mg/dL — AB (ref 65–99)
GLUCOSE-CAPILLARY: 129 mg/dL — AB (ref 65–99)
Glucose-Capillary: 153 mg/dL — ABNORMAL HIGH (ref 65–99)

## 2017-02-08 LAB — CBC
HCT: 19.8 % — ABNORMAL LOW (ref 40.0–52.0)
HEMOGLOBIN: 6.4 g/dL — AB (ref 13.0–18.0)
MCH: 35.5 pg — ABNORMAL HIGH (ref 26.0–34.0)
MCHC: 32.4 g/dL (ref 32.0–36.0)
MCV: 109.6 fL — ABNORMAL HIGH (ref 80.0–100.0)
Platelets: 37 10*3/uL — ABNORMAL LOW (ref 150–440)
RBC: 1.8 MIL/uL — AB (ref 4.40–5.90)
RDW: 21 % — ABNORMAL HIGH (ref 11.5–14.5)
WBC: 7.9 10*3/uL (ref 3.8–10.6)

## 2017-02-08 LAB — PROTIME-INR
INR: 2.2
Prothrombin Time: 24.8 seconds — ABNORMAL HIGH (ref 11.4–15.2)

## 2017-02-08 LAB — PREPARE RBC (CROSSMATCH)

## 2017-02-08 LAB — APTT: aPTT: 54 seconds — ABNORMAL HIGH (ref 24–36)

## 2017-02-08 MED ORDER — SODIUM PHOSPHATES 45 MMOLE/15ML IV SOLN
30.0000 mmol | Freq: Once | INTRAVENOUS | Status: DC
  Filled 2017-02-08: qty 10

## 2017-02-08 MED ORDER — SODIUM CHLORIDE 0.9 % IV BOLUS (SEPSIS): 500.0000 mL | Freq: Once | INTRAVENOUS | Status: DC

## 2017-02-08 MED ORDER — K PHOS MONO-SOD PHOS DI & MONO 155-852-130 MG PO TABS
500.0000 mg | ORAL_TABLET | ORAL | Status: AC
  Administered 2017-02-08 (×2): 500 mg via ORAL
  Filled 2017-02-08 (×2): qty 2

## 2017-02-08 MED ORDER — SODIUM CHLORIDE 0.9 % IV SOLN
Freq: Once | INTRAVENOUS | Status: AC
  Administered 2017-02-08: 23:00:00 via INTRAVENOUS

## 2017-02-08 MED ORDER — SODIUM PHOSPHATES 45 MMOLE/15ML IV SOLN
30.0000 mmol | Freq: Once | INTRAVENOUS | Status: AC
  Administered 2017-02-09: 30 mmol via INTRAVENOUS
  Filled 2017-02-08: qty 10

## 2017-02-08 MED ORDER — ALBUMIN HUMAN 25 % IV SOLN
25.0000 g | Freq: Once | INTRAVENOUS | Status: AC
  Administered 2017-02-08: 25 g via INTRAVENOUS
  Filled 2017-02-08: qty 100

## 2017-02-08 MED ORDER — SODIUM CHLORIDE 0.9 % IV SOLN
Freq: Once | INTRAVENOUS | Status: AC
  Administered 2017-02-08: 17:00:00 via INTRAVENOUS

## 2017-02-08 NOTE — Progress Notes (Signed)
Spoke with Dr Wynelle LinkKolluru regarding patient maroon/dark brown urine. At this point no additional orders. Phos 1.7

## 2017-02-08 NOTE — Progress Notes (Signed)
[employer] [facility] [place] at [facility]   PATIENT NAME: [name]    MR#:  [id number]  DATE OF BIRTH:  10-08-1961  SUBJECTIVE:   Weakness and good oral intake.  Still on levophed drip. Oliguria. On O2 [place] 2L.  REVIEW OF SYSTEMS:    Review of Systems  Constitutional: Positive for malaise/fatigue. Negative for chills and fever.  HENT: Negative.  Negative for ear discharge, ear pain, hearing loss, nosebleeds and sore throat.   Eyes: Negative.  Negative for blurred vision and pain.  Respiratory: Negative for cough, hemoptysis, shortness of breath and wheezing.   Cardiovascular: Positive for leg swelling. Negative for chest pain and palpitations.  Gastrointestinal: Negative.  Negative for abdominal pain, blood in stool, diarrhea, nausea and vomiting.  Genitourinary: Negative for dysuria.  Musculoskeletal: Negative.  Negative for back pain.  Skin: Negative.   Neurological: Positive for weakness. Negative for dizziness, tremors, speech change, focal weakness, seizures and headaches.  Endo/Heme/Allergies: Negative.  Does not bruise/bleed easily.  Psychiatric/Behavioral: Negative.  Negative for depression, hallucinations and suicidal ideas.    Tolerating Diet: yes   DRUG ALLERGIES:   Allergies  Allergen Reactions  . Clindamycin/Lincomycin Diarrhea    VITALS:  Blood pressure (!) 77/59, pulse (!) 112, temperature 97.3 F (36.3 C), resp. rate 19, height 6\' 4"  (1.93 m), weight (!) 309 lb 11.9 oz (140.5 kg), SpO2 97 %.  PHYSICAL EXAMINATION:   Physical Exam  Constitutional: He is oriented to person, place, and time and well-developed, well-nourished, and in no distress. No distress.  Anasarca, morbid obese.  HENT:  Head: Normocephalic.  Right IJ and PICC  Eyes: No scleral icterus.  Neck: Normal range of motion. Neck supple. No JVD present. No tracheal deviation present.  Cardiovascular: Normal rate, regular rhythm and normal heart sounds.  Exam reveals no gallop  and no friction rub.   No murmur heard. Pulmonary/Chest: Effort normal and breath sounds normal. No respiratory distress. He has no wheezes. He has no rales. He exhibits no tenderness.  Abdominal: Soft. Bowel sounds are normal. He exhibits distension. He exhibits no mass. There is no tenderness. There is no rebound and no guarding.  Positive for ascites sign.  Musculoskeletal: Normal range of motion. He exhibits edema.  Anasarca.  Neurological: He is alert and oriented to person, place, and time.  Bilateral edema with chronic skin changes.  Skin: Skin is warm. No rash noted. No erythema.  Psychiatric: Affect and judgment normal.   LABORATORY PANEL:   CBC  Recent Labs Lab 02/06/17 0616  WBC 10.4  HGB 8.8*  HCT 25.8*  PLT 66*   ------------------------------------------------------------------------------------------------------------------  Chemistries   Recent Labs Lab 02/07/17 1341  02/08/17 0901  NA  --   < > 134*  K  --   < > 3.7  CL  --   < > 102  CO2  --   < > 26  GLUCOSE  --   < > 176*  BUN  --   < > 20  CREATININE  --   < > 2.34*  CALCIUM  --   < > 8.2*  MG 1.7  --   --   < > = values in this interval not displayed. ------------------------------------------------------------------------------------------------------------------  Cardiac Enzymes No results for input(s): TROPONINI in the last 168 hours. ------------------------------------------------------------------------------------------------------------------  RADIOLOGY:  No results found.   ASSESSMENT AND PLAN:    56 year old male with EtOH related liver cirrhosis and portal gastropathy who was recently discharged for sacral decubitus presents  with septic shock due to infected sacral decubitus and acute kidney failure due to sepsis.   1. Acute hypoxic respiratory failure in the setting of pulmonary vascular congestion due to renal failure Resolved but hypoxia again, on O2 [place] 2L.  2. Acute  kidney failure with anuria received CRT from February 9 - 15th. Renal failure unfortunately persists. If his acute renal failure is prolonged he will likely need treatment and outpatient dialysis center. Per Dr. Wynelle Link, continue CRRT today.  3. EtOH liver cirrhosis with anasarca and portal hypertensive gastropathy with slow GI bleed: Continue  PPI and Carafate. FOTB positive. Octreotide stopped.  hemoglobin is stable.  Ascites. Need paracentesis if hypotension is better.  4. Chronic thrombocytopenia due to problem #3, stable.  5. Septic shock from infected sacral decubitus (d/c from hospital on 2/9 with CTX and flagyl): Continue ceftriaxone and Flagyl as per ID recommendations   Hypotension. Still on levophed drip.  Continue midodrine.    LTAC disposition is declined by insurance per SW and CM. Management plans discussed with the patient and he is in agreement.  CODE STATUS: LIMITED  TOTAL CRITICAL TIME TAKING CARE OF THIS PATIENT: 37 minutes.   Shaune Pollack M.D on 02/08/2017 at 11:31 AM  Between 7am to 6pm - Pager - [phone] After 6pm go to [email or web address] - password [email or web address]  [facility]  Office  [phone]  CC: Primary care physician; Corky Downs, MD  Note: This dictation was prepared with Dragon dictation along with smaller phrase technology. Any transcriptional errors that result from this process are unintentional.

## 2017-02-08 NOTE — Progress Notes (Signed)
Spoke with Dr Wynelle LinkKolluru regarding patients cbc. He will place orders in system. Dr Belia HemanKasa will go over consent for blood transfusion. I paged Dr Imogene Burnhen to update him.

## 2017-02-08 NOTE — Progress Notes (Signed)
Pharmacy Note  Pharmacy Consult for daily review of medications for adjustment for CRRT/ Electrolyte Monitoring  Indication: CVVHD  Allergies  Allergen Reactions  . Clindamycin/Lincomycin Diarrhea    Patient Measurements: Height: 6\' 4"  (193 cm) Weight: (!) 309 lb 11.9 oz (140.5 kg) IBW/kg (Calculated) : 86.8  Vital Signs: Temp: 97.3 F (36.3 C) (02/27 0700) Temp Source: Oral (02/27 0500) BP: 74/55 (02/27 1330) Pulse Rate: 105 (02/27 1330) Intake/Output from previous day: 02/26 0701 - 02/27 0700 In: 1322.4 [P.O.:840; I.V.:382.4; IV Piggyback:100] Out: 2131 [Urine:12] Intake/Output from this shift: Total I/O In: 120 [P.O.:120] Out: 681 [Urine:15; Other:666]  Labs:  Recent Labs  02/06/17 0616  02/07/17 1341  02/08/17 0620 02/08/17 0901 02/08/17 1312 02/08/17 1416 02/08/17 1451  WBC 10.4  --   --   --   --   --  7.9  --   --   HGB 8.8*  --   --   --   --   --  6.4*  --   --   HCT 25.8*  --   --   --   --   --  19.8*  --   --   PLT 66*  --   --   --   --   --  37*  --   --   APTT  --   --   --   --   --   --   --  54*  --   CREATININE  --   < >  --   < > 2.38* 2.34*  --   --  2.33*  MG  --   --  1.7  --   --   --   --   --   --   PHOS  --   < >  --   < > 1.7* 1.6*  --   --  1.6*  ALBUMIN  --   < >  --   < > 2.5* 2.5*  --   --  2.1*  < > = values in this interval not displayed. Estimated Creatinine Clearance: 54.2 mL/min (by C-G formula based on SCr of 2.33 mg/dL (H)).   Potassium  Date Value Ref Range Status  02/08/2017 3.7 3.5 - 5.1 mmol/L Final  02/26/2013 4.4 3.5 - 5.1 mmol/L Final     Medications:  Scheduled:  . sodium chloride   Intravenous Once  . albumin human  12.5 g Intravenous Q8H  . cefTRIAXone (ROCEPHIN) IVPB 2 gram/50 mL D5W (Pyxis)  2 g Intravenous Q24H  . feeding supplement (ENSURE ENLIVE)  237 mL Oral TID BM  . feeding supplement (PRO-STAT SUGAR FREE 64)  30 mL Oral BID  . folic acid  1 mg Oral Daily  . hydrocerin   Topical BID  .  insulin aspart  0-9 Units Subcutaneous TID WC  . mouth rinse  15 mL Mouth Rinse BID  . metroNIDAZOLE  500 mg Oral Q8H  . pantoprazole  40 mg Oral BID AC  . phosphorus  500 mg Oral Q4H while awake  . sodium chloride flush  10-40 mL Intracatheter Q12H  . sucralfate  1 g Oral TID WC & HS   Infusions:  . norepinephrine (LEVOPHED) Adult infusion 14 mcg/min (02/08/17 1459)  . pureflow 2,500 mL/hr at 02/08/17 1130   PRN: sodium chloride, heparin, ipratropium-albuterol, loperamide, ondansetron (ZOFRAN) IV, sodium chloride flush, zolpidem  Assessment: Pharmacy consulted to monitor medications daily to assess for the need to renally  adjust while on CRRT. Patient was receiving intermittent HD, but CVVHD will be started today.  Plan:  1. At this time, no medications require dose adjustment.  2. Phos replaced orally and with IV sodium phosphate. Will f/u AM labs.   Valentina Gu, PharmD Clinical Pharmacist 02/08/2017,3:47 PM

## 2017-02-08 NOTE — Progress Notes (Signed)
Spoke with Dr Imogene Burnhen and obtained an order for EKG and cardiology consult. Heart rate has got up to 160's. Spoke with Dr Wynelle LinkKolluru and will not be able to transfuse phos until 20:00 due to patient getting blood and levophed. He only has a double lumen.

## 2017-02-08 NOTE — Progress Notes (Signed)
Patient ID: [name], male   DOB: 1961-11-01, 56 y.o.   MRN: [id number] Cardiology  I have been asked to review ECG. Full cardiology consult will follow tomorrow.  12 lead ECG shows sinus tachycardia with low voltage. He does not have evidence of an acute inferior MI.   Leonia ReevesGregg Taylor,M.D.

## 2017-02-08 NOTE — Progress Notes (Signed)
Spoke with Dr Isaiah SergeMannam regarding patient heart rate 130-140's. He will review chart and place orders.

## 2017-02-08 NOTE — Consult Note (Signed)
PULMONARY / CRITICAL CARE MEDICINE   Name: [name] MRN: [id number] DOB: 07-Feb-1961    ADMISSION DATE:  01/30/2017 CONSULTATION DATE:  02/08/17  REFERRING MD:  Dr. Allena KatzPatel  CHIEF COMPLAINT:  Hypotension and Low Hgb  HISTORY OF PRESENT ILLNESS:   [name] RouteMichael  J  Hypolite is a 56 yo male with PMH significant for atrial fibrillation, CHF, HTN , Alcoholic liver disease, severe PVD.  Patient has infected stage 4 sacral decubitus ulcer and Acute Renal Failure.  Patient was discharged to [facility] on 2/2 on ceftriaxone and flagyl . Patient returns to [facility] on 2/10  with severe sepsis, hypotension and oliguria. Patient was on the med surg floor medically managed by the hospitalist team. On 2/27  Patient was transferred back to the ICU due to severe septic shock requiring pressors and low Hgb requiring blood transfusion and due to Kidney injury.  PAST MEDICAL HISTORY :  He  has a past medical history of Anal fissure; Atrial fibrillation (HCC); CHF (congestive heart failure) (HCC); Heart murmur; Hypertension; Lymphadenopathy; Personal history of colonic polyps; and Pressure ulcer, sacrum.  PAST SURGICAL HISTORY: He  has a past surgical history that includes Hernia repair (2012); Colonoscopy (2012); larynx-amyloidosis-laser surgery  (2010); and Esophagogastroduodenoscopy (egd) with propofol (N/A, 12/18/2016).  Allergies  Allergen Reactions  . Clindamycin/Lincomycin Diarrhea    No current facility-administered medications on file prior to encounter.    Current Outpatient Prescriptions on File Prior to Encounter  Medication Sig  . benazepril (LOTENSIN) 40 MG tablet Take 1 tablet (40 mg total) by mouth daily.  . cefTRIAXone 2 g in dextrose 5 % 50 mL Inject 2 g into the vein daily.  . citalopram (CELEXA) 10 MG tablet Take 1 tablet (10 mg total) by mouth at bedtime.  . feeding supplement, ENSURE ENLIVE, (ENSURE ENLIVE) LIQD Take 237 mLs by mouth 2 (two) times daily with a meal.  . folic acid  (FOLVITE) 1 MG tablet Take 1 tablet (1 mg total) by mouth daily.  . furosemide (LASIX) 40 MG tablet Take 0.5 tablets (20 mg total) by mouth daily.  . metroNIDAZOLE (FLAGYL) 500 MG tablet Take 1 tablet (500 mg total) by mouth 3 (three) times daily. (Patient taking differently: Take 500 mg by mouth 3 (three) times daily. [id number],1400,2000)  . midodrine (PROAMATINE) 5 MG tablet Take 1 tablet (5 mg total) by mouth 3 (three) times daily with meals. (Patient taking differently: Take 5 mg by mouth 3 (three) times daily with meals. 9562,1308,[id number],1200,1700)  . naproxen sodium (ALEVE) 220 MG tablet Take 440 mg by mouth 2 (two) times daily with a meal.  . oxyCODONE (OXY IR/ROXICODONE) 5 MG immediate release tablet Take 1 tablet (5 mg total) by mouth every 6 (six) hours as needed for moderate pain or severe pain.  . potassium chloride (K-DUR,KLOR-CON) 10 MEQ tablet Take 10 mEq by mouth daily.   . sotalol (BETAPACE) 80 MG tablet Take 1 tablet (80 mg total) by mouth 2 (two) times daily. (Patient taking differently: Take 80 mg by mouth 2 (two) times daily. 0800,1600)  . spironolactone (ALDACTONE) 25 MG tablet Take 0.5 tablets (12.5 mg total) by mouth daily.    FAMILY HISTORY:  His indicated that the status of his brother is unknown. He indicated that the status of his other is unknown.    SOCIAL HISTORY: He  reports that he has been smoking Cigarettes.  He has a 20.00 pack-year smoking history. He has never used smokeless tobacco. He reports that he drinks alcohol.  He reports that he does not use drugs.  REVIEW OF SYSTEMS:   Review of Systems  Constitutional: Negative for diaphoresis, malaise/fatigue and weight loss.  HENT: Negative for congestion and nosebleeds.   Eyes: Negative for pain.  Respiratory: Negative for sputum production.   Cardiovascular: Negative for orthopnea and claudication.  Gastrointestinal: Negative for abdominal pain, blood in stool and constipation.  Neurological: Negative for sensory change,  speech change, focal weakness and weakness.  Psychiatric/Behavioral: Negative for hallucinations. The patient is not nervous/anxious.      SUBJECTIVE:  "Patient states that he feels sick"  VITAL SIGNS: BP (!) 81/61   Pulse (!) 114   Temp 97.7 F (36.5 C) (Axillary)   Resp 15   Ht 6\' 4"  (1.93 m)   Wt (!) 140.5 kg (309 lb 11.9 oz)   SpO2 100%   BMI 37.70 kg/m   HEMODYNAMICS:    VENTILATOR SETTINGS:    INTAKE / OUTPUT: I/O last 3 completed shifts: In: 1442.4 [P.O.:960; I.V.:382.4; IV Piggyback:100] Out: 3552 [Urine:42; Other:3510]  PHYSICAL EXAMINATION: General:  Acutely ill, Caucasian male in no acute distress, on Bipap Neuro:  Alert and oriented, follows command HEENT: AT,[place], no JVD Cardiovascular:  Irregular, no MRG noted Lungs:  Diminished bibasilar breath sounds, no crackles, wheezes,rhonchi noted  Abdomen:  Soft, non tender, distended Musculoskeletal:  Active ROM Skin: Sacral decub, no rashes, erythema noted  LABS:  BMET  Recent Labs Lab 02/08/17 0901 02/08/17 1451 02/08/17 2153  NA 134* 130* 134*  K 3.7 3.7 4.1  CL 102 101 103  CO2 26 24 23   BUN 20 20 20   CREATININE 2.34* 2.33* 2.26*  GLUCOSE 176* 258* 162*    Electrolytes  Recent Labs Lab 02/07/17 1341  02/08/17 0901 02/08/17 1451 02/08/17 2153  CALCIUM  --   < > 8.2* 7.9* 8.1*  MG 1.7  --   --   --   --   PHOS  --   < > 1.6* 1.6* 2.1*  < > = values in this interval not displayed.  CBC  Recent Labs Lab 02/04/17 1630 02/06/17 0616 02/08/17 1312 02/08/17 2153  WBC 11.0* 10.4 7.9  --   HGB 8.6* 8.8* 6.4* 6.6*  HCT 25.9* 25.8* 19.8* 19.9*  PLT 78* 66* 37*  --     Coag's  Recent Labs Lab 02/08/17 1416  APTT 54*  INR 2.20    Sepsis Markers No results for input(s): LATICACIDVEN, PROCALCITON, O2SATVEN in the last 168 hours.  ABG No results for input(s): PHART, PCO2ART, PO2ART in the last 168 hours.  Liver Enzymes  Recent Labs Lab 02/08/17 0901 02/08/17 1451  02/08/17 2153  ALBUMIN 2.5* 2.1* 2.6*    Cardiac Enzymes No results for input(s): TROPONINI, PROBNP in the last 168 hours.  Glucose  Recent Labs Lab 02/07/17 1624 02/07/17 2132 02/08/17 0710 02/08/17 1116 02/08/17 1744 02/08/17 2151  GLUCAP 157* 154* 153* 130* 131* 129*    Imaging No results found.   STUDIES:  12/18/16 ECHO>>Left ventricle: The cavity size was mildly dilated. Systolic  function was mildly reduced. The estimated ejection fraction was  45%  CULTURES: none  ANTIBIOTICS:  2/9eftriaxone>> 2/9 flagyl>> SIGNIFICANT EVENTS: 2/28 Patient was transferred to the ICU due to hypotension secondary to septic shock and low Hgb.  LINES/TUBES: 01/04/17 PICC>>   ASSESSMENT / PLAN:  PULMONARY A: OSA P:   CPAP at night Keep O2 SATS >94% Prn Bronchodilator  CARDIOVASCULAR A:  Septic shock secondary to infected sacral decub requiring pressors  Atrial Fibrillation Hx of HTN Chronic Diastolic CHF   P:  Continuous Telemetry Levo/vaso gtt Keep MAP goals>65 Continue Midodrine Received Albumin RENAL A:   Acute Renal Failure related to shock. Mild Hyponatremia secondary to volume overload 3rd spacing from low albumin Anasarca P:   Replace electrolytes per usual guidelines CRRT Nephrology following Avoid Nephrotoxic drugs Received Albumin GASTROINTESTINAL A:   Hx of Liver Cirrhosis Hx of Anal fissure GERD P:   Continue Protonix Regular diet with 1200 fluid restriction  HEMATOLOGIC A:   Anemia with renal failure: macrocytic. With low platelets. Secondary to ongoing alcohol Thrombocytopenia P:  Transfused 2 units of PRBC'S F/U on PT,PT,INR fibrinogen Follow H/H  INFECTIOUS A:   Septic shock secondary to infected sacral decubitus P:   Monitor fever curve Follow CBC Continue ceftriaxone /flagyl  ENDOCRINE A:   hyperglycemia P:   POCT with SSI coverage  NEUROLOGIC A:   No active issues P:   Provide supportive care   FAMILY   - Updates: No family present at the bedside.    Bincy Varughese,AG-ACNP Pulmonary and Critical Care Medicine [facility]   02/08/2017, 11:31 PM

## 2017-02-08 NOTE — Progress Notes (Signed)
[facility]  ROUNDING NOTE   Subjective:   On CRRT.  UF 2095 (1343) Net -711  Ceftriaxone, metronidazole   Objective:  Vital signs in last 24 hours:  Temp:  [97.3 F (36.3 C)-98.6 F (37 C)] 97.3 F (36.3 C) (02/27 0700) Pulse Rate:  [83-106] 105 (02/27 0700) Resp:  [9-27] 17 (02/27 0700) BP: (69-114)/(36-103) 96/50 (02/27 0700) SpO2:  [94 %-100 %] 99 % (02/27 0700) Weight:  [140.5 kg (309 lb 11.9 oz)] 140.5 kg (309 lb 11.9 oz) (02/27 0444)  Weight change: 1.4 kg (3 lb 1.4 oz) Filed Weights   02/06/17 0418 02/07/17 0419 02/08/17 0444  Weight: (!) 140.2 kg (309 lb 1.4 oz) (!) 139.1 kg (306 lb 10.6 oz) (!) 140.5 kg (309 lb 11.9 oz)    Intake/Output: I/O last 3 completed shifts: In: 1885 [P.O.:1140; I.V.:645; IV Piggyback:100] Out: 2911 [Urine:22; Other:2889]   Intake/Output this shift:  No intake/output data recorded.  Physical Exam: General: Critically ill  Head: Normocephalic, atraumatic. Moist oral mucosal membranes  Eyes: Anicteric  Neck: Supple  Lungs:  Clear to auscultation, normal effort  Heart: Regular rate and rhythm  Abdomen:  Soft, nontender, obese, distended   Extremities: 3+ dependent peripheral edema. Anasarca  Neurologic: Alert and oriented, following commands  Skin: Scattered bruising  Access: Right IJ temp HD catheter 2/9    Basic Metabolic Panel:  Recent Labs Lab 02/07/17 1341 02/07/17 1344 02/07/17 1818 02/07/17 2210 02/08/17 0203 02/08/17 0620  NA  --  130* 130* 132* 132* 134*  K  --  3.6 3.5 3.5 3.5 3.7  CL  --  99* 99* 101 101 102  CO2  --  26 26 26 26 28   GLUCOSE  --  249* 248* 152* 142* 148*  BUN  --  22* 22* 22* 21* 20  CREATININE  --  2.76* 2.64* 2.55* 2.43* 2.38*  CALCIUM  --  7.8* 7.8* 8.1* 8.1* 8.4*  MG 1.7  --   --   --   --   --   PHOS  --  2.0* 1.8* 1.8* 1.8* 1.7*    Liver Function Tests:  Recent Labs Lab 02/07/17 1344 02/07/17 1818 02/07/17 2210 02/08/17 0203 02/08/17 0620  ALBUMIN 2.1*  2.1* 2.4* 2.4* 2.5*   No results for input(s): LIPASE, AMYLASE in the last 168 hours. No results for input(s): AMMONIA in the last 168 hours.  CBC:  Recent Labs Lab 02/03/17 0330 02/04/17 0500 02/04/17 1630 02/06/17 0616  WBC 10.1 10.8* 11.0* 10.4  HGB 9.2* 8.1* 8.6* 8.8*  HCT 27.2* 24.2* 25.9* 25.8*  MCV 105.6* 106.8* 106.6* 105.1*  PLT 65* 68* 78* 66*    Cardiac Enzymes: No results for input(s): CKTOTAL, CKMB, CKMBINDEX, TROPONINI in the last 168 hours.  BNP: Invalid input(s): POCBNP  CBG:  Recent Labs Lab 02/07/17 0734 02/07/17 1156 02/07/17 1624 02/07/17 2132 02/08/17 0710  GLUCAP 103* 134* 157* 154* 153*    Microbiology: Results for orders placed or performed during the hospital encounter of 01/13/2017  Blood Culture (routine x 2)     Status: None   Collection Time: 02/04/2017 10:27 AM  Result Value Ref Range Status   Specimen Description BLOOD LEFT ARM  Final   Special Requests BOTTLES DRAWN AEROBIC AND ANAEROBIC ANA2ML AER3ML  Final   Culture NO GROWTH 5 DAYS  Final   Report Status 01/26/2017 FINAL  Final  C difficile quick scan w PCR reflex     Status: None   Collection Time: 02/08/2017  11:28 AM  Result Value Ref Range Status   C Diff antigen NEGATIVE NEGATIVE Final   C Diff toxin NEGATIVE NEGATIVE Final   C Diff interpretation No C. difficile detected.  Final  Gastrointestinal Panel by PCR , Stool     Status: None   Collection Time: 01/28/2017 11:28 AM  Result Value Ref Range Status   Campylobacter species NOT DETECTED NOT DETECTED Final   Plesimonas shigelloides NOT DETECTED NOT DETECTED Final   Salmonella species NOT DETECTED NOT DETECTED Final   Yersinia enterocolitica NOT DETECTED NOT DETECTED Final   Vibrio species NOT DETECTED NOT DETECTED Final   Vibrio cholerae NOT DETECTED NOT DETECTED Final   Enteroaggregative E coli (EAEC) NOT DETECTED NOT DETECTED Final   Enteropathogenic E coli (EPEC) NOT DETECTED NOT DETECTED Final   Enterotoxigenic E  coli (ETEC) NOT DETECTED NOT DETECTED Final   Shiga like toxin producing E coli (STEC) NOT DETECTED NOT DETECTED Final   Shigella/Enteroinvasive E coli (EIEC) NOT DETECTED NOT DETECTED Final   Cryptosporidium NOT DETECTED NOT DETECTED Final   Cyclospora cayetanensis NOT DETECTED NOT DETECTED Final   Entamoeba histolytica NOT DETECTED NOT DETECTED Final   Giardia lamblia NOT DETECTED NOT DETECTED Final   Adenovirus F40/41 NOT DETECTED NOT DETECTED Final   Astrovirus NOT DETECTED NOT DETECTED Final   Norovirus GI/GII NOT DETECTED NOT DETECTED Final   Rotavirus A NOT DETECTED NOT DETECTED Final   Sapovirus (I, II, IV, and V) NOT DETECTED NOT DETECTED Final  Blood Culture (routine x 2)     Status: None   Collection Time: 01/13/2017 11:28 AM  Result Value Ref Range Status   Specimen Description BLOOD RIGHT PICC  Final   Special Requests BOTTLES DRAWN AEROBIC AND ANAEROBIC ANA7ML AER9ML  Final   Culture NO GROWTH 5 DAYS  Final   Report Status 01/26/2017 FINAL  Final  Urine culture     Status: None   Collection Time: 01/24/2017 12:10 PM  Result Value Ref Range Status   Specimen Description URINE, RANDOM  Final   Special Requests NONE  Final   Culture   Final    NO GROWTH Performed at [facility], [address], [place], [place] [address]    Report Status 01/22/2017 FINAL  Final  MRSA PCR Screening     Status: None   Collection Time: 01/13/2017  6:07 PM  Result Value Ref Range Status   MRSA by PCR NEGATIVE NEGATIVE Final    Comment:        The GeneXpert MRSA Assay (FDA approved for NASAL specimens only), is one component of a comprehensive MRSA colonization surveillance program. It is not intended to diagnose MRSA infection nor to guide or monitor treatment for MRSA infections.   C difficile quick scan w PCR reflex     Status: None   Collection Time: 02/03/17  4:26 PM  Result Value Ref Range Status   C Diff antigen NEGATIVE NEGATIVE Final   C Diff toxin NEGATIVE NEGATIVE  Final   C Diff interpretation No C. difficile detected.  Final    Coagulation Studies: No results for input(s): LABPROT, INR in the last 72 hours.  Urinalysis: No results for input(s): COLORURINE, LABSPEC, PHURINE, GLUCOSEU, HGBUR, BILIRUBINUR, KETONESUR, PROTEINUR, UROBILINOGEN, NITRITE, LEUKOCYTESUR in the last 72 hours.  Invalid input(s): APPERANCEUR    Imaging: No results found.   Medications:   . norepinephrine (LEVOPHED) Adult infusion 13 mcg/min (02/07/17 1256)  . pureflow 2,500 mL/hr at 02/08/17 0603   .  albumin human  12.5 g Intravenous Q8H  . cefTRIAXone (ROCEPHIN) IVPB 2 gram/50 mL D5W (Pyxis)  2 g Intravenous Q24H  . feeding supplement (ENSURE ENLIVE)  237 mL Oral TID BM  . feeding supplement (PRO-STAT SUGAR FREE 64)  30 mL Oral BID  . folic acid  1 mg Oral Daily  . hydrocerin   Topical BID  . insulin aspart  0-9 Units Subcutaneous TID WC  . mouth rinse  15 mL Mouth Rinse BID  . metroNIDAZOLE  500 mg Oral Q8H  . midodrine  10 mg Oral TID WC  . pantoprazole  40 mg Oral BID AC  . sodium chloride flush  10-40 mL Intracatheter Q12H  . sucralfate  1 g Oral TID WC & HS   sodium chloride, heparin, ipratropium-albuterol, loperamide, ondansetron (ZOFRAN) IV, sodium chloride flush, zolpidem  Assessment/ Plan:  [name] is a 56 y.o. white male with decubitus ulcer, hypertension, congestive heart failure, atrial fibrillation, Alcoholic cirrhosis, depression who was admitted to [facility] from 1/14 to 2/2 for sepsis. He was discharged with metronidazole and ceftriaxone for a total of six weeks. Patient was admitted to [facility] on 2017-01-29 with sepsis.   1. Acute renal failure:  Baseline creatinine of 0.86 on 01/11/17 CRRT on 2/9-2/15, followed by IHD.  Back on CRRT 02/06/17.  - Increase ultrafiltration to 129mL/hr - Continue CRRT. Monitor urine output, volume status and renal function.   2.  Alcoholic cirrhosis, Anasarca/Edema - 3rd spacing with hypoalbuminemia.  - IV  albumin   3. Hyponatremia: Na 134. secondary to volume, liver failure and renal failure.   4. Hypotension with sepsis: secondary to decubitus ulcer - on vasopressors: norepinephrine - cefriaxone and metronidazole  5. Anemia with renal failure: macrocytic. With low platelets. Secondary to ongoing alcohol.  - Check CBC   LOS: 18 Carter Kaman 2/27/20189:30 AM

## 2017-02-08 NOTE — Care Management (Signed)
Met again with patient regarding LTAC choices. He states he received a letter from [facility] stating that "[facility] was out of network and would like to pursue in-network LTAC".  He agrees to check with [facility].  Nevin with [facility] has been notified and will check with patient's insurance regarding LTAC at her facility. Erika with [facility] updated of this change.

## 2017-02-08 NOTE — Progress Notes (Signed)
eLink Physician-Brief Progress Note Patient Name: [name] RouteMichael J [name] DOB: [birth year] MRN: [id number]   Date of Service  02/08/2017  HPI/Events of Note  Hypotension on levo Sinus tachycardia with no acute EKG changes Hb 6.4. No sign of active bleed H/O cirrhosis, anasarca.  eICU Interventions  Albumin 25 gm once Pt already getting 1 unit PRBC     Intervention Category Major Interventions: Hypotension - evaluation and management  Aerionna Moravek 02/08/2017, 6:35 PM

## 2017-02-09 DIAGNOSIS — R6521 Severe sepsis with septic shock: Secondary | ICD-10-CM

## 2017-02-09 DIAGNOSIS — N179 Acute kidney failure, unspecified: Secondary | ICD-10-CM

## 2017-02-09 DIAGNOSIS — K922 Gastrointestinal hemorrhage, unspecified: Secondary | ICD-10-CM

## 2017-02-09 DIAGNOSIS — R579 Shock, unspecified: Secondary | ICD-10-CM

## 2017-02-09 DIAGNOSIS — A419 Sepsis, unspecified organism: Secondary | ICD-10-CM

## 2017-02-09 LAB — CBC
HEMATOCRIT: 19.8 % — AB (ref 40.0–52.0)
HEMOGLOBIN: 6.5 g/dL — AB (ref 13.0–18.0)
MCH: 33.8 pg (ref 26.0–34.0)
MCHC: 32.7 g/dL (ref 32.0–36.0)
MCV: 103.4 fL — AB (ref 80.0–100.0)
Platelets: 39 10*3/uL — ABNORMAL LOW (ref 150–440)
RBC: 1.91 MIL/uL — ABNORMAL LOW (ref 4.40–5.90)
RDW: 22 % — AB (ref 11.5–14.5)
WBC: 11.3 10*3/uL — ABNORMAL HIGH (ref 3.8–10.6)

## 2017-02-09 LAB — PREPARE RBC (CROSSMATCH)

## 2017-02-09 LAB — RENAL FUNCTION PANEL
ANION GAP: 13 (ref 5–15)
Albumin: 2.5 g/dL — ABNORMAL LOW (ref 3.5–5.0)
BUN: 19 mg/dL (ref 6–20)
CHLORIDE: 103 mmol/L (ref 101–111)
CO2: 21 mmol/L — ABNORMAL LOW (ref 22–32)
Calcium: 8.2 mg/dL — ABNORMAL LOW (ref 8.9–10.3)
Creatinine, Ser: 2.37 mg/dL — ABNORMAL HIGH (ref 0.61–1.24)
GFR calc non Af Amer: 29 mL/min — ABNORMAL LOW (ref >60)
GFR, EST AFRICAN AMERICAN: 34 mL/min — AB (ref >60)
GLUCOSE: 142 mg/dL — AB (ref 65–99)
POTASSIUM: 4.4 mmol/L (ref 3.5–5.1)
Phosphorus: 2.8 mg/dL (ref 2.5–4.6)
Sodium: 137 mmol/L (ref 135–145)

## 2017-02-09 LAB — BASIC METABOLIC PANEL
Anion gap: 14 (ref 5–15)
BUN: 19 mg/dL (ref 6–20)
CALCIUM: 8.1 mg/dL — AB (ref 8.9–10.3)
CHLORIDE: 102 mmol/L (ref 101–111)
CO2: 20 mmol/L — AB (ref 22–32)
CREATININE: 2.21 mg/dL — AB (ref 0.61–1.24)
GFR calc non Af Amer: 32 mL/min — ABNORMAL LOW (ref >60)
GFR, EST AFRICAN AMERICAN: 37 mL/min — AB (ref >60)
GLUCOSE: 143 mg/dL — AB (ref 65–99)
Potassium: 4.3 mmol/L (ref 3.5–5.1)
Sodium: 136 mmol/L (ref 135–145)

## 2017-02-09 LAB — HEPARIN INDUCED PLATELET AB (HIT ANTIBODY): Heparin Induced Plt Ab: 0.682 OD — ABNORMAL HIGH (ref 0.000–0.400)

## 2017-02-09 LAB — FIBRINOGEN: FIBRINOGEN: 82 mg/dL — AB (ref 210–475)

## 2017-02-09 LAB — MAGNESIUM: Magnesium: 1.5 mg/dL — ABNORMAL LOW (ref 1.7–2.4)

## 2017-02-09 LAB — PROTIME-INR
INR: 2.91
Prothrombin Time: 31 seconds — ABNORMAL HIGH (ref 11.4–15.2)

## 2017-02-09 LAB — GLUCOSE, CAPILLARY: Glucose-Capillary: 84 mg/dL (ref 65–99)

## 2017-02-09 LAB — APTT: APTT: 60 s — AB (ref 24–36)

## 2017-02-09 LAB — PHOSPHORUS: Phosphorus: 2.7 mg/dL (ref 2.5–4.6)

## 2017-02-09 LAB — OCCULT BLOOD X 1 CARD TO LAB, STOOL: FECAL OCCULT BLD: POSITIVE — AB

## 2017-02-09 MED ORDER — ACETAMINOPHEN 650 MG RE SUPP: 650.0000 mg | Freq: Four times a day (QID) | RECTAL | Status: DC | PRN

## 2017-02-09 MED ORDER — ALBUTEROL SULFATE (2.5 MG/3ML) 0.083% IN NEBU: 2.5000 mg | INHALATION_SOLUTION | RESPIRATORY_TRACT | Status: DC | PRN

## 2017-02-09 MED ORDER — MAGNESIUM SULFATE 2 GM/50ML IV SOLN: 2.0000 g | Freq: Once | INTRAVENOUS | Status: DC

## 2017-02-09 MED ORDER — MIDODRINE HCL 5 MG PO TABS: 10.0000 mg | ORAL_TABLET | Freq: Three times a day (TID) | ORAL | Status: DC

## 2017-02-09 MED ORDER — GLYCOPYRROLATE 0.2 MG/ML IJ SOLN
0.2000 mg | INTRAMUSCULAR | Status: DC | PRN
  Administered 2017-02-09: 0.2 mg via INTRAVENOUS

## 2017-02-09 MED ORDER — SODIUM CHLORIDE 0.9 % IV SOLN
Freq: Once | INTRAVENOUS | Status: AC
  Administered 2017-02-09: 06:00:00 via INTRAVENOUS

## 2017-02-09 MED ORDER — VASOPRESSIN 20 UNIT/ML IV SOLN
0.0400 [IU]/min | INTRAVENOUS | Status: DC
  Administered 2017-02-09: 0.04 [IU]/min via INTRAVENOUS
  Filled 2017-02-09: qty 2

## 2017-02-09 MED ORDER — HALOPERIDOL 0.5 MG PO TABS
0.5000 mg | ORAL_TABLET | ORAL | Status: DC | PRN
  Filled 2017-02-09: qty 1

## 2017-02-09 MED ORDER — HALOPERIDOL LACTATE 2 MG/ML PO CONC
0.5000 mg | ORAL | Status: DC | PRN
  Filled 2017-02-09: qty 0.3

## 2017-02-09 MED ORDER — ATROPINE SULFATE 1 MG/10ML IJ SOSY
0.5000 mg | PREFILLED_SYRINGE | Freq: Once | INTRAMUSCULAR | Status: AC
  Administered 2017-02-09: 0.5 mg via INTRAVENOUS

## 2017-02-09 MED ORDER — ONDANSETRON 4 MG PO TBDP: 4.0000 mg | ORAL_TABLET | Freq: Four times a day (QID) | ORAL | Status: DC | PRN

## 2017-02-09 MED ORDER — HALOPERIDOL LACTATE 5 MG/ML IJ SOLN: 0.5000 mg | INTRAMUSCULAR | Status: DC | PRN

## 2017-02-09 MED ORDER — ACETAMINOPHEN 325 MG PO TABS: 650.0000 mg | ORAL_TABLET | Freq: Four times a day (QID) | ORAL | Status: DC | PRN

## 2017-02-09 MED ORDER — ONDANSETRON HCL 4 MG/2ML IJ SOLN: 4.0000 mg | Freq: Four times a day (QID) | INTRAMUSCULAR | Status: DC | PRN

## 2017-02-09 MED ORDER — POLYVINYL ALCOHOL 1.4 % OP SOLN
1.0000 [drp] | Freq: Four times a day (QID) | OPHTHALMIC | Status: DC | PRN
  Filled 2017-02-09: qty 15

## 2017-02-09 MED ORDER — SODIUM CHLORIDE 0.9 % IV SOLN
50.0000 ug/h | INTRAVENOUS | Status: DC
  Administered 2017-02-09: 50 ug/h via INTRAVENOUS
  Filled 2017-02-09 (×3): qty 1

## 2017-02-09 MED ORDER — GLYCOPYRROLATE 0.2 MG/ML IJ SOLN
0.2000 mg | INTRAMUSCULAR | Status: DC | PRN
  Filled 2017-02-09: qty 1

## 2017-02-09 MED ORDER — PHENYLEPHRINE HCL 10 MG/ML IJ SOLN
0.0000 ug/min | INTRAMUSCULAR | Status: DC
  Administered 2017-02-09: 10 ug/min via INTRAVENOUS
  Administered 2017-02-09: 400 ug/min via INTRAVENOUS
  Filled 2017-02-09 (×3): qty 4

## 2017-02-09 MED ORDER — LORAZEPAM 2 MG/ML IJ SOLN
2.0000 mg | INTRAMUSCULAR | Status: DC | PRN
  Administered 2017-02-09: 2 mg via INTRAVENOUS
  Filled 2017-02-09: qty 1

## 2017-02-09 MED ORDER — LORAZEPAM 2 MG/ML PO CONC: 1.0000 mg | ORAL | Status: DC | PRN

## 2017-02-09 MED ORDER — LORAZEPAM 0.5 MG PO TABS: 1.0000 mg | ORAL_TABLET | ORAL | Status: DC | PRN

## 2017-02-09 MED ORDER — MORPHINE SULFATE (PF) 4 MG/ML IV SOLN
2.0000 mg | INTRAVENOUS | Status: DC | PRN
Start: 2017-02-09 — End: 2017-02-09
  Administered 2017-02-09: 2 mg via INTRAVENOUS
  Filled 2017-02-09: qty 1

## 2017-02-09 MED ORDER — ATROPINE SULFATE 1 MG/10ML IJ SOSY
PREFILLED_SYRINGE | INTRAMUSCULAR | Status: AC
  Filled 2017-02-09: qty 10

## 2017-02-09 MED ORDER — MORPHINE SULFATE (PF) 4 MG/ML IV SOLN: 2.0000 mg | INTRAVENOUS | Status: DC | PRN

## 2017-02-09 MED ORDER — GLYCOPYRROLATE 1 MG PO TABS: 1.0000 mg | ORAL_TABLET | ORAL | Status: DC | PRN

## 2017-02-10 LAB — TYPE AND SCREEN
ABO/RH(D): A POS
ANTIBODY SCREEN: NEGATIVE
UNIT DIVISION: 0
UNIT DIVISION: 0
UNIT DIVISION: 0
Unit division: 0

## 2017-02-10 LAB — BPAM RBC
BLOOD PRODUCT EXPIRATION DATE: 201803252359
BLOOD PRODUCT EXPIRATION DATE: 201803252359
BLOOD PRODUCT EXPIRATION DATE: 201803272359
Blood Product Expiration Date: 201803252359
ISSUE DATE / TIME: 201802271656
ISSUE DATE / TIME: 201802280648
ISSUE DATE / TIME: 201802272335
ISSUE DATE / TIME: 201802280756
UNIT TYPE AND RH: 6200
UNIT TYPE AND RH: 6200
Unit Type and Rh: 6200
Unit Type and Rh: 6200

## 2017-02-10 NOTE — Progress Notes (Signed)
Blood given back via CRRT and CRRT stopped.  Blood pressure remains very low and neo-synephrine titrated up to 400 mcg/min which is max dose. Unresponsive on bipap, taking occasional agonal breaths over bipap's set rate. Pupils fixed and dilated at 7 mm bilaterally.  RN at bedside with charge nurse and Rayfield Citizenaroline, RN assisting with patient's care.

## 2017-02-10 NOTE — Progress Notes (Signed)
Morphine and ativan given PRN per comfort care orders per Dr. Nicholos Johnsamachandran.  Bipap removed and patient placed on 2 L nasal cannula for comfort. All vasopressors stopped.  Brother, girlfriend, and father at bedside with chaplain.

## 2017-02-10 NOTE — Progress Notes (Signed)
2342 rinsed blood back via CRRT machine to perform routine filter change. CRRT reset up approximately 0100.

## 2017-02-10 NOTE — Progress Notes (Addendum)
Dr. Nicholos Johnsamachandran at bedside.  Patient bradied down to 3863 and Dr. Nicholos Johnsamachandran gave order for 0.5 atropine.  Atropine given.  Dopplered femoral pulse.  Patient unresponsive to deep sternal rub.  Patient's blood pressure continues to stay very low therefore MD gave order to titrate levophed up to 50 mcg/min and stated that is the max dose for levophed.  RN at bedside working with patient.

## 2017-02-10 NOTE — Care Management (Signed)
Post demise: [facility] updated on patient's demise.

## 2017-02-10 NOTE — Progress Notes (Signed)
[facility]  ROUNDING NOTE   Subjective:   Taken off CRRT due to hemodynamic instability. UF 2668. Net -  Norepi, phenylephrine and vasopressin  Status post 4 units PRBC  CPAP   Objective:  Vital signs in last 24 hours:  Temp:  [97 F (36.1 C)-98.3 F (36.8 C)] 97 F (36.1 C) (02/28 0825) Pulse Rate:  [58-145] 145 (02/28 0840) Resp:  [11-47] 15 (02/28 0915) BP: (35-113)/(14-73) 63/47 (02/28 0915) SpO2:  [94 %-100 %] 100 % (02/28 0840) FiO2 (%):  [32 %] 32 % (02/28 0825) Weight:  [140.4 kg (309 lb 8.4 oz)] 140.4 kg (309 lb 8.4 oz) (02/28 0452)  Weight change: -0.1 kg (-3.5 oz) Filed Weights   02/07/17 0419 02/08/17 0444 01/15/2017 0452  Weight: (!) 139.1 kg (306 lb 10.6 oz) (!) 140.5 kg (309 lb 11.9 oz) (!) 140.4 kg (309 lb 8.4 oz)    Intake/Output: I/O last 3 completed shifts: In: 1774.9 [P.O.:478; I.V.:896.9; IV Piggyback:400] Out: 3916 [Urine:47; Other:3869]   Intake/Output this shift:  Total I/O In: 1123.8 [I.V.:533.8; Blood:590] Out: -   Physical Exam: General: Critically ill  Head: +CPAP  Eyes: Anicteric  Neck: Supple  Lungs:  Crackles bilaterally  Heart: tachcyardia  Abdomen:  Soft, nontender, obese, distended   Extremities: 3+ dependent peripheral edema. Anasarca  Neurologic: Alert and oriented, following commands  Skin: Scattered bruising  Access: Right IJ temp HD catheter 2/9    Basic Metabolic Panel:  Recent Labs Lab 02/07/17 1341  02/08/17 0620 02/08/17 0901 02/08/17 1451 02/08/17 2153 01/24/2017 0408  NA  --   < > 134* 134* 130* 134* 136  137  K  --   < > 3.7 3.7 3.7 4.1 4.3  4.4  CL  --   < > 102 102 101 103 102  103  CO2  --   < > 28 26 24 23  20*  21*  GLUCOSE  --   < > 148* 176* 258* 162* 143*  142*  BUN  --   < > 20 20 20 20 19  19   CREATININE  --   < > 2.38* 2.34* 2.33* 2.26* 2.21*  2.37*  CALCIUM  --   < > 8.4* 8.2* 7.9* 8.1* 8.1*  8.2*  MG 1.7  --   --   --   --   --  1.5*  PHOS  --   < > 1.7* 1.6*  1.6* 2.1* 2.7  2.8  < > = values in this interval not displayed.  Liver Function Tests:  Recent Labs Lab 02/08/17 0620 02/08/17 0901 02/08/17 1451 02/08/17 2153 01/14/2017 0408  ALBUMIN 2.5* 2.5* 2.1* 2.6* 2.5*   No results for input(s): LIPASE, AMYLASE in the last 168 hours. No results for input(s): AMMONIA in the last 168 hours.  CBC:  Recent Labs Lab 02/04/17 0500 02/04/17 1630 02/06/17 0616 02/08/17 1312 02/08/17 2153 02/05/2017 0408  WBC 10.8* 11.0* 10.4 7.9  --  11.3*  HGB 8.1* 8.6* 8.8* 6.4* 6.6* 6.5*  HCT 24.2* 25.9* 25.8* 19.8* 19.9* 19.8*  MCV 106.8* 106.6* 105.1* 109.6*  --  103.4*  PLT 68* 78* 66* 37*  --  39*    Cardiac Enzymes: No results for input(s): CKTOTAL, CKMB, CKMBINDEX, TROPONINI in the last 168 hours.  BNP: Invalid input(s): POCBNP  CBG:  Recent Labs Lab 02/08/17 0710 02/08/17 1116 02/08/17 1744 02/08/17 2151 01/14/2017 0838  GLUCAP 153* 130* 131* 129* 84    Microbiology: Results for orders placed or performed  during the hospital encounter of September 09, 2017  Blood Culture (routine x 2)     Status: None   Collection Time: September 09, 2017 10:27 AM  Result Value Ref Range Status   Specimen Description BLOOD LEFT ARM  Final   Special Requests BOTTLES DRAWN AEROBIC AND ANAEROBIC ANA2ML AER3ML  Final   Culture NO GROWTH 5 DAYS  Final   Report Status 01/26/2017 FINAL  Final  C difficile quick scan w PCR reflex     Status: None   Collection Time: September 09, 2017 11:28 AM  Result Value Ref Range Status   C Diff antigen NEGATIVE NEGATIVE Final   C Diff toxin NEGATIVE NEGATIVE Final   C Diff interpretation No C. difficile detected.  Final  Gastrointestinal Panel by PCR , Stool     Status: None   Collection Time: September 09, 2017 11:28 AM  Result Value Ref Range Status   Campylobacter species NOT DETECTED NOT DETECTED Final   Plesimonas shigelloides NOT DETECTED NOT DETECTED Final   Salmonella species NOT DETECTED NOT DETECTED Final   Yersinia enterocolitica NOT  DETECTED NOT DETECTED Final   Vibrio species NOT DETECTED NOT DETECTED Final   Vibrio cholerae NOT DETECTED NOT DETECTED Final   Enteroaggregative E coli (EAEC) NOT DETECTED NOT DETECTED Final   Enteropathogenic E coli (EPEC) NOT DETECTED NOT DETECTED Final   Enterotoxigenic E coli (ETEC) NOT DETECTED NOT DETECTED Final   Shiga like toxin producing E coli (STEC) NOT DETECTED NOT DETECTED Final   Shigella/Enteroinvasive E coli (EIEC) NOT DETECTED NOT DETECTED Final   Cryptosporidium NOT DETECTED NOT DETECTED Final   Cyclospora cayetanensis NOT DETECTED NOT DETECTED Final   Entamoeba histolytica NOT DETECTED NOT DETECTED Final   Giardia lamblia NOT DETECTED NOT DETECTED Final   Adenovirus F40/41 NOT DETECTED NOT DETECTED Final   Astrovirus NOT DETECTED NOT DETECTED Final   Norovirus GI/GII NOT DETECTED NOT DETECTED Final   Rotavirus A NOT DETECTED NOT DETECTED Final   Sapovirus (I, II, IV, and V) NOT DETECTED NOT DETECTED Final  Blood Culture (routine x 2)     Status: None   Collection Time: September 09, 2017 11:28 AM  Result Value Ref Range Status   Specimen Description BLOOD RIGHT PICC  Final   Special Requests BOTTLES DRAWN AEROBIC AND ANAEROBIC ANA7ML AER9ML  Final   Culture NO GROWTH 5 DAYS  Final   Report Status 01/26/2017 FINAL  Final  Urine culture     Status: None   Collection Time: September 09, 2017 12:10 PM  Result Value Ref Range Status   Specimen Description URINE, RANDOM  Final   Special Requests NONE  Final   Culture   Final    NO GROWTH Performed at [facility], [address] [place], [place] [address]    Report Status 01/22/2017 FINAL  Final  MRSA PCR Screening     Status: None   Collection Time: September 09, 2017  6:07 PM  Result Value Ref Range Status   MRSA by PCR NEGATIVE NEGATIVE Final    Comment:        The GeneXpert MRSA Assay (FDA approved for NASAL specimens only), is one component of a comprehensive MRSA colonization surveillance program. It is not intended to  diagnose MRSA infection nor to guide or monitor treatment for MRSA infections.   C difficile quick scan w PCR reflex     Status: None   Collection Time: 02/03/17  4:26 PM  Result Value Ref Range Status   C Diff antigen NEGATIVE NEGATIVE Final  C Diff toxin NEGATIVE NEGATIVE Final   C Diff interpretation No C. difficile detected.  Final    Coagulation Studies:  Recent Labs  02/08/17 1416 01/26/2017 0408  LABPROT 24.8* 31.0*  INR 2.20 2.91    Urinalysis:  Recent Labs  02/08/17 1931  COLORURINE RED*  LABSPEC 1.028  PHURINE TEST NOT REPORTED DUE TO COLOR INTERFERENCE OF URINE PIGMENT  GLUCOSEU TEST NOT REPORTED DUE TO COLOR INTERFERENCE OF URINE PIGMENT*  HGBUR TEST NOT REPORTED DUE TO COLOR INTERFERENCE OF URINE PIGMENT*  BILIRUBINUR TEST NOT REPORTED DUE TO COLOR INTERFERENCE OF URINE PIGMENT*  KETONESUR TEST NOT REPORTED DUE TO COLOR INTERFERENCE OF URINE PIGMENT*  PROTEINUR TEST NOT REPORTED DUE TO COLOR INTERFERENCE OF URINE PIGMENT*  NITRITE TEST NOT REPORTED DUE TO COLOR INTERFERENCE OF URINE PIGMENT*  LEUKOCYTESUR TEST NOT REPORTED DUE TO COLOR INTERFERENCE OF URINE PIGMENT*      Imaging: No results found.   Medications:   . norepinephrine (LEVOPHED) Adult infusion 50 mcg/min (02/06/2017 0755)  . phenylephrine (NEO-SYNEPHRINE) Adult infusion 400 mcg/min (01/19/2017 0904)  . pureflow 3 each (01/17/2017 1610)  . vasopressin (PITRESSIN) infusion - *FOR SHOCK* 0.04 Units/min (02/05/2017 0135)   . albumin human  12.5 g Intravenous Q8H  . atropine      . cefTRIAXone (ROCEPHIN) IVPB 2 gram/50 mL D5W (Pyxis)  2 g Intravenous Q24H  . feeding supplement (ENSURE ENLIVE)  237 mL Oral TID BM  . feeding supplement (PRO-STAT SUGAR FREE 64)  30 mL Oral BID  . folic acid  1 mg Oral Daily  . hydrocerin   Topical BID  . insulin aspart  0-9 Units Subcutaneous TID WC  . magnesium sulfate 1 - 4 g bolus IVPB  2 g Intravenous Once  . mouth rinse  15 mL Mouth Rinse BID  .  metroNIDAZOLE  500 mg Oral Q8H  . midodrine  10 mg Oral TID WC  . pantoprazole  40 mg Oral BID AC  . phosphorus  500 mg Oral Q4H while awake  . sodium chloride flush  10-40 mL Intracatheter Q12H  . sucralfate  1 g Oral TID WC & HS   sodium chloride, heparin, ipratropium-albuterol, loperamide, ondansetron (ZOFRAN) IV, sodium chloride flush, zolpidem  Assessment/ Plan:  Mr. [name] is a 56 y.o. white male with decubitus ulcer, hypertension, congestive heart failure, atrial fibrillation, Alcoholic cirrhosis, depression who was admitted to [facility] from 1/14 to 2/2 for sepsis. He was discharged with metronidazole and ceftriaxone for a total of six weeks. Patient was admitted to [facility] on 01-25-17 with sepsis.   1. Acute renal failure:  Baseline creatinine of 0.86 on 01/11/17 CRRT on 2/9-2/15, followed by IHD.  Back on CRRT 2/25-2/27 - unable to tolerate renal replacement therapy. Overall prognosis is poor. Have discussed case with family in detail.   2.  Alcoholic cirrhosis, Anasarca/Edema - 3rd spacing with hypoalbuminemia.  - IV albumin   3. Hyponatremia: secondary to volume, liver failure and renal failure.   4. Hypotension with sepsis: secondary to decubitus ulcer - on vasopressors: norepinephrine, phenylephrine and vasopressin - cefriaxone and metronidazole  5. Anemia with renal failure: macrocytic. With low platelets. Secondary to ongoing alcohol.  On going GI bleed with melana.    LOS: 19 Sim [place] 2/28/20189:37 AM

## 2017-02-10 NOTE — Progress Notes (Addendum)
[facility] [place] at [facility]   PATIENT NAME: [name] BrasMichael Vaeth    MR#:  [id number]  DATE OF BIRTH:  06/12/1961  SUBJECTIVE:   The patient is unresponsive, he had several large melena last night, Hb is 6.5 after 2 units of PRBC transfusion, on BIPAP,  on levophed and vasopressin drip. Oliguria. BP 47/27, HR 140's.  REVIEW OF SYSTEMS:    Review of Systems  Unable to perform ROS: Mental status change    DRUG ALLERGIES:   Allergies  Allergen Reactions  . Clindamycin/Lincomycin Diarrhea    VITALS:  Blood pressure (!) 47/27, pulse (!) 145, temperature 97 F (36.1 C), temperature source Axillary, resp. rate 14, height 6\' 4"  (1.93 m), weight (!) 309 lb 8.4 oz (140.4 kg), SpO2 100 %.  PHYSICAL EXAMINATION:   Physical Exam  Constitutional: No distress.  Anasarca, morbid obese. Critical ill-looking. unresponsive  HENT:  Head: Normocephalic.  Right IJ and PICC  Eyes: No scleral icterus.  Pupils are not reactive to light.  Neck: No JVD present. No tracheal deviation present.  Cardiovascular: Normal rate and normal heart sounds.  Exam reveals no gallop and no friction rub.   No murmur heard. Tachycardia.  Pulmonary/Chest: He has no wheezes. He has no rales.  Diminished lung sounds  Abdominal: Soft. Bowel sounds are normal. He exhibits distension. He exhibits no mass. There is no tenderness. There is no rebound and no guarding.  Positive for ascites sign.  Musculoskeletal: He exhibits edema.  Anasarca.Bilateral edema with chronic skin changes.  Neurological:  Unresponsive.  Skin: No rash noted. No erythema.   LABORATORY PANEL:   CBC  Recent Labs Lab 05/07/2017 0408  WBC 11.3*  HGB 6.5*  HCT 19.8*  PLT 39*   ------------------------------------------------------------------------------------------------------------------  Chemistries   Recent Labs Lab 05/07/2017 0408  NA 136  137  K 4.3  4.4  CL 102  103  CO2 20*  21*  GLUCOSE 143*   142*  BUN 19  19  CREATININE 2.21*  2.37*  CALCIUM 8.1*  8.2*  MG 1.5*   ------------------------------------------------------------------------------------------------------------------  Cardiac Enzymes No results for input(s): TROPONINI in the last 168 hours. ------------------------------------------------------------------------------------------------------------------  RADIOLOGY:  No results found.   ASSESSMENT AND PLAN:    56 year old male with EtOH related liver cirrhosis and portal gastropathy who was recently discharged for sacral decubitus presents with septic shock due to infected sacral decubitus and acute kidney failure due to sepsis.   1. Acute hypoxic respiratory failure in the setting of pulmonary vascular congestion due to renal failure Continue BIPAP for now.  2. Acute kidney failure with anuria received CRT from February 9 - 15th. Renal failure unfortunately persists. If his acute renal failure is prolonged he will likely need treatment and outpatient dialysis center. Per Dr. Wynelle LinkKolluru, no CRRT due to critical condition and poor prognosis.  3. EtOH liver cirrhosis with anasarca and portal hypertensive gastropathy with slow GI bleed: Continue  PPI and Carafate. FOTB positive. Octreotide stopped.  Acute blood loss due to GIB.  hemoglobin decreased to 6.4 yesterdayt, given 2 units PRBC, but Hb is still low at 6.5 due to acute GIB, give another 2 units per Dr. Nicholos Johnsamachandran.  Ascites. Need paracentesis if hypotension is better.  4. Chronic thrombocytopenia due to problem #3.  5. Septic shock from infected sacral decubitus (d/c from hospital on 2/9 with CTX and flagyl): Continue ceftriaxone and Flagyl as per ID recommendations   Hypotension.  pressor drip and midodrine.    LTAC disposition  is declined by insurance per SW and CM. Management plans discussed with the patient and he is in agreement.   The patient is very critical, high risk for  cardiopulmonary arrest and has very poor prognosis.  He needs comfort care.  Family members agrees to PRBC transfusion now and possible comfort care if patient's condition worsen.  Discussed with Dr. Wynelle Link and Dr. Nicholos Johns. Discussed with patient's girl friend, she voiced understanding, waiting for other family members to come.  CODE STATUS: DNR.  TOTAL CRITICAL TIME TAKING CARE OF THIS PATIENT: 45 minutes.   Shaune Pollack M.D on 02-28-17 at 8:52 AM  Between 7am to 6pm - Pager - [phone] After 6pm go to [email or web address] - password [email or web address]  [facility]  Office  [phone]  CC: Primary care physician; Corky Downs, MD  Note: This dictation was prepared with Dragon dictation along with smaller phrase technology. Any transcriptional errors that result from this process are unintentional.

## 2017-02-10 NOTE — Discharge Summary (Signed)
   [facility] [place] at [facility]    Death Note     Death Note please see Last Note for all details.   In breif -    [name] BrasMichael Blalock CSN:[id number],MRN:[id number] is a 56 y.o. male, Outpatient Primary MD for the patient is MASOUD,[name], MD  Pronounced dead by RN 02/01/2017     @ 10:58                  Cause of death   Acute hypoxic respiratory failure in the setting of pulmonary vascular congestion due to renal failure  Acute kidney failure  Septic shock from infected sacral decubitus  Acute blood loss due to GIB.  EtOH liver cirrhosis with anasarca and portal hypertensive gastropathy with GI bleed   Shaune Pollackhen, Rayford Williamsen M.D on 02/08/2017 at 1:00 PM  [facility] - [place] at [facility]    OFFICE [phone]  Total clinical and documentation time for today Under 30 minutes   Last Note   56 year old male with EtOH related liver cirrhosis and portal gastropathy who was recently discharged for sacral decubitus presents with septic shock due to infected sacral decubitus and acute kidney failure due to sepsis.   1. Acute hypoxic respiratory failure in the setting of pulmonary vascular congestion due to renal failure Continue BIPAP for now.  2. Acute kidney failure with anuria received CRT from February 9 - 15th. Renal failure unfortunately persists. If his acute renal failure is prolonged he will likely need treatment and outpatient dialysis center. Per Dr. Wynelle LinkKolluru, no CRRT due to critical condition and poor prognosis.  3. EtOH liver cirrhosis with anasarca and portal hypertensive gastropathy with slow GI bleed: Continue  PPI and Carafate. FOTB positive. Octreotide stopped.  Acute blood loss due to GIB.  hemoglobin decreased to 6.4 yesterdayt, given 2 units PRBC, but Hb is still low at 6.5 due to acute GIB, give another 2 units per Dr. Nicholos Johnsamachandran.  Ascites. Need paracentesis if hypotension is better.  4. Chronic thrombocytopenia due  to problem #3.  5. Septic shock from infected sacral decubitus (d/c from hospital on 2/9 with CTX and flagyl): Continue ceftriaxone and Flagyl as per ID recommendations   Hypotension.  pressor drip and midodrine.    LTAC disposition is declined by insurance per SW and CM. Management plans discussed with the patient and he is in agreement.   The patient is very critical, high risk for cardiopulmonary arrest and has very poor prognosis.  He needs comfort care.  Family members agrees to PRBC transfusion now and possible comfort care if patient's condition worsen.  Discussed with Dr. Wynelle LinkKolluru and Dr. Nicholos Johnsamachandran. Discussed with patient's girl friend, she voiced understanding.  The patient was put on comfort care. Bipap removed and patient placed on 2 L nasal cannula for comfort. All vasopressors stopped.  Brother, girlfriend, and father at bedside with chaplain.  The patient expired at 10:58.

## 2017-02-10 NOTE — Consult Note (Signed)
Provided spiritual and emotional support to family at time of death.

## 2017-02-10 NOTE — Progress Notes (Signed)
CH responded to an OR for End of Life. Pt had expired (10:58) before my arrival. Family had already left. CH rounded to see if any other family was present, but all had left.     27-Sep-2017 1400  Clinical Encounter Type  Visited With Patient not available  Visit Type Death  Referral From Nurse

## 2017-02-10 NOTE — Progress Notes (Signed)
RN called [name] with CDS and notified him of patient's time of death of 1058.  Patient is a full release.

## 2017-02-10 NOTE — Progress Notes (Signed)
Dr. Nicholos Johnsamachandran present and gave order for comfort measures. Family at bedside.

## 2017-02-10 NOTE — Progress Notes (Addendum)
Rn noted weak, thready pulses with low BP of 51/20.  Pt noted to have malena overnight, now receiving 3 of 4 units of blood. Pt now unresponsive on bipap. [facility]   --Stop CRRT.  --Increase levophed to 50 mcs, phenylephrine and vasopressin already at max dose.  --Bolus 3rd unit of blood.  --Obtain 4th unit of blood and bolus.  --Give atropine 0.5 mg ivp.   Will monitor closely, pt as high risk of decompensation and death.   Wells Guiles-Deep Hulet Ehrmann, M.D.  2017-03-19  Addendum.  Discussed findings with pt's girlfriend,brother explained dire prognosis, agreeable to DNR status.   Addendum 9:50 am.  S/p 4 units of PRBC given emergenty/rapidly  with no appreciable improvement in BP, no remains 40's to 50's SBP, pt remains unresponsive with bilateral rhonchi. Abdomen is increasingly distended with no appreciable bowel sounds.  Pt remains on levophed @ 50 mc, Vasopressin @0 .04 units, phenylephrine @300  mcs.   D/W nephrology, hospitalist, girlfriend, father, brother, ICU RN. Will change pt to comfort measures only. Family in agreement.   Wells Guiles-Deep Quante Pettry, M.D.  2017-03-19   Critical Care Attestation.  I have personally obtained a history, examined the patient, evaluated laboratory and imaging results, formulated the assessment and plan and placed orders. The Patient requires high complexity decision making for assessment and support, frequent evaluation and titration of therapies, application of advanced monitoring technologies and extensive interpretation of multiple databases. The patient has critical illness that could lead imminently to failure of 1 or more organ systems and requires the highest level of physician preparedness to intervene.  Critical Care Time devoted to patient care services described in this note is 60 minutes and is exclusive of time spent in procedures.

## 2017-02-10 NOTE — Progress Notes (Signed)
Dr. Nicholos Johnsamachandran at bedside and gave order to stop CRRT and to infuse current unit of blood as fast as possible and to also go get 4th unit of blood and to go ahead and start it and infuse it as fast as possible.    Dois DavenportSandra, RN called patient's friend Carlos [name] who is only contact listed and made her aware that patient is not doing well and that she should come.  Jacki ConesLaurie coming to hospital.

## 2017-02-10 NOTE — Progress Notes (Signed)
Asystole per cardiac monitor.  This RN and Rayfield Citizenaroline, RN went to bedside and assessed patient.  Both RN's ausculted for heart tones for a full minute each and none heard.  Time of death [id number].  RN notified Dr. Nicholos Johnsamachandran and Thurston HoleAnne, RN nursing supervisor of patient's death.  Family and chaplain along with girlfriend at bedside.

## 2017-02-10 NOTE — Progress Notes (Signed)
Both units of blood have finished infusing rapidly per [name]'s order at bedside.  Blood pressure remains very low on 400 mcg/min of neo-synephrine, 50 mcg/min of levophed and 0.04 units/min of vasopressin.  Patient remains unresponsive on bipap, taking occasional agonal breaths over bipap set rate.  Stach rate 130's per cardiac monitor.  Pupils fixed and dilated at 7 mm bilaterally.  [name] girlfriend [name], [name] and [name] at bedside at this time.  [name] aware that both units of blood have went in and of current blood pressure trends and MD stated he will go in and speak with family shortly.

## 2017-02-10 DEATH — deceased

## 2017-09-03 IMAGING — DX DG CHEST 1V PORT
1 series · 1 of 1 positions shown · non-contrast
Comparison: 01/21/2017

CLINICAL DATA: Central line placement.

EXAM:
PORTABLE CHEST 1 VIEW

[chest ap]
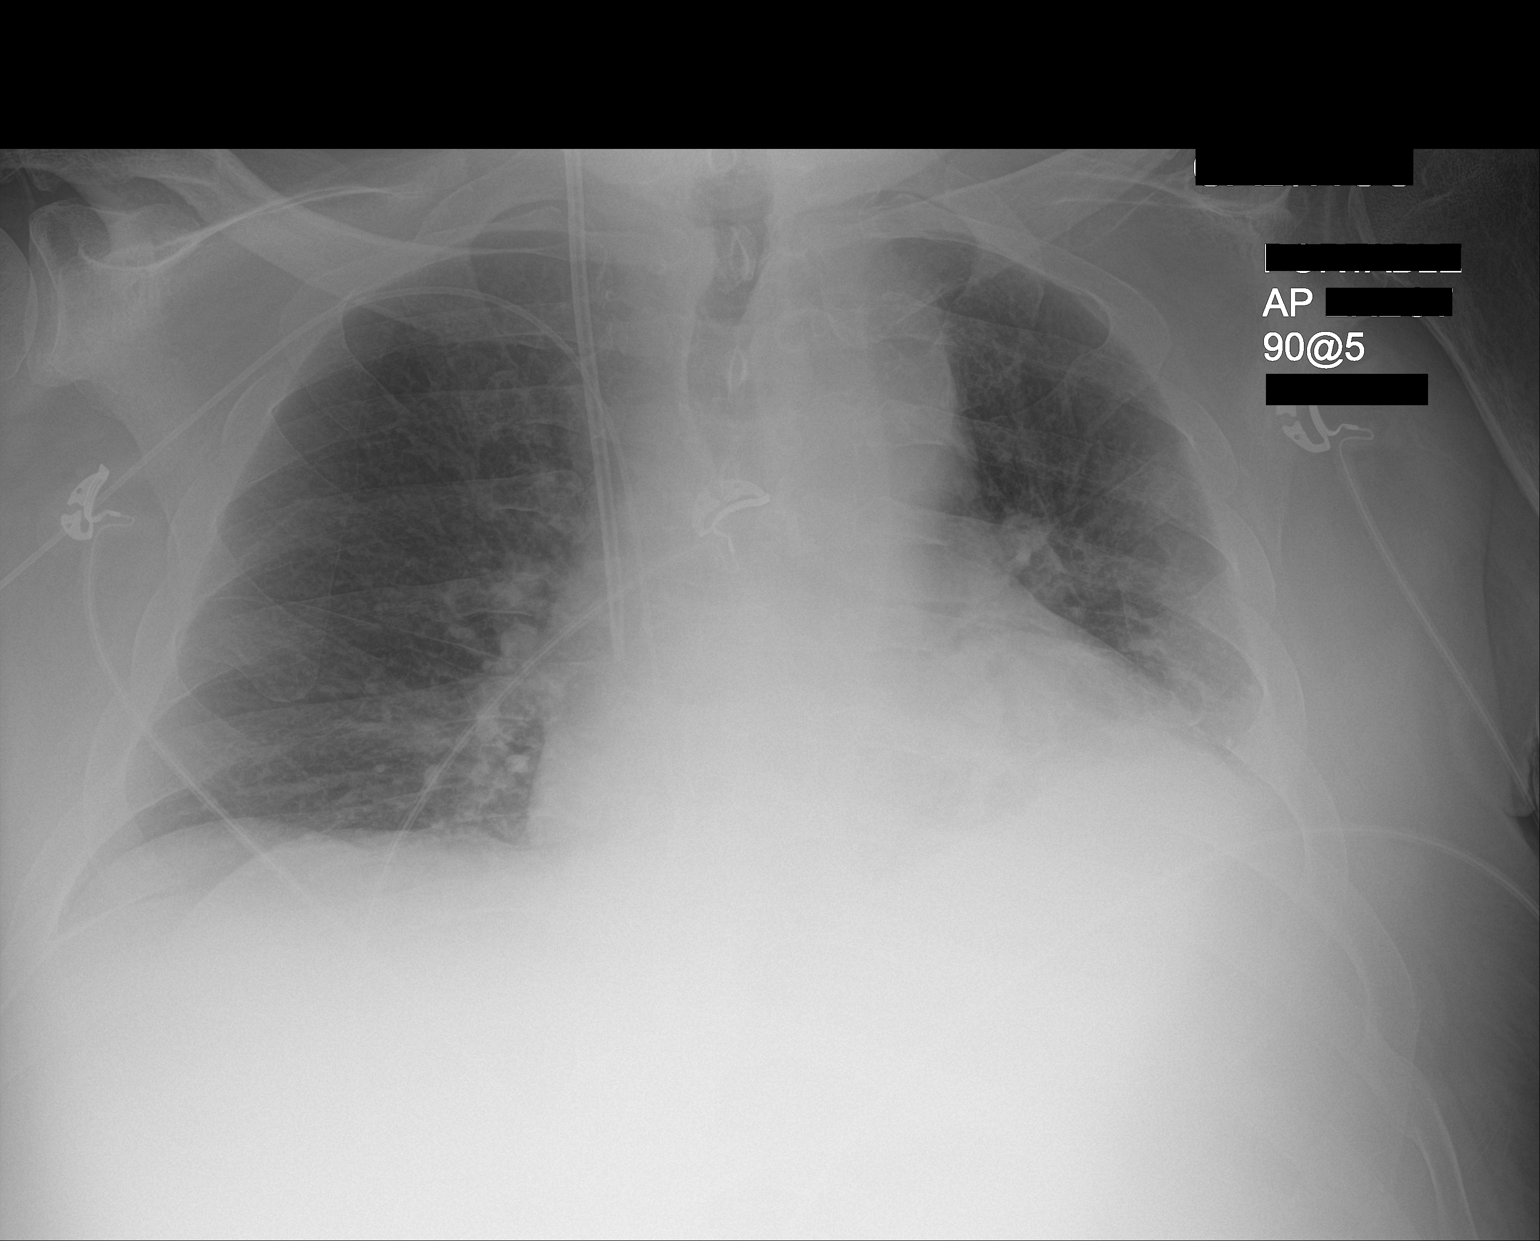

[1 of 1 positions shown; findings below may reference images not displayed]

FINDINGS: The tip of the right PICC is not well visualized due to superimposed
mediastinal and spine structures as well as underpenetration, though
it likely terminates in the right atrium. A new right jugular
catheter terminates over the lower SVC. The cardiac silhouette is
mildly enlarged. Pulmonary vascular congestion is stable to mildly
decreased. Lung volumes remain diminished with similar appearance of
left basilar opacity. No large pleural effusion or pneumothorax is
identified.
IMPRESSION: 1. New right jugular catheter terminating over the lower SVC.
2. Stable to slightly improved pulmonary vascular congestion.
3. Unchanged left basilar atelectasis versus pneumonia.

## 2017-09-07 IMAGING — DX DG CHEST 1V PORT
1 series · 1 of 1 positions shown · non-contrast
Comparison: Portable chest x-ray January 22, 2017

CLINICAL DATA: Respiratory failure, sepsis, CHF.

EXAM:
PORTABLE CHEST 1 VIEW

[chest ap]
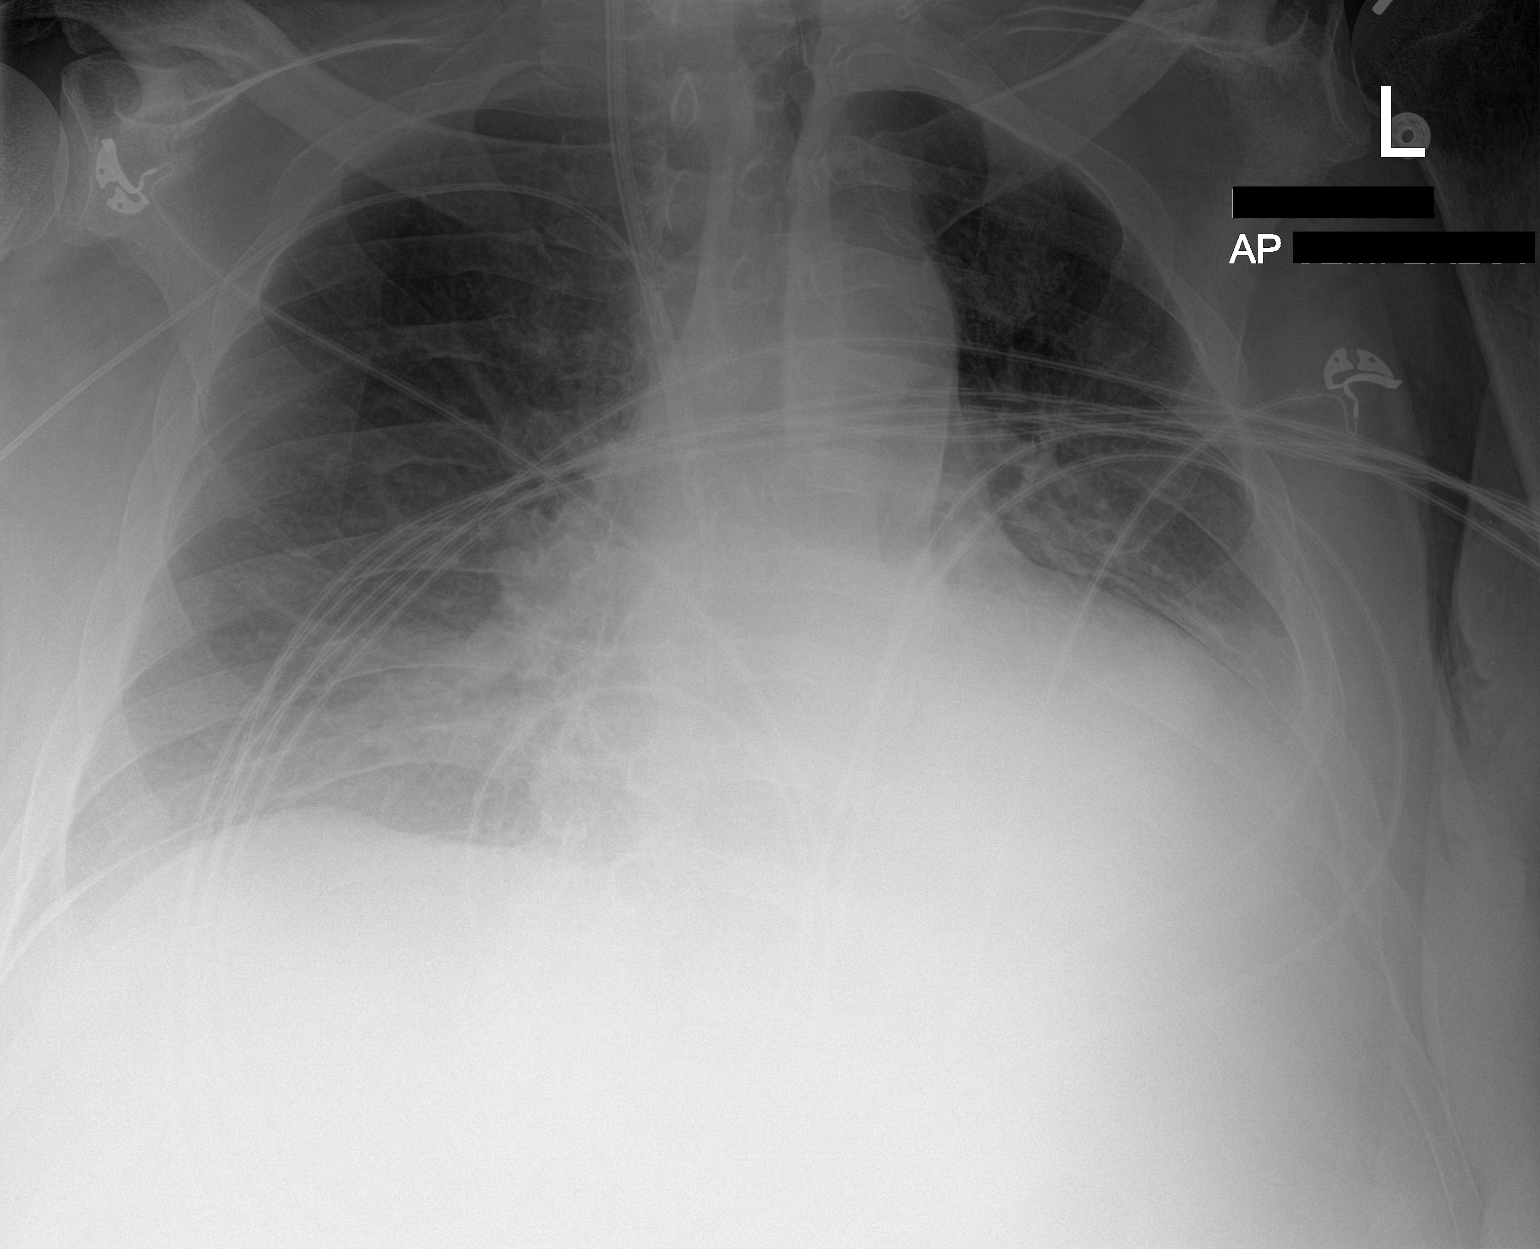

[1 of 1 positions shown; findings below may reference images not displayed]

FINDINGS: The lungs are adequately inflated. The retrocardiac region on the
left is more dense today with total obscuration of the
hemidiaphragm. There is increased density in the right infrahilar
region but the hemidiaphragm remains visible. The cardiac silhouette
is enlarged. The central pulmonary vascularity is prominent. The
right-sided PICC line tip in the right sided large caliber internal
jugular venous catheter tips project over the midportion of the SVC.
IMPRESSION: Worsening of left lower lobe atelectasis or pneumonia. Probable
small left pleural effusion.

## 2018-07-18 IMAGING — US US PARACENTESIS
1 series · 7 of 7 positions shown · non-contrast
Comparison: none

INDICATION: Cirrhosis and ascites.

[Series 1: us paracentesis · 0.26mm/px · 7 of 7 slices shown]
[im 1/7]
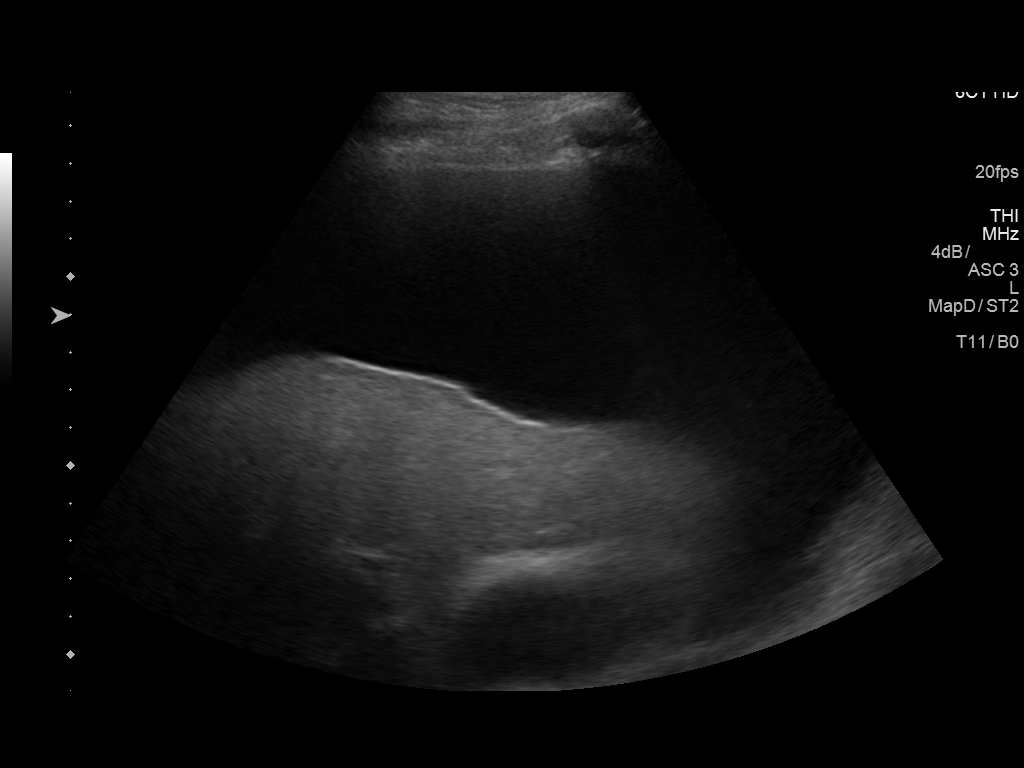
[im 2/7]
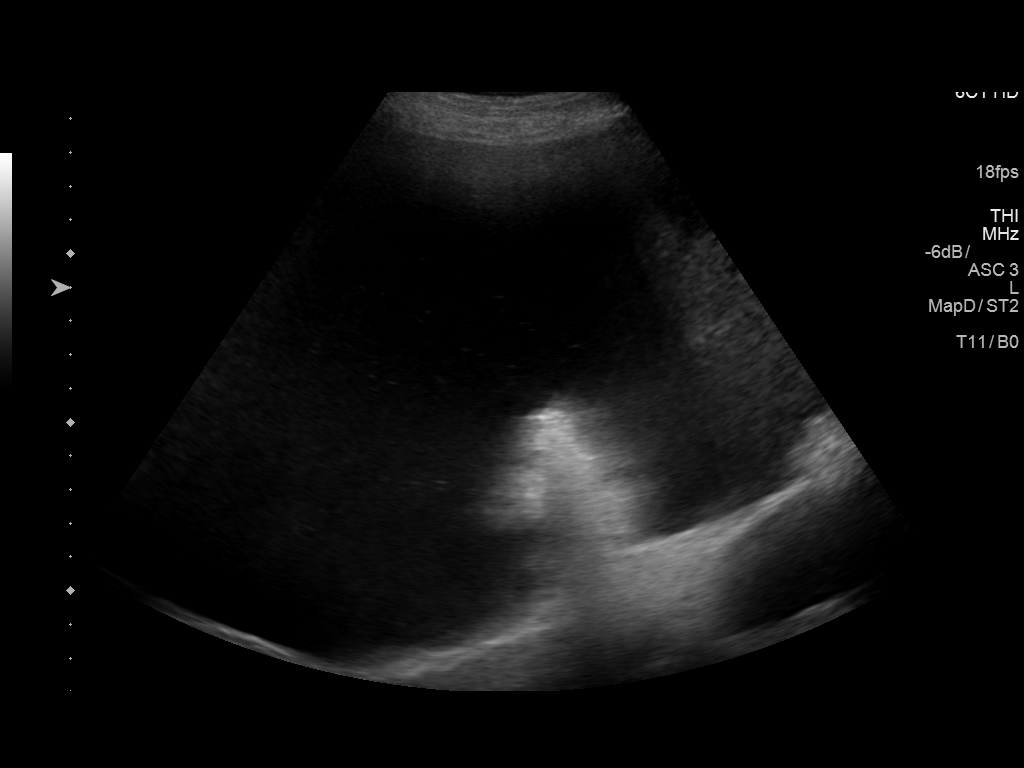
[im 3/7]
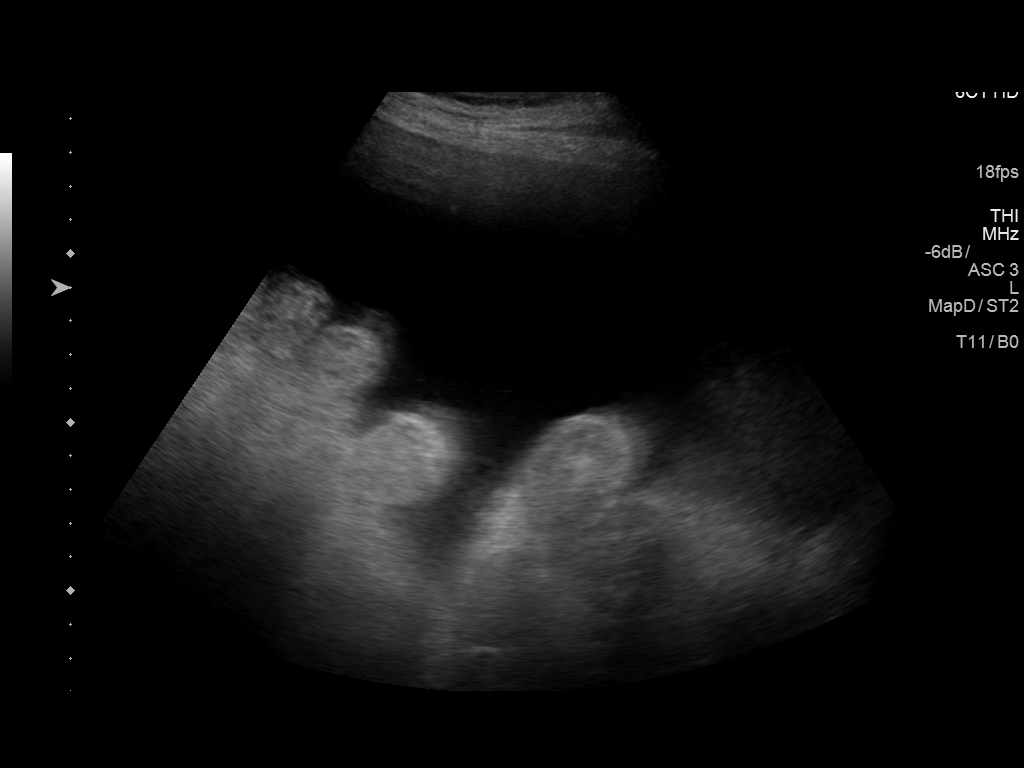
[im 4/7]
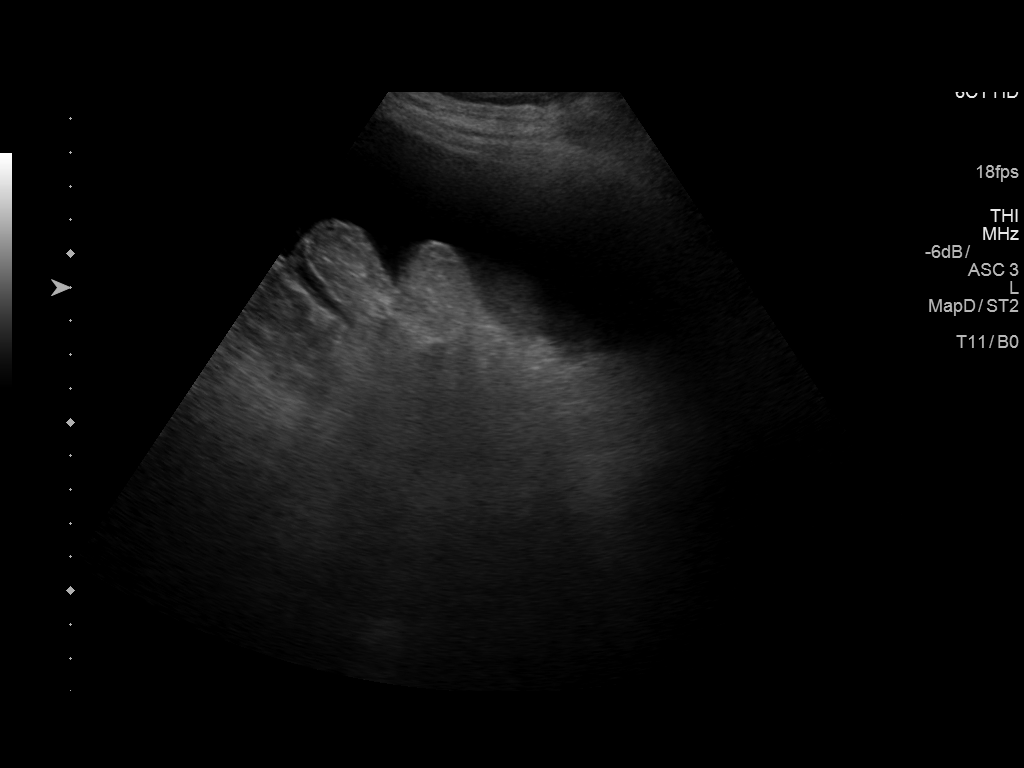
[im 5/7]
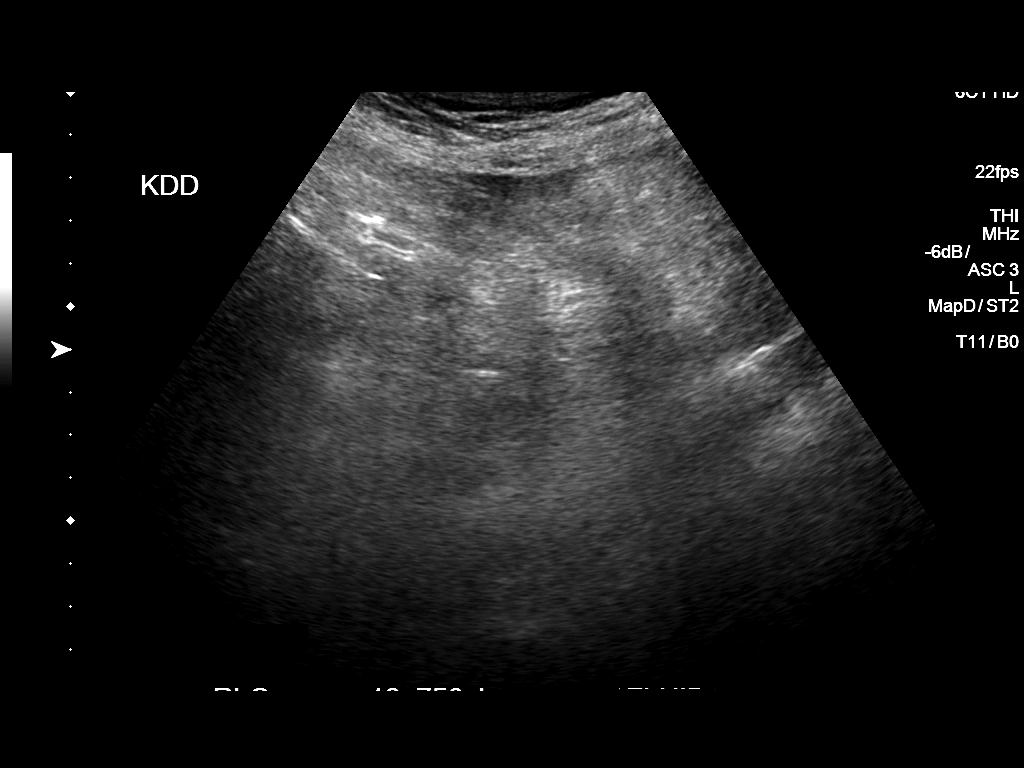
[im 6/7]
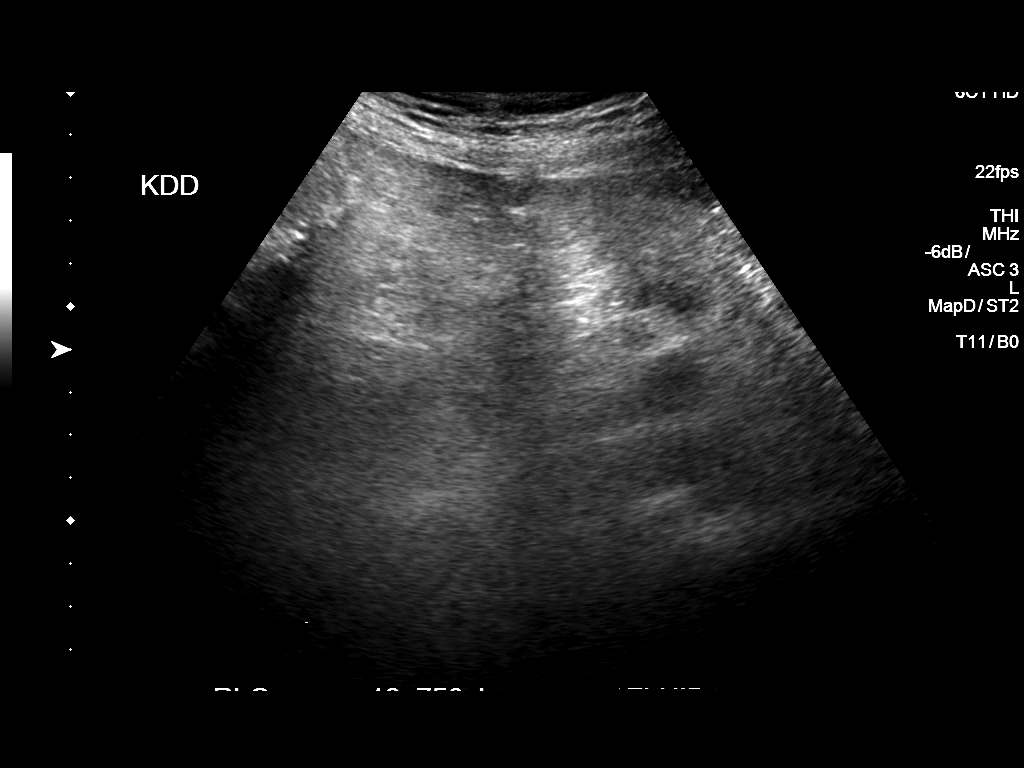
[im 7/7]
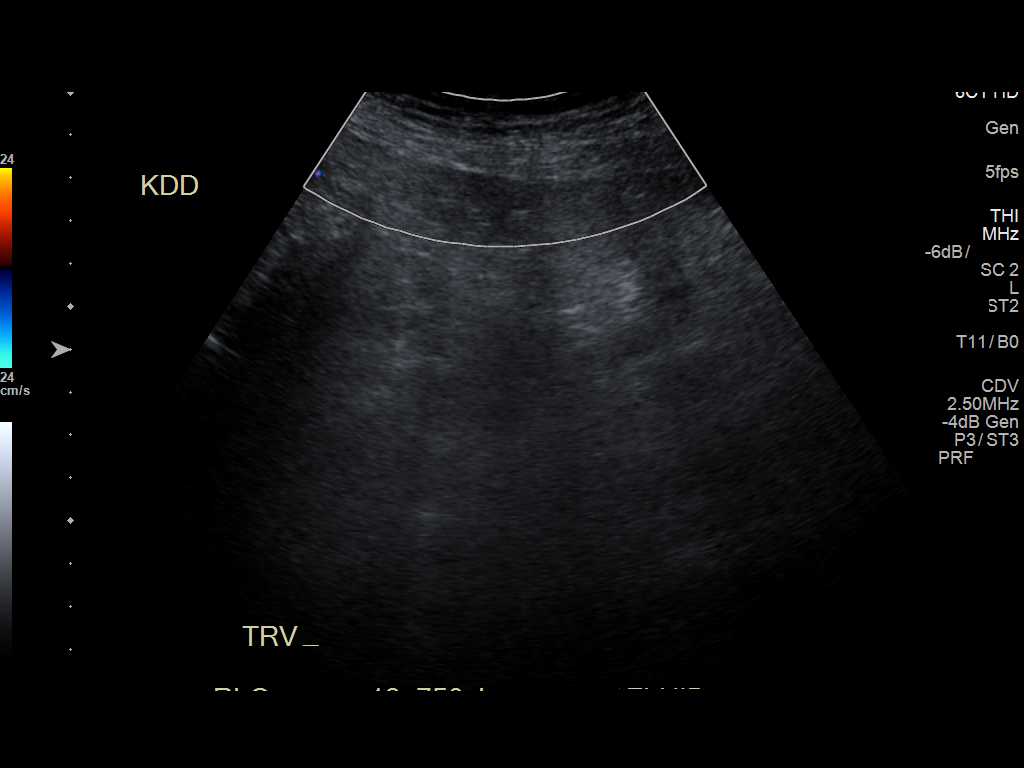

[7 of 7 positions shown; findings below may reference images not displayed]

EXAM:
ULTRASOUND GUIDED PARACENTESIS

MEDICATIONS:
None.

COMPLICATIONS:
None immediate.

PROCEDURE:
Informed written consent was obtained from the patient after a
discussion of the risks, benefits and alternatives to treatment. A
timeout was performed prior to the initiation of the procedure.

Initial ultrasound was used to localize ascites. The right lower
abdomen was prepped and draped in the usual sterile fashion. 1%
lidocaine was used for local anesthesia.

Following this, a 6 Fr Safe-T-Centesis catheter was introduced. An
ultrasound image was saved for documentation purposes. The
paracentesis was performed. The catheter was removed and a dressing
was applied. The patient tolerated the procedure well without
immediate post procedural complication.
FINDINGS: A total of approximately 13.8 L of yellow fluid was removed.
IMPRESSION: Successful ultrasound-guided paracentesis yielding 13.8 liters of
peritoneal fluid.

## 2018-08-06 IMAGING — US US RENAL
2 series · 14 of 25 positions shown · non-contrast
Comparison: CT 12/27/2016

CLINICAL DATA: Acute renal failure

EXAM:
RENAL / URINARY TRACT ULTRASOUND COMPLETE

[Series 1: us renal · 7 of 15 slices shown (1 of 2)]
[im 1/15]
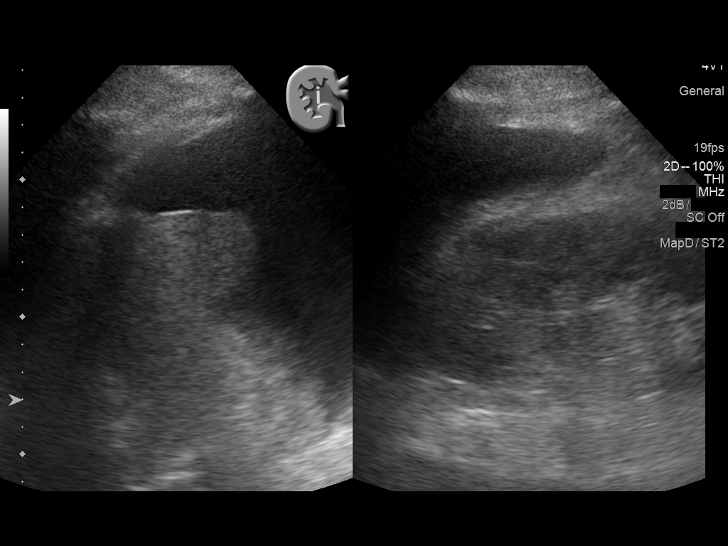
[im 3/15]
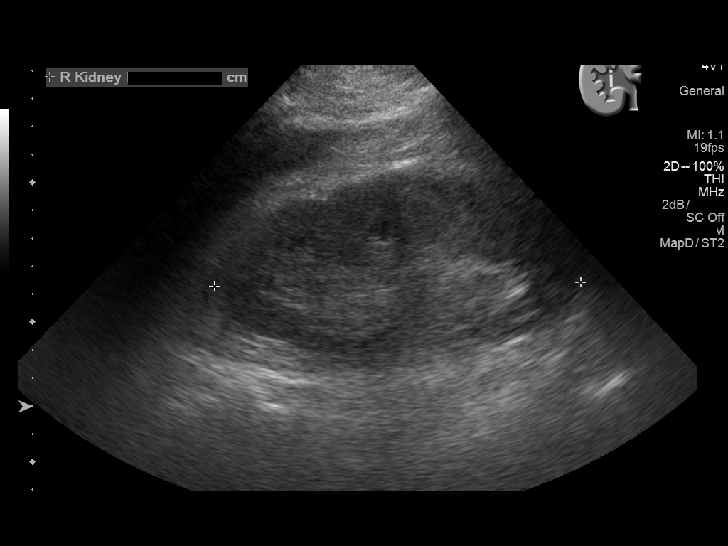
[im 6/15]
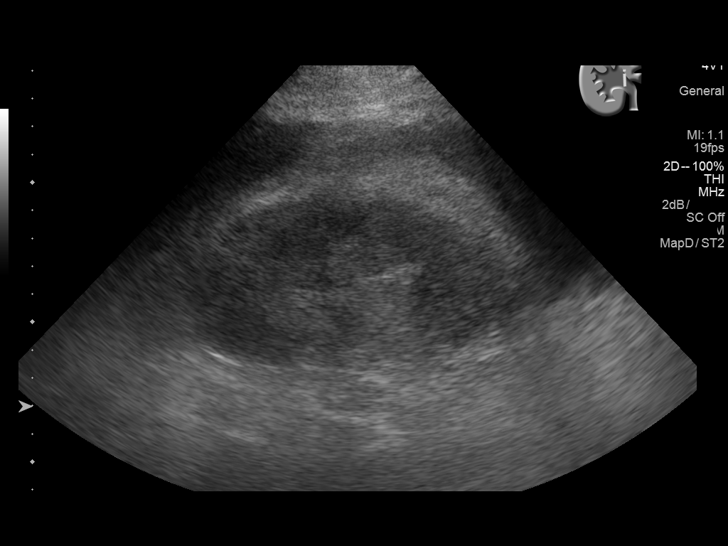
[im 8/15]
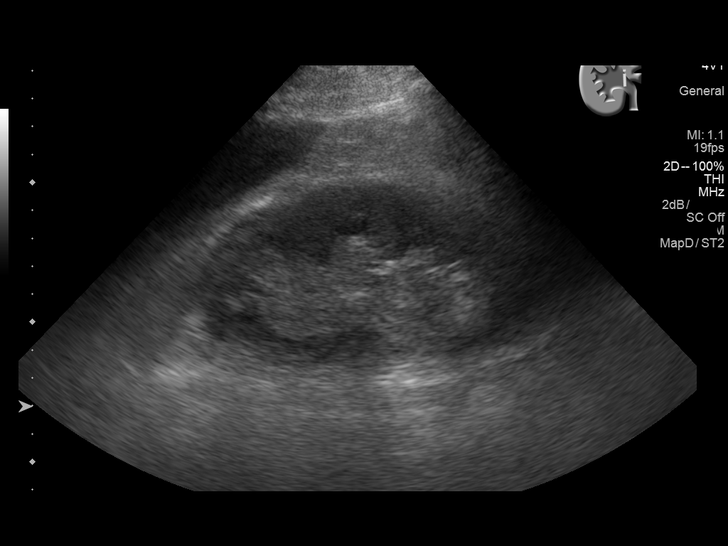
[im 11/15]
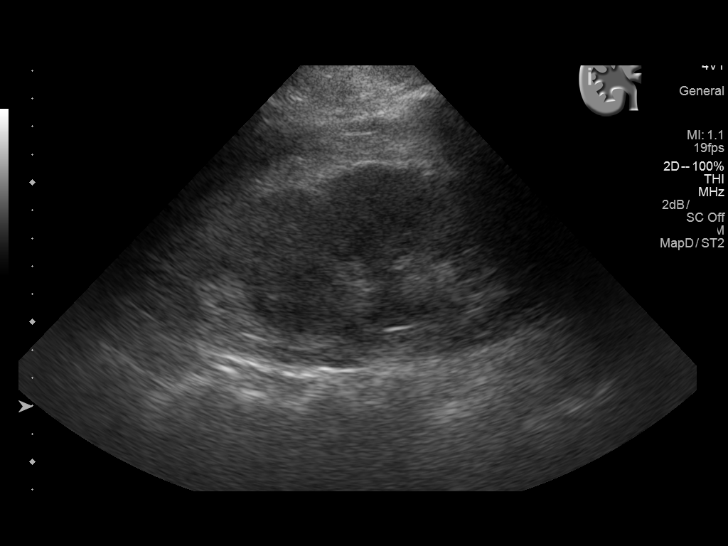
[im 12/15]
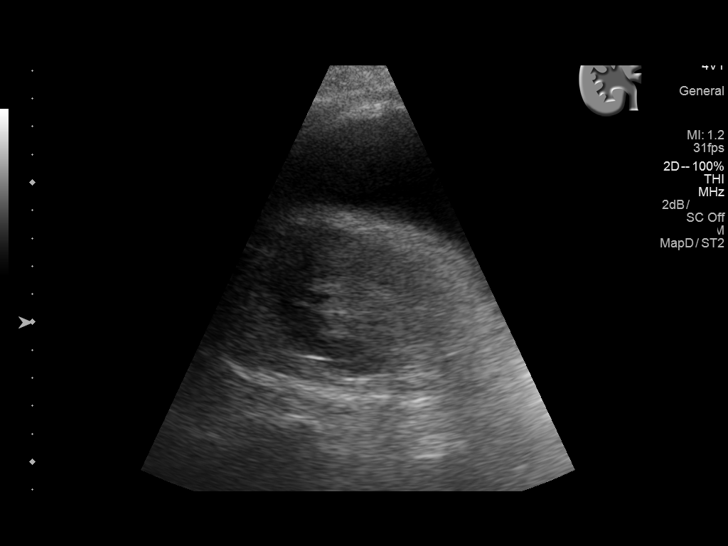
[im 15/15]
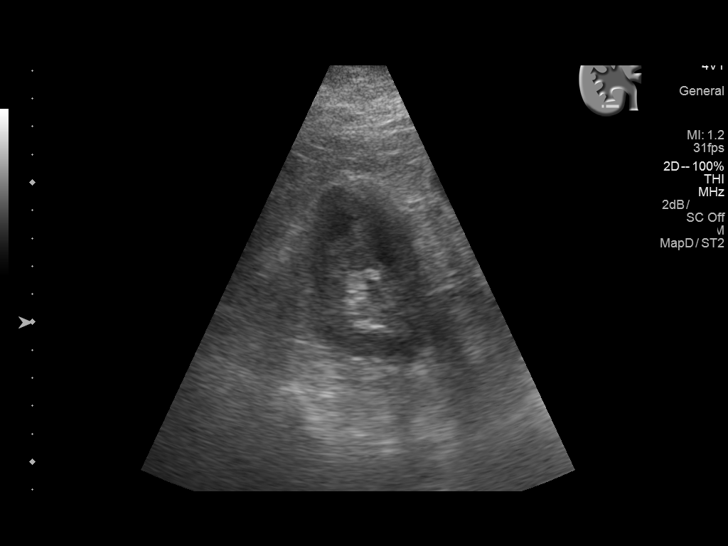

[Series 2001: us renal · 0.27mm/px · 7 of 16 slices shown (2 of 2)]
[im 2/16]
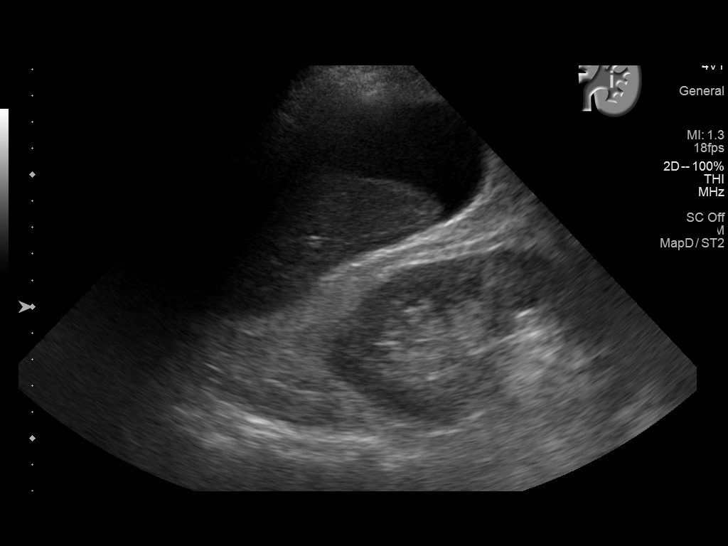
[im 4/16]
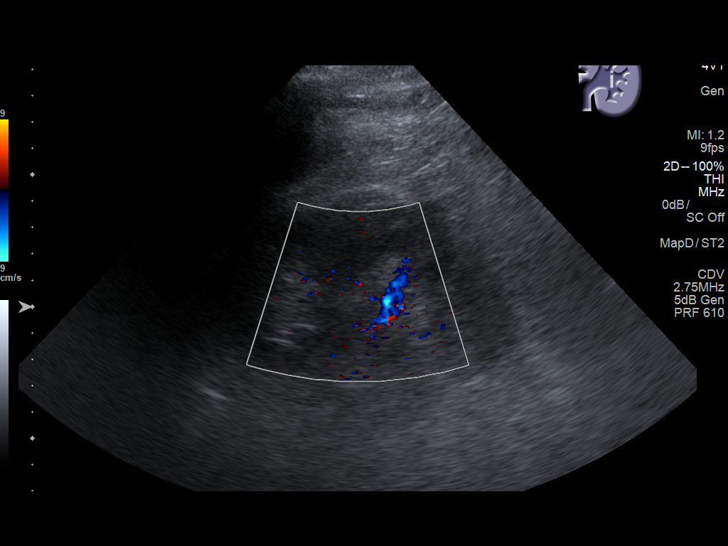
[im 6/16]
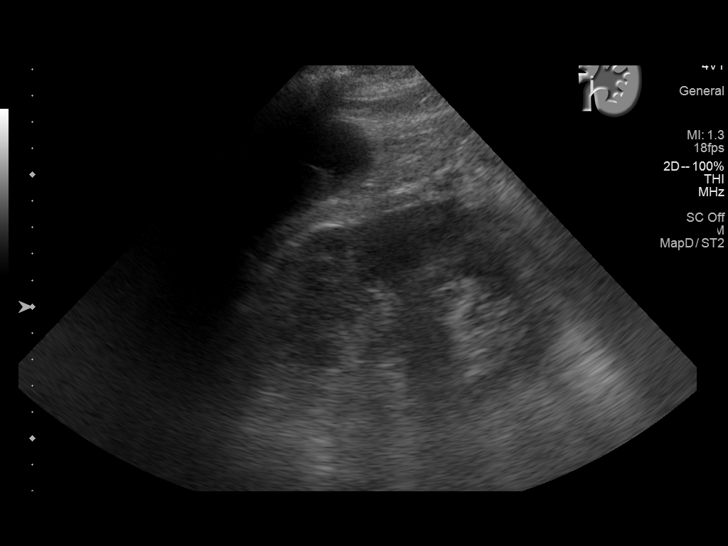
[im 8/16]
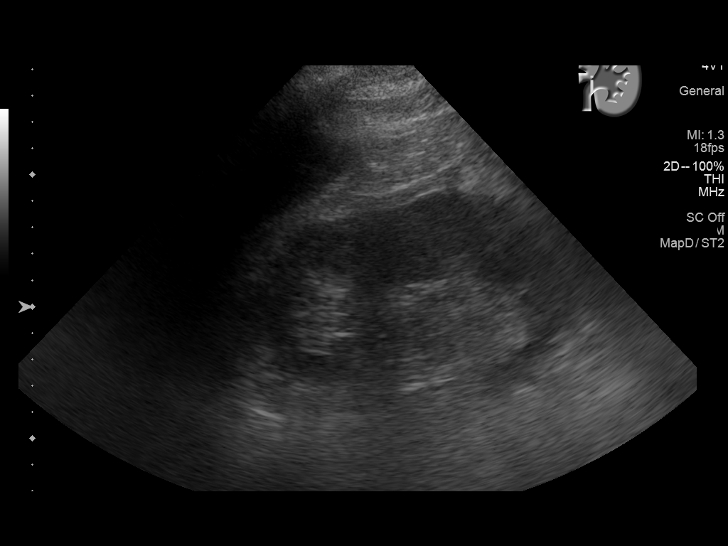
[im 11/16]
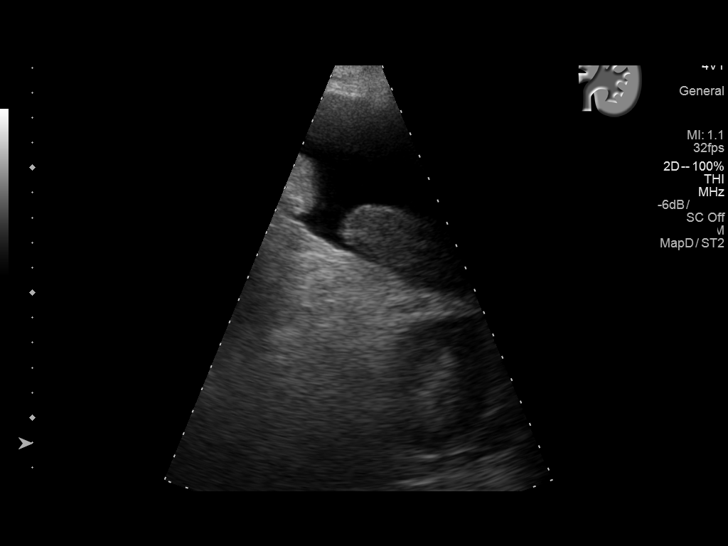
[im 13/16]
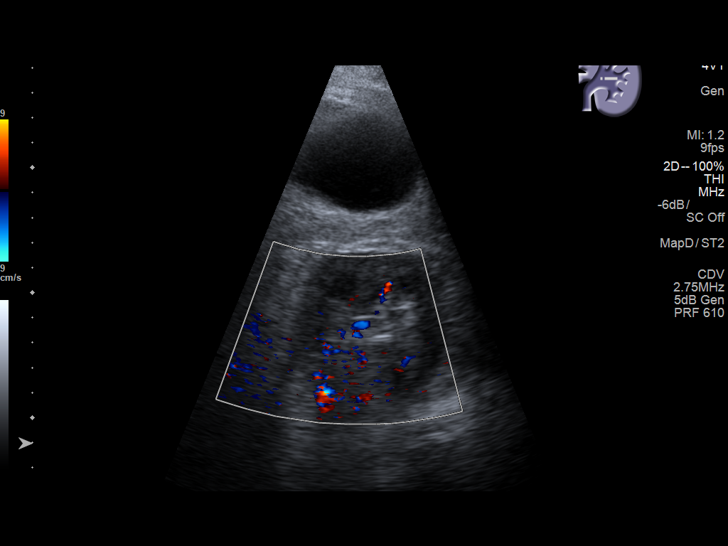
[im 16/16]
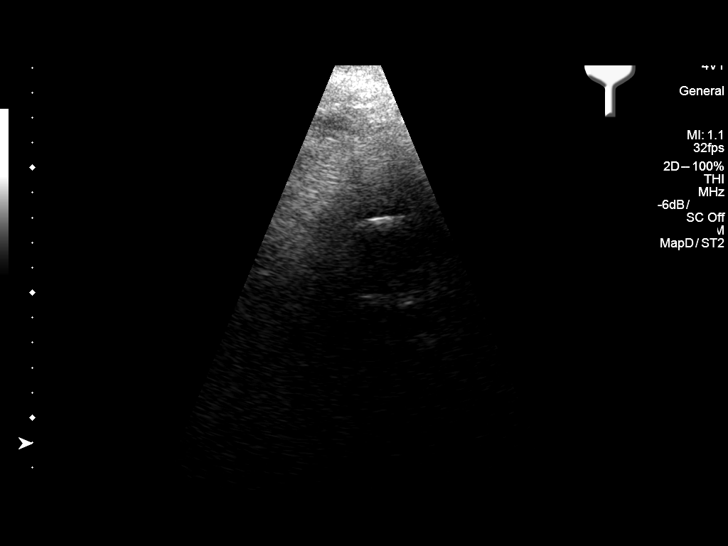

[14 of 25 positions shown; findings below may reference images not displayed]

FINDINGS: Right Kidney:

Length: 13.1 cm. Mildly increased echotexture. No mass or
hydronephrosis.

Left Kidney:

Length: 12.3 cm. Mildly increased echotexture. No mass or
hydronephrosis.

Bladder:

Decompressed with Foley catheter in place.
IMPRESSION: Mildly increased echotexture in the kidneys compatible with chronic
medical renal disease. No hydronephrosis.
